# Patient Record
Sex: Male | Born: 1955 | Race: White | Hispanic: No | Marital: Married | Smoking: Former smoker
Health system: Southern US, Community
[De-identification: ages and names within clinical notes are randomized; demographics above are authoritative.]

## PROBLEM LIST (undated history)

## (undated) DIAGNOSIS — K219 Gastro-esophageal reflux disease without esophagitis: Secondary | ICD-10-CM

## (undated) DIAGNOSIS — J8489 Other specified interstitial pulmonary diseases: Secondary | ICD-10-CM

## (undated) DIAGNOSIS — J984 Other disorders of lung: Secondary | ICD-10-CM

## (undated) DIAGNOSIS — R413 Other amnesia: Secondary | ICD-10-CM

## (undated) DIAGNOSIS — E119 Type 2 diabetes mellitus without complications: Secondary | ICD-10-CM

## (undated) DIAGNOSIS — R0602 Shortness of breath: Secondary | ICD-10-CM

## (undated) DIAGNOSIS — M810 Age-related osteoporosis without current pathological fracture: Secondary | ICD-10-CM

## (undated) DIAGNOSIS — J189 Pneumonia, unspecified organism: Secondary | ICD-10-CM

## (undated) DIAGNOSIS — M069 Rheumatoid arthritis, unspecified: Secondary | ICD-10-CM

## (undated) DIAGNOSIS — J309 Allergic rhinitis, unspecified: Secondary | ICD-10-CM

## (undated) DIAGNOSIS — Z860100 Personal history of colon polyps, unspecified: Secondary | ICD-10-CM

## (undated) DIAGNOSIS — R319 Hematuria, unspecified: Secondary | ICD-10-CM

## (undated) DIAGNOSIS — J449 Chronic obstructive pulmonary disease, unspecified: Secondary | ICD-10-CM

## (undated) DIAGNOSIS — R4182 Altered mental status, unspecified: Secondary | ICD-10-CM

## (undated) DIAGNOSIS — E018 Other iodine-deficiency related thyroid disorders and allied conditions: Secondary | ICD-10-CM

## (undated) DIAGNOSIS — M542 Cervicalgia: Secondary | ICD-10-CM

## (undated) DIAGNOSIS — F411 Generalized anxiety disorder: Secondary | ICD-10-CM

## (undated) DIAGNOSIS — F17200 Nicotine dependence, unspecified, uncomplicated: Secondary | ICD-10-CM

## (undated) DIAGNOSIS — I6990 Unspecified sequelae of unspecified cerebrovascular disease: Secondary | ICD-10-CM

## (undated) DIAGNOSIS — E7800 Pure hypercholesterolemia, unspecified: Secondary | ICD-10-CM

## (undated) DIAGNOSIS — K2970 Gastritis, unspecified, without bleeding: Secondary | ICD-10-CM

## (undated) DIAGNOSIS — Z7289 Other problems related to lifestyle: Secondary | ICD-10-CM

## (undated) DIAGNOSIS — H532 Diplopia: Secondary | ICD-10-CM

## (undated) DIAGNOSIS — E0590 Thyrotoxicosis, unspecified without thyrotoxic crisis or storm: Secondary | ICD-10-CM

## (undated) DIAGNOSIS — I739 Peripheral vascular disease, unspecified: Secondary | ICD-10-CM

## (undated) DIAGNOSIS — R7309 Other abnormal glucose: Secondary | ICD-10-CM

## (undated) DIAGNOSIS — I639 Cerebral infarction, unspecified: Secondary | ICD-10-CM

## (undated) DIAGNOSIS — E039 Hypothyroidism, unspecified: Secondary | ICD-10-CM

## (undated) HISTORY — DX: Allergic rhinitis, unspecified: J30.9

## (undated) HISTORY — DX: Age-related osteoporosis without current pathological fracture: M81.0

## (undated) HISTORY — DX: Peripheral vascular disease, unspecified: I73.9

## (undated) HISTORY — DX: Diplopia: H53.2

## (undated) HISTORY — DX: Hypothyroidism, unspecified: E03.9

## (undated) HISTORY — DX: Pneumonia, unspecified organism: J18.9

## (undated) HISTORY — DX: Generalized anxiety disorder: F41.1

## (undated) HISTORY — DX: Gastritis, unspecified, without bleeding: K29.70

## (undated) HISTORY — PX: OTHER SURGICAL HISTORY: SHX169

## (undated) HISTORY — DX: Other iodine-deficiency related thyroid disorders and allied conditions: E01.8

## (undated) HISTORY — DX: Personal history of colonic polyps: Z86.010

## (undated) HISTORY — DX: Nicotine dependence, unspecified, uncomplicated: F17.200

## (undated) HISTORY — DX: Hematuria, unspecified: R31.9

## (undated) HISTORY — DX: Type 2 diabetes mellitus without complications: E11.9

## (undated) HISTORY — DX: Other disorders of lung: J98.4

## (undated) HISTORY — DX: Other problems related to lifestyle: Z72.89

## (undated) HISTORY — DX: Gastro-esophageal reflux disease without esophagitis: K21.9

## (undated) HISTORY — DX: Personal history of colon polyps, unspecified: Z86.0100

## (undated) HISTORY — DX: Pure hypercholesterolemia, unspecified: E78.00

## (undated) HISTORY — DX: Cervicalgia: M54.2

## (undated) HISTORY — DX: Other amnesia: R41.3

## (undated) HISTORY — DX: Altered mental status, unspecified: R41.82

## (undated) HISTORY — DX: Other abnormal glucose: R73.09

## (undated) HISTORY — DX: Unspecified sequelae of unspecified cerebrovascular disease: I69.90

---

## 2003-08-23 DIAGNOSIS — I639 Cerebral infarction, unspecified: Secondary | ICD-10-CM

## 2003-08-23 HISTORY — DX: Cerebral infarction, unspecified: I63.9

## 2003-08-23 HISTORY — PX: BRAIN SURGERY: SHX531

## 2004-03-29 ENCOUNTER — Inpatient Hospital Stay (HOSPITAL_COMMUNITY): Admission: EM | Admit: 2004-03-29 | Discharge: 2004-04-21 | Payer: Self-pay | Admitting: Emergency Medicine

## 2004-09-27 ENCOUNTER — Ambulatory Visit (HOSPITAL_BASED_OUTPATIENT_CLINIC_OR_DEPARTMENT_OTHER): Admission: RE | Admit: 2004-09-27 | Discharge: 2004-09-27 | Payer: Self-pay | Admitting: Ophthalmology

## 2005-02-10 ENCOUNTER — Encounter: Admission: RE | Admit: 2005-02-10 | Discharge: 2005-04-27 | Payer: Self-pay | Admitting: Neurosurgery

## 2005-04-27 ENCOUNTER — Ambulatory Visit (HOSPITAL_COMMUNITY): Admission: RE | Admit: 2005-04-27 | Discharge: 2005-04-27 | Payer: Self-pay | Admitting: Neurosurgery

## 2006-03-17 ENCOUNTER — Ambulatory Visit (HOSPITAL_COMMUNITY): Admission: RE | Admit: 2006-03-17 | Discharge: 2006-03-17 | Payer: Self-pay | Admitting: Neurosurgery

## 2007-03-16 DIAGNOSIS — R319 Hematuria, unspecified: Secondary | ICD-10-CM

## 2007-03-16 DIAGNOSIS — F17200 Nicotine dependence, unspecified, uncomplicated: Secondary | ICD-10-CM

## 2007-03-16 DIAGNOSIS — Z7289 Other problems related to lifestyle: Secondary | ICD-10-CM

## 2007-03-16 DIAGNOSIS — F1090 Alcohol use, unspecified, uncomplicated: Secondary | ICD-10-CM

## 2007-03-16 HISTORY — DX: Other problems related to lifestyle: Z72.89

## 2007-03-16 HISTORY — DX: Alcohol use, unspecified, uncomplicated: F10.90

## 2007-03-16 HISTORY — DX: Nicotine dependence, unspecified, uncomplicated: F17.200

## 2007-03-16 HISTORY — DX: Hematuria, unspecified: R31.9

## 2007-06-08 DIAGNOSIS — I6990 Unspecified sequelae of unspecified cerebrovascular disease: Secondary | ICD-10-CM

## 2007-06-08 HISTORY — DX: Unspecified sequelae of unspecified cerebrovascular disease: I69.90

## 2007-09-17 DIAGNOSIS — M542 Cervicalgia: Secondary | ICD-10-CM

## 2007-09-17 HISTORY — DX: Cervicalgia: M54.2

## 2007-10-05 ENCOUNTER — Encounter: Admission: RE | Admit: 2007-10-05 | Discharge: 2007-10-05 | Payer: Self-pay | Admitting: Specialist

## 2007-10-12 ENCOUNTER — Encounter: Admission: RE | Admit: 2007-10-12 | Discharge: 2007-10-12 | Payer: Self-pay | Admitting: Specialist

## 2007-10-25 ENCOUNTER — Ambulatory Visit: Payer: Self-pay | Admitting: Family Medicine

## 2007-12-21 DIAGNOSIS — H532 Diplopia: Secondary | ICD-10-CM

## 2007-12-21 HISTORY — DX: Diplopia: H53.2

## 2008-04-08 DIAGNOSIS — R7309 Other abnormal glucose: Secondary | ICD-10-CM

## 2008-04-08 HISTORY — DX: Other abnormal glucose: R73.09

## 2008-06-13 ENCOUNTER — Encounter: Admission: RE | Admit: 2008-06-13 | Discharge: 2008-06-13 | Payer: Self-pay | Admitting: Specialist

## 2008-08-05 ENCOUNTER — Emergency Department (HOSPITAL_COMMUNITY): Admission: EM | Admit: 2008-08-05 | Discharge: 2008-08-06 | Payer: Self-pay | Admitting: Emergency Medicine

## 2008-08-22 HISTORY — PX: EYE SURGERY: SHX253

## 2008-09-16 ENCOUNTER — Ambulatory Visit: Payer: Self-pay | Admitting: Gastroenterology

## 2008-09-30 DIAGNOSIS — Z860100 Personal history of colon polyps, unspecified: Secondary | ICD-10-CM | POA: Insufficient documentation

## 2009-01-23 DIAGNOSIS — J984 Other disorders of lung: Secondary | ICD-10-CM

## 2009-01-23 HISTORY — DX: Other disorders of lung: J98.4

## 2009-02-13 ENCOUNTER — Ambulatory Visit: Payer: Self-pay | Admitting: Family Medicine

## 2009-04-23 ENCOUNTER — Ambulatory Visit: Payer: Self-pay | Admitting: Gastroenterology

## 2009-05-13 DIAGNOSIS — K2970 Gastritis, unspecified, without bleeding: Secondary | ICD-10-CM

## 2009-05-13 HISTORY — DX: Gastritis, unspecified, without bleeding: K29.70

## 2009-06-12 ENCOUNTER — Ambulatory Visit: Payer: Self-pay | Admitting: Family Medicine

## 2009-09-14 ENCOUNTER — Ambulatory Visit: Payer: Self-pay | Admitting: Pain Medicine

## 2009-09-21 ENCOUNTER — Ambulatory Visit: Payer: Self-pay | Admitting: Pain Medicine

## 2009-10-08 ENCOUNTER — Ambulatory Visit: Payer: Self-pay | Admitting: Pain Medicine

## 2009-12-24 DIAGNOSIS — I739 Peripheral vascular disease, unspecified: Secondary | ICD-10-CM

## 2009-12-24 HISTORY — DX: Peripheral vascular disease, unspecified: I73.9

## 2010-01-07 DIAGNOSIS — E039 Hypothyroidism, unspecified: Secondary | ICD-10-CM

## 2010-01-07 HISTORY — DX: Hypothyroidism, unspecified: E03.9

## 2010-06-14 ENCOUNTER — Ambulatory Visit: Payer: Self-pay

## 2010-06-22 ENCOUNTER — Inpatient Hospital Stay: Payer: Self-pay | Admitting: Internal Medicine

## 2010-07-19 ENCOUNTER — Inpatient Hospital Stay: Payer: Self-pay | Admitting: Internal Medicine

## 2011-01-07 NOTE — Op Note (Signed)
Kirk Robinson, Kirk Robinson                         ACCOUNT NO.:  [id number]   MEDICAL RECORD NO.:  [id number]                   PATIENT TYPE:  INP   LOCATION:  3021                                 FACILITY:  [facility]   PHYSICIAN:  Coletta Memos, M.D.                  DATE OF BIRTH:  [birth year]   DATE OF PROCEDURE:  04/22/2004  DATE OF DISCHARGE:  04/21/2004                                 OPERATIVE REPORT   PREOPERATIVE DIAGNOSES:  1.  Obstructive hydrocephalus.  2.  Left thalamic intracerebral hemorrhage.   POSTOPERATIVE DIAGNOSES:  1.  Obstructive hydrocephalus.  2.  Left thalamic intracerebral hemorrhage.   OPERATION PERFORMED:  Creation of left frontal ventriculoperitoneal shunt  using Codman programmable valve, antibiotic impregnated peritoneal catheter.   SURGEON:  Coletta Memos, M.D.   ASSISTANT:  Hilda Lias, M.D.   ANESTHESIA:  General.   INDICATIONS FOR PROCEDURE:  Kirk Robinson is a gentleman who presented in  early August with a left thalamic intracerebral hemorrhage.  As a result, he  had obstruction of the third ventricle necessitating ventricular catheter  placement and spinal fluid drainage.  He has not been able to do without the  ventricular catheter, so I therefore decided to place a left frontal  ventriculoperitoneal shunt.  Secondary to the low pressure, he needed to be  asymptomatic which was 0 cm H2O.  I decided to use a programmable shunt and  I set it at 90 initially.   DESCRIPTION OF PROCEDURE:  The patient was brought to the operating room  intubated and placed under general anesthetic.  He was positioned with his  hear turned towards the right and a shoulder roll under the left shoulder  and his chest, abdomen, neck and head were prepped with DuraPrep.  He was  draped sterilely.  He was infiltrated with 0.5% lidocaine 1:200,000 strength  epinephrine in the scalp postauricular region and just off the midline in  the left upper quadrant.  Incision was  made in the abdomen and I went  through the various layers of the superficial skin.  Then went through  Camper's fascia.  I identified the rectus abdominis.  I was able to retract  the side of that laterally.  I then identified the posterior rectus sheath,  opened that.  Peritoneum was identified and I opened that.  Then I saw  intraperitoneal contents.  I placed straight snaps on the end of the  peritoneum to identify it.  Then I turned my attention to the head.  Dr.  Jeral Fruit was assisting during this time.  I opened a semicircular flap on the  scalp anterior to the coronal suture in the midpupillary line.  I created a  bur hole and passed the catheter.  I had brisk return of spinal fluid.  We  then tunneled a catheter from the scalp incision down through the neck where  I had to make another  incision in the neck to fully tunnel it subcutaneously  to the abdomen.  In the process of trying to connect the catheter to the  valve which had been programmed to a pressure of 90 cmH2O, I was unable to  achieve spontaneous flow.  I checked the catheter again and there was  spontaneous flow again.  Once more attempted to connect it to the valve and  was unsuccessful.  I then used another catheter and placed that and was able  to achieve flow but again had trouble connecting the valve.  So at that time  I elected to use a different design valve and that was set to 90 also.  This  was fashioned so that the Rickham reservoir was directly over the catheter.  I was able to place that and was able to aspirate spinal fluid from the  Rickham reservoir but did not achieve spontaneous flow from the abdominal  wound although I was able to aspirate spinal fluid from the abdominal end  via the intraventricular catheter.  I decided at that time that I should  simply go ahead and place the catheter intraperitoneally and close the  incision.  A CT was begun after the operation to ensure that the catheter  was in the  correct location and that the ventricles were still decompressed.  I then closed the wounds with Dr. Cassandria Santee assistance, on the scalp, two on  the neck and then the abdomen.  They were all closed with Vicryl sutures  then in layers.  Subcutaneous closure of the abdominal incision.  Staples  were used on the scalp, on the neck, and sterile dressings were applied.  The patient tolerated the procedure well, was awakened, moving all  extremities and had a good neurologic examination which was unchanged from  his preop state.                                               Coletta Memos, M.D.    KC/MEDQ  D:  04/22/2004  T:  04/23/2004  Job:  [id number]

## 2011-01-07 NOTE — Discharge Summary (Signed)
[name], [name]             ACCOUNT NO.:  [id number]   MEDICAL RECORD NO.:  [id number]          PATIENT TYPE:  INP   LOCATION:  3021                         FACILITY:  [facility]   PHYSICIAN:  Coletta Memos, M.D.     DATE OF BIRTH:  12/05/1955   DATE OF ADMISSION:  03/29/2004  DATE OF DISCHARGE:  04/21/2004                                 DISCHARGE SUMMARY   ADMITTING DIAGNOSES:  1.  Left thalamic intracerebral hematoma.  2.  Obstructive hydrocephalus.   DISCHARGE DIAGNOSES:  1.  Left thalamic intracerebral hematoma.  2.  Obstructive hydrocephalus.   PROCEDURE:  1.  Placement of right frontal ventricular catheter.  2.  Placement of right frontal ventriculoperitoneal shunt.   COMPLICATIONS:  None.   DISCHARGE STATUS:  Alive and well.   DESTINATION:  Home.   INDICATIONS:  Kirk Robinson is a 55 year old who was admitted after  suffering a headache while at work.  CT and MRI showed a thalamic hemorrhage  on the left side with obstructive hydrocephalus.  He underwent an angiogram  which was negative for an AVM or any other vascular lesion.  He had two MRIs  while in the hospital both done with contrast, neither one showing any  evidence of a lesion in the left thalamus.  He had no obvious etiology for  the hemorrhage.   Initially, Kirk Robinson had quite poor coordination with his right upper  extremity, and he was confused at times and had headaches.  He was very  dependent on the ventricular catheter and frankly when his catheter was a  pressure of 10, he had problems and it needed to be lowered to 0 essentially  for him to perk up.  He did perk up and at that time I made a decision to  place a ventriculoperitoneal shunt which was __________  programmable valve.  He had the shunt placed and then did well afterwards.   He is due to see me for a follow up in approximately one month.  Overall  doing well.  At discharge his wounds were clean, dry, and without signs of  infection.     KC/MEDQ  D:  05/21/2004  T:  05/22/2004  Job:  [id number]

## 2011-01-07 NOTE — Op Note (Signed)
NAMEDECKLAN, MAU                         ACCOUNT NO.:  [id number]   MEDICAL RECORD NO.:  [id number]                   PATIENT TYPE:  INP   LOCATION:  3113                                 FACILITY:  [facility]   PHYSICIAN:  Coletta Memos, M.D.                  DATE OF BIRTH:  08-Jul-1956   DATE OF PROCEDURE:  03/29/2004  DATE OF DISCHARGE:                                 OPERATIVE REPORT   PREOPERATIVE DIAGNOSIS:  Ventriculomegaly, obstructive.   POSTOPERATIVE DIAGNOSIS:  Obstructive hydrocephalus.   OPERATION PERFORMED:  Right frontal ventricular catheter placement.   SURGEON:  Coletta Memos, M.D.   ANESTHESIA:  Local.   INDICATIONS FOR PROCEDURE:  Kirk Robinson isa 55 year old admitted with a  third ventricular lesion which is high density on CT.  Secondary to an  episode of emesis while obtaining an angiogram, I have decided to go ahead  and place a ventricular catheter.  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   DESCRIPTION OF PROCEDURE:  Mr. Bosket was sedated with Versed and morphine  was given for pain control.  I prepped his head in a sterile fashion.  I  infiltrated lidocaine through the scalp in the midpupillary line at  approximately 4 cm posterior to that.  I then opened the scalp with a #11  blade.  I then drilled a bur hole using a hand twist drill.  I then made  sure that I had access through the dura.  I then placed a ventricular  catheter approximately 7 cm into the ventricle.  Fluid was obtained and it  was draining under its own power.  The fluid column was pulsatile.  I then  tunneled the catheter underneath the skin.  I then closed the frontal scalp  incision. I  then placed a suture to secure the catheter the tunnel site.  I  then connected the catheter to a drainage system.  The patient tolerated the  procedure well. Fluid was draining without difficulty.                                               Coletta Memos, M.D.    KC/MEDQ   D:  03/29/2004  T:  03/30/2004  Job:  [id number]

## 2011-01-07 NOTE — Discharge Summary (Signed)
[name], [name]                         ACCOUNT NO.:  [id number]   MEDICAL RECORD NO.:  [id number]                   PATIENT TYPE:  INP   LOCATION:  3021                                 FACILITY:  [facility]   PHYSICIAN:  Coletta Memos, M.D.                  DATE OF BIRTH:  [birth year]   DATE OF ADMISSION:  03/29/2004  DATE OF DISCHARGE:  04/21/2004                                 DISCHARGE SUMMARY   ADMISSION DIAGNOSIS:  Left thalamic intracerebral hemorrhage with extension  into the mid brain.   DISCHARGE DIAGNOSIS:  1.  Left thalamic intracerebral hemorrhage with extension into the mid      brain.  2.  Third nerve palsy.  3.  Obstructive hydrocephalus.   PROCEDURE:  1.  Placement of right frontal intraventricular catheter on March 29, 2004.  2.  April 16, 2004, placement of left frontal ventriculoperitoneal shunt.   HOSPITAL COURSE:  Kirk Robinson presented to the hospital on March 29, 2004, with a history of headache, dizziness, and almost losing consciousness  while he was at work.  He had some problems with his left eye and the vision  in that eye.  There was an acute event.  There was no build up to this  whatsoever.  He had no history of headaches and he had never had symptoms  like this in the past.  Upon admission, he had pupillary inequality, left  pupil dilated to approximately 6 mm.  He had extraocular movements not full  and a mildly responsive right pupil which was about 2 mm.  Head CT showed a  large amount of blood which was located in the left thalamus.  It was not  clear initially whether or not this was intraventricular or only in the  thalamus.  I had him undergo an angiogram on the day of admission and that  showed no vascular lesion.  He then had an MRI done on March 30, 2004, which  showed no underlying tumor mass, AVM, or occult vascular malformation.  I  placed a ventricular catheter secondary to increasing ventriculomegaly on  his initial CT.  He  did well with drainage and subsequently had improvement  in his neurologic exam.  He was, at times, initially confused and that  improved steadily throughout his hospitalization.  He always had good  strength on both sides, but coordination was somewhat poor initially and  certainly improved at discharge.  He was cleared by physical therapy at  discharge.  They did recommend he undergo some possible visual rehab for the  third nerve palsy that he has in his left eye.  He had placement of a  ventriculoperitoneal shunt secondary to persistent need for ventricular  drainage.  There were two occasions where he had to have his ventricular  drainage from first 15 cm to 10 cm, then from 10 cm to 0 cm, in order to  effect decompression of the ventricles and improvement in his neurologic  exam.  His blood pressure remained normal throughout his entire  hospitalization.  I will see him back in the office in approximately one  week for staple removal.  He had some mild erythema around the abdominal  incision for his shunt.  I will send him home with some Bactrim to take over  the next ten days.                                                Coletta Memos, M.D.    KC/MEDQ  D:  04/21/2004  T:  04/22/2004  Job:  [id number]
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
## 2011-01-07 NOTE — Op Note (Signed)
NAMEEDWARDO, WOJNAROWSKI             ACCOUNT NO.:  [id number]   MEDICAL RECORD NO.:  [id number]          PATIENT TYPE:  AMB   LOCATION:  DSC                          FACILITY:  [facility]   PHYSICIAN:  Casimiro Needle A. Karleen Hampshire, M.D.DATE OF BIRTH:  [birth year]   DATE OF PROCEDURE:  09/27/2004  DATE OF DISCHARGE:                                 OPERATIVE REPORT   PREOPERATIVE DIAGNOSES:  Left third nerve paresis with diplopia.   POSTOPERATIVE DIAGNOSES:  Status post extraocular muscle surgery, OU.   PROCEDURE:  Left inferior rectus resection of 6 mm on adjustable suture,  left superior rectus resection of 5 mm.  A right inferior oblique tuck of 5  mm, a right superior oblique resection of 5 mm. A right lateral rectus  resection on adjustable suture of 1 mm.   SURGEON:  Tyrone Apple. Karleen Hampshire, M.D.   ANESTHESIA:  General with laryngeal mask airway.   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 The patient was  examined on serial examination and the deviation remained fixed and did not  evolve any after the eight month period. This procedure is indicated to  restore single binocular vision and resolve diplopia. The risks and benefits  of the procedure are explained to the patient and the patient's family.  Informed consent was obtained prior to the procedure.   DESCRIPTION OF PROCEDURE:  The patient was taken to the operating room,  placed in the supine position, the entire face was prepped and draped in the  usual sterile manner.  Our attention was first turned to the right eye,  forced duction test was performed and found to be negative. The globe was  then held in the superior temporal quadrant and the eye was then depressed  and adducted, an incision was  made through the superior temporal fornix,  taken down through the posterior subtenon space and the right superior  rectus muscle was then isolated on a Stevens hook and subsequently on a  Green hook.  The second Green hook was passed beneath the tendon of the  muscle, This was then used to hold the globe in an elevated and abducted  position.  Dissection was then carried posteriorly over the tendon of the  superior rectus muscle to expose the superior oblique fibers coursing  inferior to the superior rectus tendon. The lid speculum was then removed  and the Demarres hook was then placed in the incision site and the superior  oblique fibers were then carefully isolated on a Stevens hook and brought  into the dissection field. The superior fiber was then placed on a green  tendon hook and a small to moderate retractor was then replaced with a  larger retractor to get exposure to the operating field. A mark was then  placed on the superior oblique tendon at 5 mm from its insertion and the  tendon was then  imbricated on 6-0 Vicryl sutures taking two locking bites at  the ends as near to its insertion as possible.  It was then detached in the  globe and recessed at a point 5 mm from its insertion, reattached the globe  using the preplaced sutures.  The conjunctiva was repositioned and attention  was then turned to the right inferior oblique. The globe was then held in  the superior temporal quadrant.  The eye was then elevated and adducted,  incisions made through the inferior temporal, fornix taken down to the  posterior subtenon space and the left lateral rectus was then isolated on a  Stevens hook subsequently. This Green hook was then used to hold the eye in  an elevated and adducted position. The inferior oblique was then isolated  coursing from its origin in the anterior floor of the orbits at its  insertion at the posterior inferior temporal quadrant of the globe.  It was  isolated  carefully on two Steven's hooks and carefully dissected free from  its overlying muscle fascia and muscle septum to its insertion at the  posterior inferior temporal quadrant of the globe.  It was then exposed and  held on a Stevens hook. A Demarres retractor was placed in the incision site  and the tendon was then imbricated at its insertion on 6-0 Vicryl suture  taking locking bites at the end.  The suture was then passed through the  belly of the tendon at a distance of 5 mm from this initial imbrication.  The inferior oblique tendon was then tucked to itself and the sutures tied .  The instruments were removed and the conjunctiva was repositioned. Via the  same incision, the right lateral rectus muscle was isolated on a Stevens  hook and subsequently a Green hook.  It was then carefully dissected free  from its overlying muscle fascia and intramuscular septum.  It was then  imbricated on 6-0 Vicryl suture taking two locking bites at the ends and  detaching the insertion. We reattached the globe using the preplaced sutures  and reattached in adjustable suture fashion at approximately 1 mm from its  need of insertion. The sutures were then repositioned on the conjunctiva and  our attention was then turned to the fellow left eye. Four suction test were  performed and found to be negative. The globe was then held in the superior  temporal corner, the eye was depressed and adducted. An incision was made to  the superior temporal quadrant, taken down to the posterior subtenon space  the superior rectus muscle was then isolated on a Stevens hook, subsequently  a Green hook. A second Green hook was then passed in the tendon of the  muscle and the muscle was carefully dissected free from its overlying muscle  fascia and from the muscular septum.  It was then carefully imbricated on 6- 0 Vicryl suture taking two locking bites of the ends. It was then detached  from the globe and resected exactly 5  mm from its insertion. It was  reattached to the globe using the preplaced sutures.  The conjunctiva was  repositioned, our attention was then turned to the inferior rectus muscle.  The globe was held in the inferior temporal quadrant, the eye was elevated  and adducted, incisions made through the inferior temporal fornix, taken  down to the posterior subtenon space and the left inferior rectus muscle was  then carefully isolated on a Stevens hook and subsequently on a [employer]  hook.  It was then carefully dissected free from its overlying muscle fascia and  anterior muscular septum for a distance of approximately 13 mm posteriorly  dissecting free the capsular palpebral fascia. A second Green hook was then  passed in the tendon of the muscle and a mark was placed on the tendon at 6  mm from its insertion.  The tendon was then carefully imbricated at this  point taking two locking bites off the ends. It was then transected proximal  to the preplaced sutures and advanced to the native insertion site,  reattached to the globe at the preplaced sutures and tied in adjustable  suture fashion.  At the conclusion of the procedure, a double pressure patch  was applied to the right eye and the sutures from the left eye were  deposited underneath the palpebral fornix. There were no apparent  complications.  The patient was subsequently transferred from the operating  room to the recovery room awake and in stable condition. The patient was  subsequently adjusted to orthophoria in the minor surgery room one hour  after complete recovery.      MAS/MEDQ  D:  09/27/2004  T:  09/27/2004  Job:  [id number]

## 2011-05-27 LAB — POCT CARDIAC MARKERS
CKMB, poc: <1 ng/mL — ABNORMAL LOW (ref 1.0–8.0)
CKMB, poc: <1 ng/mL — ABNORMAL LOW (ref 1.0–8.0)
Myoglobin, poc: 43.7 ng/mL (ref 12–200)
Myoglobin, poc: 57.7 ng/mL (ref 12–200)
Troponin i, poc: <0.05 ng/mL (ref 0.00–0.09)
Troponin i, poc: <0.05 ng/mL (ref 0.00–0.09)

## 2011-05-27 LAB — POCT I-STAT, CHEM 8
BUN: 10 mg/dL (ref 6–23)
Calcium, Ion: 1.13 mmol/L (ref 1.12–1.32)
Chloride: 103 mEq/L (ref 96–112)
Creatinine, Ser: 1.1 mg/dL (ref 0.4–1.5)
Glucose, Bld: 108 mg/dL — ABNORMAL HIGH (ref 70–99)
HCT: 41 % (ref 39.0–52.0)
Hemoglobin: 13.9 g/dL (ref 13.0–17.0)
Potassium: 3.8 mEq/L (ref 3.5–5.1)
Sodium: 141 mEq/L (ref 135–145)
TCO2: 29 mmol/L (ref 0–100)

## 2011-06-06 ENCOUNTER — Emergency Department: Payer: Self-pay | Admitting: Emergency Medicine

## 2011-07-21 ENCOUNTER — Inpatient Hospital Stay: Payer: Self-pay | Admitting: *Deleted

## 2011-11-20 ENCOUNTER — Emergency Department: Payer: Self-pay | Admitting: Emergency Medicine

## 2011-11-20 LAB — CBC WITH DIFFERENTIAL/PLATELET
Basophil #: 0.1 10*3/uL (ref 0.0–0.1)
Basophil %: 0.6 %
Eosinophil #: 0.1 10*3/uL (ref 0.0–0.7)
Eosinophil %: 1.2 %
HCT: 46.5 % (ref 40.0–52.0)
HGB: 16 g/dL (ref 13.0–18.0)
Lymphocyte #: 4.2 10*3/uL — ABNORMAL HIGH (ref 1.0–3.6)
Lymphocyte %: 38.3 %
MCH: 31 pg (ref 26.0–34.0)
MCHC: 34.4 g/dL (ref 32.0–36.0)
MCV: 90 fL (ref 80–100)
Monocyte #: 0.6 10*3/uL (ref 0.0–0.7)
Monocyte %: 5.9 %
Neutrophil #: 5.9 10*3/uL (ref 1.4–6.5)
Neutrophil %: 54 %
Platelet: 521 10*3/uL — ABNORMAL HIGH (ref 150–440)
RBC: 5.15 10*6/uL (ref 4.40–5.90)
RDW: 13.6 % (ref 11.5–14.5)
WBC: 10.9 10*3/uL — ABNORMAL HIGH (ref 3.8–10.6)

## 2011-11-20 LAB — DRUG SCREEN, URINE
Amphetamines, Ur Screen: NEGATIVE (ref ?–1000)
Barbiturates, Ur Screen: NEGATIVE (ref ?–200)
Benzodiazepine, Ur Scrn: NEGATIVE (ref ?–200)
Cannabinoid 50 Ng, Ur ~~LOC~~: NEGATIVE (ref ?–50)
Cocaine Metabolite,Ur ~~LOC~~: NEGATIVE (ref ?–300)
MDMA (Ecstasy)Ur Screen: NEGATIVE (ref ?–500)
Methadone, Ur Screen: NEGATIVE (ref ?–300)
Opiate, Ur Screen: POSITIVE (ref ?–300)
Phencyclidine (PCP) Ur S: NEGATIVE (ref ?–25)
Tricyclic, Ur Screen: NEGATIVE (ref ?–1000)

## 2011-11-20 LAB — COMPREHENSIVE METABOLIC PANEL
Albumin: 3.7 g/dL (ref 3.4–5.0)
Alkaline Phosphatase: 76 U/L (ref 50–136)
Anion Gap: 14 (ref 7–16)
BUN: 9 mg/dL (ref 7–18)
Bilirubin,Total: 0.6 mg/dL (ref 0.2–1.0)
Calcium, Total: 9.5 mg/dL (ref 8.5–10.1)
Chloride: 103 mmol/L (ref 98–107)
Co2: 24 mmol/L (ref 21–32)
Creatinine: 0.92 mg/dL (ref 0.60–1.30)
EGFR (African American): >60
EGFR (Non-African Amer.): >60
Glucose: 112 mg/dL — ABNORMAL HIGH (ref 65–99)
Osmolality: 281 (ref 275–301)
Potassium: 3.3 mmol/L — ABNORMAL LOW (ref 3.5–5.1)
SGOT(AST): 23 U/L (ref 15–37)
SGPT (ALT): 16 U/L
Sodium: 141 mmol/L (ref 136–145)
Total Protein: 8 g/dL (ref 6.4–8.2)

## 2011-11-20 LAB — URINALYSIS, COMPLETE
Bacteria: NONE SEEN
Bilirubin,UR: NEGATIVE
Glucose,UR: NEGATIVE mg/dL (ref 0–75)
Ketone: NEGATIVE
Leukocyte Esterase: NEGATIVE
Nitrite: NEGATIVE
Ph: 8 (ref 4.5–8.0)
Protein: NEGATIVE
RBC,UR: 2 /HPF (ref 0–5)
Specific Gravity: 1.006 (ref 1.003–1.030)
Squamous Epithelial: NONE SEEN
WBC UR: 1 /HPF (ref 0–5)

## 2011-11-20 LAB — TSH: Thyroid Stimulating Horm: 2.36 u[IU]/mL

## 2011-11-20 LAB — SEDIMENTATION RATE: Erythrocyte Sed Rate: 16 mm/hr (ref 0–20)

## 2011-11-20 LAB — CK TOTAL AND CKMB (NOT AT ARMC)
CK, Total: 28 U/L — ABNORMAL LOW (ref 35–232)
CK-MB: <0.5 ng/mL — ABNORMAL LOW (ref 0.5–3.6)

## 2011-11-20 LAB — TROPONIN I: Troponin-I: <0.02 ng/mL

## 2012-02-27 LAB — LIPID PANEL
Cholesterol: 175 mg/dL (ref 0–200)
HDL: 34 mg/dL — AB (ref 35–70)
LDL Cholesterol: 88 mg/dL
LDl/HDL Ratio: 2.6
Triglycerides: 263 mg/dL — AB (ref 40–160)

## 2012-02-27 LAB — HEMOGLOBIN A1C: Hgb A1c MFr Bld: 6.2 % — AB (ref 4.0–6.0)

## 2012-02-27 LAB — BASIC METABOLIC PANEL: Glucose: 102 mg/dL

## 2012-04-01 ENCOUNTER — Encounter: Payer: Self-pay | Admitting: Family Medicine

## 2012-04-04 ENCOUNTER — Encounter: Payer: Self-pay | Admitting: *Deleted

## 2012-04-09 ENCOUNTER — Ambulatory Visit: Payer: Self-pay | Admitting: Family Medicine

## 2012-04-16 ENCOUNTER — Ambulatory Visit (INDEPENDENT_AMBULATORY_CARE_PROVIDER_SITE_OTHER): Payer: 59 | Admitting: Family Medicine

## 2012-04-16 ENCOUNTER — Encounter: Payer: Self-pay | Admitting: Family Medicine

## 2012-04-16 VITALS — BP 115/72 | HR 64 | Temp 97.8°F | Resp 16 | Ht 70.0 in | Wt 157.0 lb

## 2012-04-16 DIAGNOSIS — E7800 Pure hypercholesterolemia, unspecified: Secondary | ICD-10-CM

## 2012-04-16 DIAGNOSIS — E785 Hyperlipidemia, unspecified: Secondary | ICD-10-CM

## 2012-04-16 DIAGNOSIS — R4182 Altered mental status, unspecified: Secondary | ICD-10-CM

## 2012-04-16 DIAGNOSIS — F329 Major depressive disorder, single episode, unspecified: Secondary | ICD-10-CM

## 2012-04-16 DIAGNOSIS — M6281 Muscle weakness (generalized): Secondary | ICD-10-CM

## 2012-04-16 DIAGNOSIS — F32A Depression, unspecified: Secondary | ICD-10-CM

## 2012-04-16 NOTE — Progress Notes (Signed)
Subjective:    Patient ID: Kirk Robinson, male    DOB: 1956/04/07, 56 y.o.   MRN: [id number]  HPIThis 56 y.o. male presents to establish care and for six week follow-up:  1.  Altered Mental Status:  S/p neuropsychiatric evaluation Tom Bundick in [place] last week; one week ago.  Results will be back in 7-10 days.  Did not have sleep study; recommended stopping Zoloft and felt terrible.  Unable to stop Zoloft and started feeling much better.  Felt really sick.  Was miserable.  Neuropsychiatrist recommended sleep study.  No follow-up appointment with Bundick; will have conference call for phone consultation.  9:00-2:30; half hour break.  Really thinks he did poorly on memory.  Depth perception part was really tough. Has not started disability process but plans to start.  Currently on short-term disability through work; six month duration of short-term disability.  Work is being cooperative.  Has not worked since April 2013 since hospitalization; is approved since 04/22/12.  Job physically demanding; must be on feet most of the day.  Operates laser at work; has learned to operate laser by feel since eye issues with diplopia.  Continues to suffer with poor recall of words; +continued delayed reaction time; no improvement since last visit.  Continues to sleep walk.    2.  Weakness: held Lipitor for past one month.  Was taking 1/2 tablet daily prior to holding medication.  Weakness in legs did not improve.  3.  Hyperlipidemia:  Last visit six weeks ago; management changes at last visit included holding medication due to leg weakness.  Last labs six weeks ago and LDL controlled.     Review of Systems  Constitutional: Positive for fatigue. Negative for fever, chills, diaphoresis and unexpected weight change.  Eyes: Positive for visual disturbance. Negative for photophobia.  Respiratory: Negative for shortness of breath.   Cardiovascular: Negative for chest pain, palpitations and leg swelling.    Musculoskeletal: Positive for myalgias, back pain and arthralgias.  Neurological: Positive for weakness. Negative for dizziness, tremors, seizures, syncope, facial asymmetry, speech difficulty, light-headedness, numbness and headaches.  Psychiatric/Behavioral: Negative for suicidal ideas, behavioral problems, confusion, disturbed wake/sleep cycle, self-injury, dysphoric mood, decreased concentration and agitation. The patient is not nervous/anxious and is not hyperactive.         Past Medical History  Diagnosis Date  . Altered mental status   . Pneumonia, organism unspecified   . Memory loss   . Osteoporosis, unspecified     bone density per Morivati in 2012  . Iodine hypothyroidism   . Anxiety state, unspecified   . Pure hypercholesterolemia   . Hematuria 03/16/2007  . Allergic rhinitis, cause unspecified   . Alcohol use 03/16/2007    patient risk factors  . Esophageal reflux   . Tobacco use disorder 03/16/2007    patient risk factors  . 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 Cervicalgia 09/17/2007  . Diplopia 12/21/2007  . Other abnormal glucose 04/08/2008  . Personal history of colonic polyps   . Other diseases of lung, not elsewhere classified 01/23/2009    s/p Fleming/pulmonology consult; intolerant  to Advair  . Gastritis 05/13/2009  . Peripheral vascular disease, unspecified 12/24/2009  . Unspecified hypothyroidism 01/07/2010    Past Surgical History  Procedure Date  . Eye surgery 2010  . 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 Authorizing Provider  atorvastatin (LIPITOR)  20 MG tablet Take 20 mg by mouth daily.   Yes Historical Provider, MD  LORazepam (ATIVAN) 0.5 MG tablet Take 0.5 mg by mouth every 8 (eight) hours.   Yes Historical Provider, MD  Morphine Sulfate, Concentrate, 100 MG/5ML SOLN Take 100 mg by mouth 3 (three) times daily.   Yes  Historical Provider, MD  omeprazole (PRILOSEC) 20 MG capsule Take 20 mg by mouth daily.   Yes Historical Provider, MD  sertraline (ZOLOFT) 100 MG tablet Take 100 mg by mouth daily.   Yes Historical Provider, MD  tapentadol (NUCYNTA) 50 MG TABS Take 75 mg by mouth daily.   Yes Historical Provider, MD  thyroid (ARMOUR) 120 MG tablet Take 120 mg by mouth daily.   Yes Historical Provider, MD  tiotropium (SPIRIVA) 18 MCG inhalation capsule Place 18 mcg into inhaler and inhale daily.   Yes Historical Provider, MD  Vitamin D, Ergocalciferol, (DRISDOL) 50000 UNITS CAPS Take 50,000 Units by mouth daily.   Yes Historical Provider, MD  predniSONE (STERAPRED UNI-PAK) 5 MG TABS Take 5 mg by mouth every other day.    Historical Provider, MD    Allergies  Allergen Reactions  . Erythromycin   . Advair Diskus (Fluticasone-Salmeterol)     Short of breath  . Darvocet (Propoxyphene-Acetaminophen)   . Doxycycline Hyclate Swelling    Tongue swelling, upset stomach  . Septra (Sulfamethoxazole W-Trimethoprim)     History   Social History  . Marital Status: Married    Spouse Name: N/A    Number of Children: 2  . Years of Education: N/A   Occupational History  .      works full time 40 hrs   Social History Main Topics  . Smoking status: Current Everyday Smoker -- 1.0 packs/day for 30 years  . Smokeless tobacco: Not on file  . Alcohol Use: No  . Drug Use: No  . Sexually Active: Not on file   Other Topics Concern  . Not on file   Social History Narrative   Married x 46 years. Children ages 70,20. 1 grandchild and one on the way. Caffeine use: Carbonated beverages, Pepsi: Coffee, many servings a day Pepsi 3-4  Coffee 2 cups. Smoke detectors in home. Always wears seatbelts. Exercise: Inactive. No guns in the home.    Family History  Problem Relation Age of Onset  . Heart disease Father     cad, chf  . Cancer Father   . Heart disease Brother     open heart surgery  . Diabetes Brother   .  Cancer Brother   . Heart disease Mother     cad  . Diabetes Mother   . Stroke Mother     Objective:   Physical Exam  Nursing note and vitals reviewed. Constitutional: He is oriented to person, place, and time. He appears well-developed and well-nourished. No distress.  HENT:  Head: Normocephalic and atraumatic.  Mouth/Throat: Oropharynx is clear and moist.  Eyes: Conjunctivae are normal. No scleral icterus.       PUPILS UNEQUAL.  Neck: Normal range of motion. Neck supple. No thyromegaly present.  Cardiovascular: Normal rate, regular rhythm, normal heart sounds and intact distal pulses.   Pulmonary/Chest: Effort normal and breath sounds normal.  Musculoskeletal: Normal range of motion.  Lymphadenopathy:    He has no cervical adenopathy.  Neurological: He is alert and oriented to person, place, and time. He has normal reflexes. He exhibits normal muscle tone. Coordination normal.  Skin: Skin is warm and dry.  He is not diaphoretic.  Psychiatric: He has a normal mood and affect. His behavior is normal. Judgment and thought content normal.       Assessment & Plan:   1. Altered mental status   2. Hyperlipidemia   3. Depression   4. Muscle weakness     1.  Altered Mental Status:  Persistent with delayed reaction times and poor recall of words since event 12/2011.  S/p Neuropsychiatric evaluation last week and awaiting results in upcoming week.  Sleep study recommended by neuropsychiatric physician; will await his recommendation regarding sleep study facility location for patient's current symptoms.  Pt unable to work at this time due to delayed reaction time; pt operates laser at work.  No improvement in symptoms in past 3-4 months.  Pt currently on short-term disability at work.   2.  Hyperlipidemia: controlled on medication; has been holding medication for past six weeks without improvement in leg weakness; to restart medication. 3.  Depression: stable despite inability to work.   4.   Muscle weakness:  Persistent B legs.  Onset after event with hospitalization in 12/2011.  S/p PT without improvement.  No improvement with holding Lipitor.  May warrant evaluation by ortho; may warrant NCS.  Encourage increased activity with regular exercise. Clarify date of last ABIs.

## 2012-04-20 NOTE — Progress Notes (Signed)
Reviewed and agree.

## 2012-04-22 HISTORY — PX: OTHER SURGICAL HISTORY: SHX169

## 2012-05-09 ENCOUNTER — Telehealth: Payer: Self-pay

## 2012-05-09 NOTE — Telephone Encounter (Signed)
PATIENT'S WIFE CALLED WITH BRAIN TEST RESULTS FOR DR Katrinka Blazing.  THEY HAVE BEEN WAITING ON THESE.  PLEASE CALL ASAP [phone] WORK OR [phone] CELL

## 2012-05-11 NOTE — Telephone Encounter (Signed)
Pt's wife CB again and spoke w/Maudia. Asked for Dr Katrinka Blazing to please review test and call back ASAP.

## 2012-05-12 NOTE — Telephone Encounter (Signed)
LMOM returning call.  KMS

## 2012-05-14 NOTE — Telephone Encounter (Signed)
Spoke with wife---neuropsychiatrist contacted patient with results of neuropsychiatric evaluation.  Neuropsychologist recommends against working due to lack of mental capability, poor memory.  Recommending long term disability; neuropsychiatrist to initiate long term disability paperwork.  Has encouraged patient to initiate federal long term disability process.  Pt to fax over paperwork to be completed regarding physical handicaps.  No further action warranted at this time; will await fax.  KMS

## 2012-05-14 NOTE — Telephone Encounter (Signed)
pts wife CB to ask that Dr Katrinka Blazing try calling her back today on her cell # [phone] to discuss test results

## 2012-05-23 ENCOUNTER — Encounter: Payer: Self-pay | Admitting: Family Medicine

## 2012-05-28 ENCOUNTER — Ambulatory Visit (INDEPENDENT_AMBULATORY_CARE_PROVIDER_SITE_OTHER): Payer: 59 | Admitting: Family Medicine

## 2012-05-28 ENCOUNTER — Encounter: Payer: Self-pay | Admitting: Family Medicine

## 2012-05-28 VITALS — BP 108/72 | HR 73 | Temp 98.9°F | Resp 16 | Ht 70.0 in | Wt 157.0 lb

## 2012-05-28 DIAGNOSIS — F329 Major depressive disorder, single episode, unspecified: Secondary | ICD-10-CM

## 2012-05-28 DIAGNOSIS — E890 Postprocedural hypothyroidism: Secondary | ICD-10-CM

## 2012-05-28 DIAGNOSIS — Z23 Encounter for immunization: Secondary | ICD-10-CM

## 2012-05-28 DIAGNOSIS — J449 Chronic obstructive pulmonary disease, unspecified: Secondary | ICD-10-CM

## 2012-05-28 DIAGNOSIS — IMO0002 Reserved for concepts with insufficient information to code with codable children: Secondary | ICD-10-CM

## 2012-05-28 DIAGNOSIS — M810 Age-related osteoporosis without current pathological fracture: Secondary | ICD-10-CM

## 2012-05-28 DIAGNOSIS — Z720 Tobacco use: Secondary | ICD-10-CM

## 2012-05-28 DIAGNOSIS — E785 Hyperlipidemia, unspecified: Secondary | ICD-10-CM

## 2012-05-28 DIAGNOSIS — I739 Peripheral vascular disease, unspecified: Secondary | ICD-10-CM

## 2012-05-28 DIAGNOSIS — F32A Depression, unspecified: Secondary | ICD-10-CM

## 2012-05-28 DIAGNOSIS — R4182 Altered mental status, unspecified: Secondary | ICD-10-CM

## 2012-05-28 NOTE — Patient Instructions (Addendum)
1. Altered mental status   2. Hyperlipidemia   3. Need for prophylactic vaccination and inoculation against influenza   4. Depression

## 2012-05-28 NOTE — Progress Notes (Signed)
[address]   [place], [place]  [address]   [phone]  Subjective:    Patient ID: Kirk Robinson, male    DOB: 1956/06/10, 56 y.o.   MRN: [id number]  HPIThis 57 y.o. male presents for evaluation of the following:  1.  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  Short-term disability ended this month; continues to receive weekly disability check from work.  Worked with same company for past 35 years.  Staying busy; mowing grass some.  Doing a little bit at a time.  Has a big yard; previously could cut yard 4-5 hours; now taking 3 days.  Keeping granddaughter throughout the week; in preschool three days per week.   Unable to do some physical activities with granddaughter.  No follow-up with neuropsychiatrist; trying to find someone to complete sleep study.    2.  Depression/anxiety: slight worsening with recent neuropsychiatric conclusion.  Having bad days twice per week. Wife has not noticed anything concerning regarding mood.  3. Hyperlipidemia: tolerating Lipitor 1/2 tablet daily; no side effects to medication.  Doing well. Denies chest pain, palpitations, shortness of breath.  4. Hypothyroidism:  Recent follow-up with Moriyati; no changes to medication at that time.     Review of Systems  Constitutional: Negative for fever, chills, diaphoresis and fatigue.  Respiratory: Negative for cough, choking, shortness of breath and wheezing.   Cardiovascular: Negative for chest pain and leg swelling.  Neurological: Positive for weakness. Negative for dizziness, tremors, seizures, syncope, facial asymmetry, speech difficulty, light-headedness, numbness and headaches.  Psychiatric/Behavioral: Positive for dysphoric mood and decreased concentration. Negative for behavioral problems, confusion, disturbed wake/sleep cycle, self-injury and agitation. The patient is not nervous/anxious.         Past  Medical History  Diagnosis Date  . Altered mental status   . Pneumonia, organism unspecified   . Memory loss   . Osteoporosis, unspecified     bone density per Morivati in 2012  . Iodine hypothyroidism   . Anxiety state, unspecified   . Pure hypercholesterolemia   . Hematuria 03/16/2007  . Allergic rhinitis, cause unspecified   . Alcohol use 03/16/2007    patient risk factors  . Esophageal reflux   . Tobacco use disorder 03/16/2007    patient risk factors  . 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 Cervicalgia 09/17/2007  . Diplopia 12/21/2007  . Other abnormal glucose 04/08/2008  . Personal history of colonic polyps   . Other diseases of lung, not elsewhere classified 01/23/2009    s/p Fleming/pulmonology consult; intolerant  to Advair  . Gastritis 05/13/2009  . Peripheral vascular disease, unspecified 12/24/2009  . Unspecified hypothyroidism 01/07/2010    Past Surgical History  Procedure Date  . Eye surgery 2010  . 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 Authorizing Provider  atorvastatin (LIPITOR) 20 MG tablet Take 20 mg by mouth daily.   Yes Historical Provider, MD  LORazepam (ATIVAN) 0.5 MG tablet Take 0.5 mg by mouth every 8 (eight) hours.   Yes Historical Provider, MD  Morphine Sulfate, Concentrate, 100 MG/5ML SOLN Take 100 mg by mouth 3 (three) times daily.   Yes Historical Provider, MD  omeprazole (PRILOSEC) 20 MG capsule Take 20 mg by mouth daily.   Yes Historical Provider, MD  predniSONE (STERAPRED UNI-PAK) 5 MG TABS  Take 5 mg by mouth every other day.   Yes Historical Provider, MD  sertraline (ZOLOFT) 100 MG tablet Take 100 mg by mouth daily.   Yes Historical Provider, MD  tapentadol (NUCYNTA) 50 MG TABS Take 75 mg by mouth daily.   Yes Historical Provider, MD  thyroid (ARMOUR) 120 MG tablet Take 120 mg by mouth daily.   Yes Historical Provider, MD    tiotropium (SPIRIVA) 18 MCG inhalation capsule Place 18 mcg into inhaler and inhale daily.   Yes Historical Provider, MD  Vitamin D, Ergocalciferol, (DRISDOL) 50000 UNITS CAPS Take 50,000 Units by mouth daily.   Yes Historical Provider, MD    Allergies  Allergen Reactions  . Erythromycin   . Advair Diskus (Fluticasone-Salmeterol)     Short of breath  . Darvocet (Propoxyphene-Acetaminophen)   . Doxycycline Hyclate Swelling    Tongue swelling, upset stomach  . Septra (Sulfamethoxazole W-Trimethoprim)     History   Social History  . Marital Status: Married    Spouse Name: N/A    Number of Children: 2  . Years of Education: N/A   Occupational History  .      works full time 40 hrs   Social History Main Topics  . Smoking status: Current Every Day Smoker -- 1.0 packs/day for 30 years  . Smokeless tobacco: Not on file  . Alcohol Use: No  . Drug Use: No  . Sexually Active: Not on file   Other Topics Concern  . Not on file   Social History Narrative   Married x 46 years. Children ages 72,20. 1 grandchild and one on the way. Caffeine use: Carbonated beverages, Pepsi: Coffee, many servings a day Pepsi 3-4  Coffee 2 cups. Smoke detectors in home. Always wears seatbelts. Exercise: Inactive. No guns in the home.    Family History  Problem Relation Age of Onset  . Heart disease Father     cad, chf  . Cancer Father   . Heart disease Brother     open heart surgery  . Diabetes Brother   . Cancer Brother   . Heart disease Mother     cad  . Diabetes Mother   . Stroke Mother     Objective:   Physical Exam  Constitutional: He is oriented to person, place, and time. He appears well-developed and well-nourished. No distress.  HENT:  Head: Normocephalic and atraumatic.  Eyes: Conjunctivae normal are normal.  Neck: Normal range of motion. Neck supple. No thyromegaly present.  Cardiovascular: Normal rate, regular rhythm, normal heart sounds and intact distal pulses.   No murmur  heard. Pulmonary/Chest: Effort normal and breath sounds normal. No respiratory distress. He has no wheezes. He has no rales.  Abdominal: Soft. Bowel sounds are normal. He exhibits no distension and no mass. There is no tenderness. There is no rebound and no guarding.  Lymphadenopathy:    He has no cervical adenopathy.  Neurological: He is alert and oriented to person, place, and time. He exhibits normal muscle tone. Coordination normal.  Skin: Skin is warm and dry. He is not diaphoretic.  Psychiatric: He has a normal mood and affect. His behavior is normal. Judgment and thought content normal.      Assessment & Plan:   1. Altered mental status    2. Hyperlipidemia    3. Need for prophylactic vaccination and inoculation against influenza  Flu vaccine greater than or equal to 3yo preservative free IM  4. Depression  1.  Altered mental status:  Persistent.  S/p neuropsychiatric evaluation with conclusion that patient permanently disabled due to poor short-term memory, poor recall, poor reaction time.  Pt has filed for long-term disability through employment and has also initiated process for federal disability.  To schedule sleep study in upcoming 1-3 months per neuropsychiatry. 2.  Hyperlipidemia: stable; no change in medications at this time; obtain labs at next visit. 3.  Depression: stable despite recent determination that patient disabled; coping well.  Good family support. 4.  S/p flu vaccine in office.

## 2012-05-29 NOTE — Progress Notes (Signed)
Reviewed and agree.

## 2012-05-30 DIAGNOSIS — Z720 Tobacco use: Secondary | ICD-10-CM | POA: Insufficient documentation

## 2012-05-30 DIAGNOSIS — J449 Chronic obstructive pulmonary disease, unspecified: Secondary | ICD-10-CM | POA: Insufficient documentation

## 2012-05-30 DIAGNOSIS — M810 Age-related osteoporosis without current pathological fracture: Secondary | ICD-10-CM | POA: Insufficient documentation

## 2012-05-30 DIAGNOSIS — J441 Chronic obstructive pulmonary disease with (acute) exacerbation: Secondary | ICD-10-CM | POA: Insufficient documentation

## 2012-05-30 DIAGNOSIS — IMO0002 Reserved for concepts with insufficient information to code with codable children: Secondary | ICD-10-CM | POA: Insufficient documentation

## 2012-05-30 DIAGNOSIS — F329 Major depressive disorder, single episode, unspecified: Secondary | ICD-10-CM | POA: Insufficient documentation

## 2012-05-30 DIAGNOSIS — I739 Peripheral vascular disease, unspecified: Secondary | ICD-10-CM | POA: Insufficient documentation

## 2012-05-30 DIAGNOSIS — E890 Postprocedural hypothyroidism: Secondary | ICD-10-CM | POA: Insufficient documentation

## 2012-05-30 DIAGNOSIS — E785 Hyperlipidemia, unspecified: Secondary | ICD-10-CM | POA: Insufficient documentation

## 2012-05-30 DIAGNOSIS — F32A Depression, unspecified: Secondary | ICD-10-CM | POA: Insufficient documentation

## 2012-06-04 ENCOUNTER — Encounter: Payer: Self-pay | Admitting: Family Medicine

## 2012-06-21 ENCOUNTER — Telehealth: Payer: Self-pay

## 2012-06-21 NOTE — Telephone Encounter (Signed)
Patient saw Dr Katrinka Blazing 05/28/12 and thought she was going to send a [facility], but they don't have that rx. He is almost out so he would like [facility] [place] (tel # [phone]) as well as the normal [facility] (fax # [phone])

## 2012-06-22 MED ORDER — SERTRALINE HCL 100 MG PO TABS: 100.0000 mg | ORAL_TABLET | Freq: Every day | ORAL | Status: DC

## 2012-06-22 NOTE — Telephone Encounter (Signed)
[facility], prescription signed at desk to be faxed to [facility]

## 2012-06-22 NOTE — Telephone Encounter (Signed)
[facility]ed to [facility]

## 2012-07-02 ENCOUNTER — Ambulatory Visit: Payer: 59 | Admitting: Family Medicine

## 2012-07-25 ENCOUNTER — Inpatient Hospital Stay: Payer: Self-pay | Admitting: Internal Medicine

## 2012-07-25 LAB — COMPREHENSIVE METABOLIC PANEL
Albumin: 3 g/dL — ABNORMAL LOW (ref 3.4–5.0)
Alkaline Phosphatase: 120 U/L (ref 50–136)
Anion Gap: 8 (ref 7–16)
BUN: 10 mg/dL (ref 7–18)
Bilirubin,Total: 0.9 mg/dL (ref 0.2–1.0)
Calcium, Total: 8.7 mg/dL (ref 8.5–10.1)
Chloride: 103 mmol/L (ref 98–107)
Co2: 26 mmol/L (ref 21–32)
Creatinine: 0.81 mg/dL (ref 0.60–1.30)
EGFR (African American): >60
EGFR (Non-African Amer.): >60
Glucose: 111 mg/dL — ABNORMAL HIGH (ref 65–99)
Osmolality: 274 (ref 275–301)
Potassium: 3.4 mmol/L — ABNORMAL LOW (ref 3.5–5.1)
SGOT(AST): 36 U/L (ref 15–37)
SGPT (ALT): 13 U/L (ref 12–78)
Sodium: 137 mmol/L (ref 136–145)
Total Protein: 7.4 g/dL (ref 6.4–8.2)

## 2012-07-25 LAB — CBC WITH DIFFERENTIAL/PLATELET
Basophil #: 0.1 10*3/uL (ref 0.0–0.1)
Basophil %: 0.8 %
Eosinophil #: 0.1 10*3/uL (ref 0.0–0.7)
Eosinophil %: 0.9 %
HCT: 33.1 % — ABNORMAL LOW (ref 40.0–52.0)
HGB: 11.6 g/dL — ABNORMAL LOW (ref 13.0–18.0)
Lymphocyte #: 1.5 10*3/uL (ref 1.0–3.6)
Lymphocyte %: 12 %
MCH: 30.7 pg (ref 26.0–34.0)
MCHC: 34.9 g/dL (ref 32.0–36.0)
MCV: 88 fL (ref 80–100)
Monocyte #: 0.5 x10 3/mm (ref 0.2–1.0)
Monocyte %: 4.2 %
Neutrophil #: 10.2 10*3/uL — ABNORMAL HIGH (ref 1.4–6.5)
Neutrophil %: 82.1 %
Platelet: 261 10*3/uL (ref 150–440)
RBC: 3.76 10*6/uL — ABNORMAL LOW (ref 4.40–5.90)
RDW: 13.1 % (ref 11.5–14.5)
WBC: 12.4 10*3/uL — ABNORMAL HIGH (ref 3.8–10.6)

## 2012-07-25 LAB — TSH: Thyroid Stimulating Horm: 0.462 u[IU]/mL

## 2012-07-25 LAB — TROPONIN I: Troponin-I: <0.02 ng/mL

## 2012-07-26 LAB — CBC WITH DIFFERENTIAL/PLATELET
Basophil #: 0 10*3/uL (ref 0.0–0.1)
Basophil %: 0 %
Eosinophil #: 0 10*3/uL (ref 0.0–0.7)
Eosinophil %: 0 %
HCT: 35.5 % — ABNORMAL LOW (ref 40.0–52.0)
HGB: 12.5 g/dL — ABNORMAL LOW (ref 13.0–18.0)
Lymphocyte #: 0.3 10*3/uL — ABNORMAL LOW (ref 1.0–3.6)
Lymphocyte %: 3 %
MCH: 30.9 pg (ref 26.0–34.0)
MCHC: 35.2 g/dL (ref 32.0–36.0)
MCV: 88 fL (ref 80–100)
Monocyte #: 0.2 x10 3/mm (ref 0.2–1.0)
Monocyte %: 1.6 %
Neutrophil #: 11 10*3/uL — ABNORMAL HIGH (ref 1.4–6.5)
Neutrophil %: 95.4 %
Platelet: 275 10*3/uL (ref 150–440)
RBC: 4.05 10*6/uL — ABNORMAL LOW (ref 4.40–5.90)
RDW: 13.1 % (ref 11.5–14.5)
WBC: 11.6 10*3/uL — ABNORMAL HIGH (ref 3.8–10.6)

## 2012-07-26 LAB — LACTATE DEHYDROGENASE: LDH: 583 U/L — ABNORMAL HIGH (ref 85–241)

## 2012-07-27 LAB — CBC WITH DIFFERENTIAL/PLATELET
Basophil #: 0.1 10*3/uL (ref 0.0–0.1)
Basophil %: 0.4 %
Eosinophil #: 0.1 10*3/uL (ref 0.0–0.7)
Eosinophil %: 0.3 %
HCT: 36.8 % — ABNORMAL LOW (ref 40.0–52.0)
HGB: 12.7 g/dL — ABNORMAL LOW (ref 13.0–18.0)
Lymphocyte #: 0.7 10*3/uL — ABNORMAL LOW (ref 1.0–3.6)
Lymphocyte %: 3.1 %
MCH: 30.3 pg (ref 26.0–34.0)
MCHC: 34.6 g/dL (ref 32.0–36.0)
MCV: 88 fL (ref 80–100)
Monocyte #: 0 x10 3/mm — ABNORMAL LOW (ref 0.2–1.0)
Monocyte %: 0.2 %
Neutrophil #: 21.5 10*3/uL — ABNORMAL HIGH (ref 1.4–6.5)
Neutrophil %: 96 %
Platelet: 365 10*3/uL (ref 150–440)
RBC: 4.19 10*6/uL — ABNORMAL LOW (ref 4.40–5.90)
RDW: 13 % (ref 11.5–14.5)
WBC: 22.3 10*3/uL — ABNORMAL HIGH (ref 3.8–10.6)

## 2012-07-27 LAB — BASIC METABOLIC PANEL
Anion Gap: 6 — ABNORMAL LOW (ref 7–16)
BUN: 21 mg/dL — ABNORMAL HIGH (ref 7–18)
Calcium, Total: 9.2 mg/dL (ref 8.5–10.1)
Chloride: 106 mmol/L (ref 98–107)
Co2: 27 mmol/L (ref 21–32)
Creatinine: 0.87 mg/dL (ref 0.60–1.30)
EGFR (African American): >60
EGFR (Non-African Amer.): >60
Glucose: 167 mg/dL — ABNORMAL HIGH (ref 65–99)
Osmolality: 284 (ref 275–301)
Potassium: 4.2 mmol/L (ref 3.5–5.1)
Sodium: 139 mmol/L (ref 136–145)

## 2012-07-28 LAB — BASIC METABOLIC PANEL
Anion Gap: 6 — ABNORMAL LOW (ref 7–16)
BUN: 32 mg/dL — ABNORMAL HIGH (ref 7–18)
Calcium, Total: 9 mg/dL (ref 8.5–10.1)
Chloride: 107 mmol/L (ref 98–107)
Co2: 28 mmol/L (ref 21–32)
Creatinine: 0.94 mg/dL (ref 0.60–1.30)
EGFR (African American): >60
EGFR (Non-African Amer.): >60
Glucose: 179 mg/dL — ABNORMAL HIGH (ref 65–99)
Osmolality: 293 (ref 275–301)
Potassium: 4.1 mmol/L (ref 3.5–5.1)
Sodium: 141 mmol/L (ref 136–145)

## 2012-07-28 LAB — CBC WITH DIFFERENTIAL/PLATELET
Basophil #: 0 10*3/uL (ref 0.0–0.1)
Basophil %: 0.1 %
Eosinophil #: 0 10*3/uL (ref 0.0–0.7)
Eosinophil %: 0 %
HCT: 35.8 % — ABNORMAL LOW (ref 40.0–52.0)
HGB: 12.3 g/dL — ABNORMAL LOW (ref 13.0–18.0)
Lymphocyte #: 0.6 10*3/uL — ABNORMAL LOW (ref 1.0–3.6)
Lymphocyte %: 3.8 %
MCH: 30.1 pg (ref 26.0–34.0)
MCHC: 34.3 g/dL (ref 32.0–36.0)
MCV: 88 fL (ref 80–100)
Monocyte #: 0.6 x10 3/mm (ref 0.2–1.0)
Monocyte %: 3.6 %
Neutrophil #: 14.9 10*3/uL — ABNORMAL HIGH (ref 1.4–6.5)
Neutrophil %: 92.5 %
Platelet: 380 10*3/uL (ref 150–440)
RBC: 4.08 10*6/uL — ABNORMAL LOW (ref 4.40–5.90)
RDW: 13.1 % (ref 11.5–14.5)
WBC: 16.1 10*3/uL — ABNORMAL HIGH (ref 3.8–10.6)

## 2012-07-29 LAB — RAPID INFLUENZA A&B ANTIGENS

## 2012-07-30 LAB — CBC WITH DIFFERENTIAL/PLATELET
Basophil #: 0 10*3/uL (ref 0.0–0.1)
Basophil %: 0.2 %
Eosinophil #: 0 10*3/uL (ref 0.0–0.7)
Eosinophil %: 0 %
HCT: 38 % — ABNORMAL LOW (ref 40.0–52.0)
HGB: 13.1 g/dL (ref 13.0–18.0)
Lymphocyte #: 0.7 10*3/uL — ABNORMAL LOW (ref 1.0–3.6)
Lymphocyte %: 6.6 %
MCH: 30.1 pg (ref 26.0–34.0)
MCHC: 34.5 g/dL (ref 32.0–36.0)
MCV: 87 fL (ref 80–100)
Monocyte #: 0.5 x10 3/mm (ref 0.2–1.0)
Monocyte %: 4.7 %
Neutrophil #: 10 10*3/uL — ABNORMAL HIGH (ref 1.4–6.5)
Neutrophil %: 88.5 %
Platelet: 340 10*3/uL (ref 150–440)
RBC: 4.35 10*6/uL — ABNORMAL LOW (ref 4.40–5.90)
RDW: 13.1 % (ref 11.5–14.5)
WBC: 11.3 10*3/uL — ABNORMAL HIGH (ref 3.8–10.6)

## 2012-07-30 LAB — CULTURE, BLOOD (SINGLE)

## 2012-08-01 LAB — CREATININE, SERUM
Creatinine: 0.82 mg/dL (ref 0.60–1.30)
EGFR (African American): >60
EGFR (Non-African Amer.): >60

## 2012-08-01 LAB — HEMOGLOBIN A1C: Hemoglobin A1C: 6.2 % (ref 4.2–6.3)

## 2012-08-06 ENCOUNTER — Telehealth: Payer: Self-pay

## 2012-08-06 NOTE — Telephone Encounter (Signed)
Dr. Katrinka Blazing : patients wife called to advise you that he was admitted on the 4th of December and released on the 14th from [facility] with double pneumonia.  Please call her at [phone]. He is on oxygen at home and the home health nurses are coming three times a week.

## 2012-08-07 NOTE — Telephone Encounter (Signed)
After Thanksgiving, thought pt suffering with a virus.  On 07/24/12, called wife due to labored breathing at 4:30pm.  [facility] office but no one answered.  Wife called pt back, pt thought could be evaluated the following day.  Appointment scheduled at 2:30 12/4; pulse oximetry 76% at visit.  Admitted 12/4-12/13.  Slow recovery; discharged on home oxygen; pulse oximetry 90%.  Follow up this week for repeat CXR.  S/p CT during admission; s/p EKG.  No ICU admission.  Discharged on no antibiotics due to prolonged hospitalization; discharged on Prednisone taper.  Has not smoked since hospital discharge.   Has appointment in 08/2012; to keep appointment.  Call if needs sooner appointment.  KMS

## 2012-08-09 ENCOUNTER — Telehealth: Payer: Self-pay

## 2012-08-09 ENCOUNTER — Inpatient Hospital Stay: Payer: Self-pay | Admitting: Internal Medicine

## 2012-08-09 LAB — COMPREHENSIVE METABOLIC PANEL
Albumin: 2.3 g/dL — ABNORMAL LOW (ref 3.4–5.0)
Alkaline Phosphatase: 108 U/L (ref 50–136)
Anion Gap: 5 — ABNORMAL LOW (ref 7–16)
BUN: 7 mg/dL (ref 7–18)
Bilirubin,Total: 0.3 mg/dL (ref 0.2–1.0)
Calcium, Total: 8.1 mg/dL — ABNORMAL LOW (ref 8.5–10.1)
Chloride: 102 mmol/L (ref 98–107)
Co2: 30 mmol/L (ref 21–32)
Creatinine: 0.69 mg/dL (ref 0.60–1.30)
EGFR (African American): >60
EGFR (Non-African Amer.): >60
Glucose: 242 mg/dL — ABNORMAL HIGH (ref 65–99)
Osmolality: 280 (ref 275–301)
Potassium: 3.7 mmol/L (ref 3.5–5.1)
SGOT(AST): 17 U/L (ref 15–37)
SGPT (ALT): 20 U/L (ref 12–78)
Sodium: 137 mmol/L (ref 136–145)
Total Protein: 5.9 g/dL — ABNORMAL LOW (ref 6.4–8.2)

## 2012-08-09 LAB — CBC WITH DIFFERENTIAL/PLATELET
Basophil #: 0.1 10*3/uL (ref 0.0–0.1)
Basophil %: 1.1 %
Eosinophil #: 0 10*3/uL (ref 0.0–0.7)
Eosinophil %: 0.2 %
HCT: 34.6 % — ABNORMAL LOW (ref 40.0–52.0)
HGB: 11.9 g/dL — ABNORMAL LOW (ref 13.0–18.0)
Lymphocyte #: 0.5 10*3/uL — ABNORMAL LOW (ref 1.0–3.6)
Lymphocyte %: 4.9 %
MCH: 29.9 pg (ref 26.0–34.0)
MCHC: 34.3 g/dL (ref 32.0–36.0)
MCV: 87 fL (ref 80–100)
Monocyte #: 0.4 x10 3/mm (ref 0.2–1.0)
Monocyte %: 3.4 %
Neutrophil #: 10.1 10*3/uL — ABNORMAL HIGH (ref 1.4–6.5)
Neutrophil %: 90.4 %
Platelet: 269 10*3/uL (ref 150–440)
RBC: 3.97 10*6/uL — ABNORMAL LOW (ref 4.40–5.90)
RDW: 13.4 % (ref 11.5–14.5)
WBC: 11.2 10*3/uL — ABNORMAL HIGH (ref 3.8–10.6)

## 2012-08-09 LAB — PROTIME-INR
INR: 0.9
Prothrombin Time: 12.5 secs (ref 11.5–14.7)

## 2012-08-09 LAB — TROPONIN I: Troponin-I: <0.02 ng/mL

## 2012-08-09 LAB — APTT: Activated PTT: 24.1 secs (ref 23.6–35.9)

## 2012-08-09 NOTE — Telephone Encounter (Signed)
DR Philip Aspen LMOM AT 104 BILLING OFFICE TO INFORM YOU THAT PATIENT HAS BEEN RE ADMITTED TO [facility]   WIFE PH# [phone]

## 2012-08-10 LAB — BASIC METABOLIC PANEL
Anion Gap: 7 (ref 7–16)
BUN: 8 mg/dL (ref 7–18)
Calcium, Total: 8.6 mg/dL (ref 8.5–10.1)
Chloride: 98 mmol/L (ref 98–107)
Co2: 32 mmol/L (ref 21–32)
Creatinine: 0.77 mg/dL (ref 0.60–1.30)
EGFR (African American): >60
EGFR (Non-African Amer.): >60
Glucose: 215 mg/dL — ABNORMAL HIGH (ref 65–99)
Osmolality: 279 (ref 275–301)
Potassium: 3.9 mmol/L (ref 3.5–5.1)
Sodium: 137 mmol/L (ref 136–145)

## 2012-08-10 LAB — CBC WITH DIFFERENTIAL/PLATELET
Basophil #: 0 10*3/uL (ref 0.0–0.1)
Basophil %: 0.1 %
Eosinophil #: 0 10*3/uL (ref 0.0–0.7)
Eosinophil %: 0 %
HCT: 35.3 % — ABNORMAL LOW (ref 40.0–52.0)
HGB: 11.9 g/dL — ABNORMAL LOW (ref 13.0–18.0)
Lymphocyte #: 0.4 10*3/uL — ABNORMAL LOW (ref 1.0–3.6)
Lymphocyte %: 2.6 %
MCH: 29.6 pg (ref 26.0–34.0)
MCHC: 33.7 g/dL (ref 32.0–36.0)
MCV: 88 fL (ref 80–100)
Monocyte #: 0.2 x10 3/mm (ref 0.2–1.0)
Monocyte %: 1 %
Neutrophil #: 16.7 10*3/uL — ABNORMAL HIGH (ref 1.4–6.5)
Neutrophil %: 96.3 %
Platelet: 251 10*3/uL (ref 150–440)
RBC: 4.01 10*6/uL — ABNORMAL LOW (ref 4.40–5.90)
RDW: 13.5 % (ref 11.5–14.5)
WBC: 17.3 10*3/uL — ABNORMAL HIGH (ref 3.8–10.6)

## 2012-08-10 LAB — HEMOGLOBIN A1C: Hemoglobin A1C: 6.6 % — ABNORMAL HIGH (ref 4.2–6.3)

## 2012-08-10 LAB — LIPID PANEL
Cholesterol: 157 mg/dL (ref 0–200)
HDL Cholesterol: 61 mg/dL — ABNORMAL HIGH (ref 40–60)
Ldl Cholesterol, Calc: 80 mg/dL (ref 0–100)
Triglycerides: 78 mg/dL (ref 0–200)
VLDL Cholesterol, Calc: 16 mg/dL (ref 5–40)

## 2012-08-10 LAB — RAPID INFLUENZA A&B ANTIGENS

## 2012-08-11 LAB — COMPREHENSIVE METABOLIC PANEL
Albumin: 2.4 g/dL — ABNORMAL LOW (ref 3.4–5.0)
Alkaline Phosphatase: 100 U/L (ref 50–136)
Anion Gap: 7 (ref 7–16)
BUN: 16 mg/dL (ref 7–18)
Bilirubin,Total: 0.4 mg/dL (ref 0.2–1.0)
Calcium, Total: 7.9 mg/dL — ABNORMAL LOW (ref 8.5–10.1)
Chloride: 99 mmol/L (ref 98–107)
Co2: 32 mmol/L (ref 21–32)
Creatinine: 0.89 mg/dL (ref 0.60–1.30)
EGFR (African American): >60
EGFR (Non-African Amer.): >60
Glucose: 162 mg/dL — ABNORMAL HIGH (ref 65–99)
Osmolality: 280 (ref 275–301)
Potassium: 4.6 mmol/L (ref 3.5–5.1)
SGOT(AST): 8 U/L — ABNORMAL LOW (ref 15–37)
SGPT (ALT): 18 U/L (ref 12–78)
Sodium: 138 mmol/L (ref 136–145)
Total Protein: 6.3 g/dL — ABNORMAL LOW (ref 6.4–8.2)

## 2012-08-11 LAB — CBC WITH DIFFERENTIAL/PLATELET
Basophil #: 0.1 10*3/uL (ref 0.0–0.1)
Basophil %: 0.2 %
Eosinophil #: 0 10*3/uL (ref 0.0–0.7)
Eosinophil %: 0 %
HCT: 38.5 % — ABNORMAL LOW (ref 40.0–52.0)
HGB: 12.7 g/dL — ABNORMAL LOW (ref 13.0–18.0)
Lymphocyte #: 0.4 10*3/uL — ABNORMAL LOW (ref 1.0–3.6)
Lymphocyte %: 1.6 %
MCH: 29.1 pg (ref 26.0–34.0)
MCHC: 32.9 g/dL (ref 32.0–36.0)
MCV: 88 fL (ref 80–100)
Monocyte #: 0.3 x10 3/mm (ref 0.2–1.0)
Monocyte %: 1.2 %
Neutrophil #: 23.6 10*3/uL — ABNORMAL HIGH (ref 1.4–6.5)
Neutrophil %: 97 %
Platelet: 300 10*3/uL (ref 150–440)
RBC: 4.36 10*6/uL — ABNORMAL LOW (ref 4.40–5.90)
RDW: 14 % (ref 11.5–14.5)
WBC: 24.4 10*3/uL — ABNORMAL HIGH (ref 3.8–10.6)

## 2012-08-14 LAB — WOUND CULTURE

## 2012-08-20 ENCOUNTER — Other Ambulatory Visit: Payer: Self-pay | Admitting: Family Medicine

## 2012-08-22 ENCOUNTER — Other Ambulatory Visit: Payer: Self-pay | Admitting: Radiology

## 2012-08-27 LAB — PATHOLOGY REPORT

## 2012-08-27 NOTE — Telephone Encounter (Signed)
Left message on voicemail to check pt's status. KMS

## 2012-09-03 ENCOUNTER — Encounter: Payer: 59 | Admitting: Family Medicine

## 2012-10-20 DIAGNOSIS — Z0271 Encounter for disability determination: Secondary | ICD-10-CM

## 2012-11-17 ENCOUNTER — Other Ambulatory Visit: Payer: Self-pay | Admitting: Physician Assistant

## 2013-01-14 ENCOUNTER — Ambulatory Visit: Payer: 59 | Admitting: Family Medicine

## 2013-01-14 ENCOUNTER — Ambulatory Visit: Payer: 59

## 2013-01-14 VITALS — BP 110/68 | HR 77 | Temp 97.7°F | Resp 18 | Ht 69.0 in | Wt 170.0 lb

## 2013-01-14 DIAGNOSIS — K219 Gastro-esophageal reflux disease without esophagitis: Secondary | ICD-10-CM

## 2013-01-14 DIAGNOSIS — J441 Chronic obstructive pulmonary disease with (acute) exacerbation: Secondary | ICD-10-CM

## 2013-01-14 DIAGNOSIS — E7800 Pure hypercholesterolemia, unspecified: Secondary | ICD-10-CM

## 2013-01-14 DIAGNOSIS — R05 Cough: Secondary | ICD-10-CM

## 2013-01-14 DIAGNOSIS — R059 Cough, unspecified: Secondary | ICD-10-CM

## 2013-01-14 DIAGNOSIS — F329 Major depressive disorder, single episode, unspecified: Secondary | ICD-10-CM

## 2013-01-14 DIAGNOSIS — F32A Depression, unspecified: Secondary | ICD-10-CM

## 2013-01-14 DIAGNOSIS — J8410 Pulmonary fibrosis, unspecified: Secondary | ICD-10-CM | POA: Insufficient documentation

## 2013-01-14 DIAGNOSIS — R7309 Other abnormal glucose: Secondary | ICD-10-CM

## 2013-01-14 DIAGNOSIS — J209 Acute bronchitis, unspecified: Secondary | ICD-10-CM

## 2013-01-14 LAB — POCT CBC
Granulocyte percent: 80.5 %G — AB (ref 37–80)
HCT, POC: 43.7 % (ref 43.5–53.7)
Hemoglobin: 14.2 g/dL (ref 14.1–18.1)
Lymph, poc: 1.5 (ref 0.6–3.4)
MCH, POC: 30.9 pg (ref 27–31.2)
MCHC: 32.5 g/dL (ref 31.8–35.4)
MCV: 95.1 fL (ref 80–97)
MID (cbc): 0.6 (ref 0–0.9)
MPV: 8.2 fL (ref 0–99.8)
POC Granulocyte: 8.9 — AB (ref 2–6.9)
POC LYMPH PERCENT: 13.6 %L (ref 10–50)
POC MID %: 5.9 %M (ref 0–12)
Platelet Count, POC: 263 10*3/uL (ref 142–424)
RBC: 4.6 M/uL — AB (ref 4.69–6.13)
RDW, POC: 15.1 %
WBC: 11 10*3/uL — AB (ref 4.6–10.2)

## 2013-01-14 LAB — COMPREHENSIVE METABOLIC PANEL
ALT: 10 U/L (ref 0–53)
AST: 16 U/L (ref 0–37)
Albumin: 4.4 g/dL (ref 3.5–5.2)
Alkaline Phosphatase: 78 U/L (ref 39–117)
BUN: 8 mg/dL (ref 6–23)
CO2: 32 mEq/L (ref 19–32)
Calcium: 9.7 mg/dL (ref 8.4–10.5)
Chloride: 100 mEq/L (ref 96–112)
Creat: 0.82 mg/dL (ref 0.50–1.35)
Glucose, Bld: 96 mg/dL (ref 70–99)
Potassium: 4.5 mEq/L (ref 3.5–5.3)
Sodium: 141 mEq/L (ref 135–145)
Total Bilirubin: 0.6 mg/dL (ref 0.3–1.2)
Total Protein: 7.1 g/dL (ref 6.0–8.3)

## 2013-01-14 LAB — GLUCOSE, POCT (MANUAL RESULT ENTRY): POC Glucose: 81 mg/dl (ref 70–99)

## 2013-01-14 LAB — POCT GLYCOSYLATED HEMOGLOBIN (HGB A1C): Hemoglobin A1C: 6.3

## 2013-01-14 MED ORDER — OMEPRAZOLE 20 MG PO CPDR: 20.0000 mg | DELAYED_RELEASE_CAPSULE | Freq: Every day | ORAL | Status: DC

## 2013-01-14 MED ORDER — SERTRALINE HCL 100 MG PO TABS: 100.0000 mg | ORAL_TABLET | Freq: Every day | ORAL | Status: DC

## 2013-01-14 MED ORDER — LORAZEPAM 0.5 MG PO TABS: 0.5000 mg | ORAL_TABLET | Freq: Every evening | ORAL | Status: DC | PRN

## 2013-01-14 MED ORDER — ATORVASTATIN CALCIUM 20 MG PO TABS: 20.0000 mg | ORAL_TABLET | Freq: Every day | ORAL | Status: DC

## 2013-01-14 MED ORDER — LEVOFLOXACIN 750 MG PO TABS: 750.0000 mg | ORAL_TABLET | Freq: Every day | ORAL | Status: DC

## 2013-01-14 NOTE — Progress Notes (Signed)
[address]   [place], [place]  [address]   [phone]  Subjective:    Patient ID: Kirk Robinson, male    DOB: 1955/12/13, 57 y.o.   MRN: [id number]  HPI This 57 y.o. male presents for seven month follow-up:  1.  Mental Impairment:  Received long term disability in 07/2012.  Has not scheduled sleep study due to decline in health since last visit in 05/2012.   2.  Pulmonary Fibrosis:  Diagnosed in 08/2012; s/p lung biopsy by general surgeon in [place].  Oxygen PRN now.  Disability also for pulmonary fibrosis.  Baseline pulse oximetry of 92-95%.  4. COPD:  S/p follow-up with Meredeth Ide three days ago.  S/p CXR which looked much improved.  S/p admission 07/2012; thirteen day admission followed by ten day admission three days after discharge; did not warrant intubation; almost required ICU admission.  Taking Spiriva; has nebulizer at home; started wheezing yesterday; used Albuterol twice yesterday and none today.  Was maintained on Prednisone 30mg  daily for several weeks; has weaned down to 10mg  daily.    5.  Cold: onset yesterday.  No fever; no chills/sweats.  +malaise yesterday.  Took Tylenol yesterday.  +HA.  No ear pain; +burning in throat from coughing.  No pain with swallowing.  No rhinorrhea.  +coughing a lot; throat really irritated.  +sputum production severe yesterday yellow; moderate production today; really thick.  Mild SOB for past two days.  Baseline oxygen level 92% on room air; at rest, will increase to 95% highest.  Pulse oximetry decreased to 89-90% yesterday.  Decreased Prednisone to 10mg  last week from 15mg  per Dr. Meredeth Ide.  No v/d.   6.  Hyperlipidemia: stopped/ran out of Lipitor in 08/2012.  Needs refill.  7.  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  Needs refill of Lorazepam for insomnia; insomnia worsens with increased Prednisone dose.      Review of Systems  Constitutional: Positive for fever, chills, diaphoresis and fatigue.  HENT: Negative for ear pain, congestion, rhinorrhea, sneezing and postnasal drip.   Respiratory: Positive for cough, shortness of breath and wheezing. Negative for choking, chest tightness and stridor.   Cardiovascular: Negative for chest pain, palpitations and leg swelling.  Neurological: Negative for dizziness, tremors, seizures, syncope, facial asymmetry, speech difficulty, weakness, light-headedness, numbness and headaches.  Psychiatric/Behavioral: Positive for sleep disturbance. Negative for dysphoric mood. The patient is not nervous/anxious.     Past Medical History  Diagnosis Date  . Altered mental status   . Pneumonia, organism unspecified   . Memory loss   . Osteoporosis, unspecified     bone density per Morivati in 2012  . Iodine hypothyroidism   . Anxiety state, unspecified   . Pure hypercholesterolemia   . Hematuria 03/16/2007  . Allergic rhinitis, cause unspecified   . Alcohol use 03/16/2007    patient risk factors  . Esophageal reflux   . Tobacco use disorder 03/16/2007    patient risk factors  . 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 Cervicalgia 09/17/2007  . Diplopia 12/21/2007  . Other abnormal glucose 04/08/2008  . Personal history of colonic polyps   . Other diseases of lung, not elsewhere classified 01/23/2009    s/p Fleming/pulmonology consult; intolerant  to Advair  . Gastritis 05/13/2009  . Peripheral vascular disease, unspecified 12/24/2009  . Unspecified hypothyroidism 01/07/2010    Past Surgical History  Procedure Laterality Date  . Eye surgery  2010  . 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 Admission 11/2011      altered mental status/confusion with syncope.  CT head negative, MRI brain negative, EEG: diffuse slowing but no  seizure acitivity.  Labs negative.  [facility].  Marland Kitchen Neuropsychiatric evaluation  04/2012    poor  short-term memory, poor recall, delayed reaction time; permanently disabled.      Prior to Admission medications   Medication Sig Start Date End Date Taking? Authorizing Provider  budesonide (PULMICORT) 180 MCG/ACT inhaler Inhale 1 puff into the lungs 2 (two) times daily.   Yes Historical Provider, MD  Morphine Sulfate, Concentrate, 100 MG/5ML SOLN Take 100 mg by mouth 3 (three) times daily.   Yes Historical Provider, MD  omeprazole (PRILOSEC) 20 MG capsule Take 20 mg by mouth daily.   Yes Historical Provider, MD  predniSONE (STERAPRED UNI-PAK) 5 MG TABS Take 5 mg by mouth every other day.   Yes Historical Provider, MD  sertraline (ZOLOFT) 100 MG tablet Take 1 tablet (100 mg total) by mouth daily. 06/22/12  Yes Eleanore Delia Chimes, PA-C  tapentadol (NUCYNTA) 50 MG TABS Take 75 mg by mouth daily.   Yes Historical Provider, MD  thyroid (ARMOUR) 120 MG tablet Take 120 mg by mouth daily.   Yes Historical Provider, MD  tiotropium (SPIRIVA) 18 MCG inhalation capsule Place 18 mcg into inhaler and inhale daily.   Yes Historical Provider, MD  atorvastatin (LIPITOR) 20 MG tablet Take 20 mg by mouth daily.    Historical Provider, MD  LORazepam (ATIVAN) 0.5 MG tablet Take 0.5 mg by mouth every 8 (eight) hours.    Historical Provider, MD  LORazepam (ATIVAN) 1 MG tablet TAKE 1 TABLET BY MOUTH TWICE DAILY AS NEEDED 08/20/12   Ethelda Chick, MD  sertraline (ZOLOFT) 100 MG tablet TAKE 1 TABLET (100 MG TOTAL) BY MOUTH DAILY. 11/17/12   Ethelda Chick, MD  Vitamin D, Ergocalciferol, (DRISDOL) 50000 UNITS CAPS Take 50,000 Units by mouth daily.    Historical Provider, MD    Allergies  Allergen Reactions  . Erythromycin   . Advair Diskus (Fluticasone-Salmeterol)     Short of breath  . Darvocet (Propoxyphene-Acetaminophen)   . Doxycycline Hyclate Swelling    Tongue swelling, upset stomach  . Septra (Sulfamethoxazole W-Trimethoprim)     History   Social History  . Marital Status: Married    Spouse Name: N/A     Number of Children: 2  . Years of Education: N/A   Occupational History  .      works full time 40 hrs   Social History Main Topics  . Smoking status: Current Every Day Smoker -- 1.00 packs/day for 30 years  . Smokeless tobacco: Not on file  . Alcohol Use: No  . Drug Use: No  . Sexually Active: Not on file   Other Topics Concern  . Not on file   Social History Narrative   Married x 46 years. Children ages 77,20. 1 grandchild and one on the way. Caffeine use: Carbonated beverages, Pepsi: Coffee, many servings a day Pepsi 3-4  Coffee 2 cups. Smoke detectors in home. Always wears seatbelts. Exercise: Inactive. No guns in the home.    Family History  Problem Relation Age of Onset  . Heart disease Father     cad, chf  . Cancer Father   . Heart disease Brother     open heart surgery  .  Diabetes Brother   . Cancer Brother   . Heart disease Mother     cad  . Diabetes Mother   . Stroke Mother        Objective:   Physical Exam  Nursing note and vitals reviewed. Constitutional: He is oriented to person, place, and time. He appears well-developed and well-nourished. No distress.  HENT:  Head: Normocephalic and atraumatic.  Right Ear: External ear normal.  Left Ear: External ear normal.  Nose: Nose normal.  Mouth/Throat: No oropharyngeal exudate.  Soft palate erythema diffusely; no swelling/edema.  Neck: Normal range of motion. Neck supple. No JVD present. No thyromegaly present.  Cardiovascular: Normal rate, regular rhythm and normal heart sounds.  Exam reveals no gallop and no friction rub.   No murmur heard. No LE edema.  Pulmonary/Chest: No accessory muscle usage. Not tachypneic. No respiratory distress. He has no decreased breath sounds. He has wheezes in the right upper field, the right middle field, the right lower field, the left upper field, the left middle field and the left lower field. He has no rhonchi. He has no rales.  Lymphadenopathy:    He has no cervical  adenopathy.  Neurological: He is alert and oriented to person, place, and time.  Skin: No rash noted. He is not diaphoretic.  Psychiatric: He has a normal mood and affect. His behavior is normal.   Results for orders placed in visit on 01/14/13  POCT CBC      Result Value Range   WBC 11.0 (*) 4.6 - 10.2 K/uL   Lymph, poc 1.5  0.6 - 3.4   POC LYMPH PERCENT 13.6  10 - 50 %L   MID (cbc) 0.6  0 - 0.9   POC MID % 5.9  0 - 12 %M   POC Granulocyte 8.9 (*) 2 - 6.9   Granulocyte percent 80.5 (*) 37 - 80 %G   RBC 4.60 (*) 4.69 - 6.13 M/uL   Hemoglobin 14.2  14.1 - 18.1 g/dL   HCT, POC 13.0  86.5 - 53.7 %   MCV 95.1  80 - 97 fL   MCH, POC 30.9  27 - 31.2 pg   MCHC 32.5  31.8 - 35.4 g/dL   RDW, POC 78.4     Platelet Count, POC 263  142 - 424 K/uL   MPV 8.2  0 - 99.8 fL  GLUCOSE, POCT (MANUAL RESULT ENTRY)      Result Value Range   POC Glucose 81  70 - 99 mg/dl  POCT GLYCOSYLATED HEMOGLOBIN (HGB A1C)      Result Value Range   Hemoglobin A1C 6.3     [facility] reading (PRIMARY) by  Dr. Katrinka Blazing.  CXR:  ?small RUL infiltrate.     Assessment & Plan:  Cough - Plan: DG Chest 2 View  Pulmonary fibrosis  COPD exacerbation - Plan: POCT CBC  Other abnormal glucose - Plan: POCT glucose (manual entry), POCT glycosylated hemoglobin (Hb A1C)  Pure hypercholesterolemia - Plan: Comprehensive metabolic panel   1.  Acute Bronchitis:  New.  Due to history of recurrent pneumonia requiring prolonged hospitalization in addition to comorbidities, will treat aggressively with Levaquin 750mg  daily.   2.  COPD exacerbation: New.  Increase Prednisone to 40mg  daily; increase Albuterol nebulizer to qid for next week.  To contact Fleming/pulmonology tomorrow for further consultation; copy of xray provided. 3.  Pulmonary fibrosis:  New diagnosis; s/p lung biopsy.  Has received disability; requiring PRN oxygen.  Continue supplemental oxygen with  acute infection. 4.  Depression with anxiety: stable; refills provided. 5.   Hyperlipidemia: uncontrolled; rx for Lipitor provided.   6.  Glucose Intolerance: stable despite recent increase in Prednisone doses. 7. GERD: controlled; refill provided. 8. Altered Mental Status: stable; has received disability.  Meds ordered this encounter  Medications  . budesonide (PULMICORT) 180 MCG/ACT inhaler    Sig: Inhale 1 puff into the lungs 2 (two) times daily.  Marland Kitchen levofloxacin (LEVAQUIN) 750 MG tablet    Sig: Take 1 tablet (750 mg total) by mouth daily.    Dispense:  10 tablet    Refill:  0  . sertraline (ZOLOFT) 100 MG tablet    Sig: Take 1 tablet (100 mg total) by mouth daily.    Dispense:  30 tablet    Refill:  5  . atorvastatin (LIPITOR) 20 MG tablet    Sig: Take 1 tablet (20 mg total) by mouth daily.    Dispense:  30 tablet    Refill:  5  . omeprazole (PRILOSEC) 20 MG capsule    Sig: Take 1 capsule (20 mg total) by mouth daily.    Dispense:  30 capsule    Refill:  11  . LORazepam (ATIVAN) 0.5 MG tablet    Sig: Take 1 tablet (0.5 mg total) by mouth at bedtime as needed for anxiety.    Dispense:  30 tablet    Refill:  5

## 2013-01-14 NOTE — Patient Instructions (Addendum)
1.  INCREASE NEBULIZER TO FOUR TIMES DAILY FOR NEXT WEEK. 2.  INCREASE PREDNISONE TO 40MG  DAILY FOR NEXT FIVE DAYS; CONTACT DR. FLEMING TOMORROW REGARDING FURTHER MANAGEMENT.

## 2013-07-06 ENCOUNTER — Other Ambulatory Visit: Payer: Self-pay | Admitting: Family Medicine

## 2013-07-14 ENCOUNTER — Other Ambulatory Visit: Payer: Self-pay | Admitting: Family Medicine

## 2013-07-16 NOTE — Telephone Encounter (Signed)
Scheduling -- patient is due for six month follow-up on anxiety, hypercholesterolemia.  Please schedule patient for follow-up appointment in upcoming month and please schedule CPE in upcoming 3-6 months.

## 2013-07-16 NOTE — Telephone Encounter (Signed)
Please call in refill of Lorazepam as approved.   

## 2013-07-17 ENCOUNTER — Inpatient Hospital Stay: Payer: Self-pay | Admitting: Specialist

## 2013-07-17 LAB — COMPREHENSIVE METABOLIC PANEL
Albumin: 3.1 g/dL — ABNORMAL LOW (ref 3.4–5.0)
Alkaline Phosphatase: 106 U/L
Anion Gap: 7 (ref 7–16)
BUN: 11 mg/dL (ref 7–18)
Bilirubin,Total: 0.8 mg/dL (ref 0.2–1.0)
Calcium, Total: 9.1 mg/dL (ref 8.5–10.1)
Chloride: 99 mmol/L (ref 98–107)
Co2: 29 mmol/L (ref 21–32)
Creatinine: 0.92 mg/dL (ref 0.60–1.30)
EGFR (African American): >60
EGFR (Non-African Amer.): >60
Glucose: 157 mg/dL — ABNORMAL HIGH (ref 65–99)
Osmolality: 273 (ref 275–301)
Potassium: 3.4 mmol/L — ABNORMAL LOW (ref 3.5–5.1)
SGOT(AST): 33 U/L (ref 15–37)
SGPT (ALT): 14 U/L (ref 12–78)
Sodium: 135 mmol/L — ABNORMAL LOW (ref 136–145)
Total Protein: 7.4 g/dL (ref 6.4–8.2)

## 2013-07-17 LAB — CBC
HCT: 37 % — ABNORMAL LOW (ref 40.0–52.0)
HGB: 12.9 g/dL — ABNORMAL LOW (ref 13.0–18.0)
MCH: 32.5 pg (ref 26.0–34.0)
MCHC: 34.7 g/dL (ref 32.0–36.0)
MCV: 94 fL (ref 80–100)
Platelet: 256 10*3/uL (ref 150–440)
RBC: 3.96 10*6/uL — ABNORMAL LOW (ref 4.40–5.90)
RDW: 15.2 % — ABNORMAL HIGH (ref 11.5–14.5)
WBC: 12.4 10*3/uL — ABNORMAL HIGH (ref 3.8–10.6)

## 2013-07-17 LAB — TROPONIN I: Troponin-I: <0.02 ng/mL

## 2013-07-17 NOTE — Telephone Encounter (Signed)
Left message for him to call us back for appt scheduling.

## 2013-07-18 DIAGNOSIS — R0602 Shortness of breath: Secondary | ICD-10-CM

## 2013-07-18 LAB — BASIC METABOLIC PANEL
Anion Gap: 5 — ABNORMAL LOW (ref 7–16)
BUN: 12 mg/dL (ref 7–18)
Calcium, Total: 8.5 mg/dL (ref 8.5–10.1)
Chloride: 105 mmol/L (ref 98–107)
Co2: 27 mmol/L (ref 21–32)
Creatinine: 0.85 mg/dL (ref 0.60–1.30)
EGFR (African American): >60
EGFR (Non-African Amer.): >60
Glucose: 207 mg/dL — ABNORMAL HIGH (ref 65–99)
Osmolality: 280 (ref 275–301)
Potassium: 3.6 mmol/L (ref 3.5–5.1)
Sodium: 137 mmol/L (ref 136–145)

## 2013-07-18 LAB — CBC WITH DIFFERENTIAL/PLATELET
Basophil #: 0 10*3/uL (ref 0.0–0.1)
Basophil %: 0.2 %
Eosinophil #: 0 10*3/uL (ref 0.0–0.7)
Eosinophil %: 0 %
HCT: 35.7 % — ABNORMAL LOW (ref 40.0–52.0)
HGB: 12.7 g/dL — ABNORMAL LOW (ref 13.0–18.0)
Lymphocyte #: 0.3 10*3/uL — ABNORMAL LOW (ref 1.0–3.6)
Lymphocyte %: 2.1 %
MCH: 33.4 pg (ref 26.0–34.0)
MCHC: 35.6 g/dL (ref 32.0–36.0)
MCV: 94 fL (ref 80–100)
Monocyte #: 0.1 x10 3/mm — ABNORMAL LOW (ref 0.2–1.0)
Monocyte %: 0.7 %
Neutrophil #: 13.9 10*3/uL — ABNORMAL HIGH (ref 1.4–6.5)
Neutrophil %: 97 %
Platelet: 268 10*3/uL (ref 150–440)
RBC: 3.8 10*6/uL — ABNORMAL LOW (ref 4.40–5.90)
RDW: 15 % — ABNORMAL HIGH (ref 11.5–14.5)
WBC: 14.3 10*3/uL — ABNORMAL HIGH (ref 3.8–10.6)

## 2013-07-19 LAB — BASIC METABOLIC PANEL
Anion Gap: 8 (ref 7–16)
BUN: 17 mg/dL (ref 7–18)
Calcium, Total: 8 mg/dL — ABNORMAL LOW (ref 8.5–10.1)
Chloride: 109 mmol/L — ABNORMAL HIGH (ref 98–107)
Co2: 23 mmol/L (ref 21–32)
Creatinine: 0.92 mg/dL (ref 0.60–1.30)
EGFR (African American): >60
EGFR (Non-African Amer.): >60
Glucose: 155 mg/dL — ABNORMAL HIGH (ref 65–99)
Osmolality: 284 (ref 275–301)
Potassium: 3.5 mmol/L (ref 3.5–5.1)
Sodium: 140 mmol/L (ref 136–145)

## 2013-07-19 LAB — CBC WITH DIFFERENTIAL/PLATELET
Basophil #: 0 10*3/uL (ref 0.0–0.1)
Basophil %: 0.1 %
Eosinophil #: 0 10*3/uL (ref 0.0–0.7)
Eosinophil %: 0 %
HCT: 33.8 % — ABNORMAL LOW (ref 40.0–52.0)
HGB: 11.9 g/dL — ABNORMAL LOW (ref 13.0–18.0)
Lymphocyte #: 0.4 10*3/uL — ABNORMAL LOW (ref 1.0–3.6)
Lymphocyte %: 1.8 %
MCH: 32.7 pg (ref 26.0–34.0)
MCHC: 35.1 g/dL (ref 32.0–36.0)
MCV: 93 fL (ref 80–100)
Monocyte #: 0.5 x10 3/mm (ref 0.2–1.0)
Monocyte %: 2.3 %
Neutrophil #: 20.1 10*3/uL — ABNORMAL HIGH (ref 1.4–6.5)
Neutrophil %: 95.8 %
Platelet: 295 10*3/uL (ref 150–440)
RBC: 3.62 10*6/uL — ABNORMAL LOW (ref 4.40–5.90)
RDW: 14.8 % — ABNORMAL HIGH (ref 11.5–14.5)
WBC: 21 10*3/uL — ABNORMAL HIGH (ref 3.8–10.6)

## 2013-07-19 LAB — VANCOMYCIN, TROUGH: Vancomycin, Trough: 21 ug/mL (ref 10–20)

## 2013-07-19 LAB — URINE CULTURE

## 2013-07-20 LAB — CBC WITH DIFFERENTIAL/PLATELET
Basophil #: 0 10*3/uL (ref 0.0–0.1)
Basophil %: 0.2 %
Eosinophil #: 0 10*3/uL (ref 0.0–0.7)
Eosinophil %: 0 %
HCT: 35.6 % — ABNORMAL LOW (ref 40.0–52.0)
HGB: 12.4 g/dL — ABNORMAL LOW (ref 13.0–18.0)
Lymphocyte #: 0.4 10*3/uL — ABNORMAL LOW (ref 1.0–3.6)
Lymphocyte %: 3.6 %
MCH: 32.1 pg (ref 26.0–34.0)
MCHC: 34.9 g/dL (ref 32.0–36.0)
MCV: 92 fL (ref 80–100)
Monocyte #: 0.5 x10 3/mm (ref 0.2–1.0)
Monocyte %: 4.2 %
Neutrophil #: 11.4 10*3/uL — ABNORMAL HIGH (ref 1.4–6.5)
Neutrophil %: 92 %
Platelet: 288 10*3/uL (ref 150–440)
RBC: 3.87 10*6/uL — ABNORMAL LOW (ref 4.40–5.90)
RDW: 15 % — ABNORMAL HIGH (ref 11.5–14.5)
WBC: 12.4 10*3/uL — ABNORMAL HIGH (ref 3.8–10.6)

## 2013-07-22 ENCOUNTER — Ambulatory Visit: Payer: Self-pay | Admitting: Internal Medicine

## 2013-07-22 LAB — CBC WITH DIFFERENTIAL/PLATELET
Basophil #: 0 10*3/uL (ref 0.0–0.1)
Basophil %: 0.2 %
Eosinophil #: 0 10*3/uL (ref 0.0–0.7)
Eosinophil %: 0 %
HCT: 40.9 % (ref 40.0–52.0)
HGB: 14.4 g/dL (ref 13.0–18.0)
Lymphocyte #: 0.5 10*3/uL — ABNORMAL LOW (ref 1.0–3.6)
Lymphocyte %: 3.8 %
MCH: 32.6 pg (ref 26.0–34.0)
MCHC: 35.2 g/dL (ref 32.0–36.0)
MCV: 93 fL (ref 80–100)
Monocyte #: 0.4 x10 3/mm (ref 0.2–1.0)
Monocyte %: 3 %
Neutrophil #: 11.7 10*3/uL — ABNORMAL HIGH (ref 1.4–6.5)
Neutrophil %: 93 %
Platelet: 319 10*3/uL (ref 150–440)
RBC: 4.42 10*6/uL (ref 4.40–5.90)
RDW: 14.7 % — ABNORMAL HIGH (ref 11.5–14.5)
WBC: 12.6 10*3/uL — ABNORMAL HIGH (ref 3.8–10.6)

## 2013-07-22 LAB — BASIC METABOLIC PANEL
Anion Gap: 2 — ABNORMAL LOW (ref 7–16)
BUN: 23 mg/dL — ABNORMAL HIGH (ref 7–18)
Calcium, Total: 9.4 mg/dL (ref 8.5–10.1)
Chloride: 104 mmol/L (ref 98–107)
Co2: 28 mmol/L (ref 21–32)
Creatinine: 0.88 mg/dL (ref 0.60–1.30)
EGFR (African American): >60
EGFR (Non-African Amer.): >60
Glucose: 181 mg/dL — ABNORMAL HIGH (ref 65–99)
Osmolality: 277 (ref 275–301)
Potassium: 4.4 mmol/L (ref 3.5–5.1)
Sodium: 134 mmol/L — ABNORMAL LOW (ref 136–145)

## 2013-07-22 LAB — SEDIMENTATION RATE: Erythrocyte Sed Rate: 59 mm/hr — ABNORMAL HIGH (ref 0–20)

## 2013-07-22 LAB — CULTURE, BLOOD (SINGLE)

## 2013-07-23 LAB — CBC WITH DIFFERENTIAL/PLATELET
Basophil #: 0 10*3/uL (ref 0.0–0.1)
Basophil %: 0.3 %
Eosinophil #: 0 10*3/uL (ref 0.0–0.7)
Eosinophil %: 0 %
HCT: 39.5 % — ABNORMAL LOW (ref 40.0–52.0)
HGB: 13.7 g/dL (ref 13.0–18.0)
Lymphocyte #: 0.6 10*3/uL — ABNORMAL LOW (ref 1.0–3.6)
Lymphocyte %: 5.7 %
MCH: 32 pg (ref 26.0–34.0)
MCHC: 34.6 g/dL (ref 32.0–36.0)
MCV: 93 fL (ref 80–100)
Monocyte #: 0.4 x10 3/mm (ref 0.2–1.0)
Monocyte %: 3.3 %
Neutrophil #: 10.2 10*3/uL — ABNORMAL HIGH (ref 1.4–6.5)
Neutrophil %: 90.7 %
Platelet: 310 10*3/uL (ref 150–440)
RBC: 4.27 10*6/uL — ABNORMAL LOW (ref 4.40–5.90)
RDW: 14.6 % — ABNORMAL HIGH (ref 11.5–14.5)
WBC: 11.3 10*3/uL — ABNORMAL HIGH (ref 3.8–10.6)

## 2013-07-23 LAB — BASIC METABOLIC PANEL
Anion Gap: 9 (ref 7–16)
BUN: 30 mg/dL — ABNORMAL HIGH (ref 7–18)
Calcium, Total: 8.6 mg/dL (ref 8.5–10.1)
Chloride: 99 mmol/L (ref 98–107)
Co2: 24 mmol/L (ref 21–32)
Creatinine: 0.87 mg/dL (ref 0.60–1.30)
EGFR (African American): >60
EGFR (Non-African Amer.): >60
Glucose: 226 mg/dL — ABNORMAL HIGH (ref 65–99)
Osmolality: 278 (ref 275–301)
Potassium: 4.5 mmol/L (ref 3.5–5.1)
Sodium: 132 mmol/L — ABNORMAL LOW (ref 136–145)

## 2013-07-23 LAB — PRO B NATRIURETIC PEPTIDE: B-Type Natriuretic Peptide: 108 pg/mL (ref 0–125)

## 2013-07-24 LAB — CBC WITH DIFFERENTIAL/PLATELET
Basophil #: 0.1 10*3/uL (ref 0.0–0.1)
Basophil %: 0.7 %
Eosinophil #: 0 10*3/uL (ref 0.0–0.7)
Eosinophil %: 0 %
HCT: 38.8 % — ABNORMAL LOW (ref 40.0–52.0)
HGB: 13.9 g/dL (ref 13.0–18.0)
Lymphocyte #: 0.8 10*3/uL — ABNORMAL LOW (ref 1.0–3.6)
Lymphocyte %: 5.6 %
MCH: 32.5 pg (ref 26.0–34.0)
MCHC: 35.7 g/dL (ref 32.0–36.0)
MCV: 91 fL (ref 80–100)
Monocyte #: 0.6 x10 3/mm (ref 0.2–1.0)
Monocyte %: 4.3 %
Neutrophil #: 13.3 10*3/uL — ABNORMAL HIGH (ref 1.4–6.5)
Neutrophil %: 89.4 %
Platelet: 278 10*3/uL (ref 150–440)
RBC: 4.27 10*6/uL — ABNORMAL LOW (ref 4.40–5.90)
RDW: 14.7 % — ABNORMAL HIGH (ref 11.5–14.5)
WBC: 14.9 10*3/uL — ABNORMAL HIGH (ref 3.8–10.6)

## 2013-07-24 LAB — BASIC METABOLIC PANEL
Anion Gap: 7 (ref 7–16)
BUN: 31 mg/dL — ABNORMAL HIGH (ref 7–18)
Calcium, Total: 9 mg/dL (ref 8.5–10.1)
Chloride: 97 mmol/L — ABNORMAL LOW (ref 98–107)
Co2: 27 mmol/L (ref 21–32)
Creatinine: 0.75 mg/dL (ref 0.60–1.30)
EGFR (African American): >60
EGFR (Non-African Amer.): >60
Glucose: 232 mg/dL — ABNORMAL HIGH (ref 65–99)
Osmolality: 277 (ref 275–301)
Potassium: 4.5 mmol/L (ref 3.5–5.1)
Sodium: 131 mmol/L — ABNORMAL LOW (ref 136–145)

## 2013-07-25 LAB — BASIC METABOLIC PANEL
Anion Gap: 3 — ABNORMAL LOW (ref 7–16)
BUN: 34 mg/dL — ABNORMAL HIGH (ref 7–18)
Calcium, Total: 8.7 mg/dL (ref 8.5–10.1)
Chloride: 99 mmol/L (ref 98–107)
Co2: 31 mmol/L (ref 21–32)
Creatinine: 0.67 mg/dL (ref 0.60–1.30)
EGFR (African American): >60
EGFR (Non-African Amer.): >60
Glucose: 227 mg/dL — ABNORMAL HIGH (ref 65–99)
Osmolality: 281 (ref 275–301)
Potassium: 4.3 mmol/L (ref 3.5–5.1)
Sodium: 133 mmol/L — ABNORMAL LOW (ref 136–145)

## 2013-07-25 LAB — CBC WITH DIFFERENTIAL/PLATELET
HCT: 38.9 % — ABNORMAL LOW (ref 40.0–52.0)
HGB: 13.7 g/dL (ref 13.0–18.0)
Lymphocytes: 8 %
MCH: 32.2 pg (ref 26.0–34.0)
MCHC: 35.3 g/dL (ref 32.0–36.0)
MCV: 91 fL (ref 80–100)
Monocytes: 5 %
Platelet: 269 10*3/uL (ref 150–440)
RBC: 4.26 10*6/uL — ABNORMAL LOW (ref 4.40–5.90)
RDW: 14.9 % — ABNORMAL HIGH (ref 11.5–14.5)
Segmented Neutrophils: 87 %
WBC: 13.8 10*3/uL — ABNORMAL HIGH (ref 3.8–10.6)

## 2013-07-29 LAB — PLATELET COUNT: Platelet: 207 10*3/uL (ref 150–440)

## 2013-08-08 ENCOUNTER — Telehealth: Payer: Self-pay | Admitting: Radiology

## 2013-08-08 DIAGNOSIS — R29898 Other symptoms and signs involving the musculoskeletal system: Secondary | ICD-10-CM

## 2013-08-08 NOTE — Telephone Encounter (Signed)
Patient has lower extremity weakness [facility] is requesting eval and treat for PT please advise, I can call her,  [phone]

## 2013-08-08 NOTE — Telephone Encounter (Signed)
PT evaluation approved.

## 2013-08-09 NOTE — Telephone Encounter (Signed)
Called her to advise. Put order in epic.

## 2013-08-16 ENCOUNTER — Telehealth: Payer: Self-pay | Admitting: Radiology

## 2013-08-16 NOTE — Telephone Encounter (Signed)
Patient needs home health PT, [facility] already has order

## 2013-08-22 ENCOUNTER — Ambulatory Visit: Payer: Self-pay | Admitting: Internal Medicine

## 2013-08-28 ENCOUNTER — Telehealth: Payer: Self-pay | Admitting: Radiology

## 2013-08-28 NOTE — Telephone Encounter (Signed)
[facility] is calling wanting verbal order for PT 2x wk for 1 wk and 1 x wk for 3 wks to total 5 visits, call her to advise if you agree with plan , phone [phone]

## 2013-08-28 NOTE — Telephone Encounter (Signed)
Agree with plan.  Please give verbal to [facility] at [facility].

## 2013-08-28 NOTE — Telephone Encounter (Signed)
Called [facility] to give verbal order, left message.

## 2013-09-20 ENCOUNTER — Telehealth: Payer: Self-pay

## 2013-09-20 NOTE — Telephone Encounter (Signed)
Home health nurse from [facility] wants to let Dr. Tamala Julian know that patient has decided to cancel his last PT appointment and states that he wants to be discharged. Patient will F/U with his pulmonologist next week. Please call Jackey Loge at [phone]

## 2013-09-22 NOTE — Telephone Encounter (Signed)
Return call to Jackey Loge; OK to discharge patient from PT.  Please see if Angie Fava has any concerns or questions further for me.

## 2013-09-23 NOTE — Telephone Encounter (Signed)
Spoke to [facility] she is concerned that pt is not going to have pulmonary rehab. His follow up appt he will discuss this with the specialist.

## 2013-10-09 ENCOUNTER — Telehealth: Payer: Self-pay

## 2013-10-09 NOTE — Telephone Encounter (Signed)
pts home health certification and plan of care was faxed to [phone] (the fax number I was given from [facility]) today.

## 2014-01-10 ENCOUNTER — Other Ambulatory Visit: Payer: Self-pay | Admitting: Family Medicine

## 2014-02-23 ENCOUNTER — Other Ambulatory Visit: Payer: Self-pay | Admitting: Family Medicine

## 2014-02-26 ENCOUNTER — Encounter: Payer: Self-pay | Admitting: Family Medicine

## 2014-02-26 ENCOUNTER — Ambulatory Visit (INDEPENDENT_AMBULATORY_CARE_PROVIDER_SITE_OTHER): Payer: 59 | Admitting: Family Medicine

## 2014-02-26 VITALS — BP 130/76 | HR 77 | Temp 98.8°F | Resp 16 | Ht 68.5 in | Wt 186.4 lb

## 2014-02-26 DIAGNOSIS — J8410 Pulmonary fibrosis, unspecified: Secondary | ICD-10-CM

## 2014-02-26 DIAGNOSIS — F32A Depression, unspecified: Secondary | ICD-10-CM

## 2014-02-26 DIAGNOSIS — J4489 Other specified chronic obstructive pulmonary disease: Secondary | ICD-10-CM

## 2014-02-26 DIAGNOSIS — E785 Hyperlipidemia, unspecified: Secondary | ICD-10-CM

## 2014-02-26 DIAGNOSIS — F329 Major depressive disorder, single episode, unspecified: Secondary | ICD-10-CM

## 2014-02-26 DIAGNOSIS — Z720 Tobacco use: Secondary | ICD-10-CM

## 2014-02-26 DIAGNOSIS — F17200 Nicotine dependence, unspecified, uncomplicated: Secondary | ICD-10-CM

## 2014-02-26 DIAGNOSIS — E7800 Pure hypercholesterolemia, unspecified: Secondary | ICD-10-CM

## 2014-02-26 DIAGNOSIS — K219 Gastro-esophageal reflux disease without esophagitis: Secondary | ICD-10-CM

## 2014-02-26 DIAGNOSIS — J449 Chronic obstructive pulmonary disease, unspecified: Secondary | ICD-10-CM

## 2014-02-26 DIAGNOSIS — F3289 Other specified depressive episodes: Secondary | ICD-10-CM

## 2014-02-26 DIAGNOSIS — R7309 Other abnormal glucose: Secondary | ICD-10-CM

## 2014-02-26 LAB — CBC WITH DIFFERENTIAL/PLATELET
Basophils Absolute: 0 10*3/uL (ref 0.0–0.1)
Basophils Relative: 0 % (ref 0–1)
Eosinophils Absolute: 0.1 10*3/uL (ref 0.0–0.7)
Eosinophils Relative: 1 % (ref 0–5)
HCT: 38.3 % — ABNORMAL LOW (ref 39.0–52.0)
Hemoglobin: 13.5 g/dL (ref 13.0–17.0)
Lymphocytes Relative: 21 % (ref 12–46)
Lymphs Abs: 1.7 10*3/uL (ref 0.7–4.0)
MCH: 30.7 pg (ref 26.0–34.0)
MCHC: 35.2 g/dL (ref 30.0–36.0)
MCV: 87 fL (ref 78.0–100.0)
Monocytes Absolute: 0.6 10*3/uL (ref 0.1–1.0)
Monocytes Relative: 7 % (ref 3–12)
Neutro Abs: 5.7 10*3/uL (ref 1.7–7.7)
Neutrophils Relative %: 71 % (ref 43–77)
Platelets: 264 10*3/uL (ref 150–400)
RBC: 4.4 MIL/uL (ref 4.22–5.81)
RDW: 14 % (ref 11.5–15.5)
WBC: 8 10*3/uL (ref 4.0–10.5)

## 2014-02-26 MED ORDER — OMEPRAZOLE 20 MG PO CPDR: 20.0000 mg | DELAYED_RELEASE_CAPSULE | Freq: Every day | ORAL | Status: DC

## 2014-02-26 MED ORDER — ATORVASTATIN CALCIUM 20 MG PO TABS: 20.0000 mg | ORAL_TABLET | Freq: Every day | ORAL | Status: DC

## 2014-02-26 MED ORDER — SERTRALINE HCL 100 MG PO TABS: 150.0000 mg | ORAL_TABLET | Freq: Every day | ORAL | Status: DC

## 2014-02-26 NOTE — Progress Notes (Signed)
This chart was scribed for Kirk Honour, MD by Kirk Robinson, Scribe. This patient was seen in room 23 and the patient's care was started at 3:48 PM.  Subjective:    Patient ID: Kirk Robinson, male    DOB: 04-Sep-1955, 58 y.o.   MRN: [id number]  02/26/2014  Medication Refill   HPI  HPI Comments: Kirk Robinson is a 58 y.o. male with a history of COPD, pulmonary fibrosis, anxiety/depression, and hypercholesteremia who presents to the [facility] requesting a medication refill. He was last seen here on 01/12/14. He states that he was recently admitted to ICU in November 2014 for 2-3 weeks for breathing issues/COPD exacerbation. He is followed by his Pulmonologist, Dr. Raul Del. He states that he has also dealt with bilateral upper leg pain after prolonged hospitalization; he was discharged in a wheelchair with the fear that he would never walk again; he slowly gained his strength back but still is uncertain of etiology of severe leg pain. He states that he has mostly recovered from these symptoms and that he is now back to his baseline SOB. He states that he is on supplemental oxygen at night-time. He is currently on 30m of Prednisone. He is taking Spiriva currently but he is no longer on Pulmicort.   He states that he is taking Lipitor, Omeprazole, Zoloft and Diazepam as prescribed. Dr. FRaul Delis prescribing his Diazepam at this time.  He states that he has some sleep disturbance at baseline, and that the Diazepam seems to be helping with this. He denies any recent heartburn. He states that about once every 2 months, he has 1-2 day episodes of feeling helpless. He states that there are no known triggers for this, and that this just happens. He states that he feels like an invalid and he is limited in how much he can do. His wife states that she believes that increasing his Zoloft dosage might help. He is a current every day smoker of 0.5 to 0.75 packs/day. He denies any chest  pain. He sees his Pain Management doctor once every 2 months and states that his pain is well-controlled.  He continues to keep his granddaughter daily during the week; this is a great motivation and mood booster for patient.  Review of Systems  Constitutional: Negative for fever, chills, diaphoresis and fatigue.  Respiratory: Positive for shortness of breath. Negative for cough, wheezing and stridor.   Cardiovascular: Negative for chest pain, palpitations and leg swelling.  Gastrointestinal: Negative for nausea, vomiting, abdominal pain, diarrhea and constipation.  Endocrine: Negative for cold intolerance, heat intolerance, polydipsia, polyphagia and polyuria.  Musculoskeletal: Positive for arthralgias and myalgias (bilateral upper leg pain).  Neurological: Negative for dizziness, tremors, seizures, syncope, facial asymmetry, speech difficulty, weakness, light-headedness, numbness and headaches.  Psychiatric/Behavioral: Positive for sleep disturbance and dysphoric mood. Negative for suicidal ideas and self-injury. The patient is not nervous/anxious.      Past Medical History  Diagnosis Date  . Altered mental status   . Pneumonia, organism unspecified   . Memory loss   . Osteoporosis, unspecified     bone density per Morivati in 2012  . Iodine hypothyroidism   . Anxiety state, unspecified   . Pure hypercholesterolemia   . Hematuria 03/16/2007  . Allergic rhinitis, cause unspecified   . Alcohol use 03/16/2007    patient risk factors  . Esophageal reflux   . Tobacco use disorder 03/16/2007    patient risk factors  . Unspecified late effects of  cerebrovascular disease 06/08/2007  . Cervicalgia 09/17/2007  . Diplopia 12/21/2007  . Other abnormal glucose 04/08/2008  . Personal history of colonic polyps   . Other diseases of lung, not elsewhere classified 01/23/2009    s/p Fleming/pulmonology consult; intolerant  to Advair  . Gastritis 05/13/2009  . Peripheral vascular disease,  unspecified 12/24/2009  . Unspecified hypothyroidism 01/07/2010   Past Surgical History  Procedure Laterality Date  . Eye surgery  2010  . 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 Admission 11/2011      altered mental status/confusion with syncope.  CT head negative, MRI brain negative, EEG: diffuse slowing but no  seizure acitivity.  Labs negative.  [facility].  Marland Kitchen Neuropsychiatric evaluation  04/2012    poor short-term memory, poor recall, delayed reaction time; permanently disabled.      Allergies  Allergen Reactions  . Erythromycin   . Advair Diskus [Fluticasone-Salmeterol]     Short of breath  . Darvocet [Propoxyphene N-Acetaminophen]   . Doxycycline Hyclate Swelling    Tongue swelling, upset stomach  . Septra [Sulfamethoxazole-Trimethoprim]    Current Outpatient Prescriptions  Medication Sig Dispense Refill  . atorvastatin (LIPITOR) 20 MG tablet TAKE 1 TABLET (20 MG TOTAL) BY MOUTH DAILY.  30 tablet  5  . atorvastatin (LIPITOR) 20 MG tablet Take 1 tablet (20 mg total) by mouth daily. PATIENT NEEDS OFFICE VISIT FOR ADDITIONAL REFILLS  30 tablet  5  . Morphine Sulfate, Concentrate, 100 MG/5ML SOLN Take 100 mg by mouth 3 (three) times daily.      Marland Kitchen omeprazole (PRILOSEC) 20 MG capsule Take 1 capsule (20 mg total) by mouth daily.  30 capsule  11  . predniSONE (STERAPRED UNI-PAK) 5 MG TABS Take 5 mg by mouth every other day.      . sertraline (ZOLOFT) 100 MG tablet Take 1 tablet (100 mg total) by mouth daily. PATIENT NEEDS OFFICE VISIT FOR ADDITIONAL REFILLS  30 tablet  0  . sertraline (ZOLOFT) 100 MG tablet Take 1.5 tablets (150 mg total) by mouth daily.  45 tablet  5  . tapentadol (NUCYNTA) 50 MG TABS Take 75 mg by mouth daily.      Marland Kitchen thyroid (ARMOUR) 120 MG tablet Take 120 mg by mouth daily.      Marland Kitchen tiotropium (SPIRIVA) 18 MCG inhalation capsule Place 18 mcg into inhaler and inhale daily.      . Vitamin D, Ergocalciferol, (DRISDOL) 50000 UNITS CAPS Take 50,000 Units by mouth  daily.      Marland Kitchen LORazepam (ATIVAN) 0.5 MG tablet TAKE 1 TABLET BY MOUTH AT BEDTIME AS NEEDED FOR ANXIETY  30 tablet  0   No current facility-administered medications for this visit.   History   Social History  . Marital Status: Married    Spouse Name: N/A    Number of Children: 2  . Years of Education: N/A   Occupational History  .      works full time 40 hrs   Social History Main Topics  . Smoking status: Current Every Day Smoker -- 1.00 packs/day for 30 years  . Smokeless tobacco: Not on file  . Alcohol Use: No  . Drug Use: No  . Sexual Activity: Not on file   Other Topics Concern  . Not on file   Social History Narrative   Married x 46 years. Children ages 59,20. 1 grandchild and one on the way. Caffeine use: Carbonated beverages, Pepsi: Coffee, many servings a day Pepsi  3-4  Coffee 2 cups. Smoke detectors in home. Always wears seatbelts. Exercise: Inactive. No guns in the home.        Objective:    BP 130/76  Pulse 77  Temp(Src) 98.8 F (37.1 C) (Oral)  Resp 16  Ht 5' 8.5" (1.74 m)  Wt 186 lb 6.4 oz (84.55 kg)  BMI 27.93 kg/m2  SpO2 94%  Physical Exam  Nursing note and vitals reviewed. Constitutional: He is oriented to person, place, and time. He appears well-developed and well-nourished. No distress.  HENT:  Head: Normocephalic and atraumatic.  Right Ear: External ear normal.  Left Ear: External ear normal.  Nose: Nose normal.  Mouth/Throat: Oropharynx is clear and moist.  Eyes: Conjunctivae and EOM are normal. Pupils are equal, round, and reactive to light.  Neck: Normal range of motion. Neck supple. Carotid bruit is not present. No tracheal deviation present. No thyromegaly present.  Cardiovascular: Normal rate, regular rhythm, normal heart sounds and intact distal pulses.  Exam reveals no gallop and no friction rub.   No murmur heard. Pulmonary/Chest: Effort normal and breath sounds normal. No respiratory distress. He has no wheezes. He has no rales.    Distant breath sounds throughout.  Abdominal: Soft. Bowel sounds are normal. He exhibits no distension and no mass. There is no tenderness. There is no rebound and no guarding.  Musculoskeletal: Normal range of motion.  Lymphadenopathy:    He has no cervical adenopathy.  Neurological: He is alert and oriented to person, place, and time. No cranial nerve deficit.  Skin: Skin is warm and dry. No rash noted. He is not diaphoretic.  Psychiatric: He has a normal mood and affect. His behavior is normal.   Results for orders placed in visit on 02/26/14  CBC WITH DIFFERENTIAL      Result Value Ref Range   WBC 8.0  4.0 - 10.5 K/uL   RBC 4.40  4.22 - 5.81 MIL/uL   Hemoglobin 13.5  13.0 - 17.0 g/dL   HCT 38.3 (*) 39.0 - 52.0 %   MCV 87.0  78.0 - 100.0 fL   MCH 30.7  26.0 - 34.0 pg   MCHC 35.2  30.0 - 36.0 g/dL   RDW 14.0  11.5 - 15.5 %   Platelets 264  150 - 400 K/uL   Neutrophils Relative % 71  43 - 77 %   Neutro Abs 5.7  1.7 - 7.7 K/uL   Lymphocytes Relative 21  12 - 46 %   Lymphs Abs 1.7  0.7 - 4.0 K/uL   Monocytes Relative 7  3 - 12 %   Monocytes Absolute 0.6  0.1 - 1.0 K/uL   Eosinophils Relative 1  0 - 5 %   Eosinophils Absolute 0.1  0.0 - 0.7 K/uL   Basophils Relative 0  0 - 1 %   Basophils Absolute 0.0  0.0 - 0.1 K/uL   Smear Review Criteria for review not met    COMPREHENSIVE METABOLIC PANEL      Result Value Ref Range   Sodium 138  135 - 145 mEq/L   Potassium 4.9  3.5 - 5.3 mEq/L   Chloride 98  96 - 112 mEq/L   CO2 30  19 - 32 mEq/L   Glucose, Bld 103 (*) 70 - 99 mg/dL   BUN 7  6 - 23 mg/dL   Creat 0.77  0.50 - 1.35 mg/dL   Total Bilirubin 0.4  0.2 - 1.2 mg/dL   Alkaline Phosphatase 70  39 - 117 U/L   AST 15  0 - 37 U/L   ALT 13  0 - 53 U/L   Total Protein 7.0  6.0 - 8.3 g/dL   Albumin 4.5  3.5 - 5.2 g/dL   Calcium 9.7  8.4 - 10.5 mg/dL  HEMOGLOBIN A1C      Result Value Ref Range   Hemoglobin A1C 6.2 (*) <5.7 %   Mean Plasma Glucose 131 (*) <117 mg/dL  LIPID PANEL       Result Value Ref Range   Cholesterol 209 (*) 0 - 200 mg/dL   Triglycerides 245 (*) <150 mg/dL   HDL 48  >39 mg/dL   Total CHOL/HDL Ratio 4.4     VLDL 49 (*) 0 - 40 mg/dL   LDL Cholesterol 112 (*) 0 - 99 mg/dL       Assessment & Plan:  Pure hypercholesterolemia - Plan: CBC with Differential, Comprehensive metabolic panel, Lipid panel  Other abnormal glucose - Plan: Hemoglobin A1c  Tobacco abuse  Pulmonary fibrosis  Hyperlipidemia  Gastroesophageal reflux disease, esophagitis presence not specified  Depression  Chronic obstructive pulmonary disease, unspecified COPD, unspecified chronic bronchitis type  1. Hypercholesterolemia: moderately controlled; obtain labs; refills provided. 2.  Glucose intolerance: persistent; weight gain in past year; recommend dietary modification.   3.  Tobacco abuse: pre-contemplative at this time despite COPD and pulmonary fibrosis. 4.  Pulmonary fibrosis: persistent; managed by pulmonology. 5.  GERD: controlled; no change in therapy; refill provided. 6. Depression: worsening; increase Zoloft 132m to 1.5 tablets daily.   7. COPD: moderate to severe; continue nighttime oxygen and Spiriva; managed by pulmonology.  Meds ordered this encounter  Medications  . atorvastatin (LIPITOR) 20 MG tablet    Sig: Take 1 tablet (20 mg total) by mouth daily. PATIENT NEEDS OFFICE VISIT FOR ADDITIONAL REFILLS    Dispense:  30 tablet    Refill:  5  . omeprazole (PRILOSEC) 20 MG capsule    Sig: Take 1 capsule (20 mg total) by mouth daily.    Dispense:  30 capsule    Refill:  11  . sertraline (ZOLOFT) 100 MG tablet    Sig: Take 1.5 tablets (150 mg total) by mouth daily.    Dispense:  45 tablet    Refill:  5    Return in about 6 months (around 08/29/2014) for complete physical examiniation.   I personally performed the services described in this documentation, which was scribed in my presence.  The recorded information has been reviewed and is  accurate.  KReginia Forts M.D.  [facility] StreetGSlatedale [place]  [phone] ([phone]

## 2014-02-27 LAB — COMPREHENSIVE METABOLIC PANEL
ALT: 13 U/L (ref 0–53)
AST: 15 U/L (ref 0–37)
Albumin: 4.5 g/dL (ref 3.5–5.2)
Alkaline Phosphatase: 70 U/L (ref 39–117)
BUN: 7 mg/dL (ref 6–23)
CO2: 30 mEq/L (ref 19–32)
Calcium: 9.7 mg/dL (ref 8.4–10.5)
Chloride: 98 mEq/L (ref 96–112)
Creat: 0.77 mg/dL (ref 0.50–1.35)
Glucose, Bld: 103 mg/dL — ABNORMAL HIGH (ref 70–99)
Potassium: 4.9 mEq/L (ref 3.5–5.3)
Sodium: 138 mEq/L (ref 135–145)
Total Bilirubin: 0.4 mg/dL (ref 0.2–1.2)
Total Protein: 7 g/dL (ref 6.0–8.3)

## 2014-02-27 LAB — HEMOGLOBIN A1C
Hgb A1c MFr Bld: 6.2 % — ABNORMAL HIGH (ref <5.7)
Mean Plasma Glucose: 131 mg/dL — ABNORMAL HIGH (ref <117)

## 2014-02-27 LAB — LIPID PANEL
Cholesterol: 209 mg/dL — ABNORMAL HIGH (ref 0–200)
HDL: 48 mg/dL (ref >39)
LDL Cholesterol: 112 mg/dL — ABNORMAL HIGH (ref 0–99)
Total CHOL/HDL Ratio: 4.4 Ratio
Triglycerides: 245 mg/dL — ABNORMAL HIGH (ref <150)
VLDL: 49 mg/dL — ABNORMAL HIGH (ref 0–40)

## 2014-05-05 LAB — COMPREHENSIVE METABOLIC PANEL
Albumin: 2.7 g/dL — ABNORMAL LOW (ref 3.4–5.0)
Alkaline Phosphatase: 87 U/L
Anion Gap: 5 — ABNORMAL LOW (ref 7–16)
BUN: 10 mg/dL (ref 7–18)
Bilirubin,Total: 0.3 mg/dL (ref 0.2–1.0)
Calcium, Total: 8.6 mg/dL (ref 8.5–10.1)
Chloride: 99 mmol/L (ref 98–107)
Co2: 33 mmol/L — ABNORMAL HIGH (ref 21–32)
Creatinine: 0.91 mg/dL (ref 0.60–1.30)
EGFR (African American): >60
EGFR (Non-African Amer.): >60
Glucose: 127 mg/dL — ABNORMAL HIGH (ref 65–99)
Osmolality: 274 (ref 275–301)
Potassium: 3.4 mmol/L — ABNORMAL LOW (ref 3.5–5.1)
SGOT(AST): 24 U/L (ref 15–37)
SGPT (ALT): 14 U/L
Sodium: 137 mmol/L (ref 136–145)
Total Protein: 7.3 g/dL (ref 6.4–8.2)

## 2014-05-05 LAB — CBC
HCT: 37.5 % — ABNORMAL LOW (ref 40.0–52.0)
HGB: 12.4 g/dL — ABNORMAL LOW (ref 13.0–18.0)
MCH: 29.7 pg (ref 26.0–34.0)
MCHC: 33.2 g/dL (ref 32.0–36.0)
MCV: 89 fL (ref 80–100)
Platelet: 263 10*3/uL (ref 150–440)
RBC: 4.19 10*6/uL — ABNORMAL LOW (ref 4.40–5.90)
RDW: 12.8 % (ref 11.5–14.5)
WBC: 10.1 10*3/uL (ref 3.8–10.6)

## 2014-05-05 LAB — TROPONIN I: Troponin-I: <0.02 ng/mL

## 2014-05-05 LAB — CK TOTAL AND CKMB (NOT AT ARMC)
CK, Total: 24 U/L — ABNORMAL LOW
CK-MB: 0.6 ng/mL (ref 0.5–3.6)

## 2014-05-06 ENCOUNTER — Inpatient Hospital Stay: Payer: Self-pay | Admitting: Internal Medicine

## 2014-05-07 LAB — CBC WITH DIFFERENTIAL/PLATELET
Basophil #: 0 10*3/uL (ref 0.0–0.1)
Basophil %: 0.1 %
Eosinophil #: 0 10*3/uL (ref 0.0–0.7)
Eosinophil %: 0 %
HCT: 34.4 % — ABNORMAL LOW (ref 40.0–52.0)
HGB: 11.4 g/dL — ABNORMAL LOW (ref 13.0–18.0)
Lymphocyte #: 0.3 10*3/uL — ABNORMAL LOW (ref 1.0–3.6)
Lymphocyte %: 1.9 %
MCH: 29.2 pg (ref 26.0–34.0)
MCHC: 33.2 g/dL (ref 32.0–36.0)
MCV: 88 fL (ref 80–100)
Monocyte #: 0.4 x10 3/mm (ref 0.2–1.0)
Monocyte %: 3 %
Neutrophil #: 13 10*3/uL — ABNORMAL HIGH (ref 1.4–6.5)
Neutrophil %: 95 %
Platelet: 257 10*3/uL (ref 150–440)
RBC: 3.9 10*6/uL — ABNORMAL LOW (ref 4.40–5.90)
RDW: 13 % (ref 11.5–14.5)
WBC: 13.7 10*3/uL — ABNORMAL HIGH (ref 3.8–10.6)

## 2014-05-07 LAB — BASIC METABOLIC PANEL
Anion Gap: 4 — ABNORMAL LOW (ref 7–16)
BUN: 11 mg/dL (ref 7–18)
Calcium, Total: 8.7 mg/dL (ref 8.5–10.1)
Chloride: 103 mmol/L (ref 98–107)
Co2: 30 mmol/L (ref 21–32)
Creatinine: 0.87 mg/dL (ref 0.60–1.30)
EGFR (African American): >60
EGFR (Non-African Amer.): >60
Glucose: 244 mg/dL — ABNORMAL HIGH (ref 65–99)
Osmolality: 281 (ref 275–301)
Potassium: 3.8 mmol/L (ref 3.5–5.1)
Sodium: 137 mmol/L (ref 136–145)

## 2014-05-07 LAB — VANCOMYCIN, TROUGH: Vancomycin, Trough: 15 ug/mL (ref 10–20)

## 2014-05-09 LAB — CREATININE, SERUM
Creatinine: 0.87 mg/dL (ref 0.60–1.30)
EGFR (African American): >60
EGFR (Non-African Amer.): >60

## 2014-05-10 LAB — CBC WITH DIFFERENTIAL/PLATELET
Basophil #: 0 10*3/uL (ref 0.0–0.1)
Basophil %: 0.4 %
Eosinophil #: 0 10*3/uL (ref 0.0–0.7)
Eosinophil %: 0.1 %
HCT: 38.6 % — ABNORMAL LOW (ref 40.0–52.0)
HGB: 12.3 g/dL — ABNORMAL LOW (ref 13.0–18.0)
Lymphocyte #: 0.2 10*3/uL — ABNORMAL LOW (ref 1.0–3.6)
Lymphocyte %: 2.1 %
MCH: 28.9 pg (ref 26.0–34.0)
MCHC: 32 g/dL (ref 32.0–36.0)
MCV: 90 fL (ref 80–100)
Monocyte #: 0.3 x10 3/mm (ref 0.2–1.0)
Monocyte %: 3.8 %
Neutrophil #: 8.1 10*3/uL — ABNORMAL HIGH (ref 1.4–6.5)
Neutrophil %: 93.6 %
Platelet: 276 10*3/uL (ref 150–440)
RBC: 4.27 10*6/uL — ABNORMAL LOW (ref 4.40–5.90)
RDW: 13.3 % (ref 11.5–14.5)
WBC: 8.6 10*3/uL (ref 3.8–10.6)

## 2014-05-10 LAB — BASIC METABOLIC PANEL
Anion Gap: 3 — ABNORMAL LOW (ref 7–16)
BUN: 16 mg/dL (ref 7–18)
Calcium, Total: 8.3 mg/dL — ABNORMAL LOW (ref 8.5–10.1)
Chloride: 101 mmol/L (ref 98–107)
Co2: 33 mmol/L — ABNORMAL HIGH (ref 21–32)
Creatinine: 1.01 mg/dL (ref 0.60–1.30)
EGFR (African American): >60
EGFR (Non-African Amer.): >60
Glucose: 262 mg/dL — ABNORMAL HIGH (ref 65–99)
Osmolality: 284 (ref 275–301)
Potassium: 4.2 mmol/L (ref 3.5–5.1)
Sodium: 137 mmol/L (ref 136–145)

## 2014-05-10 LAB — CULTURE, BLOOD (SINGLE)

## 2014-05-11 ENCOUNTER — Encounter (HOSPITAL_COMMUNITY): Payer: Self-pay | Admitting: Pulmonary Disease

## 2014-05-11 ENCOUNTER — Inpatient Hospital Stay (HOSPITAL_COMMUNITY)
Admission: AD | Admit: 2014-05-11 | Discharge: 2014-05-15 | DRG: 091 | Disposition: A | Discharge status: Home Health | Payer: 59 | Source: Other Acute Inpatient Hospital | Attending: Internal Medicine | Admitting: Internal Medicine

## 2014-05-11 DIAGNOSIS — Z8673 Personal history of transient ischemic attack (TIA), and cerebral infarction without residual deficits: Secondary | ICD-10-CM | POA: Diagnosis not present

## 2014-05-11 DIAGNOSIS — I739 Peripheral vascular disease, unspecified: Secondary | ICD-10-CM | POA: Diagnosis present

## 2014-05-11 DIAGNOSIS — G894 Chronic pain syndrome: Secondary | ICD-10-CM | POA: Diagnosis present

## 2014-05-11 DIAGNOSIS — F3289 Other specified depressive episodes: Secondary | ICD-10-CM | POA: Diagnosis present

## 2014-05-11 DIAGNOSIS — E785 Hyperlipidemia, unspecified: Secondary | ICD-10-CM | POA: Diagnosis present

## 2014-05-11 DIAGNOSIS — J441 Chronic obstructive pulmonary disease with (acute) exacerbation: Secondary | ICD-10-CM | POA: Diagnosis present

## 2014-05-11 DIAGNOSIS — Y838 Other surgical procedures as the cause of abnormal reaction of the patient, or of later complication, without mention of misadventure at the time of the procedure: Secondary | ICD-10-CM | POA: Diagnosis present

## 2014-05-11 DIAGNOSIS — K219 Gastro-esophageal reflux disease without esophagitis: Secondary | ICD-10-CM | POA: Diagnosis present

## 2014-05-11 DIAGNOSIS — T8509XA Other mechanical complication of ventricular intracranial (communicating) shunt, initial encounter: Secondary | ICD-10-CM

## 2014-05-11 DIAGNOSIS — F411 Generalized anxiety disorder: Secondary | ICD-10-CM | POA: Diagnosis present

## 2014-05-11 DIAGNOSIS — M810 Age-related osteoporosis without current pathological fracture: Secondary | ICD-10-CM | POA: Diagnosis present

## 2014-05-11 DIAGNOSIS — Z79899 Other long term (current) drug therapy: Secondary | ICD-10-CM | POA: Diagnosis not present

## 2014-05-11 DIAGNOSIS — J189 Pneumonia, unspecified organism: Secondary | ICD-10-CM | POA: Diagnosis present

## 2014-05-11 DIAGNOSIS — J8410 Pulmonary fibrosis, unspecified: Secondary | ICD-10-CM | POA: Diagnosis present

## 2014-05-11 DIAGNOSIS — T85695A Other mechanical complication of other nervous system device, implant or graft, initial encounter: Principal | ICD-10-CM | POA: Diagnosis present

## 2014-05-11 DIAGNOSIS — G9389 Other specified disorders of brain: Secondary | ICD-10-CM | POA: Diagnosis present

## 2014-05-11 DIAGNOSIS — R4182 Altered mental status, unspecified: Secondary | ICD-10-CM | POA: Diagnosis present

## 2014-05-11 DIAGNOSIS — F32A Depression, unspecified: Secondary | ICD-10-CM | POA: Diagnosis present

## 2014-05-11 DIAGNOSIS — T8509XS Other mechanical complication of ventricular intracranial (communicating) shunt, sequela: Secondary | ICD-10-CM

## 2014-05-11 DIAGNOSIS — E890 Postprocedural hypothyroidism: Secondary | ICD-10-CM | POA: Diagnosis present

## 2014-05-11 DIAGNOSIS — Z23 Encounter for immunization: Secondary | ICD-10-CM

## 2014-05-11 DIAGNOSIS — J4489 Other specified chronic obstructive pulmonary disease: Secondary | ICD-10-CM | POA: Diagnosis present

## 2014-05-11 DIAGNOSIS — J449 Chronic obstructive pulmonary disease, unspecified: Secondary | ICD-10-CM | POA: Diagnosis present

## 2014-05-11 DIAGNOSIS — T889XXS Complication of surgical and medical care, unspecified, sequela: Secondary | ICD-10-CM

## 2014-05-11 DIAGNOSIS — G9340 Encephalopathy, unspecified: Secondary | ICD-10-CM

## 2014-05-11 DIAGNOSIS — Z9981 Dependence on supplemental oxygen: Secondary | ICD-10-CM | POA: Diagnosis not present

## 2014-05-11 DIAGNOSIS — F17200 Nicotine dependence, unspecified, uncomplicated: Secondary | ICD-10-CM | POA: Diagnosis present

## 2014-05-11 DIAGNOSIS — J438 Other emphysema: Secondary | ICD-10-CM

## 2014-05-11 DIAGNOSIS — F329 Major depressive disorder, single episode, unspecified: Secondary | ICD-10-CM | POA: Diagnosis present

## 2014-05-11 HISTORY — DX: Shortness of breath: R06.02

## 2014-05-11 HISTORY — DX: Thyrotoxicosis, unspecified without thyrotoxic crisis or storm: E05.90

## 2014-05-11 HISTORY — DX: Chronic obstructive pulmonary disease, unspecified: J44.9

## 2014-05-11 HISTORY — DX: Other specified interstitial pulmonary diseases: J84.89

## 2014-05-11 HISTORY — DX: Cerebral infarction, unspecified: I63.9

## 2014-05-11 LAB — CBC
HCT: 37.6 % — ABNORMAL LOW (ref 39.0–52.0)
Hemoglobin: 12.8 g/dL — ABNORMAL LOW (ref 13.0–17.0)
MCH: 29.4 pg (ref 26.0–34.0)
MCHC: 34 g/dL (ref 30.0–36.0)
MCV: 86.4 fL (ref 78.0–100.0)
Platelets: 241 10*3/uL (ref 150–400)
RBC: 4.35 MIL/uL (ref 4.22–5.81)
RDW: 12.8 % (ref 11.5–15.5)
WBC: 7.2 10*3/uL (ref 4.0–10.5)

## 2014-05-11 LAB — BASIC METABOLIC PANEL
Anion Gap: 7 (ref 7–16)
BUN: 21 mg/dL — ABNORMAL HIGH (ref 7–18)
Calcium, Total: 8.8 mg/dL (ref 8.5–10.1)
Chloride: 95 mmol/L — ABNORMAL LOW (ref 98–107)
Co2: 32 mmol/L (ref 21–32)
Creatinine: 1.13 mg/dL (ref 0.60–1.30)
EGFR (African American): >60
EGFR (Non-African Amer.): >60
Glucose: 295 mg/dL — ABNORMAL HIGH (ref 65–99)
Osmolality: 282 (ref 275–301)
Potassium: 4.5 mmol/L (ref 3.5–5.1)
Sodium: 134 mmol/L — ABNORMAL LOW (ref 136–145)

## 2014-05-11 LAB — CREATININE, SERUM
Creatinine, Ser: 0.86 mg/dL (ref 0.50–1.35)
GFR calc Af Amer: >90 mL/min (ref >90)
GFR calc non Af Amer: >90 mL/min (ref >90)

## 2014-05-11 LAB — TSH: Thyroid Stimulating Horm: 0.072 u[IU]/mL — ABNORMAL LOW

## 2014-05-11 LAB — GLUCOSE, CAPILLARY: Glucose-Capillary: 232 mg/dL — ABNORMAL HIGH (ref 70–99)

## 2014-05-11 LAB — MRSA PCR SCREENING: MRSA by PCR: NEGATIVE

## 2014-05-11 MED ORDER — OXYCODONE HCL 5 MG PO TABS
5.0000 mg | ORAL_TABLET | Freq: Four times a day (QID) | ORAL | Status: DC
Start: 2014-05-11 — End: 2014-05-15
  Administered 2014-05-11 – 2014-05-15 (×7): 5 mg via ORAL
  Filled 2014-05-11 (×8): qty 1

## 2014-05-11 MED ORDER — METHYLPREDNISOLONE SODIUM SUCC 40 MG IJ SOLR
40.0000 mg | Freq: Every day | INTRAMUSCULAR | Status: DC
  Administered 2014-05-11 – 2014-05-15 (×5): 40 mg via INTRAVENOUS
  Filled 2014-05-11 (×7): qty 1

## 2014-05-11 MED ORDER — ALBUTEROL SULFATE (2.5 MG/3ML) 0.083% IN NEBU: 2.5000 mg | INHALATION_SOLUTION | RESPIRATORY_TRACT | Status: DC | PRN

## 2014-05-11 MED ORDER — THYROID 30 MG PO TABS
90.0000 mg | ORAL_TABLET | Freq: Every day | ORAL | Status: DC
  Administered 2014-05-11 – 2014-05-15 (×4): 90 mg via ORAL
  Filled 2014-05-11 (×5): qty 1

## 2014-05-11 MED ORDER — FENTANYL CITRATE 0.05 MG/ML IJ SOLN
25.0000 ug | INTRAMUSCULAR | Status: DC | PRN
  Administered 2014-05-13 – 2014-05-15 (×9): 25 ug via INTRAVENOUS
  Filled 2014-05-11 (×10): qty 2

## 2014-05-11 MED ORDER — IPRATROPIUM-ALBUTEROL 0.5-2.5 (3) MG/3ML IN SOLN
3.0000 mL | Freq: Three times a day (TID) | RESPIRATORY_TRACT | Status: DC
  Administered 2014-05-12 – 2014-05-15 (×10): 3 mL via RESPIRATORY_TRACT
  Filled 2014-05-11 (×10): qty 3

## 2014-05-11 MED ORDER — INSULIN ASPART 100 UNIT/ML ~~LOC~~ SOLN
2.0000 [IU] | SUBCUTANEOUS | Status: DC
  Administered 2014-05-11 – 2014-05-12 (×2): 6 [IU] via SUBCUTANEOUS

## 2014-05-11 MED ORDER — IPRATROPIUM-ALBUTEROL 0.5-2.5 (3) MG/3ML IN SOLN
3.0000 mL | RESPIRATORY_TRACT | Status: DC
  Administered 2014-05-11: 3 mL via RESPIRATORY_TRACT
  Filled 2014-05-11: qty 3

## 2014-05-11 MED ORDER — HEPARIN SODIUM (PORCINE) 5000 UNIT/ML IJ SOLN
5000.0000 [IU] | Freq: Three times a day (TID) | INTRAMUSCULAR | Status: DC
  Administered 2014-05-11 – 2014-05-12 (×3): 5000 [IU] via SUBCUTANEOUS
  Filled 2014-05-11 (×8): qty 1

## 2014-05-11 MED ORDER — THYROID 120 MG PO TABS: 120.0000 mg | ORAL_TABLET | Freq: Every day | ORAL | Status: DC

## 2014-05-11 MED ORDER — DEXTROSE-NACL 5-0.45 % IV SOLN
INTRAVENOUS | Status: DC
  Administered 2014-05-11: 19:00:00 via INTRAVENOUS

## 2014-05-11 MED ORDER — CETYLPYRIDINIUM CHLORIDE 0.05 % MT LIQD
7.0000 mL | Freq: Two times a day (BID) | OROMUCOSAL | Status: DC
  Administered 2014-05-12 – 2014-05-14 (×6): 7 mL via OROMUCOSAL

## 2014-05-11 MED ORDER — SODIUM CHLORIDE 0.9 % IV SOLN: 250.0000 mL | INTRAVENOUS | Status: DC | PRN

## 2014-05-11 MED ORDER — DEXTROSE 5 % IV SOLN
1.0000 g | INTRAVENOUS | Status: AC
  Administered 2014-05-11 – 2014-05-13 (×3): 1 g via INTRAVENOUS
  Filled 2014-05-11 (×3): qty 10

## 2014-05-11 MED ORDER — LEVETIRACETAM 500 MG PO TABS
500.0000 mg | ORAL_TABLET | Freq: Two times a day (BID) | ORAL | Status: DC
  Administered 2014-05-11: 500 mg via ORAL
  Filled 2014-05-11 (×3): qty 1

## 2014-05-11 MED ORDER — AZATHIOPRINE 50 MG PO TABS
50.0000 mg | ORAL_TABLET | Freq: Every day | ORAL | Status: DC
  Filled 2014-05-11: qty 1

## 2014-05-11 NOTE — H&P (Signed)
Name: Kirk Robinson MRN: [id number] DOB: 10/16/1955    ADMISSION DATE:  05/11/2014  REFERRING MD :  [name]  PRIMARY SERVICE:  PCCM   CHIEF COMPLAINT:  AMS  BRIEF PATIENT DESCRIPTION: 58 y/o M with prior VP shunt placement initially admitted to [facility] on 9/15 for URI symptoms & hypoxia.  Rx'd as PNA with improvement but subsequently developed AMS and CT of head was obtained with concerns for malfunctioning VP shunt with ventriculomegaly.  Tx to [facility] for further evaluation.   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 Raul Del,   SIGNIFICANT EVENTS / STUDIES:  9/15  Admit to [facility] with URI, Rx'd as PNA.  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 Vella Kohler,  CVA (remote) & VP shunt placement initially admitted to [facility] on 9/15 for URI symptoms & hypoxia.  He reportedly had symptoms for one week prior to presentation - nasal congestion, sore throat and positive sick contacts.  He saw Dr. Vella Kohler and was treated with steroids and amoxicillin.  Despite outpatient treatment, he had worsening shortness of breath and confusion.  Initial work up noted fever of 101 and hypoxemia despite O2.  He was rx'd as PNA with vanco/zosyn and had improvement but subsequently developed AMS and CT of head was obtained which demonstrated concerns for malfunctioning VP shunt with ventriculomegaly.  Patient was transferred to [facility] ICU for further evaluation.    PAST MEDICAL HISTORY :  Past Medical History  Diagnosis Date  . Altered mental status   . Pneumonia, organism unspecified   . Memory loss   . Osteoporosis, unspecified     bone density per Morivati in 2012  . Iodine  hypothyroidism   . Anxiety state, unspecified   . Pure hypercholesterolemia   . Hematuria 03/16/2007  . Allergic rhinitis, cause unspecified   . Alcohol use 03/16/2007    patient risk factors  . Esophageal reflux   . Tobacco use disorder 03/16/2007    patient risk factors  . 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 Cervicalgia 09/17/2007  . Diplopia 12/21/2007  . Other abnormal glucose 04/08/2008  . Personal history of colonic polyps   . Other diseases of lung, not elsewhere classified 01/23/2009    s/p Fleming/pulmonology consult; intolerant  to Advair  . Gastritis 05/13/2009  . Peripheral vascular disease, unspecified 12/24/2009  . Unspecified hypothyroidism 01/07/2010  . NSIP (nonspecific interstitial pneumonia)     on azathioprine   Past Surgical History  Procedure Laterality Date  . Eye surgery  2010  . 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 Admission 11/2011      altered mental status/confusion with syncope.  CT head negative, MRI brain negative, EEG: diffuse slowing but no  seizure acitivity.  Labs negative.  [facility].  Marland Kitchen Neuropsychiatric evaluation  04/2012    poor short-term memory, poor recall, delayed reaction time; permanently disabled.     Prior to Admission medications   Medication Sig Start Date End Date Taking? Authorizing Provider  atorvastatin (LIPITOR) 20 MG tablet TAKE 1 TABLET (20 MG TOTAL) BY MOUTH DAILY. 07/14/13   Renette Butters  Tamala Julian, MD  atorvastatin (LIPITOR) 20 MG tablet Take 1 tablet (20 mg total) by mouth daily. PATIENT NEEDS OFFICE VISIT FOR ADDITIONAL REFILLS 02/26/14   [name], MD  LORazepam (ATIVAN) 0.5 MG tablet TAKE 1 TABLET BY MOUTH AT BEDTIME AS NEEDED FOR ANXIETY 07/14/13   [name], MD  Morphine Sulfate, Concentrate, 100 MG/5ML SOLN Take 100 mg by mouth 3 (three) times daily.    Historical Provider, MD  omeprazole (PRILOSEC) 20 MG capsule Take 1 capsule (20 mg total) by mouth daily. 02/26/14   [name], MD  predniSONE (STERAPRED UNI-PAK) 5 MG TABS Take 5 mg by mouth every other day.    Historical Provider, MD  sertraline (ZOLOFT) 100 MG tablet Take 1 tablet (100 mg total) by mouth daily. PATIENT NEEDS OFFICE VISIT FOR ADDITIONAL REFILLS 07/06/13   [name], MD  sertraline (ZOLOFT) 100 MG tablet Take 1.5 tablets (150 mg total) by mouth daily. 02/26/14   [name], MD  tapentadol (NUCYNTA) 50 MG TABS Take 75 mg by mouth daily.    Historical Provider, MD  thyroid (ARMOUR) 120 MG tablet Take 120 mg by mouth daily.    Historical Provider, MD  tiotropium (SPIRIVA) 18 MCG inhalation capsule Place 18 mcg into inhaler and inhale daily.    Historical Provider, MD  Vitamin D, Ergocalciferol, (DRISDOL) 50000 UNITS CAPS Take 50,000 Units by mouth daily.    Historical Provider, MD   Allergies  Allergen Reactions  . Erythromycin   . Advair Diskus [Fluticasone-Salmeterol]     Short of breath  . Darvocet [Propoxyphene N-Acetaminophen]   . Doxycycline Hyclate Swelling    Tongue swelling, upset stomach  . Septra [Sulfamethoxazole-Trimethoprim]     FAMILY HISTORY:  Family History  Problem Relation Age of Onset  . Heart disease Father     cad, chf  . Cancer Father   . Heart disease Brother     open heart surgery  . Diabetes Brother   . Cancer Brother   . Heart disease Mother     cad  . Diabetes Mother   . Stroke Mother    SOCIAL HISTORY:  reports that he has been smoking.  He does not have any smokeless tobacco history on file. He reports that he does not drink alcohol or use illicit drugs.  REVIEW OF SYSTEMS:   Constitutional: Negative for chills, weight loss, malaise/fatigue and diaphoresis. SEE HPI.  HENT: Negative for hearing loss, ear pain, nosebleeds, congestion, sore throat, neck pain, tinnitus and ear discharge.   Eyes: Negative for blurred vision, double vision, photophobia, pain, discharge and redness.  Respiratory: Negative for hemoptysis, sputum production, wheezing  and stridor.  SEE HPI.  Cardiovascular: Negative for chest pain, palpitations, orthopnea, claudication, leg swelling and PND.  Gastrointestinal: Negative for heartburn, nausea, vomiting, abdominal pain, diarrhea, constipation, blood in stool and melena.  Genitourinary: Negative for dysuria, urgency, frequency, hematuria and flank pain.  Musculoskeletal: Negative for myalgias, back pain, joint pain and falls.  Skin: Negative for itching and rash.  Neurological: Negative for dizziness, tingling, tremors, sensory change, speech change, focal weakness, seizures, loss of consciousness, weakness and headaches.  Endo/Heme/Allergies: Negative for environmental allergies and polydipsia. Does not bruise/bleed easily.  SUBJECTIVE:   VITAL SIGNS:    PHYSICAL EXAMINATION: General:  Chronically ill in NAD Neuro:  Awake, oriented to self, MAE, L pupil 20mm, R 66mm (apparently chronic), follows commands, non focal HEENT:  Mm pink/moist, no jvd Cardiovascular:  s1s2 rrr, no m/r/g  Lungs:  resp's even/non-labored, lungs bilaterally coarse Abdomen:  Round/soft, bsx4 active  Musculoskeletal:  No acute deformities  Skin:  Warm/dry, no edema   No results found for this basename: NA, K, CL, CO2, BUN, CREATININE, GLUCOSE,  in the last 168 hours No results found for this basename: HGB, HCT, WBC, PLT,  in the last 168 hours No results found.  ASSESSMENT / PLAN:  Malfunctioning VP Shunt - ventriculomegaly noted on CT Head at [facility] Hx CVA s/p Shunt (remote)  ? Seizures - noted twitching activity on admit  Plan: NSGY evaluation, appreciate input Monitor mental status, serial neuro exams  Will likely need new shunt placement d/w Dr Joya Salm  Keppra 500 mg BID initiated on admit  CAP - dx on admission at [facility]  O2 Dependent COPD - 3L baseline NSIP - on azathioprine, followed by Dr. Vella Kohler, reportedly biopsy confirmed  Plan: Scheduled duoneb PRN albuterol  Continue solumedrol with rapid taper to off Hold  home spiriva  Continue azathioprine  Narrow abx to rocephin only.  D5/x abx  D5 1/2 NS at 50 ml /hr   Chronic Pain  Anxiety  Depression   Plan: Hold home nucynta 75mg , sertraline, Morphine ER (100 mg Q8 hours), Diazepam (5 mg Q8 PRN)  Oxy IR 5mg  Q6  Fentanyl 25 mcg Q2 PRN  Monitor closely for withdrawal given high dose home regimen  GERD At Risk Aspiration - in setting of mental status change   Plan: Continue PPI Aspiration precautions Assess swallowing, if tolerates sips, ok to advance diet as tolerated.  If fails, NPO and SLP evaluation.   HLD  Plan: Hold home Lipitor  Hypothyroidism   Plan: Continue armour thyroid  Today's Summary:  58 y/o M admitted to [facility] with CAP, developed AMS and found to have a malfunctioning VP shunt.  Pt was transferred to [facility] for further evaluation of shunt.   Noe Gens, NP-C [facility] Pgr: [phone] or [phone]   Attending note - Independently examined pt, evaluated data & formulated above care plan with NP . D/w Dr Joya Salm - he will need revision of VP shunt, Keppra added for seizure prophylaxis. Unequal pupils are chronic per wife. Concern for opiate & benzo withdrawal - will use fentanyl IV until clears swallow, then can restart meds in smaller doses.  Care during the described time interval was provided by me and/or other providers on the critical care team.  I have reviewed this patient's available data, including medical history, events of note, physical examination and test results as part of my evaluation  CC time x 50 m  Rigoberto Noel. MD 05/11/2014, 5:48 PM

## 2014-05-12 ENCOUNTER — Inpatient Hospital Stay (HOSPITAL_COMMUNITY): Payer: 59

## 2014-05-12 DIAGNOSIS — J438 Other emphysema: Secondary | ICD-10-CM

## 2014-05-12 LAB — BASIC METABOLIC PANEL
Anion gap: 10 (ref 5–15)
BUN: 18 mg/dL (ref 6–23)
CO2: 33 mEq/L — ABNORMAL HIGH (ref 19–32)
Calcium: 8.9 mg/dL (ref 8.4–10.5)
Chloride: 97 mEq/L (ref 96–112)
Creatinine, Ser: 0.72 mg/dL (ref 0.50–1.35)
GFR calc Af Amer: >90 mL/min (ref >90)
GFR calc non Af Amer: >90 mL/min (ref >90)
Glucose, Bld: 140 mg/dL — ABNORMAL HIGH (ref 70–99)
Potassium: 4.2 mEq/L (ref 3.7–5.3)
Sodium: 140 mEq/L (ref 137–147)

## 2014-05-12 LAB — CSF CELL COUNT WITH DIFFERENTIAL
RBC Count, CSF: 0 /mm3
WBC, CSF: 0 /mm3 (ref 0–5)

## 2014-05-12 LAB — CBC
HCT: 38.5 % — ABNORMAL LOW (ref 39.0–52.0)
Hemoglobin: 13.1 g/dL (ref 13.0–17.0)
MCH: 29.2 pg (ref 26.0–34.0)
MCHC: 34 g/dL (ref 30.0–36.0)
MCV: 85.9 fL (ref 78.0–100.0)
Platelets: 261 10*3/uL (ref 150–400)
RBC: 4.48 MIL/uL (ref 4.22–5.81)
RDW: 12.7 % (ref 11.5–15.5)
WBC: 8.4 10*3/uL (ref 4.0–10.5)

## 2014-05-12 LAB — GLUCOSE, CAPILLARY
Glucose-Capillary: 105 mg/dL — ABNORMAL HIGH (ref 70–99)
Glucose-Capillary: 108 mg/dL — ABNORMAL HIGH (ref 70–99)
Glucose-Capillary: 111 mg/dL — ABNORMAL HIGH (ref 70–99)
Glucose-Capillary: 121 mg/dL — ABNORMAL HIGH (ref 70–99)
Glucose-Capillary: 186 mg/dL — ABNORMAL HIGH (ref 70–99)
Glucose-Capillary: 222 mg/dL — ABNORMAL HIGH (ref 70–99)
Glucose-Capillary: 84 mg/dL (ref 70–99)

## 2014-05-12 LAB — GRAM STAIN: Gram Stain: NONE SEEN

## 2014-05-12 LAB — PROTEIN, CSF: Total  Protein, CSF: 6 mg/dL — ABNORMAL LOW (ref 15–45)

## 2014-05-12 LAB — GLUCOSE, CSF: Glucose, CSF: 70 mg/dL (ref 43–76)

## 2014-05-12 MED ORDER — INSULIN ASPART 100 UNIT/ML ~~LOC~~ SOLN
0.0000 [IU] | SUBCUTANEOUS | Status: DC
  Administered 2014-05-12: 3 [IU] via SUBCUTANEOUS
  Administered 2014-05-12 – 2014-05-14 (×3): 2 [IU] via SUBCUTANEOUS
  Administered 2014-05-15: 5 [IU] via SUBCUTANEOUS
  Administered 2014-05-15: 2 [IU] via SUBCUTANEOUS

## 2014-05-12 NOTE — Progress Notes (Signed)
SLP Cancellation Note  Patient Details Name: Kirk Robinson MRN: [id number] DOB: September 29, 1955   Cancelled treatment:       Reason Eval/Treat Not Completed: Other (comment). Per MD, no swallow evaluation needed at this time. MD discontinuing orders.  [place], CCC-SLP [phone]    Margrett Kalb Meryl 05/12/2014, 9:07 AM

## 2014-05-12 NOTE — Progress Notes (Signed)
UR completed.  Othal Kubitz, RN BSN M[facility] CCM Trauma/Neuro ICU Case Manager [phone]  

## 2014-05-12 NOTE — Progress Notes (Signed)
Name: Kirk Robinson MRN: [id number] DOB: 05/08/1956    ADMISSION DATE:  05/11/2014  REFERRING MD :  [name]  PRIMARY SERVICE:  PCCM   CHIEF COMPLAINT:  AMS  BRIEF PATIENT DESCRIPTION: 58 y/o M with prior VP shunt placement initially admitted to [facility] on 9/15 for URI symptoms & hypoxia.  Rx'd as PNA with improvement but subsequently developed AMS and CT of head was obtained with concerns for malfunctioning VP shunt with ventriculomegaly.  Tx to [facility] for further evaluation.   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 Raul Del,   SIGNIFICANT EVENTS / STUDIES:  9/15  Admit to [facility] with URI, Rx'd as PNA.  Developed AMS, [place] with ventriculomegaly   LINES / TUBES: PIV  CULTURES: None  ANTIBIOTICS: Vanco 9/15 >> Zosyn 9/15 >>   SUBJECTIVE: No events overnight  VITAL SIGNS: Temp:  [98.2 F (36.8 C)-99.3 F (37.4 C)] 98.2 F (36.8 C) (09/21 0800) Pulse Rate:  [60-88] 73 (09/21 0800) Resp:  [10-25] 12 (09/21 0800) BP: (94-141)/(64-95) 123/84 mmHg (09/21 0800) SpO2:  [92 %-98 %] 93 % (09/21 0800) Weight:  [78.1 kg (172 lb 2.9 oz)-80.9 kg (178 lb 5.6 oz)] 78.1 kg (172 lb 2.9 oz) (09/21 0400)  PHYSICAL EXAMINATION: General:  Chronically ill in NAD Neuro:  Awake, oriented to self, MAE, L pupil 25mm, R 66mm (apparently chronic), follows some commands, non focal. HEENT:  Mm pink/moist, no jvd Cardiovascular:  s1s2 rrr, no m/r/g Lungs:  resp's even/non-labored, lungs bilaterally coarse Abdomen:  Round/soft, bsx4 active  Musculoskeletal:  No acute deformities  Skin:  Warm/dry, no edema    Recent Labs Lab 05/11/14 1908 05/12/14 0240  NA  --  140  K  --  4.2  CL  --  97  CO2  --  33*  BUN  --  18  CREATININE 0.86 0.72  GLUCOSE  --  140*    Recent Labs Lab 05/11/14 1908 05/12/14 0240  HGB 12.8* 13.1  HCT 37.6* 38.5*  WBC 7.2 8.4  PLT 241 261   Dg Chest Port 1 View  05/12/2014   CLINICAL  DATA:  Upper respiratory tract infection and hypoxia.  EXAM: PORTABLE CHEST - 1 VIEW  COMPARISON:  01/14/2013.  FINDINGS: Enlarged cardiac silhouette with an interval increase in size. Interval airspace opacity throughout both lungs. No pleural fluid seen. Mild scoliosis.  IMPRESSION: Interval cardiomegaly and diffuse bilateral alveolar edema, pneumonia or ARDS.   Electronically Signed   By: Enrique Sack M.D.   On: 05/12/2014 07:40    ASSESSMENT / PLAN:  Malfunctioning VP Shunt - ventriculomegaly noted on CT Head at [facility] Hx CVA s/p Shunt (remote)  ? Seizures - noted twitching activity on admit  Plan: NSGY evaluation, appreciate input, ?insertion of new shunt today Monitor mental status. Keppra 500 mg BID initiated on admit  CAP - dx on admission at [facility]  O2 Dependent COPD - 3L baseline NSIP - on azathioprine, followed by Dr. Vella Kohler, reportedly biopsy confirmed  Plan: Scheduled duoneb PRN albuterol  Continue solumedrol with rapid taper to off after neuro status improves post shunt Hold home spiriva  Continue azathioprine  Narrow abx to rocephin only after cultures result.  D6/x abx  D5 1/2 NS at 50 ml /hr until able to take PO.  Chronic Pain  Anxiety  Depression   Plan: Hold home nucynta 75mg , sertraline, Morphine ER (100 mg Q8 hours), Diazepam (5  mg Q8 PRN)  Oxy IR 5mg  Q6  Fentanyl 25 mcg Q2 PRN  Monitor closely for withdrawal given high dose home regimen  GERD At Risk Aspiration - in setting of mental status change   Plan: Continue PPI Aspiration precautions Assess swallowing, post VP shunt placement and after some improvement in mental status  HLD  Plan: Hold home Lipitor  Hypothyroidism   Plan: Continue armour thyroid  Today's Summary:  58 y/o M admitted to [facility] with CAP, developed AMS and found to have a malfunctioning VP shunt.  Pt was transferred to [facility] for further evaluation of shunt.  ?if will be getting another shunt today, will defer to neurosurg,  hold in ICU for now, continue broad spectrum abx and monitor for now.  Care during the described time interval was provided by me and/or other providers on the critical care team.  I have reviewed this patient's available data, including medical history, events of note, physical examination and test results as part of my evaluation  CC time x 35 m  Rush Farmer, M.D. [facility] Pulmonary/Critical Care Medicine. Pager: [phone]. After hours pager: [phone].  05/12/2014, 8:35 AM

## 2014-05-12 NOTE — Progress Notes (Signed)
Patient ID: Davius Goudeau, male   DOB: 01-14-1956, 58 y.o.   MRN: [id number] BP 123/84  Pulse 73  Temp(Src) 98.2 F (36.8 C) (Axillary)  Resp 12  Ht 5\' 9"  (1.753 m)  Wt 78.1 kg (172 lb 2.9 oz)  BMI 25.41 kg/m2  SpO2 93% Lethargic, arouses slightly to voice Moves all extremities Very well known to me. Has had anisocoria for quite some time, since original insult. Shunt placed 10 years ago. Very unusual to fail this far out from placement.  I tapped the shunt today, very easy flow through [place], I will send for abdominal ultrasound to assess for psuedocyst. May possibly have a distal obstruction, since obviously the proximal catheter is working well. Have sent csf for protein, glucose, cell count, differential, gram stain and culture.  Will either need externalization, or a new shunt. Keep npo please.

## 2014-05-12 NOTE — Progress Notes (Signed)
Patient ID: Kirk Robinson, male   DOB: Jun 21, 1956, 58 y.o.   MRN: [id number] BP 122/87  Pulse 73  Temp(Src) 99 F (37.2 C) (Axillary)  Resp 19  Ht 5\' 9"  (1.753 m)  Wt 78.1 kg (172 lb 2.9 oz)  BMI 25.41 kg/m2  SpO2 94% All tests today are normal. He is more alert this evening.  On the chance that somehow the valve was changed, I reprogrammed the valve to 31mmH2O. We know the proximal catheter is working, that the shunt is in continuity, that the shunt is in the abdomen, that he has no infection. Just very unlikely for the shunt to just stop working. If no improvement will plan on tomorrow or Wednesday to revise the distal portion of the shunt.

## 2014-05-12 NOTE — Consult Note (Signed)
NAMECASSELL, VOORHIES NO.:  [id number]  MEDICAL RECORD NO.:  [id number]  LOCATION:  3M03C                        FACILITY:  [facility]  PHYSICIAN:  Leeroy Cha, M.D.   DATE OF BIRTH:  01/11/1956  DATE OF CONSULTATION:  05/11/2014 DATE OF DISCHARGE:                                CONSULTATION   The patient was transferred from [facility] after they talked to me by phone.  Mr. Baylock was admitted to [facility] about 3 days ago with a chronic COPD.  The patient was on the floor, but it was found that he was lethargic.  This gentleman many years ago has had VP shunt.  The CT scan showed that he has a slight increase of the ventricle.  He was transferred to the [facility] neuro ICU.  I was called by Dr. Elsworth Soho after he evaluated the patient.  The patient is awake, is sleeping.  He is following commands, but he is unable to tell me the date of birth or where he is.  He is able to answer yes and no.  PHYSICAL EXAMINATION:  He has a shunt in the left side which is working. The tip of the sunt__________ is 4 to 5 and the right one is about 2.  According to the family, this is a chronic situation.  There is no other cranial nerve involved.  He is able to move all the 4 extremities and sensation is normal.  The CT scan showed that indeed, although he has a shunt in place, nevertheless there is a increase of the ventricles including the third and the temporal ventricles.  RECOMMENDATION:  I compared the CT scan from the previous 1 about 9 years ago and although the shunt do work nevertheless, I do not think this might be obstruction along the pathway.  I will be talking to the family tomorrow to see if they agree with insertion of a new shunt in the right side and let the old one alone.          ______________________________ Leeroy Cha, M.D.     EB/MEDQ  D:  05/11/2014  T:  05/11/2014  Job:  [id number]

## 2014-05-13 ENCOUNTER — Encounter (HOSPITAL_COMMUNITY): Payer: 59 | Admitting: Anesthesiology

## 2014-05-13 ENCOUNTER — Inpatient Hospital Stay (HOSPITAL_COMMUNITY): Payer: 59 | Admitting: Anesthesiology

## 2014-05-13 LAB — BASIC METABOLIC PANEL
Anion gap: 14 (ref 5–15)
BUN: 21 mg/dL (ref 6–23)
CO2: 29 mEq/L (ref 19–32)
Calcium: 9 mg/dL (ref 8.4–10.5)
Chloride: 94 mEq/L — ABNORMAL LOW (ref 96–112)
Creatinine, Ser: 0.72 mg/dL (ref 0.50–1.35)
GFR calc Af Amer: >90 mL/min (ref >90)
GFR calc non Af Amer: >90 mL/min (ref >90)
Glucose, Bld: 110 mg/dL — ABNORMAL HIGH (ref 70–99)
Potassium: 3.8 mEq/L (ref 3.7–5.3)
Sodium: 137 mEq/L (ref 137–147)

## 2014-05-13 LAB — PHOSPHORUS: Phosphorus: 3.7 mg/dL (ref 2.3–4.6)

## 2014-05-13 LAB — GLUCOSE, CAPILLARY
Glucose-Capillary: 122 mg/dL — ABNORMAL HIGH (ref 70–99)
Glucose-Capillary: 107 mg/dL — ABNORMAL HIGH (ref 70–99)
Glucose-Capillary: 126 mg/dL — ABNORMAL HIGH (ref 70–99)
Glucose-Capillary: 101 mg/dL — ABNORMAL HIGH (ref 70–99)

## 2014-05-13 LAB — CBC
HCT: 42.6 % (ref 39.0–52.0)
Hemoglobin: 14.7 g/dL (ref 13.0–17.0)
MCH: 29.3 pg (ref 26.0–34.0)
MCHC: 34.5 g/dL (ref 30.0–36.0)
MCV: 84.9 fL (ref 78.0–100.0)
Platelets: 285 10*3/uL (ref 150–400)
RBC: 5.02 MIL/uL (ref 4.22–5.81)
RDW: 12.9 % (ref 11.5–15.5)
WBC: 10 10*3/uL (ref 4.0–10.5)

## 2014-05-13 LAB — MAGNESIUM: Magnesium: 2.2 mg/dL (ref 1.5–2.5)

## 2014-05-13 MED ORDER — INFLUENZA VAC SPLIT QUAD 0.5 ML IM SUSY
0.5000 mL | PREFILLED_SYRINGE | INTRAMUSCULAR | Status: AC
  Administered 2014-05-14: 0.5 mL via INTRAMUSCULAR
  Filled 2014-05-13: qty 0.5

## 2014-05-13 MED ORDER — SODIUM CHLORIDE 0.9 % IV SOLN: 250.0000 mL | INTRAVENOUS | Status: DC | PRN

## 2014-05-13 NOTE — Progress Notes (Addendum)
Name: Kirk Robinson MRN: [id number] DOB: September 25, 1955    ADMISSION DATE:  05/11/2014  REF[facility]ING MD :  [name]  PRIMARY SERVICE:  PCCM   CHIEF COMPLAINT:  AMS  BRIEF PATIENT DESCRIPTION: 58 y/o M with prior VP shunt placement initially admitted to [facility] on 9/15 for URI symptoms & hypoxia.  Rx'd as PNA with improvement but subsequently developed AMS and CT of head was obtained with concerns for malfunctioning VP shunt with ventriculomegaly.  Tx to [facility] for further evaluation.   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 Raul Del,   SIGNIFICANT EVENTS / STUDIES:  9/15  Admit to [facility] with URI, Rx'd as PNA.  Developed AMS, CT Head with ventriculomegaly  9/21 proximal shunt functional, continuous and is in the abdomen.  LINES / TUBES: PIV  CULTURES: None  ANTIBIOTICS: Vanco 9/15 >> Zosyn 9/15 >>   SUBJECTIVE: No events overnight, more responsive this AM.  VITAL SIGNS: Temp:  [97.9 F (36.6 C)-99 F (37.2 C)] 98.2 F (36.8 C) (09/22 0351) Pulse Rate:  [65-114] 72 (09/22 0700) Resp:  [10-25] 18 (09/22 0700) BP: (112-128)/(71-99) 123/85 mmHg (09/22 0700) SpO2:  [91 %-96 %] 94 % (09/22 0700) Weight:  [76.5 kg (168 lb 10.4 oz)] 76.5 kg (168 lb 10.4 oz) (09/22 0351)  PHYSICAL EXAMINATION: General:  Chronically ill in NAD Neuro:  Awake, oriented to self, MAE, L pupil 2mm, R 74mm (apparently chronic), follows some commands, non focal. HEENT:  Mm pink/moist, no jvd Cardiovascular:  s1s2 rrr, no m/r/g Lungs:  resp's even/non-labored, lungs bilaterally coarse Abdomen:  Round/soft, bsx4 active  Musculoskeletal:  No acute deformities  Skin:  Warm/dry, no edema    Recent Labs Lab 05/11/14 1908 05/12/14 0240 05/13/14 0216  NA  --  140 137  K  --  4.2 3.8  CL  --  97 94*  CO2  --  33* 29  BUN  --  18 21  CREATININE 0.86 0.72 0.72  GLUCOSE  --  140* 110*    Recent Labs Lab 05/11/14 1908 05/12/14 0240  05/13/14 0216  HGB 12.8* 13.1 14.7  HCT 37.6* 38.5* 42.6  WBC 7.2 8.4 10.0  PLT 241 261 285   Ct Abdomen Pelvis Wo Contrast  05/12/2014   CLINICAL DATA:  Ventriculoperitoneal shunt.  EXAM: CT ABDOMEN AND PELVIS WITHOUT CONTRAST  TECHNIQUE: Multidetector CT imaging of the abdomen and pelvis was performed following the standard protocol without IV contrast.  COMPARISON:  None.  FINDINGS: Bilateral pars defects are seen at L5. Bilateral interstitial lung opacities are noted concerning for possible pulmonary edema or interstitial inflammation.  No gallstones are noted. No focal abnormality is noted in the liver or spleen. Partial fatty replacement of the pancreas is noted. Adrenal glands and kidneys appear normal. No hydronephrosis or renal obstruction is noted. Minimal atherosclerotic calcifications of abdominal aorta are noted without aneurysm formation. The appendix appears normal. There is no evidence of bowel obstruction. Ventriculoperitoneal shunt is seen entering anterior abdominal wall with tip in left pericolic region. No abnormal fluid collection is noted around the tip or anywhere else within the abdomen. Mild urinary bladder distention is noted. Minimally enlarged retroperitoneal lymph nodes are noted most likely inflammatory in origin. Inflammatory changes are noted in the mesenteric tissues which are nonspecific.  IMPRESSION: Bilateral interstitial lung opacities are noted in the visualized lung bases concerning for possible pulmonary edema or interstitial inflammation.  Ventriculoperitoneal shunt is  seen entering anterior abdominal wall with distal tip in left pericolic gutter ; no abnormal fluid collection is noted.  Mild inflammatory mesenteric stranding and mildly enlarged retroperitoneal lymph nodes are noted consistent with nonspecific inflammation. No definite abscess is noted.   Electronically Signed   By: Sabino Dick M.D.   On: 05/12/2014 13:03   Dg Skull 1-3 Views  05/12/2014    CLINICAL DATA:  Increase lethargy, VP shunt  EXAM: SKULL - 1-3 VIEW  COMPARISON:  01/13/2012 skull radiograph, MRI brain 01/12/2012  FINDINGS: Intraventricular limb of a LEFT side VP shunt extends from LEFT parietal region with tip across midline.  Radiolucent segment of shunt tubing is identified similar to previous exam.  No definite shunt discontinuity seen through the LEFT cervical region.  IMPRESSION: No definite focal shunt tubing abnormalities identified.   Electronically Signed   By: Lavonia Dana M.D.   On: 05/12/2014 10:20   Dg Chest 1 View  05/12/2014   CLINICAL DATA:  Shunt series  EXAM: CHEST - 1 VIEW  COMPARISON:  05/12/2014, 01/14/2013  FINDINGS: Ventriculoperitoneal shunt catheter with the visualized tubing intact.  Bilateral diffuse interstitial thickening. No pleural effusion or pneumothorax. Stable cardiomediastinal silhouette.  The osseous structures are unremarkable.  IMPRESSION: 1. Bilateral diffuse interstitial thickening concerning for multilobar pneumonitis versus pulmonary edema versus ARDS.   Electronically Signed   By: Kathreen Devoid   On: 05/12/2014 10:19   US Abdomen Limited  05/12/2014   CLINICAL DATA:  Possible pseudocyst around ventriculoperitoneal shunt tip.  EXAM: LIMITED ABDOMINAL ULTRASOUND  COMPARISON:  Radiograph of same day.  FINDINGS: The distal tip of the ventriculoperitoneal shunt is not well visualized on this study. No abnormal fluid collection is noted in either lower quadrant of the abdomen.  IMPRESSION: No abnormal fluid collection is noted in either lower quadrant of the abdomen.   Electronically Signed   By: Sabino Dick M.D.   On: 05/12/2014 12:28   Dg Chest Port 1 View  05/12/2014   CLINICAL DATA:  Upper respiratory tract infection and hypoxia.  EXAM: PORTABLE CHEST - 1 VIEW  COMPARISON:  01/14/2013.  FINDINGS: Enlarged cardiac silhouette with an interval increase in size. Interval airspace opacity throughout both lungs. No pleural fluid seen. Mild scoliosis.   IMPRESSION: Interval cardiomegaly and diffuse bilateral alveolar edema, pneumonia or ARDS.   Electronically Signed   By: Enrique Sack M.D.   On: 05/12/2014 07:40   Dg Abd Portable 1v  05/12/2014   CLINICAL DATA:  Shunt series  EXAM: PORTABLE ABDOMEN - 1 VIEW  COMPARISON:  None.  FINDINGS: Visualized shunt catheter tubing is intact. There is no bowel dilatation to suggest obstruction. There is no evidence of pneumoperitoneum, portal venous gas or pneumatosis. There are no pathologic calcifications along the expected course of the ureters.The osseous structures are unremarkable.  IMPRESSION: Visualize shunt catheter tubing is intact.   Electronically Signed   By: Kathreen Devoid   On: 05/12/2014 10:20    ASSESSMENT / PLAN:  Malfunctioning VP Shunt - ventriculomegaly noted on CT Head at [facility] Hx CVA s/p Shunt (remote)  ? Seizures - noted twitching activity on admit  Plan: NSGY evaluation, appreciate input, to decide on plan of care regarding the shunt today or tomorrow per notes. Monitor mental status. Keppra 500 mg BID initiated on admit  CAP - dx on admission at [facility]  O2 Dependent COPD - 3L baseline NSIP - on azathioprine, followed by Dr. Vella Kohler, reportedly biopsy confirmed  Plan: Scheduled duoneb  PRN albuterol  Continue solumedrol with rapid taper to off after neuro status improves post shunt Hold home spiriva  Continue azathioprine  Narrow abx to rocephin only after cultures result.  D7/x abx, likely d/c after 8 days pending shunt decision. D5 1/2 NS at 50 ml /hr until able to take PO.  Chronic Pain  Anxiety  Depression   Plan: Hold home nucynta 75mg , sertraline, Morphine ER (100 mg Q8 hours), Diazepam (5 mg Q8 PRN)  Oxy IR 5mg  Q6  Fentanyl 25 mcg Q2 PRN  Monitor closely for withdrawal given high dose home regimen  GERD At Risk Aspiration - in setting of mental status change   Plan: Continue PPI Aspiration precautions Assess swallowing, post VP shunt placement and after  some improvement in mental status  HLD  Plan: Hold home Lipitor  Hypothyroidism   Plan: Continue armour thyroid  Today's Summary:  58 y/o M admitted to [facility] with CAP, developed AMS and found to have a malfunctioning VP shunt.  Pt was transferred to [facility] for further evaluation of shunt.  NS to address shunt today or tomorrow, hold in ICU until decision on that is made.  Care during the described time interval was provided by me and/or other providers on the critical care team.  I have reviewed this patient's available data, including medical history, events of note, physical examination and test results as part of my evaluation  Rush Farmer, M.D. [facility] Pulmonary/Critical Care Medicine. Pager: [phone]. After hours pager: [phone].  05/13/2014, 7:11 AM

## 2014-05-14 ENCOUNTER — Encounter (HOSPITAL_COMMUNITY): Payer: Self-pay | Admitting: Anesthesiology

## 2014-05-14 ENCOUNTER — Encounter (HOSPITAL_COMMUNITY)
Admission: AD | Disposition: A | Discharge status: Home Health | Payer: Self-pay | Source: Other Acute Inpatient Hospital | Attending: Pulmonary Disease

## 2014-05-14 ENCOUNTER — Inpatient Hospital Stay (HOSPITAL_COMMUNITY): Payer: 59

## 2014-05-14 LAB — BASIC METABOLIC PANEL
Anion gap: 13 (ref 5–15)
BUN: 21 mg/dL (ref 6–23)
CO2: 26 mEq/L (ref 19–32)
Calcium: 8.4 mg/dL (ref 8.4–10.5)
Chloride: 97 mEq/L (ref 96–112)
Creatinine, Ser: 0.67 mg/dL (ref 0.50–1.35)
GFR calc Af Amer: >90 mL/min (ref >90)
GFR calc non Af Amer: >90 mL/min (ref >90)
Glucose, Bld: 88 mg/dL (ref 70–99)
Potassium: 3.7 mEq/L (ref 3.7–5.3)
Sodium: 136 mEq/L — ABNORMAL LOW (ref 137–147)

## 2014-05-14 LAB — GLUCOSE, CAPILLARY
Glucose-Capillary: 110 mg/dL — ABNORMAL HIGH (ref 70–99)
Glucose-Capillary: 89 mg/dL (ref 70–99)
Glucose-Capillary: 84 mg/dL (ref 70–99)
Glucose-Capillary: 109 mg/dL — ABNORMAL HIGH (ref 70–99)
Glucose-Capillary: 166 mg/dL — ABNORMAL HIGH (ref 70–99)
Glucose-Capillary: 127 mg/dL — ABNORMAL HIGH (ref 70–99)

## 2014-05-14 LAB — PHOSPHORUS: Phosphorus: 2.9 mg/dL (ref 2.3–4.6)

## 2014-05-14 LAB — CBC
HCT: 42.2 % (ref 39.0–52.0)
Hemoglobin: 15 g/dL (ref 13.0–17.0)
MCH: 30.2 pg (ref 26.0–34.0)
MCHC: 35.5 g/dL (ref 30.0–36.0)
MCV: 85.1 fL (ref 78.0–100.0)
Platelets: 263 10*3/uL (ref 150–400)
RBC: 4.96 MIL/uL (ref 4.22–5.81)
RDW: 12.9 % (ref 11.5–15.5)
WBC: 9.2 10*3/uL (ref 4.0–10.5)

## 2014-05-14 LAB — MAGNESIUM: Magnesium: 2.3 mg/dL (ref 1.5–2.5)

## 2014-05-14 SURGERY — CANCELLED PROCEDURE
Anesthesia: General

## 2014-05-14 MED ORDER — ONDANSETRON HCL 4 MG/2ML IJ SOLN: 4.0000 mg | Freq: Once | INTRAMUSCULAR | Status: AC | PRN

## 2014-05-14 MED ORDER — ONDANSETRON HCL 4 MG/2ML IJ SOLN
INTRAMUSCULAR | Status: AC
  Administered 2014-05-14: 4 mg via INTRAVENOUS
  Filled 2014-05-14: qty 2

## 2014-05-14 MED ORDER — TAPENTADOL HCL 50 MG PO TABS
75.0000 mg | ORAL_TABLET | ORAL | Status: DC | PRN
  Administered 2014-05-14 – 2014-05-15 (×3): 75 mg via ORAL
  Filled 2014-05-14 (×3): qty 2

## 2014-05-14 MED ORDER — CHLORHEXIDINE GLUCONATE 0.12 % MT SOLN: 15.0000 mL | Freq: Two times a day (BID) | OROMUCOSAL | Status: DC

## 2014-05-14 MED ORDER — PROPOFOL 10 MG/ML IV BOLUS
INTRAVENOUS | Status: AC
  Filled 2014-05-14: qty 20

## 2014-05-14 MED ORDER — CETYLPYRIDINIUM CHLORIDE 0.05 % MT LIQD
7.0000 mL | Freq: Two times a day (BID) | OROMUCOSAL | Status: DC
  Administered 2014-05-14: 7 mL via OROMUCOSAL

## 2014-05-14 MED ORDER — DIAZEPAM 5 MG PO TABS
5.0000 mg | ORAL_TABLET | Freq: Every day | ORAL | Status: DC | PRN
  Administered 2014-05-14: 5 mg via ORAL
  Filled 2014-05-14 (×3): qty 1

## 2014-05-14 MED ORDER — FENTANYL CITRATE 0.05 MG/ML IJ SOLN
INTRAMUSCULAR | Status: AC
  Filled 2014-05-14: qty 5

## 2014-05-14 MED ORDER — ONDANSETRON HCL 4 MG/2ML IJ SOLN
4.0000 mg | Freq: Four times a day (QID) | INTRAMUSCULAR | Status: DC | PRN
  Administered 2014-05-14: 4 mg via INTRAVENOUS

## 2014-05-14 MED ORDER — TAPENTADOL HCL 50 MG PO TABS: 75.0000 mg | ORAL_TABLET | ORAL | Status: DC | PRN

## 2014-05-14 MED ORDER — HYDROMORPHONE HCL 1 MG/ML IJ SOLN: 0.2500 mg | INTRAMUSCULAR | Status: DC | PRN

## 2014-05-14 MED ORDER — MIDAZOLAM HCL 2 MG/2ML IJ SOLN
INTRAMUSCULAR | Status: AC
  Filled 2014-05-14: qty 2

## 2014-05-14 SURGICAL SUPPLY — 87 items
BLADE SURG 10 STRL SS (BLADE) ×1 IMPLANT
BLADE SURG 11 STRL SS (BLADE) ×1 IMPLANT
BLADE SURG 15 STRL LF DISP TIS (BLADE) ×1 IMPLANT
BLADE SURG 15 STRL SS (BLADE)
BLADE SURG ROTATE 9660 (MISCELLANEOUS) ×2 IMPLANT
BOOT SUTURE AID YELLOW STND (SUTURE) IMPLANT
BRUSH SCRUB EZ 1% IODOPHOR (MISCELLANEOUS) ×1 IMPLANT
BUR ACORN 6.0 PRECISION (BURR) ×1 IMPLANT
CANISTER SUCT 3000ML (MISCELLANEOUS) IMPLANT
CLIP RANEY DISP (INSTRUMENTS) IMPLANT
CONT SPEC 4OZ CLIKSEAL STRL BL (MISCELLANEOUS) IMPLANT
CORDS BIPOLAR (ELECTRODE) ×1 IMPLANT
COVER MAYO STAND STRL (DRAPES) ×1 IMPLANT
COVER TABLE BACK 60X90 (DRAPES) ×2 IMPLANT
DECANTER SPIKE VIAL GLASS SM (MISCELLANEOUS) ×1 IMPLANT
DRAPE INCISE IOBAN 85X60 (DRAPES) IMPLANT
DRAPE ORTHO SPLIT 77X108 STRL (DRAPES)
DRAPE POUCH INSTRU U-SHP 10X18 (DRAPES) ×1 IMPLANT
DRAPE PROXIMA HALF (DRAPES) ×1 IMPLANT
DRAPE SURG ORHT 6 SPLT 77X108 (DRAPES) ×1 IMPLANT
DRSG OPSITE 4X5.5 SM (GAUZE/BANDAGES/DRESSINGS) ×2 IMPLANT
DRSG TELFA 3X8 NADH (GAUZE/BANDAGES/DRESSINGS) IMPLANT
DURAPREP 26ML APPLICATOR (WOUND CARE) ×2 IMPLANT
ELECT CAUTERY BLADE 6.4 (BLADE) ×1 IMPLANT
ELECT REM PT RETURN 9FT ADLT (ELECTROSURGICAL)
ELECTRODE REM PT RTRN 9FT ADLT (ELECTROSURGICAL) IMPLANT
GAUZE SPONGE 4X4 16PLY XRAY LF (GAUZE/BANDAGES/DRESSINGS) IMPLANT
GLOVE BIO SURGEON STRL SZ 6.5 (GLOVE) IMPLANT
GLOVE BIO SURGEON STRL SZ7 (GLOVE) IMPLANT
GLOVE BIO SURGEON STRL SZ7.5 (GLOVE) IMPLANT
GLOVE BIO SURGEON STRL SZ8 (GLOVE) IMPLANT
GLOVE BIO SURGEON STRL SZ8.5 (GLOVE) IMPLANT
GLOVE BIOGEL M 8.0 STRL (GLOVE) IMPLANT
GLOVE BIOGEL PI IND STRL 7.5 (GLOVE) IMPLANT
GLOVE BIOGEL PI INDICATOR 7.5 (GLOVE)
GLOVE ECLIPSE 6.5 STRL STRAW (GLOVE) ×1 IMPLANT
GLOVE ECLIPSE 7.0 STRL STRAW (GLOVE) IMPLANT
GLOVE ECLIPSE 7.5 STRL STRAW (GLOVE) IMPLANT
GLOVE ECLIPSE 8.0 STRL XLNG CF (GLOVE) IMPLANT
GLOVE ECLIPSE 8.5 STRL (GLOVE) IMPLANT
GLOVE EXAM NITRILE LRG STRL (GLOVE) IMPLANT
GLOVE EXAM NITRILE MD LF STRL (GLOVE) IMPLANT
GLOVE EXAM NITRILE XL STR (GLOVE) IMPLANT
GLOVE EXAM NITRILE XS STR PU (GLOVE) IMPLANT
GLOVE INDICATOR 6.5 STRL GRN (GLOVE) IMPLANT
GLOVE INDICATOR 7.0 STRL GRN (GLOVE) IMPLANT
GLOVE INDICATOR 7.5 STRL GRN (GLOVE) IMPLANT
GLOVE INDICATOR 8.0 STRL GRN (GLOVE) IMPLANT
GLOVE INDICATOR 8.5 STRL (GLOVE) IMPLANT
GLOVE OPTIFIT SS 8.0 STRL (GLOVE) IMPLANT
GLOVE SURG SS PI 6.5 STRL IVOR (GLOVE) IMPLANT
GLOVE SURG SS PI 7.0 STRL IVOR (GLOVE) IMPLANT
GOWN STRL REUS W/ TWL LRG LVL3 (GOWN DISPOSABLE) ×2 IMPLANT
GOWN STRL REUS W/ TWL XL LVL3 (GOWN DISPOSABLE) IMPLANT
GOWN STRL REUS W/TWL 2XL LVL3 (GOWN DISPOSABLE) IMPLANT
GOWN STRL REUS W/TWL LRG LVL3 (GOWN DISPOSABLE)
GOWN STRL REUS W/TWL XL LVL3 (GOWN DISPOSABLE)
HEMOSTAT SURGICEL 2X14 (HEMOSTASIS) IMPLANT
KIT BASIN OR (CUSTOM PROCEDURE TRAY) ×1 IMPLANT
KIT ROOM TURNOVER OR (KITS) ×1 IMPLANT
MARKER SKIN DUAL TIP RULER LAB (MISCELLANEOUS) IMPLANT
NDL HYPO 25X1 1.5 SAFETY (NEEDLE) ×1 IMPLANT
NEEDLE HYPO 25X1 1.5 SAFETY (NEEDLE) IMPLANT
NS IRRIG 1000ML POUR BTL (IV SOLUTION) ×1 IMPLANT
PACK EENT II TURBAN DRAPE (CUSTOM PROCEDURE TRAY) ×1 IMPLANT
PAD ARMBOARD 7.5X6 YLW CONV (MISCELLANEOUS) ×3 IMPLANT
PAD DRESSING TELFA 3X8 NADH (GAUZE/BANDAGES/DRESSINGS) ×1 IMPLANT
PATTIES SURGICAL .5 X.5 (GAUZE/BANDAGES/DRESSINGS) IMPLANT
PENCIL BUTTON HOLSTER BLD 10FT (ELECTRODE) ×1 IMPLANT
SPONGE LAP 4X18 X RAY DECT (DISPOSABLE) ×1 IMPLANT
SPONGE SURGIFOAM ABS GEL 12-7 (HEMOSTASIS) IMPLANT
STAPLER SKIN PROX WIDE 3.9 (STAPLE) ×1 IMPLANT
STRIP CLOSURE SKIN 1/4X4 (GAUZE/BANDAGES/DRESSINGS) IMPLANT
SUT BONE WAX W31G (SUTURE) ×1 IMPLANT
SUT ETHILON 3 0 PS 1 (SUTURE) ×1 IMPLANT
SUT NURALON 4 0 TR CR/8 (SUTURE) IMPLANT
SUT SILK 0 TIES 10X30 (SUTURE) ×1 IMPLANT
SUT SILK 3 0 SH 30 (SUTURE) IMPLANT
SUT VIC AB 2-0 CT2 18 VCP726D (SUTURE) ×1 IMPLANT
SUT VIC AB 3-0 SH 8-18 (SUTURE) ×1 IMPLANT
SYR BULB 3OZ (MISCELLANEOUS) ×1 IMPLANT
SYR CONTROL 10ML LL (SYRINGE) IMPLANT
TOWEL OR 17X24 6PK STRL BLUE (TOWEL DISPOSABLE) ×1 IMPLANT
TOWEL OR 17X26 10 PK STRL BLUE (TOWEL DISPOSABLE) ×1 IMPLANT
TUBE CONNECTING 12X1/4 (SUCTIONS) IMPLANT
UNDERPAD 30X30 INCONTINENT (UNDERPADS AND DIAPERS) ×1 IMPLANT
WATER STERILE IRR 1000ML POUR (IV SOLUTION) ×1 IMPLANT

## 2014-05-14 NOTE — Progress Notes (Signed)
Rehab Admissions Coordinator Note:  Patient was screened by Ailsa Mireles L for appropriateness for an Inpatient Acute Rehab Consult.  At this time, we are recommending Inpatient Rehab consult.  Vin Yonke L 05/14/2014, 4:06 PM  I can be reached at [phone].

## 2014-05-14 NOTE — Progress Notes (Signed)
Patient transferred from 3 Central to room. Family at bedside. Safety precautions and orders reviewed with patient/family. Call light within reach . Alarm activated. TELE applied per ordered , verified with [place] CCMD. Will continue to monitor.

## 2014-05-14 NOTE — Evaluation (Signed)
Physical Therapy Evaluation Patient Details Name: Kirk Robinson MRN: [id number] DOB: Sep 18, 1955 Today's Date: 05/14/2014   History of Present Illness  58 y/o M with prior VP shunt placement initially admitted to [facility] on 9/15 for URI symptoms & hypoxia.  Rx'd as PNA with improvement but subsequently developed AMS and CT of head was obtained with concerns for malfunctioning VP shunt with ventriculomegaly.  Clinical Impression  Patient demonstrates deficits in functional mobility as indicated below. Will need continued skilled Pt to address deficits and maximize function. Will see as indicated and progress as tolerated. At this time, patient requiring increased moderate assist with mobility, current recommendation for CIR pending progress.    Follow Up Recommendations CIR;Supervision/Assistance - 24 hour    Equipment Recommendations  None recommended by PT    Recommendations for Other Services Rehab consult     Precautions / Restrictions Precautions Precautions: Fall Restrictions Weight Bearing Restrictions: No      Mobility  Bed Mobility Overal bed mobility: Needs Assistance Bed Mobility: Supine to Sit     Supine to sit: Min guard     General bed mobility comments: patient rocking back and forther to power up to sitting position, min guard for safety, may benefit from cues for sidelying technique  Transfers Overall transfer level: Needs assistance Equipment used: 1 person hand held assist Transfers: Sit to/from [employer] Transfers Sit to Stand: Mod assist Stand pivot transfers: Mod assist       General transfer comment: Some instability noted with tendency to drift to the left when trying to maintain static balance. Moderate assist for stability and fall prevention.  Ambulation/Gait Ambulation/Gait assistance: Mod assist Ambulation Distance (Feet): 6 Feet         General Gait Details: pivotal steps to chair  Stairs            Wheelchair  Mobility    Modified Rankin (Stroke Patients Only)       Balance Overall balance assessment: Needs assistance Sitting-balance support: Feet supported Sitting balance-Leahy Scale: Fair (to good, able to perform EOB ther ex)     Standing balance support: No upper extremity supported;During functional activity Standing balance-Leahy Scale: Poor Standing balance comment: required moderate assist to maintain static balance                             Pertinent Vitals/Pain Pain Assessment: 0-10 Pain Score: 3  Pain Location: headache and back ache Pain Descriptors / Indicators: Aching Pain Intervention(s): Limited activity within patient's tolerance;Monitored during session;Repositioned    Home Living Family/patient expects to be discharged to:: Private residence Living Arrangements: Spouse/significant other Available Help at Discharge: Family Type of Home: House Home Access: Stairs to enter Entrance Stairs-Rails: Can reach both Entrance Stairs-Number of Steps: 4 Home Layout: One level Home Equipment: Cane - single point;Wheelchair - manual      Prior Function Level of Independence: Independent         Comments: difficult to obtain PLOF      Hand Dominance   Dominant Hand: Right    Extremity/Trunk Assessment   Upper Extremity Assessment: Generalized weakness           Lower Extremity Assessment: Generalized weakness         Communication   Communication: No difficulties  Cognition Arousal/Alertness: Awake/alert Behavior During Therapy: WFL for tasks assessed/performed Overall Cognitive Status: Impaired/Different from baseline Area of Impairment: Attention;Memory;Following commands;Safety/judgement;Awareness;Problem solving   Current Attention Level: Alternating;Divided Memory: Decreased  short-term memory Following Commands: Follows one step commands consistently Safety/Judgement: Decreased awareness of safety;Decreased awareness of  deficits Awareness: Anticipatory Problem Solving: Slow processing;Requires verbal cues;Requires tactile cues General Comments: Patient with some evident higher level cognitive deficits. Patient could not recall what AD device he used PTA. When asked with multiple choice patient said yes to every option provided without recognition of accuracy.    General Comments      Exercises        Assessment/Plan    PT Assessment Patient needs continued PT services  PT Diagnosis Difficulty walking;Abnormality of gait;Generalized weakness;Altered mental status   PT Problem List Decreased strength;Decreased range of motion;Decreased activity tolerance;Decreased balance;Decreased mobility;Decreased cognition;Pain  PT Treatment Interventions DME instruction;Gait training;Stair training;Functional mobility training;Therapeutic activities;Therapeutic exercise;Balance training;Patient/family education   PT Goals (Current goals can be found in the Care Plan section) Acute Rehab PT Goals Patient Stated Goal: to go home PT Goal Formulation: With patient Time For Goal Achievement: 05/28/14 Potential to Achieve Goals: Good    Frequency Min 3X/week   Barriers to discharge        Co-evaluation               End of Session Equipment Utilized During Treatment: Gait belt;Oxygen Activity Tolerance: Patient tolerated treatment well Patient left: in chair;with call bell/phone within reach Nurse Communication: Mobility status         Time: 4765-4650 PT Time Calculation (min): 18 min   Charges:   PT Evaluation $Initial PT Evaluation Tier I: 1 Procedure PT Treatments $Therapeutic Activity: 8-22 mins   PT G CodesDuncan Dull 05/14/2014, 2:59 PM Alben Deeds, [place] DPT  [phone]

## 2014-05-14 NOTE — Anesthesia Preprocedure Evaluation (Signed)
Anesthesia Evaluation  Patient identified by MRN, date of birth, ID band Patient awake    Reviewed: Allergy & Precautions, H&P , NPO status , Patient's Chart, lab work & pertinent test results  Airway       Dental   Pulmonary COPDCurrent Smoker,          Cardiovascular + Peripheral Vascular Disease     Neuro/Psych    GI/Hepatic GERD-  ,  Endo/Other  Hypothyroidism Hyperthyroidism   Renal/GU      Musculoskeletal  (+) Arthritis -,   Abdominal   Peds  Hematology   Anesthesia Other Findings Obstructed VP Shunt  Reproductive/Obstetrics                           Anesthesia Physical Anesthesia Plan  ASA: III  Anesthesia Plan: General   Post-op Pain Management:    Induction: Intravenous  Airway Management Planned: Oral ETT  Additional Equipment:   Intra-op Plan:   Post-operative Plan: Extubation in OR and Possible Post-op intubation/ventilation  Informed Consent: I have reviewed the patients History and Physical, chart, labs and discussed the procedure including the risks, benefits and alternatives for the proposed anesthesia with the patient or authorized representative who has indicated his/her understanding and acceptance.     Plan Discussed with: CRNA, Anesthesiologist and Surgeon  Anesthesia Plan Comments:         Anesthesia Quick Evaluation

## 2014-05-14 NOTE — Progress Notes (Signed)
Patient ID: Kirk Robinson, male   DOB: 08/28/1955, 58 y.o.   MRN: [id number] BP 103/56  Pulse 79  Temp(Src) 98.6 F (37 C) (Oral)  Resp 15  Ht 5\' 9"  (1.753 m)  Wt 76.5 kg (168 lb 10.4 oz)  BMI 24.89 kg/m2  SpO2 96% Alert and oriented to person No significant improvement with reprogramming so I will revise the shunt later today.

## 2014-05-14 NOTE — Progress Notes (Signed)
Patient ID: Kirk Robinson, male   DOB: Apr 12, 1956, 58 y.o.   MRN: [id number] BP 121/88  Pulse 74  Temp(Src) 97.6 F (36.4 C) (Oral)  Resp 17  Ht 5\' 9"  (1.753 m)  Wt 75.8 kg (167 lb 1.7 oz)  BMI 24.67 kg/m2  SpO2 97% Alert, oriented to person, place, time, situation Pupils unequal-at baseline Speech is clear, fluent Moving all extremities well Head CT showed ventricles had decreased in size since the shunt was reprogrammed. Thus there was no reason for shunt revision.

## 2014-05-14 NOTE — Evaluation (Addendum)
Clinical/Bedside Swallow Evaluation Patient Details  Name: Kirk Robinson MRN: [id number] Date of Birth: 1955/12/10  Today's Date: 05/14/2014 Time: 1205-1216 SLP Time Calculation (min): 11 min  Past Medical History:  Past Medical History  Diagnosis Date  . Altered mental status   . Pneumonia, organism unspecified   . Memory loss   . Osteoporosis, unspecified     bone density per [facility] in 2012  . Iodine hypothyroidism   . Anxiety state, unspecified   . Pure hypercholesterolemia   . Hematuria 03/16/2007  . Allergic rhinitis, cause unspecified   . Alcohol use 03/16/2007    patient risk factors  . Esophageal reflux   . Tobacco use disorder 03/16/2007    patient risk factors  . 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 Cervicalgia 09/17/2007  . Diplopia 12/21/2007  . Other abnormal glucose 04/08/2008  . Personal history of colonic polyps   . Other diseases of lung, not elsewhere classified 01/23/2009    s/p Fleming/pulmonology consult; intolerant  to Advair  . Gastritis 05/13/2009  . Peripheral vascular disease, unspecified 12/24/2009  . Unspecified hypothyroidism 01/07/2010  . NSIP (nonspecific interstitial pneumonia)     on azathioprine  . Hyperthyroidism     graves disease  . COPD (chronic obstructive pulmonary disease)   . Shortness of breath   . Stroke 2005   Past Surgical History:  Past Surgical History  Procedure Laterality Date  . Eye surgery  2010  . 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 Admission 11/2011      altered mental status/confusion with syncope.  CT head negative, MRI brain negative, EEG: diffuse slowing but no  seizure acitivity.  Labs negative.  [facility].  [facility]  04/2012    poor short-term memory, poor recall, delayed reaction time; permanently disabled.     HPI:  58 y/o M with prior VP shunt placement initially admitted to [facility] on 9/15 for URI symptoms & hypoxia. Rx'd as PNA with  improvement but subsequently developed AMS and CT of head was obtained with concerns for malfunctioning VP shunt with ventriculomegaly. Tx to [facility] for further evaluation.    Assessment / Plan / Recommendation Clinical Impression  Pt was seen wtih limited PO trials secondary to nausea at time of the assessment. Oropharyngeal swallow appeared functional with small amounts of thin liquids and regular textures. Pt would be appropriate for regular textures and thin liquids, however per discussion with RN may need to start with more restricted diet (clear liquids?) given his nausea. Would be able to advance as tolerated. Given concern for PNA upon admission and very limited observation today, will follow briefly for tolerance.  **Pt appeared to have slowed processing and decreased selective attention - will likely benefit from SLP cognitive-linguistic evaluation. MD, please order if agree.    Aspiration Risk  Mild    Diet Recommendation Regular;Thin liquid (may wish to start wtih clear liquids due to nausea)   Liquid Administration via: Cup;Straw Medication Administration: Whole meds with liquid Supervision: Patient able to self feed Compensations: Slow rate;Small sips/bites Postural Changes and/or Swallow Maneuvers: Seated upright 90 degrees;Upright 30-60 min after meal    Other  Recommendations Oral Care Recommendations: Oral care BID   Follow Up Recommendations  None;Other (comment) (none for swallow, ? need for cognition)    Frequency and Duration min 1 x/week  1 week   Pertinent Vitals/Pain n/a    SLP Swallow Goals     Swallow Study Prior  Functional Status       General Date of Onset: 05/06/14 HPI: 58 y/o M with prior VP shunt placement initially admitted to [facility] on 9/15 for URI symptoms & hypoxia. Rx'd as PNA with improvement but subsequently developed AMS and CT of head was obtained with concerns for malfunctioning VP shunt with ventriculomegaly. Tx to [facility] for further evaluation.   Type of Study: Bedside swallow evaluation Previous Swallow Assessment: none in chart Diet Prior to this Study: NPO Temperature Spikes Noted: No Respiratory Status: Nasal cannula History of Recent Intubation: No Behavior/Cognition: Alert;Cooperative;Pleasant mood;Other (comment) (delayed processing, decreased selective attention) Self-Feeding Abilities: Able to feed self Patient Positioning: Upright in chair Baseline Vocal Quality: Hoarse (mildly) Volitional Cough: Weak (reports that it is sore) Volitional Swallow: Able to elicit    Oral/Motor/Sensory Function Overall Oral Motor/Sensory Function: Appears within functional limits for tasks assessed   Ice Chips Ice chips: Not tested   Thin Liquid Thin Liquid: Within functional limits Presentation: Cup;Self Fed;Straw    Nectar Thick Nectar Thick Liquid: Not tested   Honey Thick Honey Thick Liquid: Not tested   Puree Puree: Not tested (pt orally expectorated, said he was nauseous)   Solid   GO    Solid: Within functional limits Presentation: Self Fed        Germain Osgood, M.A. CCC-SLP [phone]  Germain Osgood 05/14/2014,12:45 PM

## 2014-05-14 NOTE — Progress Notes (Signed)
Name: Kirk Robinson MRN: [id number] DOB: [birth year]    ADMISSION DATE:  05/11/2014  REFERRING MD :  [name]  PRIMARY SERVICE:  PCCM   CHIEF COMPLAINT:  AMS  BRIEF PATIENT DESCRIPTION: 58 y/o M with prior VP shunt placement initially admitted to [facility] on 9/15 for URI symptoms & hypoxia.  Rx'd as PNA with improvement but subsequently developed AMS and CT of head was obtained with concerns for malfunctioning VP shunt with ventriculomegaly.  Tx to [facility] for further evaluation.   PMH of chronic pain, CVA (remote), PVD, COPD on 3L [place] O2 at baseline, NSIP on azathioprine followed by Dr. Raul Del,   SIGNIFICANT EVENTS / STUDIES:  9/15  Admit to [facility] with URI, Rx'd as PNA.  Developed AMS, CT Head with ventriculomegaly  9/21 proximal shunt functional, continuous and is in the abdomen.  LINES / TUBES: PIV  CULTURES: None  ANTIBIOTICS: Vanco 9/15 >> Zosyn 9/15 >>   SUBJECTIVE: No events overnight, more responsive this AM.  VITAL SIGNS: Temp:  [97.6 F (36.4 C)-98.6 F (37 C)] 97.6 F (36.4 C) (09/23 0700) Pulse Rate:  [69-99] 74 (09/23 0800) Resp:  [12-21] 17 (09/23 0800) BP: (94-137)/(55-88) 121/88 mmHg (09/23 0800) SpO2:  [93 %-99 %] 97 % (09/23 0800) Weight:  [75.8 kg (167 lb 1.7 oz)] 75.8 kg (167 lb 1.7 oz) (09/23 0337)  PHYSICAL EXAMINATION: General:  Chronically ill in NAD Neuro:  Awake, oriented to self, MAE, L pupil 78mm, R 2mm (apparently chronic), follows commands, non focal. HEENT:  Mm pink/moist, no jvd Cardiovascular:  s1s2 rrr, no m/r/g Lungs:  resp's even/non-labored, lungs bilaterally coarse Abdomen:  Round/soft, bsx4 active  Musculoskeletal:  No acute deformities  Skin:  Warm/dry, no edema    Recent Labs Lab 05/12/14 0240 05/13/14 0216 05/14/14 0246  NA 140 137 136*  K 4.2 3.8 3.7  CL 97 94* 97  CO2 33* 29 26  BUN 18 21 21   CREATININE 0.72 0.72 0.67  GLUCOSE 140* 110* 88    Recent Labs Lab 05/12/14 0240 05/13/14 0216 05/14/14 0246    HGB 13.1 14.7 15.0  HCT 38.5* 42.6 42.2  WBC 8.4 10.0 9.2  PLT 261 285 263   Ct Abdomen Pelvis Wo Contrast  05/12/2014   CLINICAL DATA:  Ventriculoperitoneal shunt.  EXAM: CT ABDOMEN AND PELVIS WITHOUT CONTRAST  TECHNIQUE: Multidetector CT imaging of the abdomen and pelvis was performed following the standard protocol without IV contrast.  COMPARISON:  None.  FINDINGS: Bilateral pars defects are seen at L5. Bilateral interstitial lung opacities are noted concerning for possible pulmonary edema or interstitial inflammation.  No gallstones are noted. No focal abnormality is noted in the liver or spleen. Partial fatty replacement of the pancreas is noted. Adrenal glands and kidneys appear normal. No hydronephrosis or renal obstruction is noted. Minimal atherosclerotic calcifications of abdominal aorta are noted without aneurysm formation. The appendix appears normal. There is no evidence of bowel obstruction. Ventriculoperitoneal shunt is seen entering anterior abdominal wall with tip in left pericolic region. No abnormal fluid collection is noted around the tip or anywhere else within the abdomen. Mild urinary bladder distention is noted. Minimally enlarged retroperitoneal lymph nodes are noted most likely inflammatory in origin. Inflammatory changes are noted in the mesenteric tissues which are nonspecific.  IMPRESSION: Bilateral interstitial lung opacities are noted in the visualized lung bases concerning for possible pulmonary edema or interstitial inflammation.  Ventriculoperitoneal shunt is seen entering anterior abdominal wall with distal tip in left pericolic gutter ;  no abnormal fluid collection is noted.  Mild inflammatory mesenteric stranding and mildly enlarged retroperitoneal lymph nodes are noted consistent with nonspecific inflammation. No definite abscess is noted.   Electronically Signed   By: Sabino Dick M.D.   On: 05/12/2014 13:03   Ct Head Wo Contrast  05/14/2014   CLINICAL DATA:   Evaluate ventricles  EXAM: CT HEAD WITHOUT CONTRAST  TECHNIQUE: Contiguous axial images were obtained from the base of the skull through the vertex without intravenous contrast.  COMPARISON:  03/17/2006  FINDINGS: VP shunt via LEFT frontoparietal approach with tip at RIGHT lateral ventricle at foramen of [place]ro.  Mild atrophy.  Minimal asymmetry of the lateral ventricles LEFT larger than RIGHT, with RIGHT slightly larger than on the previous exam.  No midline shift or mass effect.  Minimal white matter hypoattenuation in frontal regions.  No intracranial hemorrhage, mass lesion or evidence acute infarction.  No extra-axial fluid collections.  Bones and sinuses unremarkable.  IMPRESSION: Minimal asymmetry of the lateral ventricles within acceptable limits.  No acute intracranial abnormalities.  When compared to the previous exam RIGHT lateral ventricle slightly larger and now closer in size to LEFT lateral ventricle.   Electronically Signed   By: Lavonia Dana M.D.   On: 05/14/2014 09:08   US Abdomen Limited  05/12/2014   CLINICAL DATA:  Possible pseudocyst around ventriculoperitoneal shunt tip.  EXAM: LIMITED ABDOMINAL ULTRASOUND  COMPARISON:  Radiograph of same day.  FINDINGS: The distal tip of the ventriculoperitoneal shunt is not well visualized on this study. No abnormal fluid collection is noted in either lower quadrant of the abdomen.  IMPRESSION: No abnormal fluid collection is noted in either lower quadrant of the abdomen.   Electronically Signed   By: Sabino Dick M.D.   On: 05/12/2014 12:28    ASSESSMENT / PLAN:  Malfunctioning VP Shunt - ventriculomegaly noted on CT Head at [facility] Hx CVA s/p Shunt (remote)  ? Seizures - noted twitching activity on admit  Plan: NSGY reports VP shunt is functional, no interventions needed. Monitor mental status. Keppra 500 mg BID initiated on admit  CAP - dx on admission at [facility]  O2 Dependent COPD - 3L baseline NSIP - on azathioprine, followed by Dr. Vella Kohler,  reportedly biopsy confirmed  Plan: Scheduled duoneb PRN albuterol  Taper solumedrol to off Hold home spiriva til discharge Continue azathioprine  Narrowd abx to rocephin only after cultures result.  D8/x abx, would d/c after today's dose. KVO IVF.  Chronic Pain  Anxiety  Depression   Plan: Hold home nucynta 75mg , sertraline, Morphine ER (100 mg Q8 hours), Diazepam (5 mg Q8 PRN)  Oxy IR 5mg  Q6  Fentanyl 25 mcg Q2 PRN   GERD At Risk Aspiration - in setting of mental status change   Plan: Continue PPI Aspiration precautions Swallow evaluation today then diet  HLD  Plan: Hold home Lipitor  Hypothyroidism   Plan: Continue armour thyroid  Today's Summary:  58 y/o M admitted to [facility] with CAP, developed AMS and found to have a malfunctioning VP shunt that neurosurgery assess as normal now, will transfer to tele today and to [facility], PCCM will sign off, please call back if needed.  Care during the described time interval was provided by me and/or other providers on the critical care team.  I have reviewed this patient's available data, including medical history, events of note, physical examination and test results as part of my evaluation  Rush Farmer, M.D. [facility] Pulmonary/Critical Care Medicine. Pager:  [phone]. After hours pager: [phone].  05/14/2014, 10:21 AM

## 2014-05-15 LAB — BASIC METABOLIC PANEL
Anion gap: 15 (ref 5–15)
BUN: 17 mg/dL (ref 6–23)
CO2: 25 mEq/L (ref 19–32)
Calcium: 8.6 mg/dL (ref 8.4–10.5)
Chloride: 97 mEq/L (ref 96–112)
Creatinine, Ser: 0.69 mg/dL (ref 0.50–1.35)
GFR calc Af Amer: >90 mL/min (ref >90)
GFR calc non Af Amer: >90 mL/min (ref >90)
Glucose, Bld: 88 mg/dL (ref 70–99)
Potassium: 3.6 mEq/L — ABNORMAL LOW (ref 3.7–5.3)
Sodium: 137 mEq/L (ref 137–147)

## 2014-05-15 LAB — CBC
HCT: 43.2 % (ref 39.0–52.0)
Hemoglobin: 15.2 g/dL (ref 13.0–17.0)
MCH: 29.6 pg (ref 26.0–34.0)
MCHC: 35.2 g/dL (ref 30.0–36.0)
MCV: 84 fL (ref 78.0–100.0)
Platelets: 261 10*3/uL (ref 150–400)
RBC: 5.14 MIL/uL (ref 4.22–5.81)
RDW: 12.8 % (ref 11.5–15.5)
WBC: 9.7 10*3/uL (ref 4.0–10.5)

## 2014-05-15 LAB — CSF CULTURE W GRAM STAIN: Culture: NO GROWTH

## 2014-05-15 LAB — GLUCOSE, CAPILLARY
Glucose-Capillary: 122 mg/dL — ABNORMAL HIGH (ref 70–99)
Glucose-Capillary: 245 mg/dL — ABNORMAL HIGH (ref 70–99)
Glucose-Capillary: 86 mg/dL (ref 70–99)
Glucose-Capillary: 87 mg/dL (ref 70–99)

## 2014-05-15 LAB — CSF CULTURE: Gram Stain: NONE SEEN

## 2014-05-15 LAB — PHOSPHORUS: Phosphorus: 3 mg/dL (ref 2.3–4.6)

## 2014-05-15 LAB — MAGNESIUM: Magnesium: 2.3 mg/dL (ref 1.5–2.5)

## 2014-05-15 MED ORDER — PREDNISONE 5 MG PO TABS: ORAL_TABLET | ORAL | Status: DC

## 2014-05-15 NOTE — Progress Notes (Signed)
Home medication Nucynta counted and returned with patient's wife , [name].   Ave Filter, RN

## 2014-05-15 NOTE — Progress Notes (Signed)
Discharge instructions reviewed with patient/family. All questions answered at this time. All belongs sent with patient.  Ave Filter, RN

## 2014-05-15 NOTE — Discharge Summary (Signed)
[facility]  Physician Discharge Summary   Patient ID: Kirk Robinson MRN: [id number] DOB/AGE: [birth year] 58 y.o.  Admit date: 05/11/2014 Discharge date: 05/15/2014  PCP: Reginia Forts, MD  DISCHARGE DIAGNOSES:  Active Problems:   Hyperlipidemia   Depression   COPD (chronic obstructive pulmonary disease)   Hypothyroidism following radioiodine therapy   Pulmonary fibrosis   GERD (gastroesophageal reflux disease)   Obstructed VP shunt   Chronic pain syndrome   RECOMMENDATIONS FOR OUTPATIENT FOLLOW UP: 1. 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 He was initially admitted to [facility] on 9/15 for URI symptoms & hypoxia. Treated as PNA with improvement but subsequently developed altered mental status and CT of head was obtained with concerns for malfunctioning VP shunt with ventriculomegaly. He was transferred to [facility] for further evaluation. He has PMH of chronic pain, CVA (remote), PVD, COPD on 3L [place] O2 at baseline, NSIP on azathioprine followed by Dr. Raul Del.  Consultations:  Neurosurgery: Dr. Christella Noa  Procedures:  Shunt valve was reprogrammed  HOSPITAL COURSE:   Malfunctioning VP Shunt - ventriculomegaly noted on CT Head at [facility]  Hx CVA s/p Shunt (remote)  ? Seizures - noted twitching activity on admit   Patient was transferred to [facility]. He was seen by Neurosurgery. They reprogrammed the shunt valve. Repeat scans were obtained and it was felt that the shunt was functioning well. He was initially started on Keppra due to the twitching movements. But this was subsequently stopped. Discussed with Dr. Christella Noa and he states patient can be discharged. Can follow up as needed.   CAP - dx  on admission at [facility]  O2 Dependent COPD - 3L baseline  NSIP - on azathioprine, followed by Dr. Vella Kohler, reportedly biopsy confirmed   He completed the course of antibiotics. He remains stable on his home oxygen dose. Will continue to taper steroids and will let his pulmonologist address as outpatient. He was asked to continue azathioprine.   Chronic Pain  Anxiety  Depression  May resume all of his home medications.   GERD  Stable.  HLD  Continue home medications.   Hypothyroidism  Continue home medications.   Patient is improved. He was seen by PT and OT. Home health will be arranged. He will follow up with his pulmonologist.    PERTINENT LABS:  The results of significant diagnostics from this hospitalization (including imaging, microbiology, ancillary and laboratory) are listed below for reference.    Microbiology: Recent Results (from the past 240 hour(s))  MRSA PCR SCREENING     Status: None   Collection Time    05/11/14  5:02 PM      Result Value Ref Range Status   MRSA by PCR NEGATIVE  NEGATIVE Final   Comment:            The GeneXpert MRSA Assay (FDA     approved for NASAL specimens     only), is one component of a     comprehensive MRSA colonization     surveillance program. It is not     intended to diagnose MRSA     infection nor to guide or     monitor treatment for  MRSA infections.  CSF CULTURE     Status: None   Collection Time    05/12/14  9:00 AM      Result Value Ref Range Status   Specimen Description CSF   Final   Special Requests NONE   Final   Gram Stain     Final   Value: NO WBC SEEN     NO ORGANISMS SEEN     Performed at [facility]     Performed at [facility]   Culture     Final   Value: NO GROWTH 3 DAYS     Performed at [facility]   Report Status 05/15/2014 FINAL   Final  GRAM STAIN     Status: None   Collection Time    05/12/14  9:00 AM      Result Value Ref Range Status   Specimen Description CSF    Final   Special Requests NONE   Final   Gram Stain     Final   Value: NO WBC SEEN     NO ORGANISMS SEEN     CYTOSPIN   Report Status 05/12/2014 FINAL   Final     Labs: Basic Metabolic Panel:  Recent Labs Lab 05/11/14 1908 05/12/14 0240 05/13/14 0216 05/14/14 0246 05/15/14 0730  NA  --  140 137 136* 137  K  --  4.2 3.8 3.7 3.6*  CL  --  97 94* 97 97  CO2  --  33* 29 26 25   GLUCOSE  --  140* 110* 88 88  BUN  --  18 21 21 17   CREATININE 0.86 0.72 0.72 0.67 0.69  CALCIUM  --  8.9 9.0 8.4 8.6  MG  --   --  2.2 2.3 2.3  PHOS  --   --  3.7 2.9 3.0   CBC:  Recent Labs Lab 05/11/14 1908 05/12/14 0240 05/13/14 0216 05/14/14 0246 05/15/14 0730  WBC 7.2 8.4 10.0 9.2 9.7  HGB 12.8* 13.1 14.7 15.0 15.2  HCT 37.6* 38.5* 42.6 42.2 43.2  MCV 86.4 85.9 84.9 85.1 84.0  PLT 241 261 285 263 261   CBG:  Recent Labs Lab 05/14/14 2026 05/15/14 0008 05/15/14 0402 05/15/14 0824 05/15/14 1139  GLUCAP 127* 122* 87 86 245*     IMAGING STUDIES Ct Abdomen Pelvis Wo Contrast  05/12/2014   CLINICAL DATA:  Ventriculoperitoneal shunt.  EXAM: CT ABDOMEN AND PELVIS WITHOUT CONTRAST  TECHNIQUE: Multidetector CT imaging of the abdomen and pelvis was performed following the standard protocol without IV contrast.  COMPARISON:  None.  FINDINGS: Bilateral pars defects are seen at L5. Bilateral interstitial lung opacities are noted concerning for possible pulmonary edema or interstitial inflammation.  No gallstones are noted. No focal abnormality is noted in the liver or spleen. Partial fatty replacement of the pancreas is noted. Adrenal glands and kidneys appear normal. No hydronephrosis or renal obstruction is noted. Minimal atherosclerotic calcifications of abdominal aorta are noted without aneurysm formation. The appendix appears normal. There is no evidence of bowel obstruction. Ventriculoperitoneal shunt is seen entering anterior abdominal wall with tip in left pericolic region. No abnormal  fluid collection is noted around the tip or anywhere else within the abdomen. Mild urinary bladder distention is noted. Minimally enlarged retroperitoneal lymph nodes are noted most likely inflammatory in origin. Inflammatory changes are noted in the mesenteric tissues which are nonspecific.  IMPRESSION: Bilateral interstitial lung opacities are noted in the visualized lung bases concerning for  possible pulmonary edema or interstitial inflammation.  Ventriculoperitoneal shunt is seen entering anterior abdominal wall with distal tip in left pericolic gutter ; no abnormal fluid collection is noted.  Mild inflammatory mesenteric stranding and mildly enlarged retroperitoneal lymph nodes are noted consistent with nonspecific inflammation. No definite abscess is noted.   Electronically Signed   By: Sabino Dick M.D.   On: 05/12/2014 13:03   Dg Skull 1-3 Views  05/12/2014   CLINICAL DATA:  Increase lethargy, VP shunt  EXAM: SKULL - 1-3 VIEW  COMPARISON:  01/13/2012 skull radiograph, MRI brain 01/12/2012  FINDINGS: Intraventricular limb of a LEFT side VP shunt extends from LEFT parietal region with tip across midline.  Radiolucent segment of shunt tubing is identified similar to previous exam.  No definite shunt discontinuity seen through the LEFT cervical region.  IMPRESSION: No definite focal shunt tubing abnormalities identified.   Electronically Signed   By: Lavonia Dana M.D.   On: 05/12/2014 10:20   Dg Chest 1 View  05/12/2014   CLINICAL DATA:  Shunt series  EXAM: CHEST - 1 VIEW  COMPARISON:  05/12/2014, 01/14/2013  FINDINGS: Ventriculoperitoneal shunt catheter with the visualized tubing intact.  Bilateral diffuse interstitial thickening. No pleural effusion or pneumothorax. Stable cardiomediastinal silhouette.  The osseous structures are unremarkable.  IMPRESSION: 1. Bilateral diffuse interstitial thickening concerning for multilobar pneumonitis versus pulmonary edema versus ARDS.   Electronically Signed   By:  Kathreen Devoid   On: 05/12/2014 10:19   Ct Head Wo Contrast  05/14/2014   CLINICAL DATA:  Evaluate ventricles  EXAM: CT HEAD WITHOUT CONTRAST  TECHNIQUE: Contiguous axial images were obtained from the base of the skull through the vertex without intravenous contrast.  COMPARISON:  03/17/2006  FINDINGS: VP shunt via LEFT frontoparietal approach with tip at RIGHT lateral ventricle at foramen of [place].  Mild atrophy.  Minimal asymmetry of the lateral ventricles LEFT larger than RIGHT, with RIGHT slightly larger than on the previous exam.  No midline shift or mass effect.  Minimal white matter hypoattenuation in frontal regions.  No intracranial hemorrhage, mass lesion or evidence acute infarction.  No extra-axial fluid collections.  Bones and sinuses unremarkable.  IMPRESSION: Minimal asymmetry of the lateral ventricles within acceptable limits.  No acute intracranial abnormalities.  When compared to the previous exam RIGHT lateral ventricle slightly larger and now closer in size to LEFT lateral ventricle.   Electronically Signed   By: Lavonia Dana M.D.   On: 05/14/2014 09:08   US Abdomen Limited  05/12/2014   CLINICAL DATA:  Possible pseudocyst around ventriculoperitoneal shunt tip.  EXAM: LIMITED ABDOMINAL ULTRASOUND  COMPARISON:  Radiograph of same day.  FINDINGS: The distal tip of the ventriculoperitoneal shunt is not well visualized on this study. No abnormal fluid collection is noted in either lower quadrant of the abdomen.  IMPRESSION: No abnormal fluid collection is noted in either lower quadrant of the abdomen.   Electronically Signed   By: Sabino Dick M.D.   On: 05/12/2014 12:28   Dg Chest Port 1 View  05/12/2014   CLINICAL DATA:  Upper respiratory tract infection and hypoxia.  EXAM: PORTABLE CHEST - 1 VIEW  COMPARISON:  01/14/2013.  FINDINGS: Enlarged cardiac silhouette with an interval increase in size. Interval airspace opacity throughout both lungs. No pleural fluid seen. Mild scoliosis.   IMPRESSION: Interval cardiomegaly and diffuse bilateral alveolar edema, pneumonia or ARDS.   Electronically Signed   By: Enrique Sack M.D.   On: 05/12/2014 07:40  Dg Abd Portable 1v  05/12/2014   CLINICAL DATA:  Shunt series  EXAM: PORTABLE ABDOMEN - 1 VIEW  COMPARISON:  None.  FINDINGS: Visualized shunt catheter tubing is intact. There is no bowel dilatation to suggest obstruction. There is no evidence of pneumoperitoneum, portal venous gas or pneumatosis. There are no pathologic calcifications along the expected course of the ureters.The osseous structures are unremarkable.  IMPRESSION: Visualize shunt catheter tubing is intact.   Electronically Signed   By: Kathreen Devoid   On: 05/12/2014 10:20    DISCHARGE EXAMINATION: Filed Vitals:   05/14/14 2055 05/15/14 0112 05/15/14 0806 05/15/14 1021  BP: 108/68 118/83 113/79 113/79  Pulse: 82 82 71 75  Temp: 98.4 F (36.9 C) 97.9 F (36.6 C) 97.3 F (36.3 C) 97.1 F (36.2 C)  TempSrc: Oral Oral Axillary Oral  Resp: 20 18  18   Height:      Weight:      SpO2: 95% 95% 94% 92%   General appearance: alert, cooperative, appears stated age and no distress Resp: clear to auscultation bilaterally Cardio: regular rate and rhythm, S1, S2 normal, no murmur, click, rub or gallop GI: soft, non-tender; bowel sounds normal; no masses,  no organomegaly  DISPOSITION: Home with wife  Discharge Instructions   Diet - low sodium heart healthy    Complete by:  As directed      Discharge instructions    Complete by:  As directed   Please follow up with Dr. Vella Kohler next week. Call Dr. Cyndy Freeze as needed.     Increase activity slowly    Complete by:  As directed            ALLERGIES:  Allergies  Allergen Reactions  . Erythromycin Nausea And Vomiting  . Iodinated Diagnostic Agents Shortness Of Breath  . Advair Diskus [Fluticasone-Salmeterol]     Short of breath  . Darvocet [Propoxyphene N-Acetaminophen]   . Doxycycline Hyclate Swelling    Tongue  swelling, upset stomach  . Septra [Sulfamethoxazole-Trimethoprim]     Discharge Medication List as of 05/15/2014 12:46 PM    START taking these medications   Details  predniSONE (DELTASONE) 5 MG tablet Take 4 tablets twice daily for 3 days, then take 4 tablets once daily for 3 days, then take 3 tablets once daily till seen by Dr. Vella Kohler., Print      CONTINUE these medications which have NOT CHANGED   Details  atorvastatin (LIPITOR) 20 MG tablet Take 20 mg by mouth daily., Until Discontinued, Historical Med    diazepam (VALIUM) 5 MG tablet Take 5 mg by mouth daily., Starting 04/25/2014, Until Discontinued, Historical Med    Morphine Sulfate, Concentrate, 100 MG/5ML SOLN Take 100 mg by mouth 3 (three) times daily., Until Discontinued, Historical Med    omeprazole (PRILOSEC) 20 MG capsule Take 1 capsule (20 mg total) by mouth daily., Starting 02/26/2014, Until Discontinued, Normal    sertraline (ZOLOFT) 100 MG tablet Take 1.5 tablets (150 mg total) by mouth daily., Starting 02/26/2014, Until Discontinued, Normal    tapentadol (NUCYNTA) 50 MG TABS Take 75 mg by mouth daily., Until Discontinued, Historical Med    thyroid (ARMOUR) 120 MG tablet Take 120 mg by mouth daily., Until Discontinued, Historical Med    tiotropium (SPIRIVA) 18 MCG inhalation capsule Place 18 mcg into inhaler and inhale daily., Until Discontinued, Historical Med    Vitamin D, Ergocalciferol, (DRISDOL) 50000 UNITS CAPS Take 50,000 Units by mouth daily., Until Discontinued, Historical Med  STOP taking these medications     amoxicillin-clavulanate (AUGMENTIN) 500-125 MG per tablet      predniSONE (STERAPRED UNI-PAK) 5 MG TABS          TOTAL DISCHARGE TIME: 35 mins  [facility] Pager [phone]  05/15/2014, 2:16 PM

## 2014-05-15 NOTE — Progress Notes (Signed)
Physical Therapy Treatment Patient Details Name: Rande Roylance MRN: [id number] DOB: 09-18-1955 Today's Date: 05/15/2014    History of Present Illness 58 y/o M with prior VP shunt placement initially admitted to [facility] on 9/15 for URI symptoms & hypoxia.  Rx'd as PNA with improvement but subsequently developed AMS and CT of head was obtained with concerns for malfunctioning VP shunt with ventriculomegaly.    PT Comments    Progressing well.  Pt and wife feel based on his present function they are good to go home.  Follow Up Recommendations  Home health PT;Supervision for mobility/OOB     Equipment Recommendations  None recommended by PT    Recommendations for Other Services       Precautions / Restrictions Precautions Precautions: Fall Restrictions Weight Bearing Restrictions: No    Mobility  Bed Mobility Overal bed mobility: Needs Assistance Bed Mobility: Supine to Sit;Sit to Supine     Supine to sit: Min guard Sit to supine: Min guard   General bed mobility comments: used rail and rocked to gain momentum  Transfers Overall transfer level: Needs assistance Equipment used: None Transfers: Sit to/from Stand Sit to Stand: Min guard         General transfer comment: mild instability managed by holding to oxyen caddy  Ambulation/Gait Ambulation/Gait assistance: Min guard Ambulation Distance (Feet): 300 Feet Assistive device:  (portable O2 caddy) Gait Pattern/deviations: Step-through pattern;Drifts right/left Gait velocity: slow to moderate. Gait velocity interpretation: Below normal speed for age/gender General Gait Details: should be steady with cane or furniture in a home-like setting   Stairs            Wheelchair Mobility    Modified Rankin (Stroke Patients Only)       Balance Overall balance assessment: Needs assistance Sitting-balance support: No upper extremity supported Sitting balance-Leahy Scale: Fair     Standing balance support:  Bilateral upper extremity supported Standing balance-Leahy Scale: Fair Standing balance comment: likely some visual disturbances and decr attention cause mild instability which should be able to be managed in a home-like environment                    Cognition Arousal/Alertness: Awake/alert Behavior During Therapy: WFL for tasks assessed/performed Overall Cognitive Status: Impaired/Different from baseline (improving per wife) Area of Impairment: Attention;Following commands   Current Attention Level: Alternating;Divided   Following Commands: Follows one step commands consistently     Problem Solving: Slow processing      Exercises      General Comments        Pertinent Vitals/Pain Pain Assessment: No/denies pain    Home Living                      Prior Function            PT Goals (current goals can now be found in the care plan section) Acute Rehab PT Goals Patient Stated Goal: to go home Time For Goal Achievement: 05/28/14 Potential to Achieve Goals: Good Progress towards PT goals: Progressing toward goals    Frequency  Min 3X/week    PT Plan Discharge plan needs to be updated    Co-evaluation             End of Session Equipment Utilized During Treatment: Gait belt;Oxygen Activity Tolerance: Patient tolerated treatment well Patient left: in bed;with call bell/phone within reach;with family/visitor present     Time: 9833-8250 PT Time Calculation (min): 20 min  Charges:  $  Gait Training: 8-22 mins                    G Codes:      Enez Monahan, Tessie Fass 05/15/2014, 11:54 AM 05/15/2014  Donnella Sham, PT [phone]  (pager)

## 2014-05-16 NOTE — Care Management Note (Addendum)
  Page 1 of 1   05/16/2014     11:46:44 AM CARE MANAGEMENT NOTE 05/16/2014  Patient:  Kirk Robinson, Kirk Robinson   Account Number:  [id number]  Date Initiated:  05/15/2014  Documentation initiated by:  Lorne Skeens  Subjective/Objective Assessment:   Patient was admittted with hypoxia, hydrocephalus. Lives at home with wife.     Action/Plan:   Will follow for discharge needs pending PT/OT evals and physician orders.   Anticipated DC Date:  05/15/2014   Anticipated DC Plan:  IP REHAB FACILITY      DC Planning Services  CM consult      Choice offered to / List presented to:             Status of service:  Completed, signed off Medicare Important Message given?   (If response is "NO", the following Medicare IM given date fields will be blank) Date Medicare IM given:   Medicare IM given by:   Date Additional Medicare IM given:   Additional Medicare IM given by:    Discharge Disposition:  [place]  Per UR Regulation:  Reviewed for med. necessity/level of care/duration of stay  If discussed at [place] of Stay Meetings, dates discussed:    Comments:   05/16/14 [employer], MSN, [place] with patient's wife regarding home health orders.  Per wife, patient is not interested in home health therapies at this time.  CM encouraged patient to contact his PCP should any needs arise in the future.   05/15/14 [place], MSN, CM- Patient was discharged prior to being seen by case management.  Home health orders were noted after discharge.  CM attempted to contact patient regarding home health orders. Voicemail was left. Awaiting return call.

## 2014-05-18 ENCOUNTER — Inpatient Hospital Stay (HOSPITAL_COMMUNITY)
Admission: EM | Admit: 2014-05-18 | Discharge: 2014-05-24 | DRG: 982 | Disposition: A | Discharge status: Home / Self Care | Payer: 59 | Attending: Neurological Surgery | Admitting: Neurological Surgery

## 2014-05-18 ENCOUNTER — Emergency Department (HOSPITAL_COMMUNITY): Payer: 59

## 2014-05-18 ENCOUNTER — Encounter (HOSPITAL_COMMUNITY): Payer: Self-pay | Admitting: Emergency Medicine

## 2014-05-18 DIAGNOSIS — J8410 Pulmonary fibrosis, unspecified: Secondary | ICD-10-CM | POA: Diagnosis present

## 2014-05-18 DIAGNOSIS — G9389 Other specified disorders of brain: Secondary | ICD-10-CM | POA: Diagnosis present

## 2014-05-18 DIAGNOSIS — Z8673 Personal history of transient ischemic attack (TIA), and cerebral infarction without residual deficits: Secondary | ICD-10-CM | POA: Diagnosis not present

## 2014-05-18 DIAGNOSIS — Z79899 Other long term (current) drug therapy: Secondary | ICD-10-CM | POA: Diagnosis not present

## 2014-05-18 DIAGNOSIS — Z9981 Dependence on supplemental oxygen: Secondary | ICD-10-CM | POA: Diagnosis not present

## 2014-05-18 DIAGNOSIS — Z982 Presence of cerebrospinal fluid drainage device: Secondary | ICD-10-CM | POA: Diagnosis not present

## 2014-05-18 DIAGNOSIS — R4182 Altered mental status, unspecified: Secondary | ICD-10-CM

## 2014-05-18 DIAGNOSIS — E039 Hypothyroidism, unspecified: Secondary | ICD-10-CM | POA: Diagnosis present

## 2014-05-18 DIAGNOSIS — T82599D Other mechanical complication of unspecified cardiac and vascular devices and implants, subsequent encounter: Secondary | ICD-10-CM | POA: Diagnosis present

## 2014-05-18 DIAGNOSIS — J449 Chronic obstructive pulmonary disease, unspecified: Secondary | ICD-10-CM | POA: Diagnosis present

## 2014-05-18 DIAGNOSIS — T8509XD Other mechanical complication of ventricular intracranial (communicating) shunt, subsequent encounter: Secondary | ICD-10-CM

## 2014-05-18 DIAGNOSIS — Z7952 Long term (current) use of systemic steroids: Secondary | ICD-10-CM

## 2014-05-18 DIAGNOSIS — G910 Communicating hydrocephalus: Secondary | ICD-10-CM | POA: Diagnosis present

## 2014-05-18 DIAGNOSIS — F411 Generalized anxiety disorder: Secondary | ICD-10-CM | POA: Diagnosis present

## 2014-05-18 DIAGNOSIS — T8509XA Other mechanical complication of ventricular intracranial (communicating) shunt, initial encounter: Secondary | ICD-10-CM

## 2014-05-18 DIAGNOSIS — I739 Peripheral vascular disease, unspecified: Secondary | ICD-10-CM | POA: Diagnosis present

## 2014-05-18 DIAGNOSIS — G8929 Other chronic pain: Secondary | ICD-10-CM | POA: Diagnosis present

## 2014-05-18 DIAGNOSIS — K219 Gastro-esophageal reflux disease without esophagitis: Secondary | ICD-10-CM | POA: Diagnosis present

## 2014-05-18 DIAGNOSIS — F17210 Nicotine dependence, cigarettes, uncomplicated: Secondary | ICD-10-CM | POA: Diagnosis present

## 2014-05-18 DIAGNOSIS — E780 Pure hypercholesterolemia: Secondary | ICD-10-CM | POA: Diagnosis present

## 2014-05-18 DIAGNOSIS — G919 Hydrocephalus, unspecified: Secondary | ICD-10-CM | POA: Diagnosis present

## 2014-05-18 DIAGNOSIS — T8502XA Displacement of ventricular intracranial (communicating) shunt, initial encounter: Principal | ICD-10-CM | POA: Diagnosis present

## 2014-05-18 DIAGNOSIS — T8501XA Breakdown (mechanical) of ventricular intracranial (communicating) shunt, initial encounter: Secondary | ICD-10-CM | POA: Diagnosis present

## 2014-05-18 DIAGNOSIS — G9340 Encephalopathy, unspecified: Secondary | ICD-10-CM | POA: Diagnosis present

## 2014-05-18 LAB — TROPONIN I: Troponin I: <0.3 ng/mL (ref <0.30)

## 2014-05-18 LAB — CBC
HCT: 39.9 % (ref 39.0–52.0)
HCT: 42.9 % (ref 39.0–52.0)
Hemoglobin: 13.9 g/dL (ref 13.0–17.0)
Hemoglobin: 15.3 g/dL (ref 13.0–17.0)
MCH: 29.8 pg (ref 26.0–34.0)
MCH: 29.9 pg (ref 26.0–34.0)
MCHC: 34.8 g/dL (ref 30.0–36.0)
MCHC: 35.7 g/dL (ref 30.0–36.0)
MCV: 83.5 fL (ref 78.0–100.0)
MCV: 85.8 fL (ref 78.0–100.0)
Platelets: 266 10*3/uL (ref 150–400)
Platelets: 338 10*3/uL (ref 150–400)
RBC: 4.65 MIL/uL (ref 4.22–5.81)
RBC: 5.14 MIL/uL (ref 4.22–5.81)
RDW: 12.9 % (ref 11.5–15.5)
RDW: 13 % (ref 11.5–15.5)
WBC: 8.3 10*3/uL (ref 4.0–10.5)
WBC: 9.8 10*3/uL (ref 4.0–10.5)

## 2014-05-18 LAB — CSF CELL COUNT WITH DIFFERENTIAL
RBC Count, CSF: 0 /mm3
Tube #: 4
WBC, CSF: 1 /mm3 (ref 0–5)

## 2014-05-18 LAB — I-STAT ARTERIAL BLOOD GAS, ED
Acid-Base Excess: 2 mmol/L (ref 0.0–2.0)
Bicarbonate: 28.6 mEq/L — ABNORMAL HIGH (ref 20.0–24.0)
O2 Saturation: 92 %
Patient temperature: 98.6
TCO2: 30 mmol/L (ref 0–100)
pCO2 arterial: 52.8 mmHg — ABNORMAL HIGH (ref 35.0–45.0)
pH, Arterial: 7.342 — ABNORMAL LOW (ref 7.350–7.450)
pO2, Arterial: 70 mmHg — ABNORMAL LOW (ref 80.0–100.0)

## 2014-05-18 LAB — COMPREHENSIVE METABOLIC PANEL
ALT: 24 U/L (ref 0–53)
ALT: 29 U/L (ref 0–53)
AST: 18 U/L (ref 0–37)
AST: 19 U/L (ref 0–37)
Albumin: 3.2 g/dL — ABNORMAL LOW (ref 3.5–5.2)
Albumin: 3.6 g/dL (ref 3.5–5.2)
Alkaline Phosphatase: 73 U/L (ref 39–117)
Alkaline Phosphatase: 89 U/L (ref 39–117)
Anion gap: 10 (ref 5–15)
Anion gap: 13 (ref 5–15)
BUN: 13 mg/dL (ref 6–23)
BUN: 14 mg/dL (ref 6–23)
CO2: 29 mEq/L (ref 19–32)
CO2: 32 mEq/L (ref 19–32)
Calcium: 8.7 mg/dL (ref 8.4–10.5)
Calcium: 9.3 mg/dL (ref 8.4–10.5)
Chloride: 94 mEq/L — ABNORMAL LOW (ref 96–112)
Chloride: 94 mEq/L — ABNORMAL LOW (ref 96–112)
Creatinine, Ser: 0.87 mg/dL (ref 0.50–1.35)
Creatinine, Ser: 0.97 mg/dL (ref 0.50–1.35)
GFR calc Af Amer: >90 mL/min (ref >90)
GFR calc Af Amer: >90 mL/min (ref >90)
GFR calc non Af Amer: >90 mL/min (ref >90)
GFR calc non Af Amer: >90 mL/min (ref >90)
Glucose, Bld: 112 mg/dL — ABNORMAL HIGH (ref 70–99)
Glucose, Bld: 147 mg/dL — ABNORMAL HIGH (ref 70–99)
Potassium: 3.4 mEq/L — ABNORMAL LOW (ref 3.7–5.3)
Potassium: 4.1 mEq/L (ref 3.7–5.3)
Sodium: 136 mEq/L — ABNORMAL LOW (ref 137–147)
Sodium: 136 mEq/L — ABNORMAL LOW (ref 137–147)
Total Bilirubin: 0.6 mg/dL (ref 0.3–1.2)
Total Bilirubin: 0.8 mg/dL (ref 0.3–1.2)
Total Protein: 6.6 g/dL (ref 6.0–8.3)
Total Protein: 7.5 g/dL (ref 6.0–8.3)

## 2014-05-18 LAB — GRAM STAIN: Gram Stain: NONE SEEN

## 2014-05-18 LAB — I-STAT CG4 LACTIC ACID, ED: Lactic Acid, Venous: 0.71 mmol/L (ref 0.5–2.2)

## 2014-05-18 LAB — T4: T4, Total: 4.6 ug/dL — ABNORMAL LOW (ref 4.5–12.0)

## 2014-05-18 LAB — PROTEIN AND GLUCOSE, CSF
Glucose, CSF: 87 mg/dL — ABNORMAL HIGH (ref 43–76)
Total  Protein, CSF: 5 mg/dL — ABNORMAL LOW (ref 15–45)

## 2014-05-18 LAB — TSH: TSH: 1.44 u[IU]/mL (ref 0.350–4.500)

## 2014-05-18 MED ORDER — SODIUM CHLORIDE 0.9 % IV SOLN
INTRAVENOUS | Status: AC
  Administered 2014-05-18: 09:00:00 via INTRAVENOUS

## 2014-05-18 MED ORDER — BISACODYL 10 MG RE SUPP: 10.0000 mg | Freq: Every day | RECTAL | Status: DC | PRN

## 2014-05-18 MED ORDER — PREDNISONE 5 MG PO TABS: 5.0000 mg | ORAL_TABLET | ORAL | Status: DC

## 2014-05-18 MED ORDER — PANTOPRAZOLE SODIUM 40 MG PO TBEC
40.0000 mg | DELAYED_RELEASE_TABLET | Freq: Every day | ORAL | Status: DC
  Administered 2014-05-18 – 2014-05-24 (×6): 40 mg via ORAL
  Filled 2014-05-18 (×6): qty 1

## 2014-05-18 MED ORDER — POLYETHYLENE GLYCOL 3350 17 G PO PACK
17.0000 g | PACK | Freq: Every day | ORAL | Status: DC | PRN
Start: 2014-05-18 — End: 2014-05-20

## 2014-05-18 MED ORDER — MORPHINE SULFATE ER 100 MG PO TBCR
100.0000 mg | EXTENDED_RELEASE_TABLET | Freq: Three times a day (TID) | ORAL | Status: DC
  Administered 2014-05-18 – 2014-05-24 (×17): 100 mg via ORAL
  Filled 2014-05-18 (×18): qty 1

## 2014-05-18 MED ORDER — PREDNISONE 5 MG PO TABS
15.0000 mg | ORAL_TABLET | Freq: Every day | ORAL | Status: DC
  Administered 2014-05-21 – 2014-05-24 (×4): 15 mg via ORAL
  Filled 2014-05-18 (×4): qty 3

## 2014-05-18 MED ORDER — DOCUSATE SODIUM 100 MG PO CAPS
100.0000 mg | ORAL_CAPSULE | Freq: Two times a day (BID) | ORAL | Status: DC
  Administered 2014-05-18 – 2014-05-19 (×3): 100 mg via ORAL
  Filled 2014-05-18 (×4): qty 1

## 2014-05-18 MED ORDER — ATORVASTATIN CALCIUM 10 MG PO TABS
20.0000 mg | ORAL_TABLET | Freq: Every day | ORAL | Status: DC
  Administered 2014-05-18 – 2014-05-24 (×6): 20 mg via ORAL
  Filled 2014-05-18 (×6): qty 2

## 2014-05-18 MED ORDER — THYROID 60 MG PO TABS
90.0000 mg | ORAL_TABLET | Freq: Every day | ORAL | Status: DC
  Administered 2014-05-18 – 2014-05-24 (×6): 90 mg via ORAL
  Filled 2014-05-18 (×7): qty 1

## 2014-05-18 MED ORDER — PREDNISONE 5 MG PO TABS
5.0000 mg | ORAL_TABLET | Freq: Every day | ORAL | Status: DC
Start: 2014-05-18 — End: 2014-05-18

## 2014-05-18 MED ORDER — ENOXAPARIN SODIUM 40 MG/0.4ML ~~LOC~~ SOLN
40.0000 mg | SUBCUTANEOUS | Status: DC
  Administered 2014-05-18 – 2014-05-19 (×2): 40 mg via SUBCUTANEOUS
  Filled 2014-05-18 (×2): qty 0.4

## 2014-05-18 MED ORDER — THIAMINE HCL 100 MG/ML IJ SOLN
Freq: Once | INTRAVENOUS | Status: AC
  Administered 2014-05-18: 09:00:00 via INTRAVENOUS
  Filled 2014-05-18: qty 1000

## 2014-05-18 MED ORDER — TIOTROPIUM BROMIDE MONOHYDRATE 18 MCG IN CAPS
18.0000 ug | ORAL_CAPSULE | Freq: Every day | RESPIRATORY_TRACT | Status: DC
  Administered 2014-05-19 – 2014-05-24 (×5): 18 ug via RESPIRATORY_TRACT
  Filled 2014-05-18 (×2): qty 5

## 2014-05-18 MED ORDER — SERTRALINE HCL 50 MG PO TABS
150.0000 mg | ORAL_TABLET | Freq: Every day | ORAL | Status: DC
  Administered 2014-05-18 – 2014-05-24 (×6): 150 mg via ORAL
  Filled 2014-05-18 (×12): qty 1

## 2014-05-18 MED ORDER — TAPENTADOL HCL 50 MG PO TABS: 75.0000 mg | ORAL_TABLET | ORAL | Status: DC | PRN

## 2014-05-18 MED ORDER — DIAZEPAM 5 MG PO TABS
5.0000 mg | ORAL_TABLET | Freq: Every day | ORAL | Status: DC
  Administered 2014-05-18 – 2014-05-24 (×6): 5 mg via ORAL
  Filled 2014-05-18 (×6): qty 1

## 2014-05-18 MED ORDER — PREDNISONE 20 MG PO TABS
20.0000 mg | ORAL_TABLET | Freq: Every day | ORAL | Status: AC
  Administered 2014-05-18 – 2014-05-19 (×2): 20 mg via ORAL
  Filled 2014-05-18 (×3): qty 1

## 2014-05-18 MED ORDER — SENNA 8.6 MG PO TABS
1.0000 | ORAL_TABLET | Freq: Two times a day (BID) | ORAL | Status: DC
  Administered 2014-05-18 – 2014-05-19 (×3): 8.6 mg via ORAL
  Filled 2014-05-18 (×4): qty 1

## 2014-05-18 NOTE — ED Notes (Signed)
Pt back from radiology 

## 2014-05-18 NOTE — ED Notes (Addendum)
Pt comes to ED. Pts wife states that pt was dc'd last Thursday. STates that he was here for AMS related to his ventricular shunt. Dr Garnette Gunner is pts neurologist adjusted pts shunt and pt was at his baseline which is AAOx4. Pt's wife states that at 11pm pt went down for a nap and it was difficult to arouse him. Pt's wife states that pt is not acting himself at this time.  Pts family states that pt normally wears 3.5 L O2. Also states that he has trush from antibiotic use.

## 2014-05-18 NOTE — ED Notes (Signed)
Pt taken to radiology

## 2014-05-18 NOTE — Progress Notes (Signed)
Patient arrived to 4N05 from the ED via stretcher. Patient able to follow commands but did not answer any questions that I asked him. Wife at bedside. VSS. Oxygen placed on patient. Patient in bed lying comfortably. Will continue to monitor patient closely. Ileene Rubens Harman [place],RN

## 2014-05-18 NOTE — H&P (Signed)
Kirk Robinson is an 58 y.o. male.   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 He's not had any difficulties until the past couple of weeks. He was in the hospital for presumed pneumonia. He then developed a change in his level of consciousness and was felt that this may be related to malfunction of the shunt. He was transferred to come in hospital and after reprogramming his ventriculoperitoneal shunt the patient's neurologic status spontaneously improved. Ventricular size on most recent CT scan appeared fairly normal. Dr. Cyndy Freeze at that time had tapped the shunt which yielded normal spinal fluid. Plans to repair the distal catheter of the shunt course graft as his neurologic status was normal. He was discharged on the 24th. The wife notes that about a day later his level of consciousness the deteriorated. A CT scan performed today in the morning demonstrates that the patient's ventricular size is markedly increased. It appears that he has shunt malfunction.  Past Medical History  Diagnosis Date  . Altered mental status   . Pneumonia, organism unspecified   . Memory loss   . Osteoporosis, unspecified     bone density per Morivati in 2012  . Iodine hypothyroidism   . Anxiety state, unspecified   . Pure hypercholesterolemia   . Hematuria 03/16/2007  . Allergic rhinitis, cause unspecified   . Alcohol use 03/16/2007    patient risk factors  . Esophageal reflux   . Tobacco use disorder 03/16/2007    patient risk factors  . 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 Cervicalgia 09/17/2007  . Diplopia 12/21/2007  . Other abnormal glucose 04/08/2008  . Personal history of colonic polyps   . Other diseases of lung, not elsewhere classified 01/23/2009    s/p Fleming/pulmonology  consult; intolerant  to Advair  . Gastritis 05/13/2009  . Peripheral vascular disease, unspecified 12/24/2009  . Unspecified hypothyroidism 01/07/2010  . NSIP (nonspecific interstitial pneumonia)     on azathioprine  . Hyperthyroidism     graves disease  . COPD (chronic obstructive pulmonary disease)   . Shortness of breath   . Stroke 2005    Past Surgical History  Procedure Laterality Date  . Eye surgery  2010  . 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 Admission 11/2011      altered mental status/confusion with syncope.  CT head negative, MRI brain negative, EEG: diffuse slowing but no  seizure acitivity.  Labs negative.  [facility].  Marland Kitchen Neuropsychiatric evaluation  04/2012    poor short-term memory, poor recall, delayed reaction time; permanently disabled.      Family History  Problem Relation Age of Onset  . Heart disease Father     cad, chf  . Cancer Father   . Heart disease Brother     open heart surgery  . Diabetes Brother   . Cancer Brother   . Heart disease Mother     cad  . Diabetes Mother   . Stroke Mother    Social History:  reports that he has been smoking.  He does not have any smokeless tobacco history on file. He reports that he does not drink alcohol or use illicit drugs.  Allergies:  Allergies  Allergen Reactions  . Erythromycin Nausea And Vomiting  . Iodinated Diagnostic Agents Shortness Of Breath  .  Advair Diskus [Fluticasone-Salmeterol]     Short of breath  . Darvocet [Propoxyphene N-Acetaminophen]   . Doxycycline Hyclate Swelling    Tongue swelling, upset stomach  . Septra [Sulfamethoxazole-Trimethoprim]     Medications Prior to Admission  Medication Sig Dispense Refill  . atorvastatin (LIPITOR) 20 MG tablet Take 20 mg by mouth daily.      . clotrimazole (LOTRIMIN) 1 % cream Apply 1 application topically 2 (two) times daily.      . diazepam (VALIUM) 5 MG tablet Take 5 mg by mouth daily.      Marland Kitchen morphine (MS CONTIN) 100 MG 12 hr tablet  Take 100 mg by mouth every 8 (eight) hours.      Marland Kitchen nystatin (MYCOSTATIN) 100000 UNIT/ML suspension Take 5 mLs by mouth 3 (three) times daily. For 10 days      . omeprazole (PRILOSEC) 20 MG capsule Take 1 capsule (20 mg total) by mouth daily.  30 capsule  11  . predniSONE (DELTASONE) 5 MG tablet Take 5 mg by mouth as directed. Take 4 tablets twice daily for 3 days, then take 4 tablets once daily for 3 days, then take 3 tablets once daily till seen by Dr. Vella Kohler.      . sertraline (ZOLOFT) 100 MG tablet Take 1.5 tablets (150 mg total) by mouth daily.  45 tablet  5  . Tapentadol HCl (NUCYNTA) 75 MG TABS Take 1 tablet by mouth every 4 (four) hours as needed (Take every 4-6 hours as needed for pain).      Marland Kitchen thyroid (ARMOUR) 90 MG tablet Take 90 mg by mouth daily.      Marland Kitchen tiotropium (SPIRIVA) 18 MCG inhalation capsule Place 18 mcg into inhaler and inhale daily.      . Vitamin D, Ergocalciferol, (DRISDOL) 50000 UNITS CAPS Take 50,000 Units by mouth daily.        Results for orders placed during the hospital encounter of 05/18/14 (from the past 48 hour(s))  CBC     Status: None   Collection Time    05/18/14 12:23 AM      Result Value Ref Range   WBC 9.8  4.0 - 10.5 K/uL   RBC 5.14  4.22 - 5.81 MIL/uL   Hemoglobin 15.3  13.0 - 17.0 g/dL   HCT 42.9  39.0 - 52.0 %   MCV 83.5  78.0 - 100.0 fL   MCH 29.8  26.0 - 34.0 pg   MCHC 35.7  30.0 - 36.0 g/dL   RDW 12.9  11.5 - 15.5 %   Platelets 338  150 - 400 K/uL  COMPREHENSIVE METABOLIC PANEL     Status: Abnormal   Collection Time    05/18/14 12:23 AM      Result Value Ref Range   Sodium 136 (*) 137 - 147 mEq/L   Potassium 4.1  3.7 - 5.3 mEq/L   Chloride 94 (*) 96 - 112 mEq/L   CO2 29  19 - 32 mEq/L   Glucose, Bld 147 (*) 70 - 99 mg/dL   BUN 14  6 - 23 mg/dL   Creatinine, Ser 0.97  0.50 - 1.35 mg/dL   Calcium 9.3  8.4 - 10.5 mg/dL   Total Protein 7.5  6.0 - 8.3 g/dL   Albumin 3.6  3.5 - 5.2 g/dL   AST 19  0 - 37 U/L   ALT 29  0 - 53 U/L    Alkaline Phosphatase 89  39 - 117 U/L   Total  Bilirubin 0.8  0.3 - 1.2 mg/dL   GFR calc non Af Amer >90  >90 mL/min   GFR calc Af Amer >90  >90 mL/min   Comment: (NOTE)     The eGFR has been calculated using the CKD EPI equation.     This calculation has not been validated in all clinical situations.     eGFR's persistently <90 mL/min signify possible Chronic Kidney     Disease.   Anion gap 13  5 - 15  TROPONIN I     Status: None   Collection Time    05/18/14  1:18 AM      Result Value Ref Range   Troponin I <0.30  <0.30 ng/mL   Comment:            Due to the release kinetics of cTnI,     a negative result within the first hours     of the onset of symptoms does not rule out     myocardial infarction with certainty.     If myocardial infarction is still suspected,     repeat the test at appropriate intervals.  TSH     Status: None   Collection Time    05/18/14  1:18 AM      Result Value Ref Range   TSH 1.440  0.350 - 4.500 uIU/mL  I-STAT CG4 LACTIC ACID, ED     Status: None   Collection Time    05/18/14  1:25 AM      Result Value Ref Range   Lactic Acid, Venous 0.71  0.5 - 2.2 mmol/L  I-STAT ARTERIAL BLOOD GAS, ED     Status: Abnormal   Collection Time    05/18/14  1:26 AM      Result Value Ref Range   pH, Arterial 7.342 (*) 7.350 - 7.450   pCO2 arterial 52.8 (*) 35.0 - 45.0 mmHg   pO2, Arterial 70.0 (*) 80.0 - 100.0 mmHg   Bicarbonate 28.6 (*) 20.0 - 24.0 mEq/L   TCO2 30  0 - 100 mmol/L   O2 Saturation 92.0     Acid-Base Excess 2.0  0.0 - 2.0 mmol/L   Patient temperature 98.6 F     Collection site RADIAL, ALLEN'S TEST ACCEPTABLE     Drawn by Operator     Sample type ARTERIAL     Dg Chest 1 View  05/18/2014   CLINICAL DATA:  Altered mental status.  EXAM: CHEST - 1 VIEW  COMPARISON:  05/12/2014  FINDINGS: Mild cardiac enlargement. Pulmonary vascularity is normal. Diffuse interstitial pattern may represent edema or interstitial pneumonitis. Improving since previous  study. Ventricular peritoneal shunt tubing noted along the left chest. No blunting of costophrenic angles. No pneumothorax.  IMPRESSION: Improving interstitial infiltration in the lungs.   Electronically Signed   By: Lucienne Capers M.D.   On: 05/18/2014 01:54   Dg Abd 1 View  05/18/2014   CLINICAL DATA:  Altered mental status.  EXAM: ABDOMEN - 1 VIEW  COMPARISON:  05/12/2014  FINDINGS: The bowel gas pattern is normal. No radio-opaque calculi or other significant radiographic abnormality are seen. Ventricular peritoneal shunt tubing demonstrated in the mid abdomen.  IMPRESSION: Nonobstructive bowel gas pattern.   Electronically Signed   By: Lucienne Capers M.D.   On: 05/18/2014 01:53   Ct Head Wo Contrast  05/18/2014   CLINICAL DATA:  New altered mental status since 11 a.m.  EXAM: CT HEAD WITHOUT CONTRAST  TECHNIQUE: Contiguous axial images were  obtained from the base of the skull through the vertex without intravenous contrast.  COMPARISON:  05/14/2014  FINDINGS: There is no evidence of mass effect, midline shift or extra-axial fluid collections. There is no evidence of a space-occupying lesion or intracranial hemorrhage. There is no evidence of a cortical-based area of acute infarction. There is mild generalized cerebral cortical atrophy. There is periventricular white matter low attenuation likely secondary to microangiopathy.  Left frontal VP shunt catheter with the tip terminating in the right frontal horn of the lateral ventricle. There is relatively increased periventricular white matter low attenuation compared with the prior exam. The ventricles are mildly dilated compared with 05/14/2014. The basal cisterns are patent.  Visualized portions of the orbits are unremarkable. The visualized portions of the paranasal sinuses and mastoid air cells are unremarkable.  The osseous structures are unremarkable.  IMPRESSION: 1. Left frontal VP shunt catheter with the tip terminating in the right frontal horn of  the lateral ventricle. There is developing hydrocephalus concerning for shunt dysfunction. There is relatively increased periventricular white matter low attenuation compared with the prior exam as can be seen with transependymal flow of CSF.   Electronically Signed   By: Kathreen Devoid   On: 05/18/2014 01:48    Review of Systems  Constitutional: Negative.   HENT: Negative.   Eyes: Negative.   Respiratory:       Chronic shortness of breath secondary to pulmonary fibrosis  Cardiovascular: Negative.   Gastrointestinal: Negative.   Genitourinary: Negative.   Musculoskeletal: Negative.   Neurological:       Decreased level of consciousness noted over the past 24 hours  Psychiatric/Behavioral: Negative.     Blood pressure 111/75, pulse 57, temperature 97.5 F (36.4 C), temperature source Oral, resp. rate 18, height _0  (1.753 m), weight 75.751 kg (167 lb), SpO2 95.00%. Physical Exam  Constitutional: He appears well-developed and well-nourished.  HENT:  Head: Normocephalic and atraumatic.  Eyes: Conjunctivae are normal. Pupils are equal, round, and reactive to light.  Neck: Normal range of motion. Neck supple.  Musculoskeletal: Normal range of motion.  Neurological:  Severely lethargic individual who arouses only to painful stimuli. Pupils are 3 mm equal and brisk. The extraocular movements are difficult to assess but he has roving eye movements. The neck is supple. His abdomen is soft. No masses can be felt along the path of the shunt. The shunt itself pumps and refills briskly.     Assessment/Plan Ventricular peritoneal shunt malfunction  Will tap the shunt at this time  CSF. Reprogramming also be performed. Depending on results of the fluid I discussed this surgeons with the wife that sometimes the best option is to replace the current shunt with a completely new system. We'll see her he does after the shunt tap.  Tyee Vandevoorde J 05/18/2014, 7:31 AM

## 2014-05-18 NOTE — ED Provider Notes (Signed)
CSN: [id number]     Arrival date & time 05/18/14  0011 History   First MD Initiated Contact with Patient 05/18/14 0048     Chief Complaint  Patient presents with  . Altered Mental Status     (Consider location/radiation/quality/duration/timing/severity/associated sxs/prior Treatment) HPI 58 year old male presents to the emergency department from home via EMS with complaint of altered mental status.  Patient unable to give any history due to his altered male status.  He is somnolent but will arouse with verbal stimuli and give short one-word answers.  He will follow commands.  Wife reports that patient recently discharged from the hospital 2 to altered mental status 22 VP shunt malfunction patient was discharged home on Thursday after Dr. Cyndy Freeze reprogrammed his shunt.  She reports that Dr. Cyndy Freeze had planned to revise his shunt surgically, but on the morning of the operation, patient was clinically much improved.  He went home on Thursday, patient was well Thursday Friday and this morning.  She reports he went down for a nap around 11 AM.  Patient slept most of day.  She was unable to wake him after that point and got concerned.  Patient also has history of COPD.  He was admitted to [facility] with double pneumonia and is on a prednisone taper.  He also has history of chronic pain, and hypothyroidism.  Wife denies any changes in his medication, no fever.  He has been on chronic oxygen no change.  He does not have access to pain medications, and she does not feel that he has taken more than he normally does.  She reports that the patient is acting as though his shunt is malfunctioning.   Past Medical History  Diagnosis Date  . Altered mental status   . Pneumonia, organism unspecified   . Memory loss   . Osteoporosis, unspecified     bone density per Morivati in 2012  . Iodine hypothyroidism   . Anxiety state, unspecified   . Pure hypercholesterolemia   . Hematuria 03/16/2007  . Allergic  rhinitis, cause unspecified   . Alcohol use 03/16/2007    patient risk factors  . Esophageal reflux   . Tobacco use disorder 03/16/2007    patient risk factors  . 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 Cervicalgia 09/17/2007  . Diplopia 12/21/2007  . Other abnormal glucose 04/08/2008  . Personal history of colonic polyps   . Other diseases of lung, not elsewhere classified 01/23/2009    s/p Fleming/pulmonology consult; intolerant  to Advair  . Gastritis 05/13/2009  . Peripheral vascular disease, unspecified 12/24/2009  . Unspecified hypothyroidism 01/07/2010  . NSIP (nonspecific interstitial pneumonia)     on azathioprine  . Hyperthyroidism     graves disease  . COPD (chronic obstructive pulmonary disease)   . Shortness of breath   . Stroke 2005   Past Surgical History  Procedure Laterality Date  . Eye surgery  2010  . 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 Admission 11/2011      altered mental status/confusion with syncope.  CT head negative, MRI brain negative, EEG: diffuse slowing but no  seizure acitivity.  Labs negative.  [facility].  Marland Kitchen Neuropsychiatric evaluation  04/2012    poor short-term memory, poor recall, delayed reaction time; permanently disabled.     Family History  Problem Relation Age of Onset  . Heart disease Father     cad, chf  . Cancer Father   .  Heart disease Brother     open heart surgery  . Diabetes Brother   . Cancer Brother   . Heart disease Mother     cad  . Diabetes Mother   . Stroke Mother    History  Substance Use Topics  . Smoking status: Current Every Day Smoker -- 1.00 packs/day for 30 years  . Smokeless tobacco: Not on file  . Alcohol Use: No    Review of Systems  Unable to perform ROS: Mental status change      Allergies  Erythromycin; Iodinated diagnostic agents; Advair diskus; Darvocet; Doxycycline hyclate; and Septra  Home Medications   Prior to Admission medications   Medication  Sig Start Date End Date Taking? Authorizing Provider  atorvastatin (LIPITOR) 20 MG tablet Take 20 mg by mouth daily.   Yes Historical Provider, MD  clotrimazole (LOTRIMIN) 1 % cream Apply 1 application topically 2 (two) times daily.   Yes Historical Provider, MD  diazepam (VALIUM) 5 MG tablet Take 5 mg by mouth daily. 04/25/14  Yes Historical Provider, MD  morphine (MS CONTIN) 100 MG 12 hr tablet Take 100 mg by mouth every 8 (eight) hours.   Yes Historical Provider, MD  nystatin (MYCOSTATIN) 100000 UNIT/ML suspension Take 5 mLs by mouth 3 (three) times daily. For 10 days 05/16/14 05/25/14 Yes Historical Provider, MD  omeprazole (PRILOSEC) 20 MG capsule Take 1 capsule (20 mg total) by mouth daily. 02/26/14  Yes [name], MD  predniSONE (DELTASONE) 5 MG tablet Take 5 mg by mouth as directed. Take 4 tablets twice daily for 3 days, then take 4 tablets once daily for 3 days, then take 3 tablets once daily till seen by Dr. Vella Kohler. 05/15/14  Yes Bonnielee Haff, MD  sertraline (ZOLOFT) 100 MG tablet Take 1.5 tablets (150 mg total) by mouth daily. 02/26/14  Yes [name], MD  Tapentadol HCl (NUCYNTA) 75 MG TABS Take 1 tablet by mouth every 4 (four) hours as needed (Take every 4-6 hours as needed for pain).   Yes Historical Provider, MD  thyroid (ARMOUR) 90 MG tablet Take 90 mg by mouth daily.   Yes Historical Provider, MD  tiotropium (SPIRIVA) 18 MCG inhalation capsule Place 18 mcg into inhaler and inhale daily.   Yes Historical Provider, MD  Vitamin D, Ergocalciferol, (DRISDOL) 50000 UNITS CAPS Take 50,000 Units by mouth daily.   Yes Historical Provider, MD   BP 121/85  Pulse 62  Temp(Src) 97.5 F (36.4 C) (Oral)  Resp 13  Ht 5\' 9"  (1.753 m)  Wt 167 lb (75.751 kg)  BMI 24.65 kg/m2  SpO2 99% Physical Exam  Nursing note and vitals reviewed. Constitutional: He appears well-developed and well-nourished. No distress.  HENT:  Nose: Nose normal.  Mouth/Throat: Oropharynx is clear and moist.  VP  shunt is noted to left scalp.  No fluid collection swelling redness or tenderness to the area  Eyes: Conjunctivae and EOM are normal.  Left pupil is dilated to 6 mm and is unreactive.  Wife reports this is his baseline.  Neck: Normal range of motion. Neck supple. No JVD present. No tracheal deviation present. No thyromegaly present.  Cardiovascular: Normal rate, regular rhythm, normal heart sounds and intact distal pulses.  Exam reveals no gallop and no friction rub.   No murmur heard. Pulmonary/Chest: Effort normal and breath sounds normal. No stridor. No respiratory distress. He has no wheezes. He has no rales. He exhibits no tenderness.  Abdominal: Soft. Bowel sounds are normal. He exhibits  no distension and no mass. There is no tenderness. There is no rebound and no guarding.  Musculoskeletal: Normal range of motion. He exhibits no edema and no tenderness.  Lymphadenopathy:    He has no cervical adenopathy.  Neurological: He displays normal reflexes. He exhibits normal muscle tone. Coordination normal.  Patient is somnolent.  Will rouse to voice.  He has delayed answering to questions and needs to be stimulated frequently to answer.  He has correct answers to questions.  Patient moves all extremities, but has slowed response to commands.  Skin: Skin is warm and dry. No rash noted. No erythema. No pallor.    ED Course  Procedures (including critical care time) Labs Review Labs Reviewed  COMPREHENSIVE METABOLIC PANEL - Abnormal; Notable for the following:    Sodium 136 (*)    Chloride 94 (*)    Glucose, Bld 147 (*)    All other components within normal limits  I-STAT ARTERIAL BLOOD GAS, ED - Abnormal; Notable for the following:    pH, Arterial 7.342 (*)    pCO2 arterial 52.8 (*)    pO2, Arterial 70.0 (*)    Bicarbonate 28.6 (*)    All other components within normal limits  CBC  TROPONIN I  TSH  URINALYSIS, ROUTINE W REFLEX MICROSCOPIC  T4  I-STAT CG4 LACTIC ACID, ED     Imaging Review Dg Chest 1 View  05/18/2014   CLINICAL DATA:  Altered mental status.  EXAM: CHEST - 1 VIEW  COMPARISON:  05/12/2014  FINDINGS: Mild cardiac enlargement. Pulmonary vascularity is normal. Diffuse interstitial pattern may represent edema or interstitial pneumonitis. Improving since previous study. Ventricular peritoneal shunt tubing noted along the left chest. No blunting of costophrenic angles. No pneumothorax.  IMPRESSION: Improving interstitial infiltration in the lungs.   Electronically Signed   By: Lucienne Capers M.D.   On: 05/18/2014 01:54   Dg Abd 1 View  05/18/2014   CLINICAL DATA:  Altered mental status.  EXAM: ABDOMEN - 1 VIEW  COMPARISON:  05/12/2014  FINDINGS: The bowel gas pattern is normal. No radio-opaque calculi or other significant radiographic abnormality are seen. Ventricular peritoneal shunt tubing demonstrated in the mid abdomen.  IMPRESSION: Nonobstructive bowel gas pattern.   Electronically Signed   By: Lucienne Capers M.D.   On: 05/18/2014 01:53   Ct Head Wo Contrast  05/18/2014   CLINICAL DATA:  New altered mental status since 11 a.m.  EXAM: CT HEAD WITHOUT CONTRAST  TECHNIQUE: Contiguous axial images were obtained from the base of the skull through the vertex without intravenous contrast.  COMPARISON:  05/14/2014  FINDINGS: There is no evidence of mass effect, midline shift or extra-axial fluid collections. There is no evidence of a space-occupying lesion or intracranial hemorrhage. There is no evidence of a cortical-based area of acute infarction. There is mild generalized cerebral cortical atrophy. There is periventricular white matter low attenuation likely secondary to microangiopathy.  Left frontal VP shunt catheter with the tip terminating in the right frontal horn of the lateral ventricle. There is relatively increased periventricular white matter low attenuation compared with the prior exam. The ventricles are mildly dilated compared with 05/14/2014. The  basal cisterns are patent.  Visualized portions of the orbits are unremarkable. The visualized portions of the paranasal sinuses and mastoid air cells are unremarkable.  The osseous structures are unremarkable.  IMPRESSION: 1. Left frontal VP shunt catheter with the tip terminating in the right frontal horn of the lateral ventricle. There is developing hydrocephalus  concerning for shunt dysfunction. There is relatively increased periventricular white matter low attenuation compared with the prior exam as can be seen with transependymal flow of CSF.   Electronically Signed   By: Kathreen Devoid   On: 05/18/2014 01:48     EKG Interpretation   Date/Time:  Sunday May 18 2014 00:22:14 EDT Ventricular Rate:  59 PR Interval:  140 QRS Duration: 81 QT Interval:  395 QTC Calculation: 391 R Axis:   -39 Text Interpretation:  Sinus rhythm Left axis deviation Consider anterior  infarct Borderline T abnormalities, inferior leads t wave changes new from  prior Confirmed by Ceciley Buist  MD, Loralyn Rachel ([id number]) on 05/18/2014 6:28:22 AM      MDM   Final diagnoses:  Obstructed VP shunt, subsequent encounter  Altered mental status, unspecified altered mental status type    58 year old male with altered mental status since 68 AM with history of VP shunt malfunction recently.  He also has history of COPD and is oxygen dependent.  We'll check labs, CT head and shunt series.  Case d/w Dr Ellene Route who will look at chart/images.  Dr Ellene Route to admit, wishes holding orders written.  Family updated on findings and plan.  Kalman Drape, MD 05/18/14 [phone]

## 2014-05-19 NOTE — Progress Notes (Signed)
UR complete.  Baker Moronta RN, MSN 

## 2014-05-19 NOTE — Progress Notes (Signed)
Patient ID: Kirk Robinson, male   DOB: 1955/11/12, 58 y.o.   MRN: [id number] Patient has been n.p.o. CSF appears to be clean with normal glucose and low protein and no white cells. Neurologic status has not improved and patient remained significantly lethargic and obtunded I discussed and reviewed Dr. Hewitt Shorts notes His plan was to do a distal exploration intra-abdominally with the general surgeons to see if the distal portion of the catheter was clogged and needed to be repositioned I believe that this is a reasonable approach. I discussed the situation with Mrs. Asche. Bowel schedule surgical exploration with general surgery to see if we can relocate the distal catheter. This is successful that is all that may be required. If not we'll plan a new shunt insertion on the right side into the ventriculoperitoneal cavity. I'll discuss the situation with general surgery.

## 2014-05-19 NOTE — Consult Note (Signed)
Reason for Consult:poorly functioning vp shunt Referring Physician: Dr. Amaryllis Dyke Robinson is an 58 y.o. male.  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 He was recently admitted to the hospital with altered level of consciousness. He has been found to have a poorly functioning VP shunt. Cultures have been negative.  Past Medical History  Diagnosis Date  . Altered mental status   . Pneumonia, organism unspecified   . Memory loss   . Osteoporosis, unspecified     bone density per [facility] in 2012  . Iodine hypothyroidism   . Anxiety state, unspecified   . Pure hypercholesterolemia   . Hematuria 03/16/2007  . Allergic rhinitis, cause unspecified   . Alcohol use 03/16/2007    patient risk factors  . Esophageal reflux   . Tobacco use disorder 03/16/2007    patient risk factors  . 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 Cervicalgia 09/17/2007  . Diplopia 12/21/2007  . Other abnormal glucose 04/08/2008  . Personal history of colonic polyps   . Other diseases of lung, not elsewhere classified 01/23/2009    s/p Fleming/pulmonology consult; intolerant  to Advair  . Gastritis 05/13/2009  . Peripheral vascular disease, unspecified 12/24/2009  . Unspecified hypothyroidism 01/07/2010  . NSIP (nonspecific interstitial pneumonia)     on azathioprine  . Hyperthyroidism     graves disease  . COPD (chronic obstructive pulmonary disease)   . Shortness of breath   . Stroke 2005    Past Surgical History  Procedure Laterality Date  . Eye surgery  2010  . 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 Admission 11/2011      altered mental status/confusion with syncope.  CT head negative, MRI brain negative, EEG: diffuse slowing but no  seizure acitivity.  Labs negative.  [facility]  [facility]  04/2012    poor short-term memory, poor recall, delayed reaction time; permanently disabled.       Family History  Problem Relation Age of Onset  . Heart disease Father     cad, chf  . Cancer Father   . Heart disease Brother     open heart surgery  . Diabetes Brother   . Cancer Brother   . Heart disease Mother     cad  . Diabetes Mother   . Stroke Mother     Social History:  reports that he has been smoking.  He does not have any smokeless tobacco history on file. He reports that he does not drink alcohol or use illicit drugs.  Allergies:  Allergies  Allergen Reactions  . Erythromycin Nausea And Vomiting  . Iodinated Diagnostic Agents Shortness Of Breath  . Advair Diskus [Fluticasone-Salmeterol]     Short of breath  . Darvocet [Propoxyphene N-Acetaminophen]   . Doxycycline Hyclate Swelling    Tongue swelling, upset stomach  . Septra [Sulfamethoxazole-Trimethoprim]     Medications: I have reviewed the patient's current medications.  Results for orders placed during the hospital encounter of 05/18/14 (from the past 48 hour(s))  CBC     Status: None   Collection Time    05/18/14 12:23 AM      Result Value Ref Range   WBC 9.8  4.0 - 10.5 K/uL   RBC 5.14  4.22 - 5.81 MIL/uL   Hemoglobin 15.3  13.0 - 17.0 g/dL   HCT 42.9  39.0 - 52.0 %  MCV 83.5  78.0 - 100.0 fL   MCH 29.8  26.0 - 34.0 pg   MCHC 35.7  30.0 - 36.0 g/dL   RDW 12.9  11.5 - 15.5 %   Platelets 338  150 - 400 K/uL  COMPREHENSIVE METABOLIC PANEL     Status: Abnormal   Collection Time    05/18/14 12:23 AM      Result Value Ref Range   Sodium 136 (*) 137 - 147 mEq/L   Potassium 4.1  3.7 - 5.3 mEq/L   Chloride 94 (*) 96 - 112 mEq/L   CO2 29  19 - 32 mEq/L   Glucose, Bld 147 (*) 70 - 99 mg/dL   BUN 14  6 - 23 mg/dL   Creatinine, Ser 0.97  0.50 - 1.35 mg/dL   Calcium 9.3  8.4 - 10.5 mg/dL   Total Protein 7.5  6.0 - 8.3 g/dL   Albumin 3.6  3.5 - 5.2 g/dL   AST 19  0 - 37 U/L   ALT 29  0 - 53 U/L   Alkaline Phosphatase 89  39 - 117 U/L   Total Bilirubin 0.8  0.3 - 1.2 mg/dL   GFR calc non Af Amer  >90  >90 mL/min   GFR calc Af Amer >90  >90 mL/min   Comment: (NOTE)     The eGFR has been calculated using the CKD EPI equation.     This calculation has not been validated in all clinical situations.     eGFR's persistently <90 mL/min signify possible Chronic Kidney     Disease.   Anion gap 13  5 - 15  TROPONIN I     Status: None   Collection Time    05/18/14  1:18 AM      Result Value Ref Range   Troponin I <0.30  <0.30 ng/mL   Comment:            Due to the release kinetics of cTnI,     a negative result within the first hours     of the onset of symptoms does not rule out     myocardial infarction with certainty.     If myocardial infarction is still suspected,     repeat the test at appropriate intervals.  TSH     Status: None   Collection Time    05/18/14  1:18 AM      Result Value Ref Range   TSH 1.440  0.350 - 4.500 uIU/mL  T4     Status: Abnormal   Collection Time    05/18/14  1:18 AM      Result Value Ref Range   T4, Total 4.6 (*) 4.5 - 12.0 ug/dL   Comment: ** Please note change in reference range(s). **     Performed at [facility], ED     Status: None   Collection Time    05/18/14  1:25 AM      Result Value Ref Range   Lactic Acid, Venous 0.71  0.5 - 2.2 mmol/L  I-STAT ARTERIAL BLOOD GAS, ED     Status: Abnormal   Collection Time    05/18/14  1:26 AM      Result Value Ref Range   pH, Arterial 7.342 (*) 7.350 - 7.450   pCO2 arterial 52.8 (*) 35.0 - 45.0 mmHg   pO2, Arterial 70.0 (*) 80.0 - 100.0 mmHg   Bicarbonate 28.6 (*) 20.0 - 24.0  mEq/L   TCO2 30  0 - 100 mmol/L   O2 Saturation 92.0     Acid-Base Excess 2.0  0.0 - 2.0 mmol/L   Patient temperature 98.6 F     Collection site RADIAL, ALLEN'S TEST ACCEPTABLE     Drawn by Operator     Sample type ARTERIAL    PROTEIN AND GLUCOSE, CSF     Status: Abnormal   Collection Time    05/18/14  8:09 AM      Result Value Ref Range   Glucose, CSF 87 (*) 43 - 76 mg/dL   Total   Protein, CSF 5 (*) 15 - 45 mg/dL  BODY FLUID CULTURE     Status: None   Collection Time    05/18/14  8:09 AM      Result Value Ref Range   Specimen Description CSF     Special Requests Normal     Gram Stain       Value: CYTOSPIN NO WBC SEEN     NO ORGANISMS SEEN     Performed at [facility]     Performed at [facility]   Culture       Value: NO GROWTH 1 DAY     Performed at [facility]   Report Status PENDING    CSF CELL COUNT WITH DIFFERENTIAL     Status: None   Collection Time    05/18/14  8:09 AM      Result Value Ref Range   Tube # 4     Color, CSF COLORLESS  COLORLESS   Appearance, CSF CLEAR  CLEAR   Supernatant NOT INDICATED     RBC Count, CSF 0  0 /cu mm   WBC, CSF 1  0 - 5 /cu mm   Lymphs, CSF RARE  40 - 80 %   Monocyte-Macrophage-Spinal Fluid RARE  15 - 45 %   Other Cells, CSF TOO FEW TO COUNT, SMEAR AVAILABLE FOR REVIEW    GRAM STAIN     Status: None   Collection Time    05/18/14  8:09 AM      Result Value Ref Range   Specimen Description CSF     Special Requests NONE     Gram Stain       Value: NO WBC SEEN     NO ORGANISMS SEEN     CYTOSPIN SLIDE   Report Status 05/18/2014 FINAL    CBC     Status: None   Collection Time    05/18/14  8:47 AM      Result Value Ref Range   WBC 8.3  4.0 - 10.5 K/uL   RBC 4.65  4.22 - 5.81 MIL/uL   Hemoglobin 13.9  13.0 - 17.0 g/dL   HCT 39.9  39.0 - 52.0 %   MCV 85.8  78.0 - 100.0 fL   MCH 29.9  26.0 - 34.0 pg   MCHC 34.8  30.0 - 36.0 g/dL   RDW 13.0  11.5 - 15.5 %   Platelets 266  150 - 400 K/uL  COMPREHENSIVE METABOLIC PANEL     Status: Abnormal   Collection Time    05/18/14  8:47 AM      Result Value Ref Range   Sodium 136 (*) 137 - 147 mEq/L   Potassium 3.4 (*) 3.7 - 5.3 mEq/L   Comment: DELTA CHECK NOTED   Chloride 94 (*) 96 - 112 mEq/L   CO2 32  19 -  32 mEq/L   Glucose, Bld 112 (*) 70 - 99 mg/dL   BUN 13  6 - 23 mg/dL   Creatinine, Ser 0.87  0.50 - 1.35 mg/dL   Calcium 8.7  8.4 -  10.5 mg/dL   Total Protein 6.6  6.0 - 8.3 g/dL   Albumin 3.2 (*) 3.5 - 5.2 g/dL   AST 18  0 - 37 U/L   ALT 24  0 - 53 U/L   Alkaline Phosphatase 73  39 - 117 U/L   Total Bilirubin 0.6  0.3 - 1.2 mg/dL   GFR calc non Af Amer >90  >90 mL/min   GFR calc Af Amer >90  >90 mL/min   Comment: (NOTE)     The eGFR has been calculated using the CKD EPI equation.     This calculation has not been validated in all clinical situations.     eGFR's persistently <90 mL/min signify possible Chronic Kidney     Disease.   Anion gap 10  5 - 15    Dg Chest 1 View  05/18/2014   CLINICAL DATA:  Altered mental status.  EXAM: CHEST - 1 VIEW  COMPARISON:  05/12/2014  FINDINGS: Mild cardiac enlargement. Pulmonary vascularity is normal. Diffuse interstitial pattern may represent edema or interstitial pneumonitis. Improving since previous study. Ventricular peritoneal shunt tubing noted along the left chest. No blunting of costophrenic angles. No pneumothorax.  IMPRESSION: Improving interstitial infiltration in the lungs.   Electronically Signed   By: Lucienne Capers M.D.   On: 05/18/2014 01:54   Dg Abd 1 View  05/18/2014   CLINICAL DATA:  Altered mental status.  EXAM: ABDOMEN - 1 VIEW  COMPARISON:  05/12/2014  FINDINGS: The bowel gas pattern is normal. No radio-opaque calculi or other significant radiographic abnormality are seen. Ventricular peritoneal shunt tubing demonstrated in the mid abdomen.  IMPRESSION: Nonobstructive bowel gas pattern.   Electronically Signed   By: Lucienne Capers M.D.   On: 05/18/2014 01:53   Ct Head Wo Contrast  05/18/2014   CLINICAL DATA:  New altered mental status since 11 a.m.  EXAM: CT HEAD WITHOUT CONTRAST  TECHNIQUE: Contiguous axial images were obtained from the base of the skull through the vertex without intravenous contrast.  COMPARISON:  05/14/2014  FINDINGS: There is no evidence of mass effect, midline shift or extra-axial fluid collections. There is no evidence of a  space-occupying lesion or intracranial hemorrhage. There is no evidence of a cortical-based area of acute infarction. There is mild generalized cerebral cortical atrophy. There is periventricular white matter low attenuation likely secondary to microangiopathy.  Left frontal VP shunt catheter with the tip terminating in the right frontal horn of the lateral ventricle. There is relatively increased periventricular white matter low attenuation compared with the prior exam. The ventricles are mildly dilated compared with 05/14/2014. The basal cisterns are patent.  Visualized portions of the orbits are unremarkable. The visualized portions of the paranasal sinuses and mastoid air cells are unremarkable.  The osseous structures are unremarkable.  IMPRESSION: 1. Left frontal VP shunt catheter with the tip terminating in the right frontal horn of the lateral ventricle. There is developing hydrocephalus concerning for shunt dysfunction. There is relatively increased periventricular white matter low attenuation compared with the prior exam as can be seen with transependymal flow of CSF.   Electronically Signed   By: Kathreen Devoid   On: 05/18/2014 01:48    Review of Systems  Constitutional: Negative.   HENT: Negative.  Eyes: Negative.   Respiratory: Negative.   Cardiovascular: Negative.   Gastrointestinal: Negative.   Genitourinary: Negative.   Musculoskeletal: Negative.   Skin: Negative.   Neurological: Negative for loss of consciousness.  Endo/Heme/Allergies: Negative.   Psychiatric/Behavioral: Negative.    Blood pressure 105/74, pulse 60, temperature 98.6 F (37 C), temperature source Axillary, resp. rate 18, height '5\' 9"'  (1.753 m), weight 167 lb (75.751 kg), SpO2 97.00%. Physical Exam  Constitutional: He appears well-developed and well-nourished.  HENT:  Head: Normocephalic and atraumatic.  Eyes: Conjunctivae and EOM are normal. Pupils are equal, round, and reactive to light.  Neck: Normal range of  motion. Neck supple.  Cardiovascular: Normal rate, regular rhythm and normal heart sounds.   Respiratory: Effort normal and breath sounds normal.  GI: Soft. Bowel sounds are normal.  Musculoskeletal: Normal range of motion.  Neurological:  Decreased level of consciousness. Not communicative. Family says he was previously alert and oriented  Skin: Skin is warm and dry.  Psychiatric:  Decreased loc    Assessment/Plan: The patient has a poorly functioning VP shunt. The neurosurgeons have asked that we consider trying to adjust the placement of the shunt laparoscopically to see if we can get it to work better. I will discuss this with the primary team in the morning and try to coordinate with the neurosurgeons.  TOTH III,PAUL S 05/19/2014, 9:12 PM

## 2014-05-20 ENCOUNTER — Encounter (HOSPITAL_COMMUNITY): Payer: Self-pay | Admitting: Certified Registered"

## 2014-05-20 ENCOUNTER — Encounter (HOSPITAL_COMMUNITY)
Admission: EM | Disposition: A | Discharge status: Home / Self Care | Payer: Self-pay | Source: Home / Self Care | Attending: Neurological Surgery

## 2014-05-20 ENCOUNTER — Encounter (HOSPITAL_COMMUNITY): Payer: 59 | Admitting: Anesthesiology

## 2014-05-20 ENCOUNTER — Inpatient Hospital Stay (HOSPITAL_COMMUNITY): Payer: 59 | Admitting: Anesthesiology

## 2014-05-20 HISTORY — PX: LAPAROSCOPIC REVISION VENTRICULAR-PERITONEAL (V-P) SHUNT: SHX5924

## 2014-05-20 LAB — CBC
HCT: 37.1 % — ABNORMAL LOW (ref 39.0–52.0)
Hemoglobin: 12.8 g/dL — ABNORMAL LOW (ref 13.0–17.0)
MCH: 29.3 pg (ref 26.0–34.0)
MCHC: 34.5 g/dL (ref 30.0–36.0)
MCV: 84.9 fL (ref 78.0–100.0)
Platelets: 239 10*3/uL (ref 150–400)
RBC: 4.37 MIL/uL (ref 4.22–5.81)
RDW: 13 % (ref 11.5–15.5)
WBC: 9.1 10*3/uL (ref 4.0–10.5)

## 2014-05-20 LAB — CREATININE, SERUM
Creatinine, Ser: 0.76 mg/dL (ref 0.50–1.35)
GFR calc Af Amer: >90 mL/min (ref >90)
GFR calc non Af Amer: >90 mL/min (ref >90)

## 2014-05-20 SURGERY — REVISION, SHUNT, VENTRICULOPERITONEAL
Anesthesia: General

## 2014-05-20 SURGERY — Surgical Case
Anesthesia: General | Laterality: Right

## 2014-05-20 MED ORDER — DOCUSATE SODIUM 100 MG PO CAPS
100.0000 mg | ORAL_CAPSULE | Freq: Two times a day (BID) | ORAL | Status: DC
  Administered 2014-05-21 – 2014-05-24 (×7): 100 mg via ORAL
  Filled 2014-05-20 (×7): qty 1

## 2014-05-20 MED ORDER — LACTATED RINGERS IV SOLN
INTRAVENOUS | Status: DC | PRN
Start: 2014-05-20 — End: 2014-05-20
  Administered 2014-05-20 (×2): via INTRAVENOUS

## 2014-05-20 MED ORDER — PROMETHAZINE HCL 25 MG/ML IJ SOLN: 6.2500 mg | INTRAMUSCULAR | Status: DC | PRN

## 2014-05-20 MED ORDER — ONDANSETRON HCL 4 MG PO TABS
4.0000 mg | ORAL_TABLET | ORAL | Status: DC | PRN
  Filled 2014-05-20: qty 1

## 2014-05-20 MED ORDER — FENTANYL CITRATE 0.05 MG/ML IJ SOLN
INTRAMUSCULAR | Status: DC | PRN
  Administered 2014-05-20 (×2): 50 ug via INTRAVENOUS

## 2014-05-20 MED ORDER — PANTOPRAZOLE SODIUM 40 MG IV SOLR: 40.0000 mg | Freq: Every day | INTRAVENOUS | Status: DC

## 2014-05-20 MED ORDER — CEFAZOLIN SODIUM-DEXTROSE 2-3 GM-% IV SOLR
INTRAVENOUS | Status: AC
  Filled 2014-05-20: qty 50

## 2014-05-20 MED ORDER — PROPOFOL 10 MG/ML IV BOLUS
INTRAVENOUS | Status: AC
Start: 2014-05-20 — End: 2014-05-20
  Filled 2014-05-20: qty 20

## 2014-05-20 MED ORDER — LIDOCAINE HCL (CARDIAC) 20 MG/ML IV SOLN
INTRAVENOUS | Status: AC
  Filled 2014-05-20: qty 5

## 2014-05-20 MED ORDER — ONDANSETRON HCL 4 MG/2ML IJ SOLN
INTRAMUSCULAR | Status: DC | PRN
  Administered 2014-05-20: 4 mg via INTRAVENOUS

## 2014-05-20 MED ORDER — CEFAZOLIN SODIUM-DEXTROSE 2-3 GM-% IV SOLR
2.0000 g | Freq: Once | INTRAVENOUS | Status: AC
  Administered 2014-05-20: 2 g via INTRAVENOUS
  Filled 2014-05-20: qty 50

## 2014-05-20 MED ORDER — FENTANYL CITRATE 0.05 MG/ML IJ SOLN
INTRAMUSCULAR | Status: AC
  Filled 2014-05-20: qty 5

## 2014-05-20 MED ORDER — HEMOSTATIC AGENTS (NO CHARGE) OPTIME: TOPICAL | Status: DC | PRN

## 2014-05-20 MED ORDER — ROCURONIUM BROMIDE 100 MG/10ML IV SOLN
INTRAVENOUS | Status: DC | PRN
  Administered 2014-05-20: 40 mg via INTRAVENOUS

## 2014-05-20 MED ORDER — FENTANYL CITRATE 0.05 MG/ML IJ SOLN
INTRAMUSCULAR | Status: AC
  Filled 2014-05-20: qty 2

## 2014-05-20 MED ORDER — SODIUM CHLORIDE 0.9 % IR SOLN
Status: DC | PRN
  Administered 2014-05-20: 1000 mL

## 2014-05-20 MED ORDER — NEOSTIGMINE METHYLSULFATE 10 MG/10ML IV SOLN
INTRAVENOUS | Status: DC | PRN
  Administered 2014-05-20: 5 mg via INTRAVENOUS

## 2014-05-20 MED ORDER — POLYETHYLENE GLYCOL 3350 17 G PO PACK
17.0000 g | PACK | Freq: Every day | ORAL | Status: DC | PRN
  Administered 2014-05-23: 17 g via ORAL
  Filled 2014-05-20: qty 1

## 2014-05-20 MED ORDER — OXYCODONE HCL 5 MG PO TABS: 5.0000 mg | ORAL_TABLET | Freq: Once | ORAL | Status: DC | PRN

## 2014-05-20 MED ORDER — MIDAZOLAM HCL 2 MG/2ML IJ SOLN: 0.5000 mg | Freq: Once | INTRAMUSCULAR | Status: DC | PRN

## 2014-05-20 MED ORDER — PHENYLEPHRINE HCL 10 MG/ML IJ SOLN
INTRAMUSCULAR | Status: DC | PRN
  Administered 2014-05-20: 120 ug via INTRAVENOUS

## 2014-05-20 MED ORDER — BISACODYL 10 MG RE SUPP: 10.0000 mg | Freq: Every day | RECTAL | Status: DC | PRN

## 2014-05-20 MED ORDER — PHENYLEPHRINE 40 MCG/ML (10ML) SYRINGE FOR IV PUSH (FOR BLOOD PRESSURE SUPPORT)
PREFILLED_SYRINGE | INTRAVENOUS | Status: AC
  Filled 2014-05-20: qty 10

## 2014-05-20 MED ORDER — ENOXAPARIN SODIUM 40 MG/0.4ML ~~LOC~~ SOLN
40.0000 mg | SUBCUTANEOUS | Status: DC
  Administered 2014-05-21 – 2014-05-24 (×4): 40 mg via SUBCUTANEOUS
  Filled 2014-05-20 (×4): qty 0.4

## 2014-05-20 MED ORDER — ONDANSETRON HCL 4 MG/2ML IJ SOLN
4.0000 mg | INTRAMUSCULAR | Status: DC | PRN
Start: 2014-05-20 — End: 2014-05-24

## 2014-05-20 MED ORDER — LIDOCAINE-EPINEPHRINE 1 %-1:100000 IJ SOLN
INTRAMUSCULAR | Status: DC | PRN
  Administered 2014-05-20: 13 mL

## 2014-05-20 MED ORDER — SENNA 8.6 MG PO TABS
1.0000 | ORAL_TABLET | Freq: Two times a day (BID) | ORAL | Status: DC
  Administered 2014-05-21 – 2014-05-24 (×7): 8.6 mg via ORAL
  Filled 2014-05-20 (×7): qty 1

## 2014-05-20 MED ORDER — GLYCOPYRROLATE 0.2 MG/ML IJ SOLN
INTRAMUSCULAR | Status: DC | PRN
  Administered 2014-05-20: .8 mg via INTRAVENOUS

## 2014-05-20 MED ORDER — SODIUM CHLORIDE 0.9 % IR SOLN
Status: DC | PRN
  Administered 2014-05-20: 14:00:00

## 2014-05-20 MED ORDER — BUPIVACAINE-EPINEPHRINE (PF) 0.25% -1:200000 IJ SOLN
INTRAMUSCULAR | Status: AC
  Filled 2014-05-20: qty 30

## 2014-05-20 MED ORDER — HYDROCORTISONE NA SUCCINATE PF 100 MG IJ SOLR
INTRAMUSCULAR | Status: AC
  Filled 2014-05-20: qty 2

## 2014-05-20 MED ORDER — PROMETHAZINE HCL 25 MG PO TABS: 12.5000 mg | ORAL_TABLET | ORAL | Status: DC | PRN

## 2014-05-20 MED ORDER — LIDOCAINE HCL 4 % MT SOLN
OROMUCOSAL | Status: DC | PRN
  Administered 2014-05-20: 4 mL via TOPICAL

## 2014-05-20 MED ORDER — HYDROCORTISONE NA SUCCINATE PF 100 MG IJ SOLR
INTRAMUSCULAR | Status: DC | PRN
  Administered 2014-05-20: 100 mg via INTRAVENOUS

## 2014-05-20 MED ORDER — ROCURONIUM BROMIDE 50 MG/5ML IV SOLN
INTRAVENOUS | Status: AC
  Filled 2014-05-20: qty 1

## 2014-05-20 MED ORDER — OXYCODONE HCL 5 MG/5ML PO SOLN: 5.0000 mg | Freq: Once | ORAL | Status: DC | PRN

## 2014-05-20 MED ORDER — PROPOFOL 10 MG/ML IV BOLUS
INTRAVENOUS | Status: DC | PRN
  Administered 2014-05-20: 100 mg via INTRAVENOUS

## 2014-05-20 MED ORDER — ALBUTEROL SULFATE HFA 108 (90 BASE) MCG/ACT IN AERS
INHALATION_SPRAY | RESPIRATORY_TRACT | Status: AC
  Filled 2014-05-20: qty 6.7

## 2014-05-20 MED ORDER — ARTIFICIAL TEARS OP OINT
TOPICAL_OINTMENT | OPHTHALMIC | Status: AC
  Filled 2014-05-20: qty 3.5

## 2014-05-20 MED ORDER — PHENYLEPHRINE HCL 10 MG/ML IJ SOLN
10.0000 mg | INTRAVENOUS | Status: DC | PRN
  Administered 2014-05-20: 20 ug/min via INTRAVENOUS

## 2014-05-20 MED ORDER — OXYCODONE-ACETAMINOPHEN 5-325 MG PO TABS
1.0000 | ORAL_TABLET | ORAL | Status: DC | PRN
  Administered 2014-05-21 – 2014-05-24 (×9): 1 via ORAL
  Filled 2014-05-20 (×9): qty 1

## 2014-05-20 MED ORDER — PHENYLEPHRINE HCL 10 MG/ML IJ SOLN: INTRAMUSCULAR | Status: DC | PRN

## 2014-05-20 MED ORDER — ARTIFICIAL TEARS OP OINT
TOPICAL_OINTMENT | OPHTHALMIC | Status: DC | PRN
  Administered 2014-05-20: 1 via OPHTHALMIC

## 2014-05-20 MED ORDER — FENTANYL CITRATE 0.05 MG/ML IJ SOLN
25.0000 ug | INTRAMUSCULAR | Status: DC | PRN
  Administered 2014-05-20: 25 ug via INTRAVENOUS

## 2014-05-20 MED ORDER — MEPERIDINE HCL 25 MG/ML IJ SOLN: 6.2500 mg | INTRAMUSCULAR | Status: DC | PRN

## 2014-05-20 MED ORDER — LIDOCAINE HCL (CARDIAC) 20 MG/ML IV SOLN
INTRAVENOUS | Status: DC | PRN
  Administered 2014-05-20: 30 mg via INTRAVENOUS

## 2014-05-20 SURGICAL SUPPLY — 86 items
ADH SKN CLS LQ APL DERMABOND (GAUZE/BANDAGES/DRESSINGS) ×2
APL SKNCLS STERI-STRIP NONHPOA (GAUZE/BANDAGES/DRESSINGS)
APPLICATOR CHLORAPREP 3ML ORNG (MISCELLANEOUS) ×2 IMPLANT
BAG DECANTER FOR FLEXI CONT (MISCELLANEOUS) ×3 IMPLANT
BANDAGE GAUZE 4  KLING STR (GAUZE/BANDAGES/DRESSINGS) IMPLANT
BENZOIN TINCTURE PRP APPL 2/3 (GAUZE/BANDAGES/DRESSINGS) IMPLANT
BLADE SURG ROTATE 9660 (MISCELLANEOUS) ×6 IMPLANT
BNDG GAUZE ELAST 4 BULKY (GAUZE/BANDAGES/DRESSINGS) IMPLANT
BOOT SUTURE AID YELLOW STND (SUTURE) IMPLANT
BRUSH SCRUB EZ 1% IODOPHOR (MISCELLANEOUS) ×3 IMPLANT
BUR MATCHSTICK NEURO 3.0 LAGG (BURR) IMPLANT
CANISTER SUCT 3000ML (MISCELLANEOUS) ×3 IMPLANT
CLIP RANEY DISP (INSTRUMENTS) IMPLANT
CONT SPEC 4OZ CLIKSEAL STRL BL (MISCELLANEOUS) IMPLANT
DECANTER SPIKE VIAL GLASS SM (MISCELLANEOUS) ×6 IMPLANT
DERMABOND ADHESIVE PROPEN (GAUZE/BANDAGES/DRESSINGS) ×1
DERMABOND ADVANCED .7 DNX6 (GAUZE/BANDAGES/DRESSINGS) ×1 IMPLANT
DRAPE INCISE IOBAN 85X60 (DRAPES) ×5 IMPLANT
DRAPE ORTHO SPLIT 77X108 STRL (DRAPES) ×9
DRAPE POUCH INSTRU U-SHP 10X18 (DRAPES) ×3 IMPLANT
DRAPE SURG ORHT 6 SPLT 77X108 (DRAPES) ×5 IMPLANT
DURAPREP 26ML APPLICATOR (WOUND CARE) ×6 IMPLANT
ELECT REM PT RETURN 9FT ADLT (ELECTROSURGICAL) ×3
ELECTRODE REM PT RTRN 9FT ADLT (ELECTROSURGICAL) ×2 IMPLANT
GAUZE SPONGE 2X2 8PLY STRL LF (GAUZE/BANDAGES/DRESSINGS) IMPLANT
GAUZE SPONGE 4X4 16PLY XRAY LF (GAUZE/BANDAGES/DRESSINGS) IMPLANT
GLOVE BIO SURGEON STRL SZ 6.5 (GLOVE) ×3 IMPLANT
GLOVE BIO SURGEON STRL SZ7.5 (GLOVE) IMPLANT
GLOVE BIO SURGEON STRL SZ8 (GLOVE) ×2 IMPLANT
GLOVE BIOGEL PI IND STRL 7.0 (GLOVE) ×1 IMPLANT
GLOVE BIOGEL PI IND STRL 7.5 (GLOVE) IMPLANT
GLOVE BIOGEL PI IND STRL 8 (GLOVE) ×4 IMPLANT
GLOVE BIOGEL PI IND STRL 8.5 (GLOVE) ×2 IMPLANT
GLOVE BIOGEL PI INDICATOR 7.0 (GLOVE) ×1
GLOVE BIOGEL PI INDICATOR 7.5 (GLOVE)
GLOVE BIOGEL PI INDICATOR 8 (GLOVE) ×2
GLOVE BIOGEL PI INDICATOR 8.5 (GLOVE) ×1
GLOVE ECLIPSE 8.0 STRL XLNG CF (GLOVE) ×3 IMPLANT
GLOVE ECLIPSE 8.5 STRL (GLOVE) ×3 IMPLANT
GLOVE EXAM NITRILE LRG STRL (GLOVE) IMPLANT
GLOVE EXAM NITRILE MD LF STRL (GLOVE) IMPLANT
GLOVE EXAM NITRILE XL STR (GLOVE) IMPLANT
GLOVE EXAM NITRILE XS STR PU (GLOVE) IMPLANT
GOWN STRL NON-REIN LRG LVL3 (GOWN DISPOSABLE) ×6 IMPLANT
GOWN STRL REUS W/ TWL LRG LVL3 (GOWN DISPOSABLE) IMPLANT
GOWN STRL REUS W/ TWL XL LVL3 (GOWN DISPOSABLE) ×2 IMPLANT
GOWN STRL REUS W/TWL 2XL LVL3 (GOWN DISPOSABLE) ×3 IMPLANT
GOWN STRL REUS W/TWL LRG LVL3 (GOWN DISPOSABLE)
GOWN STRL REUS W/TWL XL LVL3 (GOWN DISPOSABLE) ×3
HEMOSTAT SURGICEL 2X14 (HEMOSTASIS) ×3 IMPLANT
KIT BASIN OR (CUSTOM PROCEDURE TRAY) ×3 IMPLANT
KIT ROOM TURNOVER OR (KITS) ×3 IMPLANT
LAPROSCOPIC SCISSORS ×2 IMPLANT
NDL HYPO 25X1 1.5 SAFETY (NEEDLE) ×1 IMPLANT
NEEDLE HYPO 25X1 1.5 SAFETY (NEEDLE) ×3 IMPLANT
NS IRRIG 1000ML POUR BTL (IV SOLUTION) ×3 IMPLANT
PACK LAMINECTOMY NEURO (CUSTOM PROCEDURE TRAY) ×3 IMPLANT
PAD ARMBOARD 7.5X6 YLW CONV (MISCELLANEOUS) ×9 IMPLANT
PNEUMO SURE ×3 IMPLANT
SHEATH COOK PEEL AWAY SET 9F (SHEATH) ×5 IMPLANT
SHEATH PERITONEAL INTRO 46 (MISCELLANEOUS) IMPLANT
SHEATH PERITONEAL INTRO 61 (MISCELLANEOUS) IMPLANT
SLEEVE ENDOPATH XCEL 5M (ENDOMECHANICALS) ×5 IMPLANT
SPONGE GAUZE 2X2 STER 10/PKG (GAUZE/BANDAGES/DRESSINGS)
SPONGE LAP 4X18 X RAY DECT (DISPOSABLE) IMPLANT
SPONGE SURGIFOAM ABS GEL SZ50 (HEMOSTASIS) ×3 IMPLANT
STAPLER SKIN PROX WIDE 3.9 (STAPLE) ×5 IMPLANT
STRIP CLOSURE SKIN 1/2X4 (GAUZE/BANDAGES/DRESSINGS) ×3 IMPLANT
STRIP CLOSURE SKIN 1/4X4 (GAUZE/BANDAGES/DRESSINGS) IMPLANT
SUT ETHILON 3 0 PS 1 (SUTURE) IMPLANT
SUT MON AB 4-0 PC3 18 (SUTURE) ×3 IMPLANT
SUT NURALON 4 0 TR CR/8 (SUTURE) IMPLANT
SUT SILK 0 TIES 10X30 (SUTURE) IMPLANT
SUT SILK 1 TIES 10X30 (SUTURE) IMPLANT
SUT SILK 3 0 SH 30 (SUTURE) IMPLANT
SUT VIC AB 2-0 CP2 18 (SUTURE) ×3 IMPLANT
SUT VIC AB 3-0 SH 8-18 (SUTURE) ×3 IMPLANT
SUT VIC AB 4-0 PS2 27 (SUTURE) ×2 IMPLANT
TOWEL OR 17X24 6PK STRL BLUE (TOWEL DISPOSABLE) ×3 IMPLANT
TOWEL OR 17X26 10 PK STRL BLUE (TOWEL DISPOSABLE) ×3 IMPLANT
TRAY FOLEY CATH 14FRSI W/METER (CATHETERS) IMPLANT
TRAY LAPAROSCOPIC (CUSTOM PROCEDURE TRAY) ×3 IMPLANT
TROCAR XCEL BLUNT TIP 100MML (ENDOMECHANICALS) ×3 IMPLANT
TROCAR XCEL NON-BLD 5MMX100MML (ENDOMECHANICALS) ×5 IMPLANT
UNDERPAD 30X30 INCONTINENT (UNDERPADS AND DIAPERS) ×3 IMPLANT
WATER STERILE IRR 1000ML POUR (IV SOLUTION) ×3 IMPLANT

## 2014-05-20 NOTE — Progress Notes (Addendum)
  Subjective: Only nods to questions  Objective: Vital signs in last 24 hours: Temp:  [97.5 F (36.4 C)-98.6 F (37 C)] 98.1 F (36.7 C) (09/29 0456) Pulse Rate:  [55-65] 65 (09/29 0456) Resp:  [18-20] 20 (09/29 0456) BP: (99-131)/(60-96) 131/96 mmHg (09/29 0456) SpO2:  [94 %-100 %] 94 % (09/29 0456)    Intake/Output from previous day: 09/28 0701 - 09/29 0700 In: -  Out: 925 [Urine:925] Intake/Output this shift:    Head: VP shunt on L Resp: clear to auscultation bilaterally Cardio: regular rate and rhythm GI: soft, NT  Lab Results:   Recent Labs  05/18/14 0023 05/18/14 0847  WBC 9.8 8.3  HGB 15.3 13.9  HCT 42.9 39.9  PLT 338 266   BMET  Recent Labs  05/18/14 0023 05/18/14 0847  NA 136* 136*  K 4.1 3.4*  CL 94* 94*  CO2 29 32  GLUCOSE 147* 112*  BUN 14 13  CREATININE 0.97 0.87  CALCIUM 9.3 8.7   PT/INR No results found for this basename: LABPROT, INR,  in the last 72 hours ABG  Recent Labs  05/18/14 0126  PHART 7.342*  HCO3 28.6*    Studies/Results: No results found.  Anti-infectives: Anti-infectives   Start     Dose/Rate Route Frequency Ordered Stop   05/20/14 1300  ceFAZolin (ANCEF) IVPB 2 g/50 mL premix     2 g 100 mL/hr over 30 Minutes Intravenous  Once 05/20/14 0825        Assessment/Plan: Communicating hydrocephalus with malfunctioning VP shunt - for laparoscopic repositioning vs replacement today in OR together with Dr. Ellene Route. The procedure, risks, and benefits of my portion was D/W his wife and she agrees. Hold Lovenox.  LOS: 2 days    Randilyn Foisy E 05/20/2014

## 2014-05-20 NOTE — Transfer of Care (Signed)
Immediate Anesthesia Transfer of Care Note  Patient: Kirk Robinson  Procedure(s) Performed: Procedure(s): LAPAROSCOPIC REVISION VENTRICULAR-PERITONEAL (V-P) SHUNT (Right)  Patient Location: PACU  Anesthesia Type:General  Level of Consciousness: awake and alert   Airway & Oxygen Therapy: Patient Spontanous Breathing and Patient connected to nasal cannula oxygen  Post-op Assessment: Report given to PACU RN and Post -op Vital signs reviewed and stable  Post vital signs: Reviewed and stable  Complications: No apparent anesthesia complications

## 2014-05-20 NOTE — Anesthesia Procedure Notes (Signed)
Procedure Name: Intubation Date/Time: 05/20/2014 1:18 PM Performed by: Susa Loffler Pre-anesthesia Checklist: Patient identified, Timeout performed, Emergency Drugs available, Suction available and Patient being monitored Patient Re-evaluated:Patient Re-evaluated prior to inductionOxygen Delivery Method: Circle system utilized Preoxygenation: Pre-oxygenation with 100% oxygen Intubation Type: IV induction Ventilation: Mask ventilation without difficulty Laryngoscope Size: Mac and 4 Grade View: Grade I Tube type: Oral Tube size: 7.5 mm Number of attempts: 1 Airway Equipment and Method: Stylet Placement Confirmation: ETT inserted through vocal cords under direct vision,  positive ETCO2 and breath sounds checked- equal and bilateral Secured at: 23 cm Tube secured with: Tape Dental Injury: Teeth and Oropharynx as per pre-operative assessment

## 2014-05-20 NOTE — Op Note (Signed)
05/18/2014 - 05/20/2014  2:10 PM  PATIENT:  Kirk Robinson  58 y.o. male  PRE-OPERATIVE DIAGNOSIS:  Hydrocephalus  POST-OPERATIVE DIAGNOSIS:  hydrocephalus  PROCEDURE:  Procedure(s): LAPAROSCOPIC REVISION VENTRICULAR-PERITONEAL (V-P) SHUNT  SURGEON:  Surgeon(s): Kristeen Miss, MD Georganna Skeans, MD  ANESTHESIA:   local and general  EBL:  Total I/O In: -  Out: 650 [Urine:650]  BLOOD ADMINISTERED:none  DRAINS: none   SPECIMEN:  No Specimen  DISPOSITION OF SPECIMEN:  N/A  COUNTS:  YES  DICTATION: .Dragon Dictation Patient's indicating hydrocephalus status post CVA and has ventricular peritoneal shunt in place. This is malfunctioning. We'll proceed with laparoscopic revision and possible replacement. Informed consent was obtained from his wife. He received intravenous antibiotics. He was brought to the operating room and general endotracheal anesthesia was administered by the anesthesia staff. Abdomen chest neck and head were all prepped and draped in sterile fashion. Time out procedure was performed. Infraumbilical region was injected with local. The umbilical incision was made. Subcutaneous tissues were dissected down revealing the anterior fascia. This was divided sharply along the midline. Peritoneal cavity was entered under direct vision carefully. 0 Vicryl pursestring was placed on the fascial opening. Hassan trocar was inserted. Abdomen was insufflated with carbon dioxide in standard fashion. Laparoscopic exploration revealed the shunt entering the left upper quadrant. We traced the shunt down into the pelvis. Next, a 5 mm port was placed in the right lower quadrant. Local was also used at these port site and it was done under direct vision. The shunt seemed to enter down into the pelvis. There was a bit of redundancy. This was pulled up out of the pelvis and, once we did that, flow was noted from the tip of the catheter of CSF while pumping the shunt. Decision was made to trim back  the catheter as it was quite long and has likely become stuck down into the pelvis. Approximately 8 cm were trimmed back in this was removed from the abdominal cavity. We then rechecked the flow from the tip and, while pumping, and it flowed very easily. Catheter was directed in the lower peritoneal region but not down into the pelvis. Pneumoperitoneum was released. Ports were removed. The umbilical fascia was closed by tying the pursestring. Hemostasis was obtained and the skin of each wound was closed with running 4 Vicryl subcuticular followed by Dermabond. Dr. Ellene Route proceeded with reprogramming of the shunt. All counts were correct. There were no apparent complications. He was taken recovery in stable condition.  PATIENT DISPOSITION:  PACU - hemodynamically stable.   Delay start of Pharmacological VTE agent (>24hrs) due to surgical blood loss or risk of bleeding:  no  Georganna Skeans, MD, MPH, FACS Pager: [phone]  9/29/20152:10 PM

## 2014-05-20 NOTE — Op Note (Signed)
Date of surgery: 05/20/2014 Preoperative diagnosis: Ventriculoperitoneal shunt malfunction Postoperative diagnosis: Ventriculoperitoneal shunt malfunction Procedure: Laparoscopic exploration of peritoneal cavity, evaluation and repositioning of peritoneal catheter. Evaluation of shunt function. Co-surgeon's: Dr. Georganna Skeans, Dr. Kristeen Miss. Anesthesia: Gen. endotracheal Indications: Mr. Kirk Robinson is a 58 year old individual who had a stroke which resulted in hydrocephalus. He's had a ventriculoperitoneal shunt which has been quite reliable recently he was noted to have developed significant ventriculomegaly with neurologic dysfunction. Evaluation of the shunt noted that it seemed to function normal from the cranial portion but the distal end was felt to be obstructed. The peroneal cavity is now being explored to identify the distal end of the catheter and see if it is functioning.  Procedure: Dr. Georganna Skeans performed the procedure by entering the peritoneal cavity with the laparoscope. We are able to easily identify the peritoneal catheter and noted its path into the right lower quadrant and then into the pelvis. The catheter appeared to be lying free in the peritoneal meal cavity. The catheter was then pulled out he pelvic region. By pumping the shunt we're able to easily express cerebral spinal fluid. Because it appeared that the length of the catheter was such that it may have been coiled into the pelvis is decided at this point to trim a portion of the catheter measuring approximately 10 cm. The distal portion of the catheter was then removed. The portion that remained in the peritoneal cavity was noted to freely exit spinal fluid when the proximal portion was pumped. At this point it was felt that replacement of the ventriculoperitoneal shunt was not going to be necessary. Dr. Grandville Silos then removed the laparoscope and the entry cannulas and closed the wound. Once the closure was  performed I reprogrammed the ventriculoperitoneal shunt to drain at 70 mm of water pressure. The patient tolerated the procedure well.

## 2014-05-20 NOTE — Anesthesia Preprocedure Evaluation (Addendum)
Anesthesia Evaluation  Patient identified by MRN, date of birth, ID band Patient confused    Reviewed: Allergy & Precautions, H&P , NPO status , Patient's Chart, lab work & pertinent test results  History of Anesthesia Complications Negative for: history of anesthetic complications  Airway Mallampati: II TM Distance: >3 FB Neck ROM: Full    Dental  (+) Caps, Dental Advisory Given   Pulmonary shortness of breath, COPD COPD inhaler and oxygen dependent, Current Smoker,  Pulmonary fibrosis: O2 dependent, steroids breath sounds clear to auscultation        Cardiovascular - anginanegative cardio ROS  Rhythm:Regular Rate:Normal     Neuro/Psych Hydrocephalus s/p cerebral hemorrhage: VPS CVA    GI/Hepatic Neg liver ROS, GERD-  Medicated and Controlled,  Endo/Other  Hypothyroidism   Renal/GU negative Renal ROS     Musculoskeletal  (+) Arthritis -,   Abdominal   Peds  Hematology   Anesthesia Other Findings   Reproductive/Obstetrics                        Anesthesia Physical Anesthesia Plan  ASA: III  Anesthesia Plan: General   Post-op Pain Management:    Induction: Intravenous  Airway Management Planned: Oral ETT  Additional Equipment:   Intra-op Plan:   Post-operative Plan: Extubation in OR  Informed Consent: I have reviewed the patients History and Physical, chart, labs and discussed the procedure including the risks, benefits and alternatives for the proposed anesthesia with the patient or authorized representative who has indicated his/her understanding and acceptance.   Dental advisory given and Consent reviewed with POA  Plan Discussed with: CRNA and Surgeon  Anesthesia Plan Comments: (Plan routine monitors, GETA)        Anesthesia Quick Evaluation

## 2014-05-20 NOTE — Anesthesia Postprocedure Evaluation (Signed)
  Anesthesia Post-op Note  Patient: Kirk Robinson  Procedure(s) Performed: Procedure(s): LAPAROSCOPIC REVISION VENTRICULAR-PERITONEAL (V-P) SHUNT (Right)  Patient Location: PACU  Anesthesia Type:General  Level of Consciousness: awake, alert , patient cooperative and responds to stimulation  Airway and Oxygen Therapy: Patient Spontanous Breathing and Patient connected to nasal cannula oxygen  Post-op Pain: none  Post-op Assessment: Post-op Vital signs reviewed, Patient's Cardiovascular Status Stable, Respiratory Function Stable, Patent Airway, No signs of Nausea or vomiting and Pain level controlled  Post-op Vital Signs: Reviewed and stable  Last Vitals:  Filed Vitals:   05/20/14 1504  BP:   Pulse: 61  Temp: 36.3 C  Resp: 11    Complications: No apparent anesthesia complications

## 2014-05-20 NOTE — Progress Notes (Signed)
Patient ID: Kirk Robinson, male   DOB: Jul 20, 1956, 57 y.o.   MRN: [id number] I will signs are stable Motor function is intact Mobilizing well

## 2014-05-20 NOTE — Progress Notes (Signed)
Pt returned from surgical procedure asleep, stable, responsive to name and pain. Pt denied pain or discomfort PACU nurse stated that he received Fentanyl IV prior to returning to unit. Vital signs assessed within normal limits. He remains on O2 via nasal cannula O2 sat at this time 96%.   No noted distress.  During skin assessment noted x2 incisions on abdomen dermabond. Will continue to monitor.

## 2014-05-21 ENCOUNTER — Inpatient Hospital Stay (HOSPITAL_COMMUNITY): Payer: 59

## 2014-05-21 ENCOUNTER — Encounter (HOSPITAL_COMMUNITY): Payer: Self-pay

## 2014-05-21 LAB — BODY FLUID CULTURE
Culture: NO GROWTH
Gram Stain: NONE SEEN
Special Requests: NORMAL

## 2014-05-21 NOTE — Progress Notes (Signed)
Subjective: Patient reports Still lethargic and minimally responsive though it appears that his eyes are open more and he seems a bit more attentive to me. Wife notes that last night he attempted to get out of bed and was going to go p.m. the sink at which point he became lightheaded and was about to fall when she caught  Objective: Vital signs in last 24 hours: Temp:  [97.3 F (36.3 C)-98.4 F (36.9 C)] 98.4 F (36.9 C) (09/30 0957) Pulse Rate:  [61-83] 71 (09/30 0957) Resp:  [11-22] 20 (09/30 0957) BP: (95-114)/(57-68) 106/65 mmHg (09/30 0957) SpO2:  [96 %-100 %] 99 % (09/30 0957)  Intake/Output from previous day: 09/29 0701 - 09/30 0700 In: 1100 [I.V.:1100] Out: 650 [Urine:650] Intake/Output this shift: Total I/O In: 120 [P.O.:120] Out: -   His incisions are clean and dry. Repeat CT scan demonstrates that his ventricles are markedly decompressed  Lab Results:  Recent Labs  05/20/14 1840  WBC 9.1  HGB 12.8*  HCT 37.1*  PLT 239   BMET  Recent Labs  05/20/14 1840  CREATININE 0.76    Studies/Results: Ct Head Wo Contrast  05/21/2014   CLINICAL DATA:  Follow-up ventriculoperitoneal shunt revision.  EXAM: CT HEAD WITHOUT CONTRAST  TECHNIQUE: Contiguous axial images were obtained from the base of the skull through the vertex without intravenous contrast.  COMPARISON:  CT of the head May 18, 2014  FINDINGS: Ventriculoperitoneal shunt via high of LEFT frontal burr hole, distal tip crosses midline terminating the frontal horn of the RIGHT lateral ventricle. Resolution of hydrocephalus, ventricles are somewhat slit-like on today's examination. Periventricular white matter hypodensities are slightly decreased from prior examination. Low-density along bifrontal catheter tracts. No intraparenchymal hemorrhage, mass effect, midline shift or acute large vascular territory infarct. Basal cisterns are patent.  High RIGHT frontal burr hole. No skull fracture. Ocular globes and  orbital contents are unremarkable. Visualized paranasal sinuses and mastoid air cells appear well-aerated.  IMPRESSION: Ventriculoperitoneal catheter via LEFT frontal from burr hole, distal tip in the RIGHT frontal horn of lateral ventricle, with somewhat slit-like ventricles, this could reflect over shunting, recommend correlation with shunt pressures.  Residual periventricular white matter hypodensities suggest resolving transependymal flow of cerebral spinal fluid.  Similar low-density cytotoxic edema versus retrograde flow of CSF the along the bifrontal catheter tracts.   Electronically Signed   By: Elon Alas   On: 05/21/2014 05:26    Assessment/Plan: Stable with radiographic improvement in ventricular size. Hopefully clinical improvement soon to follow  LOS: 3 days  Continue supportive care. Patient is not yet ready to be discharged home   Ronetta Molla J 05/21/2014, 11:04 AM

## 2014-05-21 NOTE — Progress Notes (Signed)
Oriented and F/C for me. CT improved. I spoke with his wife. Patient examined and I agree with the assessment and plan  Georganna Skeans, MD, MPH, FACS Trauma: [phone] General Surgery: [phone]  05/21/2014 1:45 PM

## 2014-05-21 NOTE — Progress Notes (Signed)
Patient ID: Kirk Robinson, male   DOB: 06/09/1956, 58 y.o.   MRN: [id number] 1 Day Post-Op  Subjective: Pt awake and somewhat responsive.  Wife states he is definitely not back to baseline though.  Tolerating clear liquids  Objective: Vital signs in last 24 hours: Temp:  [97.3 F (36.3 C)-98.4 F (36.9 C)] 98.4 F (36.9 C) (09/30 0957) Pulse Rate:  [61-83] 71 (09/30 0957) Resp:  [11-22] 20 (09/30 0957) BP: (95-114)/(57-68) 106/65 mmHg (09/30 0957) SpO2:  [96 %-100 %] 99 % (09/30 0957) Last BM Date: 05/17/14  Intake/Output from previous day: 09/29 0701 - 09/30 0700 In: 1100 [I.V.:1100] Out: 650 [Urine:650] Intake/Output this shift: Total I/O In: 120 [P.O.:120] Out: -   PE: Abd: soft, minimally tender, incisions c/d/i with dermabond present  Lab Results:   Recent Labs  05/20/14 1840  WBC 9.1  HGB 12.8*  HCT 37.1*  PLT 239   BMET  Recent Labs  05/20/14 1840  CREATININE 0.76   PT/INR No results found for this basename: LABPROT, INR,  in the last 72 hours CMP     Component Value Date/Time   NA 136* 05/18/2014 0847   K 3.4* 05/18/2014 0847   CL 94* 05/18/2014 0847   CO2 32 05/18/2014 0847   GLUCOSE 112* 05/18/2014 0847   BUN 13 05/18/2014 0847   CREATININE 0.76 05/20/2014 1840   CREATININE 0.77 02/26/2014 1610   CALCIUM 8.7 05/18/2014 0847   PROT 6.6 05/18/2014 0847   ALBUMIN 3.2* 05/18/2014 0847   AST 18 05/18/2014 0847   ALT 24 05/18/2014 0847   ALKPHOS 73 05/18/2014 0847   BILITOT 0.6 05/18/2014 0847   GFRNONAA >90 05/20/2014 1840   GFRAA >90 05/20/2014 1840   Lipase  No results found for this basename: lipase       Studies/Results: Ct Head Wo Contrast  05/21/2014   CLINICAL DATA:  Follow-up ventriculoperitoneal shunt revision.  EXAM: CT HEAD WITHOUT CONTRAST  TECHNIQUE: Contiguous axial images were obtained from the base of the skull through the vertex without intravenous contrast.  COMPARISON:  CT of the head May 18, 2014  FINDINGS:  Ventriculoperitoneal shunt via high of LEFT frontal burr hole, distal tip crosses midline terminating the frontal horn of the RIGHT lateral ventricle. Resolution of hydrocephalus, ventricles are somewhat slit-like on today's examination. Periventricular white matter hypodensities are slightly decreased from prior examination. Low-density along bifrontal catheter tracts. No intraparenchymal hemorrhage, mass effect, midline shift or acute large vascular territory infarct. Basal cisterns are patent.  High RIGHT frontal burr hole. No skull fracture. Ocular globes and orbital contents are unremarkable. Visualized paranasal sinuses and mastoid air cells appear well-aerated.  IMPRESSION: Ventriculoperitoneal catheter via LEFT frontal from burr hole, distal tip in the RIGHT frontal horn of lateral ventricle, with somewhat slit-like ventricles, this could reflect over shunting, recommend correlation with shunt pressures.  Residual periventricular white matter hypodensities suggest resolving transependymal flow of cerebral spinal fluid.  Similar low-density cytotoxic edema versus retrograde flow of CSF the along the bifrontal catheter tracts.   Electronically Signed   By: Elon Alas   On: 05/21/2014 05:26    Anti-infectives: Anti-infectives   Start     Dose/Rate Route Frequency Ordered Stop   05/20/14 1334  bacitracin 50,000 Units in sodium chloride irrigation 0.9 % 500 mL irrigation  Status:  Discontinued       As needed 05/20/14 1335 05/20/14 1423   05/20/14 1300  ceFAZolin (ANCEF) IVPB 2 g/50 mL premix     2  g 100 mL/hr over 30 Minutes Intravenous  Once 05/20/14 0825 05/20/14 1320       Assessment/Plan  1. POD 1, s/p revision of VP shunt  Plan: 1. Patient's abdomen is benign s/p laparoscopic revision of VP shunt.  Will defer further care to Dr. Ellene Route.  Please call us prn.   LOS: 3 days    Abbrielle Batts E 05/21/2014, 10:03 AM Pager: [phone]

## 2014-05-21 NOTE — Clinical Documentation Improvement (Addendum)
  Patient admitted with malfunctioning VP Shunt.  CT head on admission showed that the patient's ventricular size was markedly increased.  Patient described as "altered mental status, decreased level of consciousness, severely lethargic that arouses to painful stimuli only, and obtunded.  Possible Clinical Conditions:   - Acute Encephalopathy, including type, 2/2 increased intracranial pressure   - Other condition   - Unable to clinically determine  Thank You, Erling Conte ,RN Clinical Documentation Specialist:  [phone] [place] Information Management

## 2014-05-22 MED ORDER — ALBUTEROL SULFATE (2.5 MG/3ML) 0.083% IN NEBU: 2.5000 mg | INHALATION_SOLUTION | Freq: Four times a day (QID) | RESPIRATORY_TRACT | Status: DC | PRN

## 2014-05-22 NOTE — Progress Notes (Signed)
UR complete.  Mekenna Finau RN, MSN 

## 2014-05-22 NOTE — Progress Notes (Signed)
[facility] Progress Note  2 Days Post-Op  Subjective: Pt doing well, very alert and answering questions appropriately except for his birth year which he says is 10 when its actually 11 ([birth year]).  Wife notes significant improvement.    Objective: Vital signs in last 24 hours: Temp:  [97.6 F (36.4 C)-98.4 F (36.9 C)] 97.9 F (36.6 C) (10/01 0540) Pulse Rate:  [63-72] 63 (10/01 0540) Resp:  [18-20] 18 (10/01 0540) BP: (96-118)/(62-73) 118/71 mmHg (10/01 0540) SpO2:  [98 %-100 %] 100 % (10/01 0540) Last BM Date: 05/17/14  Intake/Output from previous day: 09/30 0701 - 10/01 0700 In: 120 [P.O.:120] Out: 1725 [Urine:1725] Intake/Output this shift:    PE: Gen:  Alert, NAD, pleasant Card:  RRR, no M/G/R heard Pulm:  No wheezing or ralls, course breath sounds noted on expiration in the right lower lobe Abd: Soft, NT/ND, great BS, no HSM, incisions C/D/I, dermabond in place around umbilicus and RLQ incisions Neuro:  Alert and oriented to person, place and date as well as the president, birthday (except month)   Lab Results:   Recent Labs  05/20/14 1840  WBC 9.1  HGB 12.8*  HCT 37.1*  PLT 239   BMET  Recent Labs  05/20/14 1840  CREATININE 0.76   PT/INR No results found for this basename: LABPROT, INR,  in the last 72 hours CMP     Component Value Date/Time   NA 136* 05/18/2014 0847   K 3.4* 05/18/2014 0847   CL 94* 05/18/2014 0847   CO2 32 05/18/2014 0847   GLUCOSE 112* 05/18/2014 0847   BUN 13 05/18/2014 0847   CREATININE 0.76 05/20/2014 1840   CREATININE 0.77 02/26/2014 1610   CALCIUM 8.7 05/18/2014 0847   PROT 6.6 05/18/2014 0847   ALBUMIN 3.2* 05/18/2014 0847   AST 18 05/18/2014 0847   ALT 24 05/18/2014 0847   ALKPHOS 73 05/18/2014 0847   BILITOT 0.6 05/18/2014 0847   GFRNONAA >90 05/20/2014 1840   GFRAA >90 05/20/2014 1840   Lipase  No results found for this basename: lipase       Studies/Results: Ct Head Wo Contrast  05/21/2014   CLINICAL  DATA:  Follow-up ventriculoperitoneal shunt revision.  EXAM: CT HEAD WITHOUT CONTRAST  TECHNIQUE: Contiguous axial images were obtained from the base of the skull through the vertex without intravenous contrast.  COMPARISON:  CT of the head May 18, 2014  FINDINGS: Ventriculoperitoneal shunt via high of LEFT frontal burr hole, distal tip crosses midline terminating the frontal horn of the RIGHT lateral ventricle. Resolution of hydrocephalus, ventricles are somewhat slit-like on today's examination. Periventricular white matter hypodensities are slightly decreased from prior examination. Low-density along bifrontal catheter tracts. No intraparenchymal hemorrhage, mass effect, midline shift or acute large vascular territory infarct. Basal cisterns are patent.  High RIGHT frontal burr hole. No skull fracture. Ocular globes and orbital contents are unremarkable. Visualized paranasal sinuses and mastoid air cells appear well-aerated.  IMPRESSION: Ventriculoperitoneal catheter via LEFT frontal from burr hole, distal tip in the RIGHT frontal horn of lateral ventricle, with somewhat slit-like ventricles, this could reflect over shunting, recommend correlation with shunt pressures.  Residual periventricular white matter hypodensities suggest resolving transependymal flow of cerebral spinal fluid.  Similar low-density cytotoxic edema versus retrograde flow of CSF the along the bifrontal catheter tracts.   Electronically Signed   By: Elon Alas   On: 05/21/2014 05:26    Anti-infectives: Anti-infectives   Start     Dose/Rate Route Frequency Ordered  Stop   05/20/14 1334  bacitracin 50,000 Units in sodium chloride irrigation 0.9 % 500 mL irrigation  Status:  Discontinued       As needed 05/20/14 1335 05/20/14 1423   05/20/14 1300  ceFAZolin (ANCEF) IVPB 2 g/50 mL premix     2 g 100 mL/hr over 30 Minutes Intravenous  Once 05/20/14 0825 05/20/14 1320       Assessment/Plan 1. POD 2, s/p revision of VP  shunt  2. COPD/Fibrosis 3. Recent pneumonia  Plan:  1.  Patient's abdomen is benign s/p laparoscopic revision of VP shunt. Will defer further care to Dr. Ellene Route. Please call us prn.  2.  Resumed home albuterol neb and started IS, given his h/o of pulmonary problems he needs to be aggressive with pulm toilet. 3.  Tolerating regular diet, no evidence of ileus 4.  Mobilize as able to aid in recovery - per Dr. Ellene Route 5.  SCD's and lovenox    LOS: 4 days    Coralie Keens 05/22/2014, 7:31 AM Pager: [phone]

## 2014-05-22 NOTE — Progress Notes (Signed)
Patient ID: Kirk Robinson, male   DOB: 1956/08/12, 58 y.o.   MRN: [id number] Vital signs are stable Motor function is improving Patient appears more alert and interactive this morning Wife also corroborates that things are improving but still has significant slowness of mentation and slowness of activity continue supportive

## 2014-05-22 NOTE — Progress Notes (Signed)
Agree Carollyn Etcheverry, MD, MPH, FACS Trauma: [phone] General Surgery: [phone]  

## 2014-05-23 ENCOUNTER — Inpatient Hospital Stay (HOSPITAL_COMMUNITY): Payer: 59

## 2014-05-23 MED ORDER — POLYETHYLENE GLYCOL 3350 17 G PO PACK
17.0000 g | PACK | Freq: Every day | ORAL | Status: DC
  Administered 2014-05-24: 17 g via ORAL
  Filled 2014-05-23: qty 1

## 2014-05-23 NOTE — Progress Notes (Signed)
Patient ID: Kirk Robinson, male   DOB: 1956-07-21, 58 y.o.   MRN: [id number] 3 Days Post-Op  Subjective: Pt feels better.  Eating well.  Hasn't had a BM yet.  Wife still feels as if there are some cognitive delays.  Objective: Vital signs in last 24 hours: Temp:  [97.6 F (36.4 C)-98.4 F (36.9 C)] 98.4 F (36.9 C) (10/02 0627) Pulse Rate:  [55-69] 62 (10/02 0627) Resp:  [18-20] 18 (10/02 0627) BP: (100-128)/(69-78) 128/72 mmHg (10/02 0627) SpO2:  [92 %-100 %] 98 % (10/02 0627) Last BM Date: 05/17/14  Intake/Output from previous day: 10/01 0701 - 10/02 0700 In: 480 [P.O.:480] Out: 1400 [Urine:1400] Intake/Output this shift:    PE: Abd: soft, minimally tender, but appropriate around incisions, +BS, ND HEENT: disconjugate gaze with let pupil bigger than right  Lab Results:   Recent Labs  05/20/14 1840  WBC 9.1  HGB 12.8*  HCT 37.1*  PLT 239   BMET  Recent Labs  05/20/14 1840  CREATININE 0.76   PT/INR No results found for this basename: LABPROT, INR,  in the last 72 hours CMP     Component Value Date/Time   NA 136* 05/18/2014 0847   K 3.4* 05/18/2014 0847   CL 94* 05/18/2014 0847   CO2 32 05/18/2014 0847   GLUCOSE 112* 05/18/2014 0847   BUN 13 05/18/2014 0847   CREATININE 0.76 05/20/2014 1840   CREATININE 0.77 02/26/2014 1610   CALCIUM 8.7 05/18/2014 0847   PROT 6.6 05/18/2014 0847   ALBUMIN 3.2* 05/18/2014 0847   AST 18 05/18/2014 0847   ALT 24 05/18/2014 0847   ALKPHOS 73 05/18/2014 0847   BILITOT 0.6 05/18/2014 0847   GFRNONAA >90 05/20/2014 1840   GFRAA >90 05/20/2014 1840   Lipase  No results found for this basename: lipase       Studies/Results: No results found.  Anti-infectives: Anti-infectives   Start     Dose/Rate Route Frequency Ordered Stop   05/20/14 1334  bacitracin 50,000 Units in sodium chloride irrigation 0.9 % 500 mL irrigation  Status:  Discontinued       As needed 05/20/14 1335 05/20/14 1423   05/20/14 1300  ceFAZolin (ANCEF) IVPB 2  g/50 mL premix     2 g 100 mL/hr over 30 Minutes Intravenous  Once 05/20/14 0825 05/20/14 1320       Assessment/Plan  1. POD 3, s/p laparoscopic revision of VP shunt  Plan:  1. Will add daily miralax to bowel regimen to help him move his bowels.  Otherwise his abdomen is stable.   LOS: 5 days    Toleen Lachapelle E 05/23/2014, 10:04 AM Pager: [phone]

## 2014-05-23 NOTE — Evaluation (Signed)
Occupational Therapy Evaluation Patient Details Name: Kirk Robinson MRN: [id number] DOB: Aug 31, 1955 Today's Date: 05/23/2014    History of Present Illness pt presents post VP Shunt revision with recent admit for malfunctioning shunt and AMS.     Clinical Impression   This 58 yo male admitted with above presents to acute OT with decreased balance, decreased cognition (question "seeing things" that are not there (see cognitive section), decreased safety awareness, drop in O2 sats on RA (on RA at rest 99-100%; ambulated 20 feet down the hall and sats on RA 82%--no outward signs he was dropping this low)--all affecting pts ability to safely and independently care for himself at home as he was pta while his wife works (even 1/2 day). He will continue to benefit from acute OT with follow up OT on CIR.    Follow Up Recommendations  CIR    Equipment Recommendations  None recommended by OT       Precautions / Restrictions Precautions Precautions: Fall Precaution Comments: Wears home O2--currently 3 liters Restrictions Weight Bearing Restrictions: No      Mobility Bed Mobility Overal bed mobility: Needs Assistance Bed Mobility: Supine to Sit;Sit to Supine     Supine to sit: Supervision;HOB elevated Sit to supine: Supervision;HOB elevated      Transfers Overall transfer level: Needs assistance Equipment used: Rolling walker (2 wheeled) Transfers: Sit to/from Stand Sit to Stand: Min assist         General transfer comment: VCs for safe hand placement and tendency towards a posterior lean    Balance Overall balance assessment: Needs assistance Sitting-balance support: Feet supported;No upper extremity supported Sitting balance-Leahy Scale: Good Sitting balance - Comments: when doffing and donning his socks he was in a posterior lean/pelvic tilt (with abs working the whole time)--did not lose balance Postural control: Posterior lean Standing balance support: No upper  extremity supported Standing balance-Leahy Scale: Poor Standing balance comment: pt can stand statically without A; however even if you just ask him to turn his head he will start to sway                            ADL   Eating/Feeding: Supervision/ safety;Sitting   Grooming: Supervision/safety;Set up;Sitting   Upper Body Bathing: Supervision/ safety;Set up;Sitting   Lower Body Bathing: Minimal assistance;Sit to/from stand (due to decreased balance in standing)   Upper Body Dressing : Set up;Supervision/safety;Sitting   Lower Body Dressing: Minimal assistance;Sit to/from stand (due to decreased balance in standing)   Toilet Transfer: Minimal assistance;Ambulation;RW (bed>out door and down hall to sit in chair (about 20 feet))   Toileting- Clothing Manipulation and Hygiene: Minimal assistance;Sit to/from stand (due to decreased balance in standing)                         Pertinent Vitals/Pain Pain Assessment: No/denies pain Pain Score: 0-No pain     Hand Dominance Right   Extremity/Trunk Assessment Upper Extremity Assessment Upper Extremity Assessment: Overall WFL for tasks assessed (Did not see the intermittent jerking that his wife says has been occurring in the last 2-3 weeks)           Communication Communication Communication: No difficulties   Cognition Arousal/Alertness: Awake/alert Behavior During Therapy: Flat affect Overall Cognitive Status: Impaired/Different from baseline                 General Comments: He was able to tell me the date  and place with increased time. A couple of times during the session he was leaning over and looking around while seated. The first time I asked him what he was looking at, he said he was looking for the dogs, we should have dogs around here. When I said where are we, we just talked about where we were and you told me he said you really should have dogs around here--but did not answer my question.   The second time he was leaning over and looking "at something" I asked what he was looking at and he said, "just looking".               Home Living   Living Arrangements: Spouse/significant other Available Help at Discharge: Family;Available PRN/intermittently Type of Home: House Home Access: Stairs to enter [employer] of Steps: 6 Entrance Stairs-Rails: Right;Left;Can reach both Home Layout: One level     Bathroom Shower/Tub: Tub/shower unit;Curtain Shower/tub characteristics: Architectural technologist: Standard     Home Equipment: Environmental consultant - 2 wheels;Cane - single point;Wheelchair - manual;Shower seat;Hand held shower head;Bedside commode          Prior Functioning/Environment Level of Independence: Independent        Comments: Occasionally used cane.      OT Diagnosis: Generalized weakness;Cognitive deficits;Disturbance of vision   OT Problem List: Impaired balance (sitting and/or standing);Cardiopulmonary status limiting activity;Decreased cognition;Decreased safety awareness;Impaired vision/perception   OT Treatment/Interventions: Self-care/ADL training;Patient/family education;Balance training;Cognitive remediation/compensation;DME and/or AE instruction    OT Goals(Current goals can be found in the care plan section) Acute Rehab OT Goals Patient Stated Goal: did not ask OT Goal Formulation: With patient/family Time For Goal Achievement: 05/30/14 Potential to Achieve Goals: Good  OT Frequency: Min 2X/week   Barriers to D/C: Decreased caregiver support             End of Session Equipment Utilized During Treatment: Gait belt;Rolling walker;Oxygen  Activity Tolerance: Patient tolerated treatment well Patient left: in bed;with call bell/phone within reach;with family/visitor present   Time: 2426-8341 OT Time Calculation (min): 43 min Charges:  OT General Charges $OT Visit: 1 Procedure OT Evaluation $Initial OT Evaluation Tier I: 1  Procedure OT Treatments $Self Care/Home Management : 38-52 mins  Almon Register [phone] 05/23/2014, 2:57 PM

## 2014-05-23 NOTE — Evaluation (Signed)
Physical Therapy Evaluation Patient Details Name: Kirk Robinson MRN: [id number] DOB: Nov 20, 1955 Today's Date: 05/23/2014   History of Present Illness  pt presents post VP Shunt revision with recent admit for malfunctioning shunt and AMS.    Clinical Impression  Pt with decreased mobility and safety 2/2 cognitive deficits.  Pt with impaired balance when attempting to mobilize and engage in cognitive task.  Per wife pt is "sharp" at baseline and is concerned about current deficits.  Wife also indicates that current "tremors" in UEs are not baseline.  Feel pt would benefit from CIR at D/C to maximize independence and decrease burden of care prior to returning to home with wife.  Will continue to follow acutely.      Follow Up Recommendations CIR    Equipment Recommendations  None recommended by PT    Recommendations for Other Services Rehab consult     Precautions / Restrictions Precautions Precautions: Fall Precaution Comments: Wears home O2.   Restrictions Weight Bearing Restrictions: No      Mobility  Bed Mobility Overal bed mobility: Needs Assistance Bed Mobility: Supine to Sit     Supine to sit: Min assist;HOB elevated     General bed mobility comments: cues for sequencing and attempting log roll to minimize strain on abdominal incision.  pt needs increased time.    Transfers Overall transfer level: Needs assistance Equipment used: Rolling walker (2 wheeled) Transfers: Sit to/from Stand Sit to Stand: Min assist         General transfer comment: cues for UE use as pt tends to pul on RW.  cues to get closer to chair prior to sitting.    Ambulation/Gait Ambulation/Gait assistance: Min guard Ambulation Distance (Feet): 40 Feet Assistive device: Rolling walker (2 wheeled) Gait Pattern/deviations: Step-through pattern;Decreased stride length;Scissoring;Ataxic;Narrow base of support     General Gait Details: pt with mild scissoring and narrow BOS worse during  cognitive tasks while ambulating.  pt with decreased balance during mobility when engaging cognitively.    Stairs            Wheelchair Mobility    Modified Rankin (Stroke Patients Only)       Balance Overall balance assessment: Needs assistance Sitting-balance support: No upper extremity supported;Feet supported Sitting balance-Leahy Scale: Fair     Standing balance support: No upper extremity supported;During functional activity Standing balance-Leahy Scale: Fair Standing balance comment: pt able to stand statically without A, however cannot accept any balance challenges without UE support.                               Pertinent Vitals/Pain Pain Assessment: 0-10 Pain Score: 4  Pain Location: Abdomen Pain Descriptors / Indicators: Sore Pain Intervention(s): Repositioned;Premedicated before session    Home Living Family/patient expects to be discharged to:: Inpatient rehab Living Arrangements: Spouse/significant other Available Help at Discharge: Family;Available 24 hours/day Type of Home: House Home Access: Stairs to enter Entrance Stairs-Rails: Right;Left;Can reach both Entrance Stairs-Number of Steps: 6 Home Layout: One level Home Equipment: Walker - 2 wheels;Cane - single point;Wheelchair - manual      Prior Function Level of Independence: Independent with assistive device(s)         Comments: Occasionally used cane.       Hand Dominance   Dominant Hand: Right    Extremity/Trunk Assessment   Upper Extremity Assessment: Defer to OT evaluation           Lower Extremity  Assessment: RLE deficits/detail;LLE deficits/detail RLE Deficits / Details: Decreased coordination more noted during dual tasks of walking and answering questions.   LLE Deficits / Details: Decreased coordination more noted during dual tasks of walking and answering questions.    Cervical / Trunk Assessment: Normal  Communication   Communication: No difficulties   Cognition Arousal/Alertness: Awake/alert Behavior During Therapy: WFL for tasks assessed/performed Overall Cognitive Status: Impaired/Different from baseline Area of Impairment: Attention;Memory;Following commands;Safety/judgement;Awareness;Problem solving   Current Attention Level: Alternating Memory: Decreased short-term memory Following Commands: Follows multi-step commands with increased time Safety/Judgement: Decreased awareness of deficits;Decreased awareness of safety Awareness: Emergent Problem Solving: Slow processing;Decreased initiation;Difficulty sequencing;Requires verbal cues;Requires tactile cues General Comments: Per wife baseline cognition is "sharp".  Today pt unable to recall names of grandchildren in picture and needs cueing for locations in pictures.  pt has difficulty dividing attention especially during mobility task needs additional cueing for cognitive tasks while ambulating.      General Comments      Exercises        Assessment/Plan    PT Assessment Patient needs continued PT services  PT Diagnosis Difficulty walking   PT Problem List Decreased activity tolerance;Decreased balance;Decreased mobility;Decreased coordination;Decreased cognition;Decreased knowledge of use of DME;Decreased safety awareness;Pain  PT Treatment Interventions DME instruction;Gait training;Stair training;Functional mobility training;Therapeutic activities;Therapeutic exercise;Balance training;Neuromuscular re-education;Cognitive remediation;Patient/family education   PT Goals (Current goals can be found in the Care Plan section) Acute Rehab PT Goals Patient Stated Goal: to go home PT Goal Formulation: With patient/family Time For Goal Achievement: 06/06/14 Potential to Achieve Goals: Good    Frequency Min 3X/week   Barriers to discharge        Co-evaluation               End of Session Equipment Utilized During Treatment: Gait belt;Oxygen Activity Tolerance:  Patient tolerated treatment well Patient left: in chair;with call bell/phone within reach;with chair alarm set;with family/visitor present Nurse Communication: Mobility status         Time: 4540-9811 PT Time Calculation (min): 39 min   Charges:   PT Evaluation $Initial PT Evaluation Tier I: 1 Procedure PT Treatments $Gait Training: 8-22 mins $Therapeutic Activity: 8-22 mins   PT G CodesCatarina Hartshorn, [place] 05/23/2014, 9:27 AM

## 2014-05-23 NOTE — Discharge Instructions (Signed)
[facility] ______[facility] °LAPAROSCOPIC SURGERY: POST OP INSTRUCTIONS °Always review your discharge instruction sheet given to you by the facility where your surgery was performed. °IF YOU HAVE DISABILITY OR FAMILY LEAVE FORMS, YOU MUST BRING THEM TO THE OFFICE FOR PROCESSING.   °DO NOT GIVE THEM TO YOUR DOCTOR. ° °1. A prescription for pain medication may be given to you upon discharge.  Take your pain medication as prescribed, if needed.  If narcotic pain medicine is not needed, then you may take acetaminophen (Tylenol) or ibuprofen (Advil) as needed. °2. Take your usually prescribed medications unless otherwise directed. °3. If you need a refill on your pain medication, please contact your pharmacy.  They will contact our office to request authorization. Prescriptions will not be filled after 5pm or on week-ends. °4. You should follow a light diet the first few days after arrival home, such as soup and crackers, etc.  Be sure to include lots of fluids daily. °5. Most patients will experience some swelling and bruising in the area of the incisions.  Ice packs will help.  Swelling and bruising can take several days to resolve.  °6. It is common to experience some constipation if taking pain medication after surgery.  Increasing fluid intake and taking a stool softener (such as Colace) will usually help or prevent this problem from occurring.  A mild laxative (Milk of Magnesia or Miralax) should be taken according to package instructions if there are no bowel movements after 48 hours. °7. Unless discharge instructions indicate otherwise, you may remove your bandages 24-48 hours after surgery, and you may shower at that time.  You may have steri-strips (small skin tapes) in place directly over the incision.  These strips should be left on the skin for 7-10 days.  If your surgeon used skin glue on the incision, you may shower in 24 hours.  The glue will flake off over the next 2-3 weeks.  Any sutures or  staples will be removed at the office during your follow-up visit. °8. ACTIVITIES:  You may resume regular (light) daily activities beginning the next day--such as daily self-care, walking, climbing stairs--gradually increasing activities as tolerated.  You may have sexual intercourse when it is comfortable.  Refrain from any heavy lifting or straining until approved by your doctor. °a. You may drive when you are no longer taking prescription pain medication, you can comfortably wear a seatbelt, and you can safely maneuver your car and apply brakes. °b. RETURN TO WORK:  __________________________________________________________ °9. You should see your doctor in the office for a follow-up appointment approximately 2-3 weeks after your surgery.  Make sure that you call for this appointment within a day or two after you arrive home to insure a convenient appointment time. °10. OTHER INSTRUCTIONS: __________________________________________________________________________________________________________________________ __________________________________________________________________________________________________________________________ °WHEN TO CALL YOUR DOCTOR: °1. Fever over 101.0 °2. Inability to urinate °3. Continued bleeding from incision. °4. Increased pain, redness, or drainage from the incision. °5. Increasing abdominal pain ° °The clinic staff is available to answer your questions during regular business hours.  Please don’t hesitate to call and ask to speak to one of the nurses for clinical concerns.  If you have a medical emergency, go to the nearest emergency room or call 911.  A surgeon from [facility] is always on call at the hospital. °[address], [address], [place], [place]  [address] ? [address] [address], [place], [place]   [address] °[phone] ? [phone] ? FAX [phone] °Web site:   [email or web address] °

## 2014-05-23 NOTE — Progress Notes (Signed)
Rehab Admissions Coordinator Note:  Patient was screened by Cleatrice Burke for appropriateness for an Inpatient Acute Rehab Consult per PT eval.  At this time, we are recommending if pt not at his baseline, would recommend an inpt rehab consult. OT eval would be helpful.  Cleatrice Burke 05/23/2014, 1:04 PM  I can be reached at [phone].

## 2014-05-23 NOTE — Progress Notes (Signed)
Patient ID: Kirk Robinson, male   DOB: [birth year], 58 y.o.   MRN: [id number] Vital signs are stable Neuro status appears to be improving albeit slowly Patient's wife is concerned that his level of consciousness and interaction is far below where it normally should be Because of this new CT scan has been ordered CT results demonstrate that the ventricles remain well decompressed Discuss situation with wife She is not ready nor does she feel capable of taking care of him in its occurrence situation with his neuro status as it is We can continue to observe here for another day or 2 Am hopeful that his neuro status should improve If no improvement is noted ultimately may require an MRI of her further workup

## 2014-05-23 NOTE — Care Management Note (Addendum)
    Page 1 of 1   05/23/2014     11:29:06 AM CARE MANAGEMENT NOTE 05/23/2014  Patient:  Kirk Robinson, Kirk Robinson   Account Number:  [id number]  Date Initiated:  05/19/2014  Documentation initiated by:  Lorne Skeens  Subjective/Objective Assessment:   Patient was admitted with altered mental status. VP shunt malfunction. Lives at home with spouse.     Action/Plan:   Will follow for discharge needs pending physician orders.   Anticipated DC Date:     Anticipated DC Plan:  HOME/SELF CARE         Choice offered to / List presented to:             Status of service:  In process, will continue to follow Medicare Important Message given?   (If response is "NO", the following Medicare IM given date fields will be blank) Date Medicare IM given:   Medicare IM given by:   Date Additional Medicare IM given:   Additional Medicare IM given by:    Discharge Disposition:    Per UR Regulation:  Reviewed for med. necessity/level of care/duration of stay  If discussed at [place] of Stay Meetings, dates discussed:    Comments:  05/23/14 [place], MSN, [place] with patient's wife, who states that patient has home oxygen through [facility].  If patient is discharged today, he does not have a portable oxygen tank available, as patient's son took the tank home with him at admission and is not available today.  If patient is discharged today, CM will contact [facility] to supply a portable tank for discharge home.  RN is aware and will notify CM if patient is to be discharged later today.

## 2014-05-23 NOTE — Progress Notes (Signed)
Please call with further questions. Anticipate D/C soon. Patient examined and I agree with the assessment and plan  Georganna Skeans, MD, MPH, FACS Trauma: [phone] General Surgery: [phone]  05/23/2014 12:38 PM

## 2014-05-24 NOTE — Discharge Summary (Signed)
Physician Discharge Summary  Patient ID: Kirk Robinson MRN: [id number] DOB/AGE: [birth year] 58 y.o.  Admit date: 05/18/2014 Discharge date: 05/24/2014  Admission Diagnoses:Shunt failure  Discharge Diagnoses: Shunt failure Active Problems:   Obstructed VP shunt   Hydrocephalus in adult   Discharged Condition: good  Hospital Course: Kirk Robinson was recently an inpatient approximately 2 weeks ago with possible pneumonia and a malfunctioning VPshunt. The shunt had been placed 10 years ago, and he had had absolutely no problems with it. With the admission 2 weeks ago ventricles were clearly dilated, and he was barely responsive. I tapped the shunt, and found easy flow. There not having been any manipulation of the shunt both an abdominal CT and abdominal ultrasound were performed. Both tests were normal, without evidence of a distal obstruction. A shunt series also showed the shunt to be in continuity. I scheduled him for a shunt revision, but also reprogrammed his shunt to 46mmH2o. After ~48 hours he was markedly improved and back at baseline. I had another head CT performed and it showed smaller ventricle size compared to the admitting scan. I discharged him to home and he did well until 1day after discharge. He changed and was no longer at baseline. Readmitted to the hospital on the 27th, with a new head ct showing enlarged ventricles once more. Dr. Ellene Route tapped his shunt without difficulty. CSF was normal. Taken to the OR where the distal catheter was evaluated. Spontaneous csf flow was seen,thus the catheter was shortened. Post op he also had the shunt programmed to 75mm h2o. A head ct yesterday 10/2 showed very small ventricles again indicating that the shunt works. If he has another spontaneous failure I will plan on changing the valve, and leaving system on the left side, since ventricular catheter works well.  Treatments: surgery: as above.   Discharge Exam: Blood pressure 103/66, pulse  66, temperature 97.9 F (36.6 C), temperature source Oral, resp. rate 18, height 5\' 9"  (1.753 m), weight 75.751 kg (167 lb), SpO2 99.00%. General appearance: alert, cooperative and appears stated age Neurologic: Alert and oriented X 3, normal strength and tone. Normal symmetric reflexes. Normal coordination and gait  Disposition: 01-Home or Self Care Hydrocephalus    Medication List    ASK your doctor about these medications       atorvastatin 20 MG tablet  Commonly known as:  LIPITOR  Take 20 mg by mouth daily.     clotrimazole 1 % cream  Commonly known as:  LOTRIMIN  Apply 1 application topically 2 (two) times daily.     diazepam 5 MG tablet  Commonly known as:  VALIUM  Take 5 mg by mouth daily.     morphine 100 MG 12 hr tablet  Commonly known as:  MS CONTIN  Take 100 mg by mouth every 8 (eight) hours.     NUCYNTA 75 MG Tabs  Generic drug:  Tapentadol HCl  Take 1 tablet by mouth every 4 (four) hours as needed (Take every 4-6 hours as needed for pain).     nystatin 100000 UNIT/ML suspension  Commonly known as:  MYCOSTATIN  Take 5 mLs by mouth 3 (three) times daily. For 10 days     omeprazole 20 MG capsule  Commonly known as:  PRILOSEC  Take 1 capsule (20 mg total) by mouth daily.     predniSONE 5 MG tablet  Commonly known as:  DELTASONE  Take 5 mg by mouth as directed. Take 4 tablets twice daily for 3 days, then  take 4 tablets once daily for 3 days, then take 3 tablets once daily till seen by Dr. Vella Kohler.     sertraline 100 MG tablet  Commonly known as:  ZOLOFT  Take 1.5 tablets (150 mg total) by mouth daily.     thyroid 90 MG tablet  Commonly known as:  ARMOUR  Take 90 mg by mouth daily.     tiotropium 18 MCG inhalation capsule  Commonly known as:  SPIRIVA  Place 18 mcg into inhaler and inhale daily.     Vitamin D (Ergocalciferol) 50000 UNITS Caps capsule  Commonly known as:  DRISDOL  Take 50,000 Units by mouth daily.           Follow-up Information    Follow up with Zenovia Jarred, MD. (As needed)    Specialty:  General Surgery   Contact information:   [address] [place] [address] [phone]       Follow up with Winfield Cunas, MD. (As needed)    Specialty:  Neurosurgery   Contact information:   [address] [place] [address] [phone]       Signed: Omaria Plunk L 05/24/2014, 2:32 PM

## 2014-05-24 NOTE — Progress Notes (Signed)
Occupational Therapy Treatment Patient Details Name: Kirk Robinson MRN: [id number] DOB: Nov 07, 1955 Today's Date: 05/24/2014    History of present illness pt presents post VP Shunt revision with recent admit for malfunctioning shunt and AMS.     OT comments  Pt. Progressing well with skilled OT.  Still requiring increased time for task completion and safety with sequencing.  Will benefit from continued skilled therapies to increase safety and independence with ADLS.   Follow Up Recommendations  CIR    Equipment Recommendations  None recommended by OT          Precautions / Restrictions Precautions Precautions: Fall Precaution Comments: Wears home O2--currently 3 liters       Mobility Bed Mobility Overal bed mobility: Needs Assistance Bed Mobility: Rolling;Sidelying to Sit;Supine to Sit Rolling: Min guard Sidelying to sit: Min guard Supine to sit: Min guard     General bed mobility comments: HOB flat, no rails to simulate home environment, increased time secondary to abdominal incision pain  Transfers Overall transfer level: Needs assistance Equipment used: Rolling walker (2 wheeled) Transfers: Sit to/from [facility] Sit to Stand: Min guard;Min assist Stand pivot transfers: Min guard;Min assist       General transfer comment: VCs for safe hand placement and tendency towards a posterior lean    Balance     Sitting balance-Leahy Scale: Good       Standing balance-Leahy Scale: Poor                     ADL Overall ADL's : Needs assistance/impaired     Grooming: Oral care;Min guard;Cueing for sequencing;Sitting Grooming Details (indicate cue type and reason): pt. required increased time for task completion secondary to visible B hand tremors and difficulty sequencing screwing t.paste cap on/off.                   Toilet Transfer: Minimal assistance;Ambulation;RW;Cueing for safety Toilet Transfer Details (indicate cue type and  reason): required sequencing cues for safety with B legs touching surface and reaching for arm rests on 3-n-1 over commode prior to sitting down Toileting- Clothing Manipulation and Hygiene: Minimal assistance;Sit to/from stand       Functional mobility during ADLs: Minimal assistance General ADL Comments: motivated to complete adl tasks, wife present and does a great job with allowing pt. to attempt tasks without assistance until needed.                                        Cognition     Overall Cognitive Status: Impaired/Different from baseline Area of Impairment: Attention;Memory;Following commands;Safety/judgement;Awareness;Problem solving   Current Attention Level: Alternating Memory: Decreased short-term memory  Following Commands: Follows multi-step commands with increased time Safety/Judgement: Decreased awareness of deficits;Decreased awareness of safety Awareness: Emergent Problem Solving: Slow processing;Decreased initiation;Difficulty sequencing;Requires verbal cues;Requires tactile cues General Comments: pt.s wife present and demonstrated good restraint with allowing pt. to answer questions even with increased time vs. answering for him.  states "i want his brain working as much as possible"  Frequency Min 2X/week     Progress Toward Goals  OT Goals(current goals can now be found in the care plan section)  Progress towards OT goals: Progressing toward goals     Plan Discharge plan remains appropriate                     End of Session Equipment Utilized During Treatment: Gait belt;Rolling walker;Oxygen   Activity Tolerance Patient tolerated treatment well   Patient Left with call bell/phone within reach;with family/visitor present             Time: 1540-0867 OT Time Calculation (min): 28  min  Charges: OT General Charges $OT Visit: 1 Procedure OT Treatments $Self Care/Home Management : 23-37 mins  Janice Coffin, COTA/L 05/24/2014, 10:20 AM

## 2014-05-24 NOTE — Progress Notes (Signed)
Physical Therapy Treatment Patient Details Name: Kirk Robinson MRN: [id number] DOB: 03/09/1956 Today's Date: 05/24/2014    History of Present Illness pt presents post VP Shunt revision with recent admit for malfunctioning shunt and AMS.      PT Comments    Pt still demo's decr'd balance and safety awareness. Pt to be discharged with spouse for home today. No follow up was ordered by MD (PT/OT have recommended CIR). Spouse to be with pt 24 hours a day until Monday, if at that time pt has not cleared cognitively spouse plans to pursue home assistance with [facility] due to her having to work and not able to stay with him after that time.   Follow Up Recommendations  CIR     Equipment Recommendations  None recommended by PT    Recommendations for Other Services Rehab consult     Precautions / Restrictions Precautions Precautions: Fall Precaution Comments: Wears home O2--currently 3 liters Restrictions Weight Bearing Restrictions: No    Mobility  Bed Mobility Overal bed mobility: Needs Assistance       Supine to sit: Supervision     General bed mobility comments: HOB flat, no rails to simulate home environment, increased time secondary to abdominal incision pain. no physical assist or cues needed.  Transfers Overall transfer level: Needs assistance Equipment used: Rolling walker (2 wheeled) Transfers: Sit to/from Stand Sit to Stand: Min guard         General transfer comment: cues for hand placement and for ant weight shift to stand.  Ambulation/Gait Ambulation/Gait assistance: Min guard;Supervision Ambulation Distance (Feet): 300 Feet Assistive device: Rolling walker (2 wheeled) Gait Pattern/deviations: Step-through pattern;Decreased stride length;Narrow base of support;Trunk flexed Gait velocity: decreased Gait velocity interpretation: Below normal speed for age/gender General Gait Details: cues on posture and walker position with gait. pt with veering right/left  with head turns. demo's decr'd balance if engaged in conversation or cognitive task, otherwise needed supervison only.   Stairs Stairs: Yes Stairs assistance: Min guard Stair Management: Two rails;Step to pattern;Forwards Number of Stairs: 3 General stair comments: cues to use step-to pattern for energy conservation and safety. spouse educated on this as well.  Wheelchair Mobility    Modified Rankin (Stroke Patients Only)       Cognition Arousal/Alertness: Awake/alert Behavior During Therapy: Flat affect Overall Cognitive Status: Impaired/Different from baseline Area of Impairment: Attention;Memory;Following commands;Safety/judgement;Awareness;Problem solving   Current Attention Level: Alternating Memory: Decreased short-term memory Following Commands: Follows multi-step commands with increased time Safety/Judgement: Decreased awareness of deficits;Decreased awareness of safety Awareness: Emergent Problem Solving: Slow processing;Decreased initiation;Difficulty sequencing;Requires verbal cues;Requires tactile cues General Comments: wife supportive and encourageing, allows pt to answer for himself as much as pt can.     Pertinent Vitals/Pain Pain Assessment: 0-10 Pain Score: 7  Pain Location: abdomen Pain Descriptors / Indicators: Sore Pain Intervention(s): Monitored during session;Premedicated before session;Repositioned     PT Goals (current goals can now be found in the care plan section) Acute Rehab PT Goals Patient Stated Goal: did not ask PT Goal Formulation: With patient/family Time For Goal Achievement: 06/06/14 Potential to Achieve Goals: Good Progress towards PT goals: Progressing toward goals    Frequency  Min 3X/week    PT Plan Current plan remains appropriate    End of Session Equipment Utilized During Treatment: Gait belt;Oxygen Activity Tolerance: Patient tolerated treatment well Patient left: in chair;with call bell/phone within reach;with  family/visitor present;with nursing/sitter in room     Time: 7616-0737 PT Time Calculation (min): 23 min  Charges:  $Gait Training: 8-22 mins $Therapeutic Activity: 8-22 mins                    G Codes:      Willow Ora June 11, 2014, 3:34 PM  Willow Ora, PTA Office- [phone]

## 2014-07-11 ENCOUNTER — Ambulatory Visit: Payer: Self-pay | Admitting: Specialist

## 2014-08-12 ENCOUNTER — Emergency Department (HOSPITAL_COMMUNITY): Payer: 59

## 2014-08-12 ENCOUNTER — Inpatient Hospital Stay (HOSPITAL_COMMUNITY)
Admission: EM | Admit: 2014-08-12 | Discharge: 2014-08-14 | DRG: 193 | Disposition: A | Discharge status: Home / Self Care | Payer: 59 | Attending: Family Medicine | Admitting: Family Medicine

## 2014-08-12 ENCOUNTER — Encounter (HOSPITAL_COMMUNITY): Payer: Self-pay | Admitting: Cardiology

## 2014-08-12 DIAGNOSIS — G894 Chronic pain syndrome: Secondary | ICD-10-CM | POA: Diagnosis present

## 2014-08-12 DIAGNOSIS — Z888 Allergy status to other drugs, medicaments and biological substances status: Secondary | ICD-10-CM

## 2014-08-12 DIAGNOSIS — R7309 Other abnormal glucose: Secondary | ICD-10-CM | POA: Diagnosis present

## 2014-08-12 DIAGNOSIS — Z982 Presence of cerebrospinal fluid drainage device: Secondary | ICD-10-CM | POA: Diagnosis not present

## 2014-08-12 DIAGNOSIS — J449 Chronic obstructive pulmonary disease, unspecified: Secondary | ICD-10-CM | POA: Diagnosis present

## 2014-08-12 DIAGNOSIS — E890 Postprocedural hypothyroidism: Secondary | ICD-10-CM | POA: Diagnosis present

## 2014-08-12 DIAGNOSIS — F17210 Nicotine dependence, cigarettes, uncomplicated: Secondary | ICD-10-CM | POA: Diagnosis present

## 2014-08-12 DIAGNOSIS — R509 Fever, unspecified: Secondary | ICD-10-CM | POA: Diagnosis present

## 2014-08-12 DIAGNOSIS — M810 Age-related osteoporosis without current pathological fracture: Secondary | ICD-10-CM | POA: Diagnosis present

## 2014-08-12 DIAGNOSIS — J8410 Pulmonary fibrosis, unspecified: Secondary | ICD-10-CM | POA: Diagnosis present

## 2014-08-12 DIAGNOSIS — R4182 Altered mental status, unspecified: Secondary | ICD-10-CM | POA: Insufficient documentation

## 2014-08-12 DIAGNOSIS — Z8673 Personal history of transient ischemic attack (TIA), and cerebral infarction without residual deficits: Secondary | ICD-10-CM | POA: Diagnosis not present

## 2014-08-12 DIAGNOSIS — J441 Chronic obstructive pulmonary disease with (acute) exacerbation: Secondary | ICD-10-CM | POA: Diagnosis present

## 2014-08-12 DIAGNOSIS — N179 Acute kidney failure, unspecified: Secondary | ICD-10-CM | POA: Diagnosis present

## 2014-08-12 DIAGNOSIS — J189 Pneumonia, unspecified organism: Principal | ICD-10-CM | POA: Diagnosis present

## 2014-08-12 DIAGNOSIS — H532 Diplopia: Secondary | ICD-10-CM | POA: Diagnosis present

## 2014-08-12 DIAGNOSIS — I679 Cerebrovascular disease, unspecified: Secondary | ICD-10-CM | POA: Diagnosis present

## 2014-08-12 DIAGNOSIS — Y95 Nosocomial condition: Secondary | ICD-10-CM | POA: Diagnosis present

## 2014-08-12 DIAGNOSIS — K219 Gastro-esophageal reflux disease without esophagitis: Secondary | ICD-10-CM | POA: Diagnosis present

## 2014-08-12 DIAGNOSIS — E860 Dehydration: Secondary | ICD-10-CM | POA: Diagnosis present

## 2014-08-12 DIAGNOSIS — Z86010 Personal history of colonic polyps: Secondary | ICD-10-CM | POA: Diagnosis not present

## 2014-08-12 DIAGNOSIS — I739 Peripheral vascular disease, unspecified: Secondary | ICD-10-CM | POA: Diagnosis present

## 2014-08-12 DIAGNOSIS — E7800 Pure hypercholesterolemia, unspecified: Secondary | ICD-10-CM | POA: Diagnosis present

## 2014-08-12 DIAGNOSIS — J309 Allergic rhinitis, unspecified: Secondary | ICD-10-CM | POA: Diagnosis present

## 2014-08-12 DIAGNOSIS — G9340 Encephalopathy, unspecified: Secondary | ICD-10-CM | POA: Diagnosis present

## 2014-08-12 DIAGNOSIS — F329 Major depressive disorder, single episode, unspecified: Secondary | ICD-10-CM | POA: Diagnosis present

## 2014-08-12 DIAGNOSIS — R413 Other amnesia: Secondary | ICD-10-CM | POA: Diagnosis present

## 2014-08-12 DIAGNOSIS — Z881 Allergy status to other antibiotic agents status: Secondary | ICD-10-CM | POA: Diagnosis not present

## 2014-08-12 DIAGNOSIS — E785 Hyperlipidemia, unspecified: Secondary | ICD-10-CM | POA: Diagnosis present

## 2014-08-12 DIAGNOSIS — G92 Toxic encephalopathy: Secondary | ICD-10-CM | POA: Diagnosis present

## 2014-08-12 DIAGNOSIS — G919 Hydrocephalus, unspecified: Secondary | ICD-10-CM | POA: Diagnosis present

## 2014-08-12 DIAGNOSIS — F411 Generalized anxiety disorder: Secondary | ICD-10-CM | POA: Diagnosis present

## 2014-08-12 HISTORY — DX: Pneumonia, unspecified organism: J18.9

## 2014-08-12 LAB — CBC WITH DIFFERENTIAL/PLATELET
Basophils Absolute: 0 10*3/uL (ref 0.0–0.1)
Basophils Relative: 0 % (ref 0–1)
Eosinophils Absolute: 0 10*3/uL (ref 0.0–0.7)
Eosinophils Relative: 0 % (ref 0–5)
HCT: 36.9 % — ABNORMAL LOW (ref 39.0–52.0)
Hemoglobin: 12.6 g/dL — ABNORMAL LOW (ref 13.0–17.0)
Lymphocytes Relative: 3 % — ABNORMAL LOW (ref 12–46)
Lymphs Abs: 0.5 10*3/uL — ABNORMAL LOW (ref 0.7–4.0)
MCH: 29.5 pg (ref 26.0–34.0)
MCHC: 34.1 g/dL (ref 30.0–36.0)
MCV: 86.4 fL (ref 78.0–100.0)
Monocytes Absolute: 1 10*3/uL (ref 0.1–1.0)
Monocytes Relative: 6 % (ref 3–12)
Neutro Abs: 16.1 10*3/uL — ABNORMAL HIGH (ref 1.7–7.7)
Neutrophils Relative %: 91 % — ABNORMAL HIGH (ref 43–77)
Platelets: 199 10*3/uL (ref 150–400)
RBC: 4.27 MIL/uL (ref 4.22–5.81)
RDW: 14.1 % (ref 11.5–15.5)
WBC: 17.6 10*3/uL — ABNORMAL HIGH (ref 4.0–10.5)

## 2014-08-12 LAB — BASIC METABOLIC PANEL
Anion gap: 9 (ref 5–15)
BUN: 13 mg/dL (ref 6–23)
CO2: 27 mmol/L (ref 19–32)
Calcium: 8.8 mg/dL (ref 8.4–10.5)
Chloride: 99 mEq/L (ref 96–112)
Creatinine, Ser: 1.26 mg/dL (ref 0.50–1.35)
GFR calc Af Amer: 72 mL/min — ABNORMAL LOW (ref >90)
GFR calc non Af Amer: 62 mL/min — ABNORMAL LOW (ref >90)
Glucose, Bld: 238 mg/dL — ABNORMAL HIGH (ref 70–99)
Potassium: 3.6 mmol/L (ref 3.5–5.1)
Sodium: 135 mmol/L (ref 135–145)

## 2014-08-12 LAB — URINALYSIS, ROUTINE W REFLEX MICROSCOPIC
Bilirubin Urine: NEGATIVE
Glucose, UA: NEGATIVE mg/dL
Ketones, ur: NEGATIVE mg/dL
Leukocytes, UA: NEGATIVE
Nitrite: NEGATIVE
Protein, ur: NEGATIVE mg/dL
Specific Gravity, Urine: 1.009 (ref 1.005–1.030)
Urobilinogen, UA: 1 mg/dL (ref 0.0–1.0)
pH: 6.5 (ref 5.0–8.0)

## 2014-08-12 LAB — URINE MICROSCOPIC-ADD ON

## 2014-08-12 LAB — PROCALCITONIN: Procalcitonin: 0.14 ng/mL

## 2014-08-12 MED ORDER — SODIUM CHLORIDE 0.9 % IV SOLN
INTRAVENOUS | Status: DC
  Administered 2014-08-12 – 2014-08-13 (×2): via INTRAVENOUS

## 2014-08-12 MED ORDER — DIAZEPAM 5 MG PO TABS
5.0000 mg | ORAL_TABLET | Freq: Every day | ORAL | Status: DC
  Administered 2014-08-12 – 2014-08-13 (×2): 5 mg via ORAL
  Filled 2014-08-12 (×3): qty 1

## 2014-08-12 MED ORDER — VANCOMYCIN HCL IN DEXTROSE 1-5 GM/200ML-% IV SOLN
1000.0000 mg | Freq: Two times a day (BID) | INTRAVENOUS | Status: DC
  Administered 2014-08-13 – 2014-08-14 (×3): 1000 mg via INTRAVENOUS
  Filled 2014-08-12 (×4): qty 200

## 2014-08-12 MED ORDER — SERTRALINE HCL 100 MG PO TABS
150.0000 mg | ORAL_TABLET | Freq: Every day | ORAL | Status: DC
  Administered 2014-08-13 – 2014-08-14 (×2): 150 mg via ORAL
  Filled 2014-08-12 (×2): qty 1

## 2014-08-12 MED ORDER — TAPENTADOL HCL 75 MG PO TABS
1.0000 | ORAL_TABLET | ORAL | Status: DC | PRN
  Filled 2014-08-12 (×2): qty 1

## 2014-08-12 MED ORDER — TAPENTADOL HCL 75 MG PO TABS: 1.0000 | ORAL_TABLET | ORAL | Status: DC | PRN

## 2014-08-12 MED ORDER — SODIUM CHLORIDE 0.9 % IV BOLUS (SEPSIS)
1000.0000 mL | Freq: Once | INTRAVENOUS | Status: AC
  Administered 2014-08-12: 1000 mL via INTRAVENOUS

## 2014-08-12 MED ORDER — THYROID 60 MG PO TABS
90.0000 mg | ORAL_TABLET | Freq: Every day | ORAL | Status: DC
  Administered 2014-08-13 – 2014-08-14 (×2): 90 mg via ORAL
  Filled 2014-08-12 (×3): qty 1

## 2014-08-12 MED ORDER — CEFEPIME HCL 2 G IJ SOLR
2.0000 g | Freq: Once | INTRAMUSCULAR | Status: AC
  Administered 2014-08-12: 2 g via INTRAVENOUS
  Filled 2014-08-12: qty 2

## 2014-08-12 MED ORDER — CEFEPIME HCL 1 G IJ SOLR
1.0000 g | Freq: Three times a day (TID) | INTRAMUSCULAR | Status: DC
  Administered 2014-08-13 – 2014-08-14 (×5): 1 g via INTRAVENOUS
  Filled 2014-08-12 (×7): qty 1

## 2014-08-12 MED ORDER — ATORVASTATIN CALCIUM 20 MG PO TABS
20.0000 mg | ORAL_TABLET | Freq: Every day | ORAL | Status: DC
  Administered 2014-08-13 – 2014-08-14 (×2): 20 mg via ORAL
  Filled 2014-08-12 (×2): qty 1

## 2014-08-12 MED ORDER — PREDNISONE 5 MG PO TABS
25.0000 mg | ORAL_TABLET | Freq: Every day | ORAL | Status: DC
  Administered 2014-08-13 – 2014-08-14 (×2): 25 mg via ORAL
  Filled 2014-08-12 (×3): qty 1

## 2014-08-12 MED ORDER — THYROID 30 MG PO TABS: 90.0000 mg | ORAL_TABLET | Freq: Every day | ORAL | Status: DC

## 2014-08-12 MED ORDER — TIOTROPIUM BROMIDE MONOHYDRATE 18 MCG IN CAPS
18.0000 ug | ORAL_CAPSULE | Freq: Every day | RESPIRATORY_TRACT | Status: DC
  Administered 2014-08-13: 18 ug via RESPIRATORY_TRACT
  Filled 2014-08-12 (×2): qty 5

## 2014-08-12 MED ORDER — ENOXAPARIN SODIUM 40 MG/0.4ML ~~LOC~~ SOLN
40.0000 mg | SUBCUTANEOUS | Status: DC
  Administered 2014-08-12 – 2014-08-13 (×2): 40 mg via SUBCUTANEOUS
  Filled 2014-08-12 (×3): qty 0.4

## 2014-08-12 MED ORDER — VANCOMYCIN HCL 10 G IV SOLR
1500.0000 mg | Freq: Once | INTRAVENOUS | Status: AC
  Administered 2014-08-12: 1500 mg via INTRAVENOUS
  Filled 2014-08-12: qty 1500

## 2014-08-12 MED ORDER — VITAMIN D (ERGOCALCIFEROL) 1.25 MG (50000 UNIT) PO CAPS: 50000.0000 [IU] | ORAL_CAPSULE | ORAL | Status: DC

## 2014-08-12 NOTE — ED Notes (Signed)
Patient transported to X-ray 

## 2014-08-12 NOTE — Progress Notes (Signed)
ANTIBIOTIC CONSULT NOTE - INITIAL  Pharmacy Consult for vancomycin and cefepime Indication: pneumonia  Allergies  Allergen Reactions  . Erythromycin Nausea And Vomiting  . Iodinated Diagnostic Agents Shortness Of Breath  . Advair Diskus [Fluticasone-Salmeterol]     Short of breath  . Darvocet [Propoxyphene N-Acetaminophen]   . Doxycycline Hyclate Swelling    Tongue swelling, upset stomach  . Septra [Sulfamethoxazole-Trimethoprim]     Patient Measurements: Weight: 167 lb (75.751 kg)   Vital Signs: Temp: 99 F (37.2 C) (12/22 1250) Temp Source: Rectal (12/22 1250) BP: 107/60 mmHg (12/22 1434) Pulse Rate: 71 (12/22 1434) Intake/Output from previous day:   Intake/Output from this shift:    Labs:  Recent Labs  08/12/14 1159  WBC 17.6*  HGB 12.6*  PLT 199  CREATININE 1.26   Estimated Creatinine Clearance: 65.5 mL/min (by C-G formula based on Cr of 1.26). No results for input(s): VANCOTROUGH, VANCOPEAK, VANCORANDOM, GENTTROUGH, GENTPEAK, GENTRANDOM, TOBRATROUGH, TOBRAPEAK, TOBRARND, AMIKACINPEAK, AMIKACINTROU, AMIKACIN in the last 72 hours.   Microbiology: No results found for this or any previous visit (from the past 720 hour(s)).  Medical History: Past Medical History  Diagnosis Date  . Altered mental status   . Pneumonia, organism unspecified   . Memory loss   . Osteoporosis, unspecified     bone density per Mor[facility] in 2012  . Iodine hypothyroidism   . Anxiety state, unspecified   . Pure hypercholesterolemia   . Hematuria 03/16/2007  . Allergic rhinitis, cause unspecified   . Alcohol use 03/16/2007    patient risk factors  . Esophageal reflux   . Tobacco use disorder 03/16/2007    patient risk factors  . 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 Cervicalgia 09/17/2007  . Diplopia 12/21/2007  . Other abnormal glucose 04/08/2008  . Personal history of colonic polyps   . Other diseases of lung, not elsewhere classified  01/23/2009    s/p Fleming/pulmonology consult; intolerant  to Advair  . Gastritis 05/13/2009  . Peripheral vascular disease, unspecified 12/24/2009  . Unspecified hypothyroidism 01/07/2010  . NSIP (nonspecific interstitial pneumonia)     on azathioprine  . Hyperthyroidism     graves disease  . COPD (chronic obstructive pulmonary disease)   . Shortness of breath   . Stroke 2005    Medications:  See med history Assessment: 58 yo man to start broad spectrum antibiotics for PNA.  His WBC 17.6, T max 99, CrCl ~65 ml/min  Goal of Therapy:  Vancomycin trough level 15-20 mcg/ml  Plan:  Vancomycin 1500 mg IV X 1 then 1 gm IV q12 hours Cefepime 2 gm IV X 1 then 1gm IV q8 hours F/u renal function, cultures and clinical course Vanc level when appropriate  Thanks for allowing pharmacy to be a part of this patient's care.  Excell Seltzer, PharmD Clinical Pharmacist, [phone] 08/12/2014,2:39 PM

## 2014-08-12 NOTE — Discharge Summary (Signed)
[facility] Discharge Summary  Patient name: Kirk Robinson Medical record number: [id number] Date of birth: 10/29/1955 Age: 57 y.o. Gender: male Date of Admission: 08/12/2014  Date of Discharge: 08/15/14 Admitting Physician: Andrena Mews, MD  Primary Care Provider: Reginia Forts, MD Consultants: Neurosurgery  Indication for Hospitalization: altered mental status  Discharge Diagnoses/Problem List:  Altered mental status Hydrocephalus Pulmonary fibrosis COPD HCAP Hypercholesterolemia Hypothyroidism  Disposition: Discharge home  Discharge Condition: Stable  Discharge Exam:  BP 122/79 mmHg  Pulse 70  Temp(Src) 97.6 F (36.4 C) (Oral)  Resp 18  Ht 5\' 9"  (1.753 m)  Wt 167 lb 15.9 oz (76.2 kg)  BMI 24.80 kg/m2  SpO2 99%   General: awake, alert, well nourished male, more bright appearing male sitting up in bedside chair, NAD, wife lying at bedside Cardiovascular: RRR, no m/r/g, brisk cap refill Respiratory: CTAB, no wheeze appreciated, no increased WOB Abdomen: soft, NT/ND, +BS Extremities: WWP, no cyanosis, clubbing or edema MSK: ROM limited by pain (but greatly improved compared to yesterday) Neuro: AAOx3, no focal deficits, follows commands. 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  In the ED, CXR showed a new infiltrate in the Left middle lobe. WBC 17.6.  Blood cultures were obtained.  CT head revealed a stable shunt tip with concerns for possible overdrainage of CSF.    Patient was admitted to [facility] for [facility] management.  Patient was started on Vancomycin and cefopime and treated for 2 days before transitioning to PO Levaquin.  Neurosurgery was consulted  for further evaluation and recommended that patient follow up outpatient.  By HD#2 patient's AMS resolved and he was back at his baseline.  At this time, his home MS contin was restarted at a reduced frequency in light of his recent AMS.  Patient tolerated this well.  He was transitioned to PO Levaquin after 48 hours of IV abx treatment.  He tolerated this well and was discharged with 6 more days of Levaquin (ending 12/30).  He was instructed to follow up with neurosurgery and his PCP within the next week.  Patient also had an AKI on presentation, with a Cr to 1.26.  He was hydrated with IVFs and nephrotoxic medications were avoided.  By HD#2, the AKI resolved.  Issues for Follow Up:   Recommend outpatient follow up with neurosurgery for continued shunt management.  Recommend evaluation of patient's pain regimen.  Patient on MS Contin TID.  Typical dosing BID.  Significant Procedures: none  Significant Labs and Imaging:   Recent Labs Lab 08/12/14 1159 08/13/14 0450 08/14/14 0500  WBC 17.6* 11.2* 7.5  HGB 12.6* 11.7* 12.5*  HCT 36.9* 35.3* 37.1*  PLT 199 200 209    Recent Labs Lab 08/12/14 1159 08/13/14 0450 08/14/14 0500  NA 135 140 143  K 3.6 4.1 4.6  CL 99 106 107  CO2 27 30 30   GLUCOSE 238* 170* 158*  BUN 13 10 6   CREATININE 1.26 0.85 0.83  CALCIUM 8.8 7.8* 8.4  MG  --   --  2.2   Dg Skull 1-3 Views  08/12/2014   CLINICAL DATA:  58 year old male with fever and ventriculostomy shunt. Initial encounter.  EXAM: SKULL - 1-3 VIEW  COMPARISON:  Head CT without  contrast 05/23/2014 and earlier.  FINDINGS: Left frontal approach ventriculostomy catheter re- identified. Stable radiographic appearance of this and the partially radiolucent rest or located along the left convexity. Associated shunt tubing which tracks caudally in the left neck and into the chest appears stable and intact. No acute osseous abnormality identified.  IMPRESSION: Stable left frontal approach ventriculostomy  and visible shunt. No adverse features identified.   Electronically Signed   By: Lars Pinks M.D.   On: 08/12/2014 12:56   Dg Chest 2 View  08/12/2014   CLINICAL DATA:  Known ventriculoperitoneal shunt, shortness of breath and fever  EXAM: CHEST  2 VIEW  COMPARISON:  05/18/2014  FINDINGS: Cardiac shadow is stable. A left-sided ventriculoperitoneal shunt is again noted. Diffuse interstitial changes are seen. Some new patchy infiltrate is noted in the left mid lung laterally. No other focal infiltrate is seen.  IMPRESSION: New mild infiltrate in the left mid lung superimposed over more chronic interstitial changes.   Electronically Signed   By: Inez Catalina M.D.   On: 08/12/2014 12:53   Dg Cervical Spine 1 View  08/12/2014   CLINICAL DATA:  58 year old male with fever and ventriculostomy shunt. Initial encounter.  EXAM: DG CERVICAL SPINE - 1 VIEW  COMPARISON:  Neck radiographs 05/12/2014.  FINDINGS: Lateral view of the neck. Stable visualized shunt tubing coursing inferiorly obliquely over the cervical spine. No discontinuity identified. Tubing continues into the chest. Superimposed external oxygen tubing. Stable visualized osseous structures.  IMPRESSION: Stable shunt tubing in the neck.   Electronically Signed   By: Lars Pinks M.D.   On: 08/12/2014 12:54   Dg Abd 1 View  08/12/2014   CLINICAL DATA:  58 year old male with fever and ventriculostomy shunt. Initial encounter.  EXAM: ABDOMEN - 1 VIEW  COMPARISON:  05/18/2014.  FINDINGS: Supine view at 1231 hrs. Shunt tubing enters the left upper quadrant and tracks to the right lower quadrant where it is mildly looped. No kinking or discontinuity identified. Non obstructed bowel gas pattern. No acute osseous abnormality identified.  IMPRESSION: Shunt tubing terminating in the right lower quadrant with no adverse features identified.   Electronically Signed   By: Lars Pinks M.D.   On: 08/12/2014 12:55   Ct Head Wo Contrast  08/12/2014   CLINICAL DATA:  Altered  mental status/confusion  EXAM: CT HEAD WITHOUT CONTRAST  TECHNIQUE: Contiguous axial images were obtained from the base of the skull through the vertex without intravenous contrast.  COMPARISON:  May 23, 2014  FINDINGS: The patient has a ventricular shunt catheter. The tip of the catheter is in the medial aspect of the right lentiform nucleus, stable. Note that the ventricles are not dilated and indeed are rather small. The left lateral ventricle is minimally more prominent than the right lateral ventricle, stable. There is no change in ventricular size or configuration since prior study. There is no intracranial mass, hemorrhage, extra-axial fluid collection, or midline shift. Decreased attenuation in the anterior corona radiata extending to the gray-white compartments of each frontal lobe is a stable finding. Elsewhere there is slight periventricular small vessel disease in the centra semiovale bilaterally. No new gray-white compartment lesions are identified. The bony calvarium appears intact. Visualized mastoid air cells are clear.  IMPRESSION: No appreciable change from prior study. Ventricles are decompressed. Note that the shunt catheter tip is in the medial right lentiform nucleus, stable. The degree of decreased attenuation in both frontal white matter regions is stable as is mild patchy periventricular small vessel  disease elsewhere. No acute appearing infarct is seen. No hemorrhage or mass effect.  The possibility of CSF overdrainage must remain of concern given the overall appearance, in particular to the small size of the lateral ventricles.   Electronically Signed   By: Lowella Grip M.D.   On: 08/12/2014 14:22    Results/Tests Pending at Time of Discharge: FINAL blood culture results at 5 days.  Discharge Medications:    Medication List    TAKE these medications        atorvastatin 20 MG tablet  Commonly known as:  LIPITOR  Take 20 mg by mouth daily.     diazepam 5 MG tablet   Commonly known as:  VALIUM  Take 5 mg by mouth daily.     levofloxacin 750 MG tablet  Commonly known as:  LEVAQUIN  Take 1 tablet (750 mg total) by mouth daily.     morphine 100 MG 12 hr tablet  Commonly known as:  MS CONTIN  Take 100 mg by mouth every 8 (eight) hours.     NUCYNTA 75 MG Tabs  Generic drug:  Tapentadol HCl  Take 1 tablet by mouth every 4 (four) hours as needed (Take every 4-6 hours as needed for pain).     omeprazole 20 MG capsule  Commonly known as:  PRILOSEC  Take 1 capsule (20 mg total) by mouth daily.     predniSONE 10 MG tablet  Commonly known as:  DELTASONE  Take 25 mg by mouth daily with breakfast.     sertraline 100 MG tablet  Commonly known as:  ZOLOFT  Take 1.5 tablets (150 mg total) by mouth daily.     thyroid 90 MG tablet  Commonly known as:  ARMOUR  Take 90 mg by mouth daily.     tiotropium 18 MCG inhalation capsule  Commonly known as:  SPIRIVA  Place 18 mcg into inhaler and inhale daily.     Vitamin D (Ergocalciferol) 50000 UNITS Caps capsule  Commonly known as:  DRISDOL  Take 50,000 Units by mouth every Sunday.     zoledronic acid 5 MG/100ML Soln injection  Commonly known as:  RECLAST  Inject 5 mg into the vein See admin instructions. Given once per year        Discharge Instructions: Please refer to Patient Instructions section of EMR for full details.  Patient was counseled important signs and symptoms that should prompt return to medical care, changes in medications, dietary instructions, activity restrictions, and follow up appointments.   Follow-Up Appointments: Follow-up Information    Follow up with SMITH,KRISTI, MD. Schedule an appointment as soon as possible for a visit in 1 week.   Specialty:  Family Medicine   Why:  hospital follow up   Contact information:   [place] [address] [phone]       Ronnie Doss, DO 08/14/2014, 1:08 PM PGY-1, [facility]  [facility]  Addendum:  I have seen and evaluated this patient along with Dr. Lajuana Ripple and reviewed the above note.  Tommi Rumps, MD Family Medicine PGY-3

## 2014-08-12 NOTE — ED Notes (Signed)
Pt reports that he has a VP shunt that was placed after a stroke. Family reports that the  Patient has been running a fever and having episodes of confusion over the past couple of days. Reports that he is unable to hold a regular conversation. Dr. Cyndy Freeze is his Dr.

## 2014-08-12 NOTE — H&P (Signed)
[facility] Admission History and Physical Service Pager: [phone]  Patient name: Kirk Robinson Medical record number: [id number] Date of birth: 11-17-1955 Age: 58 y.o. Gender: male  Primary Care Provider: Reginia Forts, MD Consultants: Neurosurgery Code Status: Full  Chief Complaint: HCAP  Assessment and Plan: Kirk Robinson is a 58 y.o. male presenting with fevers and altered mental status. PMH is significant for hydrocephalus s/p VP shunt, Chronic Pain Syndrome, GERD, pulmonary fibrosis, hyperlipidemia, depression, COPD, hypothyroidism s/p radioiodine therapy, anxiety, and tobacco abuse.  # SIRS/HCAP- Presented with subjective fevers and AMS. CXR showed new infiltrate. Has history of recurrent pneumonia in past. - Admitted to [facility] Medicine Teaching Service. [place] attending. - WBC 17.6, afebrile since presentation (family reports history of fever up to 103), heart rate normal, respirations 24 - CXR- new mild infiltrate in the left mid-lung superimposed over more chronic interstitial changes - Blood Cultures obtained x2 - Follow up strep pneumo urinary antigen, legionella, influenza PCR - Follow up procalcitonin.  Trend for response/severity - Monitor pulse oximetry - Vancomycin 1500mg  and Cefepime 2g given in ED. Vanc/Cefipime per pharmacy  - AM CBC and BMET  # Toxic Metabolic Encephalopathy secondary to HCAP: Increased confusion since Saturday - CT-  No change from prior study. Shunt tip stable.  - History of VP shunt placement.  - Neurosurgery consulted. Appreciate recommendations.  # AKI: Baseline Creatinine 0.6-0.8. Most likely secondary to dehydration. - Creatinine 1.26 today. - Urinalysis showed hyaline casts - NS @125  cc/hr - Avoid Nephrotoxic medications   # Hyperlipidemia: Home medication Atorvastatin 20mg  - Continue home medication  # Depression/Anxiety: Home medications Valium 5mg , Sertraline 150mg  - Continue home  medications  # Chronic Pain: Home medication MS Contin 12hr 100mg  TID, Nucynta 75mg  q4-6hr PRN - Continue home medications upon return of mental status.  Hold for now  # GERD: Home medication Omeprazole 20mg   # Hypothyroidism s/p Radioiodine Therapy:  Home medication Armour 90mg  - Continue home medication  # COPD: Home medication Spiriva 34mcg - Continue home medication  # Osteoporosis- Home medications Reclast, Drisdol  FEN/GI: NS @ 155mL/hr. Prophylaxis: Lovenox  Disposition: Admitted by [facility] Medicine Teaching Service. [place] attending.  History of Present Illness: Kirk Robinson is a 58 y.o. male presenting with fever and episodes of confusion for the past few days. Wife, Brother, and Grand-daughter present at bedside and aided with history. Family reports he has been unable to carry on a regular conversation. Wife notes that on Saturday (12/19) he appeared confused several times throughout the day. States he was back to baseline on Sunday. Began to appear confused again yesterday. [place] off of couch this morning and was too fatigued to get up, so the family decided to bring him to the ED to be evaluated. Wife states that he has had multiple episodes of pneumonia in the past and she usually checks his oxygen saturation periodically; she has not noticed any desaturations in the past few days. No objective measurements of temperature was recorded, however wife states he felt hot and would estimate his temperature to be at least 101F if not higher.   Kirk Robinson has history of VP shunt placement due to hemorrhagic stroke approximately 10years ago. Wife notes that last time he acted this confused, it was due to a blockage in his shunt that required replacement. States that Dr. Cyndy Freeze is the neurosurgeon that they follow with for shunt management.  Review Of Systems: Per HPI   Patient Active Problem List   Diagnosis Date Noted  .  Hydrocephalus in adult 05/18/2014  . Obstructed VP shunt  05/11/2014  . Chronic pain syndrome 05/11/2014  . Pulmonary fibrosis 01/14/2013  . GERD (gastroesophageal reflux disease) 01/14/2013  . Other abnormal glucose 01/14/2013  . Pure hypercholesterolemia 01/14/2013  . Hyperlipidemia 05/30/2012  . Depression 05/30/2012  . COPD (chronic obstructive pulmonary disease) 05/30/2012  . Degenerative disc disease 05/30/2012  . Hypothyroidism following radioiodine therapy 05/30/2012  . Osteoporosis 05/30/2012  . Peripheral vascular disease 05/30/2012  . Tobacco abuse 05/30/2012   Past Medical History: Past Medical History  Diagnosis Date  . Altered mental status   . Pneumonia, organism unspecified   . Memory loss   . Osteoporosis, unspecified     bone density per Morivati in 2012  . Iodine hypothyroidism   . Anxiety state, unspecified   . Pure hypercholesterolemia   . Hematuria 03/16/2007  . Allergic rhinitis, cause unspecified   . Alcohol use 03/16/2007    patient risk factors  . Esophageal reflux   . Tobacco use disorder 03/16/2007    patient risk factors  . 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 Cervicalgia 09/17/2007  . Diplopia 12/21/2007  . Other abnormal glucose 04/08/2008  . Personal history of colonic polyps   . Other diseases of lung, not elsewhere classified 01/23/2009    s/p Fleming/pulmonology consult; intolerant  to Advair  . Gastritis 05/13/2009  . Peripheral vascular disease, unspecified 12/24/2009  . Unspecified hypothyroidism 01/07/2010  . NSIP (nonspecific interstitial pneumonia)     on azathioprine  . Hyperthyroidism     graves disease  . COPD (chronic obstructive pulmonary disease)   . Shortness of breath   . Stroke 2005   Past Surgical History: Past Surgical History  Procedure Laterality Date  . Eye surgery  2010  . 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 Admission 11/2011      altered mental status/confusion with syncope.  CT head negative, MRI brain negative,  EEG: diffuse slowing but no  seizure acitivity.  Labs negative.  [facility].  Marland Kitchen Neuropsychiatric evaluation  04/2012    poor short-term memory, poor recall, delayed reaction time; permanently disabled.    . Laparoscopic revision ventricular-peritoneal (v-p) shunt Right 05/20/2014    Procedure: LAPAROSCOPIC REVISION VENTRICULAR-PERITONEAL (V-P) SHUNT;  Surgeon: Georganna Skeans, MD;  Location: [place] [facility];  Service: General;  Laterality: Right;   Social History: History  Substance Use Topics  . Smoking status: Current Every Day Smoker -- 1.00 packs/day for 30 years  . Smokeless tobacco: Not on file  . Alcohol Use: No   Please also refer to relevant sections of EMR.  Family History: Family History  Problem Relation Age of Onset  . Heart disease Father     cad, chf  . Cancer Father   . Heart disease Brother     open heart surgery  . Diabetes Brother   . Cancer Brother   . Heart disease Mother     cad  . Diabetes Mother   . Stroke Mother    Allergies and Medications: Allergies  Allergen Reactions  . Erythromycin Nausea And Vomiting  . Iodinated Diagnostic Agents Shortness Of Breath  . Advair Diskus [Fluticasone-Salmeterol]     Short of breath  . Darvocet [Propoxyphene N-Acetaminophen]   . Doxycycline Hyclate Swelling    Tongue swelling, upset stomach  . Septra [Sulfamethoxazole-Trimethoprim]    No current facility-administered medications on file prior to encounter.   Current Outpatient Prescriptions on File Prior to  Encounter  Medication Sig Dispense Refill  . atorvastatin (LIPITOR) 20 MG tablet Take 20 mg by mouth daily.    . diazepam (VALIUM) 5 MG tablet Take 5 mg by mouth daily.    Marland Kitchen morphine (MS CONTIN) 100 MG 12 hr tablet Take 100 mg by mouth every 8 (eight) hours.    Marland Kitchen omeprazole (PRILOSEC) 20 MG capsule Take 1 capsule (20 mg total) by mouth daily. 30 capsule 11  . sertraline (ZOLOFT) 100 MG tablet Take 1.5 tablets (150 mg total) by mouth daily. 45 tablet 5  . Tapentadol  HCl (NUCYNTA) 75 MG TABS Take 1 tablet by mouth every 4 (four) hours as needed (Take every 4-6 hours as needed for pain).    Marland Kitchen thyroid (ARMOUR) 90 MG tablet Take 90 mg by mouth daily.    Marland Kitchen tiotropium (SPIRIVA) 18 MCG inhalation capsule Place 18 mcg into inhaler and inhale daily.    . Vitamin D, Ergocalciferol, (DRISDOL) 50000 UNITS CAPS Take 50,000 Units by mouth every Sunday.       Objective: BP 103/69 mmHg  Pulse 61  Temp(Src) 99 F (37.2 C) (Rectal)  Resp 16  Wt 167 lb (75.751 kg)  SpO2 98% Exam: General: NAD, sleeping in bed HEENT: [place]/AT, MMM, shunt palpable L parietal region  Cardiovascular: RRR, no murmurs appreciated  Respiratory: coarse breath sounds, decreased air movement L > R.  No crackles, rales, or wheezing appreciated  Abdomen: Soft/NT/ND, NABS Extremities: No edema B/L Skin: No rashes or appreciated ulcerations  Neuro: Easily arousable, no focal deficits.  Alert to person, place, but not time (Knows its December but does not know date or day of week).   Labs and Imaging: CBC BMET   Recent Labs Lab 08/12/14 1159  WBC 17.6*  HGB 12.6*  HCT 36.9*  PLT 199    Recent Labs Lab 08/12/14 1159  NA 135  K 3.6  CL 99  CO2 27  BUN 13  CREATININE 1.26  GLUCOSE 238*  CALCIUM 8.8     Dg Chest 2 View  08/12/2014    IMPRESSION: New mild infiltrate in the left mid lung superimposed over more chronic interstitial changes.   Electronically Signed   By: Inez Catalina M.D.   On: 08/12/2014 12:53   Dg Cervical Spine 1 View  08/12/2014    IMPRESSION: Stable shunt tubing in the neck.   Electronically Signed   By: Lars Pinks M.D.   On: 08/12/2014 12:54   Dg Abd 1 View  08/12/2014    IMPRESSION: Shunt tubing terminating in the right lower quadrant with no adverse features identified.   Electronically Signed   By: Lars Pinks M.D.   On: 08/12/2014 12:55   Ct Head Wo Contrast  08/12/2014    IMPRESSION: No appreciable change from prior study. Ventricles are decompressed.  Note that the shunt catheter tip is in the medial right lentiform nucleus, stable. The degree of decreased attenuation in both frontal white matter regions is stable as is mild patchy periventricular small vessel disease elsewhere. No acute appearing infarct is seen. No hemorrhage or mass effect.  The possibility of CSF overdrainage must remain of concern given the overall appearance, in particular to the small size of the lateral ventricles.   Electronically Signed   By: Lowella Grip M.D.   On: 08/12/2014 14:22   Lorna Few, DO 08/12/2014, 3:05 PM PGY-1, [place] Intern pager: [phone], text pages welcome   I have seen and evaluated  the pt with Dr. Gerlean Ren.  Please refer to her excellent H&P for more information.  My additions are in red.  Tamela Oddi Awanda Mink, DO of [facility] 08/12/2014, 4:33 PM

## 2014-08-12 NOTE — ED Provider Notes (Signed)
CSN: [id number]     Arrival date & time 08/12/14  1110 History   First MD Initiated Contact with Patient 08/12/14 1132     Chief Complaint  Patient presents with  . Headache     (Consider location/radiation/quality/duration/timing/severity/associated sxs/prior Treatment) Patient is a 58 y.o. male presenting with headaches. The history is provided by the patient.  Headache Associated symptoms: fever   Associated symptoms: no abdominal pain, no back pain, no cough, no diarrhea, no pain, no nausea, no neck stiffness, no numbness and no vomiting    patient presents with fever and some confusion. Reportedly has been doing worse at home. Has a history of VP shunt after previous stroke. Also has frequent pneumonias. Has had a little bit of a cough. Has had headache. Family states he became more confused. Has had a little bit of a cough. VP shunt has been malfunctioning and may need replacement. Sees Dr Christella Noa.  Past Medical History  Diagnosis Date  . Altered mental status   . Pneumonia, organism unspecified   . Memory loss   . Osteoporosis, unspecified     bone density per Morivati in 2012  . Iodine hypothyroidism   . Anxiety state, unspecified   . Pure hypercholesterolemia   . Hematuria 03/16/2007  . Allergic rhinitis, cause unspecified   . Alcohol use 03/16/2007    patient risk factors  . Esophageal reflux   . Tobacco use disorder 03/16/2007    patient risk factors  . 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 Cervicalgia 09/17/2007  . Diplopia 12/21/2007  . Other abnormal glucose 04/08/2008  . Personal history of colonic polyps   . Other diseases of lung, not elsewhere classified 01/23/2009    s/p Fleming/pulmonology consult; intolerant  to Advair  . Gastritis 05/13/2009  . Peripheral vascular disease, unspecified 12/24/2009  . Unspecified hypothyroidism 01/07/2010  . NSIP (nonspecific interstitial pneumonia)     on azathioprine  . Hyperthyroidism      graves disease  . COPD (chronic obstructive pulmonary disease)   . Shortness of breath   . Stroke 2005   Past Surgical History  Procedure Laterality Date  . Eye surgery  2010  . 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 Admission 11/2011      altered mental status/confusion with syncope.  CT head negative, MRI brain negative, EEG: diffuse slowing but no  seizure acitivity.  Labs negative.  [facility].  Marland Kitchen Neuropsychiatric evaluation  04/2012    poor short-term memory, poor recall, delayed reaction time; permanently disabled.    . Laparoscopic revision ventricular-peritoneal (v-p) shunt Right 05/20/2014    Procedure: LAPAROSCOPIC REVISION VENTRICULAR-PERITONEAL (V-P) SHUNT;  Surgeon: Georganna Skeans, MD;  Location: [facility];  Service: General;  Laterality: Right;   Family History  Problem Relation Age of Onset  . Heart disease Father     cad, chf  . Cancer Father   . Heart disease Brother     open heart surgery  . Diabetes Brother   . Cancer Brother   . Heart disease Mother     cad  . Diabetes Mother   . Stroke Mother    History  Substance Use Topics  . Smoking status: Current Every Day Smoker -- 1.00 packs/day for 30 years  . Smokeless tobacco: Not on file  . Alcohol Use: No    Review of Systems  Constitutional: Positive for fever. Negative for activity change and appetite change.  Eyes: Negative for pain.  Respiratory: Positive for shortness of breath. Negative for cough and chest tightness.   Cardiovascular: Negative for chest pain and leg swelling.  Gastrointestinal: Negative for nausea, vomiting, abdominal pain and diarrhea.  Genitourinary: Negative for flank pain.  Musculoskeletal: Negative for back pain and neck stiffness.  Skin: Negative for rash.  Neurological: Positive for headaches. Negative for weakness and numbness.  Psychiatric/Behavioral: Positive for confusion. Negative for behavioral problems.      Allergies  Erythromycin; Iodinated diagnostic  agents; Advair diskus; Darvocet; Doxycycline hyclate; and Septra  Home Medications   Prior to Admission medications   Medication Sig Start Date End Date Taking? Authorizing Provider  atorvastatin (LIPITOR) 20 MG tablet Take 20 mg by mouth daily.   Yes Historical Provider, MD  diazepam (VALIUM) 5 MG tablet Take 5 mg by mouth daily. 04/25/14  Yes Historical Provider, MD  morphine (MS CONTIN) 100 MG 12 hr tablet Take 100 mg by mouth every 8 (eight) hours.   Yes Historical Provider, MD  omeprazole (PRILOSEC) 20 MG capsule Take 1 capsule (20 mg total) by mouth daily. 02/26/14  Yes [name], MD  predniSONE (DELTASONE) 10 MG tablet Take 25 mg by mouth daily with breakfast.   Yes Historical Provider, MD  sertraline (ZOLOFT) 100 MG tablet Take 1.5 tablets (150 mg total) by mouth daily. 02/26/14  Yes [name], MD  Tapentadol HCl (NUCYNTA) 75 MG TABS Take 1 tablet by mouth every 4 (four) hours as needed (Take every 4-6 hours as needed for pain).   Yes Historical Provider, MD  thyroid (ARMOUR) 90 MG tablet Take 90 mg by mouth daily.   Yes Historical Provider, MD  tiotropium (SPIRIVA) 18 MCG inhalation capsule Place 18 mcg into inhaler and inhale daily.   Yes Historical Provider, MD  Vitamin D, Ergocalciferol, (DRISDOL) 50000 UNITS CAPS Take 50,000 Units by mouth every Sunday.    Yes Historical Provider, MD  zoledronic acid (RECLAST) 5 MG/100ML SOLN injection Inject 5 mg into the vein See admin instructions. Given once per year    Historical Provider, MD   BP 107/60 mmHg  Pulse 71  Temp(Src) 99 F (37.2 C) (Rectal)  Resp 20  Wt 167 lb (75.751 kg)  SpO2 95% Physical Exam  Constitutional: He is oriented to person, place, and time. He appears well-developed and well-nourished.  HENT:  Shunt on top of head and courses to left.   Eyes:  Pupils mildly dilated, but reactive  Neck: Neck supple.  Cardiovascular: Normal rate and regular rhythm.   Pulmonary/Chest: Effort normal.  Abdominal: Soft.   Musculoskeletal: Normal range of motion.  Neurological: He is alert and oriented to person, place, and time.  Skin: Skin is warm.    ED Course  Procedures (including critical care time) Labs Review Labs Reviewed  CBC WITH DIFFERENTIAL - Abnormal; Notable for the following:    WBC 17.6 (*)    Hemoglobin 12.6 (*)    HCT 36.9 (*)    Neutrophils Relative % 91 (*)    Neutro Abs 16.1 (*)    Lymphocytes Relative 3 (*)    Lymphs Abs 0.5 (*)    All other components within normal limits  BASIC METABOLIC PANEL - Abnormal; Notable for the following:    Glucose, Bld 238 (*)    GFR calc non Af Amer 62 (*)    GFR calc Af Amer 72 (*)    All other components within normal limits  URINALYSIS, ROUTINE W REFLEX MICROSCOPIC - Abnormal; Notable for the following:  Hgb urine dipstick TRACE (*)    All other components within normal limits  URINE MICROSCOPIC-ADD ON - Abnormal; Notable for the following:    Casts HYALINE CASTS (*)    All other components within normal limits  CULTURE, BLOOD (ROUTINE X 2)  CULTURE, BLOOD (ROUTINE X 2)  CBC WITH DIFFERENTIAL    Imaging Review Dg Skull 1-3 Views  08/12/2014   CLINICAL DATA:  58 year old male with fever and ventriculostomy shunt. Initial encounter.  EXAM: SKULL - 1-3 VIEW  COMPARISON:  Head CT without contrast 05/23/2014 and earlier.  FINDINGS: Left frontal approach ventriculostomy catheter re- identified. Stable radiographic appearance of this and the partially radiolucent rest or located along the left convexity. Associated shunt tubing which tracks caudally in the left neck and into the chest appears stable and intact. No acute osseous abnormality identified.  IMPRESSION: Stable left frontal approach ventriculostomy and visible shunt. No adverse features identified.   Electronically Signed   By: Lars Pinks M.D.   On: 08/12/2014 12:56   Dg Chest 2 View  08/12/2014   CLINICAL DATA:  Known ventriculoperitoneal shunt, shortness of breath and fever   EXAM: CHEST  2 VIEW  COMPARISON:  05/18/2014  FINDINGS: Cardiac shadow is stable. A left-sided ventriculoperitoneal shunt is again noted. Diffuse interstitial changes are seen. Some new patchy infiltrate is noted in the left mid lung laterally. No other focal infiltrate is seen.  IMPRESSION: New mild infiltrate in the left mid lung superimposed over more chronic interstitial changes.   Electronically Signed   By: Inez Catalina M.D.   On: 08/12/2014 12:53   Dg Cervical Spine 1 View  08/12/2014   CLINICAL DATA:  58 year old male with fever and ventriculostomy shunt. Initial encounter.  EXAM: DG CERVICAL SPINE - 1 VIEW  COMPARISON:  Neck radiographs 05/12/2014.  FINDINGS: Lateral view of the neck. Stable visualized shunt tubing coursing inferiorly obliquely over the cervical spine. No discontinuity identified. Tubing continues into the chest. Superimposed external oxygen tubing. Stable visualized osseous structures.  IMPRESSION: Stable shunt tubing in the neck.   Electronically Signed   By: Lars Pinks M.D.   On: 08/12/2014 12:54   Dg Abd 1 View  08/12/2014   CLINICAL DATA:  58 year old male with fever and ventriculostomy shunt. Initial encounter.  EXAM: ABDOMEN - 1 VIEW  COMPARISON:  05/18/2014.  FINDINGS: Supine view at 1231 hrs. Shunt tubing enters the left upper quadrant and tracks to the right lower quadrant where it is mildly looped. No kinking or discontinuity identified. Non obstructed bowel gas pattern. No acute osseous abnormality identified.  IMPRESSION: Shunt tubing terminating in the right lower quadrant with no adverse features identified.   Electronically Signed   By: Lars Pinks M.D.   On: 08/12/2014 12:55   Ct Head Wo Contrast  08/12/2014   CLINICAL DATA:  Altered mental status/confusion  EXAM: CT HEAD WITHOUT CONTRAST  TECHNIQUE: Contiguous axial images were obtained from the base of the skull through the vertex without intravenous contrast.  COMPARISON:  May 23, 2014  FINDINGS: The patient  has a ventricular shunt catheter. The tip of the catheter is in the medial aspect of the right lentiform nucleus, stable. Note that the ventricles are not dilated and indeed are rather small. The left lateral ventricle is minimally more prominent than the right lateral ventricle, stable. There is no change in ventricular size or configuration since prior study. There is no intracranial mass, hemorrhage, extra-axial fluid collection, or midline shift. Decreased attenuation in  the anterior corona radiata extending to the gray-white compartments of each frontal lobe is a stable finding. Elsewhere there is slight periventricular small vessel disease in the centra semiovale bilaterally. No new gray-white compartment lesions are identified. The bony calvarium appears intact. Visualized mastoid air cells are clear.  IMPRESSION: No appreciable change from prior study. Ventricles are decompressed. Note that the shunt catheter tip is in the medial right lentiform nucleus, stable. The degree of decreased attenuation in both frontal white matter regions is stable as is mild patchy periventricular small vessel disease elsewhere. No acute appearing infarct is seen. No hemorrhage or mass effect.  The possibility of CSF overdrainage must remain of concern given the overall appearance, in particular to the small size of the lateral ventricles.   Electronically Signed   By: Lowella Grip M.D.   On: 08/12/2014 14:22     EKG Interpretation None      MDM   Final diagnoses:  Fever  HCAP (healthcare-associated pneumonia)    Patient with fever. HCAP on xray. Doubt infected shunt. Will admit.    Jasper Riling. Alvino Chapel, MD 08/12/14 1510

## 2014-08-12 NOTE — ED Notes (Signed)
Gave pt urinal informed him we need urine

## 2014-08-13 DIAGNOSIS — G919 Hydrocephalus, unspecified: Secondary | ICD-10-CM

## 2014-08-13 DIAGNOSIS — R410 Disorientation, unspecified: Secondary | ICD-10-CM

## 2014-08-13 LAB — CBC WITH DIFFERENTIAL/PLATELET
Basophils Absolute: 0 10*3/uL (ref 0.0–0.1)
Basophils Relative: 0 % (ref 0–1)
Eosinophils Absolute: 0.1 10*3/uL (ref 0.0–0.7)
Eosinophils Relative: 1 % (ref 0–5)
HCT: 35.3 % — ABNORMAL LOW (ref 39.0–52.0)
Hemoglobin: 11.7 g/dL — ABNORMAL LOW (ref 13.0–17.0)
Lymphocytes Relative: 13 % (ref 12–46)
Lymphs Abs: 1.4 10*3/uL (ref 0.7–4.0)
MCH: 29.3 pg (ref 26.0–34.0)
MCHC: 33.1 g/dL (ref 30.0–36.0)
MCV: 88.5 fL (ref 78.0–100.0)
Monocytes Absolute: 1 10*3/uL (ref 0.1–1.0)
Monocytes Relative: 9 % (ref 3–12)
Neutro Abs: 8.7 10*3/uL — ABNORMAL HIGH (ref 1.7–7.7)
Neutrophils Relative %: 77 % (ref 43–77)
Platelets: 200 10*3/uL (ref 150–400)
RBC: 3.99 MIL/uL — ABNORMAL LOW (ref 4.22–5.81)
RDW: 14.2 % (ref 11.5–15.5)
WBC: 11.2 10*3/uL — ABNORMAL HIGH (ref 4.0–10.5)

## 2014-08-13 LAB — BASIC METABOLIC PANEL
Anion gap: 4 — ABNORMAL LOW (ref 5–15)
BUN: 10 mg/dL (ref 6–23)
CO2: 30 mmol/L (ref 19–32)
Calcium: 7.8 mg/dL — ABNORMAL LOW (ref 8.4–10.5)
Chloride: 106 mEq/L (ref 96–112)
Creatinine, Ser: 0.85 mg/dL (ref 0.50–1.35)
GFR calc Af Amer: >90 mL/min (ref >90)
GFR calc non Af Amer: >90 mL/min (ref >90)
Glucose, Bld: 170 mg/dL — ABNORMAL HIGH (ref 70–99)
Potassium: 4.1 mmol/L (ref 3.5–5.1)
Sodium: 140 mmol/L (ref 135–145)

## 2014-08-13 LAB — INFLUENZA PANEL BY PCR (TYPE A & B)
H1N1 flu by pcr: NOT DETECTED
Influenza A By PCR: NEGATIVE
Influenza B By PCR: NEGATIVE

## 2014-08-13 MED ORDER — MORPHINE SULFATE ER 100 MG PO TBCR: 100.0000 mg | EXTENDED_RELEASE_TABLET | Freq: Two times a day (BID) | ORAL | Status: DC

## 2014-08-13 MED ORDER — MORPHINE SULFATE ER 100 MG PO TBCR: 100.0000 mg | EXTENDED_RELEASE_TABLET | Freq: Three times a day (TID) | ORAL | Status: DC

## 2014-08-13 MED ORDER — MORPHINE SULFATE ER 100 MG PO TBCR
100.0000 mg | EXTENDED_RELEASE_TABLET | Freq: Two times a day (BID) | ORAL | Status: DC
Start: 2014-08-13 — End: 2014-08-14
  Administered 2014-08-13 – 2014-08-14 (×3): 100 mg via ORAL
  Filled 2014-08-13 (×3): qty 1

## 2014-08-13 MED ORDER — TAPENTADOL HCL 50 MG PO TABS
75.0000 mg | ORAL_TABLET | ORAL | Status: DC | PRN
  Administered 2014-08-13 – 2014-08-14 (×3): 75 mg via ORAL
  Filled 2014-08-13 (×3): qty 2

## 2014-08-13 NOTE — Progress Notes (Signed)
UR Completed.  [phone]  

## 2014-08-13 NOTE — Progress Notes (Signed)
Pt a/o, receiving scheduled pain meds and tolerating pain well, pt in recliner, vss, pt stable

## 2014-08-13 NOTE — Evaluation (Signed)
Physical Therapy Evaluation Patient Details Name: Lavalle Skoda MRN: [id number] DOB: 1955-09-29 Today's Date: 08/13/2014   History of Present Illness  58 Y/O M with PMX of COPD, Hypothyroidism, pulmonary fibrosis,recurrent PNA, HLD, Depression presented with hx of fever , headache and worsening confusion over the last 3 days. Per his wife, he had similar presentation few months back in Sept where he was admitted for PNA and Ventriculoperitoneal shunt malfunction. His wife was concern that he might be having issue with his shunt again or PNA hence brought him back to the hospital.  Clinical Impression  Pt admitted with above diagnosis. Pt currently with functional limitations due to the deficits listed below (see PT Problem List). Pt ambulated 40' with min A and RW, limited by back pain. Usually uses no AD or cane, RW used for safety and pain mgmt.  Pt will benefit from skilled PT to increase their independence and safety with mobility to allow discharge to the venue listed below.       Follow Up Recommendations No PT follow up    Equipment Recommendations  None recommended by PT    Recommendations for Other Services       Precautions / Restrictions Precautions Precautions: Fall Precaution Comments: pt reports that he falls often at home and uses cane to get back up Restrictions Weight Bearing Restrictions: No      Mobility  Bed Mobility Overal bed mobility: Needs Assistance Bed Mobility: Supine to Sit     Supine to sit: Min assist     General bed mobility comments: needed assistance to get covers off self but was then able to achieve sitting position without assist  Transfers Overall transfer level: Needs assistance Equipment used: Rolling walker (2 wheeled) Transfers: Sit to/from [facility] Sit to Stand: Min guard Stand pivot transfers: Min guard       General transfer comment: min-guard given for safety due to pt's pain and fall history, no LOB  with standing.   Ambulation/Gait Ambulation/Gait assistance: Min assist Ambulation Distance (Feet): 40 Feet Assistive device: Rolling walker (2 wheeled) Gait Pattern/deviations: Step-through pattern;Staggering left;Staggering right Gait velocity: decreased   General Gait Details: pt with stiff gait, slight unsteadiness so used RW for safety inctead of cane, encouraged pt in mobility for mgmt of back pain  Stairs            Wheelchair Mobility    Modified Rankin (Stroke Patients Only)       Balance Overall balance assessment: Needs assistance Sitting-balance support: No upper extremity supported Sitting balance-Leahy Scale: Good     Standing balance support: No upper extremity supported;During functional activity Standing balance-Leahy Scale: Fair                               Pertinent Vitals/Pain Pain Assessment: Faces Faces Pain Scale: Hurts whole lot Pain Location: back and all over Pain Descriptors / Indicators: Aching Pain Intervention(s): Monitored during session;Patient requesting pain meds-RN notified    Home Living Family/patient expects to be discharged to:: Private residence Living Arrangements: Spouse/significant other Available Help at Discharge: Family;Available PRN/intermittently Type of Home: House Home Access: Stairs to enter Entrance Stairs-Rails: Right;Left;Can reach both Entrance Stairs-Number of Steps: 6 Home Layout: One level Home Equipment: Walker - 2 wheels;Cane - single point;Wheelchair - manual;Shower seat;Hand held shower head;Bedside commode Additional Comments: pt's wife reports that pt has chronic pain which is currently higher than normal as morphine has been held for  pulmonary concerns    Prior Function Level of Independence: Needs assistance   Gait / Transfers Assistance Needed: cane intermittently, falls  ADL's / Homemaking Assistance Needed: reports that wife helps as needed, wife performs executive functions in  home as pt with cognitive issues at baseline  Comments: VP shunt since CVA in 2005     Hand Dominance   Dominant Hand: Right    Extremity/Trunk Assessment   Upper Extremity Assessment: Overall WFL for tasks assessed           Lower Extremity Assessment: Generalized weakness      Cervical / Trunk Assessment: Kyphotic  Communication   Communication: No difficulties  Cognition Arousal/Alertness: Awake/alert Behavior During Therapy: Restless;Impulsive Overall Cognitive Status: Impaired/Different from baseline Area of Impairment: Memory               General Comments: pt confused today. At first reported that he does not normally have back pain and attributes it to hospital bed, pt's wife came in later and discussed chronic pain issues and pt's current confusion    General Comments      Exercises        Assessment/Plan    PT Assessment Patient needs continued PT services  PT Diagnosis Difficulty walking;Abnormality of gait;Acute pain;Generalized weakness   PT Problem List Decreased strength;Decreased activity tolerance;Decreased balance;Decreased mobility;Decreased coordination;Decreased safety awareness;Pain  PT Treatment Interventions DME instruction;Gait training;Stair training;Functional mobility training;Therapeutic activities;Therapeutic exercise;Balance training;Patient/family education   PT Goals (Current goals can be found in the Care Plan section) Acute Rehab PT Goals Patient Stated Goal: return home PT Goal Formulation: With patient/family Time For Goal Achievement: 08/27/14 Potential to Achieve Goals: Good    Frequency Min 3X/week   Barriers to discharge        Co-evaluation               End of Session Equipment Utilized During Treatment: Gait belt;Oxygen Activity Tolerance: Patient limited by pain Patient left: in chair;with chair alarm set;with call bell/phone within reach;with family/visitor present Nurse Communication: Mobility  status;Patient requests pain meds         Time: 2409-7353 PT Time Calculation (min) (ACUTE ONLY): 23 min   Charges:   PT Evaluation $Initial PT Evaluation Tier I: 1 Procedure PT Treatments $Gait Training: 8-22 mins   PT G Codes:        Leighton Roach, PT  Acute Rehab Services  [place], [place] 08/13/2014, 11:00 AM

## 2014-08-13 NOTE — Progress Notes (Signed)
Inpatient Diabetes Program Recommendations  AACE/ADA: New Consensus Statement on Inpatient Glycemic Control (2013)  Target Ranges:  Prepandial:   less than 140 mg/dL      Peak postprandial:   less than 180 mg/dL (1-2 hours)      Critically ill patients:  140 - 180 mg/dL   Reason for Assessment:  Results for [name], [name] (MRN [id number]) as of 08/13/2014 09:59  Ref. Range 08/12/2014 11:59 08/13/2014 04:50  Glucose No range found 238 (H) 170 (H)   Diabetes history: No history   Note elevated glucose with no history of diabetes.  Consider checking CBG's tid with meals and HS while patient is in the hospital.  If CBG's greater than 140 mg/dL, consider adding Novolog correction moderate tid with meals and HS scale.    Thanks, Adah Perl, RN, BC-ADM Inpatient Diabetes Coordinator Pager [phone]

## 2014-08-13 NOTE — Evaluation (Signed)
Occupational Therapy Evaluation Patient Details Name: Kirk Robinson MRN: [id number] DOB: 1955-10-15 Today's Date: 08/13/2014    History of Present Illness 58 Y/O M with PMX of COPD, Hypothyroidism, pulmonary fibrosis,recurrent PNA, HLD, Depression presented with hx of fever , headache and worsening confusion over the last 3 days. Per his wife, he had similar presentation few months back in Sept where he was admitted for PNA and Ventriculoperitoneal shunt malfunction. His wife was concern that he might be having issue with his shunt again or PNA hence brought him back to the hospital.   Clinical Impression   Pt currently presents at a modified independent level for simulated selfcare tasks as well as toilet transfers.  Pt with no difficulty during OT session with sit to stand or mobility to the bathroom.  He had been pre-medicated prior to session and was not noted to have the restlessness or severe pain reported during PT session.  Pt with no OT needs at this time.  He does demonstrate visual occulomotor deficits which were present from previous CVA as well as baseline cognitive deficits.      Follow Up Recommendations  No OT follow up    Equipment Recommendations  None recommended by OT    Recommendations for Other Services       Precautions / Restrictions Precautions Precautions: Fall Precaution Comments: pt reports that he falls often at home and uses cane to get back up Restrictions Weight Bearing Restrictions: No      Mobility Bed Mobility Overal bed mobility: Needs Assistance Bed Mobility: Supine to Sit     Supine to sit: Min assist     General bed mobility comments: needed assistance to get covers off self but was then able to achieve sitting position without assist  Transfers Overall transfer level: Modified independent Equipment used:  (Pt pushed the IV pole to the bathroom) Transfers: Sit to/from Stand Sit to Stand: Modified independent (Device/Increase  time);From elevated surface (from toilet with grab bar) Stand pivot transfers: Min guard       General transfer comment: min-guard given for safety due to pt's pain and fall history, no LOB with standing.     Balance Overall balance assessment: Needs assistance Sitting-balance support: No upper extremity supported Sitting balance-Leahy Scale: Good     Standing balance support: No upper extremity supported;During functional activity Standing balance-Leahy Scale: Good                              ADL Overall ADL's : At baseline                                       General ADL Comments: Pt currently modified indepedent with basic selfcare tasks, toileting, and transfers during OT session.  Pt had received his pain medication prior to session so did not exhibit anxiousness or any LOB during toileting task.      Vision Eye Alignment: Impaired (comment)   Ocular Range of Motion: Restricted looking up Tracking/Visual Pursuits: Decreased smoothness of horizontal tracking;Decreased smoothness of eye movement to LEFT superior field;Decreased smoothness of eye movement to RIGHT superior field;Unable to hold eye position out of midline (Pt with decreased ability to hold or maintain superior visual scanning. )     Diplopia Assessment: Other (comment) (present with superior gaze)       Perception Perception Perception Tested?: No  Praxis Praxis Praxis tested?: Not tested    Pertinent Vitals/Pain Pain Assessment: Faces Faces Pain Scale: Hurts a little bit Pain Location: back and all over Pain Descriptors / Indicators: Aching;Shooting Pain Intervention(s): Limited activity within patient's tolerance     Hand Dominance Right   Extremity/Trunk Assessment Upper Extremity Assessment Upper Extremity Assessment: Overall WFL for tasks assessed   Lower Extremity Assessment Lower Extremity Assessment: Defer to PT evaluation   Cervical / Trunk  Assessment Cervical / Trunk Assessment: Kyphotic   Communication Communication Communication: No difficulties   Cognition Arousal/Alertness: Awake/alert Behavior During Therapy: Flat affect Overall Cognitive Status:  (Pt with history of cognitive impairment from previous CVA.) Area of Impairment: Memory               General Comments: pt confused today. At first reported that he does not normally have back pain and attributes it to hospital bed, pt's wife came in later and discussed chronic pain issues and pt's current confusion              Home Living Family/patient expects to be discharged to:: Private residence Living Arrangements: Spouse/significant other Available Help at Discharge: Family;Available PRN/intermittently Type of Home: House Home Access: Stairs to enter [employer] of Steps: 6 Entrance Stairs-Rails: Right;Left;Can reach both Home Layout: One level     Bathroom Shower/Tub: Tub/shower unit Shower/tub characteristics: Curtain Biochemist, clinical: Standard Bathroom Accessibility: Yes   Home Equipment: Environmental consultant - 2 wheels;Cane - single point;Wheelchair - manual;Shower seat;Hand held shower head;Bedside commode   Additional Comments: pt's wife reports that pt has chronic pain which is currently higher than normal as morphine has been held for pulmonary concerns      Prior Functioning/Environment Level of Independence: Needs assistance  Gait / Transfers Assistance Needed: cane intermittently, falls ADL's / Homemaking Assistance Needed: reports that wife helps as needed, wife performs executive functions in home as pt with cognitive issues at baseline   Comments: VP shunt since CVA in 2005             OT Goals(Current goals can be found in the care plan section) Acute Rehab OT Goals Patient Stated Goal: return home                End of Session Nurse Communication: Mobility status  Activity Tolerance: Patient tolerated treatment  well Patient left: in chair;with call bell/phone within reach;with family/visitor present   Time: 1134-1155 OT Time Calculation (min): 21 min Charges:  OT General Charges $OT Visit: 1 Procedure OT Evaluation $Initial OT Evaluation Tier I: 1 Procedure Kirk Robinson OTR/L 08/13/2014, 12:53 PM

## 2014-08-13 NOTE — Progress Notes (Signed)
Family Medicine Teaching Service Daily Progress Note Intern Pager: [phone]  Patient name: Kirk Robinson Medical record number: [id number] Date of birth: [birth year] Age: 58 y.o. Gender: male  Primary Care Provider: Reginia Forts, MD Consultants: Neurosurgery Code Status: FULL  Pt Overview and Major Events to Date:  12/23: Mental status back to baseline  Assessment and Plan: Kirk Robinson is a 58 y.o. male presenting with fevers and altered mental status. PMH is significant for hydrocephalus s/p VP shunt, Chronic Pain Syndrome, GERD, pulmonary fibrosis, hyperlipidemia, depression, COPD, hypothyroidism s/p radioiodine therapy, anxiety, and tobacco abuse.  # SIRS/HCAP- Presented with subjective fevers and AMS. CXR showed new infiltrate, left mid-lung superimposed over more chronic interstitial changes. Has history of recurrent pneumonia in past.  Procalcitonin 0.14. Leukocytosis improving 17.6>>11.2. Continues to be afebrile. Hemodynamically stable. Prelim blood cx NGTD - Blood Cultures obtained x2 - Strep pneumo urinary antigen, legionella needs to be collected - Influenza PCR pending - Monitor pulse oximetry - Vanc/Cefipime per pharmacy   # Toxic Metabolic Encephalopathy secondary to HCAP: resolved - CT- No change from prior study. Shunt tip stable. Concern for CSF overdrainage   - History of VP shunt placement.  - Neurosurgery consulted. Appreciate recommendations.  # AKI: Baseline Creatinine 0.6-0.8. Most likely secondary to dehydration. Improved: Creatinine 0.85 today. - Urinalysis showed hyaline casts - NS @125  cc/hr - Avoid Nephrotoxic medications   # Hypocalcemia: Ca 7.8 this morning. - replete with Calcium carbonate  - Repeat CMET in am, Mg level ordered  # Hyperlipidemia: Home medication Atorvastatin 20mg  - Continue home medication  # Depression/Anxiety: Home medications Valium 5mg , Sertraline 150mg  - Continue home medications  # Chronic Pain: Home medication MS  Contin 12hr 100mg  TID, Nucynta 75mg  q4-6hr PRN. Mental status improved - Continue home medications (MS Contin restarted today) - Monitor    # GERD: Home medication Omeprazole 20mg   # Hypothyroidism s/p Radioiodine Therapy: Home medication Armour 90mg  - Continue home medication  # COPD: Home medication Spiriva 49mcg - Continue home medication  # Osteoporosis- Home medications Reclast, Drisdol  FEN/GI: NS @ 128mL/hr, Heart healthy diet. Prophylaxis: Lovenox  Disposition: Discharge pending continued improvement in symptoms and results of cultures for narrowing of abx.  Subjective:  Patient reports that he is breathing fine today.  He is AAOx3.  Per wife mental status is baseline.  However, patient is complaining of back pain.  Wife is asking that we restart his MS Contin.  Patient denies SOB, CP, diarrhea, constipation, n/v.    Objective: Temp:  [97.4 F (36.3 C)-99 F (37.2 C)] 97.9 F (36.6 C) (12/23 0525) Pulse Rate:  [56-77] 63 (12/23 0525) Resp:  [11-24] 16 (12/23 0525) BP: (97-118)/(60-97) 106/75 mmHg (12/23 0525) SpO2:  [95 %-99 %] 98 % (12/23 0525) Weight:  [167 lb (75.751 kg)-167 lb 15.9 oz (76.2 kg)] 167 lb 15.9 oz (76.2 kg) (12/22 2131) Physical Exam: General: awake, alert, well nourished male, NAD, wife at bedside Cardiovascular: RRR, no m/r/g, brisk cap refill Respiratory: CTAB anteriorly (patient unable to sit up d/t back pain), no wheeze appreciated Abdomen: soft, NT/ND, +BS Extremities: WWP, no cyanosis, clubbing or edema MSK: ROM limited by pain Neuro: AAOx3, no focal deficits, follows commands. Psych: flat affect, stable mood, poor eye contact  Laboratory:  Recent Labs Lab 08/12/14 1159 08/13/14 0450  WBC 17.6* 11.2*  HGB 12.6* 11.7*  HCT 36.9* 35.3*  PLT 199 200    Recent Labs Lab 08/12/14 1159 08/13/14 0450  NA 135 140  K 3.6 4.1  CL  99 106  CO2 27 30  BUN 13 10  CREATININE 1.26 0.85  CALCIUM 8.8 7.8*  GLUCOSE 238* 170*   C diff  pending Flu pending Procalcitonin 0.14  Imaging/Diagnostic Tests: Dg Skull 1-3 Views  08/12/2014   CLINICAL DATA:  58 year old male with fever and ventriculostomy shunt. Initial encounter.  EXAM: SKULL - 1-3 VIEW  COMPARISON:  Head CT without contrast 05/23/2014 and earlier.  FINDINGS: Left frontal approach ventriculostomy catheter re- identified. Stable radiographic appearance of this and the partially radiolucent rest or located along the left convexity. Associated shunt tubing which tracks caudally in the left neck and into the chest appears stable and intact. No acute osseous abnormality identified.  IMPRESSION: Stable left frontal approach ventriculostomy and visible shunt. No adverse features identified.   Electronically Signed   By: Lars Pinks M.D.   On: 08/12/2014 12:56   Dg Chest 2 View  08/12/2014   CLINICAL DATA:  Known ventriculoperitoneal shunt, shortness of breath and fever  EXAM: CHEST  2 VIEW  COMPARISON:  05/18/2014  FINDINGS: Cardiac shadow is stable. A left-sided ventriculoperitoneal shunt is again noted. Diffuse interstitial changes are seen. Some new patchy infiltrate is noted in the left mid lung laterally. No other focal infiltrate is seen.  IMPRESSION: New mild infiltrate in the left mid lung superimposed over more chronic interstitial changes.   Electronically Signed   By: Inez Catalina M.D.   On: 08/12/2014 12:53   Dg Cervical Spine 1 View  08/12/2014   CLINICAL DATA:  58 year old male with fever and ventriculostomy shunt. Initial encounter.  EXAM: DG CERVICAL SPINE - 1 VIEW  COMPARISON:  Neck radiographs 05/12/2014.  FINDINGS: Lateral view of the neck. Stable visualized shunt tubing coursing inferiorly obliquely over the cervical spine. No discontinuity identified. Tubing continues into the chest. Superimposed external oxygen tubing. Stable visualized osseous structures.  IMPRESSION: Stable shunt tubing in the neck.   Electronically Signed   By: Lars Pinks M.D.   On: 08/12/2014  12:54   Dg Abd 1 View  08/12/2014   CLINICAL DATA:  58 year old male with fever and ventriculostomy shunt. Initial encounter.  EXAM: ABDOMEN - 1 VIEW  COMPARISON:  05/18/2014.  FINDINGS: Supine view at 1231 hrs. Shunt tubing enters the left upper quadrant and tracks to the right lower quadrant where it is mildly looped. No kinking or discontinuity identified. Non obstructed bowel gas pattern. No acute osseous abnormality identified.  IMPRESSION: Shunt tubing terminating in the right lower quadrant with no adverse features identified.   Electronically Signed   By: Lars Pinks M.D.   On: 08/12/2014 12:55   Ct Head Wo Contrast  08/12/2014   CLINICAL DATA:  Altered mental status/confusion  EXAM: CT HEAD WITHOUT CONTRAST  TECHNIQUE: Contiguous axial images were obtained from the base of the skull through the vertex without intravenous contrast.  COMPARISON:  May 23, 2014  FINDINGS: The patient has a ventricular shunt catheter. The tip of the catheter is in the medial aspect of the right lentiform nucleus, stable. Note that the ventricles are not dilated and indeed are rather small. The left lateral ventricle is minimally more prominent than the right lateral ventricle, stable. There is no change in ventricular size or configuration since prior study. There is no intracranial mass, hemorrhage, extra-axial fluid collection, or midline shift. Decreased attenuation in the anterior corona radiata extending to the gray-white compartments of each frontal lobe is a stable finding. Elsewhere there is slight periventricular small vessel disease in the  centra semiovale bilaterally. No new gray-white compartment lesions are identified. The bony calvarium appears intact. Visualized mastoid air cells are clear.  IMPRESSION: No appreciable change from prior study. Ventricles are decompressed. Note that the shunt catheter tip is in the medial right lentiform nucleus, stable. The degree of decreased attenuation in both frontal  white matter regions is stable as is mild patchy periventricular small vessel disease elsewhere. No acute appearing infarct is seen. No hemorrhage or mass effect.  The possibility of CSF overdrainage must remain of concern given the overall appearance, in particular to the small size of the lateral ventricles.   Electronically Signed   By: Lowella Grip M.D.   On: 08/12/2014 14:22    Janora Norlander, DO 08/13/2014, 6:48 AM PGY-1, [place] Intern pager: [phone], text pages welcome

## 2014-08-14 DIAGNOSIS — J8410 Pulmonary fibrosis, unspecified: Secondary | ICD-10-CM

## 2014-08-14 DIAGNOSIS — E780 Pure hypercholesterolemia: Secondary | ICD-10-CM

## 2014-08-14 LAB — CBC WITH DIFFERENTIAL/PLATELET
Basophils Absolute: 0 10*3/uL (ref 0.0–0.1)
Basophils Relative: 0 % (ref 0–1)
Eosinophils Absolute: 0.1 10*3/uL (ref 0.0–0.7)
Eosinophils Relative: 1 % (ref 0–5)
HCT: 37.1 % — ABNORMAL LOW (ref 39.0–52.0)
Hemoglobin: 12.5 g/dL — ABNORMAL LOW (ref 13.0–17.0)
Lymphocytes Relative: 23 % (ref 12–46)
Lymphs Abs: 1.7 10*3/uL (ref 0.7–4.0)
MCH: 29.7 pg (ref 26.0–34.0)
MCHC: 33.7 g/dL (ref 30.0–36.0)
MCV: 88.1 fL (ref 78.0–100.0)
Monocytes Absolute: 0.6 10*3/uL (ref 0.1–1.0)
Monocytes Relative: 8 % (ref 3–12)
Neutro Abs: 5.1 10*3/uL (ref 1.7–7.7)
Neutrophils Relative %: 68 % (ref 43–77)
Platelets: 209 10*3/uL (ref 150–400)
RBC: 4.21 MIL/uL — ABNORMAL LOW (ref 4.22–5.81)
RDW: 14.1 % (ref 11.5–15.5)
WBC: 7.5 10*3/uL (ref 4.0–10.5)

## 2014-08-14 LAB — BASIC METABOLIC PANEL
Anion gap: 6 (ref 5–15)
BUN: 6 mg/dL (ref 6–23)
CO2: 30 mmol/L (ref 19–32)
Calcium: 8.4 mg/dL (ref 8.4–10.5)
Chloride: 107 mEq/L (ref 96–112)
Creatinine, Ser: 0.83 mg/dL (ref 0.50–1.35)
GFR calc Af Amer: >90 mL/min (ref >90)
GFR calc non Af Amer: >90 mL/min (ref >90)
Glucose, Bld: 158 mg/dL — ABNORMAL HIGH (ref 70–99)
Potassium: 4.6 mmol/L (ref 3.5–5.1)
Sodium: 143 mmol/L (ref 135–145)

## 2014-08-14 LAB — MAGNESIUM: Magnesium: 2.2 mg/dL (ref 1.5–2.5)

## 2014-08-14 LAB — PROCALCITONIN: Procalcitonin: <0.1 ng/mL

## 2014-08-14 MED ORDER — LEVOFLOXACIN 750 MG PO TABS
750.0000 mg | ORAL_TABLET | Freq: Every day | ORAL | Status: DC
  Administered 2014-08-14: 750 mg via ORAL
  Filled 2014-08-14: qty 1

## 2014-08-14 MED ORDER — LEVOFLOXACIN IN D5W 750 MG/150ML IV SOLN
750.0000 mg | INTRAVENOUS | Status: DC
  Filled 2014-08-14: qty 150

## 2014-08-14 MED ORDER — LEVOFLOXACIN 750 MG PO TABS: 750.0000 mg | ORAL_TABLET | Freq: Every day | ORAL | Status: DC

## 2014-08-14 NOTE — Progress Notes (Signed)
Family Medicine Teaching Service Daily Progress Note Intern Pager: [phone]  Patient name: Kirk Robinson Medical record number: [id number] Date of birth: February 20, 1956 Age: 58 y.o. Gender: male  Primary Care Provider: Reginia Forts, MD Consultants: Neurosurgery Code Status: FULL  Pt Overview and Major Events to Date:  12/23: Mental status back to baseline  Assessment and Plan: Kirk Robinson is a 58 y.o. male presenting with fevers and altered mental status. PMH is significant for hydrocephalus s/p VP shunt, Chronic Pain Syndrome, GERD, pulmonary fibrosis, hyperlipidemia, depression, COPD, hypothyroidism s/p radioiodine therapy, anxiety, and tobacco abuse.  # SIRS/HCAP- Presented with subjective fevers and AMS. CXR showed new infiltrate, left mid-lung superimposed over more chronic interstitial changes. Has history of recurrent pneumonia in past.  Procalcitonin 0.14. Leukocytosis resolved 17.6>>11.2>7.5. Continues to be afebrile. Hemodynamically stable. Prelim blood cx NGTD (48 hour read at 12pm today). Flu negative - Strep pneumo urinary antigen, legionella needs to be collected - Monitor pulse oximetry - Vanc/Cefipime per pharmacy (12/22-).  Will transition to PO Levaquin 750mg  today   # Toxic Metabolic Encephalopathy secondary to HCAP: resolved - CT- No change from prior study. Shunt tip stable. Concern for CSF overdrainage   - History of VP shunt placement.  - Neurosurgery consulted. Appreciate recommendations. Has not seen patient yet.  Awaiting recs.  Per phone conversation on 12/23, provider did not feel that patient's AMS was from shunt as imaging showed stability.  Resolution of AMS with abx also reassuring.  Recommended f/u outpt if family agreeable.  Family is agreeable.  # AKI: Baseline Creatinine 0.6-0.8. Most likely secondary to dehydration. Improved: Creatinine 0.83 today. - Urinalysis showed hyaline casts - NS @125  cc/hr - Avoid Nephrotoxic medications   #  Hypocalcemia: Ca 8.4 this morning. Mg 2.2 - monitor and replete PRN   # Hyperlipidemia: Home medication Atorvastatin 20mg  - Continue home medication  # Depression/Anxiety: Home medications Valium 5mg , Sertraline 150mg  - Continue home medications  # Chronic Pain: Home medication MS Contin 12hr 100mg  TID, Nucynta 75mg  q4-6hr PRN. Mental status improved. Patient with controlled pain today. - Continue home medications (MS Contin restarted 12/23 @ BID dosing) - Monitor    # GERD: Home medication Omeprazole 20mg   # Hypothyroidism s/p Radioiodine Therapy: Home medication Armour 90mg  - Continue home medication  # COPD: Home medication Spiriva 51mcg - Continue home medication  # Osteoporosis- Home medications Reclast, Drisdol  FEN/GI: NS @ 116mL/hr, Heart healthy diet. Prophylaxis: Lovenox  Disposition: Discharge pending tolerance of PO abx.  Likely discharge home this evening or tomorrow am.  Subjective:  Patient reports that he is doing much better today.  His pain is better controlled.  He reports good movement and ambulation; his wife endorses this.  He denies any CP, SOB, N/V, Abdominal pain, headache.  His wife also states patient is at baseline.  Discussed possibility of seeing neuro outpatient.  Family wants to do this as AMS has resolved with abx and they are eager to be discharged from hospital.  Objective: Temp:  [97.9 F (36.6 C)-98.4 F (36.9 C)] 98.2 F (36.8 C) (12/23 2140) Pulse Rate:  [63-71] 71 (12/23 2140) Resp:  [16] 16 (12/23 2140) BP: (106-131)/(71-75) 131/75 mmHg (12/23 2140) SpO2:  [98 %-100 %] 100 % (12/23 2140) Weight:  [167 lb 15.9 oz (76.2 kg)] 167 lb 15.9 oz (76.2 kg) (12/23 2140) Physical Exam: General: awake, alert, well nourished male, more bright appearing male sitting up in bedside chair, NAD, wife lying at bedside Cardiovascular: RRR, no m/r/g, brisk cap refill  Respiratory: CTAB, no wheeze appreciated, no increased WOB Abdomen: soft, NT/ND,  +BS Extremities: WWP, no cyanosis, clubbing or edema MSK: ROM limited by pain (but greatly improved compared to yesterday) Neuro: AAOx3, no focal deficits, follows commands. Psych: flat affect, stable mood, good eye contact  Laboratory:  Recent Labs Lab 08/12/14 1159 08/13/14 0450  WBC 17.6* 11.2*  HGB 12.6* 11.7*  HCT 36.9* 35.3*  PLT 199 200    Recent Labs Lab 08/12/14 1159 08/13/14 0450  NA 135 140  K 3.6 4.1  CL 99 106  CO2 27 30  BUN 13 10  CREATININE 1.26 0.85  CALCIUM 8.8 7.8*  GLUCOSE 238* 170*   C diff pending Flu pending Procalcitonin 0.14  Imaging/Diagnostic Tests: Dg Skull 1-3 Views  08/12/2014   CLINICAL DATA:  59 year old male with fever and ventriculostomy shunt. Initial encounter.  EXAM: SKULL - 1-3 VIEW  COMPARISON:  Head CT without contrast 05/23/2014 and earlier.  FINDINGS: Left frontal approach ventriculostomy catheter re- identified. Stable radiographic appearance of this and the partially radiolucent rest or located along the left convexity. Associated shunt tubing which tracks caudally in the left neck and into the chest appears stable and intact. No acute osseous abnormality identified.  IMPRESSION: Stable left frontal approach ventriculostomy and visible shunt. No adverse features identified.   Electronically Signed   By: Lars Pinks M.D.   On: 08/12/2014 12:56   Dg Chest 2 View  08/12/2014   CLINICAL DATA:  Known ventriculoperitoneal shunt, shortness of breath and fever  EXAM: CHEST  2 VIEW  COMPARISON:  05/18/2014  FINDINGS: Cardiac shadow is stable. A left-sided ventriculoperitoneal shunt is again noted. Diffuse interstitial changes are seen. Some new patchy infiltrate is noted in the left mid lung laterally. No other focal infiltrate is seen.  IMPRESSION: New mild infiltrate in the left mid lung superimposed over more chronic interstitial changes.   Electronically Signed   By: Inez Catalina M.D.   On: 08/12/2014 12:53   Dg Cervical Spine 1  View  08/12/2014   CLINICAL DATA:  58 year old male with fever and ventriculostomy shunt. Initial encounter.  EXAM: DG CERVICAL SPINE - 1 VIEW  COMPARISON:  Neck radiographs 05/12/2014.  FINDINGS: Lateral view of the neck. Stable visualized shunt tubing coursing inferiorly obliquely over the cervical spine. No discontinuity identified. Tubing continues into the chest. Superimposed external oxygen tubing. Stable visualized osseous structures.  IMPRESSION: Stable shunt tubing in the neck.   Electronically Signed   By: Lars Pinks M.D.   On: 08/12/2014 12:54   Dg Abd 1 View  08/12/2014   CLINICAL DATA:  58 year old male with fever and ventriculostomy shunt. Initial encounter.  EXAM: ABDOMEN - 1 VIEW  COMPARISON:  05/18/2014.  FINDINGS: Supine view at 1231 hrs. Shunt tubing enters the left upper quadrant and tracks to the right lower quadrant where it is mildly looped. No kinking or discontinuity identified. Non obstructed bowel gas pattern. No acute osseous abnormality identified.  IMPRESSION: Shunt tubing terminating in the right lower quadrant with no adverse features identified.   Electronically Signed   By: Lars Pinks M.D.   On: 08/12/2014 12:55   Ct Head Wo Contrast  08/12/2014   CLINICAL DATA:  Altered mental status/confusion  EXAM: CT HEAD WITHOUT CONTRAST  TECHNIQUE: Contiguous axial images were obtained from the base of the skull through the vertex without intravenous contrast.  COMPARISON:  May 23, 2014  FINDINGS: The patient has a ventricular shunt catheter. The tip of the catheter  is in the medial aspect of the right lentiform nucleus, stable. Note that the ventricles are not dilated and indeed are rather small. The left lateral ventricle is minimally more prominent than the right lateral ventricle, stable. There is no change in ventricular size or configuration since prior study. There is no intracranial mass, hemorrhage, extra-axial fluid collection, or midline shift. Decreased attenuation in  the anterior corona radiata extending to the gray-white compartments of each frontal lobe is a stable finding. Elsewhere there is slight periventricular small vessel disease in the centra semiovale bilaterally. No new gray-white compartment lesions are identified. The bony calvarium appears intact. Visualized mastoid air cells are clear.  IMPRESSION: No appreciable change from prior study. Ventricles are decompressed. Note that the shunt catheter tip is in the medial right lentiform nucleus, stable. The degree of decreased attenuation in both frontal white matter regions is stable as is mild patchy periventricular small vessel disease elsewhere. No acute appearing infarct is seen. No hemorrhage or mass effect.  The possibility of CSF overdrainage must remain of concern given the overall appearance, in particular to the small size of the lateral ventricles.   Electronically Signed   By: Lowella Grip M.D.   On: 08/12/2014 14:22    Janora Norlander, DO 08/14/2014, 4:59 AM PGY-1, [facility] Intern pager: [phone], text pages welcome

## 2014-08-14 NOTE — Progress Notes (Signed)
Patient Discharge: Disposition: Patient discharged home with wife. Education: Patient and wife educated about follow-up appointment, medication, prescription, and discharge instructions and also information on Pneumonia. IV: Discontinued IV before discharge. Telemetry: Discontinued Tele before discharge and CCMD notified. Transportation: Patient transported in w/c accompanied by the staff and wife  Belongings: Patient took all his belongings with her.

## 2014-08-14 NOTE — Discharge Instructions (Signed)
We are so glad to see that you are doing better.  You were admitted for altered mental status due to pneumonia.  You were treated with IV antibiotics and transitioned to oral Levaquin.  You will continue to take this for 6 more days.  You were continued on your home pain medications.  We HIGHLY recommend that you do not take MS Contin more than 2 times daily, as this may increase your risk of confusion.  In addition, please make sure that you follow up with Neurosurgery and your PCP within the next week.   Pneumonia, Adult Pneumonia is an infection of the lungs. It may be caused by a germ (virus or bacteria). Some types of pneumonia can spread easily from person to person. This can happen when you cough or sneeze. HOME CARE  Only take medicine as told by your doctor.  Take your medicine (antibiotics) as told. Finish it even if you start to feel better.  Do not smoke.  You may use a vaporizer or humidifier in your room. This can help loosen thick spit (mucus).  Sleep so you are almost sitting up (semi-upright). This helps reduce coughing.  Rest. A shot (vaccine) can help prevent pneumonia. Shots are often advised for:  People over 95 years old.  Patients on chemotherapy.  People with long-term (chronic) lung problems.  People with immune system problems. GET HELP RIGHT AWAY IF:   You are getting worse.  You cannot control your cough, and you are losing sleep.  You cough up blood.  Your pain gets worse, even with medicine.  You have a fever.  Any of your problems are getting worse, not better.  You have shortness of breath or chest pain. MAKE SURE YOU:   Understand these instructions.  Will watch your condition.  Will get help right away if you are not doing well or get worse. Document Released: 01/25/2008 Document Revised: 10/31/2011 Document Reviewed: 10/29/2010 [facility] Patient Information 2015 [place], [place]. This information is not intended to replace advice  given to you by your health care provider. Make sure you discuss any questions you have with your health care provider.

## 2014-08-18 LAB — CULTURE, BLOOD (ROUTINE X 2)
Culture: NO GROWTH
Culture: NO GROWTH

## 2014-08-27 ENCOUNTER — Encounter: Payer: 59 | Admitting: Family Medicine

## 2014-09-04 ENCOUNTER — Encounter (HOSPITAL_COMMUNITY): Payer: Self-pay | Admitting: Neurosurgery

## 2014-09-07 ENCOUNTER — Other Ambulatory Visit: Payer: Self-pay | Admitting: Family Medicine

## 2014-09-15 ENCOUNTER — Telehealth: Payer: Self-pay | Admitting: Physician Assistant

## 2014-09-15 NOTE — Telephone Encounter (Signed)
I currently have two available appointments on 2/17 (3:00pm and 4:15pm); can the patient be seen on 10/08/14?

## 2014-09-15 NOTE — Telephone Encounter (Signed)
Pt agreed to appt time on 2/17 3pm Pt requested letter with dates and time of appts to be mailed. This has been completed and appointment has been scheduled.

## 2014-09-15 NOTE — Telephone Encounter (Signed)
Spoke to pt- this is the company he is interested in getting his supplies from. He has plenty of O2 for now. He states he knows he missed his appt and wants to come in as soon as possible. Transferred to billing to schedule the appt. Forms in Dr. Tamala Julian box to be completed when pt comes in.   Robert from [employer] called and states he talked to Dr. Tamala Julian on Friday. Robert states Dr. Tamala Julian promised this fax was going to be signed today and faxed back. Per the rep- the flow is 3, nasal cannula.   I have the fax- please advise if this is correct Dr. Tamala Julian and I will have Chelle sign off on the script.

## 2014-09-15 NOTE — Telephone Encounter (Signed)
Aleen Sells received rx for [employer], states that it is fine for a PA to sign, he just needs the PA's name on the rx instead of Dr. Thompson Caul.  I had Herbie Baltimore refax me this request to [phone], and once I receive it I will take it down to Nurse station as it is an emergent request.   Robert's contact:[phone]

## 2014-09-15 NOTE — Telephone Encounter (Signed)
Form from [employer] needs completion and signature. I'm happy to sign it, but need the information to complete it.

## 2014-09-15 NOTE — Telephone Encounter (Signed)
Lm for pt to rtn call to see if he can reschedule the appointment.  This has been signed and faxed back to [place].

## 2014-09-15 NOTE — Telephone Encounter (Signed)
Appointment was made for March 2- Please advise if this needs to be sooner.

## 2014-09-15 NOTE — Telephone Encounter (Signed)
Dr. Tamala Julian,  Please advise if this patient needs to see you sooner than the 3/3 appointment. He was advised to follow-up with you within 1 week after his hospital discharge in late December.  I signed the Rx for the portable O2 concentrator.

## 2014-09-18 NOTE — Telephone Encounter (Signed)
Received another order form. Checked w/Sara as to whether this form has already been completed. She advised order was completed, signed by Dr Tamala Julian and faxed.

## 2014-10-04 ENCOUNTER — Other Ambulatory Visit: Payer: Self-pay | Admitting: Family Medicine

## 2014-10-06 ENCOUNTER — Other Ambulatory Visit: Payer: Self-pay | Admitting: Family Medicine

## 2014-10-08 ENCOUNTER — Ambulatory Visit: Payer: Self-pay | Admitting: Family Medicine

## 2014-10-09 ENCOUNTER — Other Ambulatory Visit: Payer: Self-pay | Admitting: Family Medicine

## 2014-10-20 ENCOUNTER — Other Ambulatory Visit: Payer: 59 | Admitting: Family Medicine

## 2014-10-20 ENCOUNTER — Other Ambulatory Visit: Payer: Self-pay | Admitting: Family Medicine

## 2014-10-20 DIAGNOSIS — Z125 Encounter for screening for malignant neoplasm of prostate: Secondary | ICD-10-CM

## 2014-10-20 DIAGNOSIS — E7800 Pure hypercholesterolemia, unspecified: Secondary | ICD-10-CM

## 2014-10-20 DIAGNOSIS — Z1329 Encounter for screening for other suspected endocrine disorder: Secondary | ICD-10-CM

## 2014-10-20 DIAGNOSIS — R7302 Impaired glucose tolerance (oral): Secondary | ICD-10-CM

## 2014-10-20 DIAGNOSIS — R780 Finding of alcohol in blood: Secondary | ICD-10-CM | POA: Diagnosis not present

## 2014-10-20 LAB — CBC WITH DIFFERENTIAL/PLATELET
Basophils Absolute: 0 10*3/uL (ref 0.0–0.1)
Basophils Relative: 0 % (ref 0–1)
Eosinophils Absolute: 0 10*3/uL (ref 0.0–0.7)
Eosinophils Relative: 0 % (ref 0–5)
HCT: 39.5 % (ref 39.0–52.0)
Hemoglobin: 14.1 g/dL (ref 13.0–17.0)
Lymphocytes Relative: 8 % — ABNORMAL LOW (ref 12–46)
Lymphs Abs: 1.1 10*3/uL (ref 0.7–4.0)
MCH: 30.8 pg (ref 26.0–34.0)
MCHC: 35.7 g/dL (ref 30.0–36.0)
MCV: 86.2 fL (ref 78.0–100.0)
MPV: 9.7 fL (ref 8.6–12.4)
Monocytes Absolute: 0.4 10*3/uL (ref 0.1–1.0)
Monocytes Relative: 3 % (ref 3–12)
Neutro Abs: 12.2 10*3/uL — ABNORMAL HIGH (ref 1.7–7.7)
Neutrophils Relative %: 89 % — ABNORMAL HIGH (ref 43–77)
Platelets: 255 10*3/uL (ref 150–400)
RBC: 4.58 MIL/uL (ref 4.22–5.81)
RDW: 14 % (ref 11.5–15.5)
WBC: 13.7 10*3/uL — ABNORMAL HIGH (ref 4.0–10.5)

## 2014-10-20 LAB — COMPREHENSIVE METABOLIC PANEL
ALT: 18 U/L (ref 0–53)
AST: 15 U/L (ref 0–37)
Albumin: 3.9 g/dL (ref 3.5–5.2)
Alkaline Phosphatase: 102 U/L (ref 39–117)
BUN: 11 mg/dL (ref 6–23)
CO2: 33 mEq/L — ABNORMAL HIGH (ref 19–32)
Calcium: 9.5 mg/dL (ref 8.4–10.5)
Chloride: 96 mEq/L (ref 96–112)
Creat: 0.86 mg/dL (ref 0.50–1.35)
Glucose, Bld: 224 mg/dL — ABNORMAL HIGH (ref 70–99)
Potassium: 5.2 mEq/L (ref 3.5–5.3)
Sodium: 136 mEq/L (ref 135–145)
Total Bilirubin: 0.6 mg/dL (ref 0.2–1.2)
Total Protein: 6.8 g/dL (ref 6.0–8.3)

## 2014-10-20 LAB — LIPID PANEL
Cholesterol: 198 mg/dL (ref 0–200)
HDL: 56 mg/dL (ref >=40)
LDL Cholesterol: 106 mg/dL — ABNORMAL HIGH (ref 0–99)
Total CHOL/HDL Ratio: 3.5 Ratio
Triglycerides: 180 mg/dL — ABNORMAL HIGH (ref <150)
VLDL: 36 mg/dL (ref 0–40)

## 2014-10-20 LAB — TSH: TSH: 1.87 u[IU]/mL (ref 0.350–4.500)

## 2014-10-20 LAB — HEMOGLOBIN A1C
Hgb A1c MFr Bld: 8.5 % — ABNORMAL HIGH (ref <5.7)
Mean Plasma Glucose: 197 mg/dL — ABNORMAL HIGH (ref <117)

## 2014-10-22 ENCOUNTER — Encounter: Payer: Self-pay | Admitting: Family Medicine

## 2014-10-22 ENCOUNTER — Ambulatory Visit (INDEPENDENT_AMBULATORY_CARE_PROVIDER_SITE_OTHER): Payer: 59 | Admitting: Family Medicine

## 2014-10-22 VITALS — BP 118/85 | HR 87 | Temp 97.1°F | Resp 16 | Ht 70.0 in | Wt 182.0 lb

## 2014-10-22 DIAGNOSIS — E890 Postprocedural hypothyroidism: Secondary | ICD-10-CM | POA: Diagnosis not present

## 2014-10-22 DIAGNOSIS — G894 Chronic pain syndrome: Secondary | ICD-10-CM

## 2014-10-22 DIAGNOSIS — Z720 Tobacco use: Secondary | ICD-10-CM

## 2014-10-22 DIAGNOSIS — G919 Hydrocephalus, unspecified: Secondary | ICD-10-CM

## 2014-10-22 DIAGNOSIS — K219 Gastro-esophageal reflux disease without esophagitis: Secondary | ICD-10-CM | POA: Diagnosis not present

## 2014-10-22 DIAGNOSIS — Z1211 Encounter for screening for malignant neoplasm of colon: Secondary | ICD-10-CM | POA: Diagnosis not present

## 2014-10-22 DIAGNOSIS — J8410 Pulmonary fibrosis, unspecified: Secondary | ICD-10-CM | POA: Diagnosis not present

## 2014-10-22 DIAGNOSIS — M810 Age-related osteoporosis without current pathological fracture: Secondary | ICD-10-CM

## 2014-10-22 DIAGNOSIS — E785 Hyperlipidemia, unspecified: Secondary | ICD-10-CM | POA: Diagnosis not present

## 2014-10-22 DIAGNOSIS — R110 Nausea: Secondary | ICD-10-CM

## 2014-10-22 DIAGNOSIS — Z0000 Encounter for general adult medical examination without abnormal findings: Secondary | ICD-10-CM | POA: Diagnosis not present

## 2014-10-22 DIAGNOSIS — J449 Chronic obstructive pulmonary disease, unspecified: Secondary | ICD-10-CM | POA: Diagnosis not present

## 2014-10-22 DIAGNOSIS — E119 Type 2 diabetes mellitus without complications: Secondary | ICD-10-CM

## 2014-10-22 LAB — POCT URINALYSIS DIPSTICK
Bilirubin, UA: NEGATIVE
Glucose, UA: >=1000
Ketones, UA: NEGATIVE
Leukocytes, UA: NEGATIVE
Nitrite, UA: NEGATIVE
Protein, UA: NEGATIVE
Spec Grav, UA: 1.01
Urobilinogen, UA: 0.2
pH, UA: 6

## 2014-10-22 MED ORDER — METFORMIN HCL 500 MG PO TABS: 500.0000 mg | ORAL_TABLET | Freq: Two times a day (BID) | ORAL | Status: DC

## 2014-10-22 MED ORDER — SERTRALINE HCL 100 MG PO TABS: 200.0000 mg | ORAL_TABLET | Freq: Every day | ORAL | Status: DC

## 2014-10-22 MED ORDER — SERTRALINE HCL 100 MG PO TABS: 150.0000 mg | ORAL_TABLET | Freq: Every day | ORAL | Status: DC

## 2014-10-22 NOTE — Progress Notes (Signed)
Subjective:    Patient ID: Kirk Robinson, male    DOB: 10-Sep-1955, 59 y.o.   MRN: [id number]  10/22/2014  Annual Exam   HPI This 59 y.o. male presents for Complete Physical Examination.  Last physical:  2013 Colonoscopy:  09/16/2008;  Iftikhar.  Repeat in 3 years.  Overdue for repeat colonoscopy.  Had polyps.   TDAP:  07/28/2011 Pneumovax:  2015 Influenza:  05/14/2014 Eye exam:  Overdue; +glasses. Dental exam:  Overdue.    Insomnia: valium started by Raul Del for insomnia; now not working well.    Anxiety: much more restless and anxious since Christmas.  Not sleeping well.  Not sure if due to winter months.  Not comfortable driving now; only drives when has to drive.   Excessive worry regarding family.  Nausea is once per week on average.  Requesting medication for nausea.  No vomiting; no abdominal pain; no unintentional weight loss.  Brother died of pancreatic cancer.  Pt takes scheduled narcotics daily for chronic DDD cervical spine.   Review of Systems  Constitutional: Positive for fatigue. Negative for fever, chills, diaphoresis, activity change, appetite change and unexpected weight change.  HENT: Positive for dental problem, hearing loss, mouth sores and tinnitus. Negative for congestion, drooling, ear discharge, ear pain, facial swelling, nosebleeds, postnasal drip, rhinorrhea, sinus pressure, sneezing, sore throat, trouble swallowing and voice change.   Eyes: Positive for photophobia, pain, redness, itching and visual disturbance. Negative for discharge.  Respiratory: Positive for cough, shortness of breath and wheezing. Negative for apnea, choking, chest tightness and stridor.   Cardiovascular: Negative for chest pain, palpitations and leg swelling.  Gastrointestinal: Positive for nausea and constipation. Negative for vomiting, abdominal pain, diarrhea and blood in stool.  Endocrine: Positive for heat intolerance. Negative for cold intolerance, polydipsia, polyphagia and  polyuria.  Genitourinary: Negative for dysuria, urgency, frequency, hematuria, flank pain, decreased urine volume, discharge, penile swelling, scrotal swelling, enuresis, difficulty urinating, genital sores, penile pain and testicular pain.  Musculoskeletal: Positive for back pain, arthralgias and neck pain. Negative for myalgias, joint swelling, gait problem and neck stiffness.  Skin: Negative for color change, pallor, rash and wound.  Allergic/Immunologic: Positive for environmental allergies. Negative for food allergies and immunocompromised state.  Neurological: Positive for weakness. Negative for dizziness, tremors, seizures, syncope, facial asymmetry, speech difficulty, light-headedness, numbness and headaches.  Hematological: Negative for adenopathy. Bruises/bleeds easily.  Psychiatric/Behavioral: Positive for sleep disturbance. Negative for suicidal ideas, hallucinations, behavioral problems, confusion, self-injury, dysphoric mood, decreased concentration and agitation. The patient is not nervous/anxious and is not hyperactive.     Past Medical History  Diagnosis Date  . Altered mental status   . Memory loss   . Osteoporosis, unspecified     bone density per Morivati in 2012  . Iodine hypothyroidism   . Anxiety state, unspecified   . Pure hypercholesterolemia   . Hematuria 03/16/2007  . Allergic rhinitis, cause unspecified   . Alcohol use 03/16/2007    patient risk factors  . Esophageal reflux   . Tobacco use disorder 03/16/2007    patient risk factors  . 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 Cervicalgia 09/17/2007  . Diplopia 12/21/2007  . Other abnormal glucose 04/08/2008  . Personal history of colonic polyps   . Other diseases of lung, not elsewhere classified 01/23/2009    s/p Fleming/pulmonology consult; intolerant  to Advair  . Gastritis 05/13/2009  . Peripheral vascular disease, unspecified 12/24/2009  . Unspecified hypothyroidism  01/07/2010  . Hyperthyroidism  graves disease  . COPD (chronic obstructive pulmonary disease)   . Shortness of breath   . Stroke 2005  . Pneumonia, organism unspecified   . NSIP (nonspecific interstitial pneumonia)     on azathioprine  . HCAP (healthcare-associated pneumonia) 08/12/2014   Past Surgical History  Procedure Laterality Date  . Eye surgery  2010  . 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 Admission 11/2011      altered mental status/confusion with syncope.  CT head negative, MRI brain negative, EEG: diffuse slowing but no  seizure acitivity.  Labs negative.  [facility].  [facility] evaluation  04/2012    poor short-term memory, poor recall, delayed reaction time; permanently disabled.    . Laparoscopic revision ventricular-peritoneal (v-p) shunt Right 05/20/2014    Procedure: LAPAROSCOPIC REVISION VENTRICULAR-PERITONEAL (V-P) SHUNT;  Surgeon: Georganna Skeans, MD;  Location: [facility];  Service: General;  Laterality: Right;   Allergies  Allergen Reactions  . Erythromycin Nausea And Vomiting  . Iodinated Diagnostic Agents Shortness Of Breath  . Advair Diskus [Fluticasone-Salmeterol]     Short of breath  . Darvocet [Propoxyphene N-Acetaminophen]   . Doxycycline Hyclate Swelling    Tongue swelling, upset stomach  . Septra [Sulfamethoxazole-Trimethoprim]    Current Outpatient Prescriptions  Medication Sig Dispense Refill  . atorvastatin (LIPITOR) 20 MG tablet TAKE 1 TABLET (20 MG TOTAL) BY MOUTH DAILY 30 tablet 0  . diazepam (VALIUM) 5 MG tablet Take 5 mg by mouth daily.    Marland Kitchen morphine (MS CONTIN) 100 MG 12 hr tablet Take 100 mg by mouth every 8 (eight) hours.    Marland Kitchen omeprazole (PRILOSEC) 20 MG capsule Take 1 capsule (20 mg total) by mouth daily. 30 capsule 11  . predniSONE (DELTASONE) 10 MG tablet Take 20 mg by mouth daily with breakfast.     . sertraline (ZOLOFT) 100 MG tablet Take 2 tablets (200 mg total) by mouth daily. DISCUSS MED W/ MD AT NEXT VISIT  FOR MORE REFILLS 60 tablet 5  . Tapentadol HCl (NUCYNTA) 75 MG TABS Take 1 tablet by mouth every 4 (four) hours as needed (Take every 4-6 hours as needed for pain).    Marland Kitchen thyroid (ARMOUR) 90 MG tablet Take 90 mg by mouth daily.    Marland Kitchen tiotropium (SPIRIVA) 18 MCG inhalation capsule Place 18 mcg into inhaler and inhale daily.    . Vitamin D, Ergocalciferol, (DRISDOL) 50000 UNITS CAPS Take 50,000 Units by mouth every Sunday.     . zoledronic acid (RECLAST) 5 MG/100ML SOLN injection Inject 5 mg into the vein See admin instructions. Given once per year    . metFORMIN (GLUCOPHAGE) 500 MG tablet Take 1 tablet (500 mg total) by mouth 2 (two) times daily with a meal. 60 tablet 5   No current facility-administered medications for this visit.   History   Social History  . Marital Status: Married    Spouse Name: N/A  . Number of Children: 2  . Years of Education: N/A   Occupational History  .      works full time 40 hrs   Social History Main Topics  . Smoking status: Current Every Day Smoker -- 1.00 packs/day for 30 years  . Smokeless tobacco: Never Used  . Alcohol Use: No  . Drug Use: No  . Sexual Activity: No   Other Topics Concern  . Not on file   Social History Narrative   Marital status:  Married x 35 years.  Children:  Children ages 52, 5.   2 grandchildren (28, 3).      Lives: with wife.     Employment: disability since 2013.       Tobacco:  1/2 ppd x 40 years.      Alcohol: rarely.      Exercise: walking the dogs daily.   Caffeine use: Carbonated beverages, Pepsi: Coffee, many servings a day Pepsi 3-4  Coffee 2 cups. Smoke detectors in home. Always wears seatbelts. Exercise: Inactive. No guns in the home.   Family History  Problem Relation Age of Onset  . Heart disease Father     cad, chf  . Cancer Father   . Heart disease Brother 78    CABG  . Cancer Brother 60    pancreatic cancer  . Diabetes Brother   . Diabetes Brother   . Heart disease Brother 98    CABG  .  Diabetes Brother   . Heart disease Brother 1    CABG  . Cancer Brother 23    prostate cancer  . Heart disease Mother     cad  . Diabetes Mother   . Stroke Mother          Objective:    BP 118/85 mmHg  Pulse 87  Temp(Src) 97.1 F (36.2 C)  Resp 16  Ht 5\' 10"  (1.778 m)  Wt 182 lb (82.555 kg)  BMI 26.11 kg/m2  SpO2 96% Physical Exam  Constitutional: He is oriented to person, place, and time. He appears well-developed and well-nourished. No distress.  HENT:  Head: Normocephalic and atraumatic.  Right Ear: External ear normal.  Left Ear: External ear normal.  Nose: Nose normal.  Mouth/Throat: Oropharynx is clear and moist.  Moon facies.  Eyes: Conjunctivae and EOM are normal.  L pupil non-reactive.  Neck: Normal range of motion. Neck supple. Carotid bruit is not present. No thyromegaly present.  Cardiovascular: Normal rate, regular rhythm, normal heart sounds and intact distal pulses.  Exam reveals no gallop and no friction rub.   No murmur heard. Pulmonary/Chest: Effort normal. He has wheezes. He has no rales.  Abdominal: Soft. Bowel sounds are normal. He exhibits no distension and no mass. There is no tenderness. There is no rebound and no guarding. Hernia confirmed negative in the right inguinal area and confirmed negative in the left inguinal area.  Genitourinary: Rectum normal, prostate normal, testes normal and penis normal. Prostate is not enlarged and not tender. Right testis shows no mass, no swelling and no tenderness. Left testis shows no mass, no swelling and no tenderness. Circumcised.  Musculoskeletal:       Right shoulder: Normal.       Left shoulder: Normal.       Cervical back: Normal.  Lymphadenopathy:    He has no cervical adenopathy.       Right: No inguinal adenopathy present.       Left: No inguinal adenopathy present.  Neurological: He is alert and oriented to person, place, and time. He has normal reflexes. No cranial nerve deficit. He exhibits  normal muscle tone. Coordination normal.  Skin: Skin is warm and dry. No rash noted. He is not diaphoretic.  Psychiatric: He has a normal mood and affect. His behavior is normal. Judgment and thought content normal.   Results for orders placed or performed in visit on 10/22/14  Microalbumin, urine  Result Value Ref Range   Microalb, Ur 0.3 <2.0 mg/dL  POCT urinalysis dipstick  Result Value Ref  Range   Color, UA yellow    Clarity, UA clear    Glucose, UA >=1000    Bilirubin, UA neg    Ketones, UA neg    Spec Grav, UA 1.010    Blood, UA small    pH, UA 6.0    Protein, UA neg    Urobilinogen, UA 0.2    Nitrite, UA neg    Leukocytes, UA Negative        Assessment & Plan:   1. Routine physical examination   2. Type 2 diabetes mellitus without complication   3. Colon cancer screening   4. Nausea without vomiting     1. Complete Physical Examination: anticipatory guidance --- smoking cessation, weight loss, exercise. Immunizations UTD.  Refer for colonoscopy.   2.  DMII: new.  Obtain urine microalbumin. Refer for diabetic education.  Rx for Metformin 500mg  bid.  RTC three months.  Pneumovax and influenza vaccines UTD. 3.  Colon cancer screening: refer to GI for colonoscopy. 4.  Nause: New. Obtain labs; refer for CT abd/pelvis due to family history of pancreatic cancer.  Also refer to GI regarding nausea. 5. Anxiety and depression: worsening anxiety in past three months; increase Zoloft to 200mg  daily; if no improvement, will switch to SNRI or other SSRI.  6. Hyperlipidemia: controlled; continue current medications. 7. COPD and pulmonary fibrosis: worsening; managed by Dr. Raul Del of pulmonology.  Now maintained on continuous oxygen supplementation and 20mg  of Prednisone daily. 8.  Hypothyroidism s/p ablation for Graves disease: stable; managed by [facility] in [place]. 9.  CVA/SAH s/p shunt placement: stable; managed by [facility].  With profound memory deficits and cognitive  limitations. 10. Chronic pain syndrome: stable; managed by pain management.    Meds ordered this encounter  Medications  . metFORMIN (GLUCOPHAGE) 500 MG tablet    Sig: Take 1 tablet (500 mg total) by mouth 2 (two) times daily with a meal.    Dispense:  60 tablet    Refill:  5  . DISCONTD: sertraline (ZOLOFT) 100 MG tablet    Sig: Take 1.5 tablets (150 mg total) by mouth daily. DISCUSS MED W/ MD AT NEXT VISIT FOR MORE REFILLS    Dispense:  60 tablet    Refill:  5  . sertraline (ZOLOFT) 100 MG tablet    Sig: Take 2 tablets (200 mg total) by mouth daily. DISCUSS MED W/ MD AT NEXT VISIT FOR MORE REFILLS    Dispense:  60 tablet    Refill:  5    Return in about 3 months (around 01/22/2015) for recheck diabetes, anxiety.     Braileigh Landenberger Elayne Guerin, M.D. [facility] [address] [place], [place]  [address] [phone] phone [phone] fax

## 2014-10-22 NOTE — Patient Instructions (Signed)

## 2014-10-23 LAB — MICROALBUMIN, URINE: Microalb, Ur: 0.3 mg/dL (ref <2.0)

## 2014-10-24 ENCOUNTER — Telehealth: Payer: Self-pay

## 2014-10-24 DIAGNOSIS — R110 Nausea: Secondary | ICD-10-CM

## 2014-10-24 NOTE — Telephone Encounter (Signed)
 Imaging called stating the pt has to have his scan at the hospital because he told them he was allergic to contrast. It causes him to have shortness of breath and they would feel more comfortable if he was in the hospital to do his scan. I reordered test to be done at [facility].

## 2014-10-27 LAB — PSA: PSA: 0.83 ng/mL (ref <=4.00)

## 2014-10-28 ENCOUNTER — Other Ambulatory Visit: Payer: Self-pay

## 2014-10-28 DIAGNOSIS — R110 Nausea: Secondary | ICD-10-CM

## 2014-10-31 ENCOUNTER — Ambulatory Visit (HOSPITAL_COMMUNITY)
Admission: RE | Admit: 2014-10-31 | Discharge: 2014-10-31 | Disposition: A | Discharge status: Home / Self Care | Payer: 59 | Source: Ambulatory Visit | Attending: Family Medicine | Admitting: Family Medicine

## 2014-10-31 ENCOUNTER — Encounter (HOSPITAL_COMMUNITY): Payer: Self-pay

## 2014-10-31 DIAGNOSIS — R112 Nausea with vomiting, unspecified: Secondary | ICD-10-CM | POA: Diagnosis not present

## 2014-10-31 DIAGNOSIS — R110 Nausea: Secondary | ICD-10-CM

## 2014-11-05 ENCOUNTER — Other Ambulatory Visit: Payer: Self-pay | Admitting: Physician Assistant

## 2014-12-09 NOTE — H&P (Signed)
PATIENT NAME:  Kirk Robinson, CARDIFF MR#:  [id number] DATE OF BIRTH:  21-Nov-1955  DATE OF ADMISSION:  07/25/2012  PRIMARY CARE PHYSICIAN: Dr. Reginia Forts in [place]  PULMONOLOGIST: Dr. Raul Del  CHIEF COMPLAINT: Shortness of breath.   HISTORY OF PRESENT ILLNESS: This is a 59 year old male with past medical history of chronic smoking, Graves' disease and arthritis. He has complaint of shortness of breath and cough for last one week but for last 2 to 3 days he felt worse to the level that he is able to walk only five to ten steps and then he feels that he is out of air so he called Dr. Gust Brooms office and visited him today. Dr. Raul Del checked his oxygen saturation on room air which was 75 and on x-ray he has bilateral extensive pneumonia and so he called in to the hospital and sent him directly for the admission. On further questioning patient says that he had fever, he did not measure how much, but denies any sputum production. He is smoking chronically for more than 40 years but denies any use of inhalers of oxygen at home. He had bad bilateral pneumonia two years ago and he was admitted and Dr. Raul Del took care of him at that time.   REVIEW OF SYSTEMS: CONSTITUTIONAL: Positive for fever. No weight loss or pain or weakness. HEAD/NECK: Denies any trauma, injuries. EYES: Denies any redness, irritation or discharge. EARS: Denies any discharge or hearing problem. RESPIRATORY: No cough, shortness of breath. Denies any sputum. CARDIOVASCULAR: Denies any chest pain, orthopnea, palpitation, or edema on the legs. GASTROINTESTINAL: Denies any nausea, vomiting, diarrhea, or constipation. GENITOURINARY: Denies any increased frequency or burning while urination. ENDOCRINE: He has problem with his thyroid and has his eyes becoming weak because of that problem as per him he says that is Graves' disease. NEUROLOGICAL: Denies any numbness, weakness, tingling, headache, tremors. PSYCHIATRIC: Denies any anxiety, depression,  insomnia.   PAST MEDICAL HISTORY:  1. Graves' disease. 2. Arthritis. 3. Chronic pain syndrome.   PAST SURGICAL HISTORY: Stent in the brain. He says that seven years ago he had a stroke while his eyes lost alignment and doctors put some stent in his brain. He denies it having bleeding stroke. He is not following with any doctor for this and denies any leftover weakness after the stroke.   HOME MEDICATIONS: 1. Ventolin 2 puffs every four hours as needed.  2. Sertraline 100 mg oral once a day. 3. Prilosec 40 mg once a day. 4. Nucynta 75 mg oral tablet every four hours. 5. Morphine 100 mg 12 hour extended release tablet every eight hours.  6. MiraLax one capsule every other day at bedtime. 7. Lorazepam 1 mg oral tablet 2 times a day as needed.  8. Atorvastatin 20 mg oral tablets 0.5 tablet orally once a day. 9. Armour Thyroid 90 mg oral tablet once a day.   ALLERGIES: Dye, doxycycline, erythromycin.   SOCIAL HISTORY: He smokes 1 pack per day almost 40 years. Denies drinking alcohol or doing any drugs. Was working in cigarette factory for [employer] but denies any exposure to fumes, had exposure to some dust.    FAMILY HISTORY: Positive for lung cancer in family and diabetes in multiple family members.   PHYSICAL EXAMINATION:  VITAL SIGNS: Temperature 97.9, pulse rate 84, respirations 17, blood pressure 100/65, pulse oximetry 92% at 3 liters nasal cannula.   GENERAL APPEARANCE: Alert, oriented to time, place, and person, fully cooperative with history taking and physical examination, does appear  in mild respiratory distress due to shortness of breath, has to use oxygen nasal cannula supplementation.   HEENT: Head and neck atraumatic. Conjunctivae pink. Oral mucosa moist.   NECK: Supple. No JVD. No cervical lymphadenopathy.    RESPIRATORY: Bilateral extensive inspiratory and expiratory rhonchi present. Equal air entry bilaterally.   CARDIOVASCULAR: S1, S2 present, tachycardia. No  murmur appreciated.   ABDOMEN: Soft, nontender. Bowel sounds present. No organomegaly appreciated.   SKIN: No rash.   LYMPH: No edema.   NEUROLOGICAL: Follows commands. No tremor. No rigidity. Grossly motor and sensory intact. Extraocular muscles intact. Bilateral exophthalmos present.   PSYCHIATRIC: Does not appear in any acute psychiatric disturbance or having suicidal tendency.   LABORATORY, DIAGNOSTIC AND RADIOLOGICAL DATA: Glucose 111, BUN 10, creatinine 0.81, sodium 137, potassium 3.4, chloride 103, CO2 26, calcium 8.7, total protein 7.4, albumin 3.0, bilirubin 0.9, alkaline phosphate 120, SGOT 36, SGPT 13. Troponin less than 0.02. Thyroid stimulating hormone 0.462. WBC 12.4, hemoglobin 11.6, platelets 261. ABG on room air: pH 7.41, pCO2 41, pO2 45. Chest x-ray is not done yet.   ASSESSMENT AND PLAN:   1. Chronic obstructive pulmonary disease exacerbation secondary to pneumonia. Sent from Dr. Gust Brooms office for admission. Sputum culture. Legionella antigen. Levaquin IV, Tylenol as needed.  2. Chronic obstructive pulmonary disease exacerbation. IV Solu-Medrol, DuoNeb, Spiriva, oxygen via nasal cannula as needed. As he has bilateral extensive pneumonia as per Dr. Gust Brooms description in his note and severe hypoxia we would like to rule out HIV.  3. Current smoker. Smoking cessation counseling done for five minutes. He does not need nicotine patch while in the hospital.  4. Graves' disease. We do not know name of the medication what he is taking at this point of time or dose. Will check his TSH level.  5. Arthritis. He claims that he is taking morphine for his arthritis. Will give him IV morphine as needed.  6. GI and deep vein thrombosis prophylaxis. CODE STATUS: FULL CODE.      TOTAL TIME SPENT: 55 minutes.   ____________________________ Ceasar Lund Anselm Jungling, MD vgv:cms D: 07/25/2012 20:53:25 ET T: 07/26/2012 05:58:41 ET JOB#: [id number]  cc: Ceasar Lund. Anselm Jungling, MD,  <Dictator> Renette Butters. Tamala Julian, MD [name] MD ELECTRONICALLY SIGNED 08/06/2012 22:59

## 2014-12-09 NOTE — Op Note (Signed)
PATIENT NAME:  Kirk Robinson, Kirk Robinson MR#:  [id number] DATE OF BIRTH:  1955-12-03  DATE OF PROCEDURE:  08/10/2012  SURGEON:  Christia Reading E. Genevive Bi, MD.  ASSISTANT:  None.  PREOPERATIVE DIAGNOSIS: Interstitial lung disease.   POSTOPERATIVE DIAGNOSIS: Interstitial lung disease.   PROCEDURE PERFORMED:  1.  Preoperative bronchoscopy to assess endobronchial anatomy.  2.  Right thoracoscopy with biopsy of right middle and right lower lobes.   INDICATIONS FOR PROCEDURE: Mr. Dimaano is a 59 year old white male with a several year history of progressive shortness of breath. He has had two CT scans within the last several weeks showing progressive bilateral interstitial lung disease. Because no specific diagnosis has been made and he has failed to respond to conservative medical management, he was offered a lung biopsy for definitive diagnosis and to determine if treatment were available. Indications and risks of the procedure were explained to the patient who gave his informed consent.   DESCRIPTION OF PROCEDURE: The patient was brought to the operating suite and placed in the supine position. General endotracheal anesthesia was given. A double-lumen tube was inserted and bronchoscopy was carried out. There was no evidence of endobronchial tumor. There were no purulent secretions. The patient was then positioned for right thoracoscopy. All pressure points were carefully padded. The patient was prepped and draped in the usual sterile fashion. Three ports were created. One inferiorly, one anteriorly and a small stab wound just behind the tip of the scapula for a grasping instrument. Through the most inferior port we placed our camera and through the most anterior port was our working port. We could see, upon entering the chest, there was extensive, what appeared to be, subpleural honeycombing throughout. All lobes of the lung appeared equally affected. Appropriate material was sampled from the right lower lobe and the  right middle lobe using multiple firings of an endoscopic stapler. Portions were sent for pathology and frozen section returned abnormal lung tissue that would require further histologic examination. Appropriate cultures were also sent. A single chest tube was inserted through the most inferior port and positioned to the apex of the chest. The wounds were then closed in multiple layers of running absorbable sutures. The chest tube was secured with silk. Sterile dressings were applied. The patient was rolled in the supine position where he was extubated and taken to the recovery room in stable condition.    ____________________________ Lew Dawes Genevive Bi, MD teo:eg D: 08/10/2012 16:32:35 ET T: 08/12/2012 15:01:02 ET JOB#: [id number]  cc:  1.  Ritvik Mczeal E. Genevive Bi, MD, <Dictator> 2.  Herbon E. Raul Del, MD 3.  Shanon Brow __________, MD  Louis Matte MD ELECTRONICALLY SIGNED 08/15/2012 15:36

## 2014-12-09 NOTE — Discharge Summary (Signed)
PATIENT NAME:  Kirk Robinson, Kirk Robinson MR#:  [id number] DATE OF BIRTH:  [birth year]  DATE OF ADMISSION:  07/25/2012 DATE OF DISCHARGE:  08/03/2012  PRIMARY CARE PHYSICIAN: Dr Reginia Forts  CONSULTATIONS: Pulmonary, Dr. Raul Del.   DISCHARGE DIAGNOSES:  1. 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 2. Chronic pain syndrome.  3. Graves' disease.  4. Drug abuse.  CODE STATUS: FULL CODE.   CONDITION: Stable.   HOME MEDICATIONS:  1. Atorvastatin 20 mg p.o. 0.5 tablets at bedtime.  2. Armour Thyroid 90 mg p.o. tablets 1 tablet p.o. daily.  3. Lorazepam 1 mg p.o. 1 tablet b.i.d. p.r.n. for anxiety.  4. Sertraline 100 mg p.o. at bedtime.  5. Prilosec 40 mg p.o. once a day.  6. MiraLAX 1 cap every other day at bedtime.  7. Ventolin 2 puffs every 4 hours p.r.n.  8. Prednisone 40 mg p.o. daily for 2 days, 20 mg p.o. daily for 2 days, 10 mg p.o. daily for 2 days.  9. Stop medication morphine and Nucynta.    Patient needs home health with the nurse, also needs home oxygen 3 liters by nasal cannula.   DIET: Regular.   ACTIVITY: As tolerated.   FOLLOWUP CARE: Follow up with PCP within 1 week and follow up with Dr. Raul Del within 1 to 2 weeks. Patient needs smoking cessation.   REASON FOR ADMISSION: Shortness of breath.   HOSPITAL COURSE: The patient is a 59 year old Caucasian male with a history of chronic smoking, Graves' disease, arthritis who presented to the ED with shortness of breath for 2 to 3 days which is worsening so that he could not walk a few steps.  He visited Dr. Raul Del and found desats at70s, and chest x-ray showed bilateral extensive pneumonia so patient was sent to the to the hospital for direct admission. For detailed history and physical examination, please refer to the admission note dictated by Dr. Anselm Jungling. On admission date, the patient's ABG showed a pH  of 7.41, pCO2 of 41, pO2 of45. WBC 12.4, hemoglobin 11.6.   The patient was admitted for chronic obstructive pulmonary disease exacerbation secondary to pneumonia. The patient was placed on Levaquin, Solu-Medrol, and nebulizer treatment. After admission, the patient's white count increased to 22.3 then down to 16.1, and the last WBC was 11.3. Influenza test is negative. Repeated chest x-ray showed acute hypersensitive pneumonitis. Initially patient was treated with high-flow oxygen then changed to O2 by nasal cannula 2 liters.  Dr. Raul Del suggested continue Levaquin and change Solu-Medrol to prednisone daily and then taper. He suggested to get echocardiogram which showed normal ejection fraction of 65%. Yesterday the patient's O2 saturation decreased to 80s after ambulation with shortness of breath.  Patient's symptoms have much improved today. The patient's O2 saturation is about 94% to 95% on 3 liters oxygen without desats while walking. Patient is clinically stable, will be discharged to home today with home oxygen and home health. Discussed the patient's discharge plan with the patient, patient's wife and case Freight forwarder.   TIME SPENT: About 38 minutes.      ____________________________ Demetrios Loll, MD qc:vtd D: 08/03/2012 15:22:00 ET T: 08/04/2012 09:33:28 ET JOB#: [id number]  cc: Demetrios Loll, MD, <Dictator> Renette Butters. Tamala Julian, MD Demetrios Loll MD ELECTRONICALLY SIGNED 08/04/2012 13:07

## 2014-12-09 NOTE — Consult Note (Signed)
PATIENT NAME:  Kirk Robinson, Kirk Robinson MR#:  [id number] DATE OF BIRTH:  10-31-1955  DATE OF CONSULTATION:  08/09/2012  REFERRING PHYSICIAN:  Dr. Val Eagle.  CONSULTING PHYSICIAN:  Chancie Lampert E. Genevive Bi, MD  REASON FOR CONSULTATION: Open lung biopsy.   I have personally seen and examined the patient. I have independently reviewed his films, including multiple CT scans obtained over the years. I have discussed his care with Dr. Val Eagle.   HISTORY OF PRESENT ILLNESS: The patient is a 59 year old white male with a past medical history significant for smoking. He states that he quit a couple of weeks ago. He has had a progressive history of recurrent pulmonary complaints, including shortness of breath and pneumonia. In the past, he has been treated with antibiotics and steroids and has improved. He has also undergone bronchoscopy several years ago by Dr. Raul Del. He states that after his bronchoscopy he did feel significantly better for many months. His most recent episode started several weeks ago when he began experiencing increasing shortness of breath and was actually admitted to the hospital two weeks ago where he was treated for progressive respiratory failure. He was discharged to home on oxygen therapy but represented to Dr. Gust Brooms office earlier today with again complaints of increasing shortness of breath and inability to do any work without becoming quite dyspneic. The patient was admitted at this time for presumed pneumonia and treatment with antibiotics and steroids. I was asked to evaluate the patient for an open lung biopsy.   The patient states that he has actually had only a rare cough. He has had no fevers. His weight has been up and down, and fluctuated over the last several months. His appetite has been good. He has had no fevers at home.   PAST MEDICAL HISTORY: Is significant for a stroke suffered approximately 10 years ago. That left him with some hydrocephalus and he had a left  ventriculoperitoneal shunt placed. He has also had several orthopedic procedures, including knee arthroscopy and ganglion cyst removal. He has had strabismus surgery on his left eye several times.   SOCIAL HISTORY: He is married. He is currently unemployed. He worked in the Engineer, maintenance (IT). He smoked 1 to 2 packs of cigarettes a day for all of his life and quit just recently. He does not use alcohol.   FAMILY HISTORY: Positive for a family history of lung cancer in his father. He was a heavy smoker as well. There is no family history of other interstitial lung diseases.   REVIEW OF SYSTEMS: Are as per history of present illness and all other review of systems were negative.   PHYSICAL EXAMINATION:  GENERAL: Revealed a pleasant, well-developed, well-nourished gentleman in no distress. He was wearing nasal cannula oxygen. He was able to speak in complete sentences. He was awake, alert, and appropriate.  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 HEENT exam also revealed a palpable shunt on the left side of his neck crossing the anterior chest wall down to the abdomen. There were no carotid bruits. There were no palpable masses in his neck or supraclavicular space.  LUNGS: Showed end-expiratory wheezes with dry crackles throughout.  HEART: Regular. There were no murmurs.  ABDOMEN: Soft, nontender, nondistended. There were no palpable masses. There was no hepatosplenomegaly. He did have a shunt incision in the left upper quadrant.  EXTREMITIES: Were without clubbing, cyanosis, or edema. He had good pulses.  neurologic:  Exam was essentially unremarkable.   ASSESSMENT AND PLAN: I have independently reviewed the patient's CT scan. Over the last several years, he has had a few CT scans _____ which show progressive pulmonary infiltrate. This is most consistent with an interstitial lung disease. I reviewed with the patient in  detail the indications and risks of thoracoscopic lung biopsy. He understands that we may need to perform a thoracotomy for definitive biopsy. Risks of bleeding, infection, air leak, mechanical ventilation and death were all reviewed. I believe that he has had a sufficient course of medical therapy to warrant this and he understands and would like [place] to proceed. We will go ahead and schedule this for tomorrow.   Thank you very much for allowing me to participate in his care.   ____________________________ Lew Dawes. Genevive Bi, MD teo:th D: 08/09/2012 17:24:56 ET T: 08/09/2012 21:27:41 ET JOB#: [id number]  cc: Lew Dawes. Genevive Bi, MD, <Dictator> Herbon E. Raul Del, MD Dr. Val Eagle Louis Matte MD ELECTRONICALLY SIGNED 08/12/2012 5:03

## 2014-12-12 NOTE — Discharge Summary (Signed)
PATIENT NAME:  Kirk Robinson, Kirk Robinson MR#:  [id number] DATE OF BIRTH:  [birth year]  DATE OF ADMISSION:  07/17/2013 DATE OF DISCHARGE:  07/30/2013  For a detailed note, please take a look at the history and physical done on admission by Dr. Fritzi Mandes. Please also look at the interim discharge summary which covers an extensive part of the hospital course from admission, 11/26, until 07/27/2013. This is the interim course from 12/07 until 12/09.  DISCHARGE DIAGNOSES:  1.  Acute on chronic respiratory failure secondary to pulmonary fibrosis. 2.  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  3.  Depression. 4.  Chronic back pain. 5.  Hyperlipidemia. 6.  Oral thrush. 7.  Bilateral inguinal fungal skin infections.   DISPOSITION: The patient is being discharged to home with home health physical therapy services.   DISCHARGE ACTIVITY: As tolerated.   DISCHARGE FOLLOWUP: Follow up with Dr. Reginia Forts in the next 1 to 2 weeks and also Dr. Raul Del in the next 1 to 2 weeks.  DISCHARGE MEDICATIONS: 1.  Armour Thyroid 90 mg daily. 2.  MS Contin 100 mg q. 8 hours. 3.  Zoloft 100 mg daily. 4.  Atorvastatin 20 mg daily/ 5.  Spiriva 1 puff daily. 6.  Nucynta 75 mg q. 4 to 6 hours as needed. 7.  Prilosec 20 mg daily. 8.  Prednisone 10 mg daily to be started after he finishes the taper, from 35 mg down to 10 mg over the next 6 days. 8.  Azathioprine 50 mg daily. 9.  Valium 5 mg q. 8 hours as needed.   [facility] COURSE: Dr. Izora Gala Phifer from palliative care and Dr. Raul Del from pulmonary critical care.   BRIEF INTERIM HOSPITAL COURSE: This is a 59 year old male with medical problems as mentioned above who presented to the hospital with shortness of breath likely secondary to his COPD with underlying pulmonary fibrosis.  1.  Acute on chronic respiratory failure. This was likely secondary to the patient's underlying COPD with underlying pulmonary fibrosis and  also multifocal pneumonia which was seen on his chest x-ray on admission. The patient had been adequately treated with broad-spectrum antibiotics, with cefepime and Levaquin for 10 days. His cultures have remained negative. The patient presently is on 3 to 4 liters nasal cannula, which is what he is currently being discharged on. He will finish his oral prednisone taper for his COPD as stated. 2.  Chronic severe pulmonary fibrosis. The patient follows with Dr. Raul Del as an outpatient. He is already on azathioprine and nebs, which he will continue. He was on IV steroids, is currently being tapered over to oral prednisone. He is finishing up a prednisone taper. He has 6 more days of taper left starting at 35 mg down to 10 mg over the next 6 to 7 days.  3.  Chronic back pain. The patient was initially in the hospital on a PCA pump, managed by palliative care. He has now been weaned off the morphine PCA pump, currently being maintained on just oral MS Contin.  4.  Hypothyroidism. The patient was maintained on his Armour Thyroid. He will continue that.   5.  Depression. The patient was maintained on Zoloft. He will discontinue that.  6.  Oral thrush. This has significantly improved with oral nystatin. He still has a mild fungal skin infection, which he will continue to apply nystatin cream to.   The patient is being discharged home with home health services. There was at some point  thought that he would go to a long-term acute care facility, although his insurance would not approve that and he preferred not to go to a skilled nursing facility but rather go home with home health services, which is currently being arranged for him. The patient is a DNI/DNR. He is being discharged home. The patient also has been given a prescription for Valium for his anxiety and air hunger, which seems to help him with his end-stage COPD.  TIME SPENT ON DISCHARGE: 40 minutes. ____________________________ Belia Heman. Verdell Carmine,  MD vjs:sb D: 07/30/2013 15:22:14 ET T: 07/30/2013 16:55:46 ET JOB#: [id number]  cc: Belia Heman. Verdell Carmine, MD, <Dictator> Renette Butters. Tamala Julian, MD Herbon E. Raul Del, MD Henreitta Leber MD ELECTRONICALLY SIGNED 08/01/2013 14:33

## 2014-12-12 NOTE — Op Note (Signed)
PATIENT NAME:  Kirk Robinson, Kirk Robinson MR#:  [id number] DATE OF BIRTH:  Jan 16, 1956  DATE OF PROCEDURE:  07/23/2013  PREOPERATIVE DIAGNOSES: Pneumonia.   POSTOPERATIVE DIAGNOSIS: Pneumonia.   PROCEDURES:  1. Ultrasound guidance for vascular access to the right brachial vein.  2. Fluoroscopic guidance for placement of catheter.  3. Insertion of peripherally inserted central venous catheter, double-lumen PICC line, right arm.   SURGEON: Katha Cabal M.D.   ANESTHESIA: Local.   ESTIMATED BLOOD LOSS: Minimal.   INDICATION FOR PROCEDURE: Requiring IV antibiotics greater than five days.   DESCRIPTION OF PROCEDURE: The patient's right arm was sterilely prepped and draped, and a sterile surgical field was created. The brachial vein was accessed under direct ultrasound guidance without difficulty with a micropuncture needle and permanent image was recorded. 0.018 wire was then placed into the superior vena cava. Peel-away sheath was placed over the wire. A single lumen peripherally inserted central venous catheter was then placed over the wire and the wire and peel-away sheath were removed. The catheter tip was placed into the superior vena cava and was secured at the skin at 37 cm with a sterile dressing. The catheter withdrew blood well and flushed easily with heparinized saline. The patient tolerated procedure well.  Review of the chest x-ray demonstrates the catheter tip is retrograde in the jugular. Ultimately, after several different maneuvers, obtaining an antegrade tip placement within the superior vena cava was not feasible, and the catheter was repositioned. It is transected to 5 cm and secured with a sterile dressing. The catheter withdrew blood well and flushed easily and it was packed with normal saline. The patient tolerated the procedure well.   ADDENDUM: Given the repositioning, this PICC line is 25 cm in length.    ____________________________ Katha Cabal,  MD ggs:np D: 07/23/2013 19:29:10 ET T: 07/23/2013 21:27:26 ET JOB#: [id number]  cc: Katha Cabal, MD, <Dictator> Katha Cabal MD ELECTRONICALLY SIGNED 07/29/2013 17:25

## 2014-12-12 NOTE — Consult Note (Signed)
   Comments   Follow up visit made. Pain not well controlled. Meds reviewed. Pt's MS Contin ordered incorrectly and he has not been getting it. Has been getting only PCA. He is chronicallly on MS Contin 100mg  q 8 hrs. Will restart. Will d/c continuous morphine drip but continue PCA morphine for breakthrough. Discussed with wife who is in agreement.  has gotten relief with dizepam. Will add prn order.  given with good results. Pt is on bowel regimen.   Electronic Signatures: Jaleigh Mccroskey, Izora Gala (MD)  (Signed 05-Dec-14 16:21)  Authored: Palliative Care   Last Updated: 05-Dec-14 16:21 by Lenvil Swaim, Izora Gala (MD)

## 2014-12-12 NOTE — H&P (Signed)
PATIENT NAME:  Kirk Robinson, Kirk Robinson MR#:  [id number] DATE OF BIRTH:  [birth year]  DATE OF ADMISSION:  07/17/2013  PRIMARY CARE PHYSICIAN:  Dr. Reginia Forts  PRIMARY PULMONOLOGIST:  Dr. Raul Del   CHIEF COMPLAINT: Shortness of breath and fever.    HISTORY OF PRESENT ILLNESS: Kirk Robinson is a pleasant 59 year old Caucasian gentleman with history of pulmonary fibrosis, history of Graves' disease, who is on chronic home oxygen, comes to the Emergency Room after he started having increasing shortness of breath with cough, which was dry, and fever of 101 at home. The patient normally uses 2 liters of oxygen. He was found to be hypoxic with sats in the 80s on 2 liters. Chest x-ray in the Emergency Room shows multilobar pneumonia. The patient is being admitted for sepsis secondary to multilobar pneumonia. He received IV Zosyn, Levaquin and vancomycin in the Emergency Room. Blood cultures were drawn in the Emergency Room. The patient was recently seen by Dr. Raul Del the past Monday, was started on Levaquin as outpatient. He is on chronic prednisone for his pulmonary fibrosis, along with azathioprine.   ALLERGIES: DYE, DOXYCYCLINE AND ERYTHROMYCIN.   PAST MEDICAL HISTORY: 1.  Pulmonary fibrosis.  2.  COPD, on home oxygen with chronic respiratory failure.  3.  Chronic pain syndrome.  4.  Hyperlipidemia.  5.  Hypothyroidism.  6.  History of Graves' disease   HOME MEDICATIONS:  1.  Armour Thyroid 90 mg p.o. daily.   2.  Atorvastatin 20 mg daily.  3.  Azathioprine 50 mg daily.  4.  Morphine 100 mg orally extended tablet 1 p.o. q.8 hourly as needed.  5.  Nucynta 75 mg every 4 to 6 hours as needed.  6.  Prednisone 10 mg daily.  7.  Prilosec 20 mg delayed release p.o. daily.  8.  ProAir 90 mcg/inhalation 2 puffs 4 times a day as needed.  9.  Zoloft 100 mg daily.  10.  Spiriva 18 mcg inhalation daily.   SOCIAL HISTORY: The patient continues to smoke every now and then at home. His wife reports he quit about  a week ago. He has a long-standing history of smoking in the past. Denies any alcohol use. 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    REVIEW OF SYSTEMS:   CONSTITUTIONAL: Positive for fever, fatigue and weakness.  EYES: No blurred or double vision, glaucoma or cataracts.  EARS, NOSE, THROAT: No tinnitus, ear pain, hearing loss or postnasal drip.  RESPIRATORY: Positive for cough, shortness of breath, COPD.  No hemoptysis.  CARDIOVASCULAR: No chest pain, orthopnea, edema. Positive for dyspnea on exertion.  GASTROINTESTINAL: No nausea, vomiting, diarrhea, abdominal pain or hematemesis.  GENITOURINARY: No dysuria, hematuria or frequency.  ENDOCRINE: No polyuria, nocturia or thyroid problems.  HEMATOLOGY: No anemia or easy bruising or bleeding.  SKIN: No acne, rash or lesions.  MUSCULOSKELETAL: Positive for arthritis, swelling.  No gout.  NEUROLOGIC: No CVA, TIA, vertigo or dementia.  PSYCHIATRIC: No anxiety, depression. No bipolar disorder. All other systems reviewed and negative.   PHYSICAL EXAMINATION: GENERAL: The patient is awake, although he appears very fatigued and sleepy.  VITAL SIGNS:  Afebrile. Pulse is 100. Blood pressure is 120/83. Pulse ox is 88% on 3 liters.  HEENT: Atraumatic, normocephalic. Pupils: PERRLA. EOM intact. Oral mucosa is dry.  NECK: Supple. No JVD. No carotid bruit.  RESPIRATORY: The patient has bilateral decreased breath sounds, occasional scattered wheezing present. No use of accessory muscles. Mild to moderate respiratory distress.  CARDIOVASCULAR: Tachycardia present. No murmur heard. PMI not lateralized. Chest nontender.  EXTREMITIES: Good pedal pulses, good femoral pulses. No lower extremity edema.  ABDOMEN: Soft, benign, nontender. No organomegaly. Positive bowel sounds.  NEUROLOGIC: Grossly intact cranial nerves II through XII. No motor or sensory deficit.  SKIN: Warm and dry.   EKG showed sinus tach with LAD.  Q waves in anterior  and inferior leads appears older.   Chest x-ray consistent with multilobar pneumonia, bilateral.   White count is 12.4. H and H is 12.5 and 37.0. Platelet count is 256. Glucose is 157. BUN 11, creatinine 0.9. Sodium is 135. Potassium is 3.4. LFTs within normal limits. Albumin is 3.1 troponin is 0.02.   ASSESSMENT AND PLAN: A 59 year old Kirk Robinson with history of pulmonary fibrosis, chronic respiratory failure on home oxygen, along with history of hyperlipidemia, comes into the Emergency Room with:  1.  Sepsis, suspected due to multilobar pneumonia. We will admit the patient in intensive care unit.   Continue oxygen 4 liters per minute if needed. Use either high flow on nonrebreather. We will continue broad-spectrum antibiotics with vancomycin, cefepime and Levaquin to cover pseudomonas and MRSA risk, since the patient has COPD and chronic lung disease with pulmonary fibrosis.  Pulmonary consultation with the patient's pulmonologist, which is Dr. Raul Del. Follow up blood cultures and sputum cultures, if the patient is able to give a sputum sample.   2.  Acute on chronic hypoxic respiratory failure due to chronic obstructive pulmonary disease exacerbation in the setting of multilobar pneumonia. We will give steroids. The patient is on chronic p.o. steroids at home. We will change it to IV Solu-Medrol 40 q.8 hourly, continue nebulizer treatments and oral inhalers. Dr. Raul Del to see the patient.  3.  Leukocytosis due to multilobar pneumonia.  4.  Hyperlipidemia.  Continue atorvastatin.  5.  Deep vein thrombosis prophylaxis with subQ heparin t.i.d.  6.  Chronic pain syndrome. We will continue the patient's morphine and Nucynta as before.  7.  Gastroesophageal reflux disease thrombosis prophylaxis with Prilosec.   Further workup according to the patient's clinical course. The patient is a FULL CODE. Code status was addressed with the patient's wife, who was present in the Emergency Room.   CRITICAL  TIME SPENT: 60 minutes.    ____________________________ Hart Rochester Posey Pronto, MD sap:dmm D: 07/17/2013 16:31:22 ET T: 07/17/2013 20:11:19 ET JOB#: [id number]  cc: Reinhardt Licausi A. Posey Pronto, MD, <Dictator> Renette Butters. Tamala Julian, MD Herbon E. Raul Del, MD Ilda Basset MD ELECTRONICALLY SIGNED 07/19/2013 10:45

## 2014-12-12 NOTE — Discharge Summary (Signed)
DATE OF BIRTH:  1956-06-04  PRIMARY CARE PHYSICIAN:  Dr. Reginia Forts  PRIMARY PULMONOLOGIST:  Dr. Wallene Huh    DISCHARGE DIAGNOSES:   1.  Chronic obstructive pulmonary disease exacerbation.  2.  Pulmonary fibrosis.  3.  Chronic pain syndrome.   HISTORY OF PRESENTING ILLNESS:  A 59 year old male with past medical history of chronic smoking, Graves' disease and arthritis. Complaint of shortness of breath and cough for the last 2 to 3 days. He was admitted to the hospital 2 weeks ago and was discharged 1 week ago of presentation in ER again. He was prescribed a tapering dose of oral steroids and antibiotic to finish the course at home. He just finished the oral steroid the previous day before coming to the Emergency Room. He had a visit with Dr. Raul Del in the office as a followup after the admission, but he started feeling short of breath for the last 2 to 3 days, more and more worsening, and when he went to Dr. Gust Brooms office, his oxygen saturation with 3 L nasal cannula substitution was still like around 89 to 2, and he was having severe wheezing, which did not get relieved even after nebulized with Duoneb, and so Dr. Raul Del sent him for admission to the [facility]:  As he was admitted as advised by Dr. Raul Del, Dr. Mortimer Fries saw him on the first day of admission, and he called the thoracic surgeon because the plan was to get the lung biopsy for him as he had findings of severe pulmonary fibrosis. So the next day, his lung biopsy was done, and he was admitted in ICU after that. He was managed for his COPD exacerbation with IV steroids and antibiotics, and after the biopsy, he had the chest tube placed for drainage of secretions. He remained quite comfortable on nasal cannula oxygen and slowly improved after 3 to 4 days from biopsy. His chest tube came out, and his oxygen requirement went down to the level that he was able to tolerate room air, and he was able to walk  around without any exertion. So finally, he was discharged with continued oral tapering steroids and medications as advised by pulmonologist with a followup appointment with Dr. Raul Del for the reports of his lung biopsy.   OTHER MEDICAL ISSUES ADDRESSED DURING THE HOSPITAL STAY:  Hypothyroidism, continue Armour Thyroid. Chronic pain, Percocet, IV morphine, and pain has remained under control. Hyperlipidemia, continue statin. Anxiety, continue Lorazepam and sertraline. Hyperglycemia due to high steroid, but on the discharge, the sugar level was coming down with tapering an oral dose of steroids, so advised nothing to take at home for his hyperglycemia.   IMPORTANT LABORATORY RESULTS DURING THE HOSPITAL STAY:  WBC count went up to 24.4, hemoglobin 12.7 and platelet count 300, MCV 88. CT chest on admission reported increased conspicuity distribution of the patient's interstitial pneumonitis, component of interstitial finding represents underlying interstitial pulmonary fibrosis. Evaluation is recommended status post appropriate therapeutic regimen. ABG on admission pH 7.44, PCO2 of 44 and PO2 of 102 on 32% FiO2 supplementation. Troponin was less than 0.02. Influenza A and B antigens were negative. Legionella antigen in the urine was negative. AFB culture for smear was negative. Viral culture general results were pending, need to be followed by Dr. Raul Del in the office.   CONSULTS DURING THE HOSPITALIZATION:  Pulmonary consult Dr. Mortimer Fries and Dr. Raul Del. Thoracic surgery consult with Dr. Genevive Bi.   CONDITION ON DISCHARGE:  Stable.   CODE STATUS  ON DISCHARGE:  FULL CODE.   MEDICATIONS ON DISCHARGE:  Armour Thyroid 90 mg oral once a day, Lorazepam 1 mg oral 2 times a day as needed for anxiety, Prilosec 40 mg oral delayed-release capsule once a day, MiraLax 1 capsule every other day at bedtime, morphine 100 mg 12-hour oral capsule extended release every 8 hours, sertraline 100 mg oral tablet once a day,  atorvastatin 20 mg oral tablet once a day, prednisone 10 mg oral tablet start at 60 mg and taper by 10 mg daily until complete, tiotropium 18 mcg inhalation capsule 1 capsule inhaled once a day for 30 days, budesonide 180 mcg inhalation powder 1 puff 2 times a day.   HOME HEALTH:  No.  HOME OXYGEN ON DISCHARGE:  No.   DIET ON DISCHARGE:  Carbohydrate controlled ADA diet, regular consistency.   ACTIVITY LIMITATION:  As tolerated.    PLAN:  For him to follow up within 1 to 2 weeks with Dr. Wallene Huh, [facility], department of pulmonology and with primary care physician, Dr. Reginia Forts, [facility], [address] [place], [place].   TOTAL TIME SPENT IN DISCHARGE:  45 minutes.   ____________________________ Kirk Lund Anselm Jungling, MD vgv:ms D: 08/18/2012 14:49:38 ET T: 08/19/2012 18:42:42 ET JOB#: [id number]  cc: Kirk Lund. Anselm Jungling, MD, <Dictator> Herbon E. Raul Del, MD Renette Butters. Tamala Julian, MD [name] MD ELECTRONICALLY SIGNED 09/17/2012 8:18

## 2014-12-12 NOTE — H&P (Signed)
PATIENT NAME:  Kirk Robinson, BUDAI MR#:  [id number] DATE OF BIRTH:  [birth year]  DATE OF ADMISSION:  08/09/2012  PRIMARY PULMONOLOGIST: Dr. Raul Del  PRIMARY CARE PHYSICIAN:  Dr. Reginia Forts   CHIEF COMPLAINT: Shortness of breath.   HISTORY OF PRESENT ILLNESS: The patient is a 59 year old male with past medical history of chronic smoking, Graves disease, arthritis, and complaint of shortness of breath and cough for 2 to 3 days. He was admitted to the hospital for the last 2 weeks and just discharged 1 week ago. He said that he was feeling better for initially 2 to 3 days after the discharge but then started deteriorating again for the last 2 to 3 days; and yesterday he was very short of breath, and even sitting with nasal cannula oxygen supplementation he was getting more and more short of breath. He had an appointment today with Dr. Raul Del as a followup from hospital discharge, so he went over there. He received 3 nebulized treatments of Duo-Neb, and he was on 3 liters nasal cannula oxygen and still was saturating in 89 to 90, and so Dr. Raul Del decided to send him back for admission.   REVIEW OF SYSTEMS:  CONSTITUTIONAL: Negative for fever, weight loss, pain, or weakness.  HEAD/NECK: Denies any trauma or injury.   EYES: Denies any redness, irritation or discharge.  EARS: Denies any discharge, hearing problem.  RESPIRATORY: No cough. Has shortness of breath. Denies any sputum production.  CARDIOVASCULAR: Denies any chest pain, orthopnea, palpitations, or edema on the legs.  GASTROINTESTINAL: Denies any nausea, vomiting, diarrhea, or constipation.  GENITOURINARY: Denies any increased frequency, burning while urinating.  ENDOCRINOLOGAL: Has problem with his thyroid. His eyes are becoming weak because of problem. Has Graves disease.  NEUROLOGICAL: Denies any numbness, weakness, tingling or  headache.  PSYCHIATRIC: Denies any anxiety, depression, or insomnia.   PAST MEDICAL HISTORY: Graves disease,  arthritis, chronic pain syndrome.   PAST SURGICAL HISTORY: Stent in the brain. 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 He is not following with any doctor for this and denies any leftover weakness after the stroke.   HOME MEDICATIONS: He was advised on discharge 5 days ago: Atorvastatin 20 mg tablet once a day, Armour thyroid 90 mg oral tablet once a day, lorazepam 1 mg oral tablet 2 times a day as needed for anxiety, Sertraline 100 mg oral tablet once a day, Prilosec 40 mg oral delayed-release once a day, MiraLax 1 capsule orally every other day at bedtime, Ventolin 2 puffs every 4 hours as needed for shortness of breath, prednisone tapering, 10 mg tapering every day. He just finished his last dose yesterday of prednisone.   HOME OXYGEN: 2 to 3 liters nasal cannula supplementation.   ALLERGIES:  DYE, DOXYCYCLINE, AND ERYTHROMYCIN.   SOCIAL HISTORY:  He smokes 1 pack per day for almost 40 years. He denies drinking alcohol or doing any drugs. He was working in a cigarette factory for [employer] but denies any exposure to fumes. He had exposure to some dust.   FAMILY HISTORY: Positive for lung cancer in family and diabetes in multiple family members.   PHYSICAL EXAMINATION: VITAL SIGNS: Temperature 98.2, pulse rate 68, respirations 22, blood pressure 117/71, and oxygen saturation 92% at rest with 3 liters nasal cannula oxygen supplementation.  GENERAL APPEARANCE: He is fully alert, oriented to time, place, and person and cooperative with history and physical examination.  He is in mild distress due to respiratory problem.  HEENT: Head and neck atraumatic. Conjunctivae pink. Oral mucosa moist.  NECK: Supple. No JVD.  RESPIRATORY: Bilateral wheezing present. Equal air entry. Use of accessory muscles of respiration.  CARDIOVASCULAR: S1, S2 present, regular. No murmur.  ABDOMEN: Soft, nontender, bowel sounds present, no  organomegaly appreciated.  NEUROLOGICAL: Grossly intact. No tremor. Power 5 out of 5 in all 4 limbs.  PSYCHIATRIC: There does not appear any acute psychiatric illness.     LABORATORY, DIAGNOSTIC AND RADIOLOGICAL DATA:  Results were not present at the time of admission but ordered and checked later on as follows: Glucose 242, BUN 7 creatinine 0.69, sodium 137, potassium 3.7, chloride 102, CO2 of 30, calcium 8.1, total protein 5.9, albumin 2.3, bilirubin 0.3, alkaline phosphate 108, SGOT 17, SGPT 20.0. WBC 11.2, hemoglobin 11.9, platelet count 269.  Arterial blood gas:  pH 7.44, pCO2 44, and pO2 of 102 on 32% FiO2 nasal cannula oxygen supplementation.  CT of the chest is done: Interstitial pneumonitis increased conspicuity distribution, a component of interstitial finding like represents underlying interstitial pulmonary fibrosis.  Surveillance evaluation is recommended status post appropriate therapeutic regimen.   ASSESSMENT AND PLAN:  1. Chronic obstructive pulmonary disease exacerbation, recent discharge from hospital: He finished oral steroid yesterday. In the past he was not using any oxygen, but after discharge last week he was advised to use home oxygen and started feeling extremely short of breath for the last two days. No fever or cough. We will give IV steroid, DuoNebs nebulizer, Spiriva. He was on Levaquin last week, so we will start on Rocephin this time. Sputum culture, urine antigen for Legionella, and streptococci, influenza A and B.  He already got CT scan of the chest. Pulmonary consult with Dr. Mortimer Fries.  2. Hypothyroidism: Armour thyroid continued.  3. Chronic pain: Percocet. He claims that he was taking morphine 100 mg extended release tablet every 8 hours. This is too high dose, and I asked the wife to bring the prescription bottle. Once she brings it, we will  prescribe that in the hospital.  4. Hyperlipidemia: Statin.  5. Anxiety: Lorazepam and sertraline.   6. Gastrointestinal and  deep vein thrombosis prophylaxis.   CODE STATUS:  FULL CODE.    TOTAL TIME SPENT: 55 minutes.   ____________________________ Ceasar Lund Anselm Jungling, MD vgv:cb D: 08/09/2012 17:50:00 ET T: 08/09/2012 18:20:59 ET JOB#: [id number]  cc: Renette Butters. Tamala Julian, MD [name] MD ELECTRONICALLY SIGNED 09/17/2012 8:15

## 2014-12-13 NOTE — Op Note (Signed)
PATIENT NAME:  Kirk Robinson, Kirk Robinson MR#:  [id number] DATE OF BIRTH:  [birth year]  DATE OF PROCEDURE:   07/23/2013  PREOPERATIVE DIAGNOSES: 1.  Chronic obstructive pulmonary disease exacerbation.  2.  Pulmonary fibrosis.  3.  Pneumonia.   POSTOPERATIVE DIAGNOSES:   1.  Chronic obstructive pulmonary disease exacerbation.  2.  Pulmonary fibrosis.  3.  Pneumonia.   PROCEDURES:  1. Ultrasound guidance for vascular access to the right brachial vein.  2. Fluoroscopic guidance for placement of catheter.  3. Insertion of double lumen PICC line, right arm.  SURGEON: Hortencia Pilar, M.D.  ANESTHESIA: Local.   ESTIMATED BLOOD LOSS: Minimal.   INDICATION FOR PROCEDURE:  1.  Requiring multiple parenteral medications, greater than 5 days. 2.  Lack of appropriate IV access.  DESCRIPTION OF PROCEDURE: The patient's right arm was sterilely prepped and draped, and a sterile surgical field was created. The brachial vein was accessed under direct ultrasound guidance without difficulty with a micropuncture needle and permanent image was recorded. 0.018 wire was then placed into the superior vena cava. Peel-away sheath was placed over the wire. A single lumen peripherally inserted central venous catheter was then placed over the wire and the wire and peel-away sheath were removed. The catheter tip was placed into the superior vena cava and was secured at the skin at 37 cm with a sterile dressing. The catheter withdrew blood well and flushed easily with heparinized saline. The patient tolerated procedure well.   ____________________________ Katha Cabal, MD ggs:dmm D: 08/02/2013 18:04:00 ET T: 08/02/2013 19:11:14 ET JOB#: [id number]  cc: Katha Cabal, MD, <Dictator> Katha Cabal MD ELECTRONICALLY SIGNED 08/27/2013 19:27

## 2014-12-13 NOTE — Discharge Summary (Signed)
Dates of Admission and Diagnosis:  Date of Admission 06-May-2014   Date of Discharge 11-May-2014   Admitting Diagnosis Ac on ch respi failure- COPD exacerbation   Final Diagnosis Altered mental status- metabolic encephalopathy Ac on ch respi failure- COPD exacerbation Hx of CVA- and ventricular shunt placement- with some dilatation noted on CT head 05/11/14 Hyperlipidemia Hypothyroidism    Chief Complaint/History of Present Illness A 59 year old Caucasian gentleman with a history of COPD, nonspecific interstitial pneumonia, and chronic respiratory failure on 3 L nasal cannula at baseline presenting with shortness of breath. Describes a 1-week duration of symptoms, essentially URI-like symptoms with nasal congestion, sore throat, as well as positive sick contacts in multiple family members. Saw pulmonary for the above symptoms, with concern for the possibility of worsening pulmonary issues. They increased his steroids, started him on amoxicillin. Despite those measures, 2 days ago he had worsening shortness of breath with associated confusion. Confirmed by family at bedside. Cough nonproductive, as well as fever measured at 101 degrees Fahrenheit at home. He was also noted to be hypoxemic despite his supplemental O2 at 3 L nasal cannula and thus brought to the hospital for further workup and evaluation. The patient himself is unable to provide meaningful information, given the confusion at this time. Most of the history obtained by wife at bedside.   Allergies:  Doxycycline: Unknown  Erythromycin: Unknown  Contrast dye: Unknown    Hepatic:  14-Sep-15 22:57   Bilirubin, Total 0.3  Alkaline Phosphatase 87 (46-116 NOTE: New Reference Range 03/11/14)  SGPT (ALT) 14 (14-63 NOTE: New Reference Range 03/11/14)  SGOT (AST) 24  Total Protein, Serum 7.3  Albumin, Serum  2.7  TDMs:  16-Sep-15 07:17   Vancomycin, Trough LAB 15 (Result(s) reported on 07 May 2014 at 07:59AM.)  Routine Micro:   14-Sep-15 22:57   Micro Text Report BLOOD CULTURE   COMMENT                   NO GROWTH AEROBICALLY/ANAEROBICALLY IN 5 DAYS   ANTIBIOTIC                       Culture Comment NO GROWTH AEROBICALLY/ANAEROBICALLY IN 5 DAYS  Result(s) reported on 10 May 2014 at 11:00PM.  Lab:  18-Sep-15 16:20   pH (ABG) 7.41 (7.350-7.450 NOTE: New Reference Range 03/15/14)  PCO2 46 (32-48 NOTE: New Reference Range 04/01/14)  PO2  60 (83-108 NOTE: New Reference Range 03/15/14)  FiO2 44  Base Excess  3.8 (-3-3 NOTE: New Reference Range 04/01/14)  HCO3  29.2 (21.0-28.0 NOTE: New Reference Range 03/15/14)  O2 Saturation 92.2  O2 Device CANNULA  Specimen Site (ABG) RT BRACHIAL  Specimen Type (ABG) ARTERIAL  Patient Temp (ABG) 37.0 (Result(s) reported on 09 May 2014 at 04:25PM.)  20-Sep-15 14:50   pH (ABG) 7.43 (7.350-7.450 NOTE: New Reference Range 03/15/14)  PCO2  52 (32-48 NOTE: New Reference Range 04/01/14)  PO2  69 (83-108 NOTE: New Reference Range 03/15/14)  FiO2 44  Base Excess  8.5 (-3-3 NOTE: New Reference Range 04/01/14)  HCO3  34.5 (21.0-28.0 NOTE: New Reference Range 03/15/14)  O2 Saturation 94.0  O2 Device [place]  Specimen Site (ABG) RT RADIAL  Specimen Type (ABG) ARTERIAL  Patient Temp (ABG) 37.0 (Result(s) reported on 11 May 2014 at 02:57PM.)  Routine Chem:  14-Sep-15 22:57   Glucose, Serum  127  BUN 10  Creatinine (comp) 0.91  Sodium, Serum 137  Potassium, Serum  3.4  Chloride, Serum  99  CO2, Serum  33  Calcium (Total), Serum 8.6  Anion Gap  5  Osmolality (calc) 274  eGFR (African American) >60  eGFR (Non-African American) >60 (eGFR values <61m/min/1.73 m2 may be an indication of chronic kidney disease (CKD). Calculated eGFR is useful in patients with stable renal function. The eGFR calculation will not be reliable in acutely ill patients when serum creatinine is changing rapidly. It is not useful in  patients on dialysis. The eGFR calculation may not be  applicable to patients at the low and high extremes of body sizes, pregnant women, and vegetarians.)  16-Sep-15 07:17   Glucose, Serum  244  BUN 11  Creatinine (comp) 0.87  Sodium, Serum 137  Potassium, Serum 3.8  Chloride, Serum 103  CO2, Serum 30  Calcium (Total), Serum 8.7  Anion Gap  4  Osmolality (calc) 281  eGFR (African American) >60  eGFR (Non-African American) >60 (eGFR values <653mmin/1.73 m2 may be an indication of chronic kidney disease (CKD). Calculated eGFR is useful in patients with stable renal function. The eGFR calculation will not be reliable in acutely ill patients when serum creatinine is changing rapidly. It is not useful in  patients on dialysis. The eGFR calculation may not be applicable to patients at the low and high extremes of body sizes, pregnant women, and vegetarians.)  18-Sep-15 04:00   Creatinine (comp) 0.87  eGFR (African American) >60  eGFR (Non-African American) >60 (eGFR values <6079min/1.73 m2 may be an indication of chronic kidney disease (CKD). Calculated eGFR is useful in patients with stable renal function. The eGFR calculation will not be reliable in acutely ill patients when serum creatinine is changing rapidly. It is not useful in  patients on dialysis. The eGFR calculation may not be applicable to patients at the low and high extremes of body sizes, pregnant women, and vegetarians.)  19-Sep-15 05:44   Glucose, Serum  262  BUN 16  Creatinine (comp) 1.01  Sodium, Serum 137  Potassium, Serum 4.2  Chloride, Serum 101  CO2, Serum  33  Calcium (Total), Serum  8.3  Anion Gap  3  Osmolality (calc) 284  eGFR (African American) >60  eGFR (Non-African American) >60 (eGFR values <31m8mn/1.73 m2 may be an indication of chronic kidney disease (CKD). Calculated eGFR is useful in patients with stable renal function. The eGFR calculation will not be reliable in acutely ill patients when serum creatinine is changing rapidly. It is not  useful in  patients on dialysis. The eGFR calculation may not be applicable to patients at the low and high extremes of body sizes, pregnant women, and vegetarians.)  Cardiac:  14-Sep-15 22:57   CK, Total  24 (39-308 NOTE: NEW REFERENCE RANGE  09/23/2013)  CPK-MB, Serum 0.6 (Result(s) reported on 05 May 2014 at 11:37PM.)  Troponin I < 0.02 (0.00-0.05 0.05 ng/mL or less: NEGATIVE  Repeat testing in 3-6 hrs  if clinically indicated. >0.05 ng/mL: POTENTIAL  MYOCARDIAL INJURY. Repeat  testing in 3-6 hrs if  clinically indicated. NOTE: An increase or decrease  of 30% or more on serial  testing suggests a  clinically important change)  Routine Hem:  14-Sep-15 22:57   WBC (CBC) 10.1  RBC (CBC)  4.19  Hemoglobin (CBC)  12.4  Hematocrit (CBC)  37.5  Platelet Count (CBC) 263 (Result(s) reported on 05 May 2014 at 11:15PM.)  MCV 89  MCH 29.7  MCHC 33.2  RDW 12.8  16-Sep-15 07:17   WBC (CBC)  13.7  RBC (CBC)  3.90  Hemoglobin (CBC)  11.4  Hematocrit (CBC)  34.4  Platelet Count (CBC) 257  MCV 88  MCH 29.2  MCHC 33.2  RDW 13.0  Neutrophil % 95.0  Lymphocyte % 1.9  Monocyte % 3.0  Eosinophil % 0.0  Basophil % 0.1  Neutrophil #  13.0  Lymphocyte #  0.3  Monocyte # 0.4  Eosinophil # 0.0  Basophil # 0.0 (Result(s) reported on 07 May 2014 at 07:36AM.)  19-Sep-15 05:44   WBC (CBC) 8.6  RBC (CBC)  4.27  Hemoglobin (CBC)  12.3  Hematocrit (CBC)  38.6  Platelet Count (CBC) 276  MCV 90  MCH 28.9  MCHC 32.0  RDW 13.3  Neutrophil % 93.6  Lymphocyte % 2.1  Monocyte % 3.8  Eosinophil % 0.1  Basophil % 0.4  Neutrophil #  8.1  Lymphocyte #  0.2  Monocyte # 0.3  Eosinophil # 0.0  Basophil # 0.0 (Result(s) reported on 10 May 2014 at 01:12PM.)   PERTINENT RADIOLOGY STUDIES: XRay:    14-Sep-15 22:40, Chest PA and Lateral  Chest PA and Lateral   REASON FOR EXAM:    Shortness of Breath  COMMENTS:   May transport without cardiac monitor    PROCEDURE: DXR - DXR CHEST PA (OR  AP) AND LATERAL  - May 05 2014 10:40PM     CLINICAL DATA:  Difficulty breathing.    EXAM:  CHEST  2 VIEW    COMPARISON:  Chest radiograph performed 07/25/2013    FINDINGS:  The lungs are well-aerated. Recurrent or persistentairspace  opacities are noted at the lower lung zones bilaterally. This may  reflect chronic interstitial lung disease, perhaps slightly worsened  from the prior study, or possibly acute infection. There is no  evidence of pleural effusion or pneumothorax.    The heart is mildly enlarged. No acute osseous abnormalities are  seen. A ventriculoperitoneal shunt is noted overlying the left  hemithorax.     IMPRESSION:  1. Recurrent or persistent airspace opacities at the lower lung  zones bilaterally. This may reflect mildly worsened chronic  interstitial lung disease, or possibly acute infection.  2. Mild cardiomegaly.    Electronically Signed   By: Garald Balding M.D.    On: 05/05/2014 22:50         Verified By: JEFFREY . CHANG, M.D.,    16-Sep-15 15:40, Chest Portable Single View  Chest Portable Single View   REASON FOR EXAM:    hypoxia  COMMENTS:       PROCEDURE: DXR - DXR PORTABLE CHEST SINGLE VIEW  - May 07 2014  3:40PM     CLINICAL DATA:  Hypoxia.    EXAM:  PORTABLE CHEST - 1 VIEW    COMPARISON:  05/05/2014 hand 07/25/2013    FINDINGS:  Left-sided VP shunt catheter unchanged. Lungs are adequately  inflated with continued diffuse bilateral interstitial airspace  process unchanged from the recent prior study but slightly worse  compared to December 2014 with more mixed interstitial airspace  densityover the right lung. No definite effusion. Cardiomediastinal  silhouette and remainder of the exam is unchanged.     IMPRESSION:  Chronic hazy interstitial process bilaterally with mild mixed  interstitial airspace density over the right midlung without  significant change from the recent prior exam. Findings may be due  to acute infection  superimposed on chronic interstitial disease  versus interval progression of patient's interstitial lung disease.      Electronically Signed    By: Marin Olp M.D.  On: 05/07/2014 16:39         Verified By: Pearletha Alfred, M.D.,    19-Sep-15 13:30, Chest PA and Lateral  Chest PA and Lateral   REASON FOR EXAM:    pulmonary edema.  COMMENTS:       PROCEDURE: DXR - DXR CHEST PA (OR AP) AND LATERAL  - May 10 2014  1:30PM     CLINICAL DATA:  Severe abdominal pain, nausea, and vomiting.  Pulmonary edema.    EXAM:  CHEST  2 VIEW    COMPARISON:  On 05/07/2014    FINDINGS:  VP shunt catheter is partially visualized traversing the left lower  neck, left chest, and coursing into the left abdomen.  Cardiomediastinal silhouette is unchanged. Diffuse bilateral  interstitial opacities do not appear significantly changed, with  more confluent, airspace type opacities projecting in the mid and  lower lungs bilaterally. No definite new airspace consolidation is  seen. No pleural effusion or pneumothorax is identified. No acute  osseous abnormality is seen.     IMPRESSION:  Unchanged appearance of the chest with bilateral interstitial and  airspace opacities. Considerations include recent worsening of  underlying chronic interstitial lung disease as well as acute  infection or edema superimposedon underlying chronic lung disease.  No pleural effusion.  Electronically Signed    By: Logan Bores    On: 05/10/2014 13:39         Verified By: Ferol Luz, M.D.,  [place]:    14-Sep-15 22:40, Chest PA and Lateral  PACS Image     16-Sep-15 15:40, Chest Portable Single View  PACS Image     19-Sep-15 13:30, Chest PA and Lateral  PACS Image     20-Sep-15 11:07, CT Head Without Contrast  PACS Image   CT:  CT Head Without Contrast   REASON FOR EXAM:    Neurological Changes  COMMENTS:       PROCEDURE: CT  - CT HEAD WITHOUT CONTRAST  - May 11 2014 11:07AM     CLINICAL DATA:   Mental status change; history of previous  subarachnoid hemorrhage and CVA with subsequent shunt placement    EXAM:  CT HEAD WITHOUT CONTRAST    TECHNIQUE:  Contiguous axial images were obtained from the base of the skull  through the vertex without intravenous contrast.  COMPARISON:  Noncontrast CT scan of brain of November 20, 2011    FINDINGS:  There is a ventricular shunt in place with the tip of the tube in  the posterior aspect of the right frontal horn. The shunt tube  traverses the left frontal lobe and exits the calvarium through a  left frontal burr hole. There is mild ventriculomegaly of the  lateral and third ventricles today as compared to the previous  study. There is no shift of the midline. There is no acute  intracranial hemorrhage nor evidence of acute ischemic change. There  are stable deep white matter hypodensities consistent withchronic  small vessel ischemia. The cerebellum and brainstem are  unremarkable.    The observed paranasal sinuses exhibit mild mucoperiosteal  thickening in the ethmoid and sphenoid regions. The mastoid air  cells are well pneumatized. There is no acute skull fracture.     IMPRESSION:  1. Since the previous study mild ventriculomegaly involving the  lateral and third ventricles has developed. The fourth ventricle is  stable.  2. There is no acute intracranial hemorrhage nor evidence of acute  ischemic change. There are stable changes of  chronic small vessel  ischemia.  3. There are mild inflammatory changes of the ethmoid and sphenoid  sinuses.      Electronically Signed    By: David  Martinique    On: 05/11/2014 11:16         Verified By: DAVID A. [place]ique, M.D., MD   Pertinent Past History:  Pertinent Past History Nonspecific interstitial pneumonia, chronic respiratory failure on 3 L nasal cannula at baseline, history of CVA without residual neurologic deficit, hypothyroidism, hyperlipidemia, history of GERD.   Hospital  Course:  Hospital Course 57y/oM with chronic COPD on 3L home o2, chronic pain syndrome admitted for COPD exacerbation and pneumonia  * altered mental status- metabolic encephalopathy-     may be due to combination - hypoxia and his CNS issue.   was on valium at home- held it for last 2-3 days, on xanax PRN- received only 0.25 mg last night.   On morphine sulfate contin - hold due to mental status.   repeated ABG- did not seem to be the sourse of this.   Ordered CT and reviewed- Some dilated Ventricles- have shunt in brain.   I spoke to neutologist Dr. Velia Meyer- suggest to speak to neuro surgery- as may need intervention.   [facility]- spoke to neuro surgery- Dr. Joya Salm- suggested to admit to medical and they will do consult.   Spoke to hispitalist Dr. Dennison Nancy- as per her - due to lethargy- pt is not for regular floor- and may need step down or CCU.   Spoke to [employer] - he accepted the pt- awaited transfer.  * ACute hypoxic resp fialure- secondary to COPD and pneumonia underlying idiopathic pulm fibrosis- NSIP- biopsy proven blood cultures negative, on vanc and zosyn IV solumedrol- pulm consult appreciated. continue spiriva and nebs azathiprine for his NSIP- continue  Initially required NRBM, back on 6l now, at home 3l o2   have some crepitatins- held IV fluids, repeat Xray chest- shows bronchitis Vs edema.  * Chronic pain syndrome- continue home pain meds- held MS contin, nucynta  * Hx of CVA and some ventricular flow obstruction with shunt placement by neuro sx in past.   Some dilation of lateral and third ventrical.   As above - will be seen by neurosurgery at [place].  * Hyperlipidemia- on statin  * Hypothyroidism- armour thryoid Awaited ransfer at [place]- meanwhile cont treatment here. explained his wife in room, about this plan.   Condition on Discharge Guarded   Code Status:  Code Status Full Code   DISCHARGE INSTRUCTIONS HOME MEDS:   Medication Reconciliation: Patient's Home Medications at Discharge:     Medication Instructions  armour thyroid 90 mg oral tablet  1 tab(s) orally once a day   atorvastatin 20 mg oral tablet  1 tab(s) orally once a day   nucynta 75 mg oral tablet  1 tab(s) orally every 4 to 6 hours, As Needed   sertraline 100 mg oral tablet  1.5 tab(s) orally once a day   omeprazole 20 mg oral delayed release capsule  1 cap(s) orally once a day   vitamin d2 50,000 intl units oral capsule  1 cap(s) orally 1 time a week   tiotropium 18 mcg inhalation capsule  1 cap(s) inhaled once a day   methylprednisolone  60 milligram(s) injectable every 8 hours   acetaminophen 325 mg oral tablet  2 tab(s) orally every 4 hours, As needed, mild pain (1-3/10) or temp. greater than 100.4   albuterol-ipratropium  2.5 mg-0.5 mg/3 ml inhalation solution  3 milliliter(s) inhaled 3 to 4 times a day   vancomycin  1000 milligram(s) intravenous once a day (in the evening)   azathioprine 50 mg oral tablet  1 tab(s) orally once a day   piperacillin-tazobactam  4.5 gram(s) intravenous every 8 hours    PRESCRIPTIONS: ELECTRONICALLY SUBMITTED  STOP TAKING THE FOLLOWING MEDICATION(S):    morphine 100 mg/12 hours oral tablet, extended release: 1 tab(s) orally every 8 hours diazepam 5 mg oral tablet: 1 tab(s) orally every 8 hours, As needed, anxiety. Comments:  prednisone 10 mg oral tablet: 1.5 tab(s) orally once a day albuterol:  inhaled   Physician's Instructions:  Diet Low Sodium   Activity Limitations As tolerated   Return to Work Not Applicable   Time frame for Follow Up Appointment 1-2 days  Nuerosurgery   Other Comments Being transferred to [facility]- need neuro surgery evaluation there.     Reginia Forts M(Family Physician): [facility] [place], [address], [place], [place] [address], [phone]  Electronic Signatures: [name] (MD)  (Signed 20-Sep-15 15:17)  Authored:  ADMISSION DATE AND DIAGNOSIS, CHIEF COMPLAINT/HPI, Allergies, PERTINENT LABS, PERTINENT RADIOLOGY STUDIES, PERTINENT PAST HISTORY, HOSPITAL COURSE, DISCHARGE INSTRUCTIONS HOME MEDS, PATIENT INSTRUCTIONS, Follow Up Physician   Last Updated: 20-Sep-15 15:17 by [name] (MD)

## 2014-12-13 NOTE — H&P (Signed)
PATIENT NAME:  Kirk Robinson, Kirk Robinson MR#:  [id number] DATE OF BIRTH:  [birth year]  DATE OF ADMISSION:  05/06/2014  REFERRING PHYSICIAN: Briant Sites. Joni Fears, MD   PRIMARY CARE PHYSICIAN: Renette Butters. Tamala Julian, MD  PULMONOLOGY: [place] Raul Del, MD   CHIEF COMPLAINT: Short of breath.   HISTORY OF PRESENT ILLNESS: A 59 year old Caucasian gentleman with a history of COPD, nonspecific interstitial pneumonia, and chronic respiratory failure on 3 L nasal cannula at baseline presenting with shortness of breath. Describes a 1-week duration of symptoms, essentially URI-like symptoms with nasal congestion, sore throat, as well as positive sick contacts in multiple family members. Saw pulmonary for the above symptoms, with concern for the possibility of worsening pulmonary issues. They increased his steroids, started him on amoxicillin. Despite those measures, 2 days ago he had worsening shortness of breath with associated confusion. Confirmed by family at bedside. Cough nonproductive, as well as fever measured at 101 degrees Fahrenheit at home. He was also noted to be hypoxemic despite his supplemental O2 at 3 L nasal cannula and thus brought to the hospital for further workup and evaluation. The patient himself is unable to provide meaningful information, given the confusion at this time. Most of the history obtained by wife at bedside.  REVIEW OF SYSTEMS: Unable to obtain given the patient's confusion.  PAST MEDICAL HISTORY: Nonspecific interstitial pneumonia, chronic respiratory failure on 3 L nasal cannula at baseline, history of CVA without residual neurologic deficit, hypothyroidism, hyperlipidemia, history of GERD.   SOCIAL HISTORY: Occasional tobacco use. Denies any alcohol or drug use.   FAMILY HISTORY: Denies any known cardiovascular or pulmonary disorders.   ALLERGIES: CONTRAST DYE, DOXYCYCLINE, ERYTHROMYCIN.   HOME MEDICATIONS: Include prednisone 10 mg p.o. q. daily, morphine 100 mg extended release p.o.  q. 8 hours, Nucynta 75 mg p.o. 4-6 hours as needed for pain, diazepam 5 mg p.o. q. 8 hours as needed for anxiety, sertraline 100 mg 1/2 tablet p.o. q. daily, atorvastatin 20 mg p.o. q. daily, ProAir 90 mcg inhalation 2 puffs 4 times daily as needed for shortness of breath, Spiriva 18 mcg inhalation once daily, azathioprine 50 mg p.o. q. daily, Prilosec 20 mg p.o. q. daily, Armour Thyroid 90 mg p.o. q. daily.   PHYSICAL EXAMINATION:  VITAL SIGNS: Temperature 99, heart rate 83, respirations 24, blood pressure 141/76, saturating 83% on supplemental O2, currently saturating 94% on supplemental O2. Weight 83.9 kg, BMI 27.3.  GENERAL: Well-nourished, well-developed, Caucasian male currently in minimal to moderate distress given respiratory status.  HEAD: Normocephalic, atraumatic.  EYES: Pupils equal, round, reactive to light. Extraocular muscles intact. No scleral icterus.  MOUTH: Dry mucous membranes. Dentition intact. No abnormalities.  EARS, NOSE, THROAT: Clear without exudates. No external lesions.  NECK: Supple. No thyromegaly. No nodules. No JVD.  PULMONARY: Diminished breath sounds throughout all lung fields. Scattered expiratory wheeze, most prominent noted in right lower lobe. There are fine crackles throughout lung fields, but once again, most prominent right lower, right middle lobe. No use of accessory muscles; however, tachypneic.  CHEST: Nontender to palpation.  CARDIOVASCULAR: S1, S2, regular rate and rhythm. No murmurs, rubs, or gallops. No edema. Pedal pulses 2+ bilaterally. GASTROINTESTINAL: Soft, nontender, nondistended. No masses. Positive bowel sounds. No hepatosplenomegaly.  MUSCULOSKELETAL: No swelling, clubbing, or edema. Range of motion full in all extremities.  NEUROLOGIC: Cranial nerves II through XII intact. No gross focal neurological deficits. Sensation intact. Reflexes intact.  SKIN: No ulceration, lesions, rash, cyanosis. Skin warm, dry. Turgor intact.  PSYCHIATRIC: Mood  and  affect blunted. He is awake, alert, oriented to person and place. He does have some difficulty concerning questions, mainly secondary to respiratory status. Insight and judgment appear to be intact, although this was difficult, given his difficulty answering questions.   LABORATORY DATA: Chest x-ray performed revealing bilateral interstitial infiltrates, increased from prior films in the right upper and right middle lobes. Remainder of laboratory data: Sodium 137, potassium 3.4, , bicarbonate 33, BUN 10, creatinine 0.91, glucose 127, albumin 2.7. WBC 10.1, hemoglobin 12.4, platelets of 263,000.   ASSESSMENT AND PLAN: A 59 year old Caucasian gentleman with a history of COPD and nonspecific interstitial pneumonia and chronic respiratory failure on 3 L nasal cannula at baseline presenting with shortness of breath.  1.  Acute on chronic respiratory failure with hypoxemia. Difficult to exclude pneumonia based on his symptoms. It is difficult to use his chest x-ray to rule in or out pneumonia, given his chronic fibrosis pattern. However, with the symptoms and his fever, I feel it is most appropriate to add antibiotic coverage. Continue with vancomycin and Zosyn as started in the Emergency Department. DuoNeb treatments q. 4 hours, Solu-Medrol 60 mg IV q. daily, supplemental O2 to keep SaO2 greater than 90%. Add incentive spirometry as he is able to tolerate.  2.  Hyperlipidemia. Continue statin therapy.  3.  Hypokalemia. Replace with goal of 4-5.  4.  Chronic pain. Continue with home dose of extended morphine.  5.  Hypothyroidism. Continue with Armour Thyroid.  6.  Venous thromboembolism prophylaxis. Heparin subcutaneous.  CODE STATUS: Patient is full code. I have discussed with the family at bedside.   TIME SPENT: 45 minutes.    ____________________________ Kirk Robinson. Kirk Dunsworth, MD dkh:ST D: 05/06/2014 00:53:48 ET T: 05/06/2014 01:44:07 ET JOB#: [id number]  cc: Kirk Robinson. Kirk Rieger, MD, <Dictator> Kirk Jutte Woodfin Ganja MD ELECTRONICALLY SIGNED 05/06/2014 20:28

## 2015-01-21 ENCOUNTER — Ambulatory Visit: Payer: 59 | Admitting: Family Medicine

## 2015-03-05 ENCOUNTER — Other Ambulatory Visit: Payer: Self-pay | Admitting: Family Medicine

## 2015-03-08 ENCOUNTER — Other Ambulatory Visit: Payer: Self-pay | Admitting: Family Medicine

## 2015-03-13 ENCOUNTER — Emergency Department (HOSPITAL_COMMUNITY): Payer: 59

## 2015-03-13 ENCOUNTER — Inpatient Hospital Stay (HOSPITAL_COMMUNITY)
Admission: EM | Admit: 2015-03-13 | Discharge: 2015-03-20 | DRG: 208 | Disposition: A | Discharge status: Home / Self Care | Payer: 59 | Attending: Internal Medicine | Admitting: Internal Medicine

## 2015-03-13 ENCOUNTER — Encounter (HOSPITAL_COMMUNITY): Payer: Self-pay | Admitting: Emergency Medicine

## 2015-03-13 DIAGNOSIS — Z6821 Body mass index (BMI) 21.0-21.9, adult: Secondary | ICD-10-CM

## 2015-03-13 DIAGNOSIS — M810 Age-related osteoporosis without current pathological fracture: Secondary | ICD-10-CM | POA: Diagnosis present

## 2015-03-13 DIAGNOSIS — I739 Peripheral vascular disease, unspecified: Secondary | ICD-10-CM | POA: Diagnosis present

## 2015-03-13 DIAGNOSIS — Z8673 Personal history of transient ischemic attack (TIA), and cerebral infarction without residual deficits: Secondary | ICD-10-CM

## 2015-03-13 DIAGNOSIS — Z79891 Long term (current) use of opiate analgesic: Secondary | ICD-10-CM

## 2015-03-13 DIAGNOSIS — J449 Chronic obstructive pulmonary disease, unspecified: Secondary | ICD-10-CM | POA: Diagnosis present

## 2015-03-13 DIAGNOSIS — Z7901 Long term (current) use of anticoagulants: Secondary | ICD-10-CM

## 2015-03-13 DIAGNOSIS — I4891 Unspecified atrial fibrillation: Secondary | ICD-10-CM | POA: Insufficient documentation

## 2015-03-13 DIAGNOSIS — Z9889 Other specified postprocedural states: Secondary | ICD-10-CM

## 2015-03-13 DIAGNOSIS — R4182 Altered mental status, unspecified: Secondary | ICD-10-CM | POA: Diagnosis not present

## 2015-03-13 DIAGNOSIS — T6591XA Toxic effect of unspecified substance, accidental (unintentional), initial encounter: Secondary | ICD-10-CM | POA: Diagnosis present

## 2015-03-13 DIAGNOSIS — E032 Hypothyroidism due to medicaments and other exogenous substances: Secondary | ICD-10-CM | POA: Diagnosis present

## 2015-03-13 DIAGNOSIS — E876 Hypokalemia: Secondary | ICD-10-CM | POA: Diagnosis present

## 2015-03-13 DIAGNOSIS — J9600 Acute respiratory failure, unspecified whether with hypoxia or hypercapnia: Secondary | ICD-10-CM | POA: Insufficient documentation

## 2015-03-13 DIAGNOSIS — J988 Other specified respiratory disorders: Secondary | ICD-10-CM

## 2015-03-13 DIAGNOSIS — E1165 Type 2 diabetes mellitus with hyperglycemia: Secondary | ICD-10-CM | POA: Diagnosis present

## 2015-03-13 DIAGNOSIS — Z888 Allergy status to other drugs, medicaments and biological substances status: Secondary | ICD-10-CM

## 2015-03-13 DIAGNOSIS — Z9911 Dependence on respirator [ventilator] status: Secondary | ICD-10-CM

## 2015-03-13 DIAGNOSIS — G9340 Encephalopathy, unspecified: Secondary | ICD-10-CM | POA: Diagnosis present

## 2015-03-13 DIAGNOSIS — Z86010 Personal history of colonic polyps: Secondary | ICD-10-CM

## 2015-03-13 DIAGNOSIS — E780 Pure hypercholesterolemia: Secondary | ICD-10-CM | POA: Diagnosis present

## 2015-03-13 DIAGNOSIS — Z7952 Long term (current) use of systemic steroids: Secondary | ICD-10-CM

## 2015-03-13 DIAGNOSIS — T507X1A Poisoning by analeptics and opioid receptor antagonists, accidental (unintentional), initial encounter: Secondary | ICD-10-CM | POA: Diagnosis present

## 2015-03-13 DIAGNOSIS — J13 Pneumonia due to Streptococcus pneumoniae: Secondary | ICD-10-CM | POA: Diagnosis not present

## 2015-03-13 DIAGNOSIS — E039 Hypothyroidism, unspecified: Secondary | ICD-10-CM | POA: Diagnosis present

## 2015-03-13 DIAGNOSIS — N179 Acute kidney failure, unspecified: Secondary | ICD-10-CM | POA: Diagnosis present

## 2015-03-13 DIAGNOSIS — J9602 Acute respiratory failure with hypercapnia: Secondary | ICD-10-CM | POA: Diagnosis present

## 2015-03-13 DIAGNOSIS — Z982 Presence of cerebrospinal fluid drainage device: Secondary | ICD-10-CM

## 2015-03-13 DIAGNOSIS — Z881 Allergy status to other antibiotic agents status: Secondary | ICD-10-CM

## 2015-03-13 DIAGNOSIS — J9601 Acute respiratory failure with hypoxia: Secondary | ICD-10-CM | POA: Insufficient documentation

## 2015-03-13 DIAGNOSIS — E440 Moderate protein-calorie malnutrition: Secondary | ICD-10-CM | POA: Insufficient documentation

## 2015-03-13 DIAGNOSIS — I459 Conduction disorder, unspecified: Secondary | ICD-10-CM | POA: Diagnosis present

## 2015-03-13 DIAGNOSIS — F411 Generalized anxiety disorder: Secondary | ICD-10-CM | POA: Diagnosis present

## 2015-03-13 DIAGNOSIS — F17210 Nicotine dependence, cigarettes, uncomplicated: Secondary | ICD-10-CM | POA: Diagnosis present

## 2015-03-13 DIAGNOSIS — K219 Gastro-esophageal reflux disease without esophagitis: Secondary | ICD-10-CM | POA: Diagnosis present

## 2015-03-13 DIAGNOSIS — I482 Chronic atrial fibrillation, unspecified: Secondary | ICD-10-CM | POA: Insufficient documentation

## 2015-03-13 DIAGNOSIS — G8929 Other chronic pain: Secondary | ICD-10-CM | POA: Diagnosis present

## 2015-03-13 DIAGNOSIS — E0590 Thyrotoxicosis, unspecified without thyrotoxic crisis or storm: Secondary | ICD-10-CM | POA: Diagnosis present

## 2015-03-13 LAB — URINALYSIS, ROUTINE W REFLEX MICROSCOPIC
Bilirubin Urine: NEGATIVE
Glucose, UA: NEGATIVE mg/dL
Hgb urine dipstick: NEGATIVE
Ketones, ur: 15 mg/dL — AB
Leukocytes, UA: NEGATIVE
Nitrite: NEGATIVE
Protein, ur: NEGATIVE mg/dL
Specific Gravity, Urine: 1.016 (ref 1.005–1.030)
Urobilinogen, UA: 1 mg/dL (ref 0.0–1.0)
pH: 8.5 — ABNORMAL HIGH (ref 5.0–8.0)

## 2015-03-13 LAB — AMMONIA: Ammonia: 22 umol/L (ref 9–35)

## 2015-03-13 LAB — COMPREHENSIVE METABOLIC PANEL
ALT: 20 U/L (ref 17–63)
AST: 22 U/L (ref 15–41)
Albumin: 3.8 g/dL (ref 3.5–5.0)
Alkaline Phosphatase: 75 U/L (ref 38–126)
Anion gap: 10 (ref 5–15)
BUN: 9 mg/dL (ref 6–20)
CO2: 28 mmol/L (ref 22–32)
Calcium: 9.4 mg/dL (ref 8.9–10.3)
Chloride: 97 mmol/L — ABNORMAL LOW (ref 101–111)
Creatinine, Ser: 0.78 mg/dL (ref 0.61–1.24)
GFR calc Af Amer: >60 mL/min (ref >60)
GFR calc non Af Amer: >60 mL/min (ref >60)
Glucose, Bld: 145 mg/dL — ABNORMAL HIGH (ref 65–99)
Potassium: 3.8 mmol/L (ref 3.5–5.1)
Sodium: 135 mmol/L (ref 135–145)
Total Bilirubin: 0.9 mg/dL (ref 0.3–1.2)
Total Protein: 7.2 g/dL (ref 6.5–8.1)

## 2015-03-13 LAB — CBG MONITORING, ED
Glucose-Capillary: 143 mg/dL — ABNORMAL HIGH (ref 65–99)
Glucose-Capillary: 149 mg/dL — ABNORMAL HIGH (ref 65–99)

## 2015-03-13 LAB — APTT: aPTT: 32 seconds (ref 24–37)

## 2015-03-13 LAB — CBC
HCT: 47.7 % (ref 39.0–52.0)
Hemoglobin: 16.6 g/dL (ref 13.0–17.0)
MCH: 29.5 pg (ref 26.0–34.0)
MCHC: 34.8 g/dL (ref 30.0–36.0)
MCV: 84.9 fL (ref 78.0–100.0)
Platelets: 213 10*3/uL (ref 150–400)
RBC: 5.62 MIL/uL (ref 4.22–5.81)
RDW: 14.1 % (ref 11.5–15.5)
WBC: 11.9 10*3/uL — ABNORMAL HIGH (ref 4.0–10.5)

## 2015-03-13 LAB — PROTIME-INR
INR: 1.09 (ref 0.00–1.49)
Prothrombin Time: 14.3 seconds (ref 11.6–15.2)

## 2015-03-13 MED ORDER — LIDOCAINE HCL (CARDIAC) 20 MG/ML IV SOLN
INTRAVENOUS | Status: AC
  Filled 2015-03-13: qty 5

## 2015-03-13 MED ORDER — LORAZEPAM 2 MG/ML IJ SOLN
INTRAMUSCULAR | Status: AC
  Administered 2015-03-13 (×2): 1 mg
  Filled 2015-03-13: qty 1

## 2015-03-13 MED ORDER — NALOXONE HCL 1 MG/ML IJ SOLN
INTRAMUSCULAR | Status: AC
  Administered 2015-03-13: 2 mg via INTRAVENOUS
  Filled 2015-03-13: qty 2

## 2015-03-13 MED ORDER — SUCCINYLCHOLINE CHLORIDE 20 MG/ML IJ SOLN
INTRAMUSCULAR | Status: AC
  Filled 2015-03-13: qty 1

## 2015-03-13 MED ORDER — ROCURONIUM BROMIDE 50 MG/5ML IV SOLN
INTRAVENOUS | Status: AC
  Filled 2015-03-13: qty 2

## 2015-03-13 MED ORDER — NALOXONE HCL 1 MG/ML IJ SOLN
1.0000 mg | Freq: Once | INTRAMUSCULAR | Status: AC
  Administered 2015-03-13: 2 mg via INTRAVENOUS

## 2015-03-13 MED ORDER — ETOMIDATE 2 MG/ML IV SOLN
INTRAVENOUS | Status: AC
  Filled 2015-03-13: qty 20

## 2015-03-13 MED ORDER — ETOMIDATE 2 MG/ML IV SOLN
INTRAVENOUS | Status: AC
  Administered 2015-03-13: 10 mg
  Filled 2015-03-13: qty 20

## 2015-03-13 MED ORDER — SUCCINYLCHOLINE CHLORIDE 20 MG/ML IJ SOLN
INTRAMUSCULAR | Status: AC
  Administered 2015-03-13: 100 mg
  Filled 2015-03-13: qty 1

## 2015-03-13 MED ORDER — PROPOFOL 10 MG/ML IV BOLUS
INTRAVENOUS | Status: AC
  Filled 2015-03-13: qty 20

## 2015-03-13 MED ORDER — ROCURONIUM BROMIDE 50 MG/5ML IV SOLN
INTRAVENOUS | Status: AC
  Administered 2015-03-13: 100 mg
  Filled 2015-03-13: qty 2
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
## 2015-03-13 NOTE — ED Notes (Addendum)
CBG 149. 

## 2015-03-13 NOTE — ED Notes (Signed)
Gave 2mg  narcan per MD verbal order. Pt became agitated. MD gave order for 1mg  ativan. Pt lost gag relfex. Notified respiratory for intubation. Respiratory at bedside.

## 2015-03-13 NOTE — ED Notes (Signed)
Pt arrives via EMS from home with R sided headache. Family called out d/t not answering the phone all day. Shunt in place - when shunt requires maintenance, pt becomes lethargic. Pt mildly confused. Always on 3L O2.Kirk Robinson

## 2015-03-13 NOTE — ED Notes (Signed)
Miller, MD at bedside.  

## 2015-03-14 ENCOUNTER — Inpatient Hospital Stay (HOSPITAL_COMMUNITY): Payer: 59

## 2015-03-14 ENCOUNTER — Emergency Department (HOSPITAL_COMMUNITY): Payer: 59

## 2015-03-14 DIAGNOSIS — Z888 Allergy status to other drugs, medicaments and biological substances status: Secondary | ICD-10-CM | POA: Diagnosis not present

## 2015-03-14 DIAGNOSIS — E876 Hypokalemia: Secondary | ICD-10-CM | POA: Diagnosis present

## 2015-03-14 DIAGNOSIS — E039 Hypothyroidism, unspecified: Secondary | ICD-10-CM | POA: Diagnosis present

## 2015-03-14 DIAGNOSIS — I480 Paroxysmal atrial fibrillation: Secondary | ICD-10-CM | POA: Diagnosis not present

## 2015-03-14 DIAGNOSIS — I4891 Unspecified atrial fibrillation: Secondary | ICD-10-CM | POA: Diagnosis present

## 2015-03-14 DIAGNOSIS — K219 Gastro-esophageal reflux disease without esophagitis: Secondary | ICD-10-CM | POA: Diagnosis present

## 2015-03-14 DIAGNOSIS — R410 Disorientation, unspecified: Secondary | ICD-10-CM | POA: Diagnosis not present

## 2015-03-14 DIAGNOSIS — G8929 Other chronic pain: Secondary | ICD-10-CM | POA: Diagnosis present

## 2015-03-14 DIAGNOSIS — I459 Conduction disorder, unspecified: Secondary | ICD-10-CM | POA: Diagnosis present

## 2015-03-14 DIAGNOSIS — E440 Moderate protein-calorie malnutrition: Secondary | ICD-10-CM | POA: Diagnosis present

## 2015-03-14 DIAGNOSIS — Z9911 Dependence on respirator [ventilator] status: Secondary | ICD-10-CM | POA: Diagnosis not present

## 2015-03-14 DIAGNOSIS — T5791XD Toxic effect of unspecified inorganic substance, accidental (unintentional), subsequent encounter: Secondary | ICD-10-CM | POA: Diagnosis not present

## 2015-03-14 DIAGNOSIS — N179 Acute kidney failure, unspecified: Secondary | ICD-10-CM | POA: Diagnosis present

## 2015-03-14 DIAGNOSIS — Z6821 Body mass index (BMI) 21.0-21.9, adult: Secondary | ICD-10-CM | POA: Diagnosis not present

## 2015-03-14 DIAGNOSIS — Z8673 Personal history of transient ischemic attack (TIA), and cerebral infarction without residual deficits: Secondary | ICD-10-CM | POA: Diagnosis not present

## 2015-03-14 DIAGNOSIS — T5791XA Toxic effect of unspecified inorganic substance, accidental (unintentional), initial encounter: Secondary | ICD-10-CM | POA: Diagnosis not present

## 2015-03-14 DIAGNOSIS — T6591XA Toxic effect of unspecified substance, accidental (unintentional), initial encounter: Secondary | ICD-10-CM | POA: Diagnosis present

## 2015-03-14 DIAGNOSIS — Z982 Presence of cerebrospinal fluid drainage device: Secondary | ICD-10-CM | POA: Diagnosis not present

## 2015-03-14 DIAGNOSIS — T5791XS Toxic effect of unspecified inorganic substance, accidental (unintentional), sequela: Secondary | ICD-10-CM | POA: Diagnosis not present

## 2015-03-14 DIAGNOSIS — Z86010 Personal history of colonic polyps: Secondary | ICD-10-CM | POA: Diagnosis not present

## 2015-03-14 DIAGNOSIS — G9340 Encephalopathy, unspecified: Secondary | ICD-10-CM | POA: Diagnosis present

## 2015-03-14 DIAGNOSIS — J13 Pneumonia due to Streptococcus pneumoniae: Secondary | ICD-10-CM | POA: Diagnosis present

## 2015-03-14 DIAGNOSIS — J988 Other specified respiratory disorders: Secondary | ICD-10-CM | POA: Diagnosis not present

## 2015-03-14 DIAGNOSIS — Z7952 Long term (current) use of systemic steroids: Secondary | ICD-10-CM | POA: Diagnosis not present

## 2015-03-14 DIAGNOSIS — T507X1A Poisoning by analeptics and opioid receptor antagonists, accidental (unintentional), initial encounter: Secondary | ICD-10-CM | POA: Diagnosis present

## 2015-03-14 DIAGNOSIS — Z881 Allergy status to other antibiotic agents status: Secondary | ICD-10-CM | POA: Diagnosis not present

## 2015-03-14 DIAGNOSIS — E032 Hypothyroidism due to medicaments and other exogenous substances: Secondary | ICD-10-CM | POA: Diagnosis present

## 2015-03-14 DIAGNOSIS — J449 Chronic obstructive pulmonary disease, unspecified: Secondary | ICD-10-CM | POA: Diagnosis present

## 2015-03-14 DIAGNOSIS — F17210 Nicotine dependence, cigarettes, uncomplicated: Secondary | ICD-10-CM | POA: Diagnosis present

## 2015-03-14 DIAGNOSIS — R401 Stupor: Secondary | ICD-10-CM | POA: Diagnosis not present

## 2015-03-14 DIAGNOSIS — E0590 Thyrotoxicosis, unspecified without thyrotoxic crisis or storm: Secondary | ICD-10-CM | POA: Diagnosis present

## 2015-03-14 DIAGNOSIS — M810 Age-related osteoporosis without current pathological fracture: Secondary | ICD-10-CM | POA: Diagnosis present

## 2015-03-14 DIAGNOSIS — E1165 Type 2 diabetes mellitus with hyperglycemia: Secondary | ICD-10-CM | POA: Diagnosis present

## 2015-03-14 DIAGNOSIS — R4182 Altered mental status, unspecified: Secondary | ICD-10-CM | POA: Diagnosis present

## 2015-03-14 DIAGNOSIS — Z7901 Long term (current) use of anticoagulants: Secondary | ICD-10-CM | POA: Diagnosis not present

## 2015-03-14 DIAGNOSIS — J9601 Acute respiratory failure with hypoxia: Secondary | ICD-10-CM | POA: Diagnosis present

## 2015-03-14 DIAGNOSIS — E780 Pure hypercholesterolemia: Secondary | ICD-10-CM | POA: Diagnosis present

## 2015-03-14 DIAGNOSIS — F411 Generalized anxiety disorder: Secondary | ICD-10-CM | POA: Diagnosis present

## 2015-03-14 DIAGNOSIS — J9602 Acute respiratory failure with hypercapnia: Secondary | ICD-10-CM | POA: Diagnosis present

## 2015-03-14 DIAGNOSIS — Z79891 Long term (current) use of opiate analgesic: Secondary | ICD-10-CM | POA: Diagnosis not present

## 2015-03-14 DIAGNOSIS — J9600 Acute respiratory failure, unspecified whether with hypoxia or hypercapnia: Secondary | ICD-10-CM | POA: Diagnosis not present

## 2015-03-14 DIAGNOSIS — I739 Peripheral vascular disease, unspecified: Secondary | ICD-10-CM | POA: Diagnosis present

## 2015-03-14 LAB — COMPREHENSIVE METABOLIC PANEL
ALT: 19 U/L (ref 17–63)
ALT: 19 U/L (ref 17–63)
AST: 24 U/L (ref 15–41)
AST: 27 U/L (ref 15–41)
Albumin: 3.8 g/dL (ref 3.5–5.0)
Albumin: 3.5 g/dL (ref 3.5–5.0)
Alkaline Phosphatase: 78 U/L (ref 38–126)
Alkaline Phosphatase: 74 U/L (ref 38–126)
Anion gap: 15 (ref 5–15)
Anion gap: 13 (ref 5–15)
BUN: 12 mg/dL (ref 6–20)
BUN: 14 mg/dL (ref 6–20)
CO2: 24 mmol/L (ref 22–32)
CO2: 27 mmol/L (ref 22–32)
Calcium: 9.3 mg/dL (ref 8.9–10.3)
Calcium: 8.6 mg/dL — ABNORMAL LOW (ref 8.9–10.3)
Chloride: 97 mmol/L — ABNORMAL LOW (ref 101–111)
Chloride: 97 mmol/L — ABNORMAL LOW (ref 101–111)
Creatinine, Ser: 0.75 mg/dL (ref 0.61–1.24)
Creatinine, Ser: 0.91 mg/dL (ref 0.61–1.24)
GFR calc Af Amer: >60 mL/min (ref >60)
GFR calc Af Amer: >60 mL/min (ref >60)
GFR calc non Af Amer: >60 mL/min (ref >60)
GFR calc non Af Amer: >60 mL/min (ref >60)
Glucose, Bld: 160 mg/dL — ABNORMAL HIGH (ref 65–99)
Glucose, Bld: 171 mg/dL — ABNORMAL HIGH (ref 65–99)
Potassium: 3.7 mmol/L (ref 3.5–5.1)
Potassium: 3.6 mmol/L (ref 3.5–5.1)
Sodium: 136 mmol/L (ref 135–145)
Sodium: 137 mmol/L (ref 135–145)
Total Bilirubin: 1.2 mg/dL (ref 0.3–1.2)
Total Bilirubin: 1 mg/dL (ref 0.3–1.2)
Total Protein: 7 g/dL (ref 6.5–8.1)
Total Protein: 6.8 g/dL (ref 6.5–8.1)

## 2015-03-14 LAB — BLOOD GAS, ARTERIAL
Acid-Base Excess: 5.3 mmol/L — ABNORMAL HIGH (ref 0.0–2.0)
Bicarbonate: 28.2 mEq/L — ABNORMAL HIGH (ref 20.0–24.0)
Drawn by: 283401
FIO2: 0.5 %
MECHVT: 580 mL
O2 Saturation: 98.5 %
PEEP: 5 cmH2O
Patient temperature: 98.6
RATE: 20 resp/min
TCO2: 29.3 mmol/L (ref 0–100)
pCO2 arterial: 33.9 mmHg — ABNORMAL LOW (ref 35.0–45.0)
pH, Arterial: 7.531 — ABNORMAL HIGH (ref 7.350–7.450)
pO2, Arterial: 115 mmHg — ABNORMAL HIGH (ref 80.0–100.0)

## 2015-03-14 LAB — CBC
HCT: 44.8 % (ref 39.0–52.0)
Hemoglobin: 15.7 g/dL (ref 13.0–17.0)
MCH: 29.7 pg (ref 26.0–34.0)
MCHC: 35 g/dL (ref 30.0–36.0)
MCV: 84.7 fL (ref 78.0–100.0)
Platelets: 254 10*3/uL (ref 150–400)
RBC: 5.29 MIL/uL (ref 4.22–5.81)
RDW: 14.1 % (ref 11.5–15.5)
WBC: 14.1 10*3/uL — ABNORMAL HIGH (ref 4.0–10.5)

## 2015-03-14 LAB — POCT I-STAT 3, ART BLOOD GAS (G3+)
Acid-Base Excess: 2 mmol/L (ref 0.0–2.0)
Bicarbonate: 25.7 mEq/L — ABNORMAL HIGH (ref 20.0–24.0)
O2 Saturation: 100 %
Patient temperature: 99
TCO2: 27 mmol/L (ref 0–100)
pCO2 arterial: 37.2 mmHg (ref 35.0–45.0)
pH, Arterial: 7.448 (ref 7.350–7.450)
pO2, Arterial: 205 mmHg — ABNORMAL HIGH (ref 80.0–100.0)

## 2015-03-14 LAB — GLUCOSE, CAPILLARY
Glucose-Capillary: 145 mg/dL — ABNORMAL HIGH (ref 65–99)
Glucose-Capillary: 170 mg/dL — ABNORMAL HIGH (ref 65–99)

## 2015-03-14 LAB — I-STAT ARTERIAL BLOOD GAS, ED
Acid-Base Excess: 4 mmol/L — ABNORMAL HIGH (ref 0.0–2.0)
Bicarbonate: 32.1 mEq/L — ABNORMAL HIGH (ref 20.0–24.0)
O2 Saturation: 100 %
Patient temperature: 98.1
TCO2: 34 mmol/L (ref 0–100)
pCO2 arterial: 59.4 mmHg (ref 35.0–45.0)
pH, Arterial: 7.339 — ABNORMAL LOW (ref 7.350–7.450)
pO2, Arterial: 442 mmHg — ABNORMAL HIGH (ref 80.0–100.0)

## 2015-03-14 LAB — RAPID URINE DRUG SCREEN, HOSP PERFORMED
Amphetamines: NOT DETECTED
Barbiturates: NOT DETECTED
Benzodiazepines: POSITIVE — AB
Cocaine: NOT DETECTED
Opiates: POSITIVE — AB
Tetrahydrocannabinol: NOT DETECTED

## 2015-03-14 LAB — TRIGLYCERIDES: Triglycerides: 582 mg/dL — ABNORMAL HIGH (ref <150)

## 2015-03-14 LAB — ACETAMINOPHEN LEVEL: Acetaminophen (Tylenol), Serum: <10 ug/mL — ABNORMAL LOW (ref 10–30)

## 2015-03-14 LAB — ETHANOL: Alcohol, Ethyl (B): <5 mg/dL (ref <5)

## 2015-03-14 LAB — TSH: TSH: 1.754 u[IU]/mL (ref 0.350–4.500)

## 2015-03-14 LAB — MAGNESIUM: Magnesium: 1.9 mg/dL (ref 1.7–2.4)

## 2015-03-14 LAB — SALICYLATE LEVEL: Salicylate Lvl: <4 mg/dL (ref 2.8–30.0)

## 2015-03-14 LAB — CK: Total CK: 16 U/L — ABNORMAL LOW (ref 49–397)

## 2015-03-14 LAB — LACTIC ACID, PLASMA: Lactic Acid, Venous: 1.6 mmol/L (ref 0.5–2.0)

## 2015-03-14 LAB — MRSA PCR SCREENING: MRSA by PCR: NEGATIVE

## 2015-03-14 MED ORDER — FENTANYL CITRATE (PF) 2500 MCG/50ML IJ SOLN
25.0000 ug/h | INTRAMUSCULAR | Status: DC
  Administered 2015-03-14: 25 ug/h via INTRAVENOUS
  Filled 2015-03-14: qty 50

## 2015-03-14 MED ORDER — FENTANYL CITRATE (PF) 100 MCG/2ML IJ SOLN
INTRAMUSCULAR | Status: AC
  Filled 2015-03-14: qty 2

## 2015-03-14 MED ORDER — METHYLPREDNISOLONE SODIUM SUCC 125 MG IJ SOLR
60.0000 mg | Freq: Four times a day (QID) | INTRAMUSCULAR | Status: DC
  Administered 2015-03-14 – 2015-03-15 (×2): 60 mg via INTRAVENOUS
  Filled 2015-03-14 (×4): qty 0.96
  Filled 2015-03-14 (×2): qty 2

## 2015-03-14 MED ORDER — ALBUTEROL SULFATE (2.5 MG/3ML) 0.083% IN NEBU
2.5000 mg | INHALATION_SOLUTION | Freq: Four times a day (QID) | RESPIRATORY_TRACT | Status: DC
  Administered 2015-03-14 (×4): 2.5 mg via RESPIRATORY_TRACT
  Filled 2015-03-14 (×4): qty 3

## 2015-03-14 MED ORDER — SODIUM CHLORIDE 0.9 % IV SOLN: 250.0000 mL | INTRAVENOUS | Status: DC | PRN

## 2015-03-14 MED ORDER — PROPOFOL 1000 MG/100ML IV EMUL
5.0000 ug/kg/min | INTRAVENOUS | Status: DC
  Administered 2015-03-14: 30 ug/kg/min via INTRAVENOUS
  Administered 2015-03-14 (×2): 50 ug/kg/min via INTRAVENOUS
  Administered 2015-03-14: 25 ug/kg/min via INTRAVENOUS
  Filled 2015-03-14 (×4): qty 100

## 2015-03-14 MED ORDER — SODIUM CHLORIDE 0.9 % IV SOLN
INTRAVENOUS | Status: AC
  Administered 2015-03-14: 23:00:00 via INTRAVENOUS

## 2015-03-14 MED ORDER — PROPOFOL 1000 MG/100ML IV EMUL
5.0000 ug/kg/min | Freq: Once | INTRAVENOUS | Status: DC
  Administered 2015-03-13: via INTRAVENOUS
  Administered 2015-03-14: 65 ug/kg/min via INTRAVENOUS

## 2015-03-14 MED ORDER — CETYLPYRIDINIUM CHLORIDE 0.05 % MT LIQD
7.0000 mL | Freq: Four times a day (QID) | OROMUCOSAL | Status: DC
  Administered 2015-03-14 – 2015-03-20 (×21): 7 mL via OROMUCOSAL

## 2015-03-14 MED ORDER — ALBUTEROL SULFATE (2.5 MG/3ML) 0.083% IN NEBU
INHALATION_SOLUTION | RESPIRATORY_TRACT | Status: AC
  Administered 2015-03-14: 2.5 mg via RESPIRATORY_TRACT
  Filled 2015-03-14: qty 3

## 2015-03-14 MED ORDER — DEXTROSE 5 % IV SOLN
30.0000 ug/min | INTRAVENOUS | Status: DC
  Administered 2015-03-14: 30 ug/min via INTRAVENOUS
  Filled 2015-03-14 (×2): qty 1

## 2015-03-14 MED ORDER — SODIUM CHLORIDE 0.9 % IV SOLN
INTRAVENOUS | Status: AC
  Administered 2015-03-14: 04:00:00 via INTRAVENOUS

## 2015-03-14 MED ORDER — TIOTROPIUM BROMIDE MONOHYDRATE 18 MCG IN CAPS
18.0000 ug | ORAL_CAPSULE | Freq: Every day | RESPIRATORY_TRACT | Status: DC
  Filled 2015-03-14: qty 5

## 2015-03-14 MED ORDER — ALBUTEROL SULFATE (2.5 MG/3ML) 0.083% IN NEBU
2.5000 mg | INHALATION_SOLUTION | RESPIRATORY_TRACT | Status: DC
  Administered 2015-03-14 – 2015-03-17 (×14): 2.5 mg via RESPIRATORY_TRACT
  Filled 2015-03-14 (×13): qty 3

## 2015-03-14 MED ORDER — HEPARIN SODIUM (PORCINE) 5000 UNIT/ML IJ SOLN
5000.0000 [IU] | Freq: Three times a day (TID) | INTRAMUSCULAR | Status: DC
  Administered 2015-03-14 – 2015-03-15 (×6): 5000 [IU] via SUBCUTANEOUS
  Filled 2015-03-14 (×8): qty 1

## 2015-03-14 MED ORDER — PANTOPRAZOLE SODIUM 40 MG PO PACK
40.0000 mg | PACK | Freq: Every day | ORAL | Status: DC
  Administered 2015-03-14 – 2015-03-17 (×4): 40 mg
  Filled 2015-03-14 (×5): qty 20

## 2015-03-14 MED ORDER — FENTANYL CITRATE (PF) 100 MCG/2ML IJ SOLN
25.0000 ug | INTRAMUSCULAR | Status: DC | PRN
  Administered 2015-03-14: 75 ug via INTRAVENOUS

## 2015-03-14 MED ORDER — CHLORHEXIDINE GLUCONATE 0.12 % MT SOLN
15.0000 mL | Freq: Two times a day (BID) | OROMUCOSAL | Status: DC
  Administered 2015-03-14 – 2015-03-20 (×11): 15 mL via OROMUCOSAL
  Filled 2015-03-14 (×11): qty 15

## 2015-03-14 MED ORDER — SODIUM CHLORIDE 0.9 % IV SOLN
INTRAVENOUS | Status: DC
  Filled 2015-03-14: qty 1000

## 2015-03-14 MED ORDER — VITAMIN D (ERGOCALCIFEROL) 1.25 MG (50000 UNIT) PO CAPS: 50000.0000 [IU] | ORAL_CAPSULE | ORAL | Status: DC
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
## 2015-03-14 NOTE — ED Notes (Signed)
Pt belongings including clothing and silver FOSSIL watch to be escorted up to room with patient.

## 2015-03-14 NOTE — Progress Notes (Signed)
eLink Physician-Brief Progress Note Patient Name: Kirk Robinson DOB: Jan 06, 1956 MRN: [id number]   Date of Service  03/14/2015  HPI/Events of Note  Patient admitted with MS change from OD.  On Vent.  Nurse called for tachycardia and sat of 92% on 40% FIO2 as decreasing propofol.  Patient on high dose morphine and benzo as outpatient.  eICU Interventions  EKG Continue to decrease propofol slowly Fentanyl as needed     Intervention Category Minor Interventions: Agitation / anxiety - evaluation and management  Mauri Brooklyn, P 03/14/2015, 5:24 PM

## 2015-03-14 NOTE — Progress Notes (Signed)
DR. Tamala Julian notified of changes noted in patient monitoring. Propofol drip decreased slowly throughout the day from 50 mcg to 84mcg IV, HR 150, 12 Lead EKG done. RR 22, sats 92, RT in to assess, sats remain 92% FIO2 increased to 50% per Respiratory therapy.  B/P 91/75. Patient's only response is yawning. Not following commands at this time. Fent 75 mcg IV push administered HR remains 145, sats 92% on 50% FIO2. RR 23, B/P 90/62 map 67.

## 2015-03-14 NOTE — Progress Notes (Signed)
[facility] called for update on pt since admitted. Update provided to representative.  States that he will call back later during the morning to check in again.  Will continue to monitor this patient closely.

## 2015-03-14 NOTE — ED Notes (Signed)
Admitting resident at bedside.

## 2015-03-14 NOTE — Progress Notes (Signed)
Initial Nutrition Assessment  INTERVENTION:    If unable to extubate patient within the next 24 hours, recommend initiate TF via OGT with Vital High Protein at 25 ml/h and Prostat 30 ml BID on day 1; on day 2, d/c Prostat and increase to goal rate of 55 ml/h (1320 ml per day) to provide 1320 kcals, 116 gm protein, 1104 ml free water daily.  Above TF regimen plus current kcals from Propofol would provide a total of 1808 kcals per day.  NUTRITION DIAGNOSIS:   Inadequate oral intake related to inability to eat as evidenced by NPO status.  GOAL:   Patient will meet greater than or equal to 90% of their needs  MONITOR:   Vent status, Labs, Weight trends  REASON FOR ASSESSMENT:   Ventilator    ASSESSMENT:   Patient admitted on 7/22 with AMS related to ingestion of opiods, ? overdose of thyroid medicine.  Labs reviewed.  Unable to complete Nutrition-Focused physical exam at this time.   Patient is currently intubated on ventilator support MV: 11.6 L/min Temp (24hrs), Avg:98.8 F (37.1 C), Min:98.1 F (36.7 C), Max:99.6 F (37.6 C)  Propofol: 18.5 ml/hr providing 488 kcals per day  Diet Order:  Diet NPO time specified  Skin:  Reviewed, no issues  Last BM:  unknown  Height:   Ht Readings from Last 1 Encounters:  03/14/15 5\' 10"  (1.778 m)    Weight:   Wt Readings from Last 1 Encounters:  03/14/15 154 lb 15.7 oz (70.3 kg)    Ideal Body Weight:  75.5 kg  Wt Readings from Last 10 Encounters:  03/14/15 154 lb 15.7 oz (70.3 kg)  10/22/14 182 lb (82.555 kg)  08/13/14 167 lb 15.9 oz (76.2 kg)  05/18/14 167 lb (75.751 kg)  05/14/14 167 lb 1.7 oz (75.8 kg)  02/26/14 186 lb 6.4 oz (84.55 kg)  01/14/13 170 lb (77.111 kg)  05/28/12 157 lb (71.215 kg)  04/16/12 157 lb (71.215 kg)  02/27/12 163 lb (73.936 kg)    BMI:  Body mass index is 22.24 kg/(m^2).  Estimated Nutritional Needs:   Kcal:  1908  Protein:  100-115 gm  Fluid:  2 L  EDUCATION NEEDS:   No  education needs identified at this time   Molli Barrows, [place], [place], [place] Pager [phone] After Hours Pager [phone]

## 2015-03-14 NOTE — ED Notes (Signed)
Spoke with Dr. Gerlean Ren about placing bed request. States she's working on it.

## 2015-03-14 NOTE — Progress Notes (Signed)
Chaplain was paged to support family as Pt was have a  Health change. When Chaplain arrived Pt family was being advised of Pt's condition. Chaplain waited while Pt was being prepared to be seen by family. Chaplain asked if family needed support and they said no. Chaplain  Escorted family into Pt's room. Chaplain again asked if they need any support, again they said no. Chaplain advised them to have nurse  call  if they needed any support.    03/14/15 0600  Clinical Encounter Type  Visited With Family  Visit Type Spiritual support  Referral From Care management  Spiritual Encounters  Spiritual Needs Emotional  Stress Factors  Patient Stress Factors Health changes  Family Stress Factors Health changes

## 2015-03-14 NOTE — ED Notes (Signed)
Dr. Gerlean Ren paged to [phone] @[phone] .

## 2015-03-14 NOTE — ED Notes (Signed)
Pt extremely combative after narcan administration, then becomes unconscious with HR in the 40s.

## 2015-03-15 DIAGNOSIS — T5791XS Toxic effect of unspecified inorganic substance, accidental (unintentional), sequela: Secondary | ICD-10-CM

## 2015-03-15 LAB — COMPREHENSIVE METABOLIC PANEL
ALT: 18 U/L (ref 17–63)
AST: 33 U/L (ref 15–41)
Albumin: 3 g/dL — ABNORMAL LOW (ref 3.5–5.0)
Alkaline Phosphatase: 75 U/L (ref 38–126)
Anion gap: 14 (ref 5–15)
BUN: 20 mg/dL (ref 6–20)
CO2: 24 mmol/L (ref 22–32)
Calcium: 8.4 mg/dL — ABNORMAL LOW (ref 8.9–10.3)
Chloride: 100 mmol/L — ABNORMAL LOW (ref 101–111)
Creatinine, Ser: 1.65 mg/dL — ABNORMAL HIGH (ref 0.61–1.24)
GFR calc Af Amer: 52 mL/min — ABNORMAL LOW (ref >60)
GFR calc non Af Amer: 45 mL/min — ABNORMAL LOW (ref >60)
Glucose, Bld: 238 mg/dL — ABNORMAL HIGH (ref 65–99)
Potassium: 2.8 mmol/L — ABNORMAL LOW (ref 3.5–5.1)
Sodium: 138 mmol/L (ref 135–145)
Total Bilirubin: 1.2 mg/dL (ref 0.3–1.2)
Total Protein: 6.8 g/dL (ref 6.5–8.1)

## 2015-03-15 LAB — CBC
HCT: 46.9 % (ref 39.0–52.0)
Hemoglobin: 16.3 g/dL (ref 13.0–17.0)
MCH: 30 pg (ref 26.0–34.0)
MCHC: 34.8 g/dL (ref 30.0–36.0)
MCV: 86.2 fL (ref 78.0–100.0)
Platelets: 196 10*3/uL (ref 150–400)
RBC: 5.44 MIL/uL (ref 4.22–5.81)
RDW: 14.8 % (ref 11.5–15.5)
WBC: 23.1 10*3/uL — ABNORMAL HIGH (ref 4.0–10.5)

## 2015-03-15 LAB — BASIC METABOLIC PANEL
Anion gap: 10 (ref 5–15)
BUN: 24 mg/dL — ABNORMAL HIGH (ref 6–20)
CO2: 26 mmol/L (ref 22–32)
Calcium: 8.5 mg/dL — ABNORMAL LOW (ref 8.9–10.3)
Chloride: 106 mmol/L (ref 101–111)
Creatinine, Ser: 1.26 mg/dL — ABNORMAL HIGH (ref 0.61–1.24)
GFR calc Af Amer: >60 mL/min (ref >60)
GFR calc non Af Amer: >60 mL/min (ref >60)
Glucose, Bld: 374 mg/dL — ABNORMAL HIGH (ref 65–99)
Potassium: 3.4 mmol/L — ABNORMAL LOW (ref 3.5–5.1)
Sodium: 142 mmol/L (ref 135–145)

## 2015-03-15 LAB — GLUCOSE, CAPILLARY
Glucose-Capillary: 371 mg/dL — ABNORMAL HIGH (ref 65–99)
Glucose-Capillary: 276 mg/dL — ABNORMAL HIGH (ref 65–99)

## 2015-03-15 LAB — OSMOLALITY: Osmolality: 287 mOsm/kg (ref 275–300)

## 2015-03-15 MED ORDER — MIDAZOLAM HCL 2 MG/2ML IJ SOLN: 2.0000 mg | INTRAMUSCULAR | Status: DC | PRN

## 2015-03-15 MED ORDER — SODIUM CHLORIDE 0.9 % IV SOLN
INTRAVENOUS | Status: DC
  Administered 2015-03-15: 1.7 [IU]/h via INTRAVENOUS
  Filled 2015-03-15: qty 2.5

## 2015-03-15 MED ORDER — INSULIN ASPART 100 UNIT/ML ~~LOC~~ SOLN
1.0000 [IU] | SUBCUTANEOUS | Status: DC
Start: 2015-03-15 — End: 2015-03-15

## 2015-03-15 MED ORDER — FENTANYL CITRATE (PF) 100 MCG/2ML IJ SOLN
100.0000 ug | INTRAMUSCULAR | Status: DC | PRN
  Administered 2015-03-17: 100 ug via INTRAVENOUS
  Administered 2015-03-17: 50 ug via INTRAVENOUS
  Filled 2015-03-15 (×2): qty 2

## 2015-03-15 MED ORDER — FENTANYL CITRATE (PF) 100 MCG/2ML IJ SOLN
100.0000 ug | INTRAMUSCULAR | Status: DC | PRN
  Administered 2015-03-16: 100 ug via INTRAVENOUS
  Filled 2015-03-15: qty 2

## 2015-03-15 MED ORDER — POTASSIUM CHLORIDE 10 MEQ/100ML IV SOLN
10.0000 meq | INTRAVENOUS | Status: AC
  Administered 2015-03-15 (×4): 10 meq via INTRAVENOUS
  Filled 2015-03-15 (×4): qty 100

## 2015-03-15 MED ORDER — INSULIN ASPART 100 UNIT/ML ~~LOC~~ SOLN
2.0000 [IU] | SUBCUTANEOUS | Status: DC
  Administered 2015-03-15: 6 [IU] via SUBCUTANEOUS
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
## 2015-03-15 NOTE — Progress Notes (Signed)
[facility] Progress Note Patient Name: Kirk Robinson DOB: 07/02/1956 MRN: [id number]   Date of Service  03/15/2015  HPI/Events of Note  K low   eICU Interventions  K IV supp     Intervention Category Major Interventions: Electrolyte abnormality - evaluation and management  Asencion Noble 03/15/2015, 4:47 AM

## 2015-03-15 NOTE — Progress Notes (Signed)
CBG obtained 371. Dr. Tamala Julian notified. New orders obtained. Instructed to administer 6 units insulin sq at this time. Notified of patients history of Diabetes (since March per wife). Takes Metformin 100 mg at home twice daily.
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
## 2015-03-15 NOTE — Progress Notes (Signed)
eLink Physician-Brief Progress Note Patient Name: Kirk Robinson DOB: 1956-05-18 MRN: [id number]   Date of Service  03/15/2015  HPI/Events of Note  Sudden onset hypotension and hypoxemia Pt responded to pressors, volume, peep, neb meds  eICU Interventions  Neb med, peep, volume, pressors.      Intervention Category Major Interventions: Hypotension - evaluation and management;Hypoxemia - evaluation and management  Asencion Noble 03/15/2015, 12:57 AM

## 2015-03-16 ENCOUNTER — Inpatient Hospital Stay (HOSPITAL_COMMUNITY): Payer: 59

## 2015-03-16 DIAGNOSIS — R401 Stupor: Secondary | ICD-10-CM

## 2015-03-16 DIAGNOSIS — J988 Other specified respiratory disorders: Secondary | ICD-10-CM | POA: Insufficient documentation

## 2015-03-16 DIAGNOSIS — J9600 Acute respiratory failure, unspecified whether with hypoxia or hypercapnia: Secondary | ICD-10-CM

## 2015-03-16 LAB — BASIC METABOLIC PANEL
Anion gap: 13 (ref 5–15)
Anion gap: 10 (ref 5–15)
BUN: 27 mg/dL — ABNORMAL HIGH (ref 6–20)
BUN: 26 mg/dL — ABNORMAL HIGH (ref 6–20)
CO2: 27 mmol/L (ref 22–32)
CO2: 25 mmol/L (ref 22–32)
Calcium: 8.8 mg/dL — ABNORMAL LOW (ref 8.9–10.3)
Calcium: 8.1 mg/dL — ABNORMAL LOW (ref 8.9–10.3)
Chloride: 102 mmol/L (ref 101–111)
Chloride: 106 mmol/L (ref 101–111)
Creatinine, Ser: 1.23 mg/dL (ref 0.61–1.24)
Creatinine, Ser: 1 mg/dL (ref 0.61–1.24)
GFR calc Af Amer: >60 mL/min (ref >60)
GFR calc Af Amer: >60 mL/min (ref >60)
GFR calc non Af Amer: >60 mL/min (ref >60)
GFR calc non Af Amer: >60 mL/min (ref >60)
Glucose, Bld: 203 mg/dL — ABNORMAL HIGH (ref 65–99)
Glucose, Bld: 206 mg/dL — ABNORMAL HIGH (ref 65–99)
Potassium: 3.1 mmol/L — ABNORMAL LOW (ref 3.5–5.1)
Potassium: 3.7 mmol/L (ref 3.5–5.1)
Sodium: 142 mmol/L (ref 135–145)
Sodium: 141 mmol/L (ref 135–145)

## 2015-03-16 LAB — GLUCOSE, CAPILLARY
Glucose-Capillary: 138 mg/dL — ABNORMAL HIGH (ref 65–99)
Glucose-Capillary: 150 mg/dL — ABNORMAL HIGH (ref 65–99)
Glucose-Capillary: 151 mg/dL — ABNORMAL HIGH (ref 65–99)
Glucose-Capillary: 156 mg/dL — ABNORMAL HIGH (ref 65–99)
Glucose-Capillary: 157 mg/dL — ABNORMAL HIGH (ref 65–99)
Glucose-Capillary: 168 mg/dL — ABNORMAL HIGH (ref 65–99)
Glucose-Capillary: 168 mg/dL — ABNORMAL HIGH (ref 65–99)
Glucose-Capillary: 193 mg/dL — ABNORMAL HIGH (ref 65–99)
Glucose-Capillary: 216 mg/dL — ABNORMAL HIGH (ref 65–99)
Glucose-Capillary: 229 mg/dL — ABNORMAL HIGH (ref 65–99)
Glucose-Capillary: 241 mg/dL — ABNORMAL HIGH (ref 65–99)

## 2015-03-16 LAB — TROPONIN I
Troponin I: 0.33 ng/mL — ABNORMAL HIGH (ref <0.031)
Troponin I: 0.25 ng/mL — ABNORMAL HIGH (ref <0.031)
Troponin I: 0.19 ng/mL — ABNORMAL HIGH (ref <0.031)

## 2015-03-16 LAB — HEPATIC FUNCTION PANEL
ALT: 19 U/L (ref 17–63)
AST: 34 U/L (ref 15–41)
Albumin: 3.3 g/dL — ABNORMAL LOW (ref 3.5–5.0)
Alkaline Phosphatase: 81 U/L (ref 38–126)
Bilirubin, Direct: 0.4 mg/dL (ref 0.1–0.5)
Indirect Bilirubin: 0.9 mg/dL (ref 0.3–0.9)
Total Bilirubin: 1.3 mg/dL — ABNORMAL HIGH (ref 0.3–1.2)
Total Protein: 7.5 g/dL (ref 6.5–8.1)

## 2015-03-16 LAB — CBC
HCT: 47 % (ref 39.0–52.0)
Hemoglobin: 16.7 g/dL (ref 13.0–17.0)
MCH: 30.6 pg (ref 26.0–34.0)
MCHC: 35.5 g/dL (ref 30.0–36.0)
MCV: 86.1 fL (ref 78.0–100.0)
Platelets: 214 10*3/uL (ref 150–400)
RBC: 5.46 MIL/uL (ref 4.22–5.81)
RDW: 14.9 % (ref 11.5–15.5)
WBC: 25.8 10*3/uL — ABNORMAL HIGH (ref 4.0–10.5)

## 2015-03-16 LAB — URINE CULTURE: Culture: 1000

## 2015-03-16 LAB — HEPARIN LEVEL (UNFRACTIONATED)
Heparin Unfractionated: 0.36 IU/mL (ref 0.30–0.70)
Heparin Unfractionated: 0.33 IU/mL (ref 0.30–0.70)

## 2015-03-16 MED ORDER — HEPARIN (PORCINE) IN NACL 100-0.45 UNIT/ML-% IJ SOLN
1500.0000 [IU]/h | INTRAMUSCULAR | Status: DC
  Administered 2015-03-16 – 2015-03-17 (×2): 1200 [IU]/h via INTRAVENOUS
  Administered 2015-03-17: 1350 [IU]/h via INTRAVENOUS
  Filled 2015-03-16 (×5): qty 250

## 2015-03-16 MED ORDER — INSULIN GLARGINE 100 UNIT/ML ~~LOC~~ SOLN: 5.0000 [IU] | Freq: Every day | SUBCUTANEOUS | Status: DC

## 2015-03-16 MED ORDER — DEXTROSE-NACL 5-0.9 % IV SOLN
INTRAVENOUS | Status: DC
  Administered 2015-03-16 – 2015-03-17 (×2): via INTRAVENOUS

## 2015-03-16 MED ORDER — DILTIAZEM HCL 60 MG PO TABS
60.0000 mg | ORAL_TABLET | Freq: Four times a day (QID) | ORAL | Status: DC
  Administered 2015-03-16 – 2015-03-20 (×17): 60 mg via ORAL
  Filled 2015-03-16 (×17): qty 1

## 2015-03-16 MED ORDER — DILTIAZEM HCL 100 MG IV SOLR
5.0000 mg/h | INTRAVENOUS | Status: AC
  Administered 2015-03-16: 5 mg/h via INTRAVENOUS
  Administered 2015-03-16: 15 mg/h via INTRAVENOUS
  Filled 2015-03-16 (×3): qty 100

## 2015-03-16 MED ORDER — POTASSIUM CHLORIDE 10 MEQ/100ML IV SOLN
10.0000 meq | INTRAVENOUS | Status: AC
  Administered 2015-03-16 (×6): 10 meq via INTRAVENOUS
  Filled 2015-03-16 (×6): qty 100

## 2015-03-16 MED ORDER — INSULIN ASPART 100 UNIT/ML ~~LOC~~ SOLN
0.0000 [IU] | SUBCUTANEOUS | Status: DC
  Administered 2015-03-16: 4 [IU] via SUBCUTANEOUS
  Administered 2015-03-16: 3 [IU] via SUBCUTANEOUS
  Administered 2015-03-16: 7 [IU] via SUBCUTANEOUS
  Administered 2015-03-17 (×2): 3 [IU] via SUBCUTANEOUS
  Administered 2015-03-17: 4 [IU] via SUBCUTANEOUS

## 2015-03-16 MED ORDER — INSULIN GLARGINE 100 UNIT/ML ~~LOC~~ SOLN
10.0000 [IU] | Freq: Every day | SUBCUTANEOUS | Status: DC
  Administered 2015-03-16 – 2015-03-20 (×5): 10 [IU] via SUBCUTANEOUS
  Filled 2015-03-16 (×6): qty 0.1

## 2015-03-16 MED ORDER — METOPROLOL TARTRATE 1 MG/ML IV SOLN
2.5000 mg | INTRAVENOUS | Status: DC | PRN
  Administered 2015-03-16: 5 mg via INTRAVENOUS
  Filled 2015-03-16: qty 5

## 2015-03-16 MED FILL — Medication: Qty: 1 | Status: AC

## 2015-03-16 NOTE — Progress Notes (Signed)
ANTICOAGULATION CONSULT NOTE - Follow-up  Pharmacy Consult for Heparin  Indication: atrial fibrillation  Allergies  Allergen Reactions  . Advair Diskus [Fluticasone-Salmeterol] Shortness Of Breath  . Erythromycin Nausea And Vomiting  . Iodinated Diagnostic Agents Shortness Of Breath  . Darvocet [Propoxyphene N-Acetaminophen] Nausea And Vomiting  . Doxycycline Hyclate Nausea And Vomiting, Swelling and Other (See Comments)    Tongue swelling, severe depression  . Septra [Sulfamethoxazole-Trimethoprim] Rash    Patient Measurements: Height: 5\' 10"  (177.8 cm) Weight: 152 lb 5.4 oz (69.1 kg) IBW/kg (Calculated) : 73 Vital Signs: Temp: 99.9 F (37.7 C) (07/25 0737) Temp Source: Oral (07/25 0737) BP: 122/80 mmHg (07/25 1000) Pulse Rate: 106 (07/25 1000)  Labs:  Recent Labs  03/13/15 2030  03/14/15 0442  03/15/15 0226 03/15/15 1640 03/16/15 0131 03/16/15 0631 03/16/15 0939  HGB 16.6  --  15.7  --  16.3  --  16.7  --   --   HCT 47.7  --  44.8  --  46.9  --  47.0  --   --   PLT 213  --  254  --  196  --  214  --   --   APTT 32  --   --   --   --   --   --   --   --   LABPROT 14.3  --   --   --   --   --   --   --   --   INR 1.09  --   --   --   --   --   --   --   --   HEPARINUNFRC  --   --   --   --   --   --   --   --  0.36  CREATININE 0.78  < >  --   < > 1.65* 1.26* 1.23  --   --   CKTOTAL  --   --  16*  --   --   --   --   --   --   TROPONINI  --   --   --   --   --   --  0.33* 0.25*  --   < > = values in this interval not displayed.  Estimated Creatinine Clearance: 65.5 mL/min (by C-G formula based on Cr of 1.23).  Assessment: 59 y/o M with sustained Afib/RVR started on heparin for afib. First heparin level is therapeutic. No bleeding noted.    Goal of Therapy:  Heparin level 0.3-0.7 units/ml Monitor platelets by anticoagulation protocol: Yes   Plan:  - Continue heparin gtt at 1200 units/hr - Check a 6 hour heparin level to confirm - Daily heparin level and  CBC  Salome Arnt, PharmD, BCPS Pager # [phone] 03/16/2015 10:49 AM

## 2015-03-16 NOTE — Progress Notes (Signed)
eLink Physician-Brief Progress Note Patient Name: Kirk Robinson DOB: 02/27/1956 MRN: [id number]   Date of Service  03/16/2015  HPI/Events of Note  Hypokalemia  eICU Interventions  Potassium replaced     Intervention Category Intermediate Interventions: Electrolyte abnormality - evaluation and management  DETERDING,ELIZABETH 03/16/2015, 2:35 AM

## 2015-03-16 NOTE — Progress Notes (Signed)
ANTICOAGULATION CONSULT NOTE - Follow-up  Pharmacy Consult for Heparin  Indication: atrial fibrillation  Allergies  Allergen Reactions  . Advair Diskus [Fluticasone-Salmeterol] Shortness Of Breath  . Erythromycin Nausea And Vomiting  . Iodinated Diagnostic Agents Shortness Of Breath  . Darvocet [Propoxyphene N-Acetaminophen] Nausea And Vomiting  . Doxycycline Hyclate Nausea And Vomiting, Swelling and Other (See Comments)    Tongue swelling, severe depression  . Septra [Sulfamethoxazole-Trimethoprim] Rash    Patient Measurements: Height: 5\' 10"  (177.8 cm) Weight: 152 lb 5.4 oz (69.1 kg) IBW/kg (Calculated) : 73 Vital Signs: Temp: 98.4 F (36.9 C) (07/25 1642) Temp Source: Oral (07/25 1642) BP: 93/51 mmHg (07/25 1630) Pulse Rate: 93 (07/25 1630)  Labs:  Recent Labs  03/13/15 2030  03/14/15 0442  03/15/15 0226 03/15/15 1640 03/16/15 0131 03/16/15 0631 03/16/15 0939 03/16/15 1338 03/16/15 1535  HGB 16.6  --  15.7  --  16.3  --  16.7  --   --   --   --   HCT 47.7  --  44.8  --  46.9  --  47.0  --   --   --   --   PLT 213  --  254  --  196  --  214  --   --   --   --   APTT 32  --   --   --   --   --   --   --   --   --   --   LABPROT 14.3  --   --   --   --   --   --   --   --   --   --   INR 1.09  --   --   --   --   --   --   --   --   --   --   HEPARINUNFRC  --   --   --   --   --   --   --   --  0.36  --  0.33  CREATININE 0.78  < >  --   < > 1.65* 1.26* 1.23  --   --  1.00  --   CKTOTAL  --   --  16*  --   --   --   --   --   --   --   --   TROPONINI  --   --   --   --   --   --  0.33* 0.25*  --  0.19*  --   < > = values in this interval not displayed.  Estimated Creatinine Clearance: 80.6 mL/min (by C-G formula based on Cr of 1).  Assessment: 59 y/o M with sustained Afib/RVR currently on heparin for afib. Heparin level is therapeutic at 0.33. No bleeding noted.    Goal of Therapy:  Heparin level 0.3-0.7 units/ml Monitor platelets by anticoagulation protocol:  Yes   Plan:  - Continue heparin gtt at 1200 units/hr - Daily heparin level and CBC - Monitor for s/sx of bleeding  Nicoletta Ba, PharmD, BCPS Clinical Pharmacist [phone]

## 2015-03-16 NOTE — Progress Notes (Signed)
Fentanyl 200 cc wasted witnessed by Marlow Baars, RN

## 2015-03-16 NOTE — Progress Notes (Signed)
Attempted to call wife, [name], and inform her of pending patient extubation but no answer.

## 2015-03-16 NOTE — Progress Notes (Signed)
eLink Physician-Brief Progress Note Patient Name: Kirk Robinson DOB: [birth year] MRN: [id number]   Date of Service  03/16/2015  HPI/Events of Note  Call from nurse reporting AF new onset tonight - rate currently in the 120s.  HD stable on no pressors.  No know PMH of AF.  eICU Interventions  Plan: PRN lopressor for HR control as needed. Cycle trop Hold on anticoagulation for now     Intervention Category Intermediate Interventions: Arrhythmia - evaluation and management  DETERDING,ELIZABETH 03/16/2015, 1:09 AM

## 2015-03-16 NOTE — Progress Notes (Signed)
Insulin gtt stopped per MD

## 2015-03-16 NOTE — Progress Notes (Addendum)
Pt's HR increased to 160's. EKG showed A.Fib wit RVR. Informed Dr. Jimmy Footman new orders given and carried out. MD also aware of urine output.

## 2015-03-16 NOTE — Progress Notes (Signed)
eLink Physician-Brief Progress Note Patient Name: Kirk Robinson DOB: 1955-08-26 MRN: [id number]   Date of Service  03/16/2015  HPI/Events of Note  Now with sustained AF/RVR  eICU Interventions  Dilt infusion initiated Heparin gtt     Intervention Category Intermediate Interventions: Arrhythmia - evaluation and management  [name] 03/16/2015, 1:14 AM

## 2015-03-16 NOTE — Procedures (Signed)
Extubation Procedure Note  Patient Details:   Name: Kirk Robinson DOB: [birth year] MRN: [id number]   Airway Documentation:     Evaluation  O2 sats: stable throughout Complications: No apparent complications Patient did tolerate procedure well. Bilateral Breath Sounds: Diminished, Rhonchi Suctioning: Airway, Oral No   Vitals are WNL.  Beatris Si D 03/16/2015, 3:31 PM

## 2015-03-16 NOTE — Progress Notes (Signed)
ANTICOAGULATION CONSULT NOTE - Initial Consult  Pharmacy Consult for Heparin  Indication: atrial fibrillation  Allergies  Allergen Reactions  . Advair Diskus [Fluticasone-Salmeterol] Shortness Of Breath  . Erythromycin Nausea And Vomiting  . Iodinated Diagnostic Agents Shortness Of Breath  . Darvocet [Propoxyphene N-Acetaminophen] Nausea And Vomiting  . Doxycycline Hyclate Nausea And Vomiting, Swelling and Other (See Comments)    Tongue swelling, severe depression  . Septra [Sulfamethoxazole-Trimethoprim] Rash    Patient Measurements: Height: 5\' 10"  (177.8 cm) Weight: 158 lb 15.2 oz (72.1 kg) IBW/kg (Calculated) : 73 Vital Signs: Temp: 99 F (37.2 C) (07/25 0021) Temp Source: Oral (07/25 0021) BP: 117/84 mmHg (07/25 0100) Pulse Rate: 128 (07/25 0100)  Labs:  Recent Labs  03/13/15 2030  03/14/15 0442 03/14/15 1545 03/15/15 0226 03/15/15 1640  HGB 16.6  --  15.7  --  16.3  --   HCT 47.7  --  44.8  --  46.9  --   PLT 213  --  254  --  196  --   APTT 32  --   --   --   --   --   LABPROT 14.3  --   --   --   --   --   INR 1.09  --   --   --   --   --   CREATININE 0.78  < >  --  0.91 1.65* 1.26*  CKTOTAL  --   --  16*  --   --   --   < > = values in this interval not displayed.  Estimated Creatinine Clearance: 66.8 mL/min (by C-G formula based on Cr of 1.26).   Medical History: Past Medical History  Diagnosis Date  . Altered mental status   . Memory loss   . Osteoporosis, unspecified     bone density per [facility] in 2012  . Iodine hypothyroidism   . Anxiety state, unspecified   . Pure hypercholesterolemia   . Hematuria 03/16/2007  . Allergic rhinitis, cause unspecified   . Alcohol use 03/16/2007    patient risk factors  . Esophageal reflux   . Tobacco use disorder 03/16/2007    patient risk factors  . 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 Cervicalgia 09/17/2007  . Diplopia 12/21/2007  . Other abnormal glucose 04/08/2008  .  Personal history of colonic polyps   . Other diseases of lung, not elsewhere classified 01/23/2009    s/p Fleming/pulmonology consult; intolerant  to Advair  . Gastritis 05/13/2009  . Peripheral vascular disease, unspecified 12/24/2009  . Unspecified hypothyroidism 01/07/2010  . Hyperthyroidism     graves disease  . COPD (chronic obstructive pulmonary disease)   . Shortness of breath   . Stroke 2005  . Pneumonia, organism unspecified   . NSIP (nonspecific interstitial pneumonia)     on azathioprine  . HCAP (healthcare-associated pneumonia) 08/12/2014     Assessment: 59 y/o M with sustained Afib/RVR to start heparin per pharmacy. CBC good. Mild bump in Scr. Will avoid bolus with recent subcutaneous heparin administration.   Goal of Therapy:  Heparin level 0.3-0.7 units/ml Monitor platelets by anticoagulation protocol: Yes   Plan:  -DC subcutaneous heparin  -Start heparin drip at 1200 units/hr -1000 HL -Daily CBC/HL -Monitor for bleeding  Narda Bonds 03/16/2015,1:27 AM

## 2015-03-17 ENCOUNTER — Inpatient Hospital Stay (HOSPITAL_COMMUNITY): Payer: 59

## 2015-03-17 DIAGNOSIS — I480 Paroxysmal atrial fibrillation: Secondary | ICD-10-CM

## 2015-03-17 DIAGNOSIS — E876 Hypokalemia: Secondary | ICD-10-CM

## 2015-03-17 LAB — GLUCOSE, CAPILLARY
Glucose-Capillary: 165 mg/dL — ABNORMAL HIGH (ref 65–99)
Glucose-Capillary: 199 mg/dL — ABNORMAL HIGH (ref 65–99)
Glucose-Capillary: 128 mg/dL — ABNORMAL HIGH (ref 65–99)
Glucose-Capillary: 143 mg/dL — ABNORMAL HIGH (ref 65–99)
Glucose-Capillary: 116 mg/dL — ABNORMAL HIGH (ref 65–99)
Glucose-Capillary: 120 mg/dL — ABNORMAL HIGH (ref 65–99)
Glucose-Capillary: 111 mg/dL — ABNORMAL HIGH (ref 65–99)

## 2015-03-17 LAB — CBC
HCT: 40.2 % (ref 39.0–52.0)
Hemoglobin: 13.7 g/dL (ref 13.0–17.0)
MCH: 29.8 pg (ref 26.0–34.0)
MCHC: 34.1 g/dL (ref 30.0–36.0)
MCV: 87.4 fL (ref 78.0–100.0)
Platelets: 170 10*3/uL (ref 150–400)
RBC: 4.6 MIL/uL (ref 4.22–5.81)
RDW: 15 % (ref 11.5–15.5)
WBC: 18.1 10*3/uL — ABNORMAL HIGH (ref 4.0–10.5)

## 2015-03-17 LAB — BASIC METABOLIC PANEL
Anion gap: 11 (ref 5–15)
BUN: 18 mg/dL (ref 6–20)
CO2: 26 mmol/L (ref 22–32)
Calcium: 8.1 mg/dL — ABNORMAL LOW (ref 8.9–10.3)
Chloride: 108 mmol/L (ref 101–111)
Creatinine, Ser: 0.8 mg/dL (ref 0.61–1.24)
GFR calc Af Amer: >60 mL/min (ref >60)
GFR calc non Af Amer: >60 mL/min (ref >60)
Glucose, Bld: 144 mg/dL — ABNORMAL HIGH (ref 65–99)
Potassium: 3.1 mmol/L — ABNORMAL LOW (ref 3.5–5.1)
Sodium: 145 mmol/L (ref 135–145)

## 2015-03-17 LAB — HEPARIN LEVEL (UNFRACTIONATED)
Heparin Unfractionated: 0.26 IU/mL — ABNORMAL LOW (ref 0.30–0.70)
Heparin Unfractionated: 0.39 IU/mL (ref 0.30–0.70)
Heparin Unfractionated: 0.32 IU/mL (ref 0.30–0.70)

## 2015-03-17 MED ORDER — INSULIN ASPART 100 UNIT/ML ~~LOC~~ SOLN: 0.0000 [IU] | Freq: Every day | SUBCUTANEOUS | Status: DC

## 2015-03-17 MED ORDER — POTASSIUM CHLORIDE 20 MEQ/15ML (10%) PO SOLN
30.0000 meq | ORAL | Status: AC
  Administered 2015-03-17 (×2): 30 meq
  Filled 2015-03-17 (×2): qty 30

## 2015-03-17 MED ORDER — ALBUTEROL SULFATE (2.5 MG/3ML) 0.083% IN NEBU: 2.5000 mg | INHALATION_SOLUTION | RESPIRATORY_TRACT | Status: DC | PRN

## 2015-03-17 MED ORDER — TIOTROPIUM BROMIDE MONOHYDRATE 18 MCG IN CAPS
18.0000 ug | ORAL_CAPSULE | Freq: Every day | RESPIRATORY_TRACT | Status: DC
  Administered 2015-03-18 – 2015-03-20 (×3): 18 ug via RESPIRATORY_TRACT
  Filled 2015-03-17: qty 5

## 2015-03-17 MED ORDER — CEFTRIAXONE SODIUM IN DEXTROSE 20 MG/ML IV SOLN
1.0000 g | INTRAVENOUS | Status: DC
  Administered 2015-03-17: 1 g via INTRAVENOUS
  Filled 2015-03-17 (×2): qty 50

## 2015-03-17 MED ORDER — SERTRALINE HCL 100 MG PO TABS
200.0000 mg | ORAL_TABLET | Freq: Every day | ORAL | Status: DC
  Administered 2015-03-17 – 2015-03-19 (×3): 200 mg via ORAL
  Filled 2015-03-17 (×3): qty 2

## 2015-03-17 MED ORDER — DEXTROSE-NACL 5-0.9 % IV SOLN
INTRAVENOUS | Status: DC
  Administered 2015-03-18: 15:00:00 via INTRAVENOUS

## 2015-03-17 MED ORDER — INSULIN ASPART 100 UNIT/ML ~~LOC~~ SOLN
0.0000 [IU] | Freq: Three times a day (TID) | SUBCUTANEOUS | Status: DC
Start: 2015-03-17 — End: 2015-03-20
  Administered 2015-03-18 – 2015-03-20 (×2): 3 [IU] via SUBCUTANEOUS
  Administered 2015-03-20: 7 [IU] via SUBCUTANEOUS

## 2015-03-17 MED ORDER — THYROID 60 MG PO TABS
90.0000 mg | ORAL_TABLET | Freq: Every day | ORAL | Status: DC
Start: 2015-03-17 — End: 2015-03-20
  Administered 2015-03-17 – 2015-03-20 (×4): 90 mg via ORAL
  Filled 2015-03-17 (×4): qty 1

## 2015-03-17 MED ORDER — MORPHINE SULFATE 15 MG PO TABS
15.0000 mg | ORAL_TABLET | ORAL | Status: DC | PRN
  Administered 2015-03-17 – 2015-03-18 (×2): 7.5 mg via ORAL
  Administered 2015-03-18 – 2015-03-20 (×13): 15 mg via ORAL
  Filled 2015-03-17 (×16): qty 1

## 2015-03-17 NOTE — Progress Notes (Signed)
[facility] ADULT ICU REPLACEMENT PROTOCOL FOR AM LAB REPLACEMENT ONLY  The patient does apply for the [facility] Adult ICU Electrolyte Replacment Protocol based on the criteria listed below:   1. Is GFR >/= 40 ml/min? Yes.    Patient's GFR today is >60 2. Is urine output >/= 0.5 ml/kg/hr for the last 6 hours? Yes.   Patient's UOP is 1 ml/kg/hr 3. Is BUN < 60 mg/dL? Yes.    Patient's BUN today is 18 4. Abnormal electrolyte(s):K 3.1 5. Ordered repletion with: per protocol 6. If a panic level lab has been reported, has the CCM MD in charge been notified? No..   Physician:    Ronda Fairly A 03/17/2015 3:58 AM

## 2015-03-17 NOTE — Evaluation (Signed)
Clinical/Bedside Swallow Evaluation Patient Details  Name: Kirk Robinson MRN: [id number] Date of Birth: September 18, 1955  Today's Date: 03/17/2015 Time: SLP Start Time (ACUTE ONLY): 1335 SLP Stop Time (ACUTE ONLY): 1348 SLP Time Calculation (min) (ACUTE ONLY): 13 min  Past Medical History:  Past Medical History  Diagnosis Date  . Altered mental status   . Memory loss   . Osteoporosis, unspecified     bone density per [facility] in 2012  . Iodine hypothyroidism   . Anxiety state, unspecified   . Pure hypercholesterolemia   . Hematuria 03/16/2007  . Allergic rhinitis, cause unspecified   . Alcohol use 03/16/2007    patient risk factors  . Esophageal reflux   . Tobacco use disorder 03/16/2007    patient risk factors  . 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 Cervicalgia 09/17/2007  . Diplopia 12/21/2007  . Other abnormal glucose 04/08/2008  . Personal history of colonic polyps   . Other diseases of lung, not elsewhere classified 01/23/2009    s/p Fleming/pulmonology consult; intolerant  to Advair  . Gastritis 05/13/2009  . Peripheral vascular disease, unspecified 12/24/2009  . Unspecified hypothyroidism 01/07/2010  . Hyperthyroidism     graves disease  . COPD (chronic obstructive pulmonary disease)   . Shortness of breath   . Stroke 2005  . Pneumonia, organism unspecified   . NSIP (nonspecific interstitial pneumonia)     on azathioprine  . HCAP (healthcare-associated pneumonia) 08/12/2014   Past Surgical History:  Past Surgical History  Procedure Laterality Date  . Eye surgery  2010  . 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 Admission 11/2011      altered mental status/confusion with syncope.  CT head negative, MRI brain negative, EEG: diffuse slowing but no  seizure acitivity.  Labs negative.  [facility].  [facility]  04/2012    poor short-term memory, poor recall, delayed reaction time; permanently disabled.    .  Laparoscopic revision ventricular-peritoneal (v-p) shunt Right 05/20/2014    Procedure: LAPAROSCOPIC REVISION VENTRICULAR-PERITONEAL (V-P) SHUNT;  Surgeon: Georganna Skeans, MD;  Location: [facility];  Service: General;  Laterality: Right;   HPI:  [name] is a 59 yo male presenting today with AMS after being found by family lethargic and confused. Found to have taken multiple medications. Intubated from 7/22 to 7/25. 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 Has been seen by SLP in the past, recommended to consume regular diet and thin liquids.    Assessment / Plan / Recommendation Clinical Impression  Pt demonstrates mild oral dysphagia with anterior spillage, though suspect this is due to interference of NG tube near the lips and generalized weakness from the pt. No signs of aspiration observed and no sign of residuals. Given no history of dysphagia and clear vocal quality, recommend pt advance to regular diet and thin liquids when NG tube is removed. SLP will f/u x1 to check for tolerance given mild oral deficits today.     Aspiration Risk  Mild    Diet Recommendation Age appropriate regular solids;Thin   Medication Administration: Whole meds with liquid Compensations: Slow rate;Small sips/bites    Other  Recommendations Oral Care Recommendations: Oral care BID   Follow Up Recommendations       Frequency and Duration min 1 x/week  1 week   Pertinent Vitals/Pain NA    SLP Swallow Goals     Swallow Study Prior Functional Status  General Other Pertinent Information: [name] is a 59 yo male presenting today with AMS after being found by family lethargic and confused. Found to have taken multiple medications. Intubated from 7/22 to 7/25. 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 Has been seen by SLP in the past, recommended to consume regular diet and  thin liquids.  Type of Study: Bedside swallow evaluation Diet Prior to this Study: NPO Respiratory Status: Supplemental O2 delivered via (comment) History of Recent Intubation: Yes Length of Intubations (days): 3 days Date extubated: 03/16/15 Behavior/Cognition: Alert;Cooperative;Requires cueing Oral Cavity - Dentition: Adequate natural dentition/normal for age Self-Feeding Abilities: Able to feed self Patient Positioning: Upright in bed Baseline Vocal Quality: Normal Volitional Cough: Strong Volitional Swallow: Able to elicit    Oral/Motor/Sensory Function Overall Oral Motor/Sensory Function: Appears within functional limits for tasks assessed   Ice Chips     Thin Liquid Thin Liquid: Impaired Presentation: Cup Oral Phase Impairments: Reduced labial seal Oral Phase Functional Implications: Right anterior spillage;Left anterior spillage    Nectar Thick Nectar Thick Liquid: Not tested   Honey Thick Honey Thick Liquid: Not tested   Puree Puree: Within functional limits   Solid   GO    Solid: Within functional limits      [facility], [place] CCC-SLP [phone]  Lynann Beaver 03/17/2015,2:08 PM
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
## 2015-03-17 NOTE — Progress Notes (Signed)
ANTICOAGULATION CONSULT NOTE - Follow-up  Pharmacy Consult for Heparin  Indication: atrial fibrillation  Allergies  Allergen Reactions  . Advair Diskus [Fluticasone-Salmeterol] Shortness Of Breath  . Erythromycin Nausea And Vomiting  . Iodinated Diagnostic Agents Shortness Of Breath  . Darvocet [Propoxyphene N-Acetaminophen] Nausea And Vomiting  . Doxycycline Hyclate Nausea And Vomiting, Swelling and Other (See Comments)    Tongue swelling, severe depression  . Septra [Sulfamethoxazole-Trimethoprim] Rash    Patient Measurements: Height: 5\' 10"  (177.8 cm) Weight: 151 lb 7.3 oz (68.7 kg) IBW/kg (Calculated) : 73 Vital Signs: Temp: 98.3 F (36.8 C) (07/26 1653) Temp Source: Axillary (07/26 1653) BP: 114/79 mmHg (07/26 1653) Pulse Rate: 76 (07/26 1653)  Labs:  Recent Labs  03/15/15 0226  03/16/15 0131 03/16/15 0631  03/16/15 1338  03/17/15 0232 03/17/15 1110 03/17/15 1620  HGB 16.3  --  16.7  --   --   --   --  13.7  --   --   HCT 46.9  --  47.0  --   --   --   --  40.2  --   --   PLT 196  --  214  --   --   --   --  170  --   --   HEPARINUNFRC  --   --   --   --   < >  --   < > 0.26* 0.39 0.32  CREATININE 1.65*  < > 1.23  --   --  1.00  --  0.80  --   --   TROPONINI  --   --  0.33* 0.25*  --  0.19*  --   --   --   --   < > = values in this interval not displayed.  Estimated Creatinine Clearance: 100.2 mL/min (by C-G formula based on Cr of 0.8).  Assessment: 76 yom continues on IV heparin for afib. Heparin level remains therapeutic but has trended down.  Will increase gtt slightly. No bleeding noted.   Noted Dr. Evette Georges assessment for no systemic anticoagulation.  Paged him to clarify, continue IV heparin.  No oral anticoagulation at this time.  Goal of Therapy:  Heparin level 0.3-0.7 units/ml Monitor platelets by anticoagulation protocol: Yes   Plan:  - Increase heparin gtt to 1400 units/hr - Daily CBC/HL - Monitor for bleeding  [employer], Pharm.D.,  BCPS Clinical Pharmacist Pager [phone] 03/17/2015 6:40 PM

## 2015-03-17 NOTE — Progress Notes (Signed)
eLink Physician-Brief Progress Note Patient Name: Kirk Robinson DOB: Jan 22, 1956 MRN: [id number]   Date of Service  03/17/2015  HPI/Events of Note  Speech recommends advancing diet.  eICU Interventions  Will order: 1. D/C NPO. 2. Carbohydrate modified diet with thin liquids. 3. Decrease D5W IV fluid to 25 mL/hour until he eats. 4. Change resistant Novolog SSI to [facility] and HS.     Intervention Category Major Interventions: Hyperglycemia - active titration of insulin therapy Minor Interventions: Routine modifications to care plan (e.g. PRN medications for pain, fever)  Shashana Fullington Eugene 03/17/2015, 3:19 PM

## 2015-03-17 NOTE — Progress Notes (Signed)
ANTICOAGULATION CONSULT NOTE - Initial Consult  Pharmacy Consult for Heparin  Indication: atrial fibrillation  Allergies  Allergen Reactions  . Advair Diskus [Fluticasone-Salmeterol] Shortness Of Breath  . Erythromycin Nausea And Vomiting  . Iodinated Diagnostic Agents Shortness Of Breath  . Darvocet [Propoxyphene N-Acetaminophen] Nausea And Vomiting  . Doxycycline Hyclate Nausea And Vomiting, Swelling and Other (See Comments)    Tongue swelling, severe depression  . Septra [Sulfamethoxazole-Trimethoprim] Rash    Patient Measurements: Height: 5\' 10"  (177.8 cm) Weight: 152 lb 5.4 oz (69.1 kg) IBW/kg (Calculated) : 73 Vital Signs: Temp: 97.5 F (36.4 C) (07/26 0117) Temp Source: Oral (07/26 0117) BP: 112/62 mmHg (07/26 0317) Pulse Rate: 87 (07/26 0317)  Labs:  Recent Labs  03/14/15 0442  03/15/15 0226  03/16/15 0131 03/16/15 0631 03/16/15 0939 03/16/15 1338 03/16/15 1535 03/17/15 0232  HGB 15.7  --  16.3  --  16.7  --   --   --   --  13.7  HCT 44.8  --  46.9  --  47.0  --   --   --   --  40.2  PLT 254  --  196  --  214  --   --   --   --  170  HEPARINUNFRC  --   --   --   --   --   --  0.36  --  0.33 0.26*  CREATININE  --   < > 1.65*  < > 1.23  --   --  1.00  --  0.80  CKTOTAL 16*  --   --   --   --   --   --   --   --   --   TROPONINI  --   --   --   --  0.33* 0.25*  --  0.19*  --   --   < > = values in this interval not displayed.  Estimated Creatinine Clearance: 100.8 mL/min (by C-G formula based on Cr of 0.8).   Medical History: Past Medical History  Diagnosis Date  . Altered mental status   . Memory loss   . Osteoporosis, unspecified     bone density per Morivati in 2012  . Iodine hypothyroidism   . Anxiety state, unspecified   . Pure hypercholesterolemia   . Hematuria 03/16/2007  . Allergic rhinitis, cause unspecified   . Alcohol use 03/16/2007    patient risk factors  . Esophageal reflux   . Tobacco use disorder 03/16/2007    patient risk  factors  . 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 Cervicalgia 09/17/2007  . Diplopia 12/21/2007  . Other abnormal glucose 04/08/2008  . Personal history of colonic polyps   . Other diseases of lung, not elsewhere classified 01/23/2009    s/p Fleming/pulmonology consult; intolerant  to Advair  . Gastritis 05/13/2009  . Peripheral vascular disease, unspecified 12/24/2009  . Unspecified hypothyroidism 01/07/2010  . Hyperthyroidism     graves disease  . COPD (chronic obstructive pulmonary disease)   . Shortness of breath   . Stroke 2005  . Pneumonia, organism unspecified   . NSIP (nonspecific interstitial pneumonia)     on azathioprine  . HCAP (healthcare-associated pneumonia) 08/12/2014     Assessment: Sub-therapeutic heparin level, no issues per RN.   Goal of Therapy:  Heparin level 0.3-0.7 units/ml Monitor platelets by anticoagulation protocol: Yes   Plan:  -Increase heparin drip to 1350 units/hr -1100 HL -Daily CBC/HL -Monitor for bleeding  Narda Bonds 03/17/2015,3:30 AM
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
## 2015-03-17 NOTE — Progress Notes (Signed)
ANTICOAGULATION CONSULT NOTE - Follow-up  Pharmacy Consult for Heparin  Indication: atrial fibrillation  Allergies  Allergen Reactions  . Advair Diskus [Fluticasone-Salmeterol] Shortness Of Breath  . Erythromycin Nausea And Vomiting  . Iodinated Diagnostic Agents Shortness Of Breath  . Darvocet [Propoxyphene N-Acetaminophen] Nausea And Vomiting  . Doxycycline Hyclate Nausea And Vomiting, Swelling and Other (See Comments)    Tongue swelling, severe depression  . Septra [Sulfamethoxazole-Trimethoprim] Rash    Patient Measurements: Height: 5\' 10"  (177.8 cm) Weight: 151 lb 7.3 oz (68.7 kg) IBW/kg (Calculated) : 73 Vital Signs: Temp: 99.4 F (37.4 C) (07/26 1203) Temp Source: Oral (07/26 1203) BP: 120/67 mmHg (07/26 1100) Pulse Rate: 92 (07/26 1100)  Labs:  Recent Labs  03/15/15 0226  03/16/15 0131 03/16/15 0631  03/16/15 1338 03/16/15 1535 03/17/15 0232 03/17/15 1110  HGB 16.3  --  16.7  --   --   --   --  13.7  --   HCT 46.9  --  47.0  --   --   --   --  40.2  --   PLT 196  --  214  --   --   --   --  170  --   HEPARINUNFRC  --   --   --   --   < >  --  0.33 0.26* 0.39  CREATININE 1.65*  < > 1.23  --   --  1.00  --  0.80  --   TROPONINI  --   --  0.33* 0.25*  --  0.19*  --   --   --   < > = values in this interval not displayed.  Estimated Creatinine Clearance: 100.2 mL/min (by C-G formula based on Cr of 0.8).  Assessment: 68 yom continues on IV heparin for afib. Heparin level is now therapeutic. No bleeding noted.   Goal of Therapy:  Heparin level 0.3-0.7 units/ml Monitor platelets by anticoagulation protocol: Yes   Plan:  - Continue heparin gtt at 1350 units/hr - Check a 6 hour heparin level to confirm dosing - Daily CBC/HL - Monitor for bleeding  Merrell Borsuk, Rande Lawman 03/17/2015,12:03 PM

## 2015-03-18 ENCOUNTER — Inpatient Hospital Stay (HOSPITAL_COMMUNITY): Payer: 59

## 2015-03-18 DIAGNOSIS — J988 Other specified respiratory disorders: Secondary | ICD-10-CM

## 2015-03-18 DIAGNOSIS — I4891 Unspecified atrial fibrillation: Secondary | ICD-10-CM

## 2015-03-18 DIAGNOSIS — I482 Chronic atrial fibrillation, unspecified: Secondary | ICD-10-CM | POA: Insufficient documentation

## 2015-03-18 DIAGNOSIS — G9340 Encephalopathy, unspecified: Secondary | ICD-10-CM

## 2015-03-18 LAB — BASIC METABOLIC PANEL
Anion gap: 7 (ref 5–15)
BUN: 15 mg/dL (ref 6–20)
CO2: 22 mmol/L (ref 22–32)
Calcium: 8 mg/dL — ABNORMAL LOW (ref 8.9–10.3)
Chloride: 111 mmol/L (ref 101–111)
Creatinine, Ser: 0.71 mg/dL (ref 0.61–1.24)
GFR calc Af Amer: >60 mL/min (ref >60)
GFR calc non Af Amer: >60 mL/min (ref >60)
Glucose, Bld: 92 mg/dL (ref 65–99)
Potassium: 4 mmol/L (ref 3.5–5.1)
Sodium: 140 mmol/L (ref 135–145)

## 2015-03-18 LAB — CBC
HCT: 39 % (ref 39.0–52.0)
Hemoglobin: 12.8 g/dL — ABNORMAL LOW (ref 13.0–17.0)
MCH: 29.2 pg (ref 26.0–34.0)
MCHC: 32.8 g/dL (ref 30.0–36.0)
MCV: 89 fL (ref 78.0–100.0)
Platelets: 174 10*3/uL (ref 150–400)
RBC: 4.38 MIL/uL (ref 4.22–5.81)
RDW: 14.8 % (ref 11.5–15.5)
WBC: 9.3 10*3/uL (ref 4.0–10.5)

## 2015-03-18 LAB — HEPARIN LEVEL (UNFRACTIONATED): Heparin Unfractionated: 0.2 IU/mL — ABNORMAL LOW (ref 0.30–0.70)

## 2015-03-18 LAB — CULTURE, RESPIRATORY W GRAM STAIN

## 2015-03-18 LAB — GLUCOSE, CAPILLARY
Glucose-Capillary: 82 mg/dL (ref 65–99)
Glucose-Capillary: 109 mg/dL — ABNORMAL HIGH (ref 65–99)
Glucose-Capillary: 121 mg/dL — ABNORMAL HIGH (ref 65–99)
Glucose-Capillary: 121 mg/dL — ABNORMAL HIGH (ref 65–99)

## 2015-03-18 LAB — CULTURE, RESPIRATORY

## 2015-03-18 MED ORDER — PANTOPRAZOLE SODIUM 40 MG PO TBEC
40.0000 mg | DELAYED_RELEASE_TABLET | Freq: Every day | ORAL | Status: DC
  Administered 2015-03-18 – 2015-03-20 (×3): 40 mg via ORAL
  Filled 2015-03-18 (×3): qty 1

## 2015-03-18 MED ORDER — CEFTRIAXONE SODIUM IN DEXTROSE 20 MG/ML IV SOLN
1.0000 g | INTRAVENOUS | Status: DC
  Administered 2015-03-18 – 2015-03-20 (×3): 1 g via INTRAVENOUS
  Filled 2015-03-18 (×3): qty 50

## 2015-03-18 MED ORDER — PREDNISONE 5 MG PO TABS
15.0000 mg | ORAL_TABLET | Freq: Every day | ORAL | Status: DC
  Administered 2015-03-18 – 2015-03-20 (×3): 15 mg via ORAL
  Filled 2015-03-18 (×5): qty 1

## 2015-03-18 NOTE — Progress Notes (Signed)
Spoke with Dr. Cruzita Lederer reference to patient medications.  Pt on predisone at home, predisone ordered.

## 2015-03-18 NOTE — Progress Notes (Signed)
Speech Language Pathology Treatment: Dysphagia  Patient Details Name: Kirk Robinson MRN: [id number] DOB: 1956/03/16 Today's Date: 03/18/2015 Time: 3614-4315 SLP Time Calculation (min) (ACUTE ONLY): 8 min  Assessment / Plan / Recommendation Clinical Impression  Goal of session was toleration and safety with recommended po's. Per sister (?) pt is less confused than yesterday, able to feed self. Consistent, mild throat clear following thin via straw with verbal clinical reasoning provided for smaller straw sips provided. Pt afebrile and lungs unchanged per RN documentation. Continue regular texture and thin liquids, goals met and discharged from [facility].   HPI Other Pertinent Information: Kirk Robinson is a 59 yo male presenting today with AMS after being found by family lethargic and confused. Found to have taken multiple medications. Intubated from 7/22 to 7/25. 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 Has been seen by SLP in the past, recommended to consume regular diet and thin liquids.    Pertinent Vitals Pain Assessment: No/denies pain  SLP Plan  All goals met;Discharge SLP treatment due to (comment)    Recommendations Diet recommendations: Regular;Thin liquid Liquids provided via: Cup;Straw Medication Administration: Whole meds with liquid Supervision: Patient able to self feed;Intermittent supervision to cue for compensatory strategies Compensations: Slow rate;Small sips/bites Postural Changes and/or Swallow Maneuvers: Seated upright 90 degrees              Oral Care Recommendations: Oral care BID Follow up Recommendations: None Plan: All goals met;Discharge SLP treatment due to (comment)    GO     Houston Siren 03/18/2015, 10:42 AM   Orbie Pyo Colvin Caroli.Ed [employer] [phone]

## 2015-03-18 NOTE — Progress Notes (Signed)
[facility] for Heparin  Indication: atrial fibrillation  Allergies  Allergen Reactions  . Advair Diskus [Fluticasone-Salmeterol] Shortness Of Breath  . Erythromycin Nausea And Vomiting  . Iodinated Diagnostic Agents Shortness Of Breath  . Darvocet [Propoxyphene N-Acetaminophen] Nausea And Vomiting  . Doxycycline Hyclate Nausea And Vomiting, Swelling and Other (See Comments)    Tongue swelling, severe depression  . Septra [Sulfamethoxazole-Trimethoprim] Rash    Patient Measurements: Height: 5\' 10"  (177.8 cm) Weight: 151 lb 10.8 oz (68.8 kg) IBW/kg (Calculated) : 73 Vital Signs: Temp: 98.2 F (36.8 C) (07/27 0515) Temp Source: Oral (07/27 0515) BP: 110/73 mmHg (07/27 0515) Pulse Rate: 80 (07/27 0515)  Labs:  Recent Labs  03/16/15 0131 03/16/15 0631  03/16/15 1338  03/17/15 0232 03/17/15 1110 03/17/15 1620 03/18/15 0520  HGB 16.7  --   --   --   --  13.7  --   --  12.8*  HCT 47.0  --   --   --   --  40.2  --   --  39.0  PLT 214  --   --   --   --  170  --   --  174  HEPARINUNFRC  --   --   < >  --   < > 0.26* 0.39 0.32 0.20*  CREATININE 1.23  --   --  1.00  --  0.80  --   --   --   TROPONINI 0.33* 0.25*  --  0.19*  --   --   --   --   --   < > = values in this interval not displayed.  Estimated Creatinine Clearance: 100.3 mL/min (by C-G formula based on Cr of 0.8).  Assessment: 59 y.o. male with Afib for heparin  Goal of Therapy:  Heparin level 0.3-0.7 units/ml Monitor platelets by anticoagulation protocol: Yes   Plan:  Increase Heparin 1500 units/hr Check heparin level in 6 hours.  Phillis Knack, PharmD, BCPS  03/18/2015 7:09 AM

## 2015-03-18 NOTE — Care Management Important Message (Signed)
Important Message  Patient Details  Name: Kirk Robinson MRN: [id number] Date of Birth: Oct 31, 1955   Medicare Important Message Given:  Yes-second notification given    Delorse Lek 03/18/2015, 12:46 PM

## 2015-03-18 NOTE — Progress Notes (Signed)
Pt bp 101/72 hr 80 NSR.  Paged Dr. Franki Monte bp running 90's and low 100's, pt to get 60 mg po cardizem, does he want to hold?
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
## 2015-03-19 DIAGNOSIS — E440 Moderate protein-calorie malnutrition: Secondary | ICD-10-CM | POA: Insufficient documentation

## 2015-03-19 DIAGNOSIS — T5791XD Toxic effect of unspecified inorganic substance, accidental (unintentional), subsequent encounter: Secondary | ICD-10-CM

## 2015-03-19 LAB — CBC
HCT: 36.4 % — ABNORMAL LOW (ref 39.0–52.0)
Hemoglobin: 12.3 g/dL — ABNORMAL LOW (ref 13.0–17.0)
MCH: 29.5 pg (ref 26.0–34.0)
MCHC: 33.8 g/dL (ref 30.0–36.0)
MCV: 87.3 fL (ref 78.0–100.0)
Platelets: 193 10*3/uL (ref 150–400)
RBC: 4.17 MIL/uL — ABNORMAL LOW (ref 4.22–5.81)
RDW: 14 % (ref 11.5–15.5)
WBC: 7.2 10*3/uL (ref 4.0–10.5)

## 2015-03-19 LAB — GLUCOSE, CAPILLARY
Glucose-Capillary: 91 mg/dL (ref 65–99)
Glucose-Capillary: 111 mg/dL — ABNORMAL HIGH (ref 65–99)
Glucose-Capillary: 109 mg/dL — ABNORMAL HIGH (ref 65–99)
Glucose-Capillary: 126 mg/dL — ABNORMAL HIGH (ref 65–99)
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
## 2015-03-19 NOTE — Progress Notes (Signed)
Nutrition Follow-up  DOCUMENTATION CODES:   Non-severe (moderate) malnutrition in context of chronic illness   Pt meets criteria for MODERATE MALNUTRITION in the context of chronic illness as evidenced by a 17% weight loss in 4 months, mild to moderate fat and muscle mass loss.  INTERVENTION:   Provide nourishment snacks. (Ordered).  NUTRITION DIAGNOSIS:   Increased nutrient needs related to chronic illness as evidenced by estimated needs  GOAL:   Patient will meet greater than or equal to 90% of their needs; progressing  MONITOR:   PO intake, Weight trends, Labs, I & O's  REASON FOR ASSESSMENT:   Ventilator    ASSESSMENT:   Patient admitted on 7/22 with AMS related to ingestion of opiods, ? overdose of thyroid medicine. Extubated 7/25.  Pt reports appetite is fine currently and PTA at home. Current meal completion is 100%. Pt does endorse weight loss. Per Epic weight records, pt with a 17% weight loss in 4 months. Pt reports weight loss started initially with his diagnosis of diabetes. Pt was offered nutritional supplements, however pt refused. Pt is agreeable to nourishment snacks. RD to order. Pt was encouraged to eat her food at meals.  Nutrition-Focused physical exam completed. Findings are mild to moderate fat depletion, mild to moderate muscle depletion, and mild edema.   Diet Order:  Diet Carb Modified Fluid consistency:: Thin; Room service appropriate?: Yes  Skin:   (+1 UE edema)  Last BM:  7/27  Height:   Ht Readings from Last 1 Encounters:  03/14/15 5\' 10"  (1.778 m)    Weight:   Wt Readings from Last 1 Encounters:  03/19/15 151 lb 7.3 oz (68.7 kg)   Wt Readings from Last 50 Encounters:  03/19/15 151 lb 7.3 oz (68.7 kg)  10/22/14 182 lb (82.555 kg)  08/13/14 167 lb 15.9 oz (76.2 kg)  05/18/14 167 lb (75.751 kg)  05/14/14 167 lb 1.7 oz (75.8 kg)  02/26/14 186 lb 6.4 oz (84.55 kg)  01/14/13 170 lb (77.111 kg)  05/28/12 157 lb (71.215 kg)   04/16/12 157 lb (71.215 kg)  02/27/12 163 lb (73.936 kg)  02/27/12 163 lb (73.936 kg)    Ideal Body Weight:  75.5 kg  BMI:  Body mass index is 21.73 kg/(m^2).  Estimated Nutritional Needs:   Kcal:  1900-2100  Protein:  90-105 grams  Fluid:  1.9 - 2.1 L/day  EDUCATION NEEDS:   No education needs identified at this time  Corrin Parker, MS, RD, LDN Pager # [phone] After hours/ weekend pager # [phone]

## 2015-03-20 DIAGNOSIS — J9601 Acute respiratory failure with hypoxia: Secondary | ICD-10-CM

## 2015-03-20 DIAGNOSIS — J13 Pneumonia due to Streptococcus pneumoniae: Secondary | ICD-10-CM | POA: Diagnosis present

## 2015-03-20 LAB — CBC
HCT: 36.9 % — ABNORMAL LOW (ref 39.0–52.0)
Hemoglobin: 13 g/dL (ref 13.0–17.0)
MCH: 30 pg (ref 26.0–34.0)
MCHC: 35.2 g/dL (ref 30.0–36.0)
MCV: 85 fL (ref 78.0–100.0)
Platelets: 227 10*3/uL (ref 150–400)
RBC: 4.34 MIL/uL (ref 4.22–5.81)
RDW: 13.7 % (ref 11.5–15.5)
WBC: 6.7 10*3/uL (ref 4.0–10.5)

## 2015-03-20 LAB — CULTURE, BLOOD (ROUTINE X 2)
Culture: NO GROWTH
Culture: NO GROWTH

## 2015-03-20 LAB — GLUCOSE, CAPILLARY
Glucose-Capillary: 95 mg/dL (ref 65–99)
Glucose-Capillary: 201 mg/dL — ABNORMAL HIGH (ref 65–99)
Glucose-Capillary: 146 mg/dL — ABNORMAL HIGH (ref 65–99)

## 2015-03-20 MED ORDER — MORPHINE SULFATE ER 15 MG PO TBCR: 15.0000 mg | EXTENDED_RELEASE_TABLET | Freq: Three times a day (TID) | ORAL | Status: DC | PRN

## 2015-03-20 MED ORDER — MORPHINE SULFATE ER 15 MG PO TBCR: 15.0000 mg | EXTENDED_RELEASE_TABLET | Freq: Two times a day (BID) | ORAL | Status: DC

## 2015-03-20 MED ORDER — DILTIAZEM HCL 60 MG PO TABS: 60.0000 mg | ORAL_TABLET | Freq: Four times a day (QID) | ORAL | Status: DC

## 2015-03-20 NOTE — Evaluation (Signed)
Physical Therapy Evaluation Patient Details Name: Kirk Robinson MRN: [id number] DOB: 06/23/1956 Today's Date: 03/20/2015   History of Present Illness  Patient is a 59 yo male admitted 03/13/15 with AMS, ?ingestion of substance, resp failure with hypoxia, Afib with RVR.  PMH:  Hemorrhagic CVA, hydrocephalus, VP shunt; COPD; home O2 at 3 l/min; Afib; DM; Anxiety  Clinical Impression  Patient presents with problems listed below.  Will benefit from acute PT to maximize functional mobility prior to discharge home with wife.  Patient with decreased safety awareness and decreased balance.  Instructed patient to use RW at all times with gait.  Recommended HHPT - patient and wife state that "he usually gets his strength back".  If not progressing at home, encouraged patient to contact MD for HHPT referral at that time.    Follow Up Recommendations Home health PT (Patient declined HHPT, stating he will get his strength back); Supervision for mobility/OOB    Equipment Recommendations  None recommended by PT    Recommendations for Other Services       Precautions / Restrictions Precautions Precautions: Fall Restrictions Weight Bearing Restrictions: No      Mobility  Bed Mobility Overal bed mobility: Independent                Transfers Overall transfer level: Needs assistance Equipment used: Rolling walker (2 wheeled) Transfers: Sit to/from Stand Sit to Stand: Min guard         General transfer comment: Assist for safety/balance.  Cues to move slowly and to stand several seconds prior to beginning ambulation  Ambulation/Gait Ambulation/Gait assistance: Min guard;Min assist Ambulation Distance (Feet): 48 Feet Assistive device: Rolling walker (2 wheeled) Gait Pattern/deviations: Step-through pattern;Decreased stride length;Ataxic;Staggering left;Staggering right     General Gait Details: Verbal cues for safe use of RW and to move more slowly.  Patient occasionally staggering  to both sides, especially during turns.  Cues to move slowly for safety.  Stairs            Wheelchair Mobility    Modified Rankin (Stroke Patients Only)       Balance Overall balance assessment: Needs assistance         Standing balance support: Bilateral upper extremity supported Standing balance-Leahy Scale: Poor                               Pertinent Vitals/Pain Pain Assessment: No/denies pain    Home Living Family/patient expects to be discharged to:: Private residence Living Arrangements: Spouse/significant other Available Help at Discharge: Family;Available PRN/intermittently (Wife works) Type of Home: House Home Access: Stairs to enter Entrance Stairs-Rails: Right;Left;Can reach both Technical brewer of Steps: [place]: One level Home Equipment: Environmental consultant - 2 wheels;Cane - single point;Wheelchair - manual;Shower seat;Hand held shower head;Bedside commode      Prior Function Level of Independence: Needs assistance   Gait / Transfers Assistance Needed: Uses cane or RW intermittently, based on how he feels during day  ADL's / Homemaking Assistance Needed: reports that wife helps as needed, wife performs executive functions in home as pt with cognitive issues at baseline        Hand Dominance        Extremity/Trunk Assessment   Upper Extremity Assessment: Overall WFL for tasks assessed           Lower Extremity Assessment: Generalized weakness         Communication   Communication: No difficulties  Cognition Arousal/Alertness: Awake/alert Behavior During Therapy: Impulsive;Restless Overall Cognitive Status: History of cognitive impairments - at baseline Area of Impairment: Attention;Safety/judgement;Awareness   Current Attention Level: Sustained Memory: Decreased short-term memory   Safety/Judgement: Decreased awareness of deficits;Decreased awareness of safety     General Comments: Very impulsive with  decreased safety awareness    General Comments      Exercises        Assessment/Plan    PT Assessment Patient needs continued PT services  PT Diagnosis Abnormality of gait;Difficulty walking;Generalized weakness;Altered mental status   PT Problem List Decreased strength;Decreased activity tolerance;Decreased balance;Decreased mobility;Decreased cognition;Decreased coordination;Decreased knowledge of use of DME;Decreased safety awareness;Cardiopulmonary status limiting activity  PT Treatment Interventions DME instruction;Gait training;Stair training;Functional mobility training;Therapeutic activities;Balance training;Cognitive remediation;Patient/family education   PT Goals (Current goals can be found in the Care Plan section) Acute Rehab PT Goals Patient Stated Goal: To return home PT Goal Formulation: With patient/family Time For Goal Achievement: 03/27/15 Potential to Achieve Goals: Good    Frequency Min 3X/week   Barriers to discharge Decreased caregiver support Wife works    Co-evaluation               End of Session Equipment Utilized During Treatment: Gait belt;Oxygen Activity Tolerance: Patient limited by fatigue Patient left: in bed;with call bell/phone within reach;with bed alarm set;with family/visitor present Nurse Communication: Mobility status         Time: 7121-9758 PT Time Calculation (min) (ACUTE ONLY): 24 min   Charges:   PT Evaluation $Initial PT Evaluation Tier I: 1 Procedure PT Treatments $Gait Training: 8-22 mins   PT G CodesDespina Robinson Mar 29, 2015, 3:36 PM Kirk Robinson. Kirk Robinson, [place] Pager [phone]
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
## 2015-03-20 NOTE — Discharge Instructions (Signed)
Follow with Kirk Forts, MD in 5-7 days  Please get a complete blood count and chemistry panel checked by your Primary MD at your next visit, and again as instructed by your Primary MD. Please get your medications reviewed and adjusted by your Primary MD.  Please request your Primary MD to go over all Hospital Tests and Procedure/Radiological results at the follow up, please get all Hospital records sent to your Prim MD by signing hospital release before you go home.  If you had Pneumonia of Lung problems at the Hospital: Please get a 2 view Chest X ray done in 6-8 weeks after hospital discharge or sooner if instructed by your Primary MD.  If you have Congestive Heart Failure: Please call your Cardiologist or Primary MD anytime you have any of the following symptoms:  1) 3 pound weight gain in 24 hours or 5 pounds in 1 week  2) shortness of breath, with or without a dry hacking cough  3) swelling in the hands, feet or stomach  4) if you have to sleep on extra pillows at night in order to breathe  Follow cardiac low salt diet and 1.5 lit/day fluid restriction.  If you have diabetes Accuchecks 4 times/day, Once in AM empty stomach and then before each meal. Log in all results and show them to your primary doctor at your next visit. If any glucose reading is under 80 or above 300 call your primary MD immediately.  If you have Seizure/Convulsions/Epilepsy: Please do not drive, operate heavy machinery, participate in activities at heights or participate in high speed sports until you have seen by Primary MD or a Neurologist and advised to do so again.  If you had Gastrointestinal Bleeding: Please ask your Primary MD to check a complete blood count within one week of discharge or at your next visit. Your endoscopic/colonoscopic biopsies that are pending at the time of discharge, will also need to followed by your Primary MD.  Get Medicines reviewed and adjusted. Please take all your  medications with you for your next visit with your Primary MD  Please request your Primary MD to go over all hospital tests and procedure/radiological results at the follow up, please ask your Primary MD to get all Hospital records sent to his/her office.  If you experience worsening of your admission symptoms, develop shortness of breath, life threatening emergency, suicidal or homicidal thoughts you must seek medical attention immediately by calling 911 or calling your MD immediately  if symptoms less severe.  You must read complete instructions/literature along with all the possible adverse reactions/side effects for all the Medicines you take and that have been prescribed to you. Take any new Medicines after you have completely understood and accpet all the possible adverse reactions/side effects.   Do not drive or operate heavy machinery when taking Pain medications.   Do not take more than prescribed Pain, Sleep and Anxiety Medications  Special Instructions: If you have smoked or chewed Tobacco  in the last 2 yrs please stop smoking, stop any regular Alcohol  and or any Recreational drug use.  Wear Seat belts while driving.  Please note You were cared for by a hospitalist during your hospital stay. If you have any questions about your discharge medications or the care you received while you were in the hospital after you are discharged, you can call the unit and asked to speak with the hospitalist on call if the hospitalist that took care of you is not available. Once you  are discharged, your primary care physician will handle any further medical issues. Please note that NO REFILLS for any discharge medications will be authorized once you are discharged, as it is imperative that you return to your primary care physician (or establish a relationship with a primary care physician if you do not have one) for your aftercare needs so that they can reassess your need for medications and monitor your  lab values.  You can reach the hospitalist office at phone [phone] or fax [phone]   If you do not have a primary care physician, you can call [phone] for a physician referral.  Activity: As tolerated with Full fall precautions use walker/cane & assistance as needed  Diet: regular  Disposition Home

## 2015-03-23 ENCOUNTER — Ambulatory Visit: Payer: 59 | Admitting: Family Medicine

## 2015-04-03 ENCOUNTER — Other Ambulatory Visit: Payer: Self-pay | Admitting: Physician Assistant

## 2015-04-17 ENCOUNTER — Encounter: Payer: Self-pay | Admitting: *Deleted

## 2015-04-17 ENCOUNTER — Encounter: Payer: 59 | Attending: Family Medicine | Admitting: *Deleted

## 2015-04-17 VITALS — Ht 69.0 in | Wt 162.5 lb

## 2015-04-17 DIAGNOSIS — Z713 Dietary counseling and surveillance: Secondary | ICD-10-CM | POA: Diagnosis not present

## 2015-04-17 DIAGNOSIS — E119 Type 2 diabetes mellitus without complications: Secondary | ICD-10-CM

## 2015-04-17 NOTE — Patient Instructions (Signed)
Plan:  Aim for 4 Carb Choices per meal (60 grams) +/- 1 either way  Aim for 0-2 Carbs per snack if hungry  Include protein in moderation with your meals and snacks Consider reading food labels for Total Carbohydrate of foods Continue with your activity level daily as tolerated Consider getting a meter and checking BG at alternate times per day   Continue taking Diabetes medication Metformin as directed by MD

## 2015-04-17 NOTE — Progress Notes (Signed)
Diabetes Self-Management Education  Visit Type: First/Initial  Appt. Start Time: 0800 Appt. End Time: 0930  04/17/2015  Mr. Kirk Robinson, identified by name and date of birth, is a 59 y.o. male with a diagnosis of Diabetes: Type 2.   ASSESSMENT  Height 5\' 9"  (1.753 m), weight 162 lb 8 oz (73.71 kg). Body mass index is 23.99 kg/(m^2).      Diabetes Self-Management Education - 04/17/15 0809    Visit Information   Visit Type First/Initial   Initial Visit   Diabetes Type Type 2   Are you currently following a meal plan? No   Are you taking your medications as prescribed? Yes  misses last heart medicine of the day on occasion   Date Diagnosed 2015   Health Coping   How would you rate your overall health? Fair   Psychosocial Assessment   Patient Belief/Attitude about Diabetes Other (comment)  hate that I have it, has several other medical conditions    Self-care barriers Impaired vision   Self-management support Family   Other persons present Patient;Spouse/SO   Patient Concerns Nutrition/Meal planning;Glycemic Control;Monitoring   Preferred Learning Style Visual   Learning Readiness Ready   Complications   Last HgB A1C per patient/outside source 8.5 %   How often do you check your blood sugar? 0 times/day (not testing)   Number of hypoglycemic episodes per month 0   Have you had a dilated eye exam in the past 12 months? No   Have you had a dental exam in the past 12 months? Yes   Are you checking your feet? No   Dietary Intake   Breakfast cereal with 2% milk, occasionally fresh fruit,   Snack (morning) cereal if anything OR sherbert OR popsicle   Lunch skips OR cereal OR sandwich   Snack (afternoon) same as AM   Dinner Flavored, low sugar oatmeal, 2 slices toast plain OR rarely meat, small potato or rice, vegetable and always bread   Snack (evening) same as AM   Beverage(s) diet soda   Exercise   Exercise Type Light (walking / raking leaves)  walks the dogs, works  outside mowing and cutting brush   How many days per week to you exercise? 5   How many minutes per day do you exercise? 60   Total minutes per week of exercise 300   Patient Education   Previous Diabetes Education No   Disease state  Definition of diabetes, type 1 and 2, and the diagnosis of diabetes;Factors that contribute to the development of diabetes   Nutrition management  Role of diet in the treatment of diabetes and the relationship between the three main macronutrients and blood glucose level;Food label reading, portion sizes and measuring food.;Carbohydrate counting;Reviewed blood glucose goals for pre and post meals and how to evaluate the patients' food intake on their blood glucose level.   Physical activity and exercise  Role of exercise on diabetes management, blood pressure control and cardiac health.   Medications Reviewed patients medication for diabetes, action, purpose, timing of dose and side effects.   Monitoring Purpose and frequency of SMBG.;Taught/discussed recording of test results and interpretation of SMBG.;Identified appropriate SMBG and/or A1C goals.   Chronic complications Relationship between chronic complications and blood glucose control;Assessed and discussed foot care and prevention of foot problems;Retinopathy and reason for yearly dilated eye exams   Psychosocial adjustment Role of stress on diabetes;Identified and addressed patients feelings and concerns about diabetes   Individualized Goals (developed by patient)  Nutrition Follow meal plan discussed   Physical Activity Exercise 5-7 days per week   Medications take my medication as prescribed   Monitoring  test blood glucose pre and post meals as discussed  Once he obtains Rx for meter and starts using it.   Reducing Risk examine blood glucose patterns   Outcomes   Expected Outcomes Demonstrated interest in learning. Expect positive outcomes   Future DMSE PRN  He and wife prefer to call as needed since  he has so many MD appointments already   Program Status Completed      Individualized Plan for Diabetes Self-Management Training:   Learning Objective:  Patient will have a greater understanding of diabetes self-management. Patient education plan is to attend individual and/or group sessions per assessed needs and concerns.   Plan:   Patient Instructions  Plan:  Aim for 4 Carb Choices per meal (60 grams) +/- 1 either way  Aim for 0-2 Carbs per snack if hungry  Include protein in moderation with your meals and snacks Consider reading food labels for Total Carbohydrate of foods Continue with your activity level daily as tolerated Consider getting a meter and checking BG at alternate times per day   Continue taking Diabetes medication Metformin as directed by MD      Expected Outcomes:  Demonstrated interest in learning. Expect positive outcomes  Education material provided: Living Well with Diabetes, A1C conversion sheet, Meal plan card and Carbohydrate counting sheet  If problems or questions, patient to contact team via:  Phone and Email  Future DSME appointment: PRN (He and wife prefer to call as needed since he has so many MD appointments already)

## 2015-04-24 ENCOUNTER — Encounter: Payer: Self-pay | Admitting: Family Medicine

## 2015-04-24 ENCOUNTER — Ambulatory Visit (INDEPENDENT_AMBULATORY_CARE_PROVIDER_SITE_OTHER): Payer: 59

## 2015-04-24 ENCOUNTER — Ambulatory Visit (INDEPENDENT_AMBULATORY_CARE_PROVIDER_SITE_OTHER): Payer: 59 | Admitting: Family Medicine

## 2015-04-24 VITALS — BP 110/71 | HR 73 | Temp 97.8°F | Resp 16 | Ht 69.0 in | Wt 163.4 lb

## 2015-04-24 DIAGNOSIS — J449 Chronic obstructive pulmonary disease, unspecified: Secondary | ICD-10-CM

## 2015-04-24 DIAGNOSIS — I4891 Unspecified atrial fibrillation: Secondary | ICD-10-CM | POA: Diagnosis not present

## 2015-04-24 DIAGNOSIS — R400 Somnolence: Secondary | ICD-10-CM

## 2015-04-24 DIAGNOSIS — J154 Pneumonia due to other streptococci: Secondary | ICD-10-CM

## 2015-04-24 DIAGNOSIS — F329 Major depressive disorder, single episode, unspecified: Secondary | ICD-10-CM

## 2015-04-24 DIAGNOSIS — M810 Age-related osteoporosis without current pathological fracture: Secondary | ICD-10-CM

## 2015-04-24 DIAGNOSIS — F418 Other specified anxiety disorders: Secondary | ICD-10-CM

## 2015-04-24 DIAGNOSIS — Z23 Encounter for immunization: Secondary | ICD-10-CM | POA: Diagnosis not present

## 2015-04-24 DIAGNOSIS — J8410 Pulmonary fibrosis, unspecified: Secondary | ICD-10-CM

## 2015-04-24 DIAGNOSIS — G894 Chronic pain syndrome: Secondary | ICD-10-CM | POA: Diagnosis not present

## 2015-04-24 DIAGNOSIS — K219 Gastro-esophageal reflux disease without esophagitis: Secondary | ICD-10-CM | POA: Diagnosis not present

## 2015-04-24 DIAGNOSIS — E7800 Pure hypercholesterolemia, unspecified: Secondary | ICD-10-CM

## 2015-04-24 DIAGNOSIS — F32A Depression, unspecified: Secondary | ICD-10-CM

## 2015-04-24 DIAGNOSIS — E119 Type 2 diabetes mellitus without complications: Secondary | ICD-10-CM | POA: Diagnosis not present

## 2015-04-24 DIAGNOSIS — F419 Anxiety disorder, unspecified: Secondary | ICD-10-CM

## 2015-04-24 LAB — COMPREHENSIVE METABOLIC PANEL
ALT: 14 U/L (ref 9–46)
AST: 14 U/L (ref 10–35)
Albumin: 4 g/dL (ref 3.6–5.1)
Alkaline Phosphatase: 60 U/L (ref 40–115)
BUN: 15 mg/dL (ref 7–25)
CO2: 32 mmol/L — ABNORMAL HIGH (ref 20–31)
Calcium: 9.8 mg/dL (ref 8.6–10.3)
Chloride: 100 mmol/L (ref 98–110)
Creat: 0.76 mg/dL (ref 0.70–1.33)
Glucose, Bld: 103 mg/dL — ABNORMAL HIGH (ref 65–99)
Potassium: 4 mmol/L (ref 3.5–5.3)
Sodium: 142 mmol/L (ref 135–146)
Total Bilirubin: 0.5 mg/dL (ref 0.2–1.2)
Total Protein: 6.6 g/dL (ref 6.1–8.1)

## 2015-04-24 LAB — CBC WITH DIFFERENTIAL/PLATELET
Basophils Absolute: 0 10*3/uL (ref 0.0–0.1)
Basophils Relative: 0 % (ref 0–1)
Eosinophils Absolute: 0.1 10*3/uL (ref 0.0–0.7)
Eosinophils Relative: 1 % (ref 0–5)
HCT: 41.3 % (ref 39.0–52.0)
Hemoglobin: 13.9 g/dL (ref 13.0–17.0)
Lymphocytes Relative: 23 % (ref 12–46)
Lymphs Abs: 2.2 10*3/uL (ref 0.7–4.0)
MCH: 29.6 pg (ref 26.0–34.0)
MCHC: 33.7 g/dL (ref 30.0–36.0)
MCV: 88.1 fL (ref 78.0–100.0)
MPV: 10.7 fL (ref 8.6–12.4)
Monocytes Absolute: 0.7 10*3/uL (ref 0.1–1.0)
Monocytes Relative: 7 % (ref 3–12)
Neutro Abs: 6.6 10*3/uL (ref 1.7–7.7)
Neutrophils Relative %: 69 % (ref 43–77)
Platelets: 240 10*3/uL (ref 150–400)
RBC: 4.69 MIL/uL (ref 4.22–5.81)
RDW: 15.4 % (ref 11.5–15.5)
WBC: 9.5 10*3/uL (ref 4.0–10.5)

## 2015-04-24 LAB — LIPID PANEL
Cholesterol: 151 mg/dL (ref 125–200)
HDL: 57 mg/dL (ref >=40)
LDL Cholesterol: 50 mg/dL (ref <130)
Total CHOL/HDL Ratio: 2.6 Ratio (ref <=5.0)
Triglycerides: 218 mg/dL — ABNORMAL HIGH (ref <150)
VLDL: 44 mg/dL — ABNORMAL HIGH (ref <30)

## 2015-04-24 LAB — HEMOGLOBIN A1C
Hgb A1c MFr Bld: 6.4 % — ABNORMAL HIGH (ref <5.7)
Mean Plasma Glucose: 137 mg/dL — ABNORMAL HIGH (ref <117)

## 2015-04-24 MED ORDER — DILTIAZEM HCL ER COATED BEADS 120 MG PO CP24: 120.0000 mg | ORAL_CAPSULE | Freq: Every day | ORAL | Status: DC

## 2015-04-24 NOTE — Progress Notes (Signed)
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 Chronic obstructive pulmonary disease, unspecified COPD, unspecified chronic bronchitis type   7. Chronic pain syndrome   8. Atrial fibrillation, unspecified   9. Type 2 diabetes mellitus without complication   10. Streptococcal pneumonia   11. Anxiety and depression   12. Need for prophylactic vaccination and inoculation against influenza   13. Need for prophylactic vaccination against Streptococcus pneumoniae (pneumococcus)     1. Altered mental status/somnolence: New. S/p admission; required intubation for airway protection.  Now completely resolved; wife reports that patient becomes somnolent when he misses narcotic doses.  No recurrent issues. 2.  Hypercholesterolemia: controlled;  obtain labs; continue medications. 3.  Pulmonary fibrosis: stable; followed by [facility]/Pulmonology.  Continue 3 liters n.c. Oxygen supplementation. 4.  Osteoporosis: stable; secondary to chronic steroid use. 5.  GERD: controlled. 6.  COPD: stable; wheezing on exam today; managed by pulmonology; no respiratory distress. 7.  Chronic pain syndrome: followed by Dr. Hardin Negus at pain management. MS contin dose recently decreased to 83m tid. 8.  Atrial fibrillation paroxysmal: New.  During admission; will transition Cardizem 649mqid to Cardizem CD 12014maily; may warrant increase at next visit. 9.  DMII: New; s/p diabetic education; obtain labs; provide rx for glucometer, test strips, lancets. 10.  Streptococcal pneumonia: New; during admission; s/p Rocephin during admission; repeat CXR stable/negative. 11. Depression with anxiety; stable at this time; no changes to management.   Orders Placed This Encounter  Procedures  . DG Chest 2 View    Standing Status: Future     Number of Occurrences: 1     Standing Expiration Date: 04/23/2016    Order Specific Question:  Reason for Exam (SYMPTOM  OR DIAGNOSIS REQUIRED)    Answer:  pneumonia during admission    Order Specific Question:  Preferred imaging location?    Answer:  External  . Flu Vaccine QUAD 36+ mos IM  . Pneumococcal polysaccharide vaccine 23-valent greater than or equal to 2yo subcutaneous/IM  . CBC with Differential/Platelet  . Comprehensive metabolic panel    Order Specific Question:  Has the patient fasted?    Answer:  Yes  . Hemoglobin A1c  . Lipid panel    Order Specific Question:  Has the patient fasted?    Answer:  Yes   Meds ordered this encounter  Medications  . morphine (MS CONTIN) 60 MG 12 hr tablet    Sig: Take 60 mg by mouth every 8 (eight) hours.    Refill:  0  . morphine (MS CONTIN) 100 MG 12 hr tablet    Sig: Take 100 mg by mouth every 8 (eight) hours.    Refill:  0  . diltiazem (CARDIZEM CD) 120 MG 24 hr  capsule    Sig: Take 1 capsule (120 mg total) by mouth daily.    Dispense:  30 capsule    Refill:  5  . glucose blood test strip    Sig: Check sugar once daily    Dispense:  100 each    Refill:  12  . Lancets (FREESTYLE) lancets    Sig: Use as instructed    Dispense:  100 each    Refill:  12    OK to dispense any lancets that insurance will cover; no specific brand name specified by  MD.  . blood glucose meter kit and supplies KIT    Sig: Dispense based on patient and insurance preference. Check sugar once daily for DMII. Ell.9    Dispense:  1 each    Refill:  0    Order Specific Question:  Number of strips    Answer:  100    Order Specific Question:  Number of lancets    Answer:  100    Return in about 3 months (around 07/24/2015) for recheck.    Kristi Elayne Guerin, M.D. [facility] [address] [place], [place]  [address] [phone] phone [phone] fax

## 2015-04-25 MED ORDER — FREESTYLE LANCETS MISC: Status: DC

## 2015-04-25 MED ORDER — BLOOD GLUCOSE MONITOR KIT: PACK | Status: DC

## 2015-04-25 MED ORDER — GLUCOSE BLOOD VI STRP: ORAL_STRIP | Status: DC

## 2015-04-28 ENCOUNTER — Other Ambulatory Visit: Payer: Self-pay | Admitting: Family Medicine

## 2015-05-16 ENCOUNTER — Other Ambulatory Visit: Payer: Self-pay | Admitting: Physician Assistant

## 2015-05-20 ENCOUNTER — Other Ambulatory Visit: Payer: Self-pay | Admitting: Family Medicine

## 2015-05-31 ENCOUNTER — Other Ambulatory Visit: Payer: Self-pay | Admitting: Physician Assistant

## 2015-07-02 ENCOUNTER — Other Ambulatory Visit: Payer: Self-pay | Admitting: Family Medicine

## 2015-07-14 ENCOUNTER — Other Ambulatory Visit: Payer: Self-pay | Admitting: Physician Assistant

## 2015-07-31 ENCOUNTER — Ambulatory Visit (INDEPENDENT_AMBULATORY_CARE_PROVIDER_SITE_OTHER): Payer: 59 | Admitting: Family Medicine

## 2015-07-31 ENCOUNTER — Encounter: Payer: Self-pay | Admitting: Family Medicine

## 2015-07-31 VITALS — BP 103/60 | HR 76 | Temp 98.5°F | Resp 16 | Ht 68.75 in | Wt 152.4 lb

## 2015-07-31 DIAGNOSIS — Z1159 Encounter for screening for other viral diseases: Secondary | ICD-10-CM

## 2015-07-31 DIAGNOSIS — J449 Chronic obstructive pulmonary disease, unspecified: Secondary | ICD-10-CM | POA: Diagnosis not present

## 2015-07-31 DIAGNOSIS — F329 Major depressive disorder, single episode, unspecified: Secondary | ICD-10-CM

## 2015-07-31 DIAGNOSIS — Z114 Encounter for screening for human immunodeficiency virus [HIV]: Secondary | ICD-10-CM

## 2015-07-31 DIAGNOSIS — E119 Type 2 diabetes mellitus without complications: Secondary | ICD-10-CM | POA: Diagnosis not present

## 2015-07-31 DIAGNOSIS — G894 Chronic pain syndrome: Secondary | ICD-10-CM

## 2015-07-31 DIAGNOSIS — E785 Hyperlipidemia, unspecified: Secondary | ICD-10-CM | POA: Diagnosis not present

## 2015-07-31 DIAGNOSIS — F32A Depression, unspecified: Secondary | ICD-10-CM

## 2015-07-31 DIAGNOSIS — E890 Postprocedural hypothyroidism: Secondary | ICD-10-CM

## 2015-07-31 DIAGNOSIS — I4891 Unspecified atrial fibrillation: Secondary | ICD-10-CM

## 2015-07-31 MED ORDER — BLOOD GLUCOSE MONITOR KIT: PACK | Status: DC

## 2015-07-31 MED ORDER — DULOXETINE HCL 30 MG PO CPEP: 30.0000 mg | ORAL_CAPSULE | Freq: Every day | ORAL | Status: DC

## 2015-07-31 MED ORDER — GLUCOSE BLOOD VI STRP: ORAL_STRIP | Status: DC

## 2015-07-31 MED ORDER — FREESTYLE LANCETS MISC: Status: DC

## 2015-07-31 MED ORDER — DILTIAZEM HCL ER COATED BEADS 120 MG PO CP24: 120.0000 mg | ORAL_CAPSULE | Freq: Every day | ORAL | Status: DC

## 2015-07-31 NOTE — Patient Instructions (Signed)
1.  In January, Decrease to 150mg  of Sertraline daily (1.5 tablets daily) for two weeks, THEN Decrease to 100mg  of Sertraline daily (1 tablet daily) for two weeks, THEN Decrease to 50mg  of Sertraline daily (1/2 tablet daily ) for two weeks, then Decrease to 50mg  of Sertraline every other day for two weeks, THEN STOP. 2.  Start Cymbalta 30mg  one tablet daily for one month THEN [facility] for prescription Cymbalta 60mg  one tablet daily.

## 2015-07-31 NOTE — Progress Notes (Signed)
Subjective:    Patient ID: Kirk Robinson, male    DOB: [birth year], 57 y.o.   MRN: [id number]  07/31/2015  Follow-up   HPI This 59 y.o. male presents for three month follow-up:  1.  DMII: weight down nine pounds since last visit.  Patient reports good compliance with medication, good tolerance to medication, and good symptom control.  Not checking sugars.  S/p diabetic education.  Insurance not paying for test strips.    2.  Atrial fibrillation:  Switched Cardizem to Cardizem CD 134m daily.  BP running a little low.    3.  Streptococcal pneumonia:  No recurrent infections since last visit; s/p CXR at last visit; no persistent infiltrate.  4.  RLE wound: doing work outside on steps; slipped and hit R medial leg on step; peeled skin back; occurred 3 weeks ago.    5.  Chronic pain syndrome:   Followed by Dr. PHardin Negus followed every two months.    6.  COPD:  No recent issues; forgot oxygen today.  Prednisone 13mdaily.  S/p FlRaul Delollow-up two months ago.  7.  Hypothyroidism: recent follow-up Moriyati.  8.  Osteoporosis: due for Reclast infusion.  9. Depression and anxiety:  Fluctuating.  Taking 20018mertraline daily.  Bad days involve hopeless, tired, no motivation.  Every other day.  Usually lasts all day.  Sometimes two days in row.  If does not take medication, declines quickly.  Gets RLS if late.   Previous trial of Prozac.   Review of Systems  Constitutional: Negative for fever, chills, diaphoresis, activity change, appetite change and fatigue.  Respiratory: Negative for cough and shortness of breath.   Cardiovascular: Negative for chest pain, palpitations and leg swelling.  Gastrointestinal: Negative for nausea, vomiting, abdominal pain and diarrhea.  Endocrine: Negative for cold intolerance, heat intolerance, polydipsia, polyphagia and polyuria.  Skin: Negative for color change, rash and wound.  Neurological: Negative for dizziness, tremors, seizures, syncope, facial  asymmetry, speech difficulty, weakness, light-headedness, numbness and headaches.  Psychiatric/Behavioral: Negative for sleep disturbance and dysphoric mood. The patient is not nervous/anxious.     Past Medical History  Diagnosis Date  . Altered mental status   . Memory loss   . Osteoporosis, unspecified     bone density per Morivati in 2012  . Iodine hypothyroidism   . Anxiety state, unspecified   . Pure hypercholesterolemia   . Hematuria 03/16/2007  . Allergic rhinitis, cause unspecified   . Alcohol use (HCC[place]own7/25/2008    patient risk factors  . Esophageal reflux   . Tobacco use disorder 03/16/2007    patient risk factors  . 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 Cervicalgia 09/17/2007  . Diplopia 12/21/2007  . Other abnormal glucose 04/08/2008  . Personal history of colonic polyps   . Other diseases of lung, not elsewhere classified 01/23/2009    s/p [facility]/pulmonology consult; intolerant  to Advair  . Gastritis 05/13/2009  . Peripheral vascular disease, unspecified (HCCSwannanoa5/12/2009  . Unspecified hypothyroidism 01/07/2010  . Hyperthyroidism     graves disease  . COPD (chronic obstructive pulmonary disease) ([facility]CC[place] . Shortness of breath   . Stroke (HCCParaje005  . Pneumonia, organism unspecified   . NSIP (nonspecific interstitial pneumonia) (HCC[place]   on azathioprine  . HCAP (healthcare-associated pneumonia) 08/12/2014  . [facility] complication [facility]  Past Surgical History  Procedure Laterality Date  . Eye surgery  2010  . 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 Admission 11/2011      altered mental status/confusion with syncope.  CT head negative, MRI brain negative, EEG: diffuse slowing but no  seizure acitivity.  Labs negative.  [facility].  [facility]  04/2012    poor short-term memory, poor recall, delayed reaction time; permanently disabled.    . Laparoscopic revision ventricular-peritoneal  (v-p) shunt Right 05/20/2014    Procedure: LAPAROSCOPIC REVISION VENTRICULAR-PERITONEAL (V-P) SHUNT;  Surgeon: Georganna Skeans, MD;  Location: [facility];  Service: General;  Laterality: Right;   Allergies  Allergen Reactions  . Advair Diskus [Fluticasone-Salmeterol] Shortness Of Breath  . Erythromycin Nausea And Vomiting  . Iodinated Diagnostic Agents Shortness Of Breath  . Darvocet [Propoxyphene N-Acetaminophen] Nausea And Vomiting  . Doxycycline Hyclate Nausea And Vomiting, Swelling and Other (See Comments)    Tongue swelling, severe depression  . Septra [Sulfamethoxazole-Trimethoprim] Rash    Social History   Social History  . Marital Status: Married    Spouse Name: N/A  . Number of Children: 2  . Years of Education: N/A   Occupational History  .      works full time 40 hrs   Social History Main Topics  . Smoking status: Current Every Day Smoker -- 1.00 packs/day for 30 years  . Smokeless tobacco: Never Used  . Alcohol Use: No  . Drug Use: No  . Sexual Activity: No   Other Topics Concern  . Not on file   Social History Narrative   Marital status:  Married x 35 years.       Children:  Children ages 48, 52.   2 grandchildren (49, 3).      Lives: with wife.     Employment: disability since 2013.       Tobacco:  1/2 ppd x 40 years.      Alcohol: rarely.      Exercise: walking the dogs daily.   Caffeine use: Carbonated beverages, Pepsi: Coffee, many servings a day Pepsi 3-4  Coffee 2 cups. Smoke detectors in home. Always wears seatbelts. Exercise: Inactive. No guns in the home.   Family History  Problem Relation Age of Onset  . Heart disease Father     cad, chf  . Cancer Father   . Heart disease Brother 49    CABG  . Cancer Brother 47    pancreatic cancer  . Diabetes Brother   . Diabetes Brother   . Heart disease Brother 31    CABG  . Diabetes Brother   . Heart disease Brother 12    CABG  . Cancer Brother 58    prostate cancer  . Heart disease Mother      cad  . Diabetes Mother   . Stroke Mother        Objective:    BP 103/60 mmHg  Pulse 76  Temp(Src) 98.5 F (36.9 C) (Oral)  Resp 16  Ht 5' 8.75" (1.746 m)  Wt 152 lb 6.4 oz (69.128 kg)  BMI 22.68 kg/m2  SpO2 94% Physical Exam  Constitutional: He is oriented to person, place, and time. He appears well-developed and well-nourished. No distress.  HENT:  Head: Normocephalic and atraumatic.  Right Ear: External ear normal.  Left Ear: External ear normal.  Nose: Nose normal.  Mouth/Throat: Oropharynx is clear and moist.  Eyes: Conjunctivae and EOM are normal. Pupils are equal, round, and reactive to light.  Neck: Normal range of motion. Neck supple. Carotid bruit is not present. No  thyromegaly present.  Cardiovascular: Normal rate, regular rhythm, normal heart sounds and intact distal pulses.  Exam reveals no gallop and no friction rub.   No murmur heard. Pulmonary/Chest: Effort normal and breath sounds normal. He has no wheezes. He has no rales.  Abdominal: Soft. Bowel sounds are normal. He exhibits no distension and no mass. There is no tenderness. There is no rebound and no guarding.  Lymphadenopathy:    He has no cervical adenopathy.  Neurological: He is alert and oriented to person, place, and time. No cranial nerve deficit.  Skin: Skin is warm and dry. No rash noted. He is not diaphoretic.  Psychiatric: He has a normal mood and affect. His behavior is normal.  Nursing note and vitals reviewed.       Assessment & Plan:   1. Type 2 diabetes mellitus without complication, without long-term current use of insulin ([place])   2. Screening for HIV (human immunodeficiency virus)   3. Need for hepatitis C screening test   4. Hyperlipidemia   5. Depression   6. Chronic pain syndrome   7. Hypothyroidism following radioiodine therapy   8. Chronic obstructive pulmonary disease, unspecified COPD type ([place])   9. Atrial fibrillation, unspecified     Orders Placed This Encounter    Procedures  . CBC with Differential/Platelet  . Comprehensive metabolic panel    Order Specific Question:  Has the patient fasted?    Answer:  Yes  . Hemoglobin A1c  . Lipid panel    Order Specific Question:  Has the patient fasted?    Answer:  Yes  . HIV antibody  . Hepatitis C antibody   Meds ordered this encounter  Medications  . DISCONTD: Lancets (FREESTYLE) lancets    Sig: Check sugars once daily.  Dx: DMII controlled without insulin    Dispense:  100 each    Refill:  12    OK to dispense any lancets that insurance will cover; no specific brand name specified by MD.  . DISCONTD: glucose blood test strip    Sig: Check sugar once daily    Dispense:  100 each    Refill:  12  . DISCONTD: blood glucose meter kit and supplies KIT    Sig: Dispense based on patient and insurance preference. Check sugar once daily for DMII. Ell.9    Dispense:  1 each    Refill:  0    Order Specific Question:  Number of strips    Answer:  100    Order Specific Question:  Number of lancets    Answer:  100  . DULoxetine (CYMBALTA) 30 MG capsule    Sig: Take 1 capsule (30 mg total) by mouth daily.    Dispense:  30 capsule    Refill:  0  . diltiazem (CARDIZEM CD) 120 MG 24 hr capsule    Sig: Take 1 capsule (120 mg total) by mouth daily.    Dispense:  30 capsule    Refill:  5    Return in about 3 months (around 10/29/2015) for recheck.    Kristi Elayne Guerin, M.D. [facility] [address] [place], [place]  [address] [phone] phone [phone] fax

## 2015-08-01 LAB — CBC WITH DIFFERENTIAL/PLATELET
Basophils Absolute: 0 10*3/uL (ref 0.0–0.1)
Basophils Relative: 0 % (ref 0–1)
Eosinophils Absolute: 0 10*3/uL (ref 0.0–0.7)
Eosinophils Relative: 0 % (ref 0–5)
HCT: 40.7 % (ref 39.0–52.0)
Hemoglobin: 14.5 g/dL (ref 13.0–17.0)
Lymphocytes Relative: 9 % — ABNORMAL LOW (ref 12–46)
Lymphs Abs: 1 10*3/uL (ref 0.7–4.0)
MCH: 30 pg (ref 26.0–34.0)
MCHC: 35.6 g/dL (ref 30.0–36.0)
MCV: 84.3 fL (ref 78.0–100.0)
MPV: 10.2 fL (ref 8.6–12.4)
Monocytes Absolute: 0.5 10*3/uL (ref 0.1–1.0)
Monocytes Relative: 4 % (ref 3–12)
Neutro Abs: 9.8 10*3/uL — ABNORMAL HIGH (ref 1.7–7.7)
Neutrophils Relative %: 87 % — ABNORMAL HIGH (ref 43–77)
Platelets: 253 10*3/uL (ref 150–400)
RBC: 4.83 MIL/uL (ref 4.22–5.81)
RDW: 13.9 % (ref 11.5–15.5)
WBC: 11.3 10*3/uL — ABNORMAL HIGH (ref 4.0–10.5)

## 2015-08-01 LAB — LIPID PANEL
Cholesterol: 169 mg/dL (ref 125–200)
HDL: 55 mg/dL (ref >=40)
LDL Cholesterol: 93 mg/dL (ref <130)
Total CHOL/HDL Ratio: 3.1 Ratio (ref <=5.0)
Triglycerides: 106 mg/dL (ref <150)
VLDL: 21 mg/dL (ref <30)

## 2015-08-01 LAB — COMPREHENSIVE METABOLIC PANEL
ALT: 12 U/L (ref 9–46)
AST: 14 U/L (ref 10–35)
Albumin: 4.1 g/dL (ref 3.6–5.1)
Alkaline Phosphatase: 72 U/L (ref 40–115)
BUN: 14 mg/dL (ref 7–25)
CO2: 26 mmol/L (ref 20–31)
Calcium: 9.5 mg/dL (ref 8.6–10.3)
Chloride: 99 mmol/L (ref 98–110)
Creat: 0.73 mg/dL (ref 0.70–1.33)
Glucose, Bld: 125 mg/dL — ABNORMAL HIGH (ref 65–99)
Potassium: 4 mmol/L (ref 3.5–5.3)
Sodium: 139 mmol/L (ref 135–146)
Total Bilirubin: 0.5 mg/dL (ref 0.2–1.2)
Total Protein: 7.1 g/dL (ref 6.1–8.1)

## 2015-08-01 LAB — HEPATITIS C ANTIBODY: HCV Ab: NEGATIVE

## 2015-08-01 LAB — HIV ANTIBODY (ROUTINE TESTING W REFLEX): HIV 1&2 Ab, 4th Generation: NONREACTIVE

## 2015-08-01 LAB — HEMOGLOBIN A1C
Hgb A1c MFr Bld: 6.8 % — ABNORMAL HIGH (ref <5.7)
Mean Plasma Glucose: 148 mg/dL — ABNORMAL HIGH (ref <117)

## 2015-08-02 ENCOUNTER — Other Ambulatory Visit: Payer: Self-pay | Admitting: Physician Assistant

## 2015-08-02 ENCOUNTER — Other Ambulatory Visit: Payer: Self-pay | Admitting: Family Medicine

## 2015-08-04 IMAGING — CR DG CHEST 1V
1 series · 1 of 1 positions shown · non-contrast
Comparison: 05/12/2014

CLINICAL DATA: Altered mental status.

EXAM:
CHEST - 1 VIEW

[view not recorded]
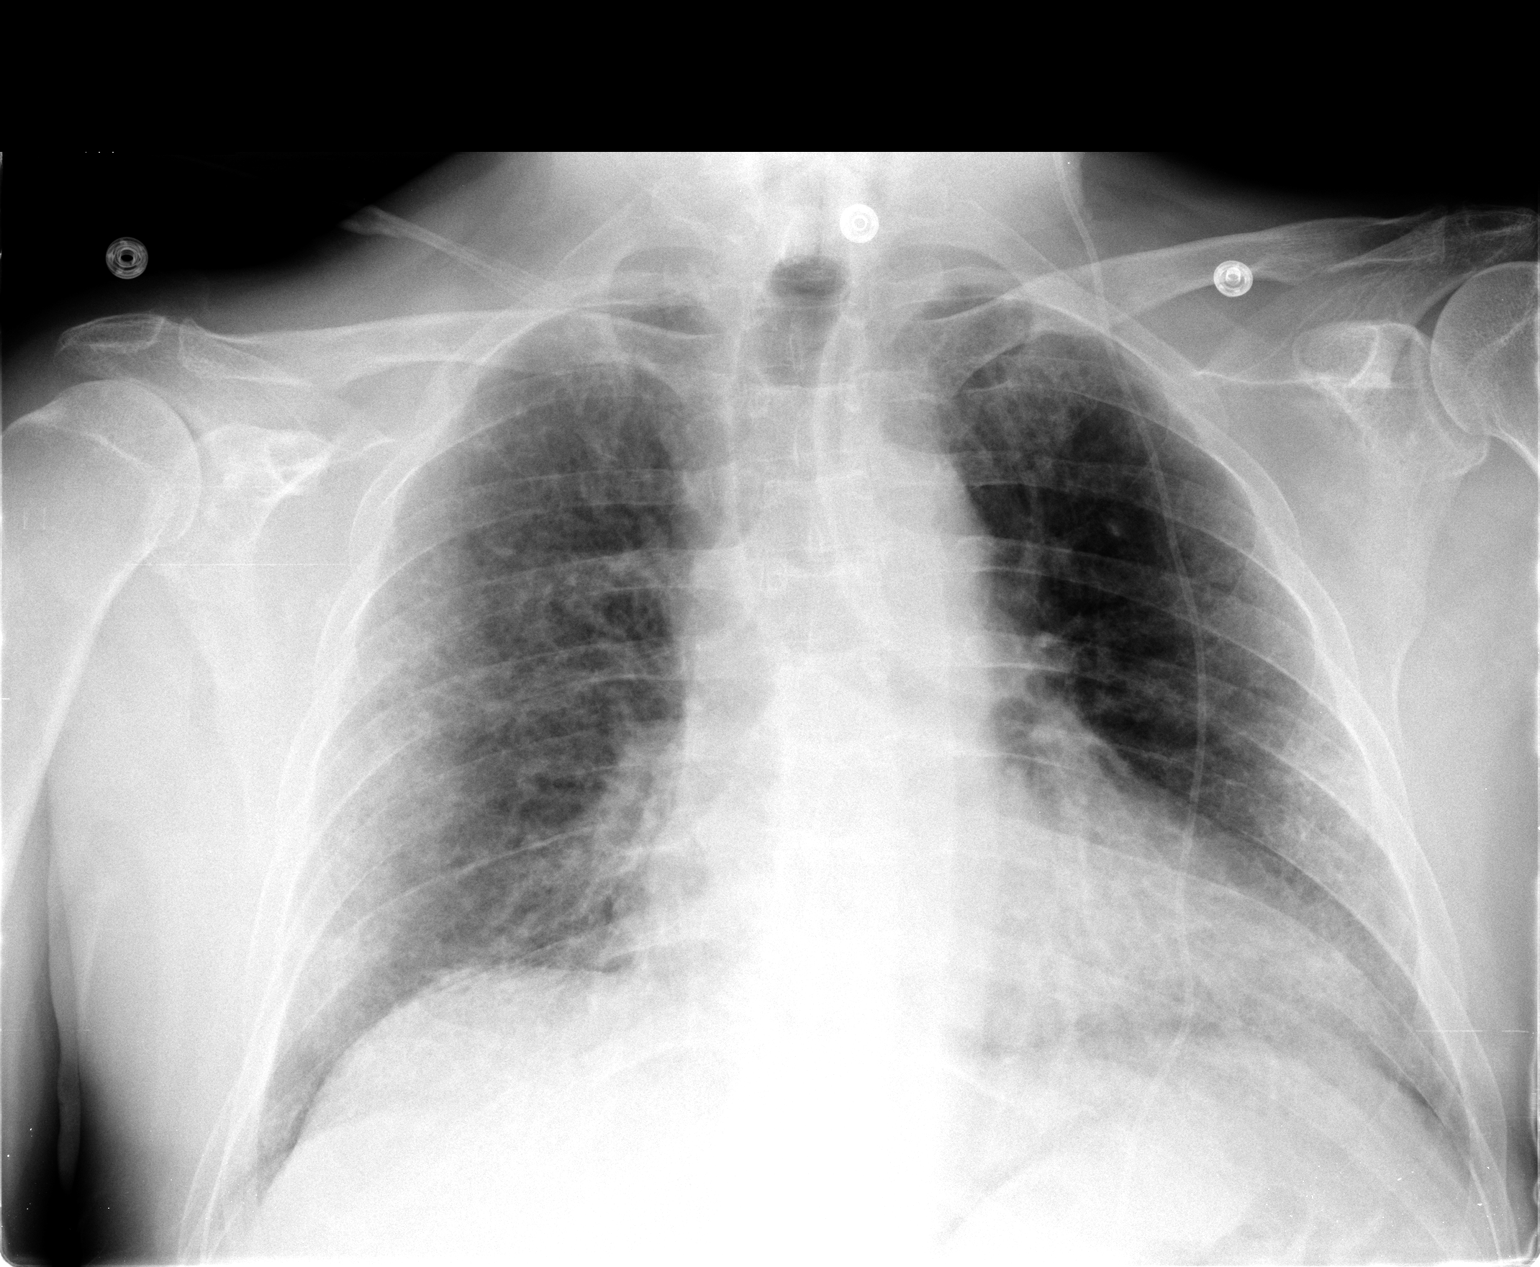

[1 of 1 positions shown; findings below may reference images not displayed]

FINDINGS: Mild cardiac enlargement. Pulmonary vascularity is normal. Diffuse
interstitial pattern may represent edema or interstitial
pneumonitis. Improving since previous study. Ventricular peritoneal
shunt tubing noted along the left chest. No blunting of costophrenic
angles. No pneumothorax.
IMPRESSION: Improving interstitial infiltration in the lungs.

## 2015-08-04 IMAGING — CR DG ABDOMEN 1V
1 series · 1 of 1 positions shown · non-contrast
Comparison: 05/12/2014

CLINICAL DATA: Altered mental status.

EXAM:
ABDOMEN - 1 VIEW

[t abdomen supine]
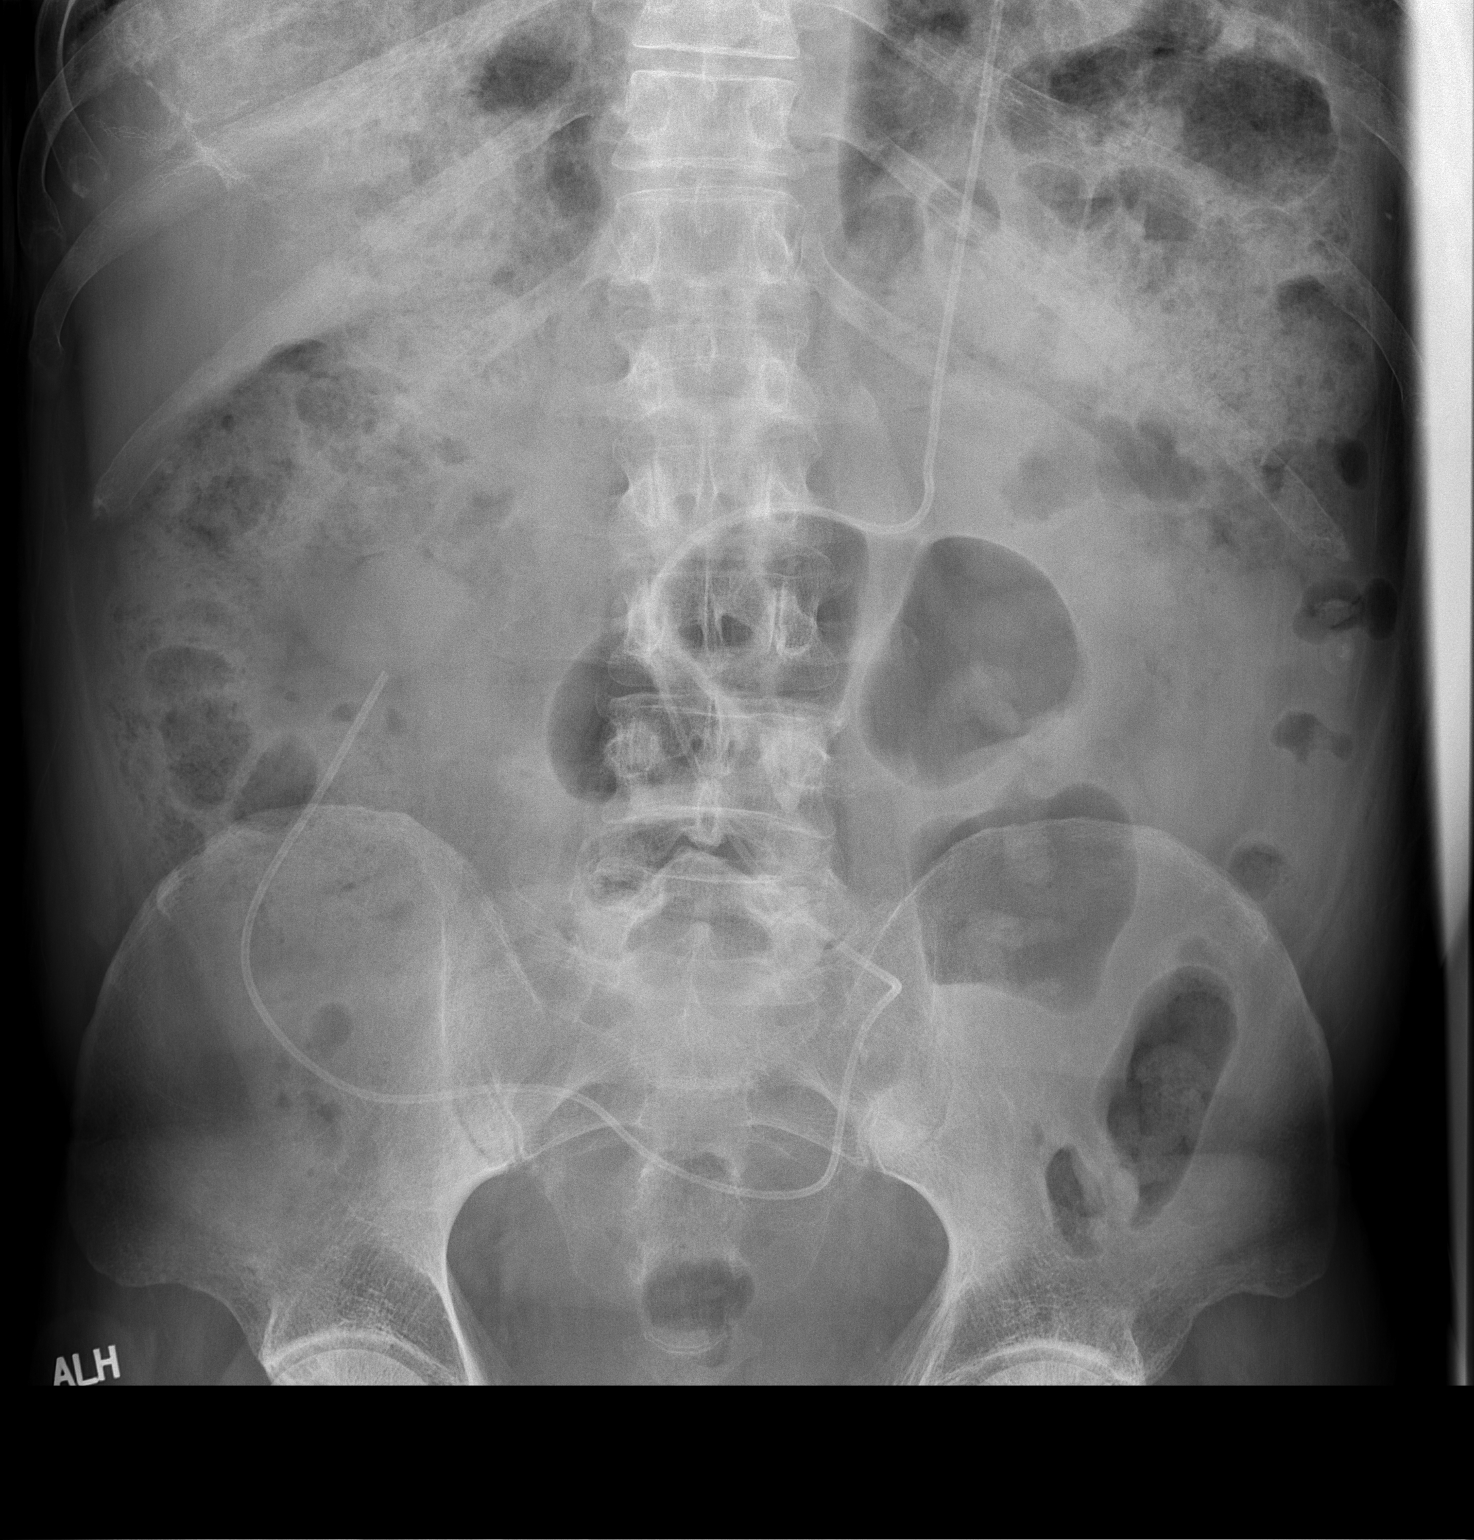

[1 of 1 positions shown; findings below may reference images not displayed]

FINDINGS: The bowel gas pattern is normal. No radio-opaque calculi or other
significant radiographic abnormality are seen. Ventricular
peritoneal shunt tubing demonstrated in the mid abdomen.
IMPRESSION: Nonobstructive bowel gas pattern.

## 2015-08-06 ENCOUNTER — Telehealth: Payer: Self-pay

## 2015-08-06 DIAGNOSIS — E119 Type 2 diabetes mellitus without complications: Secondary | ICD-10-CM

## 2015-08-06 MED ORDER — BLOOD GLUCOSE MONITOR KIT: PACK | Status: DC

## 2015-08-06 MED ORDER — GLUCOSE BLOOD VI STRP: ORAL_STRIP | Status: DC

## 2015-08-06 MED ORDER — FREESTYLE LANCETS MISC: Status: DC

## 2015-08-06 NOTE — Telephone Encounter (Signed)
[employer] called and asked to change glucose meter, strips and lancets to brand covered by ins, [employer]. I gave VO and changed in system for our records.

## 2015-08-07 IMAGING — CT CT HEAD W/O CM
1 series · 15 of 30 positions shown, 19 images · non-contrast
Comparison: CT of the head May 18, 2014

CLINICAL DATA: Follow-up ventriculoperitoneal shunt revision.

EXAM:
CT HEAD WITHOUT CONTRAST
TECHNIQUE: Contiguous axial images were obtained from the base of the skull
through the vertex without intravenous contrast.

[Series 2: head 5.0 h30s · axial · 0.49mm/px · z∈[-65,+70]mm · 15 of 31 slices shown, 19 images]
[im 2/31  brain]
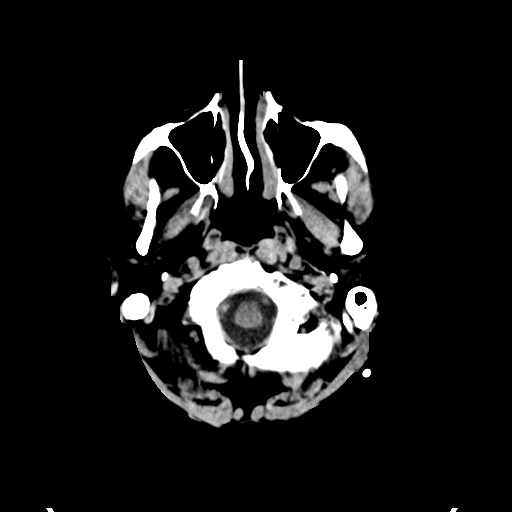
[im 2/31  bone]
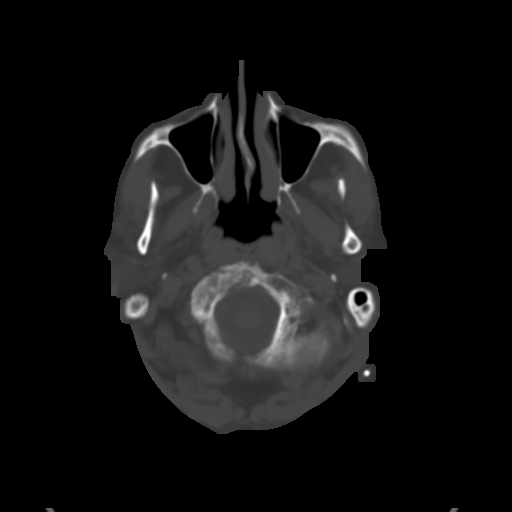
[im 4/31  brain]
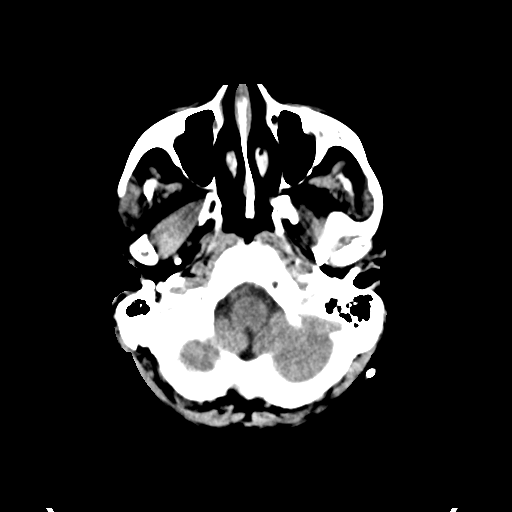
[im 6/31  brain]
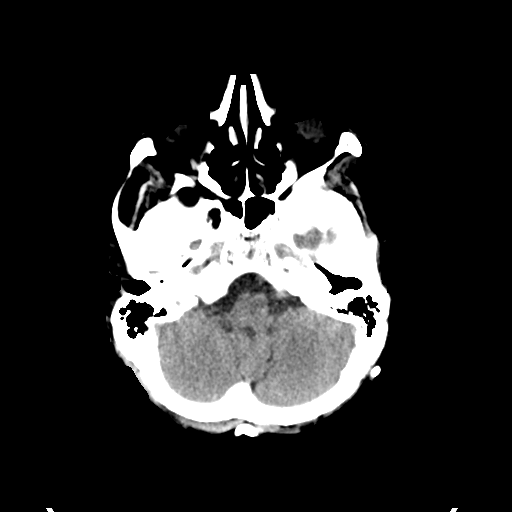
[im 8/31  brain]
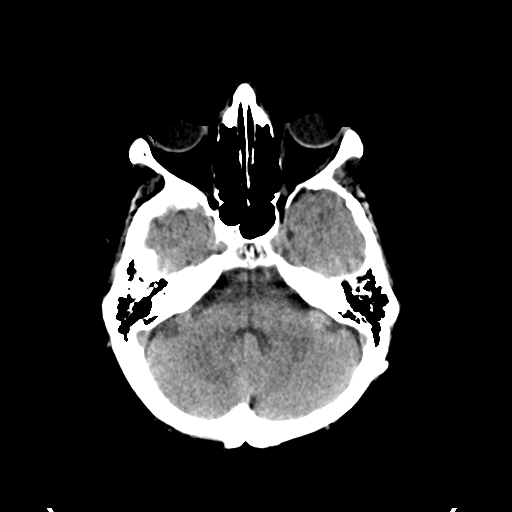
[im 10/31  brain]
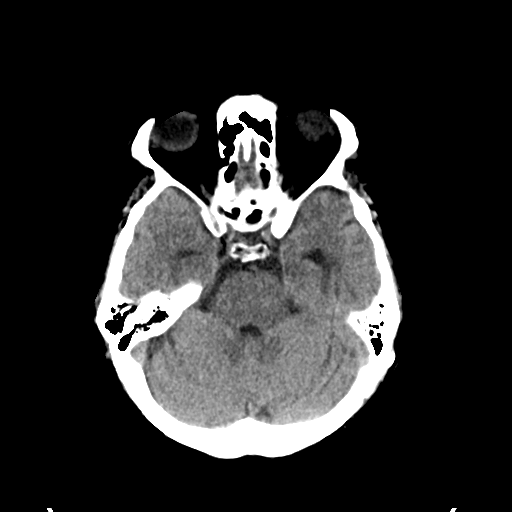
[im 10/31  bone]
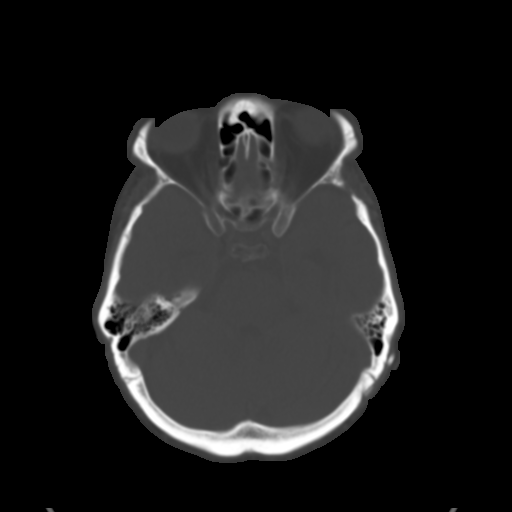
[im 12/31  brain]
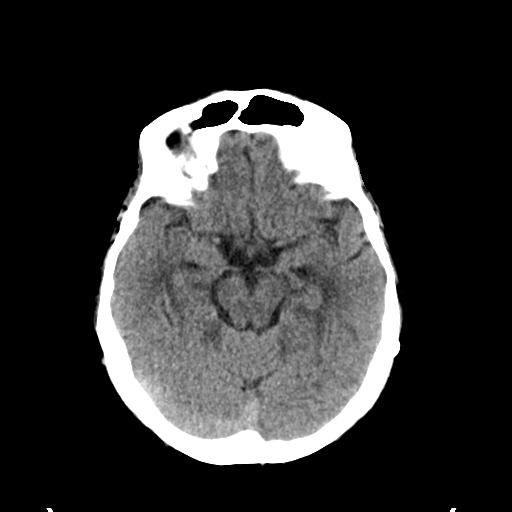
[im 14/31  brain]
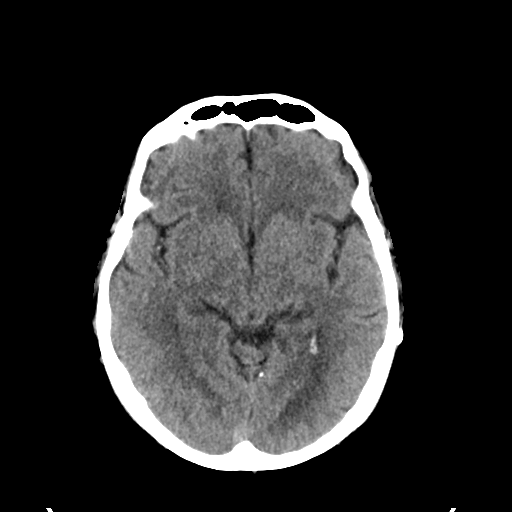
[im 16/31  brain]
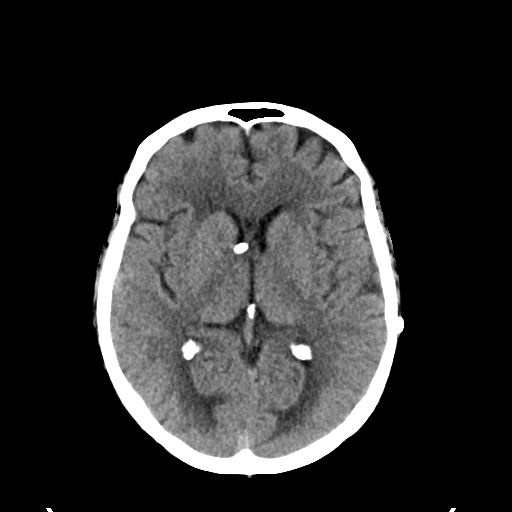
[im 17/31  brain]
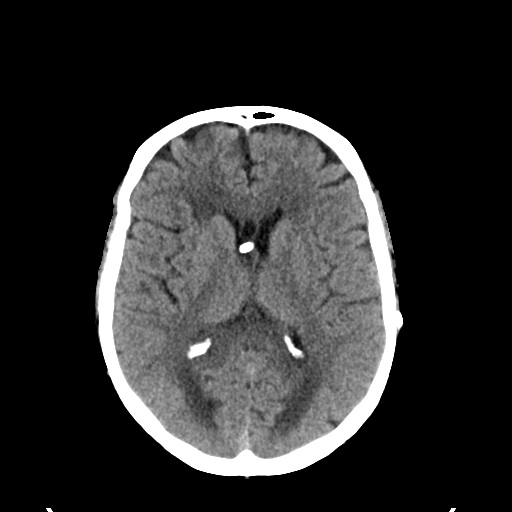
[im 17/31  bone]
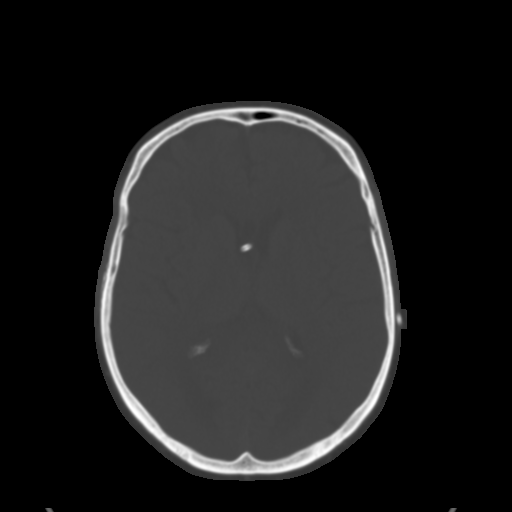
[im 19/31  brain]
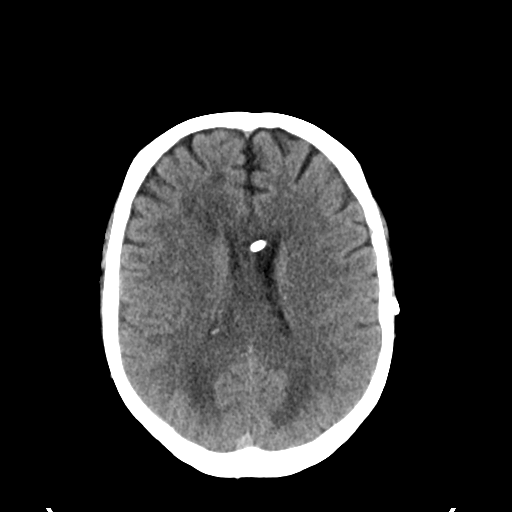
[im 21/31  brain]
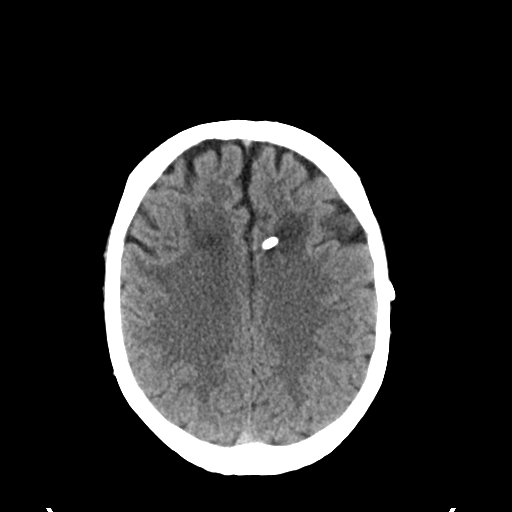
[im 23/31  brain]
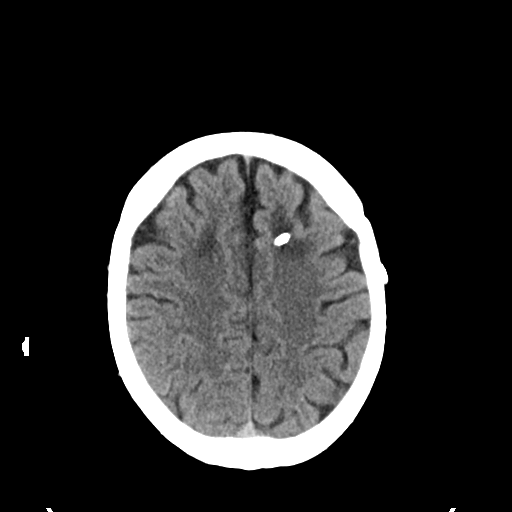
[im 25/31  brain]
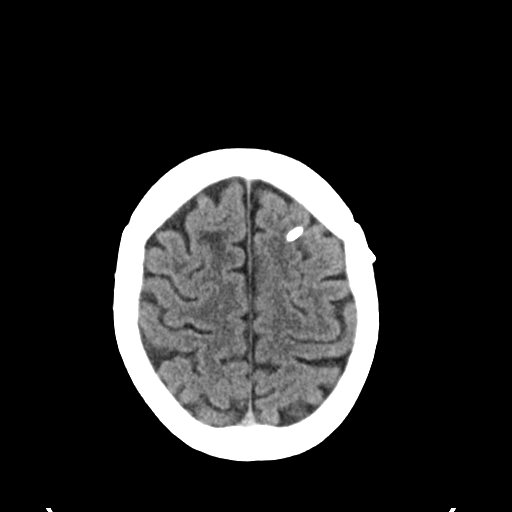
[im 25/31  bone]
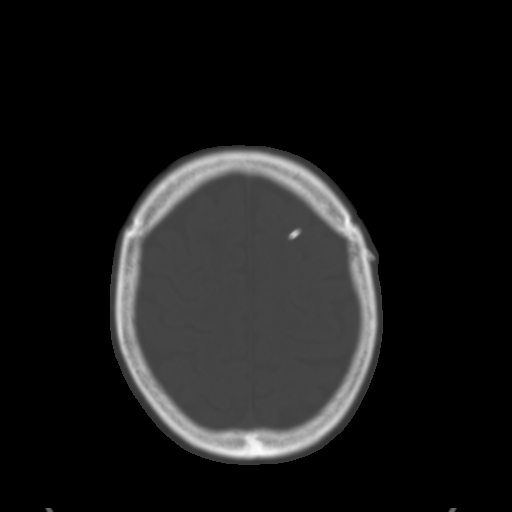
[im 27/31  brain]
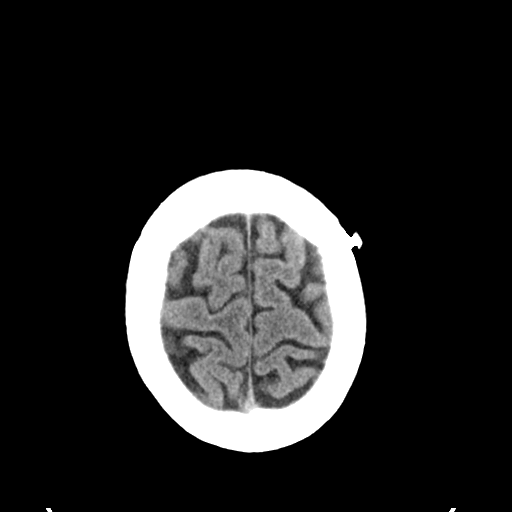
[im 29/31  brain]
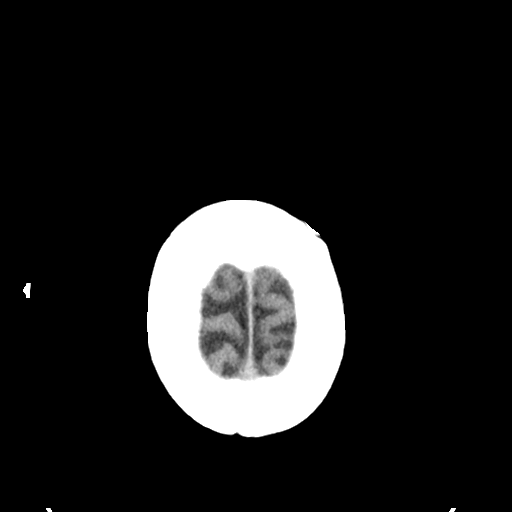

[15 of 30 positions shown; findings below may reference images not displayed]

FINDINGS: Ventriculoperitoneal shunt via high of LEFT frontal burr hole,
distal tip crosses midline terminating the frontal horn of the RIGHT
lateral ventricle. Resolution of hydrocephalus, ventricles are
somewhat slit-like on today's examination. Periventricular white
matter hypodensities are slightly decreased from prior examination.
Low-density along bifrontal catheter tracts. No intraparenchymal
hemorrhage, mass effect, midline shift or acute large vascular
territory infarct. Basal cisterns are patent.

High RIGHT frontal burr hole. No skull fracture. Ocular globes and
orbital contents are unremarkable. Visualized paranasal sinuses and
mastoid air cells appear well-aerated.
IMPRESSION: Ventriculoperitoneal catheter via LEFT frontal from burr hole,
distal tip in the RIGHT frontal horn of lateral ventricle, with
somewhat slit-like ventricles, this could reflect over shunting,
recommend correlation with shunt pressures.

Residual periventricular white matter hypodensities suggest
resolving transependymal flow of cerebral spinal fluid.

Similar low-density cytotoxic edema versus retrograde flow of CSF
the along the bifrontal catheter tracts.

  By: Lastenohjaaja Danielsson

## 2015-08-15 ENCOUNTER — Other Ambulatory Visit: Payer: Self-pay | Admitting: Family Medicine

## 2015-08-21 ENCOUNTER — Other Ambulatory Visit: Payer: Self-pay | Admitting: Physician Assistant

## 2015-08-22 ENCOUNTER — Other Ambulatory Visit: Payer: Self-pay | Admitting: Physician Assistant

## 2015-08-25 MED ORDER — METFORMIN HCL 1000 MG PO TABS: 1000.0000 mg | ORAL_TABLET | Freq: Two times a day (BID) | ORAL | Status: DC

## 2015-08-25 NOTE — Addendum Note (Signed)
Addended by: [name] on: 08/25/2015 11:02 PM   Modules accepted: Orders

## 2015-09-19 ENCOUNTER — Other Ambulatory Visit: Payer: Self-pay | Admitting: Family Medicine

## 2015-10-20 ENCOUNTER — Other Ambulatory Visit: Payer: Self-pay | Admitting: Family Medicine

## 2015-10-20 ENCOUNTER — Telehealth: Payer: Self-pay

## 2015-10-20 MED ORDER — SERTRALINE HCL 50 MG PO TABS: 50.0000 mg | ORAL_TABLET | Freq: Every day | ORAL | Status: DC

## 2015-10-20 NOTE — Telephone Encounter (Signed)
Ok to continue

## 2015-10-20 NOTE — Telephone Encounter (Signed)
I have sent in Zoloft 50mg  one tablet daily.  How long has patient NOT been taking Zoloft?  What happened when he took Cymbalta?

## 2015-10-20 NOTE — Telephone Encounter (Signed)
Pt is needing a refill on zoloft-he never was able to stop taking zoloft and take cymbalta   Best number [phone]

## 2015-10-21 NOTE — Telephone Encounter (Signed)
Dr. Tamala Julian  I spoke with his wife and she states that when they saw you she believes back in oct. And you discussed changing him from the zoloft to the cymbalta . She said that when he took the cymbalta it made him worst. I did tell her that you sent in a prescription but she said that is the wrong dose that it is suppose to be 200

## 2015-10-22 NOTE — Telephone Encounter (Signed)
Has he been taking 200mg  since his last visit?  If not, how much Zoloft is he taking now?

## 2015-10-23 NOTE — Telephone Encounter (Signed)
Spoke with pt, he states he cannot answer the questions at this time but will have his wife call back with the info.

## 2015-10-24 ENCOUNTER — Other Ambulatory Visit: Payer: Self-pay | Admitting: Family Medicine

## 2015-10-24 NOTE — Telephone Encounter (Signed)
Pt wife called stating that he has been taking 200mg  all along the cymbalta cause worse depression  Did get the 50mg  zoloft but will need more before visit with dr Lafe Garin number [phone]

## 2015-10-26 MED ORDER — SERTRALINE HCL 100 MG PO TABS: 200.0000 mg | ORAL_TABLET | Freq: Every day | ORAL | Status: DC

## 2015-10-26 NOTE — Telephone Encounter (Signed)
Advised pt Rx sent  To pharm. Pt understood.

## 2015-10-26 NOTE — Telephone Encounter (Signed)
Noted. My misunderstanding of previous messages.  I have sent in Sertraline 100mg  two daily for patient to fill next.

## 2015-10-30 ENCOUNTER — Ambulatory Visit: Payer: 59 | Admitting: Family Medicine

## 2015-11-10 ENCOUNTER — Encounter: Payer: Self-pay | Admitting: Family Medicine

## 2015-11-10 ENCOUNTER — Ambulatory Visit (INDEPENDENT_AMBULATORY_CARE_PROVIDER_SITE_OTHER): Payer: 59 | Admitting: Family Medicine

## 2015-11-10 VITALS — BP 112/69 | HR 86 | Temp 98.3°F | Resp 16 | Wt 149.0 lb

## 2015-11-10 DIAGNOSIS — J411 Mucopurulent chronic bronchitis: Secondary | ICD-10-CM

## 2015-11-10 DIAGNOSIS — J8410 Pulmonary fibrosis, unspecified: Secondary | ICD-10-CM | POA: Diagnosis not present

## 2015-11-10 DIAGNOSIS — I739 Peripheral vascular disease, unspecified: Secondary | ICD-10-CM

## 2015-11-10 DIAGNOSIS — E785 Hyperlipidemia, unspecified: Secondary | ICD-10-CM | POA: Diagnosis not present

## 2015-11-10 DIAGNOSIS — I4891 Unspecified atrial fibrillation: Secondary | ICD-10-CM | POA: Diagnosis not present

## 2015-11-10 DIAGNOSIS — K219 Gastro-esophageal reflux disease without esophagitis: Secondary | ICD-10-CM | POA: Diagnosis not present

## 2015-11-10 DIAGNOSIS — E119 Type 2 diabetes mellitus without complications: Secondary | ICD-10-CM | POA: Diagnosis not present

## 2015-11-10 DIAGNOSIS — M810 Age-related osteoporosis without current pathological fracture: Secondary | ICD-10-CM

## 2015-11-10 DIAGNOSIS — E890 Postprocedural hypothyroidism: Secondary | ICD-10-CM | POA: Diagnosis not present

## 2015-11-10 DIAGNOSIS — F32A Depression, unspecified: Secondary | ICD-10-CM

## 2015-11-10 DIAGNOSIS — G894 Chronic pain syndrome: Secondary | ICD-10-CM

## 2015-11-10 DIAGNOSIS — F329 Major depressive disorder, single episode, unspecified: Secondary | ICD-10-CM

## 2015-11-10 LAB — CBC WITH DIFFERENTIAL/PLATELET
Basophils Absolute: 0 10*3/uL (ref 0.0–0.1)
Basophils Relative: 0 % (ref 0–1)
Eosinophils Absolute: 0.1 10*3/uL (ref 0.0–0.7)
Eosinophils Relative: 1 % (ref 0–5)
HCT: 41.9 % (ref 39.0–52.0)
Hemoglobin: 14.2 g/dL (ref 13.0–17.0)
Lymphocytes Relative: 13 % (ref 12–46)
Lymphs Abs: 1.3 10*3/uL (ref 0.7–4.0)
MCH: 29.5 pg (ref 26.0–34.0)
MCHC: 33.9 g/dL (ref 30.0–36.0)
MCV: 87.1 fL (ref 78.0–100.0)
MPV: 10.7 fL (ref 8.6–12.4)
Monocytes Absolute: 0.5 10*3/uL (ref 0.1–1.0)
Monocytes Relative: 5 % (ref 3–12)
Neutro Abs: 8.3 10*3/uL — ABNORMAL HIGH (ref 1.7–7.7)
Neutrophils Relative %: 81 % — ABNORMAL HIGH (ref 43–77)
Platelets: 244 10*3/uL (ref 150–400)
RBC: 4.81 MIL/uL (ref 4.22–5.81)
RDW: 14.5 % (ref 11.5–15.5)
WBC: 10.2 10*3/uL (ref 4.0–10.5)

## 2015-11-10 MED ORDER — ATORVASTATIN CALCIUM 20 MG PO TABS: 20.0000 mg | ORAL_TABLET | Freq: Every day | ORAL | Status: DC

## 2015-11-10 MED ORDER — ESCITALOPRAM OXALATE 10 MG PO TABS: 10.0000 mg | ORAL_TABLET | Freq: Every day | ORAL | Status: DC

## 2015-11-10 NOTE — Progress Notes (Signed)
Subjective:    Patient ID: Kirk Robinson, male    DOB: [birth year], 58 y.o.   MRN: [id number]  11/10/2015  follow up antidepressant and depression screen 11   HPI This 60 y.o. male presents for evaluation of depression. Added 30mg  of Cymbalta to 150mg  of Zoloft.    Dr. Hardin Negus was worried about going up Cymbalta dose with Nucynta.  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    Chronic pain syndrome: DDD lumbar and cervical.    DMII:  Increased Metformin to 1000mg  bid.     Wheezing recently;   PAF: Diltiazem stable.   Review of Systems  Constitutional: Negative for fever, chills, diaphoresis, activity change, appetite change and fatigue.  Respiratory: Negative for cough and shortness of breath.   Cardiovascular: Negative for chest pain, palpitations and leg swelling.  Gastrointestinal: Negative for nausea, vomiting, abdominal pain and diarrhea.  Endocrine: Negative for cold intolerance, heat intolerance, polydipsia, polyphagia and polyuria.  Skin: Negative for color change, rash and wound.  Neurological: Negative for dizziness, tremors, seizures, syncope, facial asymmetry, speech difficulty, weakness, light-headedness, numbness and headaches.  Psychiatric/Behavioral: Negative for sleep disturbance and dysphoric mood. The patient is not nervous/anxious.     Past Medical History  Diagnosis Date  . Altered mental status   . Memory loss   . Osteoporosis, unspecified     bone density per [facility] in 2012  . Iodine hypothyroidism   . Anxiety state, unspecified   . Pure hypercholesterolemia   . Hematuria 03/16/2007  . Allergic rhinitis, cause unspecified   . Alcohol use ([place]) 03/16/2007    patient risk factors  . Esophageal reflux   . Tobacco use disorder 03/16/2007    patient risk factors  . 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 Cervicalgia 09/17/2007  . Diplopia 12/21/2007  . Other abnormal glucose  04/08/2008  . Personal history of colonic polyps   . Other diseases of lung, not elsewhere classified 01/23/2009    s/p [place]/pulmonology consult; intolerant  to Advair  . Gastritis 05/13/2009  . Peripheral vascular disease, unspecified ([place]) 12/24/2009  . Unspecified hypothyroidism 01/07/2010  . Hyperthyroidism     graves disease  . COPD (chronic obstructive pulmonary disease) ([place])   . Shortness of breath   . Stroke ([place]) 2005  . Pneumonia, organism unspecified   . NSIP (nonspecific interstitial pneumonia) ([place])     on azathioprine  . HCAP (healthcare-associated pneumonia) 08/12/2014  . Diabetes mellitus without complication [facility])    Past Surgical History  Procedure Laterality Date  . Eye surgery  2010  . 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 Admission 11/2011      altered mental status/confusion with syncope.  CT head negative, MRI brain negative, EEG: diffuse slowing but no  seizure acitivity.  Labs negative.  [facility].  [facility]  04/2012    poor short-term memory, poor recall, delayed reaction time; permanently disabled.    . Laparoscopic revision ventricular-peritoneal (v-p) shunt Right 05/20/2014    Procedure: LAPAROSCOPIC REVISION VENTRICULAR-PERITONEAL (V-P) SHUNT;  Surgeon: Georganna Skeans, MD;  Location: [facility];  Service: General;  Laterality: Right;   Allergies  Allergen Reactions  . Advair Diskus [Fluticasone-Salmeterol] Shortness Of Breath  . Erythromycin Nausea And Vomiting  . Iodinated Diagnostic Agents Shortness Of Breath  . Darvocet [Propoxyphene N-Acetaminophen] Nausea And Vomiting  . Doxycycline Hyclate Nausea And Vomiting, Swelling and Other (See Comments)  Tongue swelling, severe depression  . Septra [Sulfamethoxazole-Trimethoprim] Rash    Social History   Social History  . Marital Status: Married    Spouse Name: N/A  . Number of Children: 2  . Years of Education: N/A   Occupational History  .      works  full time 40 hrs   Social History Main Topics  . Smoking status: Current Every Day Smoker -- 1.00 packs/day for 30 years  . Smokeless tobacco: Never Used  . Alcohol Use: No  . Drug Use: No  . Sexual Activity: No   Other Topics Concern  . Not on file   Social History Narrative   Marital status:  Married x 35 years.       Children:  Children ages 61, 82.   2 grandchildren (1, 3).      Lives: with wife.     Employment: disability since 2013.       Tobacco:  1/2 ppd x 40 years.      Alcohol: rarely.      Exercise: walking the dogs daily.   Caffeine use: Carbonated beverages, Pepsi: Coffee, many servings a day Pepsi 3-4  Coffee 2 cups. Smoke detectors in home. Always wears seatbelts. Exercise: Inactive. No guns in the home.   Family History  Problem Relation Age of Onset  . Heart disease Father     cad, chf  . Cancer Father   . Heart disease Brother 15    CABG  . Cancer Brother 75    pancreatic cancer  . Diabetes Brother   . Diabetes Brother   . Heart disease Brother 59    CABG  . Diabetes Brother   . Heart disease Brother 67    CABG  . Cancer Brother 41    prostate cancer  . Heart disease Mother     cad  . Diabetes Mother   . Stroke Mother        Objective:    BP 112/69 mmHg  Pulse 86  Temp(Src) 98.3 F (36.8 C) (Oral)  Resp 16  Wt 149 lb (67.586 kg) Physical Exam  Constitutional: He is oriented to person, place, and time. He appears well-developed and well-nourished. No distress.  HENT:  Head: Normocephalic and atraumatic.  Right Ear: External ear normal.  Left Ear: External ear normal.  Nose: Nose normal.  Mouth/Throat: Oropharynx is clear and moist.  Eyes: Conjunctivae and EOM are normal. Pupils are equal, round, and reactive to light.  Neck: Normal range of motion. Neck supple. Carotid bruit is not present. No thyromegaly present.  Cardiovascular: Normal rate, regular rhythm, normal heart sounds and intact distal pulses.  Exam reveals no gallop and no  friction rub.   No murmur heard. Pulmonary/Chest: Effort normal and breath sounds normal. He has no wheezes. He has no rales.  Abdominal: Soft. Bowel sounds are normal. He exhibits no distension and no mass. There is no tenderness. There is no rebound and no guarding.  Lymphadenopathy:    He has no cervical adenopathy.  Neurological: He is alert and oriented to person, place, and time. No cranial nerve deficit.  Skin: Skin is warm and dry. No rash noted. He is not diaphoretic.  Psychiatric: He has a normal mood and affect. His behavior is normal.  Nursing note and vitals reviewed.       Assessment & Plan:   1. Type 2 diabetes mellitus without complication, without long-term current use of insulin ([place])   2. Peripheral vascular disease (  [place])   3. Osteoporosis   4. Hyperlipidemia   5. Gastroesophageal reflux disease without esophagitis   6. Depression   7. Chronic pain syndrome   8. Pulmonary fibrosis ([place])   9. Hypothyroidism following radioiodine therapy   10. Mucopurulent chronic bronchitis ([place])   11. Atrial fibrillation, unspecified     Orders Placed This Encounter  Procedures  . CBC with Differential/Platelet  . Comprehensive metabolic panel  . Hemoglobin A1c   Meds ordered this encounter  Medications  . escitalopram (LEXAPRO) 10 MG tablet    Sig: Take 1 tablet (10 mg total) by mouth at bedtime.    Dispense:  30 tablet    Refill:  5  . atorvastatin (LIPITOR) 20 MG tablet    Sig: Take 1 tablet (20 mg total) by mouth daily at 6 PM.    Dispense:  90 tablet    Refill:  3    Return in about 4 months (around 03/11/2016) for recheck.    Kristi Elayne Guerin, M.D. [facility] [address] [place], [place]  [address] [phone] phone [phone] fax

## 2015-11-10 NOTE — Patient Instructions (Addendum)
IF you received an x-ray today, you will receive an invoice from [facility]. Please contact [facility] at [phone] with questions or concerns regarding your invoice.   IF you received labwork today, you will receive an invoice from [employer]. Please contact [facility] at [phone] with questions or concerns regarding your invoice.   Our billing staff will not be able to assist you with questions regarding bills from these companies.  You will be contacted with the lab results as soon as they are available. The fastest way to get your results is to activate your My Chart account. Instructions are located on the last page of this paperwork. If you have not heard from [place] regarding the results in 2 weeks, please contact this office.    1.  Start Lexapro 10mg  1/2 tablet at bedtime.  Decrease Zoloft to 150mg  daily.  (for two weeks) 2. Then, increase Lexapro 10mg  one tablet daily.   Decrease Zoloft 100mg  daily. (for two weeks).   Impingement Syndrome, Rotator Cuff, Bursitis With Rehab Impingement syndrome is a condition that involves inflammation of the tendons of the rotator cuff and the subacromial bursa, that causes pain in the shoulder. The rotator cuff consists of four tendons and muscles that control much of the shoulder and upper arm function. The subacromial bursa is a fluid filled sac that helps reduce friction between the rotator cuff and one of the bones of the shoulder (acromion). Impingement syndrome is usually an overuse injury that causes swelling of the bursa (bursitis), swelling of the tendon (tendonitis), and/or a tear of the tendon (strain). Strains are classified into three categories. Grade 1 strains cause pain, but the tendon is not lengthened. Grade 2 strains include a lengthened ligament, due to the ligament being stretched or partially ruptured. With grade 2 strains there is still function, although the function may be  decreased. Grade 3 strains include a complete tear of the tendon or muscle, and function is usually impaired. SYMPTOMS   Pain around the shoulder, often at the outer portion of the upper arm.  Pain that gets worse with shoulder function, especially when reaching overhead or lifting.  Sometimes, aching when not using the arm.  Pain that wakes you up at night.  Sometimes, tenderness, swelling, warmth, or redness over the affected area.  Loss of strength.  Limited motion of the shoulder, especially reaching behind the back (to the back pocket or to unhook bra) or across your body.  Crackling sound (crepitation) when moving the arm.  Biceps tendon pain and inflammation (in the front of the shoulder). Worse when bending the elbow or lifting. CAUSES  Impingement syndrome is often an overuse injury, in which chronic (repetitive) motions cause the tendons or bursa to become inflamed. A strain occurs when a force is paced on the tendon or muscle that is greater than it can withstand. Common mechanisms of injury include: Stress from sudden increase in duration, frequency, or intensity of training.  Direct hit (trauma) to the shoulder.  Aging, erosion of the tendon with normal use.  Bony bump on shoulder (acromial spur). RISK INCREASES WITH:  Contact sports (football, wrestling, boxing).  Throwing sports (baseball, tennis, volleyball).  Weightlifting and bodybuilding.  Heavy labor.  Previous injury to the rotator cuff, including impingement.  Poor shoulder strength and flexibility.  Failure to warm up properly before activity.  Inadequate protective equipment.  Old age.  Bony bump on shoulder (acromial spur). PREVENTION   Warm up and stretch  properly before activity.  Allow for adequate recovery between workouts.  Maintain physical fitness:  Strength, flexibility, and endurance.  Cardiovascular fitness.  Learn and use proper exercise technique. PROGNOSIS  If  treated properly, impingement syndrome usually goes away within 6 weeks. Sometimes surgery is required.  RELATED COMPLICATIONS   Longer healing time if not properly treated, or if not given enough time to heal.  Recurring symptoms, that result in a chronic condition.  Shoulder stiffness, frozen shoulder, or loss of motion.  Rotator cuff tendon tear.  Recurring symptoms, especially if activity is resumed too soon, with overuse, with a direct blow, or when using poor technique. TREATMENT  Treatment first involves the use of ice and medicine, to reduce pain and inflammation. The use of strengthening and stretching exercises may help reduce pain with activity. These exercises may be performed at home or with a therapist. If non-surgical treatment is unsuccessful after more than 6 months, surgery may be advised. After surgery and rehabilitation, activity is usually possible in 3 months.  MEDICATION  If pain medicine is needed, nonsteroidal anti-inflammatory medicines (aspirin and ibuprofen), or other minor pain relievers (acetaminophen), are often advised.  Do not take pain medicine for 7 days before surgery.  Prescription pain relievers may be given, if your caregiver thinks they are needed. Use only as directed and only as much as you need.  Corticosteroid injections may be given by your caregiver. These injections should be reserved for the most serious cases, because they may only be given a certain number of times. HEAT AND COLD  Cold treatment (icing) should be applied for 10 to 15 minutes every 2 to 3 hours for inflammation and pain, and immediately after activity that aggravates your symptoms. Use ice packs or an ice massage.  Heat treatment may be used before performing stretching and strengthening activities prescribed by your caregiver, physical therapist, or athletic trainer. Use a heat pack or a warm water soak. SEEK MEDICAL CARE IF:   Symptoms get worse or do not improve in 4  to 6 weeks, despite treatment.  New, unexplained symptoms develop. (Drugs used in treatment may produce side effects.) EXERCISES  RANGE OF MOTION (ROM) AND STRETCHING EXERCISES - Impingement Syndrome (Rotator Cuff  Tendinitis, Bursitis) These exercises may help you when beginning to rehabilitate your injury. Your symptoms may go away with or without further involvement from your physician, physical therapist or athletic trainer. While completing these exercises, remember:   Restoring tissue flexibility helps normal motion to return to the joints. This allows healthier, less painful movement and activity.  An effective stretch should be held for at least 30 seconds.  A stretch should never be painful. You should only feel a gentle lengthening or release in the stretched tissue. STRETCH - Flexion, Standing  Stand with good posture. With an underhand grip on your right / left hand, and an overhand grip on the opposite hand, grasp a broomstick or cane so that your hands are a little more than shoulder width apart.  Keeping your right / left elbow straight and shoulder muscles relaxed, push the stick with your opposite hand, to raise your right / left arm in front of your body and then overhead. Raise your arm until you feel a stretch in your right / left shoulder, but before you have increased shoulder pain.  Try to avoid shrugging your right / left shoulder as your arm rises, by keeping your shoulder blade tucked down and toward your mid-back spine. Hold for __________  seconds.  Slowly return to the starting position. Repeat __________ times. Complete this exercise __________ times per day. STRETCH - Abduction, Supine  Lie on your back. With an underhand grip on your right / left hand and an overhand grip on the opposite hand, grasp a broomstick or cane so that your hands are a little more than shoulder width apart.  Keeping your right / left elbow straight and your shoulder muscles relaxed,  push the stick with your opposite hand, to raise your right / left arm out to the side of your body and then overhead. Raise your arm until you feel a stretch in your right / left shoulder, but before you have increased shoulder pain.  Try to avoid shrugging your right / left shoulder as your arm rises, by keeping your shoulder blade tucked down and toward your mid-back spine. Hold for __________ seconds.  Slowly return to the starting position. Repeat __________ times. Complete this exercise __________ times per day. ROM - Flexion, Active-Assisted  Lie on your back. You may bend your knees for comfort.  Grasp a broomstick or cane so your hands are about shoulder width apart. Your right / left hand should grip the end of the stick, so that your hand is positioned "thumbs-up," as if you were about to shake hands.  Using your healthy arm to lead, raise your right / left arm overhead, until you feel a gentle stretch in your shoulder. Hold for __________ seconds.  Use the stick to assist in returning your right / left arm to its starting position. Repeat __________ times. Complete this exercise __________ times per day.  ROM - Internal Rotation, Supine   Lie on your back on a firm surface. Place your right / left elbow about 60 degrees away from your side. Elevate your elbow with a folded towel, so that the elbow and shoulder are the same height.  Using a broomstick or cane and your strong arm, pull your right / left hand toward your body until you feel a gentle stretch, but no increase in your shoulder pain. Keep your shoulder and elbow in place throughout the exercise.  Hold for __________ seconds. Slowly return to the starting position. Repeat __________ times. Complete this exercise __________ times per day. STRETCH - Internal Rotation  Place your right / left hand behind your back, palm up.  Throw a towel or belt over your opposite shoulder. Grasp the towel with your right / left  hand.  While keeping an upright posture, gently pull up on the towel, until you feel a stretch in the front of your right / left shoulder.  Avoid shrugging your right / left shoulder as your arm rises, by keeping your shoulder blade tucked down and toward your mid-back spine.  Hold for __________ seconds. Release the stretch, by lowering your healthy hand. Repeat __________ times. Complete this exercise __________ times per day. ROM - Internal Rotation   Using an underhand grip, grasp a stick behind your back with both hands.  While standing upright with good posture, slide the stick up your back until you feel a mild stretch in the front of your shoulder.  Hold for __________ seconds. Slowly return to your starting position. Repeat __________ times. Complete this exercise __________ times per day.  STRETCH - Posterior Shoulder Capsule   Stand or sit with good posture. Grasp your right / left elbow and draw it across your chest, keeping it at the same height as your shoulder.  Pull your  elbow, so your upper arm comes in closer to your chest. Pull until you feel a gentle stretch in the back of your shoulder.  Hold for __________ seconds. Repeat __________ times. Complete this exercise __________ times per day. STRENGTHENING EXERCISES - Impingement Syndrome (Rotator Cuff Tendinitis, Bursitis) These exercises may help you when beginning to rehabilitate your injury. They may resolve your symptoms with or without further involvement from your physician, physical therapist or athletic trainer. While completing these exercises, remember:  Muscles can gain both the endurance and the strength needed for everyday activities through controlled exercises.  Complete these exercises as instructed by your physician, physical therapist or athletic trainer. Increase the resistance and repetitions only as guided.  You may experience muscle soreness or fatigue, but the pain or discomfort you are trying  to eliminate should never worsen during these exercises. If this pain does get worse, stop and make sure you are following the directions exactly. If the pain is still present after adjustments, discontinue the exercise until you can discuss the trouble with your clinician.  During your recovery, avoid activity or exercises which involve actions that place your injured hand or elbow above your head or behind your back or head. These positions stress the tissues which you are trying to heal. STRENGTH - Scapular Depression and Adduction   With good posture, sit on a firm chair. Support your arms in front of you, with pillows, arm rests, or on a table top. Have your elbows in line with the sides of your body.  Gently draw your shoulder blades down and toward your mid-back spine. Gradually increase the tension, without tensing the muscles along the top of your shoulders and the back of your neck.  Hold for __________ seconds. Slowly release the tension and relax your muscles completely before starting the next repetition.  After you have practiced this exercise, remove the arm support and complete the exercise in standing as well as sitting position. Repeat __________ times. Complete this exercise __________ times per day.  STRENGTH - Shoulder Abductors, Isometric  With good posture, stand or sit about 4-6 inches from a wall, with your right / left side facing the wall.  Bend your right / left elbow. Gently press your right / left elbow into the wall. Increase the pressure gradually, until you are pressing as hard as you can, without shrugging your shoulder or increasing any shoulder discomfort.  Hold for __________ seconds.  Release the tension slowly. Relax your shoulder muscles completely before you begin the next repetition. Repeat __________ times. Complete this exercise __________ times per day.  STRENGTH - External Rotators, Isometric  Keep your right / left elbow at your side and bend it  90 degrees.  Step into a door frame so that the outside of your right / left wrist can press against the door frame without your upper arm leaving your side.  Gently press your right / left wrist into the door frame, as if you were trying to swing the back of your hand away from your stomach. Gradually increase the tension, until you are pressing as hard as you can, without shrugging your shoulder or increasing any shoulder discomfort.  Hold for __________ seconds.  Release the tension slowly. Relax your shoulder muscles completely before you begin the next repetition. Repeat __________ times. Complete this exercise __________ times per day.  STRENGTH - Supraspinatus   Stand or sit with good posture. Grasp a __________ weight, or an exercise band or tubing, so that  your hand is "thumbs-up," like you are shaking hands.  Slowly lift your right / left arm in a "V" away from your thigh, diagonally into the space between your side and straight ahead. Lift your hand to shoulder height or as far as you can, without increasing any shoulder pain. At first, many people do not lift their hands above shoulder height.  Avoid shrugging your right / left shoulder as your arm rises, by keeping your shoulder blade tucked down and toward your mid-back spine.  Hold for __________ seconds. Control the descent of your hand, as you slowly return to your starting position. Repeat __________ times. Complete this exercise __________ times per day.  STRENGTH - External Rotators  Secure a rubber exercise band or tubing to a fixed object (table, pole) so that it is at the same height as your right / left elbow when you are standing or sitting on a firm surface.  Stand or sit so that the secured exercise band is at your uninjured side.  Bend your right / left elbow 90 degrees. Place a folded towel or small pillow under your right / left arm, so that your elbow is a few inches away from your side.  Keeping the tension  on the exercise band, pull it away from your body, as if pivoting on your elbow. Be sure to keep your body steady, so that the movement is coming only from your rotating shoulder.  Hold for __________ seconds. Release the tension in a controlled manner, as you return to the starting position. Repeat __________ times. Complete this exercise __________ times per day.  STRENGTH - Internal Rotators   Secure a rubber exercise band or tubing to a fixed object (table, pole) so that it is at the same height as your right / left elbow when you are standing or sitting on a firm surface.  Stand or sit so that the secured exercise band is at your right / left side.  Bend your elbow 90 degrees. Place a folded towel or small pillow under your right / left arm so that your elbow is a few inches away from your side.  Keeping the tension on the exercise band, pull it across your body, toward your stomach. Be sure to keep your body steady, so that the movement is coming only from your rotating shoulder.  Hold for __________ seconds. Release the tension in a controlled manner, as you return to the starting position. Repeat __________ times. Complete this exercise __________ times per day.  STRENGTH - Scapular Protractors, Standing   Stand arms length away from a wall. Place your hands on the wall, keeping your elbows straight.  Begin by dropping your shoulder blades down and toward your mid-back spine.  To strengthen your protractors, keep your shoulder blades down, but slide them forward on your rib cage. It will feel as if you are lifting the back of your rib cage away from the wall. This is a subtle motion and can be challenging to complete. Ask your caregiver for further instruction, if you are not sure you are doing the exercise correctly.  Hold for __________ seconds. Slowly return to the starting position, resting the muscles completely before starting the next repetition. Repeat __________ times.  Complete this exercise __________ times per day. STRENGTH - Scapular Protractors, Supine  Lie on your back on a firm surface. Extend your right / left arm straight into the air while holding a __________ weight in your hand.  Keeping your head  and back in place, lift your shoulder off the floor.  Hold for __________ seconds. Slowly return to the starting position, and allow your muscles to relax completely before starting the next repetition. Repeat __________ times. Complete this exercise __________ times per day. STRENGTH - Scapular Protractors, Quadruped  Get onto your hands and knees, with your shoulders directly over your hands (or as close as you can be, comfortably).  Keeping your elbows locked, lift the back of your rib cage up into your shoulder blades, so your mid-back rounds out. Keep your neck muscles relaxed.  Hold this position for __________ seconds. Slowly return to the starting position and allow your muscles to relax completely before starting the next repetition. Repeat __________ times. Complete this exercise __________ times per day.  STRENGTH - Scapular Retractors  Secure a rubber exercise band or tubing to a fixed object (table, pole), so that it is at the height of your shoulders when you are either standing, or sitting on a firm armless chair.  With a palm down grip, grasp an end of the band in each hand. Straighten your elbows and lift your hands straight in front of you, at shoulder height. Step back, away from the secured end of the band, until it becomes tense.  Squeezing your shoulder blades together, draw your elbows back toward your sides, as you bend them. Keep your upper arms lifted away from your body throughout the exercise.  Hold for __________ seconds. Slowly ease the tension on the band, as you reverse the directions and return to the starting position. Repeat __________ times. Complete this exercise __________ times per day. STRENGTH - Shoulder  Extensors   Secure a rubber exercise band or tubing to a fixed object (table, pole) so that it is at the height of your shoulders when you are either standing, or sitting on a firm armless chair.  With a thumbs-up grip, grasp an end of the band in each hand. Straighten your elbows and lift your hands straight in front of you, at shoulder height. Step back, away from the secured end of the band, until it becomes tense.  Squeezing your shoulder blades together, pull your hands down to the sides of your thighs. Do not allow your hands to go behind you.  Hold for __________ seconds. Slowly ease the tension on the band, as you reverse the directions and return to the starting position. Repeat __________ times. Complete this exercise __________ times per day.  STRENGTH - Scapular Retractors and External Rotators   Secure a rubber exercise band or tubing to a fixed object (table, pole) so that it is at the height as your shoulders, when you are either standing, or sitting on a firm armless chair.  With a palm down grip, grasp an end of the band in each hand. Bend your elbows 90 degrees and lift your elbows to shoulder height, at your sides. Step back, away from the secured end of the band, until it becomes tense.  Squeezing your shoulder blades together, rotate your shoulders so that your upper arms and elbows remain stationary, but your fists travel upward to head height.  Hold for __________ seconds. Slowly ease the tension on the band, as you reverse the directions and return to the starting position. Repeat __________ times. Complete this exercise __________ times per day.  STRENGTH - Scapular Retractors and External Rotators, Rowing   Secure a rubber exercise band or tubing to a fixed object (table, pole) so that it is at the  height of your shoulders, when you are either standing, or sitting on a firm armless chair.  With a palm down grip, grasp an end of the band in each hand. Straighten your  elbows and lift your hands straight in front of you, at shoulder height. Step back, away from the secured end of the band, until it becomes tense.  Step 1: Squeeze your shoulder blades together. Bending your elbows, draw your hands to your chest, as if you are rowing a boat. At the end of this motion, your hands and elbow should be at shoulder height and your elbows should be out to your sides.  Step 2: Rotate your shoulders, to raise your hands above your head. Your forearms should be vertical and your upper arms should be horizontal.  Hold for __________ seconds. Slowly ease the tension on the band, as you reverse the directions and return to the starting position. Repeat __________ times. Complete this exercise __________ times per day.  STRENGTH - Scapular Depressors  Find a sturdy chair without wheels, such as a dining room chair.  Keeping your feet on the floor, and your hands on the chair arms, lift your bottom up from the seat, and lock your elbows.  Keeping your elbows straight, allow gravity to pull your body weight down. Your shoulders will rise toward your ears.  Raise your body against gravity by drawing your shoulder blades down your back, shortening the distance between your shoulders and ears. Although your feet should always maintain contact with the floor, your feet should progressively support less body weight, as you get stronger.  Hold for __________ seconds. In a controlled and slow manner, lower your body weight to begin the next repetition. Repeat __________ times. Complete this exercise __________ times per day.    This information is not intended to replace advice given to you by your health care provider. Make sure you discuss any questions you have with your health care provider.   Document Released: 08/08/2005 Document Revised: 08/29/2014 Document Reviewed: 11/20/2008 [employer]vier Interactive Patient Education [employer].

## 2015-11-11 LAB — COMPREHENSIVE METABOLIC PANEL
ALT: 11 U/L (ref 9–46)
AST: 13 U/L (ref 10–35)
Albumin: 4.2 g/dL (ref 3.6–5.1)
Alkaline Phosphatase: 56 U/L (ref 40–115)
BUN: 12 mg/dL (ref 7–25)
CO2: 29 mmol/L (ref 20–31)
Calcium: 9.6 mg/dL (ref 8.6–10.3)
Chloride: 96 mmol/L — ABNORMAL LOW (ref 98–110)
Creat: 0.71 mg/dL (ref 0.70–1.33)
Glucose, Bld: 113 mg/dL — ABNORMAL HIGH (ref 65–99)
Potassium: 4.4 mmol/L (ref 3.5–5.3)
Sodium: 141 mmol/L (ref 135–146)
Total Bilirubin: 0.4 mg/dL (ref 0.2–1.2)
Total Protein: 6.8 g/dL (ref 6.1–8.1)

## 2015-11-11 LAB — HEMOGLOBIN A1C
Hgb A1c MFr Bld: 6.3 % — ABNORMAL HIGH (ref <5.7)
Mean Plasma Glucose: 134 mg/dL — ABNORMAL HIGH (ref <117)

## 2015-11-12 ENCOUNTER — Other Ambulatory Visit: Payer: Self-pay | Admitting: Family Medicine

## 2015-11-22 ENCOUNTER — Other Ambulatory Visit: Payer: Self-pay | Admitting: Family Medicine

## 2015-12-01 ENCOUNTER — Encounter: Payer: Self-pay | Admitting: Family Medicine

## 2015-12-24 ENCOUNTER — Other Ambulatory Visit: Payer: Self-pay | Admitting: Specialist

## 2015-12-24 DIAGNOSIS — J8489 Other specified interstitial pulmonary diseases: Secondary | ICD-10-CM

## 2015-12-24 DIAGNOSIS — R9389 Abnormal findings on diagnostic imaging of other specified body structures: Secondary | ICD-10-CM

## 2015-12-31 ENCOUNTER — Ambulatory Visit
Admission: RE | Admit: 2015-12-31 | Discharge: 2015-12-31 | Disposition: A | Discharge status: Home / Self Care | Payer: 59 | Source: Ambulatory Visit | Attending: Specialist | Admitting: Specialist

## 2015-12-31 DIAGNOSIS — J8489 Other specified interstitial pulmonary diseases: Secondary | ICD-10-CM | POA: Diagnosis not present

## 2015-12-31 DIAGNOSIS — R9389 Abnormal findings on diagnostic imaging of other specified body structures: Secondary | ICD-10-CM

## 2016-02-09 ENCOUNTER — Other Ambulatory Visit: Payer: Self-pay | Admitting: Family Medicine

## 2016-02-16 ENCOUNTER — Ambulatory Visit: Payer: 59 | Admitting: Family Medicine

## 2016-02-24 ENCOUNTER — Ambulatory Visit: Payer: 59 | Admitting: Family Medicine

## 2016-04-23 ENCOUNTER — Other Ambulatory Visit: Payer: Self-pay | Admitting: Family Medicine

## 2016-05-20 ENCOUNTER — Other Ambulatory Visit: Payer: Self-pay | Admitting: Family Medicine

## 2016-05-21 NOTE — Telephone Encounter (Signed)
30 day supply sent.  Pt needs OV with Dr. Tamala Julian within 30 days.

## 2016-05-21 NOTE — Telephone Encounter (Signed)
PT NEEDS OFFICE VISIT IN NEXT 30 DAYS BEFORE NEED FOR REFILL

## 2016-05-22 ENCOUNTER — Other Ambulatory Visit: Payer: Self-pay | Admitting: Family Medicine

## 2016-05-24 ENCOUNTER — Other Ambulatory Visit: Payer: Self-pay | Admitting: Family Medicine

## 2016-05-24 NOTE — Telephone Encounter (Signed)
Last exam and labs 10/2015

## 2016-06-24 ENCOUNTER — Other Ambulatory Visit: Payer: Self-pay | Admitting: Family Medicine

## 2016-06-27 ENCOUNTER — Telehealth: Payer: Self-pay

## 2016-06-27 NOTE — Telephone Encounter (Signed)
Patient's wife is requesting a medication refill for her husband.  He is running out of duloxetine and diltiazem.  Please advise with further instruction.  [phone]

## 2016-06-28 MED ORDER — SERTRALINE HCL 100 MG PO TABS: 200.0000 mg | ORAL_TABLET | Freq: Every day | ORAL | 0 refills | Status: DC

## 2016-06-28 NOTE — Telephone Encounter (Signed)
Kirk Robinson a Software engineer from [place] is calling for pt. Pt was denied a refill on his sertraline (ZOLOFT) 100 MG tablet [id number] because he needs an OV. Arvo has one scheduled for 07/12/16, so Nicki Reaper would like to know if patient can get a 14 day supply of his sertraline (ZOLOFT) 100 MG tablet [id number] [id number] [place], [place].

## 2016-06-28 NOTE — Telephone Encounter (Signed)
Pts wife called back. States that he is out of this medication now, he takes it to prevent seizures, needs a 2 week refill to cover him until his next apt with our provider. I stated that it takes 2-3 business days for refills. Contact number is listed below.

## 2016-06-28 NOTE — Telephone Encounter (Signed)
I approved refill of Sertraline until scheduled appointment on 11/21.

## 2016-06-28 NOTE — Telephone Encounter (Signed)
Dr. Tamala Julian,,  Are you okay with a 14 day refill until scheduled appointment 11/21? Please advise

## 2016-07-12 ENCOUNTER — Ambulatory Visit (INDEPENDENT_AMBULATORY_CARE_PROVIDER_SITE_OTHER): Payer: 59

## 2016-07-12 ENCOUNTER — Encounter: Payer: Self-pay | Admitting: Family Medicine

## 2016-07-12 ENCOUNTER — Ambulatory Visit (INDEPENDENT_AMBULATORY_CARE_PROVIDER_SITE_OTHER): Payer: 59 | Admitting: Family Medicine

## 2016-07-12 VITALS — BP 110/74 | HR 76 | Temp 97.7°F | Resp 18 | Ht 68.75 in | Wt 146.8 lb

## 2016-07-12 DIAGNOSIS — F324 Major depressive disorder, single episode, in partial remission: Secondary | ICD-10-CM

## 2016-07-12 DIAGNOSIS — M25441 Effusion, right hand: Secondary | ICD-10-CM | POA: Diagnosis not present

## 2016-07-12 DIAGNOSIS — J441 Chronic obstructive pulmonary disease with (acute) exacerbation: Secondary | ICD-10-CM

## 2016-07-12 DIAGNOSIS — J8410 Pulmonary fibrosis, unspecified: Secondary | ICD-10-CM

## 2016-07-12 DIAGNOSIS — I739 Peripheral vascular disease, unspecified: Secondary | ICD-10-CM

## 2016-07-12 DIAGNOSIS — T8509XD Other mechanical complication of ventricular intracranial (communicating) shunt, subsequent encounter: Secondary | ICD-10-CM

## 2016-07-12 DIAGNOSIS — E7800 Pure hypercholesterolemia, unspecified: Secondary | ICD-10-CM | POA: Diagnosis not present

## 2016-07-12 DIAGNOSIS — G894 Chronic pain syndrome: Secondary | ICD-10-CM

## 2016-07-12 DIAGNOSIS — E119 Type 2 diabetes mellitus without complications: Secondary | ICD-10-CM | POA: Diagnosis not present

## 2016-07-12 DIAGNOSIS — E890 Postprocedural hypothyroidism: Secondary | ICD-10-CM

## 2016-07-12 DIAGNOSIS — K219 Gastro-esophageal reflux disease without esophagitis: Secondary | ICD-10-CM

## 2016-07-12 DIAGNOSIS — J411 Mucopurulent chronic bronchitis: Secondary | ICD-10-CM | POA: Diagnosis not present

## 2016-07-12 LAB — COMPREHENSIVE METABOLIC PANEL
ALT: 11 U/L (ref 9–46)
AST: 14 U/L (ref 10–35)
Albumin: 4.1 g/dL (ref 3.6–5.1)
Alkaline Phosphatase: 61 U/L (ref 40–115)
BUN: 15 mg/dL (ref 7–25)
CO2: 27 mmol/L (ref 20–31)
Calcium: 9 mg/dL (ref 8.6–10.3)
Chloride: 100 mmol/L (ref 98–110)
Creat: 0.73 mg/dL (ref 0.70–1.33)
Glucose, Bld: 114 mg/dL — ABNORMAL HIGH (ref 65–99)
Potassium: 4 mmol/L (ref 3.5–5.3)
Sodium: 139 mmol/L (ref 135–146)
Total Bilirubin: 0.5 mg/dL (ref 0.2–1.2)
Total Protein: 6.8 g/dL (ref 6.1–8.1)

## 2016-07-12 LAB — POCT URINALYSIS DIP (MANUAL ENTRY)
Bilirubin, UA: NEGATIVE
Glucose, UA: NEGATIVE
Ketones, POC UA: NEGATIVE
Leukocytes, UA: NEGATIVE
Nitrite, UA: NEGATIVE
Protein Ur, POC: NEGATIVE
Spec Grav, UA: <=1.005
Urobilinogen, UA: 0.2
pH, UA: 6.5

## 2016-07-12 LAB — CBC WITH DIFFERENTIAL/PLATELET
Basophils Absolute: 0 cells/uL (ref 0–200)
Basophils Relative: 0 %
Eosinophils Absolute: 0 cells/uL — ABNORMAL LOW (ref 15–500)
Eosinophils Relative: 0 %
HCT: 39.1 % (ref 38.5–50.0)
Hemoglobin: 13.1 g/dL — ABNORMAL LOW (ref 13.2–17.1)
Lymphocytes Relative: 9 %
Lymphs Abs: 1035 cells/uL (ref 850–3900)
MCH: 29.5 pg (ref 27.0–33.0)
MCHC: 33.5 g/dL (ref 32.0–36.0)
MCV: 88.1 fL (ref 80.0–100.0)
MPV: 10.1 fL (ref 7.5–12.5)
Monocytes Absolute: 345 cells/uL (ref 200–950)
Monocytes Relative: 3 %
Neutro Abs: 10120 cells/uL — ABNORMAL HIGH (ref 1500–7800)
Neutrophils Relative %: 88 %
Platelets: 286 10*3/uL (ref 140–400)
RBC: 4.44 MIL/uL (ref 4.20–5.80)
RDW: 14.4 % (ref 11.0–15.0)
WBC: 11.5 10*3/uL — ABNORMAL HIGH (ref 3.8–10.8)

## 2016-07-12 LAB — LIPID PANEL
Cholesterol: 167 mg/dL (ref <200)
HDL: 53 mg/dL (ref >40)
LDL Cholesterol: 91 mg/dL (ref <100)
Total CHOL/HDL Ratio: 3.2 Ratio (ref <5.0)
Triglycerides: 117 mg/dL (ref <150)
VLDL: 23 mg/dL (ref <30)

## 2016-07-12 MED ORDER — SERTRALINE HCL 100 MG PO TABS: 200.0000 mg | ORAL_TABLET | Freq: Every day | ORAL | 1 refills | Status: DC

## 2016-07-12 MED ORDER — DILTIAZEM HCL ER COATED BEADS 120 MG PO CP24: ORAL_CAPSULE | ORAL | 3 refills | Status: DC

## 2016-07-12 MED ORDER — OMEPRAZOLE 20 MG PO CPDR: DELAYED_RELEASE_CAPSULE | ORAL | 3 refills | Status: DC

## 2016-07-12 MED ORDER — ATORVASTATIN CALCIUM 20 MG PO TABS: 20.0000 mg | ORAL_TABLET | Freq: Every day | ORAL | 3 refills | Status: DC

## 2016-07-12 MED ORDER — METFORMIN HCL 1000 MG PO TABS: 1000.0000 mg | ORAL_TABLET | Freq: Two times a day (BID) | ORAL | 3 refills | Status: DC

## 2016-07-12 MED ORDER — IPRATROPIUM BROMIDE 0.02 % IN SOLN
0.5000 mg | Freq: Once | RESPIRATORY_TRACT | Status: AC
  Administered 2016-07-12: 0.5 mg via RESPIRATORY_TRACT

## 2016-07-12 MED ORDER — ALBUTEROL SULFATE (2.5 MG/3ML) 0.083% IN NEBU
2.5000 mg | INHALATION_SOLUTION | Freq: Once | RESPIRATORY_TRACT | Status: AC
  Administered 2016-07-12: 2.5 mg via RESPIRATORY_TRACT

## 2016-07-12 NOTE — Patient Instructions (Signed)
     IF you received an x-ray today, you will receive an invoice from [facility]. Please contact [facility] at [phone] with questions or concerns regarding your invoice.   IF you received labwork today, you will receive an invoice from [facility]. Please contact [facility] at [phone] with questions or concerns regarding your invoice.   Our billing staff will not be able to assist you with questions regarding bills from these companies.  You will be contacted with the lab results as soon as they are available. The fastest way to get your results is to activate your My Chart account. Instructions are located on the last page of this paperwork. If you have not heard from us regarding the results in 2 weeks, please contact this office.      

## 2016-07-12 NOTE — Progress Notes (Signed)
Subjective:    Patient ID: Kirk Robinson, male    DOB: [birth year], 60 y.o.   MRN: [id number]  07/12/2016  Follow-up   HPI This 60 y.o. male presents for evaluation of DMII, hypercholesterolemia, depression, GERD.  At last visit in 10/2015 added Lexapro 10mg  daily; never started Lexapro at last visit; gets overwhelmed easily.  Not checking sugars; has supplies but not checking.  Wife also taking care of parents; wife not keeping up; pt not interested in checking sugar self.  Forgets second dose frequently. Has nausea with Metformin.  Suffers with a lot of nausea even when does not take Metformin.  Sertraline 200mg  daily.  Did not have a good experience with Cymbalta; afraid to add Lexapro.  Feels worse than at last visit.  Recent eye exam; glasses; no evidence of diabetic retinopathy.  Last visit two months ago.  Still trying to get lens correct. Left eye is deviating down.  Does not feel like doing anything most of the time.  Not sure why does not feel like doing anything; too tired to mess with stuff. Hurt hands a lot.  Bleeds a lot; takes forever to heal.    Just recovered from acute bronchitis.  Treated with bronchitis.  Feeling better. Still congested a bit.  Treated with Amoxicillin, Cepacol lozenges, flu vaccine on 06/27/16.  Added breo.  Taking Prednisone 15mg  daily. Spiriva daily.  Coughing sputum in morning small amount.  SOB with exertion/activity.  Only using nebulizer 0-1 times per day at baseline and with acute illness.  Dr. Hardin Negus concerned with B second and third MCP joints swollen and painful.  Arthritis runs in family.  Brother with OA with surgery on hands.  No rheumatoid arthritis.  Brother disabled from osteoarthritis hands.  No xrays performed by Dr. Hardin Negus.    BP Readings from Last 3 Encounters:  07/12/16 110/74  11/10/15 112/69  07/31/15 103/60   Wt Readings from Last 3 Encounters:  07/12/16 146 lb 12.8 oz (66.6 kg)  11/10/15 149 lb (67.6 kg)  07/31/15 152 lb 6.4  oz (69.1 kg)   Immunization History  Administered Date(s) Administered  . Influenza Split 06/20/2012  . Influenza,inj,Quad PF,36+ Mos 05/14/2014, 04/24/2015  . Influenza-Unspecified 06/28/2016  . Pneumococcal Polysaccharide-23 04/24/2015    Review of Systems  Constitutional: Negative for activity change, appetite change, chills, diaphoresis, fatigue and fever.  Eyes: Negative for visual disturbance.  Respiratory: Positive for cough, shortness of breath and wheezing.   Cardiovascular: Negative for chest pain, palpitations and leg swelling.  Endocrine: Negative for cold intolerance, heat intolerance, polydipsia, polyphagia and polyuria.  Musculoskeletal: Positive for arthralgias, back pain, joint swelling, neck pain and neck stiffness.  Neurological: Negative for dizziness, tremors, seizures, syncope, facial asymmetry, speech difficulty, weakness, light-headedness, numbness and headaches.  Psychiatric/Behavioral: Positive for decreased concentration and dysphoric mood. Negative for self-injury and sleep disturbance. The patient is nervous/anxious.     Past Medical History:  Diagnosis Date  . Alcohol use 03/16/2007   patient risk factors  . Allergic rhinitis, cause unspecified   . Altered mental status   . Anxiety state, unspecified   . Cervicalgia 09/17/2007  . COPD (chronic obstructive pulmonary disease) ([place])   . Diabetes mellitus without complication ([place])   . Diplopia 12/21/2007  . Esophageal reflux   . Gastritis 05/13/2009  . HCAP (healthcare-associated pneumonia) 08/12/2014  . Hematuria 03/16/2007  . Hyperthyroidism    graves disease  . Iodine hypothyroidism   . Memory loss   . NSIP (nonspecific interstitial pneumonia) (  [place])    on azathioprine  . Osteoporosis, unspecified    bone density per [facility] in 2012  . Other abnormal glucose 04/08/2008  . Other diseases of lung, not elsewhere classified 01/23/2009   s/p [place]/pulmonology consult; intolerant  to Advair  .  Peripheral vascular disease, unspecified 12/24/2009  . Personal history of colonic polyps   . Pneumonia, organism unspecified(486)   . Pure hypercholesterolemia   . Shortness of breath   . Stroke ([place]) 2005  . Tobacco use disorder 03/16/2007   patient risk factors  . Unspecified hypothyroidism 01/07/2010  . 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 Admission 11/2011     altered mental status/confusion with syncope.  CT head negative, MRI brain negative, EEG: diffuse slowing but no  seizure acitivity.  Labs negative.  [facility].  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 EYE SURGERY  2010  . LAPAROSCOPIC REVISION VENTRICULAR-PERITONEAL (V-P) SHUNT Right 05/20/2014   Procedure: LAPAROSCOPIC REVISION VENTRICULAR-PERITONEAL (V-P) SHUNT;  Surgeon: Georganna Skeans, MD;  Location: [facility];  Service: General;  Laterality: Right;  . Neuropsychiatric evaluation  04/2012   poor short-term memory, poor recall, delayed reaction time; permanently disabled.     Allergies  Allergen Reactions  . Advair Diskus [Fluticasone-Salmeterol] Shortness Of Breath  . Erythromycin Nausea And Vomiting  . Iodinated Diagnostic Agents Shortness Of Breath  . Ativan [Lorazepam] Other (See Comments)    Caused pt's heart to stop  . Darvocet [Propoxyphene N-Acetaminophen] Nausea And Vomiting  . Doxycycline Hyclate Nausea And Vomiting, Swelling and Other (See Comments)    Tongue swelling, severe depression  . Septra [Sulfamethoxazole-Trimethoprim] Rash    Social History   Social History  . Marital status: Married    Spouse name: N/A  . Number of children: 2  . Years of education: N/A   Occupational History  .      works full time 40 hrs   Social History Main Topics  . Smoking status: Current Every Day Smoker    Packs/day: 1.00    Years: 30.00  . Smokeless tobacco: Never Used  . Alcohol use No  . Drug use: No  . Sexual  activity: No   Other Topics Concern  . Not on file   Social History Narrative   Marital status:  Married x 35 years.       Children:  Children ages 71, 32.   2 grandchildren (89, 3).      Lives: with wife.     Employment: disability since 2013.       Tobacco:  1/2 ppd x 40 years.      Alcohol: rarely.      Exercise: walking the dogs daily.   Caffeine use: Carbonated beverages, Pepsi: Coffee, many servings a day Pepsi 3-4  Coffee 2 cups. Smoke detectors in home. Always wears seatbelts. Exercise: Inactive. No guns in the home.   Family History  Problem Relation Age of Onset  . Heart disease Father     cad, chf  . Cancer Father   . Heart disease Brother 64    CABG  . Cancer Brother 60    pancreatic cancer  . Diabetes Brother   . Diabetes Brother   . Heart disease Brother 84    CABG  . Diabetes Brother   . Heart disease Brother 11    CABG  . Cancer Brother 50    prostate cancer  .  Heart disease Mother     cad  . Diabetes Mother   . Stroke Mother        Objective:    BP 110/74   Pulse 76   Temp 97.7 F (36.5 C) (Oral)   Resp 18   Ht 5' 8.75" (1.746 m)   Wt 146 lb 12.8 oz (66.6 kg)   SpO2 92%   BMI 21.84 kg/m  Physical Exam  Constitutional: He is oriented to person, place, and time. He appears well-developed and well-nourished. No distress.  HENT:  Head: Normocephalic and atraumatic.  Right Ear: External ear normal.  Left Ear: External ear normal.  Nose: Nose normal.  Mouth/Throat: Oropharynx is clear and moist.  Eyes: Conjunctivae and EOM are normal.  Neck: Normal range of motion. Neck supple. Carotid bruit is not present. No thyromegaly present.  Cardiovascular: Normal rate, regular rhythm, normal heart sounds and intact distal pulses.  Exam reveals no gallop and no friction rub.   No murmur heard. Pulmonary/Chest: Effort normal. No respiratory distress. He has wheezes. He has no rales.  Abdominal: Soft. Bowel sounds are normal. He exhibits no distension  and no mass. There is no tenderness. There is no rebound and no guarding.  Musculoskeletal:  B hand 2nd and 3rd MCP joint swelling.  Lymphadenopathy:    He has no cervical adenopathy.  Neurological: He is alert and oriented to person, place, and time. No cranial nerve deficit.  Skin: Skin is warm and dry. No rash noted. He is not diaphoretic.  Psychiatric: He has a normal mood and affect. His behavior is normal.  Nursing note and vitals reviewed.  Results for orders placed or performed in visit on 07/12/16  POCT urinalysis dipstick  Result Value Ref Range   Color, UA yellow yellow   Clarity, UA clear clear   Glucose, UA negative negative   Bilirubin, UA negative negative   Ketones, POC UA negative negative   Spec Grav, UA <=1.005    Blood, UA trace-intact (A) negative   pH, UA 6.5    Protein Ur, POC negative negative   Urobilinogen, UA 0.2    Nitrite, UA Negative Negative   Leukocytes, UA Negative Negative   Fall Risk  07/12/2016 11/10/2015 04/17/2015 10/22/2014 02/26/2014  Falls in the past year? No Yes No Yes Yes  Number falls in past yr: - 2 or more - 2 or more -  Risk Factor Category  - - - High Fall Risk -   Depression screen [facility] 2/9 07/12/2016 11/10/2015 04/17/2015 10/22/2014 02/26/2014  Decreased Interest 0 3 1 1  0  Down, Depressed, Hopeless 0 3 0 1 (No Data)  PHQ - 2 Score 0 6 1 2  0  Altered sleeping - 1 - 1 -  Tired, decreased energy - 2 - 1 -  Change in appetite - 0 - 1 -  Feeling bad or failure about yourself  - 1 - 1 -  Trouble concentrating - 1 - 0 -  Moving slowly or fidgety/restless - 0 - 0 -  Suicidal thoughts - 0 - 0 -  PHQ-9 Score - 11 - 6 -       Assessment & Plan:   1. Type 2 diabetes mellitus without complication, without long-term current use of insulin ([place])   2. Peripheral vascular disease ([place])   3. Mucopurulent chronic bronchitis ([place])   4. Pulmonary fibrosis ([place])   5. Gastroesophageal reflux disease without esophagitis   6. Hypothyroidism following  radioiodine therapy   7.  Obstructed ventriculoperitoneal shunt, subsequent encounter   8. Pure hypercholesterolemia   9. Major depressive disorder with single episode, in partial remission ([place])   10. Chronic pain syndrome   11. Swelling of joint of right hand   12. COPD exacerbation ([place])    -acute COPD exacerbation; s/p nebulizer in office. -obtain hand xray L due to MCP swelling of second and third digits; obtain autoimmune labs.  -obtain labs for diabetes; continue current medications. -recommend trial of adding Lexapro and decreasing Zoloft to 150mg  daily.  Depression poorly controlled yet patient very reluctant to try different medication.   Orders Placed This Encounter  Procedures  . DG Hand Complete Left    Standing Status:   Future    Standing Expiration Date:   07/12/2017    Order Specific Question:   Reason for Exam (SYMPTOM  OR DIAGNOSIS REQUIRED)    Answer:   pain in L MCP second and third    Order Specific Question:   Preferred imaging location?    Answer:   External  . CBC with Differential/Platelet  . Comprehensive metabolic panel    Order Specific Question:   Has the patient fasted?    Answer:   Yes  . Lipid panel    Order Specific Question:   Has the patient fasted?    Answer:   Yes  . Hemoglobin A1c  . Microalbumin, urine  . ANA  . Rheumatoid factor  . POCT urinalysis dipstick   Meds ordered this encounter  Medications  . albuterol (PROVENTIL) (2.5 MG/3ML) 0.083% nebulizer solution 2.5 mg  . ipratropium (ATROVENT) nebulizer solution 0.5 mg  . atorvastatin (LIPITOR) 20 MG tablet    Sig: Take 1 tablet (20 mg total) by mouth daily at 6 PM.    Dispense:  90 tablet    Refill:  3  . diltiazem (CARDIZEM CD) 120 MG 24 hr capsule    Sig: TAKE 1 CAPSULE (120 MG TOTAL) BY MOUTH DAILY.    Dispense:  90 capsule    Refill:  3  . metFORMIN (GLUCOPHAGE) 1000 MG tablet    Sig: Take 1 tablet (1,000 mg total) by mouth 2 (two) times daily with a meal.    Dispense:   180 tablet    Refill:  3  . omeprazole (PRILOSEC) 20 MG capsule    Sig: TAKE 1 CAPSULE (20 MG TOTAL) BY MOUTH DAILY.    Dispense:  90 capsule    Refill:  3  . sertraline (ZOLOFT) 100 MG tablet    Sig: Take 2 tablets (200 mg total) by mouth daily.    Dispense:  180 tablet    Refill:  1    Return in about 3 months (around 10/12/2016) for recheck.   Kristi Elayne Guerin, M.D. [facility] [address] [place], [place]  [address] [phone] phone [phone] fax

## 2016-07-13 ENCOUNTER — Telehealth: Payer: Self-pay

## 2016-07-13 LAB — RHEUMATOID FACTOR: Rhuematoid fact SerPl-aCnc: <14 IU/mL (ref <14)

## 2016-07-13 LAB — HEMOGLOBIN A1C
Hgb A1c MFr Bld: 6.2 % — ABNORMAL HIGH (ref <5.7)
Mean Plasma Glucose: 131 mg/dL

## 2016-07-13 LAB — MICROALBUMIN, URINE: Microalb, Ur: 0.2 mg/dL

## 2016-07-13 LAB — ANA: Anti Nuclear Antibody(ANA): NEGATIVE

## 2016-07-13 NOTE — Telephone Encounter (Signed)
Patient was here yesterday 11/21 and has picked up a fever since he started the breathing treatment for his wheezing. Patient's spouse would like to know if we can send something in. Please advise! [phone] [facility] [place]

## 2016-07-19 NOTE — Telephone Encounter (Signed)
Pt states fever is resolved with slight wheezing Denies cough, sob or chest tightness Advised to please return to clinic

## 2016-09-07 ENCOUNTER — Other Ambulatory Visit: Payer: Self-pay | Admitting: Family Medicine

## 2016-10-07 ENCOUNTER — Other Ambulatory Visit: Payer: Self-pay | Admitting: Family Medicine

## 2016-10-18 ENCOUNTER — Ambulatory Visit: Payer: 59 | Admitting: Family Medicine

## 2016-11-11 ENCOUNTER — Encounter: Payer: Self-pay | Admitting: Family Medicine

## 2016-12-04 ENCOUNTER — Other Ambulatory Visit: Payer: Self-pay | Admitting: Family Medicine

## 2016-12-05 ENCOUNTER — Telehealth: Payer: Self-pay | Admitting: Family Medicine

## 2016-12-05 NOTE — Telephone Encounter (Signed)
LMOM TO SCHEDULE AN APPOINTMENT WITH DR Tamala Julian IN ABOUT A MONTH FOR A REFILL ON LEXAPRO HE CANCELLED HIS APPOINTMENT THAT HE HAD IN FEB AND NEED TO FOLLOW BACK UP ON MEDICATION

## 2016-12-05 NOTE — Telephone Encounter (Signed)
Call --- I received a refill request for Lexapro for patient.  He NO SHOWED for his February appointment; please reschedule an appointment with me in the upcoming month.

## 2017-01-07 ENCOUNTER — Other Ambulatory Visit: Payer: Self-pay | Admitting: Family Medicine

## 2017-01-07 NOTE — Telephone Encounter (Signed)
Call wife --- I approved a one month refill of Metformin as patient is due for six month follow-up of DMII and depression.  Please schedule OV with me this month.

## 2017-02-05 ENCOUNTER — Telehealth: Payer: Self-pay | Admitting: Family Medicine

## 2017-02-05 NOTE — Telephone Encounter (Signed)
Call wife (patient with memory loss) --- I denied refill of Metformin as patient is overdue for follow-up.  Please schedule OV with me in upcoming 2 weeks.

## 2017-02-07 NOTE — Telephone Encounter (Signed)
PATIENT'S WIFE (LINDA) WOULD LIKE DR. Tamala Julian TO KNOW THAT THERE IS A CONFLICT BETWEEN SCHEDULES. DR. Tamala Julian IS BOOKED UNTIL THE PATIENT GOES OUT OF TOWN ON 02/17/17. THEY WILL NOT BE BACK UNTIL 02/26/17. SHE WOULD LIKE TO GET A REFILL ON HIS METFORMIN AND LEXAPRO 10 MG. HE HAS SCHEDULED AN APPOINTMENT ON 03/01/17. BEST PHONE [phone] (WIFE IS LINDA) [facility] [place] [place]

## 2017-02-08 ENCOUNTER — Other Ambulatory Visit: Payer: Self-pay | Admitting: Emergency Medicine

## 2017-02-08 MED ORDER — METFORMIN HCL 1000 MG PO TABS: 1000.0000 mg | ORAL_TABLET | Freq: Two times a day (BID) | ORAL | 0 refills | Status: DC

## 2017-02-08 MED ORDER — ESCITALOPRAM OXALATE 10 MG PO TABS: 10.0000 mg | ORAL_TABLET | Freq: Every day | ORAL | 0 refills | Status: DC

## 2017-02-08 NOTE — Telephone Encounter (Signed)
30 day sullpy refill given on Metformin until seen in the office 03/01/17 Advised wife, we can not refill Lexapro until scheduled visit Verbalized understanding

## 2017-02-08 NOTE — Telephone Encounter (Signed)
Noted. Refill of Lexapro provided to make it until appointment on 03/01/17.

## 2017-03-01 ENCOUNTER — Encounter: Payer: Self-pay | Admitting: Family Medicine

## 2017-03-01 ENCOUNTER — Ambulatory Visit (INDEPENDENT_AMBULATORY_CARE_PROVIDER_SITE_OTHER): Payer: 59 | Admitting: Family Medicine

## 2017-03-01 VITALS — BP 117/68 | HR 78 | Temp 98.0°F | Resp 18 | Ht 69.29 in | Wt 147.0 lb

## 2017-03-01 DIAGNOSIS — I480 Paroxysmal atrial fibrillation: Secondary | ICD-10-CM

## 2017-03-01 DIAGNOSIS — J8410 Pulmonary fibrosis, unspecified: Secondary | ICD-10-CM

## 2017-03-01 DIAGNOSIS — M25541 Pain in joints of right hand: Secondary | ICD-10-CM | POA: Diagnosis not present

## 2017-03-01 DIAGNOSIS — G894 Chronic pain syndrome: Secondary | ICD-10-CM | POA: Diagnosis not present

## 2017-03-01 DIAGNOSIS — E119 Type 2 diabetes mellitus without complications: Secondary | ICD-10-CM

## 2017-03-01 DIAGNOSIS — E890 Postprocedural hypothyroidism: Secondary | ICD-10-CM

## 2017-03-01 DIAGNOSIS — F419 Anxiety disorder, unspecified: Secondary | ICD-10-CM

## 2017-03-01 DIAGNOSIS — Z125 Encounter for screening for malignant neoplasm of prostate: Secondary | ICD-10-CM

## 2017-03-01 DIAGNOSIS — E7800 Pure hypercholesterolemia, unspecified: Secondary | ICD-10-CM | POA: Diagnosis not present

## 2017-03-01 DIAGNOSIS — Z720 Tobacco use: Secondary | ICD-10-CM | POA: Diagnosis not present

## 2017-03-01 DIAGNOSIS — F32A Depression, unspecified: Secondary | ICD-10-CM

## 2017-03-01 DIAGNOSIS — M25542 Pain in joints of left hand: Secondary | ICD-10-CM

## 2017-03-01 DIAGNOSIS — J411 Mucopurulent chronic bronchitis: Secondary | ICD-10-CM

## 2017-03-01 DIAGNOSIS — J189 Pneumonia, unspecified organism: Secondary | ICD-10-CM

## 2017-03-01 DIAGNOSIS — F329 Major depressive disorder, single episode, unspecified: Secondary | ICD-10-CM

## 2017-03-01 LAB — POCT URINALYSIS DIP (MANUAL ENTRY)
Glucose, UA: NEGATIVE mg/dL
Leukocytes, UA: NEGATIVE
Nitrite, UA: NEGATIVE
Protein Ur, POC: 30 mg/dL — AB
Spec Grav, UA: 1.02 (ref 1.010–1.025)
Urobilinogen, UA: 1 E.U./dL
pH, UA: 7 (ref 5.0–8.0)

## 2017-03-01 LAB — POCT GLYCOSYLATED HEMOGLOBIN (HGB A1C): Hemoglobin A1C: 6

## 2017-03-01 LAB — GLUCOSE, POCT (MANUAL RESULT ENTRY): POC Glucose: 75 mg/dl (ref 70–99)

## 2017-03-01 MED ORDER — ATORVASTATIN CALCIUM 20 MG PO TABS: 20.0000 mg | ORAL_TABLET | Freq: Every day | ORAL | 1 refills | Status: DC

## 2017-03-01 MED ORDER — OMEPRAZOLE 20 MG PO CPDR: DELAYED_RELEASE_CAPSULE | ORAL | 3 refills | Status: DC

## 2017-03-01 MED ORDER — ESCITALOPRAM OXALATE 20 MG PO TABS: 20.0000 mg | ORAL_TABLET | Freq: Every day | ORAL | 1 refills | Status: DC

## 2017-03-01 MED ORDER — METFORMIN HCL 1000 MG PO TABS: 1000.0000 mg | ORAL_TABLET | Freq: Two times a day (BID) | ORAL | 1 refills | Status: DC

## 2017-03-01 MED ORDER — DILTIAZEM HCL ER COATED BEADS 120 MG PO CP24: ORAL_CAPSULE | ORAL | 1 refills | Status: DC

## 2017-03-01 NOTE — Patient Instructions (Signed)
     IF you received an x-ray today, you will receive an invoice from [facility]. Please contact [facility] at [phone] with questions or concerns regarding your invoice.   IF you received labwork today, you will receive an invoice from [facility]. Please contact [facility] at [phone] with questions or concerns regarding your invoice.   Our billing staff will not be able to assist you with questions regarding bills from these companies.  You will be contacted with the lab results as soon as they are available. The fastest way to get your results is to activate your My Chart account. Instructions are located on the last page of this paperwork. If you have not heard from us regarding the results in 2 weeks, please contact this office.     
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
## 2017-03-02 LAB — COMPREHENSIVE METABOLIC PANEL
ALT: 12 IU/L (ref 0–44)
AST: 14 IU/L (ref 0–40)
Albumin/Globulin Ratio: 1.5 (ref 1.2–2.2)
Albumin: 4.1 g/dL (ref 3.5–5.5)
Alkaline Phosphatase: 67 IU/L (ref 39–117)
BUN/Creatinine Ratio: 14 (ref 9–20)
BUN: 13 mg/dL (ref 6–24)
Bilirubin Total: 0.3 mg/dL (ref 0.0–1.2)
CO2: 27 mmol/L (ref 20–29)
Calcium: 10 mg/dL (ref 8.7–10.2)
Chloride: 97 mmol/L (ref 96–106)
Creatinine, Ser: 0.91 mg/dL (ref 0.76–1.27)
GFR calc Af Amer: 107 mL/min/{1.73_m2} (ref >59)
GFR calc non Af Amer: 93 mL/min/{1.73_m2} (ref >59)
Globulin, Total: 2.7 g/dL (ref 1.5–4.5)
Glucose: 86 mg/dL (ref 65–99)
Potassium: 4.2 mmol/L (ref 3.5–5.2)
Sodium: 141 mmol/L (ref 134–144)
Total Protein: 6.8 g/dL (ref 6.0–8.5)

## 2017-03-02 LAB — CBC WITH DIFFERENTIAL/PLATELET
Basophils Absolute: 0 10*3/uL (ref 0.0–0.2)
Basos: 0 %
EOS (ABSOLUTE): 0.4 10*3/uL (ref 0.0–0.4)
Eos: 4 %
Hematocrit: 41 % (ref 37.5–51.0)
Hemoglobin: 13.7 g/dL (ref 13.0–17.7)
Immature Grans (Abs): 0 10*3/uL (ref 0.0–0.1)
Immature Granulocytes: 0 %
Lymphocytes Absolute: 2.3 10*3/uL (ref 0.7–3.1)
Lymphs: 25 %
MCH: 29.7 pg (ref 26.6–33.0)
MCHC: 33.4 g/dL (ref 31.5–35.7)
MCV: 89 fL (ref 79–97)
Monocytes Absolute: 0.6 10*3/uL (ref 0.1–0.9)
Monocytes: 6 %
Neutrophils Absolute: 5.9 10*3/uL (ref 1.4–7.0)
Neutrophils: 65 %
Platelets: 324 10*3/uL (ref 150–379)
RBC: 4.62 x10E6/uL (ref 4.14–5.80)
RDW: 14.8 % (ref 12.3–15.4)
WBC: 9.2 10*3/uL (ref 3.4–10.8)

## 2017-03-02 LAB — TRANSFERRIN: Transferrin: 236 mg/dL (ref 200–370)

## 2017-03-02 LAB — LIPID PANEL
Chol/HDL Ratio: 3.2 ratio (ref 0.0–5.0)
Cholesterol, Total: 159 mg/dL (ref 100–199)
HDL: 49 mg/dL (ref >39)
LDL Calculated: 85 mg/dL (ref 0–99)
Triglycerides: 123 mg/dL (ref 0–149)
VLDL Cholesterol Cal: 25 mg/dL (ref 5–40)

## 2017-03-02 LAB — PSA: Prostate Specific Ag, Serum: 0.8 ng/mL (ref 0.0–4.0)

## 2017-03-02 LAB — VITAMIN B12: Vitamin B-12: 555 pg/mL (ref 232–1245)

## 2017-03-02 LAB — FERRITIN: Ferritin: 44 ng/mL (ref 30–400)

## 2017-03-02 LAB — VITAMIN D 25 HYDROXY (VIT D DEFICIENCY, FRACTURES): Vit D, 25-Hydroxy: 48.4 ng/mL (ref 30.0–100.0)

## 2017-03-03 ENCOUNTER — Other Ambulatory Visit: Payer: Self-pay | Admitting: Family Medicine

## 2017-03-05 ENCOUNTER — Other Ambulatory Visit: Payer: Self-pay | Admitting: Family Medicine

## 2017-03-19 DIAGNOSIS — F32A Depression, unspecified: Secondary | ICD-10-CM | POA: Insufficient documentation

## 2017-03-19 DIAGNOSIS — R413 Other amnesia: Secondary | ICD-10-CM | POA: Insufficient documentation

## 2017-03-19 DIAGNOSIS — F419 Anxiety disorder, unspecified: Secondary | ICD-10-CM | POA: Insufficient documentation

## 2017-03-19 DIAGNOSIS — E119 Type 2 diabetes mellitus without complications: Secondary | ICD-10-CM | POA: Insufficient documentation

## 2017-03-19 DIAGNOSIS — F329 Major depressive disorder, single episode, unspecified: Secondary | ICD-10-CM | POA: Insufficient documentation

## 2017-07-26 ENCOUNTER — Other Ambulatory Visit: Payer: Self-pay | Admitting: Family Medicine

## 2017-08-08 ENCOUNTER — Telehealth: Payer: Self-pay | Admitting: Family Medicine

## 2017-08-08 NOTE — Telephone Encounter (Signed)
Called pt to reschedule his appt with Dr. Tamala Julian on 08/13/18. Dr. Tamala Julian will not be in the office that day. When pt calls back, please reschedule him for any day other than 09/04/17 or 09/13/17.  Thanks!

## 2017-08-25 ENCOUNTER — Other Ambulatory Visit: Payer: Self-pay | Admitting: Family Medicine

## 2017-09-07 ENCOUNTER — Other Ambulatory Visit: Payer: Self-pay | Admitting: Family Medicine

## 2017-09-07 NOTE — Telephone Encounter (Signed)
Orders pended. Please advise.
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
## 2017-09-08 ENCOUNTER — Other Ambulatory Visit: Payer: Self-pay | Admitting: Family Medicine

## 2017-09-08 NOTE — Telephone Encounter (Signed)
Wife checking status, please advise [phone]
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
## 2017-09-12 MED ORDER — ATORVASTATIN CALCIUM 20 MG PO TABS: 20.0000 mg | ORAL_TABLET | Freq: Every day | ORAL | 0 refills | Status: DC

## 2017-09-12 MED ORDER — ESCITALOPRAM OXALATE 20 MG PO TABS: 20.0000 mg | ORAL_TABLET | Freq: Every day | ORAL | 0 refills | Status: DC

## 2017-09-12 MED ORDER — DILTIAZEM HCL ER COATED BEADS 120 MG PO CP24: ORAL_CAPSULE | ORAL | 0 refills | Status: DC

## 2017-09-12 MED ORDER — METFORMIN HCL 1000 MG PO TABS: 1000.0000 mg | ORAL_TABLET | Freq: Two times a day (BID) | ORAL | 0 refills | Status: DC

## 2017-09-12 MED ORDER — OMEPRAZOLE 20 MG PO CPDR: DELAYED_RELEASE_CAPSULE | ORAL | 0 refills | Status: DC

## 2017-09-12 NOTE — Telephone Encounter (Signed)
Refills approved.  Please advise wife/patient.

## 2017-09-13 ENCOUNTER — Encounter: Payer: 59 | Admitting: Family Medicine
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
## 2017-10-18 ENCOUNTER — Other Ambulatory Visit: Payer: Self-pay

## 2017-10-18 ENCOUNTER — Encounter: Payer: Self-pay | Admitting: Family Medicine

## 2017-10-18 ENCOUNTER — Ambulatory Visit (INDEPENDENT_AMBULATORY_CARE_PROVIDER_SITE_OTHER): Payer: 59 | Admitting: Family Medicine

## 2017-10-18 VITALS — BP 102/68 | HR 81 | Temp 98.0°F | Resp 16 | Ht 69.49 in | Wt 147.0 lb

## 2017-10-18 DIAGNOSIS — M818 Other osteoporosis without current pathological fracture: Secondary | ICD-10-CM

## 2017-10-18 DIAGNOSIS — E7800 Pure hypercholesterolemia, unspecified: Secondary | ICD-10-CM

## 2017-10-18 DIAGNOSIS — F324 Major depressive disorder, single episode, in partial remission: Secondary | ICD-10-CM | POA: Diagnosis not present

## 2017-10-18 DIAGNOSIS — R110 Nausea: Secondary | ICD-10-CM | POA: Diagnosis not present

## 2017-10-18 DIAGNOSIS — J411 Mucopurulent chronic bronchitis: Secondary | ICD-10-CM | POA: Diagnosis not present

## 2017-10-18 DIAGNOSIS — E890 Postprocedural hypothyroidism: Secondary | ICD-10-CM | POA: Diagnosis not present

## 2017-10-18 DIAGNOSIS — I480 Paroxysmal atrial fibrillation: Secondary | ICD-10-CM

## 2017-10-18 DIAGNOSIS — E119 Type 2 diabetes mellitus without complications: Secondary | ICD-10-CM | POA: Diagnosis not present

## 2017-10-18 DIAGNOSIS — G894 Chronic pain syndrome: Secondary | ICD-10-CM | POA: Diagnosis not present

## 2017-10-18 MED ORDER — OMEPRAZOLE 40 MG PO CPDR: DELAYED_RELEASE_CAPSULE | ORAL | 3 refills | Status: DC

## 2017-10-18 MED ORDER — METFORMIN HCL 1000 MG PO TABS: 1000.0000 mg | ORAL_TABLET | Freq: Every day | ORAL | 1 refills | Status: DC

## 2017-10-18 MED ORDER — VENLAFAXINE HCL ER 75 MG PO CP24: 75.0000 mg | ORAL_CAPSULE | Freq: Every day | ORAL | 1 refills | Status: DC

## 2017-10-18 MED ORDER — GLUCOSE BLOOD VI STRP: ORAL_STRIP | 3 refills | Status: DC

## 2017-10-18 MED ORDER — FREESTYLE LANCETS MISC: 3 refills | Status: DC

## 2017-10-18 MED ORDER — ESCITALOPRAM OXALATE 10 MG PO TABS: 10.0000 mg | ORAL_TABLET | Freq: Every day | ORAL | 0 refills | Status: DC

## 2017-10-18 NOTE — Patient Instructions (Addendum)
  INCREASE PRILOSEC TO 40MG  DAILY. START VENLAFAXINE ER 75MG  ONCE DAILY FOR ANXIETY/DEPRESSION. DECREASE LEXAPRO TO 10MG  DAILY.   IF you received an x-ray today, you will receive an invoice from [facility]. Please contact [facility] at [phone] with questions or concerns regarding your invoice.   IF you received labwork today, you will receive an invoice from [facility]. Please contact [facility] at [phone] with questions or concerns regarding your invoice.   Our billing staff will not be able to assist you with questions regarding bills from these companies.  You will be contacted with the lab results as soon as they are available. The fastest way to get your results is to activate your My Chart account. Instructions are located on the last page of this paperwork. If you have not heard from [place] regarding the results in 2 weeks, please contact this office.

## 2017-10-19 LAB — CBC WITH DIFFERENTIAL/PLATELET
Basophils Absolute: 0 10*3/uL (ref 0.0–0.2)
Basos: 0 %
EOS (ABSOLUTE): 0.5 10*3/uL — ABNORMAL HIGH (ref 0.0–0.4)
Eos: 5 %
Hematocrit: 38.5 % (ref 37.5–51.0)
Hemoglobin: 13 g/dL (ref 13.0–17.7)
Immature Grans (Abs): 0 10*3/uL (ref 0.0–0.1)
Immature Granulocytes: 0 %
Lymphocytes Absolute: 3.5 10*3/uL — ABNORMAL HIGH (ref 0.7–3.1)
Lymphs: 31 %
MCH: 30.4 pg (ref 26.6–33.0)
MCHC: 33.8 g/dL (ref 31.5–35.7)
MCV: 90 fL (ref 79–97)
Monocytes Absolute: 0.8 10*3/uL (ref 0.1–0.9)
Monocytes: 7 %
Neutrophils Absolute: 6.6 10*3/uL (ref 1.4–7.0)
Neutrophils: 57 %
Platelets: 293 10*3/uL (ref 150–379)
RBC: 4.28 x10E6/uL (ref 4.14–5.80)
RDW: 14.7 % (ref 12.3–15.4)
WBC: 11.6 10*3/uL — ABNORMAL HIGH (ref 3.4–10.8)

## 2017-10-19 LAB — HEMOGLOBIN A1C
Est. average glucose Bld gHb Est-mCnc: 143 mg/dL
Hgb A1c MFr Bld: 6.6 % — ABNORMAL HIGH (ref 4.8–5.6)

## 2017-10-19 LAB — COMPREHENSIVE METABOLIC PANEL
ALT: 11 IU/L (ref 0–44)
AST: 13 IU/L (ref 0–40)
Albumin/Globulin Ratio: 1.7 (ref 1.2–2.2)
Albumin: 4.5 g/dL (ref 3.5–5.5)
Alkaline Phosphatase: 67 IU/L (ref 39–117)
BUN/Creatinine Ratio: 18 (ref 9–20)
BUN: 17 mg/dL (ref 6–24)
Bilirubin Total: 0.2 mg/dL (ref 0.0–1.2)
CO2: 30 mmol/L — ABNORMAL HIGH (ref 20–29)
Calcium: 10.1 mg/dL (ref 8.7–10.2)
Chloride: 96 mmol/L (ref 96–106)
Creatinine, Ser: 0.93 mg/dL (ref 0.76–1.27)
GFR calc Af Amer: 104 mL/min/{1.73_m2} (ref >59)
GFR calc non Af Amer: 90 mL/min/{1.73_m2} (ref >59)
Globulin, Total: 2.6 g/dL (ref 1.5–4.5)
Glucose: 71 mg/dL (ref 65–99)
Potassium: 4.2 mmol/L (ref 3.5–5.2)
Sodium: 142 mmol/L (ref 134–144)
Total Protein: 7.1 g/dL (ref 6.0–8.5)

## 2017-10-19 LAB — LIPID PANEL
Chol/HDL Ratio: 3 ratio (ref 0.0–5.0)
Cholesterol, Total: 170 mg/dL (ref 100–199)
HDL: 56 mg/dL (ref >39)
LDL Calculated: 72 mg/dL (ref 0–99)
Triglycerides: 211 mg/dL — ABNORMAL HIGH (ref 0–149)
VLDL Cholesterol Cal: 42 mg/dL — ABNORMAL HIGH (ref 5–40)

## 2017-10-19 LAB — MICROALBUMIN, URINE: Microalbumin, Urine: 3.3 ug/mL

## 2017-10-19 LAB — TSH: TSH: 1.42 u[IU]/mL (ref 0.450–4.500)

## 2017-10-24 MED ORDER — DILTIAZEM HCL ER COATED BEADS 120 MG PO CP24: ORAL_CAPSULE | ORAL | 1 refills | Status: DC

## 2017-10-24 MED ORDER — ATORVASTATIN CALCIUM 20 MG PO TABS: 20.0000 mg | ORAL_TABLET | Freq: Every day | ORAL | 3 refills | Status: AC

## 2017-10-30 ENCOUNTER — Telehealth: Payer: Self-pay | Admitting: Family Medicine

## 2017-10-30 NOTE — Telephone Encounter (Signed)
Copied from [place] [id number]. Topic: Quick Communication - See Telephone Encounter >> Oct 30, 2017  2:08 PM Bea Graff, NT wrote: CRM for notification. See Telephone encounter for: Barn[facility] needs to verify which lancets pt needs fill. The rx says Freestyle but in the comments states One Touch Delica Lancets. CB#: [phone]. Ref#: [id number]  10/30/17.

## 2017-10-31 NOTE — Telephone Encounter (Signed)
[facility] call [facility]. Clarified with pharmacist lancets to be dispensed are One Touch Delica Lancets.

## 2018-01-15 ENCOUNTER — Encounter: Payer: Self-pay | Admitting: Family Medicine

## 2018-01-24 ENCOUNTER — Ambulatory Visit (INDEPENDENT_AMBULATORY_CARE_PROVIDER_SITE_OTHER): Payer: 59 | Admitting: Family Medicine

## 2018-01-24 ENCOUNTER — Encounter: Payer: Self-pay | Admitting: Family Medicine

## 2018-01-24 VITALS — BP 115/70 | HR 93 | Temp 98.0°F | Resp 16 | Ht 69.29 in | Wt 148.0 lb

## 2018-01-24 DIAGNOSIS — J8410 Pulmonary fibrosis, unspecified: Secondary | ICD-10-CM

## 2018-01-24 DIAGNOSIS — K219 Gastro-esophageal reflux disease without esophagitis: Secondary | ICD-10-CM

## 2018-01-24 DIAGNOSIS — E039 Hypothyroidism, unspecified: Secondary | ICD-10-CM

## 2018-01-24 DIAGNOSIS — I480 Paroxysmal atrial fibrillation: Secondary | ICD-10-CM

## 2018-01-24 DIAGNOSIS — E119 Type 2 diabetes mellitus without complications: Secondary | ICD-10-CM | POA: Diagnosis not present

## 2018-01-24 DIAGNOSIS — J411 Mucopurulent chronic bronchitis: Secondary | ICD-10-CM

## 2018-01-24 DIAGNOSIS — F32A Depression, unspecified: Secondary | ICD-10-CM

## 2018-01-24 DIAGNOSIS — F329 Major depressive disorder, single episode, unspecified: Secondary | ICD-10-CM

## 2018-01-24 DIAGNOSIS — L309 Dermatitis, unspecified: Secondary | ICD-10-CM

## 2018-01-24 DIAGNOSIS — F419 Anxiety disorder, unspecified: Secondary | ICD-10-CM

## 2018-01-24 DIAGNOSIS — E7800 Pure hypercholesterolemia, unspecified: Secondary | ICD-10-CM | POA: Diagnosis not present

## 2018-01-24 DIAGNOSIS — G43A0 Cyclical vomiting, not intractable: Secondary | ICD-10-CM

## 2018-01-24 DIAGNOSIS — R1115 Cyclical vomiting syndrome unrelated to migraine: Secondary | ICD-10-CM

## 2018-01-24 MED ORDER — VENLAFAXINE HCL ER 150 MG PO CP24: 150.0000 mg | ORAL_CAPSULE | Freq: Every day | ORAL | 1 refills | Status: AC

## 2018-01-24 NOTE — Patient Instructions (Addendum)
  Increase effexor/venlafaxine ER from 75mg  to 150mg  daily. Stop Lexapro.    Recommend using CREAM at bedtime; Eucerin or Cetaphil.    IF you received an x-ray today, you will receive an invoice from [facility]. Please contact [facility] at [phone] with questions or concerns regarding your invoice.   IF you received labwork today, you will receive an invoice from [facility]. Please contact [facility] at [phone] with questions or concerns regarding your invoice.   Our billing staff will not be able to assist you with questions regarding bills from these companies.  You will be contacted with the lab results as soon as they are available. The fastest way to get your results is to activate your My Chart account. Instructions are located on the last page of this paperwork. If you have not heard from [place] regarding the results in 2 weeks, please contact this office.

## 2018-01-24 NOTE — Progress Notes (Signed)
Subjective:    Patient ID: Kirk Robinson, male    DOB: 03-09-1956, 62 y.o.   MRN: [id number]  01/24/2018  Chronic Conditions (3 month follow-up )    HPI This 62 y.o. male presents with daughter for evaluation of DMII, hypercholesterolemia, anxiety and depression. Management changes made at Last visit include the following  Diabetes mellitus type 2: Controlled.  Obtain labs for chronic disease management.  Refill of metformin provided. Hypercholesterolemia: Controlled.  Obtain labs.  Continue statin therapy. Major depressive disorder: Uncontrolled on Lexapro 20 mg daily.  Add Effexor 75 mg 1 daily.  Decrease Lexapro to 10 mg daily. Nausea with vomiting: Chronic with recent worsening.  Increase Prilosec to 40 mg daily.  Obtain labs to rule out secondary causes.  Possibly medication side effect especially considering opiate use.  If no improvement with increase in Prilosec, refer to gastroenterology. Hypothyroidism secondary to radiation therapy for Graves' disease: Followed by Dr. Dareen Piano in [place]. COPD: Stable.  Followed by Dr. Raul Del pulmonology at [facility] in [place].  Maintained on oxygen therapy. Paroxysmal atrial fibrillation: Stable without recurrence.  Continue Cardizem therapy.  UPDATE: Doing well.   Lives in [place].  Not that far.   Right now, taking Venlafaxine 68m every morning; Lexapro 119mqhs.  No difference in depression/anxiety.  The same.  Gets depressed 3-5 times per week; 2 hours at a time.  Anxiety continues to be daily; a few hours. Found out today prolonged eating is not good.  Really hungry.    Prednisone is making skin thin and itchy.  Really dry; mosquitos are horrible.  Using gold bond lotion.  Has gold bond cream.  Bathes in cetaphil body wash.  Bathes rarely; couple times per month. Fallen in tub a couple of times.    Nausea and vomiting: increased Omeprazole to 4075maily; rarely occurring.  Improved per daughter; once per month. Possible  improvement.  COPD/pulmonary fibrosis:  FleRaul Delees quickly for acute illnesses.  No oxygen in office.  Has in car.  Chronic pain syndrome: neck pain DDD cervical spine.  Feet and hands are really bothersome.   Gave steroid injections on Friday and then had a CVA.  SAH.  Fourteen years ago.  Feet in the month at the hospital.  Then went back to work.  Baby glycerin suppository works great.   BP Readings from Last 3 Encounters:  01/24/18 115/70  10/18/17 102/68  03/01/17 117/68   Wt Readings from Last 3 Encounters:  01/24/18 148 lb (67.1 kg)  10/18/17 147 lb (66.7 kg)  03/01/17 147 lb (66.7 kg)   Immunization History  Administered Date(s) Administered  . Influenza Split 07/28/2011, 06/20/2012  . Influenza,inj,Quad PF,6+ Mos 05/14/2014, 04/24/2015  . Influenza-Unspecified 05/05/2016, 06/28/2016, 04/22/2017  . Pneumococcal Polysaccharide-23 04/24/2015  . Tdap 07/28/2011    Review of Systems  Constitutional: Negative for activity change, appetite change, chills, diaphoresis, fatigue, fever and unexpected weight change.  HENT: Negative for congestion, dental problem, drooling, ear discharge, ear pain, facial swelling, hearing loss, mouth sores, nosebleeds, postnasal drip, rhinorrhea, sinus pressure, sneezing, sore throat, tinnitus, trouble swallowing and voice change.   Eyes: Negative for photophobia, pain, discharge, redness, itching and visual disturbance.  Respiratory: Negative for apnea, cough, choking, chest tightness, shortness of breath, wheezing and stridor.   Cardiovascular: Negative for chest pain, palpitations and leg swelling.  Gastrointestinal: Positive for nausea. Negative for abdominal distention, abdominal pain, blood in stool, constipation, diarrhea, rectal pain and vomiting.  Endocrine: Negative for cold intolerance, heat intolerance,  polydipsia, polyphagia and polyuria.  Genitourinary: Negative for decreased urine volume, difficulty urinating, discharge, dysuria,  enuresis, flank pain, frequency, genital sores, hematuria, penile pain, penile swelling, scrotal swelling, testicular pain and urgency.  Musculoskeletal: Positive for arthralgias, back pain, neck pain and neck stiffness. Negative for gait problem, joint swelling and myalgias.  Skin: Positive for rash. Negative for color change, pallor and wound.  Allergic/Immunologic: Negative for environmental allergies, food allergies and immunocompromised state.  Neurological: Negative for dizziness, tremors, seizures, syncope, facial asymmetry, speech difficulty, weakness, light-headedness, numbness and headaches.  Hematological: Negative for adenopathy. Does not bruise/bleed easily.  Psychiatric/Behavioral: Positive for dysphoric mood. Negative for agitation, behavioral problems, confusion, decreased concentration, hallucinations, self-injury, sleep disturbance and suicidal ideas. The patient is nervous/anxious. The patient is not hyperactive.     Past Medical History:  Diagnosis Date  . Alcohol use 03/16/2007   patient risk factors  . Allergic rhinitis, cause unspecified   . Altered mental status   . Anxiety state, unspecified   . Cervicalgia 09/17/2007  . COPD (chronic obstructive pulmonary disease) ([place])   . Diabetes mellitus without complication ([place])   . Diplopia 12/21/2007  . Esophageal reflux   . Gastritis 05/13/2009  . HCAP (healthcare-associated pneumonia) 08/12/2014  . Hematuria 03/16/2007  . Hyperthyroidism    graves disease  . Iodine hypothyroidism   . Memory loss   . NSIP (nonspecific interstitial pneumonia) ([place])    on azathioprine  . Osteoporosis, unspecified    bone density per Morivati in 2012  . Other abnormal glucose 04/08/2008  . Other diseases of lung, not elsewhere classified 01/23/2009   s/p [place]/pulmonology consult; intolerant  to Advair  . Peripheral vascular disease, unspecified ([place]) 12/24/2009  . Personal history of colonic polyps   . Pneumonia, organism  unspecified(486)   . Pure hypercholesterolemia   . Shortness of breath   . Stroke ([place]) 2005  . Tobacco use disorder 03/16/2007   patient risk factors  . Unspecified hypothyroidism 01/07/2010  . 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 Admission 11/2011     altered mental status/confusion with syncope.  CT head negative, MRI brain negative, EEG: diffuse slowing but no  seizure acitivity.  Labs negative.  [facility].  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 EYE SURGERY  2010  . LAPAROSCOPIC REVISION VENTRICULAR-PERITONEAL (V-P) SHUNT Right 05/20/2014   Procedure: LAPAROSCOPIC REVISION VENTRICULAR-PERITONEAL (V-P) SHUNT;  Surgeon: Georganna Skeans, MD;  Location: [facility];  Service: General;  Laterality: Right;  . Neuropsychiatric evaluation  04/2012   poor short-term memory, poor recall, delayed reaction time; permanently disabled.     Allergies  Allergen Reactions  . Advair Diskus [Fluticasone-Salmeterol] Shortness Of Breath  . Erythromycin Nausea And Vomiting  . Iodinated Diagnostic Agents Shortness Of Breath  . Ativan [Lorazepam] Other (See Comments)    Caused pt's heart to stop  . Darvocet [Propoxyphene N-Acetaminophen] Nausea And Vomiting  . Doxycycline Hyclate Nausea And Vomiting, Swelling and Other (See Comments)    Tongue swelling, severe depression  . Propoxyphene Nausea And Vomiting  . Septra [Sulfamethoxazole-Trimethoprim] Rash   Current Outpatient Medications on File Prior to Visit  Medication Sig Dispense Refill  . albuterol (PROVENTIL HFA;VENTOLIN HFA) 108 (90 BASE) MCG/ACT inhaler Inhale 2 puffs into the lungs every 6 (six) hours as needed for wheezing or shortness of breath.    Marland Kitchen albuterol (PROVENTIL) (2.5 MG/3ML) 0.083% nebulizer solution Take 2.5 mg by  nebulization every 6 (six) hours as needed for wheezing or shortness of breath.    Marland Kitchen atorvastatin (LIPITOR) 20 MG tablet Take 1  tablet (20 mg total) by mouth daily at 6 PM. 90 tablet 3  . blood glucose meter kit and supplies KIT Check sugar once daily for Dx: E11.9 1 each 0  . diazepam (VALIUM) 5 MG tablet Take 5 mg by mouth 2 (two) times daily.     Marland Kitchen diltiazem (CARDIZEM CD) 120 MG 24 hr capsule TAKE 1 CAPSULE (120 MG TOTAL) BY MOUTH DAILY. 90 capsule 1  . escitalopram (LEXAPRO) 10 MG tablet Take 1 tablet (10 mg total) by mouth daily. Office visit needed for refills 90 tablet 0  . glucose blood test strip Check sugar once daily. Dx: E11.9 100 each 3  . Lancets (FREESTYLE) lancets Check sugars once daily.  Dx: E11.9 100 each 3  . metFORMIN (GLUCOPHAGE) 1000 MG tablet Take 1 tablet (1,000 mg total) by mouth daily with breakfast. Needs office visit for any additional refills 90 tablet 1  . morphine (MS CONTIN) 60 MG 12 hr tablet Take 60 mg by mouth every 8 (eight) hours.  0  . omeprazole (PRILOSEC) 40 MG capsule TAKE 1 CAPSULE (40 MG TOTAL) BY MOUTH DAILY. 90 capsule 3  . predniSONE (DELTASONE) 10 MG tablet Take 20 mg by mouth daily with breakfast.     . predniSONE (DELTASONE) 10 MG tablet TAKE 1 TABLET BY MOUTH EVERY DAY    . Tapentadol HCl (NUCYNTA) 75 MG TABS Take 1 tablet by mouth every 4 (four) hours as needed (pain).     Marland Kitchen thyroid (ARMOUR) 90 MG tablet Take 90 mg by mouth daily at 2 PM daily at 2 PM.     . tiotropium (SPIRIVA) 18 MCG inhalation capsule Place 18 mcg into inhaler and inhale daily.    . Vitamin D, Ergocalciferol, (DRISDOL) 50000 UNITS CAPS Take 50,000 Units by mouth every 7 (seven) days. On Wednesdays    . zoledronic acid (RECLAST) 5 MG/100ML SOLN injection Inject 5 mg into the vein See admin instructions. Given once per year     No current facility-administered medications on file prior to visit.    Social History   Socioeconomic History  . Marital status: Married    Spouse name: Not on file  . Number of children: 2  . Years of education: Not on file  . Highest education level: Not on file    Occupational History    Comment: works full time 40 hrs  Social Needs  . Financial resource strain: Not on file  . Food insecurity:    Worry: Not on file    Inability: Not on file  . Transportation needs:    Medical: Not on file    Non-medical: Not on file  Tobacco Use  . Smoking status: Current Every Day Smoker    Packs/day: 1.00    Years: 30.00    Pack years: 30.00  . Smokeless tobacco: Never Used  Substance and Sexual Activity  . Alcohol use: No  . Drug use: No  . Sexual activity: Never  Lifestyle  . Physical activity:    Days per week: Not on file    Minutes per session: Not on file  . Stress: Not on file  Relationships  . Social connections:    Talks on phone: Not on file    Gets together: Not on file    Attends religious service: Not on file    Active member  of club or organization: Not on file    Attends meetings of clubs or organizations: Not on file    Relationship status: Not on file  . Intimate partner violence:    Fear of current or ex partner: Not on file    Emotionally abused: Not on file    Physically abused: Not on file    Forced sexual activity: Not on file  Other Topics Concern  . Not on file  Social History Narrative   Marital status:  Married x 35 years.       Children:  Children ages 89, 52.   2 grandchildren (85, 3).      Lives: with wife.     Employment: disability since 2013.       Tobacco:  1/2 ppd x 40 years.      Alcohol: rarely.      Exercise: walking the dogs daily.   Caffeine use: Carbonated beverages, Pepsi: Coffee, many servings a day Pepsi 3-4  Coffee 2 cups. Smoke detectors in home. Always wears seatbelts. Exercise: Inactive. No guns in the home.   Family History  Problem Relation Age of Onset  . Heart disease Father        cad, chf  . Cancer Father   . Heart disease Brother 43       CABG  . Cancer Brother 70       pancreatic cancer  . Diabetes Brother   . Diabetes Brother   . Heart disease Brother 4       CABG  .  Diabetes Brother   . Heart disease Brother 9       CABG  . Cancer Brother 74       prostate cancer  . Heart disease Mother        cad  . Diabetes Mother   . Stroke Mother        Objective:    BP 115/70   Pulse 93   Temp 98 F (36.7 C) (Oral)   Resp 16   Ht 5' 9.29" (1.76 m)   Wt 148 lb (67.1 kg)   SpO2 95%   BMI 21.67 kg/m  Physical Exam  Constitutional: He is oriented to person, place, and time. He appears well-developed and well-nourished. No distress.  HENT:  Head: Normocephalic and atraumatic.  Right Ear: External ear normal.  Left Ear: External ear normal.  Nose: Nose normal.  Mouth/Throat: Oropharynx is clear and moist.  Eyes: Pupils are equal, round, and reactive to light. Conjunctivae and EOM are normal.  Neck: Normal range of motion. Neck supple. Carotid bruit is not present. No thyromegaly present.  Cardiovascular: Normal rate, regular rhythm, normal heart sounds and intact distal pulses. Exam reveals no gallop and no friction rub.  No murmur heard. Pulmonary/Chest: Effort normal and breath sounds normal. He has no wheezes. He has no rales.  Abdominal: Soft. Bowel sounds are normal. He exhibits no distension and no mass. There is no tenderness. There is no rebound and no guarding.  Musculoskeletal:       Right shoulder: Normal.       Left shoulder: Normal.       Cervical back: Normal.  Lymphadenopathy:    He has no cervical adenopathy.  Neurological: He is alert and oriented to person, place, and time. He has normal reflexes. No cranial nerve deficit. He exhibits normal muscle tone. Coordination normal.  Skin: Skin is warm and dry. No rash noted. He is not diaphoretic.  Psychiatric: He  has a normal mood and affect. His behavior is normal. Judgment and thought content normal.  Nursing note and vitals reviewed.  No results found. Depression screen [facility] 2/9 01/24/2018 10/18/2017 03/01/2017 07/12/2016 11/10/2015  Decreased Interest 0 1 1 0 3  Down, Depressed,  Hopeless 0 1 1 0 3  PHQ - 2 Score 0 2 2 0 6  Altered sleeping - 1 1 - 1  Tired, decreased energy - 1 2 - 2  Change in appetite - 1 0 - 0  Feeling bad or failure about yourself  - 1 1 - 1  Trouble concentrating - 0 0 - 1  Moving slowly or fidgety/restless - 0 - - 0  Suicidal thoughts - 0 0 - 0  PHQ-9 Score - 6 6 - 11   Fall Risk  01/24/2018 10/18/2017 03/01/2017 07/12/2016 11/10/2015  Falls in the past year? No Yes Yes No Yes  Number falls in past yr: - 1 1 - 2 or more  Injury with Fall? - No No - -  Risk Factor Category  - - - - -        Assessment & Plan:   1. Type 2 diabetes mellitus without complication, without long-term current use of insulin ([place])   2. Adult hypothyroidism   3. Pure hypercholesterolemia   4. Pulmonary fibrosis ([place])   5. Anxiety and depression   6. Paroxysmal atrial fibrillation ([facility])   7. Gastroesophageal reflux disease without esophagitis   8. Mucopurulent chronic bronchitis ([place])   9. Non-intractable cyclical vomiting with nausea   10. Dermatitis     Anxiety and depression:  Poorly controlled despite addition of Effexor.  Increase Effexor XR to 150 mg daily.  Wean Lexapro over the upcoming month.  Nausea with vomiting: improvement with increase of Omeprazole to 82m daily.  Continue to monitor.  DMII: controlled; obtain labs; continue current medications.  Hypercholesterolemia: controlled; obtain labs; continue meds.  Dermatitis: skin.  Recommend applying cream eucerin or cetaphil at bedtime.   Orders Placed This Encounter  Procedures  . CBC with Differential/Platelet  . Comprehensive metabolic panel    Order Specific Question:   Has the patient fasted?    Answer:   No  . Hemoglobin A1c  . Lipid panel    Order Specific Question:   Has the patient fasted?    Answer:   No  . Ferritin   Meds ordered this encounter  Medications  . venlafaxine XR (EFFEXOR-XR) 150 MG 24 hr capsule    Sig: Take 1 capsule (150 mg total) by mouth daily with  breakfast.    Dispense:  90 capsule    Refill:  1    Return in about 3 months (around 04/26/2018) for follow-up chronic medical conditions.   Frederica Chrestman MElayne Guerin M.D. [facility] [id number]  [phone]

## 2018-01-25 LAB — CBC WITH DIFFERENTIAL/PLATELET
Basophils Absolute: 0.1 10*3/uL (ref 0.0–0.2)
Basos: 0 %
EOS (ABSOLUTE): 0.4 10*3/uL (ref 0.0–0.4)
Eos: 3 %
Hematocrit: 37.3 % — ABNORMAL LOW (ref 37.5–51.0)
Hemoglobin: 12.7 g/dL — ABNORMAL LOW (ref 13.0–17.7)
Immature Grans (Abs): 0 10*3/uL (ref 0.0–0.1)
Immature Granulocytes: 0 %
Lymphocytes Absolute: 2.7 10*3/uL (ref 0.7–3.1)
Lymphs: 18 %
MCH: 30.5 pg (ref 26.6–33.0)
MCHC: 34 g/dL (ref 31.5–35.7)
MCV: 89 fL (ref 79–97)
Monocytes Absolute: 1.2 10*3/uL — ABNORMAL HIGH (ref 0.1–0.9)
Monocytes: 8 %
Neutrophils Absolute: 10.6 10*3/uL — ABNORMAL HIGH (ref 1.4–7.0)
Neutrophils: 71 %
Platelets: 270 10*3/uL (ref 150–450)
RBC: 4.17 x10E6/uL (ref 4.14–5.80)
RDW: 14.2 % (ref 12.3–15.4)
WBC: 15 10*3/uL — ABNORMAL HIGH (ref 3.4–10.8)

## 2018-01-25 LAB — LIPID PANEL
Chol/HDL Ratio: 4.1 ratio (ref 0.0–5.0)
Cholesterol, Total: 191 mg/dL (ref 100–199)
HDL: 47 mg/dL (ref >39)
LDL Calculated: 87 mg/dL (ref 0–99)
Triglycerides: 283 mg/dL — ABNORMAL HIGH (ref 0–149)
VLDL Cholesterol Cal: 57 mg/dL — ABNORMAL HIGH (ref 5–40)

## 2018-01-25 LAB — COMPREHENSIVE METABOLIC PANEL
ALT: 12 IU/L (ref 0–44)
AST: 17 IU/L (ref 0–40)
Albumin/Globulin Ratio: 1.5 (ref 1.2–2.2)
Albumin: 4.1 g/dL (ref 3.5–5.5)
Alkaline Phosphatase: 69 IU/L (ref 39–117)
BUN/Creatinine Ratio: 17 (ref 9–20)
BUN: 17 mg/dL (ref 6–24)
Bilirubin Total: 0.3 mg/dL (ref 0.0–1.2)
CO2: 30 mmol/L — ABNORMAL HIGH (ref 20–29)
Calcium: 9.8 mg/dL (ref 8.7–10.2)
Chloride: 97 mmol/L (ref 96–106)
Creatinine, Ser: 0.99 mg/dL (ref 0.76–1.27)
GFR calc Af Amer: 96 mL/min/{1.73_m2} (ref >59)
GFR calc non Af Amer: 83 mL/min/{1.73_m2} (ref >59)
Globulin, Total: 2.7 g/dL (ref 1.5–4.5)
Glucose: 82 mg/dL (ref 65–99)
Potassium: 4.9 mmol/L (ref 3.5–5.2)
Sodium: 142 mmol/L (ref 134–144)
Total Protein: 6.8 g/dL (ref 6.0–8.5)

## 2018-01-25 LAB — HEMOGLOBIN A1C
Est. average glucose Bld gHb Est-mCnc: 140 mg/dL
Hgb A1c MFr Bld: 6.5 % — ABNORMAL HIGH (ref 4.8–5.6)

## 2018-01-25 LAB — FERRITIN: Ferritin: 25 ng/mL — ABNORMAL LOW (ref 30–400)

## 2018-02-26 ENCOUNTER — Other Ambulatory Visit: Payer: Self-pay | Admitting: *Deleted

## 2018-02-26 DIAGNOSIS — J8410 Pulmonary fibrosis, unspecified: Secondary | ICD-10-CM

## 2018-03-03 ENCOUNTER — Other Ambulatory Visit: Payer: Self-pay | Admitting: Family Medicine

## 2018-03-06 NOTE — Telephone Encounter (Signed)
Lexapro denied; discontinued Lexapro at June visit.

## 2018-03-16 ENCOUNTER — Other Ambulatory Visit: Payer: Self-pay | Admitting: Family Medicine

## 2018-03-24 ENCOUNTER — Encounter: Payer: Self-pay | Admitting: Family Medicine

## 2019-02-25 ENCOUNTER — Other Ambulatory Visit: Payer: Self-pay | Admitting: Specialist

## 2019-02-25 DIAGNOSIS — J849 Interstitial pulmonary disease, unspecified: Secondary | ICD-10-CM

## 2019-02-28 ENCOUNTER — Other Ambulatory Visit: Payer: Self-pay

## 2019-02-28 ENCOUNTER — Ambulatory Visit (HOSPITAL_COMMUNITY)
Admission: RE | Admit: 2019-02-28 | Discharge: 2019-02-28 | Disposition: A | Discharge status: Home / Self Care | Payer: BC Managed Care – PPO | Source: Ambulatory Visit | Attending: Specialist | Admitting: Specialist

## 2019-02-28 DIAGNOSIS — J849 Interstitial pulmonary disease, unspecified: Secondary | ICD-10-CM | POA: Diagnosis present

## 2019-04-09 ENCOUNTER — Other Ambulatory Visit: Payer: Self-pay | Admitting: Specialist

## 2019-04-09 DIAGNOSIS — J849 Interstitial pulmonary disease, unspecified: Secondary | ICD-10-CM

## 2019-05-11 ENCOUNTER — Inpatient Hospital Stay
Admission: EM | Admit: 2019-05-11 | Discharge: 2019-05-21 | DRG: 637 | Disposition: A | Discharge status: Home Health | Payer: BC Managed Care – PPO | Attending: Internal Medicine | Admitting: Internal Medicine

## 2019-05-11 ENCOUNTER — Other Ambulatory Visit: Payer: Self-pay

## 2019-05-11 ENCOUNTER — Encounter: Payer: Self-pay | Admitting: Emergency Medicine

## 2019-05-11 ENCOUNTER — Emergency Department: Payer: BC Managed Care – PPO

## 2019-05-11 DIAGNOSIS — E039 Hypothyroidism, unspecified: Secondary | ICD-10-CM | POA: Diagnosis present

## 2019-05-11 DIAGNOSIS — Z7984 Long term (current) use of oral hypoglycemic drugs: Secondary | ICD-10-CM

## 2019-05-11 DIAGNOSIS — Z86010 Personal history of colonic polyps: Secondary | ICD-10-CM

## 2019-05-11 DIAGNOSIS — J9611 Chronic respiratory failure with hypoxia: Secondary | ICD-10-CM | POA: Diagnosis present

## 2019-05-11 DIAGNOSIS — Z8673 Personal history of transient ischemic attack (TIA), and cerebral infarction without residual deficits: Secondary | ICD-10-CM

## 2019-05-11 DIAGNOSIS — W19XXXA Unspecified fall, initial encounter: Secondary | ICD-10-CM | POA: Diagnosis present

## 2019-05-11 DIAGNOSIS — E1100 Type 2 diabetes mellitus with hyperosmolarity without nonketotic hyperglycemic-hyperosmolar coma (NKHHC): Principal | ICD-10-CM | POA: Diagnosis present

## 2019-05-11 DIAGNOSIS — R400 Somnolence: Secondary | ICD-10-CM

## 2019-05-11 DIAGNOSIS — J449 Chronic obstructive pulmonary disease, unspecified: Secondary | ICD-10-CM | POA: Diagnosis present

## 2019-05-11 DIAGNOSIS — Z79899 Other long term (current) drug therapy: Secondary | ICD-10-CM

## 2019-05-11 DIAGNOSIS — I471 Supraventricular tachycardia: Secondary | ICD-10-CM | POA: Diagnosis not present

## 2019-05-11 DIAGNOSIS — E860 Dehydration: Secondary | ICD-10-CM | POA: Diagnosis present

## 2019-05-11 DIAGNOSIS — I10 Essential (primary) hypertension: Secondary | ICD-10-CM | POA: Diagnosis present

## 2019-05-11 DIAGNOSIS — Z20828 Contact with and (suspected) exposure to other viral communicable diseases: Secondary | ICD-10-CM | POA: Diagnosis present

## 2019-05-11 DIAGNOSIS — Z823 Family history of stroke: Secondary | ICD-10-CM

## 2019-05-11 DIAGNOSIS — J8410 Pulmonary fibrosis, unspecified: Secondary | ICD-10-CM

## 2019-05-11 DIAGNOSIS — Z881 Allergy status to other antibiotic agents status: Secondary | ICD-10-CM | POA: Diagnosis not present

## 2019-05-11 DIAGNOSIS — Z8249 Family history of ischemic heart disease and other diseases of the circulatory system: Secondary | ICD-10-CM

## 2019-05-11 DIAGNOSIS — F17210 Nicotine dependence, cigarettes, uncomplicated: Secondary | ICD-10-CM | POA: Diagnosis present

## 2019-05-11 DIAGNOSIS — G9340 Encephalopathy, unspecified: Secondary | ICD-10-CM | POA: Diagnosis not present

## 2019-05-11 DIAGNOSIS — Z8042 Family history of malignant neoplasm of prostate: Secondary | ICD-10-CM

## 2019-05-11 DIAGNOSIS — Z882 Allergy status to sulfonamides status: Secondary | ICD-10-CM

## 2019-05-11 DIAGNOSIS — Z982 Presence of cerebrospinal fluid drainage device: Secondary | ICD-10-CM | POA: Diagnosis not present

## 2019-05-11 DIAGNOSIS — F411 Generalized anxiety disorder: Secondary | ICD-10-CM | POA: Diagnosis present

## 2019-05-11 DIAGNOSIS — I959 Hypotension, unspecified: Secondary | ICD-10-CM | POA: Diagnosis not present

## 2019-05-11 DIAGNOSIS — K5900 Constipation, unspecified: Secondary | ICD-10-CM | POA: Diagnosis not present

## 2019-05-11 DIAGNOSIS — G894 Chronic pain syndrome: Secondary | ICD-10-CM | POA: Diagnosis present

## 2019-05-11 DIAGNOSIS — J8489 Other specified interstitial pulmonary diseases: Secondary | ICD-10-CM | POA: Diagnosis not present

## 2019-05-11 DIAGNOSIS — E1151 Type 2 diabetes mellitus with diabetic peripheral angiopathy without gangrene: Secondary | ICD-10-CM | POA: Diagnosis present

## 2019-05-11 DIAGNOSIS — Z833 Family history of diabetes mellitus: Secondary | ICD-10-CM

## 2019-05-11 DIAGNOSIS — R410 Disorientation, unspecified: Secondary | ICD-10-CM | POA: Diagnosis not present

## 2019-05-11 DIAGNOSIS — T85618A Breakdown (mechanical) of other specified internal prosthetic devices, implants and grafts, initial encounter: Secondary | ICD-10-CM

## 2019-05-11 DIAGNOSIS — F329 Major depressive disorder, single episode, unspecified: Secondary | ICD-10-CM | POA: Diagnosis present

## 2019-05-11 DIAGNOSIS — K219 Gastro-esophageal reflux disease without esophagitis: Secondary | ICD-10-CM | POA: Diagnosis present

## 2019-05-11 DIAGNOSIS — E7800 Pure hypercholesterolemia, unspecified: Secondary | ICD-10-CM | POA: Diagnosis present

## 2019-05-11 DIAGNOSIS — Z79890 Hormone replacement therapy (postmenopausal): Secondary | ICD-10-CM

## 2019-05-11 DIAGNOSIS — Z66 Do not resuscitate: Secondary | ICD-10-CM | POA: Diagnosis present

## 2019-05-11 DIAGNOSIS — Z23 Encounter for immunization: Secondary | ICD-10-CM

## 2019-05-11 DIAGNOSIS — F0390 Unspecified dementia without behavioral disturbance: Secondary | ICD-10-CM | POA: Diagnosis present

## 2019-05-11 DIAGNOSIS — Z7952 Long term (current) use of systemic steroids: Secondary | ICD-10-CM

## 2019-05-11 DIAGNOSIS — Z9981 Dependence on supplemental oxygen: Secondary | ICD-10-CM

## 2019-05-11 DIAGNOSIS — M810 Age-related osteoporosis without current pathological fracture: Secondary | ICD-10-CM | POA: Diagnosis present

## 2019-05-11 DIAGNOSIS — G9341 Metabolic encephalopathy: Secondary | ICD-10-CM | POA: Diagnosis present

## 2019-05-11 DIAGNOSIS — E785 Hyperlipidemia, unspecified: Secondary | ICD-10-CM | POA: Diagnosis present

## 2019-05-11 DIAGNOSIS — Z888 Allergy status to other drugs, medicaments and biological substances status: Secondary | ICD-10-CM | POA: Diagnosis not present

## 2019-05-11 DIAGNOSIS — R634 Abnormal weight loss: Secondary | ICD-10-CM

## 2019-05-11 DIAGNOSIS — I480 Paroxysmal atrial fibrillation: Secondary | ICD-10-CM | POA: Diagnosis present

## 2019-05-11 DIAGNOSIS — Z800 Family history of malignant neoplasm of digestive organs: Secondary | ICD-10-CM

## 2019-05-11 LAB — COMPREHENSIVE METABOLIC PANEL
ALT: 30 U/L (ref 0–44)
AST: 21 U/L (ref 15–41)
Albumin: 3.7 g/dL (ref 3.5–5.0)
Alkaline Phosphatase: 152 U/L — ABNORMAL HIGH (ref 38–126)
Anion gap: 13 (ref 5–15)
BUN: 19 mg/dL (ref 6–20)
CO2: 30 mmol/L (ref 22–32)
Calcium: 10.3 mg/dL (ref 8.9–10.3)
Chloride: 92 mmol/L — ABNORMAL LOW (ref 98–111)
Creatinine, Ser: 1.14 mg/dL (ref 0.61–1.24)
GFR calc Af Amer: >60 mL/min (ref >60)
GFR calc non Af Amer: >60 mL/min (ref >60)
Glucose, Bld: 883 mg/dL (ref 70–99)
Potassium: 4 mmol/L (ref 3.5–5.1)
Sodium: 135 mmol/L (ref 135–145)
Total Bilirubin: 0.7 mg/dL (ref 0.3–1.2)
Total Protein: 6.8 g/dL (ref 6.5–8.1)

## 2019-05-11 LAB — GLUCOSE, CAPILLARY
Glucose-Capillary: 120 mg/dL — ABNORMAL HIGH (ref 70–99)
Glucose-Capillary: 124 mg/dL — ABNORMAL HIGH (ref 70–99)
Glucose-Capillary: 138 mg/dL — ABNORMAL HIGH (ref 70–99)
Glucose-Capillary: 170 mg/dL — ABNORMAL HIGH (ref 70–99)
Glucose-Capillary: 182 mg/dL — ABNORMAL HIGH (ref 70–99)
Glucose-Capillary: 202 mg/dL — ABNORMAL HIGH (ref 70–99)
Glucose-Capillary: 249 mg/dL — ABNORMAL HIGH (ref 70–99)
Glucose-Capillary: 278 mg/dL — ABNORMAL HIGH (ref 70–99)
Glucose-Capillary: 294 mg/dL — ABNORMAL HIGH (ref 70–99)
Glucose-Capillary: 458 mg/dL — ABNORMAL HIGH (ref 70–99)
Glucose-Capillary: >600 mg/dL (ref 70–99)

## 2019-05-11 LAB — CBC WITH DIFFERENTIAL/PLATELET
Abs Immature Granulocytes: 0.26 10*3/uL — ABNORMAL HIGH (ref 0.00–0.07)
Basophils Absolute: 0 10*3/uL (ref 0.0–0.1)
Basophils Relative: 0 %
Eosinophils Absolute: 0.1 10*3/uL (ref 0.0–0.5)
Eosinophils Relative: 1 %
HCT: 38.6 % — ABNORMAL LOW (ref 39.0–52.0)
Hemoglobin: 13.4 g/dL (ref 13.0–17.0)
Immature Granulocytes: 3 %
Lymphocytes Relative: 6 %
Lymphs Abs: 0.5 10*3/uL — ABNORMAL LOW (ref 0.7–4.0)
MCH: 30.6 pg (ref 26.0–34.0)
MCHC: 34.7 g/dL (ref 30.0–36.0)
MCV: 88.1 fL (ref 80.0–100.0)
Monocytes Absolute: 0.6 10*3/uL (ref 0.1–1.0)
Monocytes Relative: 7 %
Neutro Abs: 6.6 10*3/uL (ref 1.7–7.7)
Neutrophils Relative %: 83 %
Platelets: 234 10*3/uL (ref 150–400)
RBC: 4.38 MIL/uL (ref 4.22–5.81)
RDW: 15.6 % — ABNORMAL HIGH (ref 11.5–15.5)
WBC: 8.1 10*3/uL (ref 4.0–10.5)
nRBC: 0 % (ref 0.0–0.2)

## 2019-05-11 LAB — URINALYSIS, COMPLETE (UACMP) WITH MICROSCOPIC
Bacteria, UA: NONE SEEN
Bilirubin Urine: NEGATIVE
Glucose, UA: >=500 mg/dL — AB
Ketones, ur: NEGATIVE mg/dL
Leukocytes,Ua: NEGATIVE
Nitrite: NEGATIVE
Protein, ur: NEGATIVE mg/dL
Specific Gravity, Urine: 1.031 — ABNORMAL HIGH (ref 1.005–1.030)
Squamous Epithelial / HPF: NONE SEEN (ref 0–5)
pH: 5 (ref 5.0–8.0)

## 2019-05-11 LAB — BLOOD GAS, VENOUS
Acid-Base Excess: 12.7 mmol/L — ABNORMAL HIGH (ref 0.0–2.0)
Bicarbonate: 38.6 mmol/L — ABNORMAL HIGH (ref 20.0–28.0)
O2 Saturation: 96.8 %
Patient temperature: 37
pCO2, Ven: 53 mmHg (ref 44.0–60.0)
pH, Ven: 7.47 — ABNORMAL HIGH (ref 7.250–7.430)
pO2, Ven: 83 mmHg — ABNORMAL HIGH (ref 32.0–45.0)

## 2019-05-11 LAB — HEMOGLOBIN A1C
Hgb A1c MFr Bld: 13.2 % — ABNORMAL HIGH (ref 4.8–5.6)
Mean Plasma Glucose: 332.14 mg/dL

## 2019-05-11 LAB — MRSA PCR SCREENING: MRSA by PCR: NEGATIVE

## 2019-05-11 MED ORDER — SODIUM CHLORIDE 0.9 % IV SOLN
INTRAVENOUS | Status: DC
  Administered 2019-05-11: 16:00:00 via INTRAVENOUS

## 2019-05-11 MED ORDER — DILTIAZEM HCL ER COATED BEADS 120 MG PO CP24
120.0000 mg | ORAL_CAPSULE | Freq: Every day | ORAL | Status: DC
  Administered 2019-05-12 – 2019-05-18 (×5): 120 mg via ORAL
  Filled 2019-05-11 (×8): qty 1

## 2019-05-11 MED ORDER — DEXTROSE-NACL 5-0.45 % IV SOLN
INTRAVENOUS | Status: DC
  Administered 2019-05-11 – 2019-05-12 (×3): via INTRAVENOUS

## 2019-05-11 MED ORDER — TAPENTADOL HCL 75 MG PO TABS: 75.0000 mg | ORAL_TABLET | ORAL | Status: DC

## 2019-05-11 MED ORDER — SODIUM CHLORIDE 0.9 % IV BOLUS
1000.0000 mL | Freq: Once | INTRAVENOUS | Status: AC
  Administered 2019-05-11: 1000 mL via INTRAVENOUS

## 2019-05-11 MED ORDER — ALBUTEROL SULFATE (2.5 MG/3ML) 0.083% IN NEBU: 2.5000 mg | INHALATION_SOLUTION | RESPIRATORY_TRACT | Status: DC | PRN

## 2019-05-11 MED ORDER — SODIUM CHLORIDE 0.9% FLUSH: 3.0000 mL | Freq: Once | INTRAVENOUS | Status: DC

## 2019-05-11 MED ORDER — ATORVASTATIN CALCIUM 20 MG PO TABS
20.0000 mg | ORAL_TABLET | Freq: Every day | ORAL | Status: DC
  Administered 2019-05-13 – 2019-05-17 (×4): 20 mg via ORAL
  Filled 2019-05-11 (×5): qty 1

## 2019-05-11 MED ORDER — INSULIN REGULAR(HUMAN) IN NACL 100-0.9 UT/100ML-% IV SOLN
INTRAVENOUS | Status: DC
  Administered 2019-05-11: 5.4 [IU]/h via INTRAVENOUS
  Filled 2019-05-11 (×2): qty 100

## 2019-05-11 MED ORDER — ESCITALOPRAM OXALATE 10 MG PO TABS
10.0000 mg | ORAL_TABLET | Freq: Every day | ORAL | Status: DC
  Filled 2019-05-11: qty 1

## 2019-05-11 MED ORDER — DIAZEPAM 5 MG PO TABS
5.0000 mg | ORAL_TABLET | Freq: Two times a day (BID) | ORAL | Status: DC
  Administered 2019-05-13 – 2019-05-21 (×12): 5 mg via ORAL
  Filled 2019-05-11 (×15): qty 1

## 2019-05-11 MED ORDER — PREDNISONE 20 MG PO TABS
20.0000 mg | ORAL_TABLET | Freq: Every day | ORAL | Status: DC
  Administered 2019-05-12 – 2019-05-21 (×8): 20 mg via ORAL
  Filled 2019-05-11: qty 1
  Filled 2019-05-11 (×3): qty 2
  Filled 2019-05-11 (×4): qty 1
  Filled 2019-05-11 (×2): qty 2

## 2019-05-11 MED ORDER — POTASSIUM CHLORIDE 10 MEQ/100ML IV SOLN
10.0000 meq | INTRAVENOUS | Status: AC
  Administered 2019-05-11 (×2): 10 meq via INTRAVENOUS
  Filled 2019-05-11 (×2): qty 100

## 2019-05-11 MED ORDER — ONDANSETRON HCL 4 MG PO TABS: 4.0000 mg | ORAL_TABLET | Freq: Four times a day (QID) | ORAL | Status: DC | PRN

## 2019-05-11 MED ORDER — SODIUM CHLORIDE 0.9 % IV SOLN: INTRAVENOUS | Status: AC

## 2019-05-11 MED ORDER — PANTOPRAZOLE SODIUM 40 MG PO TBEC
40.0000 mg | DELAYED_RELEASE_TABLET | Freq: Every day | ORAL | Status: DC
  Administered 2019-05-12 – 2019-05-21 (×7): 40 mg via ORAL
  Filled 2019-05-11 (×10): qty 1

## 2019-05-11 MED ORDER — THYROID 60 MG PO TABS
90.0000 mg | ORAL_TABLET | Freq: Every day | ORAL | Status: DC
  Filled 2019-05-11 (×2): qty 1

## 2019-05-11 MED ORDER — ENOXAPARIN SODIUM 40 MG/0.4ML ~~LOC~~ SOLN
40.0000 mg | SUBCUTANEOUS | Status: DC
  Administered 2019-05-11 – 2019-05-13 (×3): 40 mg via SUBCUTANEOUS
  Filled 2019-05-11 (×3): qty 0.4

## 2019-05-11 MED ORDER — SENNOSIDES-DOCUSATE SODIUM 8.6-50 MG PO TABS: 1.0000 | ORAL_TABLET | Freq: Every evening | ORAL | Status: DC | PRN

## 2019-05-11 MED ORDER — MORPHINE SULFATE ER 15 MG PO TBCR
60.0000 mg | EXTENDED_RELEASE_TABLET | Freq: Three times a day (TID) | ORAL | Status: DC
  Administered 2019-05-12: 60 mg via ORAL
  Filled 2019-05-11: qty 4

## 2019-05-11 MED ORDER — ONDANSETRON HCL 4 MG/2ML IJ SOLN: 4.0000 mg | Freq: Four times a day (QID) | INTRAMUSCULAR | Status: DC | PRN

## 2019-05-11 MED ORDER — SODIUM CHLORIDE 0.9 % IV SOLN
INTRAVENOUS | Status: DC
  Administered 2019-05-11: 17:00:00 via INTRAVENOUS

## 2019-05-11 MED ORDER — CHLORHEXIDINE GLUCONATE CLOTH 2 % EX PADS
6.0000 | MEDICATED_PAD | Freq: Every day | CUTANEOUS | Status: DC
  Administered 2019-05-11 – 2019-05-14 (×4): 6 via TOPICAL

## 2019-05-11 MED ORDER — TIOTROPIUM BROMIDE MONOHYDRATE 18 MCG IN CAPS
18.0000 ug | ORAL_CAPSULE | Freq: Every day | RESPIRATORY_TRACT | Status: DC
  Filled 2019-05-11: qty 5

## 2019-05-11 MED ORDER — TAPENTADOL HCL 75 MG PO TABS
75.0000 mg | ORAL_TABLET | ORAL | Status: DC | PRN
  Filled 2019-05-11: qty 1

## 2019-05-11 MED ORDER — BISACODYL 5 MG PO TBEC
5.0000 mg | DELAYED_RELEASE_TABLET | Freq: Every day | ORAL | Status: DC | PRN
  Administered 2019-05-17: 22:00:00 5 mg via ORAL
  Filled 2019-05-11: qty 1

## 2019-05-11 MED ORDER — VENLAFAXINE HCL ER 75 MG PO CP24
150.0000 mg | ORAL_CAPSULE | Freq: Every day | ORAL | Status: DC
  Administered 2019-05-17 – 2019-05-21 (×5): 150 mg via ORAL
  Filled 2019-05-11 (×7): qty 2

## 2019-05-11 NOTE — Consult Note (Signed)
Reason for Consult: Assistance with management of hyperosmolar state Referring Physician: Estanislado Spire MD Primary pulmonologist: Wallene Huh, MD  Kirk Robinson is an 63 y.o. male.  HPI: 63 year old current smoker with multiple comorbidities as noted below resented to [facility] because of increasing confusion over the last week prior to admission.  Patient cannot provide adequate history due to lethargy.  Wife states that he has been declining in general for approximately 3 months.  Over the last 2 months he has lost significant amount of weight of anywhere between 10 to 15 pounds.  Recently he had an episode where he was believed to have been dehydrated and was treated with IV fluids at a physician's office.  He has a history of NSIP and takes steroids chronically. On 15 September he was evaluated by his pulmonologist and was noted to have elevations on his liver profile.  For this reason Imuran which was being used for NSIP was discontinued and a prescription was sent for Esbriet.  Patient has not started this medication yet.  Per the office visit it appears that he was somewhat confused at that time but still with no obvious signs to point that a specific etiology.  The patient was afebrile at that time.  No further history can be elicited.  If does not report any tremors, seizure-like activity or other neurological deficit over his baseline.  No further history can be gleaned.  Past Medical History:  Diagnosis Date  . Alcohol use 03/16/2007   patient risk factors  . Allergic rhinitis, cause unspecified   . Altered mental status   . Anxiety state, unspecified   . Cervicalgia 09/17/2007  . COPD (chronic obstructive pulmonary disease) ([place])   . Diabetes mellitus without complication ([place])   . Diplopia 12/21/2007  . Esophageal reflux   . Gastritis 05/13/2009  . HCAP (healthcare-associated pneumonia) 08/12/2014  . Hematuria 03/16/2007  . Hyperthyroidism    graves disease  . Iodine hypothyroidism    . Memory loss   . NSIP (nonspecific interstitial pneumonia) ([place])    on azathioprine  . Osteoporosis, unspecified    bone density per [facility] in 2012  . Other abnormal glucose 04/08/2008  . Other diseases of lung, not elsewhere classified 01/23/2009   s/p [place]/pulmonology consult; intolerant  to Advair  . Peripheral vascular disease, unspecified ([place]) 12/24/2009  . Personal history of colonic polyps   . Pneumonia, organism unspecified([place])   . Pure hypercholesterolemia   . Shortness of breath   . Stroke ([place]) 2005  . Tobacco use disorder 03/16/2007   patient risk factors  . Unspecified hypothyroidism 01/07/2010  . 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 Admission 11/2011     altered mental status/confusion with syncope.  CT head negative, MRI brain negative, EEG: diffuse slowing but no  seizure acitivity.  Labs negative.  [facility].  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 EYE SURGERY  2010  . LAPAROSCOPIC REVISION VENTRICULAR-PERITONEAL (V-P) SHUNT Right 05/20/2014   Procedure: LAPAROSCOPIC REVISION VENTRICULAR-PERITONEAL (V-P) SHUNT;  Surgeon: Georganna Skeans, MD;  Location: [facility];  Service: General;  Laterality: Right;  . Neuropsychiatric evaluation  04/2012   poor short-term memory, poor recall, delayed reaction time; permanently disabled.      Family History  Problem Relation Age of Onset  . Heart disease Father        cad, chf  . Cancer Father   .  Heart disease Brother 54       CABG  . Cancer Brother 38       pancreatic cancer  . Diabetes Brother   . Diabetes Brother   . Heart disease Brother 2       CABG  . Diabetes Brother   . Heart disease Brother 77       CABG  . Cancer Brother 15       prostate cancer  . Heart disease Mother        cad  . Diabetes Mother   . Stroke Mother     Social History:  reports that he has been smoking. He has a 30.00 pack-year  smoking history. He has never used smokeless tobacco. He reports that he does not drink alcohol or use drugs.  Allergies:  Allergies  Allergen Reactions  . Advair Diskus [Fluticasone-Salmeterol] Shortness Of Breath  . Erythromycin Nausea And Vomiting  . Iodinated Diagnostic Agents Shortness Of Breath  . Ativan [Lorazepam] Other (See Comments)    Caused pt's heart to stop  . Darvocet [Propoxyphene N-Acetaminophen] Nausea And Vomiting  . Doxycycline Hyclate Nausea And Vomiting, Swelling and Other (See Comments)    Tongue swelling, severe depression  . Propoxyphene Nausea And Vomiting  . Septra [Sulfamethoxazole-Trimethoprim] Rash    Medications: I have reviewed the patient's current medications.  I have reviewed his medications prior to admission.  Results for orders placed or performed during the hospital encounter of 05/11/19 (from the past 48 hour(s))  Urinalysis, Complete w Microscopic     Status: Abnormal   Collection Time: 05/11/19 11:59 AM  Result Value Ref Range   Color, Urine STRAW (A) YELLOW   APPearance CLEAR (A) CLEAR   Specific Gravity, Urine 1.031 (H) 1.005 - 1.030   pH 5.0 5.0 - 8.0   Glucose, UA >=500 (A) NEGATIVE mg/dL   Hgb urine dipstick SMALL (A) NEGATIVE   Bilirubin Urine NEGATIVE NEGATIVE   Ketones, ur NEGATIVE NEGATIVE mg/dL   Protein, ur NEGATIVE NEGATIVE mg/dL   Nitrite NEGATIVE NEGATIVE   Leukocytes,Ua NEGATIVE NEGATIVE   RBC / HPF 0-5 0 - 5 RBC/hpf   WBC, UA 0-5 0 - 5 WBC/hpf   Bacteria, UA NONE SEEN NONE SEEN   Squamous Epithelial / LPF NONE SEEN 0 - 5    Comment: Performed at [facility], [address] [place], [place] [address]  Comprehensive metabolic panel     Status: Abnormal   Collection Time: 05/11/19 11:59 AM  Result Value Ref Range   Sodium 135 135 - 145 mmol/L   Potassium 4.0 3.5 - 5.1 mmol/L   Chloride 92 (L) 98 - 111 mmol/L   CO2 30 22 - 32 mmol/L   Glucose, Bld 883 (HH) 70 - 99 mg/dL    Comment: CRITICAL RESULT CALLED  TO, READ BACK BY AND VERIFIED WITH DAJEA SCOTT 05/11/2019 1235 KBH    BUN 19 6 - 20 mg/dL   Creatinine, Ser 1.14 0.61 - 1.24 mg/dL   Calcium 10.3 8.9 - 10.3 mg/dL   Total Protein 6.8 6.5 - 8.1 g/dL   Albumin 3.7 3.5 - 5.0 g/dL   AST 21 15 - 41 U/L   ALT 30 0 - 44 U/L   Alkaline Phosphatase 152 (H) 38 - 126 U/L   Total Bilirubin 0.7 0.3 - 1.2 mg/dL   GFR calc non Af Amer >60 >60 mL/min   GFR calc Af Amer >60 >60 mL/min   Anion gap 13 5 -  15    Comment: Performed at [facility], [address] [place], [place] [address]  CBC with Differential     Status: Abnormal   Collection Time: 05/11/19 11:59 AM  Result Value Ref Range   WBC 8.1 4.0 - 10.5 K/uL   RBC 4.38 4.22 - 5.81 MIL/uL   Hemoglobin 13.4 13.0 - 17.0 g/dL   HCT 38.6 (L) 39.0 - 52.0 %   MCV 88.1 80.0 - 100.0 fL   MCH 30.6 26.0 - 34.0 pg   MCHC 34.7 30.0 - 36.0 g/dL   RDW 15.6 (H) 11.5 - 15.5 %   Platelets 234 150 - 400 K/uL   nRBC 0.0 0.0 - 0.2 %   Neutrophils Relative % 83 %   Neutro Abs 6.6 1.7 - 7.7 K/uL   Lymphocytes Relative 6 %   Lymphs Abs 0.5 (L) 0.7 - 4.0 K/uL   Monocytes Relative 7 %   Monocytes Absolute 0.6 0.1 - 1.0 K/uL   Eosinophils Relative 1 %   Eosinophils Absolute 0.1 0.0 - 0.5 K/uL   Basophils Relative 0 %   Basophils Absolute 0.0 0.0 - 0.1 K/uL   Immature Granulocytes 3 %   Abs Immature Granulocytes 0.26 (H) 0.00 - 0.07 K/uL    Comment: Performed at [facility], [address] [place], [place] [address]  Glucose, capillary     Status: Abnormal   Collection Time: 05/11/19 12:39 PM  Result Value Ref Range   Glucose-Capillary >600 (HH) 70 - 99 mg/dL  Blood gas, venous     Status: Abnormal   Collection Time: 05/11/19 12:57 PM  Result Value Ref Range   pH, Ven 7.47 (H) 7.250 - 7.430   pCO2, Ven 53 44.0 - 60.0 mmHg   pO2, Ven 83.0 (H) 32.0 - 45.0 mmHg   Bicarbonate 38.6 (H) 20.0 - 28.0 mmol/L   Acid-Base Excess 12.7 (H) 0.0 - 2.0 mmol/L   O2 Saturation 96.8 %   Patient  temperature 37.0    Collection site VENOUS    Sample type VENOUS     Comment: Performed at [facility], [address] [place], [place] [address]  Glucose, capillary     Status: Abnormal   Collection Time: 05/11/19  3:09 PM  Result Value Ref Range   Glucose-Capillary 458 (H) 70 - 99 mg/dL  Glucose, capillary     Status: Abnormal   Collection Time: 05/11/19  4:34 PM  Result Value Ref Range   Glucose-Capillary 294 (H) 70 - 99 mg/dL  Glucose, capillary     Status: Abnormal   Collection Time: 05/11/19  4:51 PM  Result Value Ref Range   Glucose-Capillary 278 (H) 70 - 99 mg/dL  MRSA PCR Screening     Status: None   Collection Time: 05/11/19  4:55 PM   Specimen: Nasopharyngeal  Result Value Ref Range   MRSA by PCR NEGATIVE NEGATIVE    Comment:        The GeneXpert MRSA Assay (FDA approved for NASAL specimens only), is one component of a comprehensive MRSA colonization surveillance program. It is not intended to diagnose MRSA infection nor to guide or monitor treatment for MRSA infections. Performed at [facility], [address] [place], [place] [address]   Glucose, capillary     Status: Abnormal   Collection Time: 05/11/19  5:39 PM  Result Value Ref Range   Glucose-Capillary 249 (H) 70 - 99 mg/dL  Glucose, capillary     Status: Abnormal  Collection Time: 05/11/19  6:35 PM  Result Value Ref Range   Glucose-Capillary 202 (H) 70 - 99 mg/dL    Dg Chest 2 View  Result Date: 05/11/2019 CLINICAL DATA:  Shortness of breath EXAM: CHEST - 2 VIEW COMPARISON:  02/28/2019 FINDINGS: The heart size and mediastinal contours are within normal limits. Chronic coarsened interstitial markings with basilar predominant reticular opacities, left greater than right. No pleural effusion. No pneumothorax. Partially visualized shunt catheter descends the anterior left chest wall. IMPRESSION: Chronic findings of interstitial lung disease, grossly stable compared to prior chest CT.  A superimposed infectious process would be difficult to exclude. Electronically Signed   By: Davina Poke M.D.   On: 05/11/2019 13:28   Ct Head Wo Contrast  Result Date: 05/11/2019 CLINICAL DATA:  Wife states has had some vagueness and confusion x 1 week. States is weaker than usual. Patient on 3l 02 due to history of pulmonary fibrosis. Patient can states first and last name however is quite lethargic sitting in wheelchair. TKV EXAM: CT HEAD WITHOUT CONTRAST CT CERVICAL SPINE WITHOUT CONTRAST TECHNIQUE: Multidetector CT imaging of the head and cervical spine was performed following the standard protocol without intravenous contrast. Multiplanar CT image reconstructions of the cervical spine were also generated. COMPARISON:  03/13/2015, 09/21/2009 FINDINGS: CT HEAD FINDINGS Brain: Left frontal ventriculostomy catheter extends with its tip just posterior to the right caudate nucleus as before. No hydrocephalus. Patchy areas of white matter attenuation most marked in the frontal lobes as before. Negative for acute intracranial hemorrhage, midline shift, focal parenchymal edema, mass, or mass effect. Acute infarct may be inapparent on noncontrast CT. Vascular: No hyperdense vessel or unexpected calcification. Skull: Left ventriculostomy defect as before. Negative for fracture or focal lesion. Sinuses/Orbits: No acute finding. Other: None CT CERVICAL SPINE FINDINGS Alignment: Several mm anterolisthesis C4-5 probably related to asymmetric facet DJD left worse than right. 1-2 mm anterolisthesis C5-6 with solid-appearing fusion across degenerated left facet. Skull base and vertebrae: Negative for fracture. No focal bone lesion. Asymmetric facet DJD left greater than right C2-C6. Soft tissues and spinal canal: No evident hematoma. No prevertebral soft tissue swelling. Bilateral carotid arterial calcifications. No acute findings. Disc levels: Mild narrowing of C5-6, moderate narrowing C6-7 and C7-T1 with anterior and  posterior endplate spurring. Upper chest: Subpleural blebs in both lung apices. No acute findings. Other: None IMPRESSION: 1. Negative for bleed or other acute intracranial process. 2. Negative for cervical fracture or other acute bone abnormality. 3. Stable ventriculostomy catheter without hydrocephalus. 4. Multilevel cervical spondylitic changes as above. Electronically Signed   By: Lucrezia Europe M.D.   On: 05/11/2019 13:45   Ct Cervical Spine Wo Contrast  Result Date: 05/11/2019 CLINICAL DATA:  Wife states has had some vagueness and confusion x 1 week. States is weaker than usual. Patient on 3l 02 due to history of pulmonary fibrosis. Patient can states first and last name however is quite lethargic sitting in wheelchair. TKV EXAM: CT HEAD WITHOUT CONTRAST CT CERVICAL SPINE WITHOUT CONTRAST TECHNIQUE: Multidetector CT imaging of the head and cervical spine was performed following the standard protocol without intravenous contrast. Multiplanar CT image reconstructions of the cervical spine were also generated. COMPARISON:  03/13/2015, 09/21/2009 FINDINGS: CT HEAD FINDINGS Brain: Left frontal ventriculostomy catheter extends with its tip just posterior to the right caudate nucleus as before. No hydrocephalus. Patchy areas of white matter attenuation most marked in the frontal lobes as before. Negative for acute intracranial hemorrhage, midline shift, focal parenchymal edema, mass,  or mass effect. Acute infarct may be inapparent on noncontrast CT. Vascular: No hyperdense vessel or unexpected calcification. Skull: Left ventriculostomy defect as before. Negative for fracture or focal lesion. Sinuses/Orbits: No acute finding. Other: None CT CERVICAL SPINE FINDINGS Alignment: Several mm anterolisthesis C4-5 probably related to asymmetric facet DJD left worse than right. 1-2 mm anterolisthesis C5-6 with solid-appearing fusion across degenerated left facet. Skull base and vertebrae: Negative for fracture. No focal bone  lesion. Asymmetric facet DJD left greater than right C2-C6. Soft tissues and spinal canal: No evident hematoma. No prevertebral soft tissue swelling. Bilateral carotid arterial calcifications. No acute findings. Disc levels: Mild narrowing of C5-6, moderate narrowing C6-7 and C7-T1 with anterior and posterior endplate spurring. Upper chest: Subpleural blebs in both lung apices. No acute findings. Other: None IMPRESSION: 1. Negative for bleed or other acute intracranial process. 2. Negative for cervical fracture or other acute bone abnormality. 3. Stable ventriculostomy catheter without hydrocephalus. 4. Multilevel cervical spondylitic changes as above. Electronically Signed   By: Lucrezia Europe M.D.   On: 05/11/2019 13:45    Review of Systems  Unable to perform ROS: Mental status change   Blood pressure 111/79, pulse 72, temperature 98.7 F (37.1 C), temperature source Oral, resp. rate 14, height 5\' 9"  (1.753 m), weight 65.6 kg, SpO2 97 %. Physical Exam  Constitutional: He appears well-developed and well-nourished. He appears lethargic.  HENT:  Head: Normocephalic and atraumatic.  Right Ear: External ear normal.  Left Ear: External ear normal.  Mouth/Throat: Oropharynx is clear and moist.  Eyes: Conjunctivae are normal. No scleral icterus. Pupils are unequal.  Strabismus on right, slight disconjugate gaze, chronic.  Neck: Neck supple. No JVD present. No tracheal deviation present. No thyromegaly present.  Cardiovascular: Normal rate, regular rhythm, normal heart sounds and intact distal pulses.  No murmur heard. Respiratory: Effort normal. No tachypnea. No respiratory distress. He has no wheezes. He has rhonchi.  Coarse breath sounds  GI: Soft. Bowel sounds are normal. He exhibits no distension.  Musculoskeletal: Normal range of motion.        General: No tenderness.  Lymphadenopathy:    He has no cervical adenopathy.  Neurological: He appears lethargic. He displays no tremor. He displays no  seizure activity.  Skin: Skin is warm, dry and intact.  Psychiatric:  Too lethargic to assess.    Assessment/Plan:  1.  Hyperosmolar non-ketotic state, initial glucose =883: Aggravated by chronic use of steroids in the setting of diabetes mellitus type 2.  Agree with IV fluid resuscitation, insulin infusion transition to subcu insulin as meets criteria.  Because of his issues with hyperglycemia, will decrease his current prednisone dose to 20 mg daily.  He has been on prednisone for a prolonged period of time and will need to be tapered off slowly.  2.  Acute metabolic encephalopathy likely secondary to the above: He does have a history of VP shunt, no evidence of hydrocephalus on CT scan of the head.  Continue to monitor, he is on chronic pain medications will hold if somnolent.  Needs aspiration and fall precautions.  3.  Biopsy-proven NSIP (2013), on chronic prednisone and Imuran: Most recent visit with Dr. Raul Del indicates that Imuran was to be switched for Esbriet due to hepatic dysfunction noted on Imuran.  Steroids were being decreased.  He is on chronic 3 L/min nasal cannula O2 mostly needed during exertion.  At rest he is well compensated.  4.  Chronic hypoxemic respiratory failure, no evidence of decompensation: Monitor, oxygen  as needed by nasal cannula to maintain sats of 90% or better.  5.  Chronic obstructive pulmonary disease complicating #3 above, no evidence of exacerbation: Bronchodilators as needed.  Pulmonary toilet.  6.  Hypothyroidism: Check TSH and free T4, he is on Armour Thyroid according to his wife due to difficulties obtaining control with Synthroid.  Continue same dose for now.  7.  Chronic pain syndrome on Nucynta and long-acting morphine: Hold medications if somnolent    C. Derrill Kay, MD [facility] 05/11/2019, 7:32 PM

## 2019-05-11 NOTE — ED Triage Notes (Signed)
Wife states has had some vagueness and confusion x 1 week. States is weaker than usual. Patient on 3l 02 due to history of pulmonary fibrosis. Patient can states first and last name however is quite lethargic sitting in wheelchair.

## 2019-05-11 NOTE — ED Notes (Signed)
Patient transported to CT 

## 2019-05-11 NOTE — ED Notes (Signed)
Attempted to call report

## 2019-05-11 NOTE — H&P (Addendum)
[facility] [place] NAME: Kirk Robinson    MR#:  [id number]  DATE OF BIRTH:  04-Jan-1956  DATE OF ADMISSION:  05/11/2019  PRIMARY CARE PHYSICIAN: [name], MD   REQUESTING/REFERRING PHYSICIAN: Guerry Minors Charline Bills, PA-C  CHIEF COMPLAINT:   Chief Complaint  Patient presents with   Altered Mental Status   Confusion for 1 week HISTORY OF PRESENT ILLNESS:  Kirk Robinson  is a 63 y.o. male with a known history as below.  The patient is confused, cannot provide any information.  According to his wife, the patient has a history of chronic respiratory failure on home oxygen 3 L, COPD, pulmonary fibrosis, hypertension, pneumonia, CVA, chronic pain syndrome and hydrocephalus etc.  The patient's condition has been declining for the past 3 months.  He lost weight for about 10 to 15 pounds for the past 2 months.  He was dehydrated and treated with IV fluid recently.  He has been confused for the past 1 week.  He is found high blood sugar 883 in ED.  AG is unremarkable.  Insulin is started.  ED PA request admission. PAST MEDICAL HISTORY:   Past Medical History:  Diagnosis Date   Alcohol use 03/16/2007   patient risk factors   Allergic rhinitis, cause unspecified    Altered mental status    Anxiety state, unspecified    Cervicalgia 09/17/2007   COPD (chronic obstructive pulmonary disease) (HCC)    Diabetes mellitus without complication ([place])    Diplopia 12/21/2007   Esophageal reflux    Gastritis 05/13/2009   HCAP (healthcare-associated pneumonia) 08/12/2014   Hematuria 03/16/2007   Hyperthyroidism    graves disease   Iodine hypothyroidism    Memory loss    NSIP (nonspecific interstitial pneumonia) ([place])    on azathioprine   Osteoporosis, unspecified    bone density per [facility] in 2012   Other abnormal glucose 04/08/2008   Other diseases of lung, not elsewhere classified 01/23/2009   s/p [place]/pulmonology  consult; intolerant  to Advair   Peripheral vascular disease, unspecified ([place]) 12/24/2009   Personal history of colonic polyps    Pneumonia, organism unspecified(486)    Pure hypercholesterolemia    Shortness of breath    Stroke ([place]) 2005   Tobacco use disorder 03/16/2007   patient risk factors   Unspecified hypothyroidism 01/07/2010   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  CT head negative, MRI brain negative, EEG: diffuse slowing but no  seizure acitivity.  Labs negative.  [facility].   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      SOCIAL HISTORY:   Social History   Tobacco Use   Smoking status: Current Every Day Smoker    Packs/day: 1.00    Years: 30.00    Pack years: 30.00   Smokeless tobacco: Never Used  Substance Use Topics   Alcohol use: No    FAMILY HISTORY:   Family History  Problem Relation Age  of Onset   Heart disease Father        cad, chf   Cancer Father    Heart disease Brother 70       CABG   Cancer Brother 44       pancreatic cancer   Diabetes Brother    Diabetes Brother    Heart disease Brother 5       CABG   Diabetes Brother    Heart disease Brother 38       CABG   Cancer Brother 57       prostate cancer   Heart disease Mother        cad   Diabetes Mother    Stroke Mother     DRUG ALLERGIES:   Allergies  Allergen Reactions   Advair Diskus  [Fluticasone-Salmeterol] Shortness Of Breath   Erythromycin Nausea And Vomiting   Iodinated Diagnostic Agents Shortness Of Breath   Ativan [Lorazepam] Other (See Comments)    Caused pt's heart to stop   Darvocet [Propoxyphene N-Acetaminophen] Nausea And Vomiting   Doxycycline Hyclate Nausea And Vomiting, Swelling and Other (See Comments)    Tongue swelling, severe depression   Propoxyphene Nausea And Vomiting   Septra [Sulfamethoxazole-Trimethoprim] Rash    REVIEW OF SYSTEMS:   Review of Systems  Unable to perform ROS: Mental status change    MEDICATIONS AT HOME:   Prior to Admission medications   Medication Sig Start Date End Date Taking? Authorizing Provider  albuterol (PROVENTIL HFA;VENTOLIN HFA) 108 (90 BASE) MCG/ACT inhaler Inhale 2 puffs into the lungs every 6 (six) hours as needed for wheezing or shortness of breath.    [provider]  albuterol (PROVENTIL) (2.5 MG/3ML) 0.083% nebulizer solution Take 2.5 mg by nebulization every 6 (six) hours as needed for wheezing or shortness of breath.    [provider]  atorvastatin (LIPITOR) 20 MG tablet Take 1 tablet (20 mg total) by mouth daily at 6 PM. 10/24/17   [name], MD  blood glucose meter kit and supplies KIT Check sugar once daily for Dx: E11.9 08/06/15   [name], MD  diazepam (VALIUM) 5 MG tablet Take 5 mg by mouth 2 (two) times daily.  04/25/14   [provider]  diltiazem (CARDIZEM CD) 120 MG 24 hr capsule TAKE 1 CAPSULE (120 MG TOTAL) BY MOUTH DAILY. 10/24/17   [name], MD  escitalopram (LEXAPRO) 10 MG tablet Take 1 tablet (10 mg total) by mouth daily. Office visit needed for refills 10/18/17   [name], MD  glucose blood test strip Check sugar once daily. Dx: E11.9 10/18/17   [name], MD  Lancets (FREESTYLE) lancets Check sugars once daily.  Dx: E11.9 10/18/17   [name], MD  metFORMIN (GLUCOPHAGE) 1000 MG tablet Take 1 tablet (1,000 mg total) by mouth  daily with breakfast. Needs office visit for any additional refills 10/18/17   [name], MD  metFORMIN (GLUCOPHAGE) 1000 MG tablet TAKE 1 TABLET BY MOUTH 2 TIMES DAILY WITH MEAL. 03/16/18   [name], MD  morphine (MS CONTIN) 60 MG 12 hr tablet Take 60 mg by mouth every 8 (eight) hours. 03/27/15   [provider]  omeprazole (PRILOSEC) 40 MG capsule TAKE 1 CAPSULE (40 MG TOTAL) BY MOUTH DAILY. 10/18/17   [name], MD  predniSONE (DELTASONE) 10 MG tablet Take 20 mg by mouth daily with breakfast.     [provider]  predniSONE (DELTASONE)  10 MG tablet TAKE 1 TABLET BY MOUTH EVERY DAY 11/13/17   [provider]  Tapentadol HCl (NUCYNTA) 75 MG TABS Take 1 tablet by mouth every 4 (four) hours as needed (pain).     [provider]  thyroid (ARMOUR) 90 MG tablet Take 90 mg by mouth daily at 2 PM daily at 2 PM.     [provider]  tiotropium (SPIRIVA) 18 MCG inhalation capsule Place 18 mcg into inhaler and inhale daily.    [provider]  venlafaxine XR (EFFEXOR-XR) 150 MG 24 hr capsule Take 1 capsule (150 mg total) by mouth daily with breakfast. 01/24/18   [name], MD  Vitamin D, Ergocalciferol, (DRISDOL) 50000 UNITS CAPS Take 50,000 Units by mouth every 7 (seven) days. On Wednesdays    [provider]  zoledronic acid (RECLAST) 5 MG/100ML SOLN injection Inject 5 mg into the vein See admin instructions. Given once per year    [provider]      VITAL SIGNS:  Blood pressure 125/76, pulse 75, temperature 98.1 F (36.7 C), temperature source Oral, resp. rate 15, height _0  (1.753 m), weight 68 kg, SpO2 96 %.  PHYSICAL EXAMINATION:  Physical Exam  GENERAL:  63 y.o.-year-old patient lying in the bed with no acute distress.  EYES: Pupils equal, round, reactive to light and accommodation. No scleral icterus. Extraocular muscles intact.  HEENT: Head atraumatic, normocephalic. NECK:  Supple, no jugular venous  distention. No thyroid enlargement, no tenderness.  LUNGS: Normal breath sounds bilaterally, no wheezing, rales,rhonchi or crepitation. No use of accessory muscles of respiration.  CARDIOVASCULAR: S1, S2 normal. No murmurs, rubs, or gallops.  ABDOMEN: Soft, nontender, nondistended. Bowel sounds present. No organomegaly or mass.  EXTREMITIES: No pedal edema, cyanosis, or clubbing.  NEUROLOGIC: The patient does not follow commands, unable to exam. PSYCHIATRIC: The patient is alert and oriented x 2.  SKIN: No obvious rash, lesion, or ulcer. Poor turgor.   LABORATORY PANEL:   CBC Recent Labs  Lab 05/11/19 1159  WBC 8.1  HGB 13.4  HCT 38.6*  PLT 234   ------------------------------------------------------------------------------------------------------------------  Chemistries  Recent Labs  Lab 05/11/19 1159  NA 135  K 4.0  CL 92*  CO2 30  GLUCOSE 883*  BUN 19  CREATININE 1.14  CALCIUM 10.3  AST 21  ALT 30  ALKPHOS 152*  BILITOT 0.7   ------------------------------------------------------------------------------------------------------------------  Cardiac Enzymes No results for input(s): TROPONINI in the last 168 hours. ------------------------------------------------------------------------------------------------------------------  RADIOLOGY:  Dg Chest 2 View  Result Date: 05/11/2019 CLINICAL DATA:  Shortness of breath EXAM: CHEST - 2 VIEW COMPARISON:  02/28/2019 FINDINGS: The heart size and mediastinal contours are within normal limits. Chronic coarsened interstitial markings with basilar predominant reticular opacities, left greater than right. No pleural effusion. No pneumothorax. Partially visualized shunt catheter descends the anterior left chest wall. IMPRESSION: Chronic findings of interstitial lung disease, grossly stable compared to prior chest CT. A superimposed infectious process would be difficult to exclude. Electronically Signed   By: Davina Poke M.D.    On: 05/11/2019 13:28   Ct Head Wo Contrast  Result Date: 05/11/2019 CLINICAL DATA:  Wife states has had some vagueness and confusion x 1 week. States is weaker than usual. Patient on 3l 02 due to history of pulmonary fibrosis. Patient can states first and last name however is quite lethargic sitting in wheelchair. TKV EXAM: CT HEAD WITHOUT CONTRAST CT CERVICAL SPINE WITHOUT CONTRAST TECHNIQUE: Multidetector CT imaging of the head and cervical  spine was performed following the standard protocol without intravenous contrast. Multiplanar CT image reconstructions of the cervical spine were also generated. COMPARISON:  03/13/2015, 09/21/2009 FINDINGS: CT HEAD FINDINGS Brain: Left frontal ventriculostomy catheter extends with its tip just posterior to the right caudate nucleus as before. No hydrocephalus. Patchy areas of white matter attenuation most marked in the frontal lobes as before. Negative for acute intracranial hemorrhage, midline shift, focal parenchymal edema, mass, or mass effect. Acute infarct may be inapparent on noncontrast CT. Vascular: No hyperdense vessel or unexpected calcification. Skull: Left ventriculostomy defect as before. Negative for fracture or focal lesion. Sinuses/Orbits: No acute finding. Other: None CT CERVICAL SPINE FINDINGS Alignment: Several mm anterolisthesis C4-5 probably related to asymmetric facet DJD left worse than right. 1-2 mm anterolisthesis C5-6 with solid-appearing fusion across degenerated left facet. Skull base and vertebrae: Negative for fracture. No focal bone lesion. Asymmetric facet DJD left greater than right C2-C6. Soft tissues and spinal canal: No evident hematoma. No prevertebral soft tissue swelling. Bilateral carotid arterial calcifications. No acute findings. Disc levels: Mild narrowing of C5-6, moderate narrowing C6-7 and C7-T1 with anterior and posterior endplate spurring. Upper chest: Subpleural blebs in both lung apices. No acute findings. Other: None  IMPRESSION: 1. Negative for bleed or other acute intracranial process. 2. Negative for cervical fracture or other acute bone abnormality. 3. Stable ventriculostomy catheter without hydrocephalus. 4. Multilevel cervical spondylitic changes as above. Electronically Signed   By: Lucrezia Europe M.D.   On: 05/11/2019 13:45   Ct Cervical Spine Wo Contrast  Result Date: 05/11/2019 CLINICAL DATA:  Wife states has had some vagueness and confusion x 1 week. States is weaker than usual. Patient on 3l 02 due to history of pulmonary fibrosis. Patient can states first and last name however is quite lethargic sitting in wheelchair. TKV EXAM: CT HEAD WITHOUT CONTRAST CT CERVICAL SPINE WITHOUT CONTRAST TECHNIQUE: Multidetector CT imaging of the head and cervical spine was performed following the standard protocol without intravenous contrast. Multiplanar CT image reconstructions of the cervical spine were also generated. COMPARISON:  03/13/2015, 09/21/2009 FINDINGS: CT HEAD FINDINGS Brain: Left frontal ventriculostomy catheter extends with its tip just posterior to the right caudate nucleus as before. No hydrocephalus. Patchy areas of white matter attenuation most marked in the frontal lobes as before. Negative for acute intracranial hemorrhage, midline shift, focal parenchymal edema, mass, or mass effect. Acute infarct may be inapparent on noncontrast CT. Vascular: No hyperdense vessel or unexpected calcification. Skull: Left ventriculostomy defect as before. Negative for fracture or focal lesion. Sinuses/Orbits: No acute finding. Other: None CT CERVICAL SPINE FINDINGS Alignment: Several mm anterolisthesis C4-5 probably related to asymmetric facet DJD left worse than right. 1-2 mm anterolisthesis C5-6 with solid-appearing fusion across degenerated left facet. Skull base and vertebrae: Negative for fracture. No focal bone lesion. Asymmetric facet DJD left greater than right C2-C6. Soft tissues and spinal canal: No evident hematoma.  No prevertebral soft tissue swelling. Bilateral carotid arterial calcifications. No acute findings. Disc levels: Mild narrowing of C5-6, moderate narrowing C6-7 and C7-T1 with anterior and posterior endplate spurring. Upper chest: Subpleural blebs in both lung apices. No acute findings. Other: None IMPRESSION: 1. Negative for bleed or other acute intracranial process. 2. Negative for cervical fracture or other acute bone abnormality. 3. Stable ventriculostomy catheter without hydrocephalus. 4. Multilevel cervical spondylitic changes as above. Electronically Signed   By: Lucrezia Europe M.D.   On: 05/11/2019 13:45      IMPRESSION AND PLAN:   Hyperosmolar non-ketotic  hyperglycemia due to diabetes 2. The patient will be admitted to stepdown unit. Continue insulin drip, hold metformin, IV fluid support.  Hold metformin.  Closely monitor blood glucose. Acute metabolic encephalopathy due to above. CT of head is unremarkable. Stable ventriculostomy catheter without hydrocephalus. Aspiration the fall precaution. Hypertension.  Continue hypertension medication. Chronic respiratory failure with COPD and pulmonary fibrosis. Continue oxygen by nasal cannula, NEB and prednisone. Chronic pain syndrome continue home medication.  All the records are reviewed and case discussed with ED provider. Management plans discussed with the patient's wife and they are in agreement.  CODE STATUS: DNR.  TOTAL CRITICAL TIME TAKING CARE OF THIS PATIENT: 55 minutes.    Demetrios Loll M.D on 05/11/2019 at 3:41 PM  Between 7am to 6pm - Pager - [phone]  After 6pm go to [email or web address] - Proofreader  [facility]  Office  [phone]  CC: Primary care physician; [name], MD   Note: This dictation was prepared with Dragon dictation along with smaller phrase technology. Any transcriptional errors that result from this process are unin

## 2019-05-11 NOTE — Progress Notes (Signed)
Pt arrived to unit Alert and oriented to place and self with no c/o of pain. Pt has remained in NSR on cardiac monitor, BP/HR  Remaining WNL. Pt has remained on baseline 3LNC, spO2 greater than 96 and above. Lung sounds w/ coarse crackles bilaterally to upper lobes. No distress noted. Pt remains on insulin drip he has transitioned to D5 1/2 NS. Wife visited at bedside and updated by Dr. Duwayne Heck.

## 2019-05-11 NOTE — ED Provider Notes (Signed)
[facility] Emergency Department Provider Note  ____________________________________________  Time seen: Approximately 12:33 PM  I have reviewed the triage vital signs and the nursing notes.   HISTORY  Chief Complaint Altered Mental Status  Level 5 caveat: Wife provides all of history   HPI Kirk Robinson is a 63 y.o. male who presents the emergency department with his wife for complaint of increasing altered mental status, weakness, increasing shortness of breath.  According to the wife, patient has a significant medical history.  The wife provides all the history at this time.  Patient has a history of COPD, diabetes, gastritis, pulmonary fibrosis with multiple episodes of community-acquired pneumonia, hypothyroidism, CVA, hydrocephalus.  Per the wife, the patient has had declining health over the past 3 months.  However the past 2 weeks have seen the most significant decline.  Patient does have mild hypoxia with ambulation.  Patient chronically wears 3 L of O2 for his pulmonary fibrosis.  Patient has been seeing his pulmonologist weekly over the past several weeks.  Patient also has intermittent periods where he will not eat or drink.  The patient's wife states that if "food and drink do not sound good, he just refuses."  According to the wife, the patient was clinically dehydrated earlier this week, received IV fluids at home.  Wife is concerned as patient continues to have a decline.  He is lost approximately 15 pounds over the past 2 months.  The wife reports that the patient will ambulate at home, did sustain a fall 2 days ago striking his head.  Patient did not lose consciousness during that fall.  He has not been complaining of headache or neck pain.  According to the wife, the patient has had no complaints but she is primarily concerned given his increasing altered mental status, as well as generalized failing health.  Patient wife reports that with his history of  pulmonary fibrosis he has had multiple rounds of pneumonia.  Patient has not been running a fever or showing signs of chills.  No significant coughing.  Patient has baseline shortness of breath and baseline hypoxia with ambulation.  This is not grossly changed from patient's baseline.  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  It has been clogged in the past, but symptoms are not consistent with today.         Past Medical History:  Diagnosis Date  . Alcohol use 03/16/2007   patient risk factors  . Allergic rhinitis, cause unspecified   . Altered mental status   . Anxiety state, unspecified   . Cervicalgia 09/17/2007  . COPD (chronic obstructive pulmonary disease) ([place])   . Diabetes mellitus without complication ([place])   . Diplopia 12/21/2007  . Esophageal reflux   . Gastritis 05/13/2009  . HCAP (healthcare-associated pneumonia) 08/12/2014  . Hematuria 03/16/2007  . Hyperthyroidism    graves disease  . Iodine hypothyroidism   . Memory loss   . NSIP (nonspecific interstitial pneumonia) ([employer])    on azathioprine  . Osteoporosis, unspecified    bone density per Morivati in 2012  . Other abnormal glucose 04/08/2008  . Other diseases of lung, not elsewhere classified 01/23/2009   s/p [place]/pulmonology consult; intolerant  to Advair  . Peripheral vascular disease, unspecified ([place]) 12/24/2009  . Personal history of colonic polyps   . Pneumonia, organism unspecified(486)   . Pure hypercholesterolemia   . Shortness of breath   . Stroke ([place]) 2005  . Tobacco use disorder 03/16/2007   patient  risk factors  . Unspecified hypothyroidism 01/07/2010  . 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 Type 2 diabetes mellitus without complication, without long-term current use of insulin ([place]) 03/19/2017  . Anxiety and depression 03/19/2017  . Amnesia 03/19/2017  . Malnutrition of moderate degree ([place]) 03/19/2015   . Atrial fibrillation ([place])   . Hydrocephalus in adult [facility]) 05/18/2014  . 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 Chronic pain syndrome 05/11/2014  . Pulmonary fibrosis ([place]) 01/14/2013  . GERD (gastroesophageal reflux disease) 01/14/2013  . Pure hypercholesterolemia 01/14/2013  . Hyperlipidemia 05/30/2012  . Depression 05/30/2012  . COPD (chronic obstructive pulmonary disease) ([place]) 05/30/2012  . Degenerative disc disease 05/30/2012  . Hypothyroidism following radioiodine therapy 05/30/2012  . Osteoporosis 05/30/2012  . Peripheral vascular disease ([place]) 05/30/2012  . Tobacco abuse 05/30/2012  . Adult hypothyroidism 01/07/2010  . History of colon polyps 09/30/2008  . 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 Admission 11/2011     altered mental status/confusion with syncope.  CT head negative, MRI brain negative, EEG: diffuse slowing but no  seizure acitivity.  Labs negative.  [facility].  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 EYE SURGERY  2010  . LAPAROSCOPIC REVISION VENTRICULAR-PERITONEAL (V-P) SHUNT Right 05/20/2014   Procedure: LAPAROSCOPIC REVISION VENTRICULAR-PERITONEAL (V-P) SHUNT;  Surgeon: Georganna Skeans, MD;  Location: [facility];  Service: General;  Laterality: Right;  . Neuropsychiatric evaluation  04/2012   poor short-term memory, poor recall, delayed reaction time; permanently disabled.      Prior to Admission medications   Medication Sig Start Date End Date Taking? Authorizing Provider  albuterol (PROVENTIL HFA;VENTOLIN HFA) 108 (90 BASE) MCG/ACT inhaler Inhale 2 puffs into the lungs every 6 (six) hours as needed for wheezing or shortness of breath.    [provider]  albuterol (PROVENTIL) (2.5 MG/3ML) 0.083% nebulizer solution Take 2.5 mg by nebulization every 6 (six) hours as needed for wheezing or shortness of breath.    [provider]  atorvastatin (LIPITOR) 20 MG  tablet Take 1 tablet (20 mg total) by mouth daily at 6 PM. 10/24/17   [name], MD  blood glucose meter kit and supplies KIT Check sugar once daily for Dx: E11.9 08/06/15   [name], MD  diazepam (VALIUM) 5 MG tablet Take 5 mg by mouth 2 (two) times daily.  04/25/14   [provider]  diltiazem (CARDIZEM CD) 120 MG 24 hr capsule TAKE 1 CAPSULE (120 MG TOTAL) BY MOUTH DAILY. 10/24/17   [name], MD  escitalopram (LEXAPRO) 10 MG tablet Take 1 tablet (10 mg total) by mouth daily. Office visit needed for refills 10/18/17   [name], MD  glucose blood test strip Check sugar once daily. Dx: E11.9 10/18/17   [name], MD  Lancets (FREESTYLE) lancets Check sugars once daily.  Dx: E11.9 10/18/17   [name], MD  metFORMIN (GLUCOPHAGE) 1000 MG tablet Take 1 tablet (1,000 mg total) by mouth daily with breakfast. Needs office visit for any additional refills 10/18/17   [name], MD  metFORMIN (GLUCOPHAGE) 1000 MG tablet TAKE 1 TABLET BY MOUTH 2 TIMES DAILY WITH MEAL. 03/16/18   [name], MD  morphine (MS CONTIN) 60 MG 12 hr tablet Take 60 mg by mouth every 8 (eight) hours.  03/27/15   [provider]  omeprazole (PRILOSEC) 40 MG capsule TAKE 1 CAPSULE (40 MG TOTAL) BY MOUTH DAILY. 10/18/17   [name], MD  predniSONE (DELTASONE) 10 MG tablet Take 20 mg by mouth daily with breakfast.     [provider]  predniSONE (DELTASONE) 10 MG tablet TAKE 1 TABLET BY MOUTH EVERY DAY 11/13/17   [provider]  Tapentadol HCl (NUCYNTA) 75 MG TABS Take 1 tablet by mouth every 4 (four) hours as needed (pain).     [provider]  thyroid (ARMOUR) 90 MG tablet Take 90 mg by mouth daily at 2 PM daily at 2 PM.     [provider]  tiotropium (SPIRIVA) 18 MCG inhalation capsule Place 18 mcg into inhaler and inhale daily.    [provider]  venlafaxine XR (EFFEXOR-XR) 150 MG 24 hr capsule Take 1 capsule (150 mg total) by  mouth daily with breakfast. 01/24/18   [name], MD  Vitamin D, Ergocalciferol, (DRISDOL) 50000 UNITS CAPS Take 50,000 Units by mouth every 7 (seven) days. On Wednesdays    [provider]  zoledronic acid (RECLAST) 5 MG/100ML SOLN injection Inject 5 mg into the vein See admin instructions. Given once per year    [provider]    Allergies Advair diskus [fluticasone-salmeterol], Erythromycin, Iodinated diagnostic agents, Ativan [lorazepam], Darvocet [propoxyphene n-acetaminophen], Doxycycline hyclate, Propoxyphene, and Septra [sulfamethoxazole-trimethoprim]  Family History  Problem Relation Age of Onset  . Heart disease Father        cad, chf  . Cancer Father   . Heart disease Brother 40       CABG  . Cancer Brother 67       pancreatic cancer  . Diabetes Brother   . Diabetes Brother   . Heart disease Brother 55       CABG  . Diabetes Brother   . Heart disease Brother 58       CABG  . Cancer Brother 78       prostate cancer  . Heart disease Mother        cad  . Diabetes Mother   . Stroke Mother     Social History Social History   Tobacco Use  . Smoking status: Current Every Day Smoker    Packs/day: 1.00    Years: 30.00    Pack years: 30.00  . Smokeless tobacco: Never Used  Substance Use Topics  . Alcohol use: No  . Drug use: No     Review of Systems provided by wife.  Patient noncontributory Constitutional: No fever/chills.  Increased confusion. Eyes: No visual changes. No discharge ENT: No upper respiratory complaints. Cardiovascular: no chest pain. Respiratory: no cough.  Chronic shortness of breath.  At baseline.  Chronic hypoxia with ambulation. Gastrointestinal: No abdominal pain.  No nausea, no vomiting.  No diarrhea.  No constipation. Genitourinary: Negative for dysuria. No hematuria Musculoskeletal: Negative for musculoskeletal pain. Skin: Negative for rash, abrasions, lacerations, ecchymosis. Neurological: Negative for  headaches, focal weakness or numbness. 10-point ROS otherwise negative.  ____________________________________________   PHYSICAL EXAM:  VITAL SIGNS: ED Triage Vitals  Enc Vitals Group     BP 05/11/19 1135 (!) 128/94     Pulse Rate 05/11/19 1135 (!) 114     Resp 05/11/19 1135 20     Temp 05/11/19 1135 98.1 F (36.7 C)     Temp Source 05/11/19 1135 Oral     SpO2 05/11/19 1135 95 %  Weight 05/11/19 1136 150 lb (68 kg)     Height 05/11/19 1136 5' 9" (1.753 m)     Head Circumference --      Peak Flow --      Pain Score 05/11/19 1135 0     Pain Loc --      Pain Edu? --      Excl. in [name]? --      Constitutional: Alert but not oriented.  Patient does know his name.  Patient does not respond to questioning.  Moderately ill appearing but in no acute distress. Eyes: Conjunctivae are normal.  Pupils are unequal.  According to wife his baseline.  Pupils are slow to react, according to wife this is baseline after patient's previous CVA.  EOMI. Head: Atraumatic. ENT:      Ears:       Nose: No congestion/rhinnorhea.      Mouth/Throat: Mucous membranes are moist.  Neck: No stridor.  No apparent tenderness to palpation along the cervical spine.  No palpable abnormality.  No visible signs of trauma. Hematological/Lymphatic/Immunilogical: No cervical lymphadenopathy. Cardiovascular: Normal rate, regular rhythm. Normal S1 and S2.  Good peripheral circulation. Respiratory: Normal respiratory effort without tachypnea or retractions. Lungs with scattered crackles, intermittent wheezes.Kermit Balo air entry to the bases with no decreased or absent breath sounds. Gastrointestinal: Bowel sounds 4 quadrants. Soft and nontender to palpation. No guarding or rigidity. No palpable masses. No distention. No CVA tenderness. Musculoskeletal: Full range of motion to all extremities. No gross deformities appreciated. Neurologic: Patient mostly nonverbal during exam.  No gross focal neurologic deficits are  appreciated.  Skin:  Skin is warm, dry and intact. No rash noted. Psychiatric: Mood and affect are normal.  Patient primarily nonverbal during exam.  Oriented to self only.  No insight or judgment at this time.   ____________________________________________   LABS (all labs ordered are listed, but only abnormal results are displayed)  Labs Reviewed  URINALYSIS, COMPLETE (UACMP) WITH MICROSCOPIC - Abnormal; Notable for the following components:      Result Value   Color, Urine STRAW (*)    APPearance CLEAR (*)    Specific Gravity, Urine 1.031 (*)    Glucose, UA >=500 (*)    Hgb urine dipstick SMALL (*)    All other components within normal limits  COMPREHENSIVE METABOLIC PANEL - Abnormal; Notable for the following components:   Chloride 92 (*)    Glucose, Bld 883 (*)    Alkaline Phosphatase 152 (*)    All other components within normal limits  CBC WITH DIFFERENTIAL/PLATELET - Abnormal; Notable for the following components:   HCT 38.6 (*)    RDW 15.6 (*)    Lymphs Abs 0.5 (*)    Abs Immature Granulocytes 0.26 (*)    All other components within normal limits  GLUCOSE, CAPILLARY - Abnormal; Notable for the following components:   Glucose-Capillary >600 (*)    All other components within normal limits  BLOOD GAS, VENOUS - Abnormal; Notable for the following components:   pH, Ven 7.47 (*)    pO2, Ven 83.0 (*)    Bicarbonate 38.6 (*)    Acid-Base Excess 12.7 (*)    All other components within normal limits  GLUCOSE, CAPILLARY - Abnormal; Notable for the following components:   Glucose-Capillary 458 (*)    All other components within normal limits  SARS CORONAVIRUS 2 (TAT 6-24 HRS)  CBG MONITORING, ED   ____________________________________________  EKG   ____________________________________________  RADIOLOGY I personally viewed  and evaluated these images as part of my medical decision making, as well as reviewing the written report by the radiologist.  Dg Chest 2  View  Result Date: 05/11/2019 CLINICAL DATA:  Shortness of breath EXAM: CHEST - 2 VIEW COMPARISON:  02/28/2019 FINDINGS: The heart size and mediastinal contours are within normal limits. Chronic coarsened interstitial markings with basilar predominant reticular opacities, left greater than right. No pleural effusion. No pneumothorax. Partially visualized shunt catheter descends the anterior left chest wall. IMPRESSION: Chronic findings of interstitial lung disease, grossly stable compared to prior chest CT. A superimposed infectious process would be difficult to exclude. Electronically Signed   By: Davina Poke M.D.   On: 05/11/2019 13:28   Ct Head Wo Contrast  Result Date: 05/11/2019 CLINICAL DATA:  Wife states has had some vagueness and confusion x 1 week. States is weaker than usual. Patient on 3l 02 due to history of pulmonary fibrosis. Patient can states first and last name however is quite lethargic sitting in wheelchair. TKV EXAM: CT HEAD WITHOUT CONTRAST CT CERVICAL SPINE WITHOUT CONTRAST TECHNIQUE: Multidetector CT imaging of the head and cervical spine was performed following the standard protocol without intravenous contrast. Multiplanar CT image reconstructions of the cervical spine were also generated. COMPARISON:  03/13/2015, 09/21/2009 FINDINGS: CT HEAD FINDINGS Brain: Left frontal ventriculostomy catheter extends with its tip just posterior to the right caudate nucleus as before. No hydrocephalus. Patchy areas of white matter attenuation most marked in the frontal lobes as before. Negative for acute intracranial hemorrhage, midline shift, focal parenchymal edema, mass, or mass effect. Acute infarct may be inapparent on noncontrast CT. Vascular: No hyperdense vessel or unexpected calcification. Skull: Left ventriculostomy defect as before. Negative for fracture or focal lesion. Sinuses/Orbits: No acute finding. Other: None CT CERVICAL SPINE FINDINGS Alignment: Several mm anterolisthesis C4-5  probably related to asymmetric facet DJD left worse than right. 1-2 mm anterolisthesis C5-6 with solid-appearing fusion across degenerated left facet. Skull base and vertebrae: Negative for fracture. No focal bone lesion. Asymmetric facet DJD left greater than right C2-C6. Soft tissues and spinal canal: No evident hematoma. No prevertebral soft tissue swelling. Bilateral carotid arterial calcifications. No acute findings. Disc levels: Mild narrowing of C5-6, moderate narrowing C6-7 and C7-T1 with anterior and posterior endplate spurring. Upper chest: Subpleural blebs in both lung apices. No acute findings. Other: None IMPRESSION: 1. Negative for bleed or other acute intracranial process. 2. Negative for cervical fracture or other acute bone abnormality. 3. Stable ventriculostomy catheter without hydrocephalus. 4. Multilevel cervical spondylitic changes as above. Electronically Signed   By: Lucrezia Europe M.D.   On: 05/11/2019 13:45   Ct Cervical Spine Wo Contrast  Result Date: 05/11/2019 CLINICAL DATA:  Wife states has had some vagueness and confusion x 1 week. States is weaker than usual. Patient on 3l 02 due to history of pulmonary fibrosis. Patient can states first and last name however is quite lethargic sitting in wheelchair. TKV EXAM: CT HEAD WITHOUT CONTRAST CT CERVICAL SPINE WITHOUT CONTRAST TECHNIQUE: Multidetector CT imaging of the head and cervical spine was performed following the standard protocol without intravenous contrast. Multiplanar CT image reconstructions of the cervical spine were also generated. COMPARISON:  03/13/2015, 09/21/2009 FINDINGS: CT HEAD FINDINGS Brain: Left frontal ventriculostomy catheter extends with its tip just posterior to the right caudate nucleus as before. No hydrocephalus. Patchy areas of white matter attenuation most marked in the frontal lobes as before. Negative for acute intracranial hemorrhage, midline shift, focal parenchymal edema, mass, or  mass effect. Acute  infarct may be inapparent on noncontrast CT. Vascular: No hyperdense vessel or unexpected calcification. Skull: Left ventriculostomy defect as before. Negative for fracture or focal lesion. Sinuses/Orbits: No acute finding. Other: None CT CERVICAL SPINE FINDINGS Alignment: Several mm anterolisthesis C4-5 probably related to asymmetric facet DJD left worse than right. 1-2 mm anterolisthesis C5-6 with solid-appearing fusion across degenerated left facet. Skull base and vertebrae: Negative for fracture. No focal bone lesion. Asymmetric facet DJD left greater than right C2-C6. Soft tissues and spinal canal: No evident hematoma. No prevertebral soft tissue swelling. Bilateral carotid arterial calcifications. No acute findings. Disc levels: Mild narrowing of C5-6, moderate narrowing C6-7 and C7-T1 with anterior and posterior endplate spurring. Upper chest: Subpleural blebs in both lung apices. No acute findings. Other: None IMPRESSION: 1. Negative for bleed or other acute intracranial process. 2. Negative for cervical fracture or other acute bone abnormality. 3. Stable ventriculostomy catheter without hydrocephalus. 4. Multilevel cervical spondylitic changes as above. Electronically Signed   By: Lucrezia Europe M.D.   On: 05/11/2019 13:45    ____________________________________________    PROCEDURES  Procedure(s) performed:    Procedures    Medications  sodium chloride flush (NS) 0.9 % injection 3 mL (3 mLs Intravenous Not Given 05/11/19 1209)  insulin regular, human (MYXREDLIN) 100 units/ 100 mL infusion (5.4 Units/hr Intravenous New Bag/Given 05/11/19 1403)  sodium chloride 0.9 % bolus 1,000 mL (0 mLs Intravenous Stopped 05/11/19 1502)    And  sodium chloride 0.9 % bolus 1,000 mL (1,000 mLs Intravenous New Bag/Given 05/11/19 1407)    And  0.9 %  sodium chloride infusion (has no administration in time range)  potassium chloride 10 mEq in 100 mL IVPB (0 mEq Intravenous Stopped 05/11/19 1511)      ____________________________________________   INITIAL IMPRESSION / ASSESSMENT AND PLAN / ED COURSE  Pertinent labs & imaging results that were available during my care of the patient were reviewed by me and considered in my medical decision making (see chart for details).  Review of the [place] CSRS was performed in accordance of the [place] prior to dispensing any controlled drugs.           Patient's diagnosis is consistent with hyperglycemia with hyperosmolarity from type 2 diabetes, confusion, unintentional weight loss, pulmonary fibrosis.  Patient presented to the emergency department with altered mental status, weakness.  Patient has a pretty significant medical history.  According to the patient's wife the patient had been declining over the past 3 months with significant change 2 weeks ago and even worsening change over the last 48 hours.  Patient was noncontributory to exam and was unable to provide information.  Patient will respond to his name but otherwise would not participate in conversation.  Patient was evaluated with CT head and neck after reported fall 2 days ago, patient had chest x-ray for reported shortness of breath and hypoxia which appears to be baseline.  Labs revealed that patient had a blood glucose of 883.  Patient was started on fluids, insulin and potassium.  While on this treatment, patient's glucose did improve to 458.  Patient is still very confused and lethargic but patient will engage in some conversation with reassessment.  Labs are otherwise reassuring.  I discussed with the hospitalist for admission and hospitalist service accepts.    ____________________________________________  FINAL CLINICAL IMPRESSION(S) / ED DIAGNOSES  Final diagnoses:  Type 2 diabetes mellitus with hyperosmolarity without nonketotic hyperglycemic-hyperosmolar coma [facility]) (HCC)  Confusion  Unintentional weight loss  Pulmonary fibrosis ([place])      NEW MEDICATIONS STARTED  DURING THIS VISIT:  ED Discharge Orders    None          This chart was dictated using voice recognition software/Dragon. Despite best efforts to proofread, errors can occur which can change the meaning. Any change was purely unintentional.    Darletta Moll, PA-C 05/11/19 1522    Carrie Mew, MD 05/12/19 [phone]

## 2019-05-12 DIAGNOSIS — G9340 Encephalopathy, unspecified: Secondary | ICD-10-CM

## 2019-05-12 LAB — BASIC METABOLIC PANEL
Anion gap: 7 (ref 5–15)
Anion gap: 8 (ref 5–15)
Anion gap: 10 (ref 5–15)
BUN: 16 mg/dL (ref 8–23)
BUN: 15 mg/dL (ref 8–23)
BUN: 8 mg/dL (ref 8–23)
CO2: 31 mmol/L (ref 22–32)
CO2: 31 mmol/L (ref 22–32)
CO2: 27 mmol/L (ref 22–32)
Calcium: 8.6 mg/dL — ABNORMAL LOW (ref 8.9–10.3)
Calcium: 8.6 mg/dL — ABNORMAL LOW (ref 8.9–10.3)
Calcium: 8.7 mg/dL — ABNORMAL LOW (ref 8.9–10.3)
Chloride: 104 mmol/L (ref 98–111)
Chloride: 103 mmol/L (ref 98–111)
Chloride: 101 mmol/L (ref 98–111)
Creatinine, Ser: 0.63 mg/dL (ref 0.61–1.24)
Creatinine, Ser: 0.58 mg/dL — ABNORMAL LOW (ref 0.61–1.24)
Creatinine, Ser: 0.85 mg/dL (ref 0.61–1.24)
GFR calc Af Amer: >60 mL/min (ref >60)
GFR calc Af Amer: >60 mL/min (ref >60)
GFR calc Af Amer: >60 mL/min (ref >60)
GFR calc non Af Amer: >60 mL/min (ref >60)
GFR calc non Af Amer: >60 mL/min (ref >60)
GFR calc non Af Amer: >60 mL/min (ref >60)
Glucose, Bld: 156 mg/dL — ABNORMAL HIGH (ref 70–99)
Glucose, Bld: 179 mg/dL — ABNORMAL HIGH (ref 70–99)
Glucose, Bld: 174 mg/dL — ABNORMAL HIGH (ref 70–99)
Potassium: 3.9 mmol/L (ref 3.5–5.1)
Potassium: 3.4 mmol/L — ABNORMAL LOW (ref 3.5–5.1)
Potassium: 3.6 mmol/L (ref 3.5–5.1)
Sodium: 142 mmol/L (ref 135–145)
Sodium: 142 mmol/L (ref 135–145)
Sodium: 138 mmol/L (ref 135–145)

## 2019-05-12 LAB — CBC
HCT: 35 % — ABNORMAL LOW (ref 39.0–52.0)
Hemoglobin: 12.2 g/dL — ABNORMAL LOW (ref 13.0–17.0)
MCH: 30.5 pg (ref 26.0–34.0)
MCHC: 34.9 g/dL (ref 30.0–36.0)
MCV: 87.5 fL (ref 80.0–100.0)
Platelets: 193 10*3/uL (ref 150–400)
RBC: 4 MIL/uL — ABNORMAL LOW (ref 4.22–5.81)
RDW: 15.9 % — ABNORMAL HIGH (ref 11.5–15.5)
WBC: 8.1 10*3/uL (ref 4.0–10.5)
nRBC: 0 % (ref 0.0–0.2)

## 2019-05-12 LAB — GLUCOSE, CAPILLARY
Glucose-Capillary: 113 mg/dL — ABNORMAL HIGH (ref 70–99)
Glucose-Capillary: 118 mg/dL — ABNORMAL HIGH (ref 70–99)
Glucose-Capillary: 123 mg/dL — ABNORMAL HIGH (ref 70–99)
Glucose-Capillary: 135 mg/dL — ABNORMAL HIGH (ref 70–99)
Glucose-Capillary: 145 mg/dL — ABNORMAL HIGH (ref 70–99)
Glucose-Capillary: 151 mg/dL — ABNORMAL HIGH (ref 70–99)
Glucose-Capillary: 152 mg/dL — ABNORMAL HIGH (ref 70–99)
Glucose-Capillary: 153 mg/dL — ABNORMAL HIGH (ref 70–99)
Glucose-Capillary: 154 mg/dL — ABNORMAL HIGH (ref 70–99)
Glucose-Capillary: 156 mg/dL — ABNORMAL HIGH (ref 70–99)
Glucose-Capillary: 165 mg/dL — ABNORMAL HIGH (ref 70–99)
Glucose-Capillary: 166 mg/dL — ABNORMAL HIGH (ref 70–99)
Glucose-Capillary: 168 mg/dL — ABNORMAL HIGH (ref 70–99)
Glucose-Capillary: 169 mg/dL — ABNORMAL HIGH (ref 70–99)
Glucose-Capillary: 170 mg/dL — ABNORMAL HIGH (ref 70–99)
Glucose-Capillary: 176 mg/dL — ABNORMAL HIGH (ref 70–99)
Glucose-Capillary: 177 mg/dL — ABNORMAL HIGH (ref 70–99)
Glucose-Capillary: 182 mg/dL — ABNORMAL HIGH (ref 70–99)
Glucose-Capillary: 187 mg/dL — ABNORMAL HIGH (ref 70–99)
Glucose-Capillary: 204 mg/dL — ABNORMAL HIGH (ref 70–99)
Glucose-Capillary: 231 mg/dL — ABNORMAL HIGH (ref 70–99)
Glucose-Capillary: 371 mg/dL — ABNORMAL HIGH (ref 70–99)

## 2019-05-12 LAB — MAGNESIUM
Magnesium: 1.9 mg/dL (ref 1.7–2.4)
Magnesium: 1.9 mg/dL (ref 1.7–2.4)
Magnesium: 1.8 mg/dL (ref 1.7–2.4)

## 2019-05-12 LAB — T4, FREE: Free T4: 0.62 ng/dL (ref 0.61–1.12)

## 2019-05-12 LAB — PHOSPHORUS
Phosphorus: 2.9 mg/dL (ref 2.5–4.6)
Phosphorus: 2.1 mg/dL — ABNORMAL LOW (ref 2.5–4.6)
Phosphorus: 2.1 mg/dL — ABNORMAL LOW (ref 2.5–4.6)

## 2019-05-12 LAB — SARS CORONAVIRUS 2 (TAT 6-24 HRS): SARS Coronavirus 2: NEGATIVE

## 2019-05-12 LAB — TSH: TSH: 0.509 u[IU]/mL (ref 0.350–4.500)

## 2019-05-12 MED ORDER — INSULIN GLARGINE 100 UNIT/ML ~~LOC~~ SOLN
10.0000 [IU] | Freq: Every day | SUBCUTANEOUS | Status: DC
  Filled 2019-05-12 (×2): qty 0.1

## 2019-05-12 MED ORDER — LEVOTHYROXINE SODIUM 100 MCG/5ML IV SOLN
75.0000 ug | Freq: Every day | INTRAVENOUS | Status: DC
  Administered 2019-05-12 – 2019-05-13 (×2): 75 ug via INTRAVENOUS
  Filled 2019-05-12 (×3): qty 5

## 2019-05-12 MED ORDER — POTASSIUM PHOSPHATES 15 MMOLE/5ML IV SOLN
30.0000 mmol | Freq: Once | INTRAVENOUS | Status: AC
  Administered 2019-05-13: 30 mmol via INTRAVENOUS
  Filled 2019-05-12: qty 10

## 2019-05-12 MED ORDER — THIAMINE HCL 100 MG/ML IJ SOLN
100.0000 mg | Freq: Every day | INTRAMUSCULAR | Status: DC
  Administered 2019-05-12 – 2019-05-13 (×2): 100 mg via INTRAVENOUS
  Filled 2019-05-12 (×2): qty 2

## 2019-05-12 MED ORDER — INSULIN ASPART 100 UNIT/ML ~~LOC~~ SOLN: 0.0000 [IU] | Freq: Three times a day (TID) | SUBCUTANEOUS | Status: DC

## 2019-05-12 MED ORDER — INSULIN ASPART 100 UNIT/ML ~~LOC~~ SOLN
0.0000 [IU] | SUBCUTANEOUS | Status: DC
  Administered 2019-05-12 – 2019-05-13 (×4): 3 [IU] via SUBCUTANEOUS
  Administered 2019-05-13: 2 [IU] via SUBCUTANEOUS
  Administered 2019-05-13 (×2): 5 [IU] via SUBCUTANEOUS
  Administered 2019-05-14: 3 [IU] via SUBCUTANEOUS
  Administered 2019-05-14: 2 [IU] via SUBCUTANEOUS
  Administered 2019-05-14 (×2): 3 [IU] via SUBCUTANEOUS
  Filled 2019-05-12 (×11): qty 1

## 2019-05-12 MED ORDER — MORPHINE SULFATE (PF) 2 MG/ML IV SOLN
2.0000 mg | INTRAVENOUS | Status: DC | PRN
  Administered 2019-05-12: 2 mg via INTRAVENOUS
  Administered 2019-05-12: 1 mg via INTRAVENOUS
  Filled 2019-05-12 (×2): qty 1

## 2019-05-12 MED ORDER — INFLUENZA VAC SPLIT QUAD 0.5 ML IM SUSY
0.5000 mL | PREFILLED_SYRINGE | INTRAMUSCULAR | Status: AC
  Administered 2019-05-13: 0.5 mL via INTRAMUSCULAR
  Filled 2019-05-12: qty 0.5

## 2019-05-12 MED ORDER — INSULIN GLARGINE 100 UNIT/ML ~~LOC~~ SOLN
10.0000 [IU] | Freq: Every day | SUBCUTANEOUS | Status: DC
  Administered 2019-05-12 – 2019-05-14 (×3): 10 [IU] via SUBCUTANEOUS
  Filled 2019-05-12 (×3): qty 0.1

## 2019-05-12 MED ORDER — POTASSIUM CHLORIDE CRYS ER 20 MEQ PO TBCR
40.0000 meq | EXTENDED_RELEASE_TABLET | Freq: Once | ORAL | Status: AC
  Administered 2019-05-12: 40 meq via ORAL
  Filled 2019-05-12: qty 2

## 2019-05-12 MED ORDER — ACETAMINOPHEN 325 MG PO TABS
650.0000 mg | ORAL_TABLET | Freq: Four times a day (QID) | ORAL | Status: DC | PRN
  Filled 2019-05-12 (×2): qty 2

## 2019-05-12 NOTE — Progress Notes (Signed)
[facility] NAME: Kirk Robinson    MR#:  [id number]  DATE OF BIRTH:  08/25/1955  SUBJECTIVE:   Patient presented to the hospital due to altered mental status and also noted to be severely hyperglycemic with nonketotic hyperosmolar state.  Still remains quite altered.  Blood sugars much improved and patient is presently still on a insulin drip.  REVIEW OF SYSTEMS:    Review of Systems  Unable to perform ROS: Mental acuity    Nutrition: NPO Tolerating Diet: No Tolerating PT: Await Eval.   DRUG ALLERGIES:   Allergies  Allergen Reactions  . Advair Diskus [Fluticasone-Salmeterol] Shortness Of Breath  . Erythromycin Nausea And Vomiting  . Iodinated Diagnostic Agents Shortness Of Breath  . Ativan [Lorazepam] Other (See Comments)    Caused pt's heart to stop  . Darvocet [Propoxyphene N-Acetaminophen] Nausea And Vomiting  . Doxycycline Hyclate Nausea And Vomiting, Swelling and Other (See Comments)    Tongue swelling, severe depression  . Propoxyphene Nausea And Vomiting  . Septra [Sulfamethoxazole-Trimethoprim] Rash    VITALS:  Blood pressure 111/78, pulse 92, temperature 98.2 F (36.8 C), temperature source Axillary, resp. rate (!) 21, height 5\' 9"  (1.753 m), weight 65.6 kg, SpO2 94 %.  PHYSICAL EXAMINATION:   Physical Exam  GENERAL:  63 y.o.-year-old patient lying in bed altered in NAD.  EYES: Pupils equal, round, reactive to light and accommodation. No scleral icterus. Extraocular muscles intact.  HEENT: Head atraumatic, normocephalic. Dry Oral Mucoas NECK:  Supple, no jugular venous distention. No thyroid enlargement, no tenderness.  LUNGS: Poor Resp. effort, no wheezing, rales, rhonchi. No use of accessory muscles of respiration.  CARDIOVASCULAR: S1, S2 normal. No murmurs, rubs, or gallops.  ABDOMEN: Soft, nontender, nondistended. Bowel sounds present. No organomegaly or mass.  EXTREMITIES: No cyanosis, clubbing  or edema b/l.    NEUROLOGIC: Cranial nerves II through XII are intact. No focal Motor or sensory deficits b/l.  Globally weak & Encephalopathic.  PSYCHIATRIC: The patient is alert and oriented x 1.  SKIN: No obvious rash, lesion, or ulcer.    LABORATORY PANEL:   CBC Recent Labs  Lab 05/12/19 0400  WBC 8.1  HGB 12.2*  HCT 35.0*  PLT 193   ------------------------------------------------------------------------------------------------------------------  Chemistries  Recent Labs  Lab 05/11/19 1159  05/12/19 0400  NA 135   < > 142  K 4.0   < > 3.4*  CL 92*   < > 103  CO2 30   < > 31  GLUCOSE 883*   < > 179*  BUN 19   < > 15  CREATININE 1.14   < > 0.58*  CALCIUM 10.3   < > 8.6*  MG  --    < > 1.9  AST 21  --   --   ALT 30  --   --   ALKPHOS 152*  --   --   BILITOT 0.7  --   --    < > = values in this interval not displayed.   ------------------------------------------------------------------------------------------------------------------  Cardiac Enzymes No results for input(s): TROPONINI in the last 168 hours. ------------------------------------------------------------------------------------------------------------------  RADIOLOGY:  Dg Chest 2 View  Result Date: 05/11/2019 CLINICAL DATA:  Shortness of breath EXAM: CHEST - 2 VIEW COMPARISON:  02/28/2019 FINDINGS: The heart size and mediastinal contours are within normal limits. Chronic coarsened interstitial markings with basilar predominant reticular opacities, left greater than right. No pleural effusion. No pneumothorax. Partially visualized shunt catheter descends  the anterior left chest wall. IMPRESSION: Chronic findings of interstitial lung disease, grossly stable compared to prior chest CT. A superimposed infectious process would be difficult to exclude. Electronically Signed   By: Davina Poke M.D.   On: 05/11/2019 13:28   Ct Head Wo Contrast  Result Date: 05/11/2019 CLINICAL DATA:  Wife states has had  some vagueness and confusion x 1 week. States is weaker than usual. Patient on 3l 02 due to history of pulmonary fibrosis. Patient can states first and last name however is quite lethargic sitting in wheelchair. TKV EXAM: CT HEAD WITHOUT CONTRAST CT CERVICAL SPINE WITHOUT CONTRAST TECHNIQUE: Multidetector CT imaging of the head and cervical spine was performed following the standard protocol without intravenous contrast. Multiplanar CT image reconstructions of the cervical spine were also generated. COMPARISON:  03/13/2015, 09/21/2009 FINDINGS: CT HEAD FINDINGS Brain: Left frontal ventriculostomy catheter extends with its tip just posterior to the right caudate nucleus as before. No hydrocephalus. Patchy areas of white matter attenuation most marked in the frontal lobes as before. Negative for acute intracranial hemorrhage, midline shift, focal parenchymal edema, mass, or mass effect. Acute infarct may be inapparent on noncontrast CT. Vascular: No hyperdense vessel or unexpected calcification. Skull: Left ventriculostomy defect as before. Negative for fracture or focal lesion. Sinuses/Orbits: No acute finding. Other: None CT CERVICAL SPINE FINDINGS Alignment: Several mm anterolisthesis C4-5 probably related to asymmetric facet DJD left worse than right. 1-2 mm anterolisthesis C5-6 with solid-appearing fusion across degenerated left facet. Skull base and vertebrae: Negative for fracture. No focal bone lesion. Asymmetric facet DJD left greater than right C2-C6. Soft tissues and spinal canal: No evident hematoma. No prevertebral soft tissue swelling. Bilateral carotid arterial calcifications. No acute findings. Disc levels: Mild narrowing of C5-6, moderate narrowing C6-7 and C7-T1 with anterior and posterior endplate spurring. Upper chest: Subpleural blebs in both lung apices. No acute findings. Other: None IMPRESSION: 1. Negative for bleed or other acute intracranial process. 2. Negative for cervical fracture or other  acute bone abnormality. 3. Stable ventriculostomy catheter without hydrocephalus. 4. Multilevel cervical spondylitic changes as above. Electronically Signed   By: Lucrezia Europe M.D.   On: 05/11/2019 13:45   Ct Cervical Spine Wo Contrast  Result Date: 05/11/2019 CLINICAL DATA:  Wife states has had some vagueness and confusion x 1 week. States is weaker than usual. Patient on 3l 02 due to history of pulmonary fibrosis. Patient can states first and last name however is quite lethargic sitting in wheelchair. TKV EXAM: CT HEAD WITHOUT CONTRAST CT CERVICAL SPINE WITHOUT CONTRAST TECHNIQUE: Multidetector CT imaging of the head and cervical spine was performed following the standard protocol without intravenous contrast. Multiplanar CT image reconstructions of the cervical spine were also generated. COMPARISON:  03/13/2015, 09/21/2009 FINDINGS: CT HEAD FINDINGS Brain: Left frontal ventriculostomy catheter extends with its tip just posterior to the right caudate nucleus as before. No hydrocephalus. Patchy areas of white matter attenuation most marked in the frontal lobes as before. Negative for acute intracranial hemorrhage, midline shift, focal parenchymal edema, mass, or mass effect. Acute infarct may be inapparent on noncontrast CT. Vascular: No hyperdense vessel or unexpected calcification. Skull: Left ventriculostomy defect as before. Negative for fracture or focal lesion. Sinuses/Orbits: No acute finding. Other: None CT CERVICAL SPINE FINDINGS Alignment: Several mm anterolisthesis C4-5 probably related to asymmetric facet DJD left worse than right. 1-2 mm anterolisthesis C5-6 with solid-appearing fusion across degenerated left facet. Skull base and vertebrae: Negative for fracture. No focal bone lesion. Asymmetric facet  DJD left greater than right C2-C6. Soft tissues and spinal canal: No evident hematoma. No prevertebral soft tissue swelling. Bilateral carotid arterial calcifications. No acute findings. Disc levels:  Mild narrowing of C5-6, moderate narrowing C6-7 and C7-T1 with anterior and posterior endplate spurring. Upper chest: Subpleural blebs in both lung apices. No acute findings. Other: None IMPRESSION: 1. Negative for bleed or other acute intracranial process. 2. Negative for cervical fracture or other acute bone abnormality. 3. Stable ventriculostomy catheter without hydrocephalus. 4. Multilevel cervical spondylitic changes as above. Electronically Signed   By: Lucrezia Europe M.D.   On: 05/11/2019 13:45     ASSESSMENT AND PLAN:   63 year old male with past medical history of previous CVA, hypothyroidism, GERD, diabetes, COPD, anxiety/depression, history of alcohol abuse who presented to the hospital due to altered mental status and noted to be in nonketotic hyperosmolar state.  1.  Altered mental status/encephalopathy- this is likely metabolic in nature related to underlying hyperglycemia and also possible underlying dementia. - Patient's remains quite altered.  No other acute infectious source noted.  CT head was negative admission. -Continue to follow mental status.  2.  Nonketotic hyper glycemia- patient presently remains on insulin drip. - Blood sugars are improving.  We will continue to monitor.  Continue IV fluids.  3.  Hyperlipidemia-continue atorvastatin.  4.  Essential hypertension-continue Cardizem  5.  Chronic pain-continue Nucynta, MS Contin.  6.  Hypothyroidism-continue Armour Thyroid.  7.  Depression-continue Effexor.    All the records are reviewed and case discussed with Care Management/Social Worker. Management plans discussed with the patient, family and they are in agreement.  CODE STATUS: DNR  DVT Prophylaxis: Lovenox  TOTAL TIME TAKING CARE OF THIS PATIENT: 30 minutes.   POSSIBLE D/C IN 3-4 DAYS, DEPENDING ON CLINICAL CONDITION.   Henreitta Leber M.D on 05/12/2019 at 1:19 PM  Between 7am to 6pm - Pager - [phone]  After 6pm go to [email or web address] - Microbiologist  [facility]  Office  [phone]  CC: Primary care physician; [name], MD

## 2019-05-12 NOTE — Progress Notes (Signed)
In beginning of shift pt AOx2, pt progressively has changed to disoriented x4. Pt has had episodes of twitching on either side of body along with episodes of staring. Dr. Patsey Berthold was updated and consulted with pt and his wife present. Pt has remained in NSR on cardiac monitor and HR WNL, BP has run slightly low a few times throughout shift, no intervention required.  D/C pts 3Lo2 and Pt has maintained a pox of 93% and above on RA. Lung sounds w/ coarse crackles bilaterally to lower lobes with periods of apneic breathing. No distress noted. Pt remains on insulin drip but is transitioning off to a sliding scale per order. Wife visited at bedside and updated by Dr. Duwayne Heck.

## 2019-05-12 NOTE — Progress Notes (Signed)
Follow up - Critical Care Medicine Note  Patient Details:    Kirk Robinson is an 63 y.o. 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  Admitted to stepdown for insulin infusion administration.  Lines, Airways, Drains: External Urinary Catheter (Active)  Collection Container Standard drainage bag 05/12/19 1400  Securement Method Securing device (Describe) 05/12/19 1400  Output (mL) 400 mL 05/12/19 1400    Anti-infectives:  Anti-infectives (From admission, onward)   None      Microbiology: Results for orders placed or performed during the hospital encounter of 05/11/19  SARS CORONAVIRUS 2 (TAT 6-24 HRS) Nasopharyngeal Nasopharyngeal Swab     Status: None   Collection Time: 05/11/19 12:56 PM   Specimen: Nasopharyngeal Swab  Result Value Ref Range Status   SARS Coronavirus 2 NEGATIVE NEGATIVE Final    Comment: (NOTE) SARS-CoV-2 target nucleic acids are NOT DETECTED. The SARS-CoV-2 RNA is generally detectable in upper and lower respiratory specimens during the acute phase of infection. Negative results do not preclude SARS-CoV-2 infection, do not rule out co-infections with other pathogens, and should not be used as the sole basis for treatment or other patient management decisions. Negative results must be combined with clinical observations, patient history, and epidemiological information. The expected result is Negative. Fact Sheet for Patients: SugarRoll.be Fact Sheet for Healthcare Providers: https://www.woods-mathews.com/ This test is not yet approved or cleared by the [place] FDA and  has been authorized for detection and/or diagnosis of SARS-CoV-2 by FDA under an Emergency Use Authorization (EUA). This EUA will remain  in effect (meaning this test can be used) for  the duration of the COVID-19 declaration under Section 56 4(b)(1) of the Act, 21 U.S.C. section 360bbb-3(b)(1), unless the authorization is terminated or revoked sooner. Performed at [facility], [place] [address] [place], [place] [address]   MRSA PCR Screening     Status: None   Collection Time: 05/11/19  4:55 PM   Specimen: Nasopharyngeal  Result Value Ref Range Status   MRSA by PCR NEGATIVE NEGATIVE Final    Comment:        The GeneXpert MRSA Assay (FDA approved for NASAL specimens only), is one component of a comprehensive MRSA colonization surveillance program. It is not intended to diagnose MRSA infection nor to guide or monitor treatment for MRSA infections. Performed at [facility], [address] [place], [place] [address]     Best Practice/Protocols:  VTE Prophylaxis: Lovenox (prophylaxtic dose) Hyperglycemia/ICU  Events: 09/19 admitted with altered mental status and hyperosmolar nonketotic state 09/19 CT scan of the head without evidence of hydrocephalus 09/20 glycemic control improving, still with altered mental status  Studies: Dg Chest 2 View  Result Date: 05/11/2019 CLINICAL DATA:  Shortness of breath EXAM: CHEST - 2 VIEW COMPARISON:  02/28/2019 FINDINGS: The heart size and mediastinal contours are within normal limits. Chronic coarsened interstitial markings with basilar predominant reticular opacities, left greater than right. No pleural effusion. No pneumothorax. Partially visualized shunt catheter descends the anterior left chest wall. IMPRESSION: Chronic findings of interstitial lung disease, grossly stable compared to prior chest CT. A superimposed infectious process would be difficult to exclude. Electronically Signed   By: Davina Poke M.D.   On: 05/11/2019 13:28   Ct Head Wo Contrast  Result Date: 05/11/2019 CLINICAL DATA:  Wife states has had some vagueness and confusion x 1 week.  States is weaker than usual. Patient on 3l 02  due to history of pulmonary fibrosis. Patient can states first and last name however is quite lethargic sitting in wheelchair. TKV EXAM: CT HEAD WITHOUT CONTRAST CT CERVICAL SPINE WITHOUT CONTRAST TECHNIQUE: Multidetector CT imaging of the head and cervical spine was performed following the standard protocol without intravenous contrast. Multiplanar CT image reconstructions of the cervical spine were also generated. COMPARISON:  03/13/2015, 09/21/2009 FINDINGS: CT HEAD FINDINGS Brain: Left frontal ventriculostomy catheter extends with its tip just posterior to the right caudate nucleus as before. No hydrocephalus. Patchy areas of white matter attenuation most marked in the frontal lobes as before. Negative for acute intracranial hemorrhage, midline shift, focal parenchymal edema, mass, or mass effect. Acute infarct may be inapparent on noncontrast CT. Vascular: No hyperdense vessel or unexpected calcification. Skull: Left ventriculostomy defect as before. Negative for fracture or focal lesion. Sinuses/Orbits: No acute finding. Other: None CT CERVICAL SPINE FINDINGS Alignment: Several mm anterolisthesis C4-5 probably related to asymmetric facet DJD left worse than right. 1-2 mm anterolisthesis C5-6 with solid-appearing fusion across degenerated left facet. Skull base and vertebrae: Negative for fracture. No focal bone lesion. Asymmetric facet DJD left greater than right C2-C6. Soft tissues and spinal canal: No evident hematoma. No prevertebral soft tissue swelling. Bilateral carotid arterial calcifications. No acute findings. Disc levels: Mild narrowing of C5-6, moderate narrowing C6-7 and C7-T1 with anterior and posterior endplate spurring. Upper chest: Subpleural blebs in both lung apices. No acute findings. Other: None IMPRESSION: 1. Negative for bleed or other acute intracranial process. 2. Negative for cervical fracture or other acute bone abnormality. 3. Stable ventriculostomy catheter without hydrocephalus. 4.  Multilevel cervical spondylitic changes as above. Electronically Signed   By: Lucrezia Europe M.D.   On: 05/11/2019 13:45   Ct Cervical Spine Wo Contrast  Result Date: 05/11/2019 CLINICAL DATA:  Wife states has had some vagueness and confusion x 1 week. States is weaker than usual. Patient on 3l 02 due to history of pulmonary fibrosis. Patient can states first and last name however is quite lethargic sitting in wheelchair. TKV EXAM: CT HEAD WITHOUT CONTRAST CT CERVICAL SPINE WITHOUT CONTRAST TECHNIQUE: Multidetector CT imaging of the head and cervical spine was performed following the standard protocol without intravenous contrast. Multiplanar CT image reconstructions of the cervical spine were also generated. COMPARISON:  03/13/2015, 09/21/2009 FINDINGS: CT HEAD FINDINGS Brain: Left frontal ventriculostomy catheter extends with its tip just posterior to the right caudate nucleus as before. No hydrocephalus. Patchy areas of white matter attenuation most marked in the frontal lobes as before. Negative for acute intracranial hemorrhage, midline shift, focal parenchymal edema, mass, or mass effect. Acute infarct may be inapparent on noncontrast CT. Vascular: No hyperdense vessel or unexpected calcification. Skull: Left ventriculostomy defect as before. Negative for fracture or focal lesion. Sinuses/Orbits: No acute finding. Other: None CT CERVICAL SPINE FINDINGS Alignment: Several mm anterolisthesis C4-5 probably related to asymmetric facet DJD left worse than right. 1-2 mm anterolisthesis C5-6 with solid-appearing fusion across degenerated left facet. Skull base and vertebrae: Negative for fracture. No focal bone lesion. Asymmetric facet DJD left greater than right C2-C6. Soft tissues and spinal canal: No evident hematoma. No prevertebral soft tissue swelling. Bilateral carotid arterial calcifications. No acute findings. Disc levels: Mild narrowing of C5-6, moderate narrowing C6-7 and C7-T1 with anterior and posterior  endplate spurring. Upper chest: Subpleural blebs in both lung apices. No acute findings. Other: None IMPRESSION: 1. Negative for bleed or other acute intracranial  process. 2. Negative for cervical fracture or other acute bone abnormality. 3. Stable ventriculostomy catheter without hydrocephalus. 4. Multilevel cervical spondylitic changes as above. Electronically Signed   By: Lucrezia Europe M.D.   On: 05/11/2019 13:45    Consults:    Subjective:    Overnight Issues: No issues overnight.  Occasionally more interactive but still very lethargic and sleeping for the most part.  Some episodes of upper arm twitching noted by nurse.  Wife states that she has noted some nystagmus on occasion prior to admission.  Objective:  Vital signs for last 24 hours: Temp:  [98.2 F (36.8 C)-98.9 F (37.2 C)] 98.2 F (36.8 C) (09/20 1400) Pulse Rate:  [57-92] 85 (09/20 1400) Resp:  [9-21] 11 (09/20 1400) BP: (92-141)/(64-81) 92/77 (09/20 1400) SpO2:  [94 %-100 %] 94 % (09/20 1400) Weight:  [65.6 kg] 65.6 kg (09/19 1650)  Hemodynamic parameters for last 24 hours:    Intake/Output from previous day: 09/19 0701 - 09/20 0700 In: 4391 [I.V.:1291; IV Piggyback:3100] Out: 1420 [Urine:1420]  Intake/Output this shift: Total I/O In: 8.3 [I.V.:8.3] Out: 400 [Urine:400]  Vent settings for last 24 hours:    Physical Exam:  Physical Exam  Constitutional: well-developed and well-nourished.Lethargic.  HENT: Normocephalic and atraumatic.  Mouth/Throat: Oropharynx is clear and moist.  Eyes: Conjunctivae are normal. No scleral icterus. Pupils are unequal.  Strabismus on right, slight disconjugate gaze, chronic.  Neck: Supple. No JVD. No tracheal deviation. No thyromegaly.  Cardiovascular: Normal rate, regular rhythm, normal heart sounds and intact distal pulses.  No murmur heard. Respiratory: Effort normal. No tachypnea. No respiratory distress. Few wheezes. He has rhonchi.  Coarse breath sounds  GI: Soft. Bowel  sounds are normal. No distension.  Musculoskeletal: Normal range of motion.     Neurological: He appears lethargic. No tremor.  No overt seizure activity.  Nursing does has noted occasional rhythmic tremor left upper extremity. Skin: Skin is warm, dry and intact.  Psychiatric: Too lethargic to assess.   Overall blunted affect  Assessment/Plan:   1.  Hyperosmolar non-ketotic state, initial glucose = 883: Aggravated by chronic use of steroids in the setting of diabetes mellitus type 2.  Transitioning to subcu Lantus and sliding scale.  Diabetes coordinator consult .  Because of his issues with hyperglycemia, will decrease his current prednisone dose to 20 mg daily.  He has been on prednisone for a prolonged period of time and will need to be tapered off slowly to avoid adrenal crisis.  2.  Acute metabolic encephalopathy likely secondary to the above: He does have a history of VP shunt, no evidence of hydrocephalus on CT scan of the head.  Continue to monitor, he is on chronic pain medications and anxiolytics will hold if somnolent.  Needs aspiration and fall precautions.  Persistent encephalopathy a concern for the patient's wife, he has been declining worse  a week prior to admission.  Will obtain EEG due to possible localized tremor (query subclinical seizures), obtain neurology consult for a.m.  3.  Biopsy-proven NSIP (2013), on chronic prednisone and Imuran: Most recent visit with Dr. Raul Del indicates that Imuran was to be switched for Esbriet due to hepatic dysfunction noted on Imuran.  He had not started Esbriet yet. Steroids were being decreased.  He is on chronic 3 L/min nasal cannula O2 mostly needed during exertion.  At rest he is well compensated.  Currently well compensated on room air.  4.  Chronic hypoxemic respiratory failure, no evidence of decompensation: Monitor, oxygen as  needed by nasal cannula to maintain sats of 90% or better.  Currently well compensated on room air.  5.   Chronic obstructive pulmonary disease complicating #3 above, no evidence of exacerbation: Bronchodilators as needed.  Pulmonary toilet.  6.  Hypothyroidism: Check TSH and free T4, he is on Armour Thyroid according to his wife due to difficulties obtaining control with Synthroid.  Free T4 was somewhat low.  He is unable to take p.o. reliably we will switch to Synthroid IV until able to take p.o. reliably.  Check also free T3.  7.  Chronic pain syndrome on Nucynta and long-acting morphine: Hold medications if somnolent    LOS: 1 day   Additional comments: Updated wife at bedside.  Patient is DNR, status confirmed.  Critical Care Total Time*:   C. Derrill Kay, MD [facility] 05/12/2019  *Care during the described time interval was provided by me and/or other providers on the critical care team.  I have reviewed this patient's available data, including medical history, events of note, physical examination and test results as part of my evaluation.

## 2019-05-13 ENCOUNTER — Other Ambulatory Visit: Payer: BC Managed Care – PPO

## 2019-05-13 ENCOUNTER — Inpatient Hospital Stay: Payer: BC Managed Care – PPO

## 2019-05-13 DIAGNOSIS — R410 Disorientation, unspecified: Secondary | ICD-10-CM

## 2019-05-13 LAB — CBC
HCT: 41 % (ref 39.0–52.0)
Hemoglobin: 14.6 g/dL (ref 13.0–17.0)
MCH: 30.7 pg (ref 26.0–34.0)
MCHC: 35.6 g/dL (ref 30.0–36.0)
MCV: 86.3 fL (ref 80.0–100.0)
Platelets: 267 10*3/uL (ref 150–400)
RBC: 4.75 MIL/uL (ref 4.22–5.81)
RDW: 15.7 % — ABNORMAL HIGH (ref 11.5–15.5)
WBC: 9.9 10*3/uL (ref 4.0–10.5)
nRBC: 0 % (ref 0.0–0.2)

## 2019-05-13 LAB — GLUCOSE, CAPILLARY
Glucose-Capillary: 194 mg/dL — ABNORMAL HIGH (ref 70–99)
Glucose-Capillary: 201 mg/dL — ABNORMAL HIGH (ref 70–99)
Glucose-Capillary: 190 mg/dL — ABNORMAL HIGH (ref 70–99)
Glucose-Capillary: 242 mg/dL — ABNORMAL HIGH (ref 70–99)
Glucose-Capillary: 141 mg/dL — ABNORMAL HIGH (ref 70–99)

## 2019-05-13 LAB — AMMONIA: Ammonia: 20 umol/L (ref 9–35)

## 2019-05-13 LAB — BASIC METABOLIC PANEL
Anion gap: 11 (ref 5–15)
BUN: 8 mg/dL (ref 8–23)
CO2: 24 mmol/L (ref 22–32)
Calcium: 8.9 mg/dL (ref 8.9–10.3)
Chloride: 98 mmol/L (ref 98–111)
Creatinine, Ser: 0.65 mg/dL (ref 0.61–1.24)
GFR calc Af Amer: >60 mL/min (ref >60)
GFR calc non Af Amer: >60 mL/min (ref >60)
Glucose, Bld: 148 mg/dL — ABNORMAL HIGH (ref 70–99)
Potassium: 3.8 mmol/L (ref 3.5–5.1)
Sodium: 133 mmol/L — ABNORMAL LOW (ref 135–145)

## 2019-05-13 LAB — PROCALCITONIN: Procalcitonin: <0.1 ng/mL

## 2019-05-13 LAB — PHOSPHORUS: Phosphorus: 4.1 mg/dL (ref 2.5–4.6)

## 2019-05-13 LAB — MAGNESIUM: Magnesium: 1.7 mg/dL (ref 1.7–2.4)

## 2019-05-13 MED ORDER — INSULIN STARTER KIT- PEN NEEDLES (ENGLISH)
1.0000 | Freq: Once | Status: AC
  Administered 2019-05-13: 1
  Filled 2019-05-13: qty 1

## 2019-05-13 MED ORDER — LIVING WELL WITH DIABETES BOOK
Freq: Once | Status: AC
  Administered 2019-05-13: 10:00:00
  Filled 2019-05-13: qty 1

## 2019-05-13 MED ORDER — MORPHINE SULFATE ER 15 MG PO TBCR
30.0000 mg | EXTENDED_RELEASE_TABLET | Freq: Three times a day (TID) | ORAL | Status: DC
  Administered 2019-05-13 – 2019-05-18 (×12): 30 mg via ORAL
  Filled 2019-05-13 (×12): qty 2

## 2019-05-13 NOTE — Progress Notes (Signed)
Inpatient Diabetes Program Recommendations  AACE/ADA: New Consensus Statement on Inpatient Glycemic Control (2015)  Target Ranges:  Prepandial:   less than 140 mg/dL      Peak postprandial:   less than 180 mg/dL (1-2 hours)      Critically ill patients:  140 - 180 mg/dL   Lab Results  Component Value Date   GLUCAP 201 (H) 05/13/2019   HGBA1C 13.2 (H) 05/11/2019    Review of Glycemic Control  Diabetes history: DM2 Outpatient Diabetes medications: Metformin 1 gm bid Current orders for Inpatient glycemic control: Lantus 10 units + Novolog moderate correction q 4 hrs.  Inpatient Diabetes Program Recommendations:   Noted A1c 13.2 and has been on steroids @ home. Ordered Living Well With Diabetes Book and insulin starter kit to prepare for going home on insulin if ordered. Will plan to speak with patient when appropriate. Nurses, please teach patient insulin administration and allow patient to start giving injections as appropriate.  Will follow during hospitalization.  Thank you, Nani Gasser. Cherylene Ferrufino, RN, MSN, CDE  Diabetes Coordinator Inpatient Glycemic Control Team Team Pager [phone] (8am-5pm) 05/13/2019 9:19 AM

## 2019-05-13 NOTE — Progress Notes (Signed)
eeg completed ° °

## 2019-05-13 NOTE — Consult Note (Addendum)
Reason for Consult:AMS Referring Physician: Patsey Berthold  CC: AMS   HPI: Kirk Robinson is an 63 y.o. male with multiple medical problems including hemorrhagic infarct s/p shunt placement admitted with hyperosmolar non-ketotic hyperglycemia on 9/19 and placed on an insulin infusion.  Patient unable to provide any history therefore all history obtained from the chart.  It appears that for the past 3 months the patient's condition has been declining.  He has lost 10-15 pounds over the past 2 months.  Has been confused for the past week.  Admitted with a blood glucose of 883.    Past Medical History:  Diagnosis Date  . Alcohol use 03/16/2007   patient risk factors  . Allergic rhinitis, cause unspecified   . Altered mental status   . Anxiety state, unspecified   . Cervicalgia 09/17/2007  . COPD (chronic obstructive pulmonary disease) ([place])   . Diabetes mellitus without complication ([place])   . Diplopia 12/21/2007  . Esophageal reflux   . Gastritis 05/13/2009  . HCAP (healthcare-associated pneumonia) 08/12/2014  . Hematuria 03/16/2007  . Hyperthyroidism    graves disease  . Iodine hypothyroidism   . Memory loss   . NSIP (nonspecific interstitial pneumonia) ([place])    on azathioprine  . Osteoporosis, unspecified    bone density per [facility] in 2012  . Other abnormal glucose 04/08/2008  . Other diseases of lung, not elsewhere classified 01/23/2009   s/p Fleming/pulmonology consult; intolerant  to Advair  . Peripheral vascular disease, unspecified ([place]) 12/24/2009  . Personal history of colonic polyps   . Pneumonia, organism unspecified(486)   . Pure hypercholesterolemia   . Shortness of breath   . Stroke ([place]) 2005  . Tobacco use disorder 03/16/2007   patient risk factors  . Unspecified hypothyroidism 01/07/2010  . 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 Admission 11/2011     altered mental  status/confusion with syncope.  CT head negative, MRI brain negative, EEG: diffuse slowing but no  seizure acitivity.  Labs negative.  [facility].  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 EYE SURGERY  2010  . LAPAROSCOPIC REVISION VENTRICULAR-PERITONEAL (V-P) SHUNT Right 05/20/2014   Procedure: LAPAROSCOPIC REVISION VENTRICULAR-PERITONEAL (V-P) SHUNT;  Surgeon: Georganna Skeans, MD;  Location: [facility];  Service: General;  Laterality: Right;  . Neuropsychiatric evaluation  04/2012   poor short-term memory, poor recall, delayed reaction time; permanently disabled.      Family History  Problem Relation Age of Onset  . Heart disease Father        cad, chf  . Cancer Father   . Heart disease Brother 29       CABG  . Cancer Brother 34       pancreatic cancer  . Diabetes Brother   . Diabetes Brother   . Heart disease Brother 60       CABG  . Diabetes Brother   . Heart disease Brother 52       CABG  . Cancer Brother 42       prostate cancer  . Heart disease Mother        cad  . Diabetes Mother   . Stroke Mother     Social History:  reports that he has been smoking. He has a 30.00 pack-year smoking history. He has never used smokeless tobacco. He reports that he does not drink alcohol or use drugs.  Allergies  Allergen Reactions  .  Advair Diskus [Fluticasone-Salmeterol] Shortness Of Breath  . Erythromycin Nausea And Vomiting  . Iodinated Diagnostic Agents Shortness Of Breath  . Ativan [Lorazepam] Other (See Comments)    Caused pt's heart to stop  . Darvocet [Propoxyphene N-Acetaminophen] Nausea And Vomiting  . Doxycycline Hyclate Nausea And Vomiting, Swelling and Other (See Comments)    Tongue swelling, severe depression  . Propoxyphene Nausea And Vomiting  . Septra [Sulfamethoxazole-Trimethoprim] Rash    Medications:  I have reviewed the patient's current medications. Prior to Admission:  Medications Prior to Admission  Medication Sig Dispense Refill Last Dose  .  albuterol (PROVENTIL HFA;VENTOLIN HFA) 108 (90 BASE) MCG/ACT inhaler Inhale 2 puffs into the lungs every 6 (six) hours as needed for wheezing or shortness of breath.   Unknown at PRN  . atorvastatin (LIPITOR) 20 MG tablet Take 1 tablet (20 mg total) by mouth daily at 6 PM. 90 tablet 3 05/10/2019 at 1900  . blood glucose meter kit and supplies KIT Check sugar once daily for Dx: E11.9 1 each 0   . diazepam (VALIUM) 5 MG tablet Take 5 mg by mouth 2 (two) times daily.    05/11/2019 at 1000  . diltiazem (CARDIZEM CD) 120 MG 24 hr capsule TAKE 1 CAPSULE (120 MG TOTAL) BY MOUTH DAILY. (Patient taking differently: Take 120 mg by mouth daily. ) 90 capsule 1 05/11/2019 at 1000  . glucose blood test strip Check sugar once daily. Dx: E11.9 100 each 3   . Lancets (FREESTYLE) lancets Check sugars once daily.  Dx: E11.9 100 each 3   . metFORMIN (GLUCOPHAGE) 1000 MG tablet TAKE 1 TABLET BY MOUTH 2 TIMES DAILY WITH MEAL. (Patient taking differently: Take 1,000 mg by mouth 2 (two) times daily with a meal. ) 180 tablet 0 05/11/2019 at 1000  . morphine (MS CONTIN) 60 MG 12 hr tablet Take 60 mg by mouth every 8 (eight) hours.  0 05/11/2019 at 1000  . predniSONE (DELTASONE) 10 MG tablet Take 20 mg by mouth daily with breakfast.    05/11/2019 at 1000  . predniSONE (DELTASONE) 10 MG tablet Take 10 mg by mouth every evening.    05/10/2019 at 1900  . Tapentadol HCl (NUCYNTA) 75 MG TABS Take 75 mg by mouth every 4 (four) hours as needed for moderate pain or severe pain.    Past Week at PRN  . thyroid (ARMOUR) 90 MG tablet Take 90 mg by mouth daily at 2 PM daily at 2 PM.    05/10/2019 at 1400  . venlafaxine XR (EFFEXOR-XR) 150 MG 24 hr capsule Take 1 capsule (150 mg total) by mouth daily with breakfast. 90 capsule 1 05/11/2019 at 1000  . escitalopram (LEXAPRO) 10 MG tablet Take 1 tablet (10 mg total) by mouth daily. Office visit needed for refills (Patient not taking: Reported on 05/11/2019) 90 tablet 0 Not Taking at Unknown time  .  omeprazole (PRILOSEC) 40 MG capsule TAKE 1 CAPSULE (40 MG TOTAL) BY MOUTH DAILY. (Patient not taking: Reported on 05/11/2019) 90 capsule 3 Not Taking at Unknown time   Scheduled: . atorvastatin  20 mg Oral q1800  . Chlorhexidine Gluconate Cloth  6 each Topical Daily  . diazepam  5 mg Oral BID  . diltiazem  120 mg Oral Daily  . enoxaparin (LOVENOX) injection  40 mg Subcutaneous Q24H  . insulin aspart  0-15 Units Subcutaneous Q4H  . insulin glargine  10 Units Subcutaneous QHS  . levothyroxine  75 mcg Intravenous Daily  . morphine  60 mg Oral Q8H  . pantoprazole  40 mg Oral Daily  . predniSONE  20 mg Oral Q breakfast  . sodium chloride flush  3 mL Intravenous Once  . tapentadol HCl  75 mg Oral Q4H  . thiamine injection  100 mg Intravenous Daily  . venlafaxine XR  150 mg Oral Q breakfast    ROS: Unable to provide due to mental status  Physical Examination: Blood pressure 103/77, pulse 79, temperature 98.4 F (36.9 C), temperature source Oral, resp. rate 13, height '5\' 9"'  (1.753 m), weight 65.6 kg, SpO2 97 %.  HEENT-  Normocephalic, no lesions, without obvious abnormality.  Normal external eye and conjunctiva.  Normal TM's bilaterally.  Normal auditory canals and external ears. Normal external nose, mucus membranes and septum.  Normal pharynx. Cardiovascular- S1, S2 normal, pulses palpable throughout   Lungs- chest clear, no wheezing, rales, normal symmetric air entry Abdomen- soft, non-tender; bowel sounds normal; no masses,  no organomegaly Extremities- no edema Lymph-no adenopathy palpable Musculoskeletal-no joint tenderness, deformity or swelling Skin-warm and dry, no hyperpigmentation, vitiligo, or suspicious lesions  Neurological Examination   Mental Status: Lethargic.  Responds when name called but does not open eyes.  Opens eyes with deep sternal rub and localizes to pain with both upper extremities. Does not follow commands.  Is able to say "yes" and "no" and tell me his name.    Cranial Nerves: II: Blinks to bilateral confrontation.  Pupils 47m and unreactive.   III,IV, VI: Intact oculocephalic maneuver responses V,VII: Intact corneal bilaterally VIII: hearing normal bilaterally IX,X: gag reflex unable to be tested XI: bilateral shoulder shrug unable to be tested XII: unable to be tested Motor: Moves extremities in response to sternal rub, upper extremities more than lower extremities Sensory: Does not respond to painful stimulation on the extremities Deep Tendon Reflexes: Symmetric throughout Plantars: Right: mute   Left: mute Cerebellar: Unable to perform due to mental status Gait: not tested due to safety concerns  Laboratory Studies:   Basic Metabolic Panel: Recent Labs  Lab 05/11/19 1159 05/12/19 0029 05/12/19 0400 05/12/19 2208 05/13/19 0622  NA 135 142 142 138 133*  K 4.0 3.9 3.4* 3.6 3.8  CL 92* 104 103 101 98  CO2 '30 31 31 27 24  ' GLUCOSE 883* 156* 179* 174* 148*  BUN '19 16 15 8 8  ' CREATININE 1.14 0.63 0.58* 0.85 0.65  CALCIUM 10.3 8.6* 8.6* 8.7* 8.9  MG  --  1.9 1.9 1.8 1.7  PHOS  --  2.9 2.1* 2.1* 4.1    Liver Function Tests: Recent Labs  Lab 05/11/19 1159  AST 21  ALT 30  ALKPHOS 152*  BILITOT 0.7  PROT 6.8  ALBUMIN 3.7   No results for input(s): LIPASE, AMYLASE in the last 168 hours. No results for input(s): AMMONIA in the last 168 hours.  CBC: Recent Labs  Lab 05/11/19 1159 05/12/19 0400 05/13/19 0622  WBC 8.1 8.1 9.9  NEUTROABS 6.6  --   --   HGB 13.4 12.2* 14.6  HCT 38.6* 35.0* 41.0  MCV 88.1 87.5 86.3  PLT 234 193 267    Cardiac Enzymes: No results for input(s): CKTOTAL, CKMB, CKMBINDEX, TROPONINI in the last 168 hours.  BNP: Invalid input(s): POCBNP  CBG: Recent Labs  Lab 05/12/19 1929 05/12/19 2122 05/12/19 2329 05/13/19 0334 05/13/19 0845  GLUCAP 113* 166* 154* 194* 260    Microbiology: Results for orders placed or performed during the hospital encounter of 05/11/19  SARS CORONAVIRUS  2 (TAT 6-24 HRS) Nasopharyngeal Nasopharyngeal Swab     Status: None   Collection Time: 05/11/19 12:56 PM   Specimen: Nasopharyngeal Swab  Result Value Ref Range Status   SARS Coronavirus 2 NEGATIVE NEGATIVE Final    Comment: (NOTE) SARS-CoV-2 target nucleic acids are NOT DETECTED. The SARS-CoV-2 RNA is generally detectable in upper and lower respiratory specimens during the acute phase of infection. Negative results do not preclude SARS-CoV-2 infection, do not rule out co-infections with other pathogens, and should not be used as the sole basis for treatment or other patient management decisions. Negative results must be combined with clinical observations, patient history, and epidemiological information. The expected result is Negative. Fact Sheet for Patients: SugarRoll.be Fact Sheet for Healthcare Providers: https://www.woods-mathews.com/ This test is not yet approved or cleared by the [place] FDA and  has been authorized for detection and/or diagnosis of SARS-CoV-2 by FDA under an Emergency Use Authorization (EUA). This EUA will remain  in effect (meaning this test can be used) for the duration of the COVID-19 declaration under Section 56 4(b)(1) of the Act, 21 U.S.C. section 360bbb-3(b)(1), unless the authorization is terminated or revoked sooner. Performed at [facility], [place] [address] [place], [place] [address]   MRSA PCR Screening     Status: None   Collection Time: 05/11/19  4:55 PM   Specimen: Nasopharyngeal  Result Value Ref Range Status   MRSA by PCR NEGATIVE NEGATIVE Final    Comment:        The GeneXpert MRSA Assay (FDA approved for NASAL specimens only), is one component of a comprehensive MRSA colonization surveillance program. It is not intended to diagnose MRSA infection nor to guide or monitor treatment for MRSA infections. Performed at [facility], [address] [place], [place]  [address]     Coagulation Studies: No results for input(s): LABPROT, INR in the last 72 hours.  Urinalysis:  Recent Labs  Lab 05/11/19 1159  COLORURINE STRAW*  LABSPEC 1.031*  PHURINE 5.0  GLUCOSEU >=500*  HGBUR SMALL*  BILIRUBINUR NEGATIVE  KETONESUR NEGATIVE  PROTEINUR NEGATIVE  NITRITE NEGATIVE  LEUKOCYTESUR NEGATIVE    Lipid Panel:     Component Value Date/Time   CHOL 191 01/24/2018 1100   CHOL 157 08/10/2012 0502   TRIG 283 (H) 01/24/2018 1100   TRIG 78 08/10/2012 0502   HDL 47 01/24/2018 1100   HDL 61 (H) 08/10/2012 0502   CHOLHDL 4.1 01/24/2018 1100   CHOLHDL 3.2 07/12/2016 1651   VLDL 23 07/12/2016 1651   VLDL 16 08/10/2012 0502   LDLCALC 87 01/24/2018 1100   LDLCALC 80 08/10/2012 0502    HgbA1C:  Lab Results  Component Value Date   HGBA1C 13.2 (H) 05/11/2019    Urine Drug Screen:      Component Value Date/Time   LABOPIA POSITIVE (A) 03/13/2015 2215   COCAINSCRNUR NONE DETECTED 03/13/2015 2215   COCAINSCRNUR NEGATIVE 11/20/2011 2054   LABBENZ POSITIVE (A) 03/13/2015 2215   AMPHETMU NONE DETECTED 03/13/2015 2215   THCU NONE DETECTED 03/13/2015 2215   LABBARB NONE DETECTED 03/13/2015 2215    Alcohol Level: No results for input(s): ETH in the last 168 hours.  Other results: EKG: sinus tachycardia at 108 bpm.  Imaging: Dg Chest 2 View  Result Date: 05/11/2019 CLINICAL DATA:  Shortness of breath EXAM: CHEST - 2 VIEW COMPARISON:  02/28/2019 FINDINGS: The heart size and mediastinal contours are within normal limits. Chronic coarsened interstitial markings with basilar predominant reticular opacities,  left greater than right. No pleural effusion. No pneumothorax. Partially visualized shunt catheter descends the anterior left chest wall. IMPRESSION: Chronic findings of interstitial lung disease, grossly stable compared to prior chest CT. A superimposed infectious process would be difficult to exclude. Electronically Signed   By: Davina Poke M.D.   On:  05/11/2019 13:28   Ct Head Wo Contrast  Result Date: 05/11/2019 CLINICAL DATA:  Wife states has had some vagueness and confusion x 1 week. States is weaker than usual. Patient on 3l 02 due to history of pulmonary fibrosis. Patient can states first and last name however is quite lethargic sitting in wheelchair. TKV EXAM: CT HEAD WITHOUT CONTRAST CT CERVICAL SPINE WITHOUT CONTRAST TECHNIQUE: Multidetector CT imaging of the head and cervical spine was performed following the standard protocol without intravenous contrast. Multiplanar CT image reconstructions of the cervical spine were also generated. COMPARISON:  03/13/2015, 09/21/2009 FINDINGS: CT HEAD FINDINGS Brain: Left frontal ventriculostomy catheter extends with its tip just posterior to the right caudate nucleus as before. No hydrocephalus. Patchy areas of white matter attenuation most marked in the frontal lobes as before. Negative for acute intracranial hemorrhage, midline shift, focal parenchymal edema, mass, or mass effect. Acute infarct may be inapparent on noncontrast CT. Vascular: No hyperdense vessel or unexpected calcification. Skull: Left ventriculostomy defect as before. Negative for fracture or focal lesion. Sinuses/Orbits: No acute finding. Other: None CT CERVICAL SPINE FINDINGS Alignment: Several mm anterolisthesis C4-5 probably related to asymmetric facet DJD left worse than right. 1-2 mm anterolisthesis C5-6 with solid-appearing fusion across degenerated left facet. Skull base and vertebrae: Negative for fracture. No focal bone lesion. Asymmetric facet DJD left greater than right C2-C6. Soft tissues and spinal canal: No evident hematoma. No prevertebral soft tissue swelling. Bilateral carotid arterial calcifications. No acute findings. Disc levels: Mild narrowing of C5-6, moderate narrowing C6-7 and C7-T1 with anterior and posterior endplate spurring. Upper chest: Subpleural blebs in both lung apices. No acute findings. Other: None  IMPRESSION: 1. Negative for bleed or other acute intracranial process. 2. Negative for cervical fracture or other acute bone abnormality. 3. Stable ventriculostomy catheter without hydrocephalus. 4. Multilevel cervical spondylitic changes as above. Electronically Signed   By: Lucrezia Europe M.D.   On: 05/11/2019 13:45   Ct Cervical Spine Wo Contrast  Result Date: 05/11/2019 CLINICAL DATA:  Wife states has had some vagueness and confusion x 1 week. States is weaker than usual. Patient on 3l 02 due to history of pulmonary fibrosis. Patient can states first and last name however is quite lethargic sitting in wheelchair. TKV EXAM: CT HEAD WITHOUT CONTRAST CT CERVICAL SPINE WITHOUT CONTRAST TECHNIQUE: Multidetector CT imaging of the head and cervical spine was performed following the standard protocol without intravenous contrast. Multiplanar CT image reconstructions of the cervical spine were also generated. COMPARISON:  03/13/2015, 09/21/2009 FINDINGS: CT HEAD FINDINGS Brain: Left frontal ventriculostomy catheter extends with its tip just posterior to the right caudate nucleus as before. No hydrocephalus. Patchy areas of white matter attenuation most marked in the frontal lobes as before. Negative for acute intracranial hemorrhage, midline shift, focal parenchymal edema, mass, or mass effect. Acute infarct may be inapparent on noncontrast CT. Vascular: No hyperdense vessel or unexpected calcification. Skull: Left ventriculostomy defect as before. Negative for fracture or focal lesion. Sinuses/Orbits: No acute finding. Other: None CT CERVICAL SPINE FINDINGS Alignment: Several mm anterolisthesis C4-5 probably related to asymmetric facet DJD left worse than right. 1-2 mm anterolisthesis C5-6 with solid-appearing fusion across degenerated left  facet. Skull base and vertebrae: Negative for fracture. No focal bone lesion. Asymmetric facet DJD left greater than right C2-C6. Soft tissues and spinal canal: No evident hematoma.  No prevertebral soft tissue swelling. Bilateral carotid arterial calcifications. No acute findings. Disc levels: Mild narrowing of C5-6, moderate narrowing C6-7 and C7-T1 with anterior and posterior endplate spurring. Upper chest: Subpleural blebs in both lung apices. No acute findings. Other: None IMPRESSION: 1. Negative for bleed or other acute intracranial process. 2. Negative for cervical fracture or other acute bone abnormality. 3. Stable ventriculostomy catheter without hydrocephalus. 4. Multilevel cervical spondylitic changes as above. Electronically Signed   By: Lucrezia Europe M.D.   On: 05/11/2019 13:45     Assessment/Plan: 63 year old male presenting with multiple medical problems including hemorrhagic infarct s/p shunt placement presenting with confusion for the past week and hyperosmolar non-ketotic hyperglycemia.  Patient has been declining for the past 3 months.  Etiology unclear.  Head CT reviewed and shows no evidence of shunt malfunction or acute changes.   Patient with a history of PAF on no anticoagulation.  Will rule out shower of emboli or further ischemic disease that may not have been appreciated on head CT.   Patient with tremor of left face and RUE on my entering the room but when patient alerted all tremor resolved.  Unlikely seizure but patient at increased risk with h/o hemorrhagic infarct.  Will do further testing. Also patient with history of ETOH abuse in chart.  Unclear if patient has continued to use alcohol therefore can not rule out withdrawal.   Metabolic encephalopathy remains on the differential.  With history of past cerebral insults patient's cognitive improvement will lag behind improvement in lab work.    Recommendations: 1. Agree with continuing to address medical issues.  2. EEG 3. MRI of the brain without contrast  Alexis Goodell, MD Neurology [phone] 05/13/2019, 10:02 AM

## 2019-05-13 NOTE — Progress Notes (Signed)
[facility] [place] NAME: Kirk Robinson    MR#:  [id number]  DATE OF BIRTH:  08-Mar-1956  SUBJECTIVE:   Patient's mental status still is quite poor and he is not at baseline.  Patient's wife is at bedside.  Blood sugar significantly improved since admission.  Seen by neurology and plan for possible EEG and MRI today.  REVIEW OF SYSTEMS:    Review of Systems  Unable to perform ROS: Mental acuity    Nutrition: NPO Tolerating Diet: No Tolerating PT: Await Eval.   DRUG ALLERGIES:   Allergies  Allergen Reactions  . Advair Diskus [Fluticasone-Salmeterol] Shortness Of Breath  . Erythromycin Nausea And Vomiting  . Iodinated Diagnostic Agents Shortness Of Breath  . Ativan [Lorazepam] Other (See Comments)    Caused pt's heart to stop  . Darvocet [Propoxyphene N-Acetaminophen] Nausea And Vomiting  . Doxycycline Hyclate Nausea And Vomiting, Swelling and Other (See Comments)    Tongue swelling, severe depression  . Propoxyphene Nausea And Vomiting  . Septra [Sulfamethoxazole-Trimethoprim] Rash    VITALS:  Blood pressure 107/82, pulse 99, temperature 100.1 F (37.8 C), temperature source Oral, resp. rate 13, height 5\' 9"  (1.753 m), weight 65.6 kg, SpO2 96 %.  PHYSICAL EXAMINATION:   Physical Exam  GENERAL:  63 y.o.-year-old patient lying in bed lethargic/encephalopathic but follows some commands.  EYES: Pupils equal, round, reactive to light and accommodation. No scleral icterus. Extraocular muscles intact.  HEENT: Head atraumatic, normocephalic. Dry Oral Mucosa NECK:  Supple, no jugular venous distention. No thyroid enlargement, no tenderness.  LUNGS: Poor Resp. effort, no wheezing, rales, rhonchi.  No use of accessory muscles of respiration.  CARDIOVASCULAR: S1, S2 normal. No murmurs, rubs, or gallops.  ABDOMEN: Soft, nontender, nondistended. Bowel sounds present. No organomegaly or mass.  EXTREMITIES: No cyanosis, clubbing or  edema b/l.    NEUROLOGIC: Cranial nerves II through XII are intact. No focal Motor or sensory deficits b/l.  Globally weak & Encephalopathic.  PSYCHIATRIC: The patient is alert and oriented x 1.  SKIN: No obvious rash, lesion, or ulcer.    LABORATORY PANEL:   CBC Recent Labs  Lab 05/13/19 0622  WBC 9.9  HGB 14.6  HCT 41.0  PLT 267   ------------------------------------------------------------------------------------------------------------------  Chemistries  Recent Labs  Lab 05/11/19 1159  05/13/19 0622  NA 135   < > 133*  K 4.0   < > 3.8  CL 92*   < > 98  CO2 30   < > 24  GLUCOSE 883*   < > 148*  BUN 19   < > 8  CREATININE 1.14   < > 0.65  CALCIUM 10.3   < > 8.9  MG  --    < > 1.7  AST 21  --   --   ALT 30  --   --   ALKPHOS 152*  --   --   BILITOT 0.7  --   --    < > = values in this interval not displayed.   ------------------------------------------------------------------------------------------------------------------  Cardiac Enzymes No results for input(s): TROPONINI in the last 168 hours. ------------------------------------------------------------------------------------------------------------------  RADIOLOGY:  No results found.   ASSESSMENT AND PLAN:   63 year old male with past medical history of previous CVA, hypothyroidism, GERD, diabetes, COPD, anxiety/depression, history of alcohol abuse who presented to the hospital due to altered mental status and noted to be in nonketotic hyperosmolar state.  1.  Altered mental status/encephalopathy- etiology unclear presently.  Patient does have a history of previous CVA but CT head admission was negative. - No evidence of acute infectious source. -Seen by neurology and they are concerned about possible CVA/seizures. - Await MRI of the brain today and also EEG. - Continue to follow mental status.  2.  Nonketotic hyperglycemia- much improved and off Insulin gtt.  -Continue Lantus, sliding scale insulin  for now.  Blood sugars much improved.  3.  Hyperlipidemia-continue atorvastatin.  4.  Essential hypertension-continue Cardizem  5.  Chronic pain-continue Nucynta, MS Contin.  6.  Hypothyroidism-continue Armour Thyroid.  7.  Depression-continue Effexor.  8.  COPD-no acute exacerbation.  Continue maintenance prednisone, albuterol nebulizers as needed.  All the records are reviewed and case discussed with Care Management/Social Worker. Management plans discussed with the patient, family and they are in agreement.  CODE STATUS: DNR  DVT Prophylaxis: Lovenox  TOTAL TIME TAKING CARE OF THIS PATIENT: 30 minutes.   POSSIBLE D/C IN 3-4 DAYS, DEPENDING ON CLINICAL CONDITION.   Henreitta Leber M.D on 05/13/2019 at 2:28 PM  Between 7am to 6pm - Pager - [phone]  After 6pm go to [email or web address] - Proofreader  [facility]  Office  [phone]  CC: Primary care physician; [name], MD

## 2019-05-13 NOTE — Procedures (Signed)
ELECTROENCEPHALOGRAM REPORT   Patient: Kirk Robinson       Room #: IC17A-AA EEG No. ID: [id number] Age: 63 y.o.        Sex: male Referring Physician: Verdell Carmine Report Date:  05/13/2019        Interpreting Physician: Alexis Goodell  History: Kirk Robinson is an 63 y.o. male with altered mental status  Medications:  Cardizem, Effexor, Insulin, MS Contin. Protonix, Deltasone, Synthroid, Lipitor  Conditions of Recording:  This is a 21 channel routine scalp EEG performed with bipolar and monopolar montages arranged in accordance to the international 10/20 system of electrode placement. One channel was dedicated to EKG recording.  The patient is in the altered state.  Description:  The waking background activity is slow and poorly organized.  It consists of a low voltaga mixture of frequencies with delta and theta rhythms predominating.  On occasion some faster rhythms are intermixed but poorly sustained. There are noted intermittent periodic discharges of triphasic morphology that are more frequent over the left hemisphere.  Hyperventilation and intermittent photic stimulation were not performed.   IMPRESSION: This is an abnormal electroencephalogram secondary to a slow and poorly organized background with intermixed triphasic discharges.  This finding is seen most commonly in encephalopathic states.  Although most often associated with hepatic encephalopathies, it is not isolated to this clinical scenario.   Alexis Goodell, MD Neurology [phone] 05/13/2019, 4:07 PM

## 2019-05-13 NOTE — Progress Notes (Signed)
Patient unable to have MRI because of shunt. Per MRI tech neurosurgery needs to be consulted so that shunt can be reprogrammed after MRI. Dr. Sanjuana Mae notified. Lupita Leash

## 2019-05-13 NOTE — Progress Notes (Signed)
Initial Nutrition Assessment  DOCUMENTATION CODES:   Not applicable  INTERVENTION:  Once diet advances provide Ensure Max Protein po BID, each supplement provides 150 kcal and 30 grams of protein daily.  Also provide MVI daily once diet advances.  NUTRITION DIAGNOSIS:   Inadequate oral intake related to lethargy/confusion, inability to eat as evidenced by NPO status.  GOAL:   Patient will meet greater than or equal to 90% of their needs  MONITOR:   Diet advancement, PO intake, Supplement acceptance, Labs, Weight trends, I & O's  REASON FOR ASSESSMENT:   Malnutrition Screening Tool    ASSESSMENT:   63 year old male with PMHx of memory loss, OP, hypothyroidism, anxiety, EtOH abuse, PVD, COPD, hx hemorrhagic CVA s/p VP shunt, DM admitted with AMS, hyperosmolar nonketotic state.   Patient unable to provide any history at this time due to AMS. He was getting an EEG at time of RD assessment so unable to complete NFPE. He is currently NPO. Per chart patient has been weight-stable for the past 2 years. Will monitor for diet advancement. Patient is at risk for malnutrition.  Medications reviewed and include: Novolog 0-15 units Q4hrs, Lantus 10 units daily, levothyroxine, pantoprazole, prednisone 20 mg daily, thiamine 100 mg daily IV.  Labs reviewed: CBG 190-242, Sodium 133.  NUTRITION - FOCUSED PHYSICAL EXAM:  Unable to complete at this time.  Diet Order:   Diet Order            Diet NPO time specified Except for: Sips with Meds  Diet effective now             EDUCATION NEEDS:   Not appropriate for education at this time  Skin:  Skin Assessment: Reviewed RN Assessment  Last BM:  05/13/2019 - small type 2  Height:   Ht Readings from Last 1 Encounters:  05/11/19 5\' 9"  (1.753 m)   Weight:   Wt Readings from Last 1 Encounters:  05/11/19 65.6 kg   Ideal Body Weight:  72.7 kg  BMI:  Body mass index is 21.36 kg/m.  Estimated Nutritional Needs:   Kcal:   1800-2000  Protein:  90-100 grams  Fluid:  1.8-2 L/day  Willey Blade, MS, RD, LDN Office: [phone] Pager: [phone] After Hours/Weekend Pager: [phone]

## 2019-05-13 NOTE — Progress Notes (Signed)
Follow up - Critical Care Medicine Note  Patient Details:    Kirk Robinson is an 63 y.o. 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  Admitted to stepdown for insulin infusion administration.  Lines, Airways, Drains: External Urinary Catheter (Active)  Collection Container Standard drainage bag 05/12/19 1400  Securement Method Securing device (Describe) 05/12/19 1400  Output (mL) 400 mL 05/12/19 1400    Anti-infectives:  Anti-infectives (From admission, onward)   None      Microbiology: Results for orders placed or performed during the hospital encounter of 05/11/19  SARS CORONAVIRUS 2 (TAT 6-24 HRS) Nasopharyngeal Nasopharyngeal Swab     Status: None   Collection Time: 05/11/19 12:56 PM   Specimen: Nasopharyngeal Swab  Result Value Ref Range Status   SARS Coronavirus 2 NEGATIVE NEGATIVE Final    Comment: (NOTE) SARS-CoV-2 target nucleic acids are NOT DETECTED. The SARS-CoV-2 RNA is generally detectable in upper and lower respiratory specimens during the acute phase of infection. Negative results do not preclude SARS-CoV-2 infection, do not rule out co-infections with other pathogens, and should not be used as the sole basis for treatment or other patient management decisions. Negative results must be combined with clinical observations, patient history, and epidemiological information. The expected result is Negative. Fact Sheet for Patients: SugarRoll.be Fact Sheet for Healthcare Providers: https://www.woods-mathews.com/ This test is not yet approved or cleared by the [place] FDA and  has been authorized for detection and/or diagnosis of SARS-CoV-2 by FDA under an Emergency Use Authorization (EUA). This EUA will remain  in effect (meaning this test can be used) for  the duration of the COVID-19 declaration under Section 56 4(b)(1) of the Act, 21 U.S.C. section 360bbb-3(b)(1), unless the authorization is terminated or revoked sooner. Performed at [facility], [place] [address], [place], [place] [address]   MRSA PCR Screening     Status: None   Collection Time: 05/11/19  4:55 PM   Specimen: Nasopharyngeal  Result Value Ref Range Status   MRSA by PCR NEGATIVE NEGATIVE Final    Comment:        The GeneXpert MRSA Assay (FDA approved for NASAL specimens only), is one component of a comprehensive MRSA colonization surveillance program. It is not intended to diagnose MRSA infection nor to guide or monitor treatment for MRSA infections. Performed at [facility], [address] [place], [place] [address]     Best Practice/Protocols:  VTE Prophylaxis: Lovenox (prophylaxtic dose) Hyperglycemia/ICU  Events: 09/19 admitted with altered mental status and hyperosmolar nonketotic state 09/19 CT scan of the head without evidence of hydrocephalus 09/20 glycemic control improving, still with altered mental status  Studies: Dg Chest 2 View  Result Date: 05/11/2019 CLINICAL DATA:  Shortness of breath EXAM: CHEST - 2 VIEW COMPARISON:  02/28/2019 FINDINGS: The heart size and mediastinal contours are within normal limits. Chronic coarsened interstitial markings with basilar predominant reticular opacities, left greater than right. No pleural effusion. No pneumothorax. Partially visualized shunt catheter descends the anterior left chest wall. IMPRESSION: Chronic findings of interstitial lung disease, grossly stable compared to prior chest CT. A superimposed infectious process would be difficult to exclude. Electronically Signed   By: Davina Poke M.D.   On: 05/11/2019 13:28   Ct Head Wo Contrast  Result Date: 05/11/2019 CLINICAL DATA:  Wife states has had some vagueness and confusion x 1 week.  States is weaker than usual. Patient on 3l 02  due to history of pulmonary fibrosis. Patient can states first and last name however is quite lethargic sitting in wheelchair. TKV EXAM: CT HEAD WITHOUT CONTRAST CT CERVICAL SPINE WITHOUT CONTRAST TECHNIQUE: Multidetector CT imaging of the head and cervical spine was performed following the standard protocol without intravenous contrast. Multiplanar CT image reconstructions of the cervical spine were also generated. COMPARISON:  03/13/2015, 09/21/2009 FINDINGS: CT HEAD FINDINGS Brain: Left frontal ventriculostomy catheter extends with its tip just posterior to the right caudate nucleus as before. No hydrocephalus. Patchy areas of white matter attenuation most marked in the frontal lobes as before. Negative for acute intracranial hemorrhage, midline shift, focal parenchymal edema, mass, or mass effect. Acute infarct may be inapparent on noncontrast CT. Vascular: No hyperdense vessel or unexpected calcification. Skull: Left ventriculostomy defect as before. Negative for fracture or focal lesion. Sinuses/Orbits: No acute finding. Other: None CT CERVICAL SPINE FINDINGS Alignment: Several mm anterolisthesis C4-5 probably related to asymmetric facet DJD left worse than right. 1-2 mm anterolisthesis C5-6 with solid-appearing fusion across degenerated left facet. Skull base and vertebrae: Negative for fracture. No focal bone lesion. Asymmetric facet DJD left greater than right C2-C6. Soft tissues and spinal canal: No evident hematoma. No prevertebral soft tissue swelling. Bilateral carotid arterial calcifications. No acute findings. Disc levels: Mild narrowing of C5-6, moderate narrowing C6-7 and C7-T1 with anterior and posterior endplate spurring. Upper chest: Subpleural blebs in both lung apices. No acute findings. Other: None IMPRESSION: 1. Negative for bleed or other acute intracranial process. 2. Negative for cervical fracture or other acute bone abnormality. 3. Stable ventriculostomy catheter without hydrocephalus. 4.  Multilevel cervical spondylitic changes as above. Electronically Signed   By: Lucrezia Europe M.D.   On: 05/11/2019 13:45   Ct Cervical Spine Wo Contrast  Result Date: 05/11/2019 CLINICAL DATA:  Wife states has had some vagueness and confusion x 1 week. States is weaker than usual. Patient on 3l 02 due to history of pulmonary fibrosis. Patient can states first and last name however is quite lethargic sitting in wheelchair. TKV EXAM: CT HEAD WITHOUT CONTRAST CT CERVICAL SPINE WITHOUT CONTRAST TECHNIQUE: Multidetector CT imaging of the head and cervical spine was performed following the standard protocol without intravenous contrast. Multiplanar CT image reconstructions of the cervical spine were also generated. COMPARISON:  03/13/2015, 09/21/2009 FINDINGS: CT HEAD FINDINGS Brain: Left frontal ventriculostomy catheter extends with its tip just posterior to the right caudate nucleus as before. No hydrocephalus. Patchy areas of white matter attenuation most marked in the frontal lobes as before. Negative for acute intracranial hemorrhage, midline shift, focal parenchymal edema, mass, or mass effect. Acute infarct may be inapparent on noncontrast CT. Vascular: No hyperdense vessel or unexpected calcification. Skull: Left ventriculostomy defect as before. Negative for fracture or focal lesion. Sinuses/Orbits: No acute finding. Other: None CT CERVICAL SPINE FINDINGS Alignment: Several mm anterolisthesis C4-5 probably related to asymmetric facet DJD left worse than right. 1-2 mm anterolisthesis C5-6 with solid-appearing fusion across degenerated left facet. Skull base and vertebrae: Negative for fracture. No focal bone lesion. Asymmetric facet DJD left greater than right C2-C6. Soft tissues and spinal canal: No evident hematoma. No prevertebral soft tissue swelling. Bilateral carotid arterial calcifications. No acute findings. Disc levels: Mild narrowing of C5-6, moderate narrowing C6-7 and C7-T1 with anterior and posterior  endplate spurring. Upper chest: Subpleural blebs in both lung apices. No acute findings. Other: None IMPRESSION: 1. Negative for bleed or other acute intracranial  process. 2. Negative for cervical fracture or other acute bone abnormality. 3. Stable ventriculostomy catheter without hydrocephalus. 4. Multilevel cervical spondylitic changes as above. Electronically Signed   By: Lucrezia Europe M.D.   On: 05/11/2019 13:45        Subjective:    Overnight Issues: No issues overnight. Patient is more interactive today.  He has some tremor and is mildy tachycardic but is responsive.    He was seen by neurologist today, plan is for poss MRI brain and EEG.   Objective:  Vital signs for last 24 hours: Temp:  [97.9 F (36.6 C)-98.6 F (37 C)] 97.9 F (36.6 C) (09/21 0200) Pulse Rate:  [75-106] 106 (09/21 0600) Resp:  [9-26] 13 (09/21 0600) BP: (92-149)/(67-88) 103/83 (09/21 0400) SpO2:  [91 %-99 %] 96 % (09/21 0600)  Hemodynamic parameters for last 24 hours:    Intake/Output from previous day: 09/20 0701 - 09/21 0700 In: 1924.6 [I.V.:1486.1; IV Piggyback:438.6] Out: 550 [Urine:550]  Intake/Output this shift: No intake/output data recorded.  Vent settings for last 24 hours:    Physical Exam:  Physical Exam  Constitutional: well-developed and well-nourished.Lethargic.  HENT: Normocephalic and atraumatic.  Mouth/Throat: Oropharynx is clear and moist.  Eyes: Conjunctivae are normal. No scleral icterus. Pupils are unequal.  Strabismus on right, slight disconjugate gaze, chronic.  Neck: Supple. No JVD. No tracheal deviation. No thyromegaly.  Cardiovascular: Normal rate, regular rhythm, normal heart sounds and intact distal pulses.  No murmur heard. Respiratory: Effort normal. No tachypnea. No respiratory distress. Few wheezes. He has mild rhonchi.  Coarse breath sounds  GI: Soft. Bowel sounds are normal. No distension.  Musculoskeletal: Normal range of motion.     Neurological: He appears  NAD No tremor.  No overt seizure activity.  Nursing does has noted occasional rhythmic tremor left upper extremity. Skin: Skin is warm, dry and intact.    Assessment/Plan:     1.  HHS S/p IVF now 4.4L + Diabetes coordinator - follow phase 1-3 HHS protocol - blood sugars have been 150-200    2. AMS with confusion  -s/p neuro eval - poss mRI/EEG -no seizures per neuro evaluation as patient is redirectable during episodes.    3. NSIP - plan for [place] with primary pulmonologist Dr Edwena Felty   4.  Hypothyroidism:  Continue with home regimen   5.  Chronic pain syndrome  -decreasing benzos and narcotics due to AMS    LOS: 2 days   Additional comments: Updated wife at bedside.  Patient is DNR, status confirmed.    Critical care provider statement:    Critical care time (minutes):  33   Critical care time was exclusive of:  Separately billable procedures and  treating other patients   Critical care was necessary to treat or prevent imminent or  life-threatening deterioration of the following conditions:  Altered mentals status, Interstitial lung disease, acute on chronic hypoxemia, multiple comorbid conditions.    Critical care was time spent personally by me on the following  activities:  Development of treatment plan with patient or surrogate,  discussions with consultants, evaluation of patient's response to  treatment, examination of patient, obtaining history from patient or  surrogate, ordering and performing treatments and interventions, ordering  and review of laboratory studies and re-evaluation of patient's condition   I assumed direction of critical care for this patient from another  provider in my specialty: no       Ottie Glazier, M.D.  Pulmonary & Critical Care Medicine  [facility]

## 2019-05-14 ENCOUNTER — Inpatient Hospital Stay: Payer: BC Managed Care – PPO

## 2019-05-14 LAB — BASIC METABOLIC PANEL
Anion gap: 11 (ref 5–15)
BUN: 14 mg/dL (ref 8–23)
CO2: 24 mmol/L (ref 22–32)
Calcium: 8.6 mg/dL — ABNORMAL LOW (ref 8.9–10.3)
Chloride: 102 mmol/L (ref 98–111)
Creatinine, Ser: 0.91 mg/dL (ref 0.61–1.24)
GFR calc Af Amer: >60 mL/min (ref >60)
GFR calc non Af Amer: >60 mL/min (ref >60)
Glucose, Bld: 120 mg/dL — ABNORMAL HIGH (ref 70–99)
Potassium: 3.5 mmol/L (ref 3.5–5.1)
Sodium: 137 mmol/L (ref 135–145)

## 2019-05-14 LAB — GLUCOSE, CAPILLARY
Glucose-Capillary: 108 mg/dL — ABNORMAL HIGH (ref 70–99)
Glucose-Capillary: 112 mg/dL — ABNORMAL HIGH (ref 70–99)
Glucose-Capillary: 121 mg/dL — ABNORMAL HIGH (ref 70–99)
Glucose-Capillary: 154 mg/dL — ABNORMAL HIGH (ref 70–99)
Glucose-Capillary: 183 mg/dL — ABNORMAL HIGH (ref 70–99)
Glucose-Capillary: 185 mg/dL — ABNORMAL HIGH (ref 70–99)

## 2019-05-14 LAB — MAGNESIUM: Magnesium: 1.9 mg/dL (ref 1.7–2.4)

## 2019-05-14 LAB — T3, FREE: T3, Free: 1.8 pg/mL — ABNORMAL LOW (ref 2.0–4.4)

## 2019-05-14 LAB — HIV ANTIBODY (ROUTINE TESTING W REFLEX): HIV Screen 4th Generation wRfx: NONREACTIVE

## 2019-05-14 MED ORDER — MAGNESIUM SULFATE IN D5W 1-5 GM/100ML-% IV SOLN
1.0000 g | Freq: Once | INTRAVENOUS | Status: AC
  Administered 2019-05-14: 1 g via INTRAVENOUS
  Filled 2019-05-14: qty 100

## 2019-05-14 MED ORDER — POTASSIUM CHLORIDE 10 MEQ/100ML IV SOLN
10.0000 meq | INTRAVENOUS | Status: AC
  Administered 2019-05-14 (×4): 10 meq via INTRAVENOUS
  Filled 2019-05-14 (×4): qty 100

## 2019-05-14 MED ORDER — DIGOXIN 0.25 MG/ML IJ SOLN
0.2500 mg | Freq: Two times a day (BID) | INTRAMUSCULAR | Status: DC
  Administered 2019-05-14 – 2019-05-15 (×2): 0.25 mg via INTRAVENOUS
  Filled 2019-05-14 (×2): qty 2

## 2019-05-14 MED ORDER — THYROID 60 MG PO TABS
90.0000 mg | ORAL_TABLET | Freq: Every day | ORAL | Status: DC
  Administered 2019-05-14 – 2019-05-21 (×6): 90 mg via ORAL
  Filled 2019-05-14 (×9): qty 1

## 2019-05-14 MED ORDER — DIAZEPAM 5 MG PO TABS
2.5000 mg | ORAL_TABLET | Freq: Once | ORAL | Status: AC | PRN
  Administered 2019-05-14: 5 mg via ORAL
  Filled 2019-05-14: qty 1

## 2019-05-14 MED ORDER — ADENOSINE 12 MG/4ML IV SOLN
6.0000 mg | Freq: Once | INTRAVENOUS | Status: AC
  Administered 2019-05-14: 6 mg via INTRAVENOUS

## 2019-05-14 MED ORDER — VITAMIN B-1 100 MG PO TABS
100.0000 mg | ORAL_TABLET | Freq: Every day | ORAL | Status: DC
  Administered 2019-05-14 – 2019-05-21 (×6): 100 mg via ORAL
  Filled 2019-05-14 (×7): qty 1

## 2019-05-14 MED ORDER — DIGOXIN 0.25 MG/ML IJ SOLN
0.5000 mg | Freq: Once | INTRAMUSCULAR | Status: AC
  Administered 2019-05-14: 0.5 mg via INTRAVENOUS
  Filled 2019-05-14: qty 2

## 2019-05-14 MED ORDER — SODIUM CHLORIDE 0.9 % IV BOLUS
250.0000 mL | Freq: Once | INTRAVENOUS | Status: AC
  Administered 2019-05-14: 250 mL via INTRAVENOUS

## 2019-05-14 MED ORDER — ENOXAPARIN SODIUM 40 MG/0.4ML ~~LOC~~ SOLN
40.0000 mg | SUBCUTANEOUS | Status: DC
  Administered 2019-05-14 – 2019-05-20 (×7): 40 mg via SUBCUTANEOUS
  Filled 2019-05-14 (×7): qty 0.4

## 2019-05-14 MED ORDER — GADOBUTROL 1 MMOL/ML IV SOLN
7.0000 mL | Freq: Once | INTRAVENOUS | Status: AC | PRN
  Administered 2019-05-14: 7 mL via INTRAVENOUS

## 2019-05-14 MED ORDER — MORPHINE SULFATE (PF) 2 MG/ML IV SOLN
1.0000 mg | INTRAVENOUS | Status: DC | PRN
  Administered 2019-05-14: 2 mg via INTRAVENOUS
  Administered 2019-05-14: 1 mg via INTRAVENOUS
  Administered 2019-05-15: 2 mg via INTRAVENOUS
  Filled 2019-05-14 (×3): qty 1

## 2019-05-14 NOTE — Progress Notes (Signed)
Dr. Loni Muse notified. Patients heart rate in the 160's. Dr. Loni Muse to come and evaluate patient. No orders at this time.

## 2019-05-14 NOTE — Progress Notes (Signed)
Dr. Loni Muse in patients room. EKG performed, patient continued to be on monitor while adenosine given. Digoxin ordered. Patients heart rate in the 114-116 range. Patient shakes his head no to pain. Dr. Loni Muse to consult with cardiology.

## 2019-05-14 NOTE — Consult Note (Signed)
Neurosurgery-New Consultation Evaluation 05/14/2019 Stanford Splitt [id number]  Identifying Statement: Kirk Robinson is a 63 y.o. male from [place] [address] with VP shunt and need for MRI  Physician Requesting Consultation: [facility]  History of Present Illness: Kirk Robinson was admitted on 9/19 with hyperglycemic hyperosmolar state. This has been treated but he remains encephalopathic. 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 He had an EEG which did show encephalopathic changes. Given he has not returned to normal, MRI brain was recommended. Given the shunt, a skull xray was obtained and it has a Codman programmable valve set at 90-100 mm H2O.   Past Medical History:  Past Medical History:  Diagnosis Date  . Alcohol use 03/16/2007   patient risk factors  . Allergic rhinitis, cause unspecified   . Altered mental status   . Anxiety state, unspecified   . Cervicalgia 09/17/2007  . COPD (chronic obstructive pulmonary disease) ([place])   . Diabetes mellitus without complication ([place])   . Diplopia 12/21/2007  . Esophageal reflux   . Gastritis 05/13/2009  . HCAP (healthcare-associated pneumonia) 08/12/2014  . Hematuria 03/16/2007  . Hyperthyroidism    graves disease  . Iodine hypothyroidism   . Memory loss   . NSIP (nonspecific interstitial pneumonia) ([place])    on azathioprine  . Osteoporosis, unspecified    bone density per [facility] in 2012  . Other abnormal glucose 04/08/2008  . Other diseases of lung, not elsewhere classified 01/23/2009   s/p Fleming/pulmonology consult; intolerant  to Advair  . Peripheral vascular disease, unspecified ([place]) 12/24/2009  . Personal history of colonic polyps   . Pneumonia, organism unspecified(486)   . Pure hypercholesterolemia   . Shortness of breath   . Stroke ([place]) 2005  . Tobacco use disorder 03/16/2007   patient risk factors  . Unspecified hypothyroidism  01/07/2010  . 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 Marital status: Married    Spouse name: Not on file  . Number of children: 2  . Years of education: Not on file  . Highest education level: Not on file  Occupational History    Comment: works full time 40 hrs  Social Needs  . Financial resource strain: Not on file  . Food insecurity    Worry: Not on file    Inability: Not on file  . Transportation needs    Medical: Not on file    Non-medical: Not on file  Tobacco Use  . Smoking status: Current Every Day Smoker    Packs/day: 1.00    Years: 30.00    Pack years: 30.00  . Smokeless tobacco: Never Used  Substance and Sexual Activity  . Alcohol use: No  . Drug use: No  . Sexual activity: Never  Lifestyle  . Physical activity    Days per week: Not on file    Minutes per session: Not on file  . Stress: Not on file  Relationships  . Social Herbalist on phone: Not on file    Gets together: Not on file    Attends religious service: Not on file    Active member of club or organization: Not on file    Attends meetings of clubs or organizations: Not on file    Relationship status: Not on file  . Intimate partner violence    Fear of current or ex partner:  Not on file    Emotionally abused: Not on file    Physically abused: Not on file    Forced sexual activity: Not on file  Other Topics Concern  . Not on file  Social History Narrative   Marital status:  Married x 35 years.       Children:  Children ages 44, 23.   2 grandchildren (78, 3).      Lives: with wife.     Employment: disability since 2013.       Tobacco:  1/2 ppd x 40 years.      Alcohol: rarely.      Exercise: walking the dogs daily.   Caffeine use: Carbonated beverages, Pepsi: Coffee, many servings a day Pepsi 3-4  Coffee 2 cups. Smoke detectors in home. Always wears seatbelts. Exercise: Inactive. No guns in the  home.   Family History: Family History  Problem Relation Age of Onset  . Heart disease Father        cad, chf  . Cancer Father   . Heart disease Brother 73       CABG  . Cancer Brother 56       pancreatic cancer  . Diabetes Brother   . Diabetes Brother   . Heart disease Brother 109       CABG  . Diabetes Brother   . Heart disease Brother 35       CABG  . Cancer Brother 56       prostate cancer  . Heart disease Mother        cad  . Diabetes Mother   . Stroke Mother     Review of Systems:  Review of Systems - General ROS: Positive for altered mental status Remainder of 10 point ROS Unable to obtain secondary to AMS  Physical Exam: BP 96/78   Pulse (!) 115   Temp 98.9 F (37.2 C) (Oral)   Resp 16   Ht 5\' 9"  (1.753 m)   Wt 65.6 kg   SpO2 94%   BMI 21.36 kg/m  Body mass index is 21.36 kg/m. Body surface area is 1.79 meters squared. General appearance: Awake, eyes open, lying supine in bed Head: Normocephalic, palpable shunt valve in left frontal area Eyes: Normal, slightly injected  Neurologic exam:  Mental status: alertness: alert, orientation: does not answer orientation questions affect: blunted Speech: moans when stimulated Cranial nerves:  II: Visual fields unable to be tested III/IV/VI: will track examiner V/VII:no evidence of facial assymetry Motor:moves both UE spontaneously and localizes with L>R to painful stimuli Sensory: intact to painful stimuli Gait: not tested   Laboratory: Results for orders placed or performed during the hospital encounter of 05/11/19  SARS CORONAVIRUS 2 (TAT 6-24 HRS) Nasopharyngeal Nasopharyngeal Swab   Specimen: Nasopharyngeal Swab  Result Value Ref Range   SARS Coronavirus 2 NEGATIVE NEGATIVE  MRSA PCR Screening   Specimen: Nasopharyngeal  Result Value Ref Range   MRSA by PCR NEGATIVE NEGATIVE  Urinalysis, Complete w Microscopic  Result Value Ref Range   Color, Urine STRAW (A) YELLOW   APPearance CLEAR (A) CLEAR    Specific Gravity, Urine 1.031 (H) 1.005 - 1.030   pH 5.0 5.0 - 8.0   Glucose, UA >=500 (A) NEGATIVE mg/dL   Hgb urine dipstick SMALL (A) NEGATIVE   Bilirubin Urine NEGATIVE NEGATIVE   Ketones, ur NEGATIVE NEGATIVE mg/dL   Protein, ur NEGATIVE NEGATIVE mg/dL   Nitrite NEGATIVE NEGATIVE   Leukocytes,Ua NEGATIVE NEGATIVE   RBC /  HPF 0-5 0 - 5 RBC/hpf   WBC, UA 0-5 0 - 5 WBC/hpf   Bacteria, UA NONE SEEN NONE SEEN   Squamous Epithelial / LPF NONE SEEN 0 - 5  Comprehensive metabolic panel  Result Value Ref Range   Sodium 135 135 - 145 mmol/L   Potassium 4.0 3.5 - 5.1 mmol/L   Chloride 92 (L) 98 - 111 mmol/L   CO2 30 22 - 32 mmol/L   Glucose, Bld 883 (HH) 70 - 99 mg/dL   BUN 19 6 - 20 mg/dL   Creatinine, Ser 1.14 0.61 - 1.24 mg/dL   Calcium 10.3 8.9 - 10.3 mg/dL   Total Protein 6.8 6.5 - 8.1 g/dL   Albumin 3.7 3.5 - 5.0 g/dL   AST 21 15 - 41 U/L   ALT 30 0 - 44 U/L   Alkaline Phosphatase 152 (H) 38 - 126 U/L   Total Bilirubin 0.7 0.3 - 1.2 mg/dL   GFR calc non Af Amer >60 >60 mL/min   GFR calc Af Amer >60 >60 mL/min   Anion gap 13 5 - 15  CBC with Differential  Result Value Ref Range   WBC 8.1 4.0 - 10.5 K/uL   RBC 4.38 4.22 - 5.81 MIL/uL   Hemoglobin 13.4 13.0 - 17.0 g/dL   HCT 38.6 (L) 39.0 - 52.0 %   MCV 88.1 80.0 - 100.0 fL   MCH 30.6 26.0 - 34.0 pg   MCHC 34.7 30.0 - 36.0 g/dL   RDW 15.6 (H) 11.5 - 15.5 %   Platelets 234 150 - 400 K/uL   nRBC 0.0 0.0 - 0.2 %   Neutrophils Relative % 83 %   Neutro Abs 6.6 1.7 - 7.7 K/uL   Lymphocytes Relative 6 %   Lymphs Abs 0.5 (L) 0.7 - 4.0 K/uL   Monocytes Relative 7 %   Monocytes Absolute 0.6 0.1 - 1.0 K/uL   Eosinophils Relative 1 %   Eosinophils Absolute 0.1 0.0 - 0.5 K/uL   Basophils Relative 0 %   Basophils Absolute 0.0 0.0 - 0.1 K/uL   Immature Granulocytes 3 %   Abs Immature Granulocytes 0.26 (H) 0.00 - 0.07 K/uL  Glucose, capillary  Result Value Ref Range   Glucose-Capillary >600 (HH) 70 - 99 mg/dL  Blood gas,  venous  Result Value Ref Range   pH, Ven 7.47 (H) 7.250 - 7.430   pCO2, Ven 53 44.0 - 60.0 mmHg   pO2, Ven 83.0 (H) 32.0 - 45.0 mmHg   Bicarbonate 38.6 (H) 20.0 - 28.0 mmol/L   Acid-Base Excess 12.7 (H) 0.0 - 2.0 mmol/L   O2 Saturation 96.8 %   Patient temperature 37.0    Collection site VENOUS    Sample type VENOUS   Glucose, capillary  Result Value Ref Range   Glucose-Capillary 458 (H) 70 - 99 mg/dL  Glucose, capillary  Result Value Ref Range   Glucose-Capillary 294 (H) 70 - 99 mg/dL  Hemoglobin A1c  Result Value Ref Range   Hgb A1c MFr Bld 13.2 (H) 4.8 - 5.6 %   Mean Plasma Glucose 332.14 mg/dL  Glucose, capillary  Result Value Ref Range   Glucose-Capillary 278 (H) 70 - 99 mg/dL  Basic metabolic panel  Result Value Ref Range   Sodium 142 135 - 145 mmol/L   Potassium 3.4 (L) 3.5 - 5.1 mmol/L   Chloride 103 98 - 111 mmol/L   CO2 31 22 - 32 mmol/L   Glucose, Bld 179 (H) 70 -  99 mg/dL   BUN 15 8 - 23 mg/dL   Creatinine, Ser 0.58 (L) 0.61 - 1.24 mg/dL   Calcium 8.6 (L) 8.9 - 10.3 mg/dL   GFR calc non Af Amer >60 >60 mL/min   GFR calc Af Amer >60 >60 mL/min   Anion gap 8 5 - 15  CBC  Result Value Ref Range   WBC 8.1 4.0 - 10.5 K/uL   RBC 4.00 (L) 4.22 - 5.81 MIL/uL   Hemoglobin 12.2 (L) 13.0 - 17.0 g/dL   HCT 35.0 (L) 39.0 - 52.0 %   MCV 87.5 80.0 - 100.0 fL   MCH 30.5 26.0 - 34.0 pg   MCHC 34.9 30.0 - 36.0 g/dL   RDW 15.9 (H) 11.5 - 15.5 %   Platelets 193 150 - 400 K/uL   nRBC 0.0 0.0 - 0.2 %  Glucose, capillary  Result Value Ref Range   Glucose-Capillary 249 (H) 70 - 99 mg/dL  Glucose, capillary  Result Value Ref Range   Glucose-Capillary 202 (H) 70 - 99 mg/dL  Glucose, capillary  Result Value Ref Range   Glucose-Capillary 170 (H) 70 - 99 mg/dL  Glucose, capillary  Result Value Ref Range   Glucose-Capillary 182 (H) 70 - 99 mg/dL  Glucose, capillary  Result Value Ref Range   Glucose-Capillary 138 (H) 70 - 99 mg/dL  Glucose, capillary  Result Value Ref  Range   Glucose-Capillary 124 (H) 70 - 99 mg/dL  Glucose, capillary  Result Value Ref Range   Glucose-Capillary 120 (H) 70 - 99 mg/dL  Basic metabolic panel  Result Value Ref Range   Sodium 142 135 - 145 mmol/L   Potassium 3.9 3.5 - 5.1 mmol/L   Chloride 104 98 - 111 mmol/L   CO2 31 22 - 32 mmol/L   Glucose, Bld 156 (H) 70 - 99 mg/dL   BUN 16 8 - 23 mg/dL   Creatinine, Ser 0.63 0.61 - 1.24 mg/dL   Calcium 8.6 (L) 8.9 - 10.3 mg/dL   GFR calc non Af Amer >60 >60 mL/min   GFR calc Af Amer >60 >60 mL/min   Anion gap 7 5 - 15  Magnesium  Result Value Ref Range   Magnesium 1.9 1.7 - 2.4 mg/dL  Phosphorus  Result Value Ref Range   Phosphorus 2.9 2.5 - 4.6 mg/dL  Glucose, capillary  Result Value Ref Range   Glucose-Capillary 169 (H) 70 - 99 mg/dL  Glucose, capillary  Result Value Ref Range   Glucose-Capillary 176 (H) 70 - 99 mg/dL  Glucose, capillary  Result Value Ref Range   Glucose-Capillary 371 (H) 70 - 99 mg/dL  Glucose, capillary  Result Value Ref Range   Glucose-Capillary 204 (H) 70 - 99 mg/dL  Glucose, capillary  Result Value Ref Range   Glucose-Capillary 170 (H) 70 - 99 mg/dL  Magnesium  Result Value Ref Range   Magnesium 1.9 1.7 - 2.4 mg/dL  Phosphorus  Result Value Ref Range   Phosphorus 2.1 (L) 2.5 - 4.6 mg/dL  Glucose, capillary  Result Value Ref Range   Glucose-Capillary 165 (H) 70 - 99 mg/dL  Glucose, capillary  Result Value Ref Range   Glucose-Capillary 153 (H) 70 - 99 mg/dL  Glucose, capillary  Result Value Ref Range   Glucose-Capillary 156 (H) 70 - 99 mg/dL  Glucose, capillary  Result Value Ref Range   Glucose-Capillary 231 (H) 70 - 99 mg/dL  Glucose, capillary  Result Value Ref Range   Glucose-Capillary 151 (H) 70 -  99 mg/dL  TSH  Result Value Ref Range   TSH 0.509 0.350 - 4.500 uIU/mL  T4, free  Result Value Ref Range   Free T4 0.62 0.61 - 1.12 ng/dL  Glucose, capillary  Result Value Ref Range   Glucose-Capillary 145 (H) 70 - 99 mg/dL   Glucose, capillary  Result Value Ref Range   Glucose-Capillary 177 (H) 70 - 99 mg/dL  Glucose, capillary  Result Value Ref Range   Glucose-Capillary 168 (H) 70 - 99 mg/dL  Glucose, capillary  Result Value Ref Range   Glucose-Capillary 187 (H) 70 - 99 mg/dL  T3, Free  Result Value Ref Range   T3, Free 1.8 (L) 2.0 - 4.4 pg/mL  Glucose, capillary  Result Value Ref Range   Glucose-Capillary 182 (H) 70 - 99 mg/dL  Glucose, capillary  Result Value Ref Range   Glucose-Capillary 152 (H) 70 - 99 mg/dL  Glucose, capillary  Result Value Ref Range   Glucose-Capillary 135 (H) 70 - 99 mg/dL  Glucose, capillary  Result Value Ref Range   Glucose-Capillary 118 (H) 70 - 99 mg/dL  Glucose, capillary  Result Value Ref Range   Glucose-Capillary 123 (H) 70 - 99 mg/dL  Glucose, capillary  Result Value Ref Range   Glucose-Capillary 113 (H) 70 - 99 mg/dL  Magnesium  Result Value Ref Range   Magnesium 1.8 1.7 - 2.4 mg/dL  Phosphorus  Result Value Ref Range   Phosphorus 2.1 (L) 2.5 - 4.6 mg/dL  Basic metabolic panel  Result Value Ref Range   Sodium 138 135 - 145 mmol/L   Potassium 3.6 3.5 - 5.1 mmol/L   Chloride 101 98 - 111 mmol/L   CO2 27 22 - 32 mmol/L   Glucose, Bld 174 (H) 70 - 99 mg/dL   BUN 8 8 - 23 mg/dL   Creatinine, Ser 0.85 0.61 - 1.24 mg/dL   Calcium 8.7 (L) 8.9 - 10.3 mg/dL   GFR calc non Af Amer >60 >60 mL/min   GFR calc Af Amer >60 >60 mL/min   Anion gap 10 5 - 15  Glucose, capillary  Result Value Ref Range   Glucose-Capillary 166 (H) 70 - 99 mg/dL  Glucose, capillary  Result Value Ref Range   Glucose-Capillary 154 (H) 70 - 99 mg/dL  Glucose, capillary  Result Value Ref Range   Glucose-Capillary 194 (H) 70 - 99 mg/dL  Basic metabolic panel  Result Value Ref Range   Sodium 133 (L) 135 - 145 mmol/L   Potassium 3.8 3.5 - 5.1 mmol/L   Chloride 98 98 - 111 mmol/L   CO2 24 22 - 32 mmol/L   Glucose, Bld 148 (H) 70 - 99 mg/dL   BUN 8 8 - 23 mg/dL   Creatinine, Ser 0.65  0.61 - 1.24 mg/dL   Calcium 8.9 8.9 - 10.3 mg/dL   GFR calc non Af Amer >60 >60 mL/min   GFR calc Af Amer >60 >60 mL/min   Anion gap 11 5 - 15  Procalcitonin - Baseline  Result Value Ref Range   Procalcitonin <0.10 ng/mL  CBC  Result Value Ref Range   WBC 9.9 4.0 - 10.5 K/uL   RBC 4.75 4.22 - 5.81 MIL/uL   Hemoglobin 14.6 13.0 - 17.0 g/dL   HCT 41.0 39.0 - 52.0 %   MCV 86.3 80.0 - 100.0 fL   MCH 30.7 26.0 - 34.0 pg   MCHC 35.6 30.0 - 36.0 g/dL   RDW 15.7 (H) 11.5 - 15.5 %  Platelets 267 150 - 400 K/uL   nRBC 0.0 0.0 - 0.2 %  Magnesium  Result Value Ref Range   Magnesium 1.7 1.7 - 2.4 mg/dL  Phosphorus  Result Value Ref Range   Phosphorus 4.1 2.5 - 4.6 mg/dL  Glucose, capillary  Result Value Ref Range   Glucose-Capillary 201 (H) 70 - 99 mg/dL  Glucose, capillary  Result Value Ref Range   Glucose-Capillary 190 (H) 70 - 99 mg/dL  Ammonia  Result Value Ref Range   Ammonia 20 9 - 35 umol/L  Glucose, capillary  Result Value Ref Range   Glucose-Capillary 242 (H) 70 - 99 mg/dL  Basic metabolic panel  Result Value Ref Range   Sodium 137 135 - 145 mmol/L   Potassium 3.5 3.5 - 5.1 mmol/L   Chloride 102 98 - 111 mmol/L   CO2 24 22 - 32 mmol/L   Glucose, Bld 120 (H) 70 - 99 mg/dL   BUN 14 8 - 23 mg/dL   Creatinine, Ser 0.91 0.61 - 1.24 mg/dL   Calcium 8.6 (L) 8.9 - 10.3 mg/dL   GFR calc non Af Amer >60 >60 mL/min   GFR calc Af Amer >60 >60 mL/min   Anion gap 11 5 - 15  Glucose, capillary  Result Value Ref Range   Glucose-Capillary 141 (H) 70 - 99 mg/dL  Glucose, capillary  Result Value Ref Range   Glucose-Capillary 112 (H) 70 - 99 mg/dL  Glucose, capillary  Result Value Ref Range   Glucose-Capillary 121 (H) 70 - 99 mg/dL   I personally reviewed labs  Imaging:  CT Head: Left frontal ventriculostomy catheter extends with its tip just posterior to the right caudate nucleus as before. No hydrocephalus. Patchy areas of white matter attenuation most marked in the  frontal lobes as before. Negative for acute intracranial hemorrhage, midline shift, focal parenchymal edema, mass, or mass effect. Acute infarct may be inapparent on noncontrast CT.  Skull xray: Right frontal VP shunt tubing unchanged in position. No areas of disconnection or discontinuity. On personal review, setting is at 90-100 mm H2O  Impression/Plan:  Kirk Robinson is here for evaluation of encephalopathy with prior VP shunt placement. CT head reveals no signs of hydrocephalus making shunt malfunction unlikely. He has no obvious infection signs but given immunosupression and DM, LP can be performed to rule out CSF infection. He is clear to have a brain MRI and we recommend a skull xray after the MRI to verify the shunt setting. If it is changed, we can reset here.    1.  Diagnosis: Altered mental status  2.  Plan - Clear for MRI, please contact us afterwards and we can review the skull xray

## 2019-05-14 NOTE — Progress Notes (Addendum)
PCCM ATTENDING ATTESTATION:  I have evaluated patient., I personally  reviewed database in its entirety and discussed care plan in detail. In addition, this patient was discussed on multidisciplinary rounds.   Important exam findings: Remains confused but able to answer slowly with one word answers  Reg, no Murmurs NT.ND, soft +BS No edema No focal deficits    Major problems addressed by PCCM team: Altered mental status possibly due to metabolic encephalopathy    - wife states he previously had similar episodes    PLAN/REC: Plan for MRI today.  Continue current care.  Will consider LP  Continue to monitor in ICU/SDU    Critical Care Time devoted to patient care services described in this note is 32 minutes.   Overall, patient is critically ill, prognosis is guarded.  Patient with Multiorgan failure and at high risk for cardiac arrest and death.      Ottie Glazier, M.D.  Pulmonary & [place]         Name: Kirk Robinson MRN: [id number] DOB: 05-24-1956     CONSULTATION DATE: 05/14/2019  CHIEF COMPLAINT:  Type 2 diabetes mellitus with hyperosmolar nonketotic hyperglycemia  SIGNIFICANT EVENTS/STUDIES:  09/19 admitted with altered mental status and hyperosmolar nonketotic state 09/19 CT scan of the head without evidence of hydrocephalus 09/20 glycemic control improving, still with altered mental status 09/21 EEG showed diffuse encephalopathy 09/22 Pt went into SVT with possible a fib or flutter requiring 75m adenosine   HISTORY OF PRESENT ILLNESS: Kirk Robinson is a 63yo male with history of tobacco abuse, COPD, T2DM, pulmonary fibrosis with multiple episodes of CAP, CVA, hypothyroidism, and hydrocephalus requiring VP shunt. He presented to the ED 9/19 with altered mental status over the past week, weakness, and increasing SOB (wears 3L O2 as baseline at home). Blood glucose was elevated at 883 upon arrival. Admitted to  the stepdown unit for insulin infusion administration.  Today, patient had an episode of SVT and hypotension which was broken with 675madenosine rapid push.   PAST MEDICAL HISTORY :   has a past medical history of Alcohol use (03/16/2007), Allergic rhinitis, cause unspecified, Altered mental status, Anxiety state, unspecified, Cervicalgia (09/17/2007), COPD (chronic obstructive pulmonary disease) ([facility] Diabetes mellitus without complication ([facility] Diplopia (12/21/2007), Esophageal reflux, Gastritis (05/13/2009), HCAP (healthcare-associated pneumonia) (08/12/2014), Hematuria (03/16/2007), Hyperthyroidism, Iodine hypothyroidism, Memory loss, NSIP (nonspecific interstitial pneumonia) ([facility] Osteoporosis, unspecified, Other abnormal glucose (04/08/2008), Other diseases of lung, not elsewhere classified (01/23/2009), Peripheral vascular disease, unspecified ([facility](12/24/2009), Personal history of colonic polyps, Pneumonia, organism unspecified(486), Pure hypercholesterolemia, Shortness of breath, Stroke ([facility](2005), Tobacco use disorder (03/16/2007), Unspecified hypothyroidism (01/07/2010), and 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  has a past surgical history that includes Eye surgery (2010); Brain surgery (2005); Admission 11/2011; Neuropsychiatric evaluation (04/2012); and Laparoscopic revision ventricular-peritoneal (v-p) shunt (Right, 05/20/2014). Prior to Admission medications   Medication Sig Start Date End Date Taking? Authorizing Provider  albuterol (PROVENTIL HFA;VENTOLIN HFA) 108 (90 BASE) MCG/ACT inhaler Inhale 2 puffs into the lungs every 6 (six) hours as needed for wheezing or shortness of breath.   Yes [provider]  atorvastatin (LIPITOR) 20 MG tablet Take 1 tablet (20 mg total) by mouth daily at 6 PM. 10/24/17  Yes SmWardell HonourMD  blood glucose meter kit and supplies KIT Check sugar once daily for Dx: E11.9 08/06/15  Yes SmWardell HonourMD  diazepam  (VALIUM) 5 MG tablet Take 5 mg by mouth  2 (two) times daily.  04/25/14  Yes [provider]  diltiazem (CARDIZEM CD) 120 MG 24 hr capsule TAKE 1 CAPSULE (120 MG TOTAL) BY MOUTH DAILY. Patient taking differently: Take 120 mg by mouth daily.  10/24/17  Yes [name], MD  glucose blood test strip Check sugar once daily. Dx: E11.9 10/18/17  Yes [name], MD  Lancets (FREESTYLE) lancets Check sugars once daily.  Dx: E11.9 10/18/17  Yes [name], MD  metFORMIN (GLUCOPHAGE) 1000 MG tablet TAKE 1 TABLET BY MOUTH 2 TIMES DAILY WITH MEAL. Patient taking differently: Take 1,000 mg by mouth 2 (two) times daily with a meal.  03/16/18  Yes [name], MD  morphine (MS CONTIN) 60 MG 12 hr tablet Take 60 mg by mouth every 8 (eight) hours. 03/27/15  Yes [provider]  predniSONE (DELTASONE) 10 MG tablet Take 20 mg by mouth daily with breakfast.    Yes [provider]  predniSONE (DELTASONE) 10 MG tablet Take 10 mg by mouth every evening.  11/13/17  Yes [provider]  Tapentadol HCl (NUCYNTA) 75 MG TABS Take 75 mg by mouth every 4 (four) hours as needed for moderate pain or severe pain.    Yes [provider]  thyroid (ARMOUR) 90 MG tablet Take 90 mg by mouth daily at 2 PM daily at 2 PM.    Yes [provider]  venlafaxine XR (EFFEXOR-XR) 150 MG 24 hr capsule Take 1 capsule (150 mg total) by mouth daily with breakfast. 01/24/18  Yes [name], MD  escitalopram (LEXAPRO) 10 MG tablet Take 1 tablet (10 mg total) by mouth daily. Office visit needed for refills Patient not taking: Reported on 05/11/2019 10/18/17   [name], MD  omeprazole (PRILOSEC) 40 MG capsule TAKE 1 CAPSULE (40 MG TOTAL) BY MOUTH DAILY. Patient not taking: Reported on 05/11/2019 10/18/17   [name], MD   Allergies  Allergen Reactions  . Advair Diskus [Fluticasone-Salmeterol] Shortness Of Breath  . Erythromycin Nausea And Vomiting  . Iodinated Diagnostic Agents  Shortness Of Breath  . Ativan [Lorazepam] Other (See Comments)    Caused pt's heart to stop  . Darvocet [Propoxyphene N-Acetaminophen] Nausea And Vomiting  . Doxycycline Hyclate Nausea And Vomiting, Swelling and Other (See Comments)    Tongue swelling, severe depression  . Propoxyphene Nausea And Vomiting  . Septra [Sulfamethoxazole-Trimethoprim] Rash    REVIEW OF SYSTEMS:   Unable to obtain due to critical illness   VITAL SIGNS: Temp:  [98 F (36.7 C)-100.1 F (37.8 C)] 98.5 F (36.9 C) (09/22 0800) Pulse Rate:  [87-168] 115 (09/22 1030) Resp:  [11-30] 13 (09/22 1030) BP: (78-114)/(62-97) 85/69 (09/22 1030) SpO2:  [89 %-100 %] 94 % (09/22 1030)   I/O last 3 completed shifts: In: 840.5 [I.V.:151.9; IV Piggyback:688.6] Out: -  No intake/output data recorded.   SpO2: 94 % O2 Flow Rate (L/min): 2 L/min   Physical Examination:  GENERAL:critically ill appearing, no resp distress HEAD: Normocephalic, atraumatic.  EYES: Pupils equal, round, reactive to light.  No scleral icterus.  MOUTH: Moist mucosal membrane. NECK: Supple. No JVD.  PULMONARY: lungs clear to auscultation CARDIOVASCULAR: S1 and S2. Regular rate and rhythm. No murmurs, rubs, or gallops.  GASTROINTESTINAL: Soft, nontender, -distended. No masses. Positive bowel sounds. No hepatosplenomegaly.  MUSCULOSKELETAL: No swelling, clubbing, or edema.  NEUROLOGIC: opens eyes to voice and makes brief eye contact, responds rarely to questions with head nod SKIN:intact,warm,dry   I personally  reviewed lab work that was obtained in last 24 hrs. CXR Independently reviewed  MEDICATIONS: I have reviewed all medications and confirmed regimen as documented   CULTURE RESULTS   Recent Results (from the past 240 hour(s))  SARS CORONAVIRUS 2 (TAT 6-24 HRS) Nasopharyngeal Nasopharyngeal Swab     Status: None   Collection Time: 05/11/19 12:56 PM   Specimen: Nasopharyngeal Swab  Result Value Ref Range Status   SARS  Coronavirus 2 NEGATIVE NEGATIVE Final    Comment: (NOTE) SARS-CoV-2 target nucleic acids are NOT DETECTED. The SARS-CoV-2 RNA is generally detectable in upper and lower respiratory specimens during the acute phase of infection. Negative results do not preclude SARS-CoV-2 infection, do not rule out co-infections with other pathogens, and should not be used as the sole basis for treatment or other patient management decisions. Negative results must be combined with clinical observations, patient history, and epidemiological information. The expected result is Negative. Fact Sheet for Patients: SugarRoll.be Fact Sheet for Healthcare Providers: https://www.woods-mathews.com/ This test is not yet approved or cleared by the [place] FDA and  has been authorized for detection and/or diagnosis of SARS-CoV-2 by FDA under an Emergency Use Authorization (EUA). This EUA will remain  in effect (meaning this test can be used) for the duration of the COVID-19 declaration under Section 56 4(b)(1) of the Act, 21 U.S.C. section 360bbb-3(b)(1), unless the authorization is terminated or revoked sooner. Performed at [facility], [place] [address] [place], [place] [address]   MRSA PCR Screening     Status: None   Collection Time: 05/11/19  4:55 PM   Specimen: Nasopharyngeal  Result Value Ref Range Status   MRSA by PCR NEGATIVE NEGATIVE Final    Comment:        The GeneXpert MRSA Assay (FDA approved for NASAL specimens only), is one component of a comprehensive MRSA colonization surveillance program. It is not intended to diagnose MRSA infection nor to guide or monitor treatment for MRSA infections. Performed at [facility], [address] [place], [place] [address]           IMAGING    Dg Skull 1-3 Views  Result Date: 05/13/2019 CLINICAL DATA:  VP shunt EXAM: SKULL - 1-3 VIEW COMPARISON:  08/12/2014 FINDINGS: Right frontal  VP shunt tubing unchanged in position. No areas of disconnection or discontinuity. Normal calvarium.  Paranasal sinuses clear IMPRESSION: Right frontal VP shunt unchanged from prior studies. Electronically Signed   By: Franchot Gallo M.D.   On: 05/13/2019 19:12    ASSESSMENT AND PLAN SYNOPSIS 63 yo male with history of CVA, hypothyroidism, GERD, T2 DM, COPD, alcohol abuse, and hydrocephaly who presented to ED due to AMS with hyperosmolar nonketotic hyperglycemia. He has continued altered mental status and encephalopathy on EEG.  HYPEROSMOLAR NONKETOTIC HYPERGLYCEMIA - resolved - continue sliding scale insulin as indicated  ALTERED MENTAL STATUS/ENCEPHALOPATHY - MRI pending - continue to monitor mental status - continue to monitor electrolytes for any changes  CARDIAC ICU monitoring  GI GI PROPHYLAXIS as indicated  ENDO - will use ICU hypoglycemic\Hyperglycemia protocol if needed  ELECTROLYTES -follow labs as needed -replace as needed -pharmacy consultation and following  DVT/GI PRX ordered TRANSFUSIONS AS NEEDED MONITOR FSBS ASSESS the need for LABS    Critical Care Time devoted to patient care services described in this note is 45 minutes.    Nira Conn, PA-S

## 2019-05-14 NOTE — Consult Note (Signed)
PHARMACY CONSULT NOTE - FOLLOW UP  Pharmacy Consult for Electrolyte Monitoring and Replacement   Recent Labs: Potassium (mmol/L)  Date Value  05/14/2019 3.5  05/11/2014 4.5   Magnesium (mg/dL)  Date Value  05/14/2019 1.9   Calcium (mg/dL)  Date Value  05/14/2019 8.6 (L)   Calcium, Total (mg/dL)  Date Value  05/11/2014 8.8   Albumin (g/dL)  Date Value  05/11/2019 3.7  01/24/2018 4.1  05/05/2014 2.7 (L)   Phosphorus (mg/dL)  Date Value  05/13/2019 4.1   Sodium (mmol/L)  Date Value  05/14/2019 137  01/24/2018 142  05/11/2014 134 (L)     Assessment: 63 y/o M admitted 9/19 with HHS and AMS. Patient history significant for hydrocephalus requiring VP shunt, COPD, T2DM, pulmonary fibrosis, hemorrhagic stroke, hypothyroidism.   9/21 EEG revealing for diffuse encephalopathy. 9/22 MRI showed no acute abnormality. Patient with SVT this morning with possible atrial arrhythmia requiring 6 mg adenosine. He is currently on rate control with IV digoxin and PO diltiazem.   Goal of Therapy:  K ~4 , Mg ~2 , Phos 2.5 - 4.5  Plan:  --IV Magnesium 1 g x1 --IV Potassium 10 mEq x4 --BMP and Mg tomorrow AM  [place] Resident 05/14/2019 3:00 PM

## 2019-05-14 NOTE — Progress Notes (Signed)
[facility] [place]    MR#:  [id number]  DATE OF BIRTH:  11/17/1955  SUBJECTIVE:   Patient's mental status still not at baseline.  He is following some simple commands.  EEG was negative yesterday for seizures.  MRI of the brain currently still pending.  Patient is off insulin drip.  Remains n.p.o.  REVIEW OF SYSTEMS:    Review of Systems  Unable to perform ROS: Mental acuity    Nutrition: NPO Tolerating Diet: No Tolerating PT: Await Eval.   DRUG ALLERGIES:   Allergies  Allergen Reactions   Advair Diskus [Fluticasone-Salmeterol] Shortness Of Breath   Erythromycin Nausea And Vomiting   Iodinated Diagnostic Agents Shortness Of Breath   Ativan [Lorazepam] Other (See Comments)    Caused pt's heart to stop   Darvocet [Propoxyphene N-Acetaminophen] Nausea And Vomiting   Doxycycline Hyclate Nausea And Vomiting, Swelling and Other (See Comments)    Tongue swelling, severe depression   Propoxyphene Nausea And Vomiting   Septra [Sulfamethoxazole-Trimethoprim] Rash    VITALS:  Blood pressure (!) 85/69, pulse (!) 115, temperature 98.5 F (36.9 C), resp. rate 13, height 5\' 9"  (1.753 m), weight 65.6 kg, SpO2 94 %.  PHYSICAL EXAMINATION:   Physical Exam  GENERAL:  63 y.o.-year-old patient lying in bed lethargic/encephalopathic but follows some commands.  EYES: Pupils equal, round, reactive to light and accommodation. No scleral icterus. Extraocular muscles intact.  HEENT: Head atraumatic, normocephalic. Dry Oral Mucosa NECK:  Supple, no jugular venous distention. No thyroid enlargement, no tenderness.  LUNGS: Poor Resp. effort, no wheezing, rales, rhonchi.  No use of accessory muscles of respiration.  CARDIOVASCULAR: S1, S2 normal. No murmurs, rubs, or gallops.  ABDOMEN: Soft, nontender, nondistended. Bowel sounds present. No organomegaly or mass.  EXTREMITIES: No cyanosis, clubbing or edema b/l.      NEUROLOGIC: Cranial nerves II through XII are intact. No focal Motor or sensory deficits b/l.  Globally weak & Encephalopathic.  PSYCHIATRIC: The patient is alert and oriented x 1.  SKIN: No obvious rash, lesion, or ulcer.    LABORATORY PANEL:   CBC Recent Labs  Lab 05/13/19 0622  WBC 9.9  HGB 14.6  HCT 41.0  PLT 267   ------------------------------------------------------------------------------------------------------------------  Chemistries  Recent Labs  Lab 05/11/19 1159  05/14/19 0438 05/14/19 0444  NA 135   < >  --  137  K 4.0   < >  --  3.5  CL 92*   < >  --  102  CO2 30   < >  --  24  GLUCOSE 883*   < >  --  120*  BUN 19   < >  --  14  CREATININE 1.14   < >  --  0.91  CALCIUM 10.3   < >  --  8.6*  MG  --    < > 1.9  --   AST 21  --   --   --   ALT 30  --   --   --   ALKPHOS 152*  --   --   --   BILITOT 0.7  --   --   --    < > = values in this interval not displayed.   ------------------------------------------------------------------------------------------------------------------  Cardiac Enzymes No results for input(s): TROPONINI in the last 168 hours. ------------------------------------------------------------------------------------------------------------------  RADIOLOGY:  Dg Skull 1-3 Views  Result Date: 05/13/2019 CLINICAL DATA:  VP shunt EXAM: SKULL -  1-3 VIEW COMPARISON:  08/12/2014 FINDINGS: Right frontal VP shunt tubing unchanged in position. No areas of disconnection or discontinuity. Normal calvarium.  Paranasal sinuses clear IMPRESSION: Right frontal VP shunt unchanged from prior studies. Electronically Signed   By: Franchot Gallo M.D.   On: 05/13/2019 19:12     ASSESSMENT AND PLAN:   63 year old male with past medical history of previous Hemorrhagic CVA, hypothyroidism, GERD, diabetes, COPD, anxiety/depression, history of alcohol abuse who presented to the hospital due to altered mental status and noted to be in nonketotic  hyperosmolar state.  1.  Altered mental status/encephalopathy- etiology unclear presently.  Patient does have a history of previous CVA but CT head admission was negative. -No acute infectious source, seen by neurology and they suspected seizures but EEG was negative. -MRI of the brain still pending.  Avoid sedative meds, continue to follow mental status.  As per neurology and neurosurgery they think this is likely metabolic related to his previous hemorrhagic CVA and it is taking him longer to recover.  2.  Nonketotic hyperglycemia- much improved and off Insulin gtt.  -Continue Lantus, sliding scale insulin for now.  Blood sugars much improved.  3.  History of hemorrhagic CVA- patient has a previous VP shunt and CT scan admission showed no evidence of hydrocephalus. -Neurology and neurosurgery following.  No acute surgical indication.  4.  Hyperlipidemia-continue atorvastatin.  5.  Essential hypertension-continue Cardizem  6.  Chronic pain-continue Nucynta, MS Contin.  7.  Hypothyroidism-continue Armour Thyroid.  8.  Depression-continue Effexor.  9.  COPD-no acute exacerbation.  Continue maintenance prednisone, albuterol nebulizers as needed.  All the records are reviewed and case discussed with Care Management/Social Worker. Management plans discussed with the patient, family and they are in agreement.  CODE STATUS: DNR  DVT Prophylaxis: Lovenox  TOTAL TIME TAKING CARE OF THIS PATIENT: 30 minutes.   POSSIBLE D/C IN 3-4 DAYS, DEPENDING ON CLINICAL CONDITION.   Henreitta Leber M.D on 05/14/2019 at 2:07 PM  Between 7am to 6pm - Pager - [phone]  After 6pm go to [email or web address] - Proofreader  [facility]  Office  [phone]  CC: Primary care physician; [name], MD

## 2019-05-14 NOTE — Progress Notes (Signed)
Subjective: Patient more alert today but remains altered.    Objective: Current vital signs: BP 90/63   Pulse (!) 124   Temp 98.5 F (36.9 C)   Resp 12   Ht 5\' 9"  (1.753 m)   Wt 65.6 kg   SpO2 93%   BMI 21.36 kg/m  Vital signs in last 24 hours: Temp:  [98 F (36.7 C)-100.1 F (37.8 C)] 98.5 F (36.9 C) (09/22 0800) Pulse Rate:  [79-128] 124 (09/22 0800) Resp:  [11-22] 12 (09/22 0800) BP: (89-114)/(63-97) 90/63 (09/22 0800) SpO2:  [91 %-100 %] 93 % (09/22 0800)  Intake/Output from previous day: 09/21 0701 - 09/22 0700 In: 250 [IV Piggyback:250] Out: -  Intake/Output this shift: No intake/output data recorded. Nutritional status:  Diet Order            Diet NPO time specified Except for: Sips with Meds  Diet effective now              Neurologic Exam: Mental Status: Alert.  Attempts to follow commands at times but does not do what requested.  Otherwise does not follow commands.  Is able to say "yes" and "no" and tell me his name.   Cranial Nerves: II: Blinks to bilateral confrontation.  Pupils 67mm and unreactive.   III,IV, VI: Intact oculocephalic maneuver responses V,VII: Intact corneal bilaterally VIII: hearing normal bilaterally IX,X: gag reflex unable to be tested XI: bilateral shoulder shrug unable to be tested XII: unable to be tested Motor: Moves all extremities, UE>LE   Lab Results: Basic Metabolic Panel: Recent Labs  Lab 05/12/19 0029 05/12/19 0400 05/12/19 2208 05/13/19 0622 05/14/19 0444  NA 142 142 138 133* 137  K 3.9 3.4* 3.6 3.8 3.5  CL 104 103 101 98 102  CO2 31 31 27 24 24   GLUCOSE 156* 179* 174* 148* 120*  BUN 16 15 8 8 14   CREATININE 0.63 0.58* 0.85 0.65 0.91  CALCIUM 8.6* 8.6* 8.7* 8.9 8.6*  MG 1.9 1.9 1.8 1.7  --   PHOS 2.9 2.1* 2.1* 4.1  --     Liver Function Tests: Recent Labs  Lab 05/11/19 1159  AST 21  ALT 30  ALKPHOS 152*  BILITOT 0.7  PROT 6.8  ALBUMIN 3.7   No results for input(s): LIPASE, AMYLASE in the  last 168 hours. Recent Labs  Lab 05/13/19 1249  AMMONIA 20    CBC: Recent Labs  Lab 05/11/19 1159 05/12/19 0400 05/13/19 0622  WBC 8.1 8.1 9.9  NEUTROABS 6.6  --   --   HGB 13.4 12.2* 14.6  HCT 38.6* 35.0* 41.0  MCV 88.1 87.5 86.3  PLT 234 193 267    Cardiac Enzymes: No results for input(s): CKTOTAL, CKMB, CKMBINDEX, TROPONINI in the last 168 hours.  Lipid Panel: No results for input(s): CHOL, TRIG, HDL, CHOLHDL, VLDL, LDLCALC in the last 168 hours.  CBG: Recent Labs  Lab 05/13/19 1602 05/13/19 2014 05/14/19 0014 05/14/19 0338 05/14/19 0753  GLUCAP 242* 141* 112* 121* 108*    Microbiology: Results for orders placed or performed during the hospital encounter of 05/11/19  SARS CORONAVIRUS 2 (TAT 6-24 HRS) Nasopharyngeal Nasopharyngeal Swab     Status: None   Collection Time: 05/11/19 12:56 PM   Specimen: Nasopharyngeal Swab  Result Value Ref Range Status   SARS Coronavirus 2 NEGATIVE NEGATIVE Final    Comment: (NOTE) SARS-CoV-2 target nucleic acids are NOT DETECTED. The SARS-CoV-2 RNA is generally detectable in upper and lower respiratory specimens during the  acute phase of infection. Negative results do not preclude SARS-CoV-2 infection, do not rule out co-infections with other pathogens, and should not be used as the sole basis for treatment or other patient management decisions. Negative results must be combined with clinical observations, patient history, and epidemiological information. The expected result is Negative. Fact Sheet for Patients: SugarRoll.be Fact Sheet for Healthcare Providers: https://www.woods-mathews.com/ This test is not yet approved or cleared by the [place] FDA and  has been authorized for detection and/or diagnosis of SARS-CoV-2 by FDA under an Emergency Use Authorization (EUA). This EUA will remain  in effect (meaning this test can be used) for the duration of the COVID-19  declaration under Section 56 4(b)(1) of the Act, 21 U.S.C. section 360bbb-3(b)(1), unless the authorization is terminated or revoked sooner. Performed at [facility], [place] [address] [place], [place] [address]   MRSA PCR Screening     Status: None   Collection Time: 05/11/19  4:55 PM   Specimen: Nasopharyngeal  Result Value Ref Range Status   MRSA by PCR NEGATIVE NEGATIVE Final    Comment:        The GeneXpert MRSA Assay (FDA approved for NASAL specimens only), is one component of a comprehensive MRSA colonization surveillance program. It is not intended to diagnose MRSA infection nor to guide or monitor treatment for MRSA infections. Performed at [facility], [address] [place], [place] [address]     Coagulation Studies: No results for input(s): LABPROT, INR in the last 72 hours.  Imaging: Dg Skull 1-3 Views  Result Date: 05/13/2019 CLINICAL DATA:  VP shunt EXAM: SKULL - 1-3 VIEW COMPARISON:  08/12/2014 FINDINGS: Right frontal VP shunt tubing unchanged in position. No areas of disconnection or discontinuity. Normal calvarium.  Paranasal sinuses clear IMPRESSION: Right frontal VP shunt unchanged from prior studies. Electronically Signed   By: Franchot Gallo M.D.   On: 05/13/2019 19:12    Medications:  I have reviewed the patient's current medications. Scheduled: . atorvastatin  20 mg Oral q1800  . Chlorhexidine Gluconate Cloth  6 each Topical Daily  . diazepam  5 mg Oral BID  . diltiazem  120 mg Oral Daily  . enoxaparin (LOVENOX) injection  40 mg Subcutaneous Q24H  . insulin aspart  0-15 Units Subcutaneous Q4H  . insulin glargine  10 Units Subcutaneous QHS  . morphine  30 mg Oral Q8H  . pantoprazole  40 mg Oral Daily  . predniSONE  20 mg Oral Q breakfast  . sodium chloride flush  3 mL Intravenous Once  . thiamine  100 mg Oral Daily  . thyroid  90 mg Oral QAC breakfast  . venlafaxine XR  150 mg Oral Q breakfast    Assessment/Plan: Patient  more alert today but remains altered.  EEG consistent with encephalopathy.  Patient to have MRI today.    Recommendations: 1. Agree with continuing to address medical issues.  2. MRI of the brain without contrast pending   LOS: 3 days   Alexis Goodell, MD Neurology [phone] 05/14/2019  9:38 AM

## 2019-05-15 LAB — GLUCOSE, CAPILLARY
Glucose-Capillary: 76 mg/dL (ref 70–99)
Glucose-Capillary: 80 mg/dL (ref 70–99)
Glucose-Capillary: 85 mg/dL (ref 70–99)
Glucose-Capillary: 90 mg/dL (ref 70–99)
Glucose-Capillary: 93 mg/dL (ref 70–99)
Glucose-Capillary: 99 mg/dL (ref 70–99)

## 2019-05-15 LAB — CBC
HCT: 43.2 % (ref 39.0–52.0)
Hemoglobin: 14.9 g/dL (ref 13.0–17.0)
MCH: 30.2 pg (ref 26.0–34.0)
MCHC: 34.5 g/dL (ref 30.0–36.0)
MCV: 87.6 fL (ref 80.0–100.0)
Platelets: 265 10*3/uL (ref 150–400)
RBC: 4.93 MIL/uL (ref 4.22–5.81)
RDW: 16 % — ABNORMAL HIGH (ref 11.5–15.5)
WBC: 10.7 10*3/uL — ABNORMAL HIGH (ref 4.0–10.5)
nRBC: 0 % (ref 0.0–0.2)

## 2019-05-15 LAB — BASIC METABOLIC PANEL
Anion gap: 12 (ref 5–15)
BUN: 20 mg/dL (ref 8–23)
CO2: 22 mmol/L (ref 22–32)
Calcium: 8.4 mg/dL — ABNORMAL LOW (ref 8.9–10.3)
Chloride: 104 mmol/L (ref 98–111)
Creatinine, Ser: 0.74 mg/dL (ref 0.61–1.24)
GFR calc Af Amer: >60 mL/min (ref >60)
GFR calc non Af Amer: >60 mL/min (ref >60)
Glucose, Bld: 84 mg/dL (ref 70–99)
Potassium: 3.8 mmol/L (ref 3.5–5.1)
Sodium: 138 mmol/L (ref 135–145)

## 2019-05-15 LAB — MAGNESIUM: Magnesium: 2.3 mg/dL (ref 1.7–2.4)

## 2019-05-15 MED ORDER — POTASSIUM CHLORIDE 10 MEQ/100ML IV SOLN
10.0000 meq | INTRAVENOUS | Status: AC
  Administered 2019-05-15 (×3): 10 meq via INTRAVENOUS
  Filled 2019-05-15 (×3): qty 100

## 2019-05-15 MED ORDER — SODIUM CHLORIDE 0.9 % IV BOLUS
500.0000 mL | Freq: Once | INTRAVENOUS | Status: AC
  Administered 2019-05-15: 500 mL via INTRAVENOUS

## 2019-05-15 MED ORDER — CHLORHEXIDINE GLUCONATE CLOTH 2 % EX PADS
6.0000 | MEDICATED_PAD | Freq: Every day | CUTANEOUS | Status: DC
  Administered 2019-05-15 – 2019-05-20 (×4): 6 via TOPICAL

## 2019-05-15 MED ORDER — AMIODARONE IV BOLUS ONLY 150 MG/100ML: 150.0000 mg | Freq: Once | INTRAVENOUS | Status: DC

## 2019-05-15 MED ORDER — INSULIN GLARGINE 100 UNIT/ML ~~LOC~~ SOLN
5.0000 [IU] | Freq: Every day | SUBCUTANEOUS | Status: DC
  Administered 2019-05-16 – 2019-05-19 (×4): 5 [IU] via SUBCUTANEOUS
  Filled 2019-05-15 (×6): qty 0.05

## 2019-05-15 MED ORDER — DILTIAZEM HCL 25 MG/5ML IV SOLN
10.0000 mg | Freq: Once | INTRAVENOUS | Status: AC
  Administered 2019-05-15: 10 mg via INTRAVENOUS
  Filled 2019-05-15: qty 5

## 2019-05-15 MED ORDER — INSULIN ASPART 100 UNIT/ML ~~LOC~~ SOLN
0.0000 [IU] | SUBCUTANEOUS | Status: DC
  Administered 2019-05-16: 1 [IU] via SUBCUTANEOUS
  Administered 2019-05-16: 3 [IU] via SUBCUTANEOUS
  Administered 2019-05-16: 1 [IU] via SUBCUTANEOUS
  Administered 2019-05-16: 20:00:00 2 [IU] via SUBCUTANEOUS
  Administered 2019-05-17: 1 [IU] via SUBCUTANEOUS
  Administered 2019-05-17: 2 [IU] via SUBCUTANEOUS
  Administered 2019-05-17: 1 [IU] via SUBCUTANEOUS
  Administered 2019-05-17: 18:00:00 3 [IU] via SUBCUTANEOUS
  Administered 2019-05-17: 08:00:00 1 [IU] via SUBCUTANEOUS
  Administered 2019-05-17: 3 [IU] via SUBCUTANEOUS
  Administered 2019-05-18 (×2): 2 [IU] via SUBCUTANEOUS
  Administered 2019-05-18: 1 [IU] via SUBCUTANEOUS
  Administered 2019-05-18: 3 [IU] via SUBCUTANEOUS
  Administered 2019-05-18: 2 [IU] via SUBCUTANEOUS
  Administered 2019-05-19 (×2): 3 [IU] via SUBCUTANEOUS
  Administered 2019-05-19: 1 [IU] via SUBCUTANEOUS
  Administered 2019-05-19: 17:00:00 5 [IU] via SUBCUTANEOUS
  Administered 2019-05-20: 3 [IU] via SUBCUTANEOUS
  Administered 2019-05-20: 2 [IU] via SUBCUTANEOUS
  Administered 2019-05-20 (×2): 1 [IU] via SUBCUTANEOUS
  Filled 2019-05-15 (×20): qty 1

## 2019-05-15 NOTE — Progress Notes (Addendum)
PCCM ATTENDING ATTESTATION:  I have evaluated patient with the APP, I personally  reviewed database in its entirety and discussed care plan in detail. In addition, this patient was discussed on multidisciplinary rounds.   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  Continue to monitor in ICU/SDU Need to address goals of care   Critical Care Time devoted to patient care services described in this note is 32 minutes.   Overall, patient is critically ill, prognosis is guarded.  Patient with Multiorgan failure and at high risk for cardiac arrest and death.     Ottie Glazier, M.D.  [facility]           Name: Kirk Robinson MRN: [id number] DOB: [birth year]     CONSULTATION DATE: 05/15/2019  CHIEF COMPLAINT:  Type 2 diabetes mellitus with hyperosmolar nonketotic hyperglycemia  SIGNIFICANT EVENTS/STUDIES:  09/19 admitted with altered mental status and hyperosmolar nonketotic state 09/19 CT scan of the head without evidence of hydrocephalus 09/20 glycemic control improving, still with altered mental status 09/21 EEG showed diffuse encephalopathy 09/22 Pt went into SVT with possible a fib or flutter requiring 62m adenosine 09/22 MRI showed no evidence of acute intracranial abnormality  HISTORY OF PRESENT ILLNESS:   Kirk Robinson is a 63yo male with history of tobacco abuse, COPD, T2DM, pulmonary fibrosis with multiple episodes of CAP, CVA, hypothyroidism, and hydrocephalus requiring VP shunt. He presented to the ED 9/19 with altered mental status over the past week,  weakness, and increasing SOB (wears 3L O2 as baseline at home). Blood glucose was elevated at 883 upon arrival. Admitted to the stepdown unit for insulin infusion administration.  On 9/22, patient had an episode of SVT and hypotension which was broken with 635madenosine rapid push. Neurology consult indicates that he is slowly improving.  PAST MEDICAL HISTORY :   has a past medical history of Alcohol use (03/16/2007), Allergic rhinitis, cause unspecified, Altered mental status, Anxiety state, unspecified, Cervicalgia (09/17/2007), COPD (chronic obstructive pulmonary disease) ([place] Diabetes mellitus without complication ([facility] Diplopia (12/21/2007), Esophageal reflux, Gastritis (05/13/2009), HCAP (healthcare-associated pneumonia) (08/12/2014), Hematuria (03/16/2007), Hyperthyroidism, Iodine hypothyroidism, Memory loss, NSIP (nonspecific interstitial pneumonia) ([facility] Osteoporosis, unspecified, Other abnormal glucose (04/08/2008), Other diseases of lung, not elsewhere classified (01/23/2009), Peripheral vascular disease, unspecified ([facility](12/24/2009), Personal history of colonic polyps, Pneumonia, organism unspecified(486), Pure hypercholesterolemia, Shortness of breath, Stroke ([facility](2005), Tobacco use disorder (03/16/2007), Unspecified hypothyroidism (01/07/2010), and 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  has a past surgical history that includes Eye surgery (2010); Brain surgery (2005); Admission 11/2011; Neuropsychiatric evaluation (04/2012); and Laparoscopic revision ventricular-peritoneal (v-p) shunt (Right, 05/20/2014). Prior to Admission medications   Medication Sig Start Date End Date Taking? Authorizing Provider  albuterol (PROVENTIL HFA;VENTOLIN HFA) 108 (90 BASE) MCG/ACT inhaler Inhale 2 puffs into the lungs every 6 (six) hours as needed for wheezing or shortness of breath.   Yes [provider]  atorvastatin (LIPITOR) 20 MG tablet Take 1 tablet (20 mg total)  by mouth daily at 6 PM. 10/24/17  Yes SmWardell HonourMD  blood glucose meter kit and  supplies KIT Check sugar once daily for Dx: E11.9 08/06/15  Yes [name], MD  diazepam (VALIUM) 5 MG tablet Take 5 mg by mouth 2 (two) times daily.  04/25/14  Yes [provider]  diltiazem (CARDIZEM CD) 120 MG 24 hr capsule TAKE 1 CAPSULE (120 MG TOTAL) BY MOUTH DAILY. Patient taking differently: Take 120 mg by mouth daily.  10/24/17  Yes [name], MD  glucose blood test strip Check sugar once daily. Dx: E11.9 10/18/17  Yes [name], MD  Lancets (FREESTYLE) lancets Check sugars once daily.  Dx: E11.9 10/18/17  Yes [name], MD  metFORMIN (GLUCOPHAGE) 1000 MG tablet TAKE 1 TABLET BY MOUTH 2 TIMES DAILY WITH MEAL. Patient taking differently: Take 1,000 mg by mouth 2 (two) times daily with a meal.  03/16/18  Yes [name], MD  morphine (MS CONTIN) 60 MG 12 hr tablet Take 60 mg by mouth every 8 (eight) hours. 03/27/15  Yes [provider]  predniSONE (DELTASONE) 10 MG tablet Take 20 mg by mouth daily with breakfast.    Yes [provider]  predniSONE (DELTASONE) 10 MG tablet Take 10 mg by mouth every evening.  11/13/17  Yes [provider]  Tapentadol HCl (NUCYNTA) 75 MG TABS Take 75 mg by mouth every 4 (four) hours as needed for moderate pain or severe pain.    Yes [provider]  thyroid (ARMOUR) 90 MG tablet Take 90 mg by mouth daily at 2 PM daily at 2 PM.    Yes [provider]  venlafaxine XR (EFFEXOR-XR) 150 MG 24 hr capsule Take 1 capsule (150 mg total) by mouth daily with breakfast. 01/24/18  Yes [name], MD  escitalopram (LEXAPRO) 10 MG tablet Take 1 tablet (10 mg total) by mouth daily. Office visit needed for refills Patient not taking: Reported on 05/11/2019 10/18/17   [name], MD  omeprazole (PRILOSEC) 40 MG capsule TAKE 1 CAPSULE (40 MG TOTAL) BY MOUTH DAILY. Patient not taking: Reported on 05/11/2019 10/18/17    [name], MD   Allergies  Allergen Reactions  . Advair Diskus [Fluticasone-Salmeterol] Shortness Of Breath  . Erythromycin Nausea And Vomiting  . Iodinated Diagnostic Agents Shortness Of Breath  . Ativan [Lorazepam] Other (See Comments)    Caused pt's heart to stop  . Darvocet [Propoxyphene N-Acetaminophen] Nausea And Vomiting  . Doxycycline Hyclate Nausea And Vomiting, Swelling and Other (See Comments)    Tongue swelling, severe depression  . Propoxyphene Nausea And Vomiting  . Septra [Sulfamethoxazole-Trimethoprim] Rash    REVIEW OF SYSTEMS:   Unable to obtain due to altered mental status.   VITAL SIGNS: Temp:  [97.9 F (36.6 C)-98.8 F (37.1 C)] 98.8 F (37.1 C) (09/23 0800) Pulse Rate:  [89-147] 143 (09/23 1000) Resp:  [11-25] 20 (09/23 1000) BP: (79-116)/(55-84) 97/76 (09/23 0924) SpO2:  [91 %-100 %] 99 % (09/23 1000)   I/O last 3 completed shifts: In: 1524.1 [I.V.:634.1; IV Piggyback:890] Out: -  Total I/O In: 3.8 [I.V.:3.8] Out: -    SpO2: 99 % O2 Flow Rate (L/min): 2 L/min   Physical Examination:  GENERAL:appears altered, not responding appropriately, no resp distress HEAD: Normocephalic, atraumatic.  EYES: Pupils equal, round, reactive to light.  No scleral icterus.  MOUTH: Moist mucosal membrane. NECK: Supple. No JVD.  PULMONARY: lungs clear to auscultation CARDIOVASCULAR: S1 and S2. Regular rate and rhythm. No murmurs, rubs, or gallops.  GASTROINTESTINAL: Soft, nontender, -distended. No masses. Positive  bowel sounds. No hepatosplenomegaly.  MUSCULOSKELETAL: No swelling, clubbing, or edema.  NEUROLOGIC: opens eyes to voice with some eye contact, responds inconsistently to questions. More awake than yesterday, but still not back to baseline SKIN:intact,warm,dry  I personally reviewed lab work that was obtained in last 24 hrs. CXR Independently reviewed  MEDICATIONS: I have reviewed all medications and confirmed regimen as documented    CULTURE RESULTS   Recent Results (from the past 240 hour(s))  SARS CORONAVIRUS 2 (TAT 6-24 HRS) Nasopharyngeal Nasopharyngeal Swab     Status: None   Collection Time: 05/11/19 12:56 PM   Specimen: Nasopharyngeal Swab  Result Value Ref Range Status   SARS Coronavirus 2 NEGATIVE NEGATIVE Final    Comment: (NOTE) SARS-CoV-2 target nucleic acids are NOT DETECTED. The SARS-CoV-2 RNA is generally detectable in upper and lower respiratory specimens during the acute phase of infection. Negative results do not preclude SARS-CoV-2 infection, do not rule out co-infections with other pathogens, and should not be used as the sole basis for treatment or other patient management decisions. Negative results must be combined with clinical observations, patient history, and epidemiological information. The expected result is Negative. Fact Sheet for Patients: SugarRoll.be Fact Sheet for Healthcare Providers: https://www.woods-mathews.com/ This test is not yet approved or cleared by the [place] FDA and  has been authorized for detection and/or diagnosis of SARS-CoV-2 by FDA under an Emergency Use Authorization (EUA). This EUA will remain  in effect (meaning this test can be used) for the duration of the COVID-19 declaration under Section 56 4(b)(1) of the Act, 21 U.S.C. section 360bbb-3(b)(1), unless the authorization is terminated or revoked sooner. Performed at [facility], [place] [address], [place], [place] [address]   MRSA PCR Screening     Status: None   Collection Time: 05/11/19  4:55 PM   Specimen: Nasopharyngeal  Result Value Ref Range Status   MRSA by PCR NEGATIVE NEGATIVE Final    Comment:        The GeneXpert MRSA Assay (FDA approved for NASAL specimens only), is one component of a comprehensive MRSA colonization surveillance program. It is not intended to diagnose MRSA infection nor to guide or monitor treatment for MRSA  infections. Performed at [facility], [address] [place], [place] [address]           IMAGING    Dg Skull 1-3 Views  Result Date: 05/14/2019 CLINICAL DATA:  Shunt malfunction study EXAM: SKULL - 1-3 VIEW COMPARISON:  None. FINDINGS: There is no evidence of skull fracture or other focal bone lesions. Ventriculoperitoneal shunt catheter with the tubing intact. IMPRESSION: Ventriculoperitoneal shunt catheter with the tubing intact. Electronically Signed   By: Kathreen Devoid   On: 05/14/2019 17:00   Mr Brain W Wo Contrast  Result Date: 05/14/2019 CLINICAL DATA:  Encephalopathy. Additional history provided: Admitted with hyperglycemic hyperosmolar state which was treated, but patient remains encephalopathic. History of COPD, prior hemorrhagic stroke, VP shunt. EXAM: MRI HEAD WITHOUT AND WITH CONTRAST TECHNIQUE: Multiplanar, multiecho pulse sequences of the brain and surrounding structures were obtained without and with intravenous contrast. CONTRAST:  77m GADAVIST GADOBUTROL 1 MMOL/ML IV SOLN COMPARISON:  Noncontrast head CT 05/11/2019,, brain MRI 01/12/2012 (report unavailable), brain MRI 04/09/2004 FINDINGS: Brain: Multiple sequences are motion degraded, including postcontrast imaging. Susceptibility artifact arising from external components of a left-sided shunt system obscures portions of the anterior left cerebral hemisphere on multiple sequences, particularly on the DWI and gradient sequences. Within this limitation, no convincing evidence  of acute infarction. Unchanged position of a left frontal approach ventricular catheter. No ventriculomegaly. No evidence of intracranial mass. No midline shift or extra-axial fluid collection. Redemonstrated gliosis along site of left frontal approach ventricular catheter and prior right frontal approach ventricular catheter. Additional mild scattered T2/FLAIR hyperintensity within the cerebral white matter is nonspecific, but consistent with  chronic small vessel ischemic disease. Chronic blood products along the medial thalami and posterior third ventricle at site of remote hemorrhage. Cerebral volume is normal for age. No abnormal intracranial enhancement identified on motion degraded postcontrast imaging. Vascular: Flow voids maintained within the proximal large arterial vessels. Skull and upper cervical spine: Normal marrow signal. Sinuses/Orbits: Visualized orbits demonstrate no acute abnormality. Trace mucosal thickening within posterior left ethmoid air cells. No significant mastoid effusion. IMPRESSION: 1. Motion degraded examination. Susceptibility artifact from the left-sided shunt obscures portions of the anterior left cerebral hemisphere. 2. Within described limitations, no evidence of acute intracranial abnormality, including acute infarct. 3. Stable left frontal approach ventricular catheter. No ventriculomegaly. 4. Mild chronic small vessel ischemic disease. 5. Chronic hemosiderin along the medial thalami and posterior third ventricle at site of remote hemorrhage. Electronically Signed   By: Kellie Simmering   On: 05/14/2019 14:41     ASSESSMENT AND PLAN SYNOPSIS 63 yo male with history of CVA, hypothyroidism, GERD, T2 DM, COPD, alcohol abuse, and hydrocephaly who presented to ED due to AMS with hyperosmolar nonketotic hyperglycemia. He has continued altered mental status and encephalopathy on EEG.  HYPEROSMOLAR NONKETOTIC HYPERGLYCEMIA - resolved - continue sliding scale insulin as indicated  ALTERED MENTAL STATUS/ENCEPHALOPATHY - continue to monitor mental status - continue to monitor electrolytes for any changes - MRI showed no evidence of acute pathology  CARDIAC - Intermittent tachycardia/SVT - ICU monitoring - treat acutely as needed  GI GI PROPHYLAXIS as indicated  ENDO - will use ICU hypoglycemic\Hyperglycemia protocol if needed  ELECTROLYTES -follow labs as needed -replace as needed -pharmacy consultation  and following   DVT/GI PRX ordered TRANSFUSIONS AS NEEDED MONITOR FSBS ASSESS the need for LABS    Critical Care Time devoted to patient care services described in this note is 40 minutes.   Overall, patient is critically ill, prognosis is guarded.  Patient with Multiorgan failure and at high risk for cardiac arrest and death.   Nira Conn, PA-S

## 2019-05-15 NOTE — Progress Notes (Signed)
Inpatient Diabetes Program Recommendations  AACE/ADA: New Consensus Statement on Inpatient Glycemic Control (2015)  Target Ranges:  Prepandial:   less than 140 mg/dL      Peak postprandial:   less than 180 mg/dL (1-2 hours)      Critically ill patients:  140 - 180 mg/dL   Lab Results  Component Value Date   GLUCAP 80 05/15/2019   HGBA1C 13.2 (H) 05/11/2019    Review of Glycemic Control Results for Kirk Robinson, Kirk Robinson (MRN [id number]) as of 05/15/2019 10:14  Ref. Range 05/14/2019 16:15 05/14/2019 19:27 05/15/2019 00:07 05/15/2019 03:55 05/15/2019 07:35  Glucose-Capillary Latest Ref Range: 70 - 99 mg/dL 183 (H) 154 (H) 99 76 80   Inpatient Diabetes Program Recommendations:   As steroids are now decreased, consider: -Decrease Levemir to 5 units  -Decrease Novolog correction to sensitive  Thank you, Bethena Roys E. Jasneet Schobert, RN, MSN, CDE  Diabetes Coordinator Inpatient Glycemic Control Team Team Pager [phone] (8am-5pm) 05/15/2019 10:15 AM

## 2019-05-15 NOTE — Consult Note (Signed)
PHARMACY CONSULT NOTE - FOLLOW UP  Pharmacy Consult for Electrolyte Monitoring and Replacement   Recent Labs: Potassium (mmol/L)  Date Value  05/15/2019 3.8  05/11/2014 4.5   Magnesium (mg/dL)  Date Value  05/15/2019 2.3   Calcium (mg/dL)  Date Value  05/15/2019 8.4 (L)   Calcium, Total (mg/dL)  Date Value  05/11/2014 8.8   Albumin (g/dL)  Date Value  05/11/2019 3.7  01/24/2018 4.1  05/05/2014 2.7 (L)   Phosphorus (mg/dL)  Date Value  05/13/2019 4.1   Sodium (mmol/L)  Date Value  05/15/2019 138  01/24/2018 142  05/11/2014 134 (L)     Assessment: 63 y/o M admitted 9/19 with HHS and AMS. Patient history significant for hydrocephalus requiring VP shunt, COPD, T2DM, pulmonary fibrosis, hemorrhagic stroke, hypothyroidism.   9/21 EEG revealing for diffuse encephalopathy. 9/22 MRI showed no acute abnormality. Patient with multiple runs of SVT this admission requiring adenosine. He is currently on rate control with IV digoxin and received IV diltiazem 10 mg this morning. Avoiding amiodarone due to history of shortness of breath with iodinated contrast agents.   Goal of Therapy:  K ~4 , Mg ~2 , Phos 2.5 - 4.5  Plan:  --IV potassium 10 mEq x3 --BMP tomorrow AM  [place] Resident 05/15/2019 11:55 AM

## 2019-05-15 NOTE — Progress Notes (Signed)
[facility] NAME: Kirk Robinson    MR#:  [id number]  DATE OF BIRTH:  [birth year]  SUBJECTIVE:   Mental status slightly improved. Noted to be in SVT with HR in the 130's this a.m.   REVIEW OF SYSTEMS:    Review of Systems  Unable to perform ROS: Mental acuity    Nutrition: NPO Tolerating Diet: No Tolerating PT: Await Eval.   DRUG ALLERGIES:   Allergies  Allergen Reactions   Advair Diskus [Fluticasone-Salmeterol] Shortness Of Breath   Erythromycin Nausea And Vomiting   Iodinated Diagnostic Agents Shortness Of Breath   Ativan [Lorazepam] Other (See Comments)    Caused pt's heart to stop   Darvocet [Propoxyphene N-Acetaminophen] Nausea And Vomiting   Doxycycline Hyclate Nausea And Vomiting, Swelling and Other (See Comments)    Tongue swelling, severe depression   Propoxyphene Nausea And Vomiting   Septra [Sulfamethoxazole-Trimethoprim] Rash    VITALS:  Blood pressure 97/76, pulse (!) 107, temperature 98.8 F (37.1 C), temperature source Oral, resp. rate 14, height 5\' 9"  (1.753 m), weight 65.6 kg, SpO2 95 %.  PHYSICAL EXAMINATION:   Physical Exam  GENERAL:  63 y.o.-year-old patient lying in bed lethargic/encephalopathic but follows some commands.  EYES: Pupils equal, round, reactive to light and accommodation. No scleral icterus. Extraocular muscles intact.  HEENT: Head atraumatic, normocephalic. Dry Oral Mucosa NECK:  Supple, no jugular venous distention. No thyroid enlargement, no tenderness.  LUNGS: Poor Resp. effort, no wheezing, rales, rhonchi.  No use of accessory muscles of respiration.  CARDIOVASCULAR: S1, S2 normal. No murmurs, rubs, or gallops.  ABDOMEN: Soft, nontender, nondistended. Bowel sounds present. No organomegaly or mass.  EXTREMITIES: No cyanosis, clubbing or edema b/l.    NEUROLOGIC: Cranial nerves II through XII are intact. No focal Motor or sensory deficits b/l.  Globally weak &  Encephalopathic.  PSYCHIATRIC: The patient is alert and oriented x 1.  SKIN: No obvious rash, lesion, or ulcer.    LABORATORY PANEL:   CBC Recent Labs  Lab 05/15/19 0610  WBC 10.7*  HGB 14.9  HCT 43.2  PLT 265   ------------------------------------------------------------------------------------------------------------------  Chemistries  Recent Labs  Lab 05/11/19 1159  05/15/19 0610  NA 135   < > 138  K 4.0   < > 3.8  CL 92*   < > 104  CO2 30   < > 22  GLUCOSE 883*   < > 84  BUN 19   < > 20  CREATININE 1.14   < > 0.74  CALCIUM 10.3   < > 8.4*  MG  --    < > 2.3  AST 21  --   --   ALT 30  --   --   ALKPHOS 152*  --   --   BILITOT 0.7  --   --    < > = values in this interval not displayed.   ------------------------------------------------------------------------------------------------------------------  Cardiac Enzymes No results for input(s): TROPONINI in the last 168 hours. ------------------------------------------------------------------------------------------------------------------  RADIOLOGY:  Dg Skull 1-3 Views  Result Date: 05/14/2019 CLINICAL DATA:  Shunt malfunction study EXAM: SKULL - 1-3 VIEW COMPARISON:  None. FINDINGS: There is no evidence of skull fracture or other focal bone lesions. Ventriculoperitoneal shunt catheter with the tubing intact. IMPRESSION: Ventriculoperitoneal shunt catheter with the tubing intact. Electronically Signed   By: Kathreen Devoid   On: 05/14/2019 17:00   Dg Skull 1-3 Views  Result Date: 05/13/2019 CLINICAL DATA:  VP shunt EXAM: SKULL - 1-3 VIEW COMPARISON:  08/12/2014 FINDINGS: Right frontal VP shunt tubing unchanged in position. No areas of disconnection or discontinuity. Normal calvarium.  Paranasal sinuses clear IMPRESSION: Right frontal VP shunt unchanged from prior studies. Electronically Signed   By: Franchot Gallo M.D.   On: 05/13/2019 19:12   Mr Jeri Cos [id number] Contrast  Result Date: 05/14/2019 CLINICAL DATA:   Encephalopathy. Additional history provided: Admitted with hyperglycemic hyperosmolar state which was treated, but patient remains encephalopathic. History of COPD, prior hemorrhagic stroke, VP shunt. EXAM: MRI HEAD WITHOUT AND WITH CONTRAST TECHNIQUE: Multiplanar, multiecho pulse sequences of the brain and surrounding structures were obtained without and with intravenous contrast. CONTRAST:  66mL GADAVIST GADOBUTROL 1 MMOL/ML IV SOLN COMPARISON:  Noncontrast head CT 05/11/2019,, brain MRI 01/12/2012 (report unavailable), brain MRI 04/09/2004 FINDINGS: Brain: Multiple sequences are motion degraded, including postcontrast imaging. Susceptibility artifact arising from external components of a left-sided shunt system obscures portions of the anterior left cerebral hemisphere on multiple sequences, particularly on the DWI and gradient sequences. Within this limitation, no convincing evidence of acute infarction. Unchanged position of a left frontal approach ventricular catheter. No ventriculomegaly. No evidence of intracranial mass. No midline shift or extra-axial fluid collection. Redemonstrated gliosis along site of left frontal approach ventricular catheter and prior right frontal approach ventricular catheter. Additional mild scattered T2/FLAIR hyperintensity within the cerebral white matter is nonspecific, but consistent with chronic small vessel ischemic disease. Chronic blood products along the medial thalami and posterior third ventricle at site of remote hemorrhage. Cerebral volume is normal for age. No abnormal intracranial enhancement identified on motion degraded postcontrast imaging. Vascular: Flow voids maintained within the proximal large arterial vessels. Skull and upper cervical spine: Normal marrow signal. Sinuses/Orbits: Visualized orbits demonstrate no acute abnormality. Trace mucosal thickening within posterior left ethmoid air cells. No significant mastoid effusion. IMPRESSION: 1. Motion degraded  examination. Susceptibility artifact from the left-sided shunt obscures portions of the anterior left cerebral hemisphere. 2. Within described limitations, no evidence of acute intracranial abnormality, including acute infarct. 3. Stable left frontal approach ventricular catheter. No ventriculomegaly. 4. Mild chronic small vessel ischemic disease. 5. Chronic hemosiderin along the medial thalami and posterior third ventricle at site of remote hemorrhage. Electronically Signed   By: Kellie Simmering   On: 05/14/2019 14:41     ASSESSMENT AND PLAN:   63 year old male with past medical history of previous Hemorrhagic CVA, hypothyroidism, GERD, diabetes, COPD, anxiety/depression, history of alcohol abuse who presented to the hospital due to altered mental status and noted to be in nonketotic hyperosmolar state.  1.  Altered mental status/encephalopathy- Metabolic and related to hyperglycemia on admission.  Patient does have a history of previous CVA but CT head admission was negative. -No acute infectious source, EEG was negative for seizures, MRI of the brain negative for acute pathology. -Seen by both neurology and neurosurgery and no further neurologic imaging needed.  Mental status is slowly starting to improve.  As per neurology patient takes longer to improve after any metabolic derangement as this is happened in the past. - should continue to improve over time.   2.  Nonketotic hyperglycemia- much improved and off Insulin gtt.  -Continue Lantus, sliding scale insulin for now.  Blood sugars much improved.  3.  History of hemorrhagic CVA- patient has a previous VP shunt and CT/MRI (-) for acute pathology.  -Neurology and neurosurgery following.  No acute surgical indication.  4.  Hyperlipidemia-continue atorvastatin.  5.  Essential hypertension-continue Cardizem  6.  Chronic pain-continue Nucynta, MS Contin.  7.  Hypothyroidism-continue Armour Thyroid.  8.  Depression-continue Effexor.  9.   COPD-no acute exacerbation.  Continue maintenance prednisone, albuterol nebulizers as needed.  All the records are reviewed and case discussed with Care Management/Social Worker. Management plans discussed with the patient, family and they are in agreement.  CODE STATUS: DNR  DVT Prophylaxis: Lovenox  TOTAL TIME TAKING CARE OF THIS PATIENT: 30 minutes.   POSSIBLE D/C IN 3-4 DAYS, DEPENDING ON CLINICAL CONDITION.   Henreitta Leber M.D on 05/15/2019 at 2:29 PM  Between 7am to 6pm - Pager - [phone]  After 6pm go to [email or web address] - Proofreader  [facility]  Office  [phone]  CC: Primary care physician; [name], MD

## 2019-05-15 NOTE — Progress Notes (Signed)
PT Cancellation Note  Patient Details Name: Kirk Robinson MRN: [id number] DOB: 01/18/1956   Cancelled Treatment:    Reason Eval/Treat Not Completed: Other (comment)(PT consult recieved, chart reviewed. PT visualized HR in the 130s on telemetry, pt sleeping in bed. MD notified of HR during rounds. PT will follow up when patient is more appropriate for exertional activity.)  Lieutenant Diego PT, DPT 10:58 AM,05/15/19 [phone]

## 2019-05-15 NOTE — Progress Notes (Signed)
Nutrition Follow-up  DOCUMENTATION CODES:   Not applicable  INTERVENTION:  Will continue to monitor for diet advancement.  Once diet advanced provide Ensure Max Protein po BID (150 kcal and 30 grams of protein per bottle) and MVI daily.  If diet unable to be advanced after 7-10 days of NPO status (9/26-9/29) consider initiation of nutrition support at that time.  NUTRITION DIAGNOSIS:   Inadequate oral intake related to lethargy/confusion, inability to eat as evidenced by NPO status.  Ongoing.  GOAL:   Patient will meet greater than or equal to 90% of their needs  Not progressing.  MONITOR:   Diet advancement, PO intake, Supplement acceptance, Labs, Weight trends, I & O's  REASON FOR ASSESSMENT:   Malnutrition Screening Tool    ASSESSMENT:   63 year old male with PMHx of memory loss, OP, hypothyroidism, anxiety, EtOH abuse, PVD, COPD, hx hemorrhagic CVA s/p VP shunt, DM admitted with AMS, hyperosmolar nonketotic state.  Patient remains NPO except sips with meds and is unsafe for PO intake still. Per RN he spit out his morning meds today and would not take any by mouth. Per discussion on rounds patient may be having improvement in mental status. Plan is to continue monitoring and give patient time for improvement. Able to complete NFPE today and patient does not meet criteria for malnutrition at this time.  Medications reviewed and include: Novolog 0-9 units Q4hrs, Lantus 5 units QHS, pantoprazole, prednisone 20 mg daily, thiamine 100 mg daily, thyroid, potassium chloride 10 mEq IV x 3 today.  Labs reviewed: CBG 76-99.  NUTRITION - FOCUSED PHYSICAL EXAM:    Most Recent Value  Orbital Region  No depletion  Upper Arm Region  Mild depletion  Thoracic and Lumbar Region  No depletion  Buccal Region  No depletion  Temple Region  No depletion  Clavicle Bone Region  No depletion  Clavicle and Acromion Bone Region  No depletion  Scapular Bone Region  No depletion  Dorsal  Hand  No depletion  Patellar Region  Mild depletion  Anterior Thigh Region  Mild depletion  Posterior Calf Region  Mild depletion  Edema (RD Assessment)  None  Hair  Reviewed  Eyes  Unable to assess  Mouth  Unable to assess  Skin  Reviewed  Nails  Reviewed     Diet Order:   Diet Order            Diet NPO time specified Except for: Sips with Meds  Diet effective now             EDUCATION NEEDS:   Not appropriate for education at this time  Skin:  Skin Assessment: Reviewed RN Assessment  Last BM:  05/15/2019 - small type 2  Height:   Ht Readings from Last 1 Encounters:  05/11/19 5\' 9"  (1.753 m)   Weight:   Wt Readings from Last 1 Encounters:  05/11/19 65.6 kg   Ideal Body Weight:  72.7 kg  BMI:  Body mass index is 21.36 kg/m.  Estimated Nutritional Needs:   Kcal:  1800-2000  Protein:  90-100 grams  Fluid:  1.8-2 L/day  Willey Blade, MS, RD, LDN Office: [phone] Pager: [phone] After Hours/Weekend Pager: [phone]

## 2019-05-15 NOTE — Progress Notes (Signed)
Subjective: Patient further improved today.  Not yet back to baseline.     Objective: Current vital signs: BP 97/76   Pulse (!) 143   Temp 98.8 F (37.1 C) (Oral)   Resp 20   Ht 5\' 9"  (1.753 m)   Wt 65.6 kg   SpO2 99%   BMI 21.36 kg/m  Vital signs in last 24 hours: Temp:  [97.9 F (36.6 C)-98.8 F (37.1 C)] 98.8 F (37.1 C) (09/23 0800) Pulse Rate:  [89-147] 143 (09/23 1000) Resp:  [11-29] 20 (09/23 1000) BP: (79-116)/(55-84) 97/76 (09/23 0924) SpO2:  [91 %-100 %] 99 % (09/23 1000)  Intake/Output from previous day: 09/22 0701 - 09/23 0700 In: 1274.1 [I.V.:634.1; IV Piggyback:640] Out: -  Intake/Output this shift: Total I/O In: 3.8 [I.V.:3.8] Out: -  Nutritional status:  Diet Order            Diet NPO time specified Except for: Sips with Meds  Diet effective now              Neurologic Exam: Mental Status: Alert.  Able to tell me his first name but not his last name.  Follows some simple commands.  Questionable left neglect.   Cranial Nerves: [id number] to bilateral confrontation. . III,IV, [id number] oculocephalic maneuver responses V,VII:Intact corneal bilaterally VIII: hearing normal bilaterally IX,X: gag reflexunable to be tested XI: bilateral shoulder shrugunable to be tested [id number] tongue extension Motor: Moves all extremities  Lab Results: Basic Metabolic Panel: Recent Labs  Lab 05/12/19 0029 05/12/19 0400 05/12/19 2208 05/13/19 0622 05/14/19 0438 05/14/19 0444 05/15/19 0610  NA 142 142 138 133*  --  137 138  K 3.9 3.4* 3.6 3.8  --  3.5 3.8  CL 104 103 101 98  --  102 104  CO2 31 31 27 24   --  24 22  GLUCOSE 156* 179* 174* 148*  --  120* 84  BUN 16 15 8 8   --  14 20  CREATININE 0.63 0.58* 0.85 0.65  --  0.91 0.74  CALCIUM 8.6* 8.6* 8.7* 8.9  --  8.6* 8.4*  MG 1.9 1.9 1.8 1.7 1.9  --  2.3  PHOS 2.9 2.1* 2.1* 4.1  --   --   --     Liver Function Tests: Recent Labs  Lab 05/11/19 1159  AST 21  ALT 30  ALKPHOS 152*   BILITOT 0.7  PROT 6.8  ALBUMIN 3.7   No results for input(s): LIPASE, AMYLASE in the last 168 hours. Recent Labs  Lab 05/13/19 1249  AMMONIA 20    CBC: Recent Labs  Lab 05/11/19 1159 05/12/19 0400 05/13/19 0622 05/15/19 0610  WBC 8.1 8.1 9.9 10.7*  NEUTROABS 6.6  --   --   --   HGB 13.4 12.2* 14.6 14.9  HCT 38.6* 35.0* 41.0 43.2  MCV 88.1 87.5 86.3 87.6  PLT 234 193 267 265    Cardiac Enzymes: No results for input(s): CKTOTAL, CKMB, CKMBINDEX, TROPONINI in the last 168 hours.  Lipid Panel: No results for input(s): CHOL, TRIG, HDL, CHOLHDL, VLDL, LDLCALC in the last 168 hours.  CBG: Recent Labs  Lab 05/14/19 1615 05/14/19 1927 05/15/19 0007 05/15/19 0355 05/15/19 0735  GLUCAP 183* 154* 99 76 80    Microbiology: Results for orders placed or performed during the hospital encounter of 05/11/19  SARS CORONAVIRUS 2 (TAT 6-24 HRS) Nasopharyngeal Nasopharyngeal Swab     Status: None   Collection Time: 05/11/19 12:56 PM   Specimen: Nasopharyngeal Swab  Result Value Ref Range Status   SARS Coronavirus 2 NEGATIVE NEGATIVE Final    Comment: (NOTE) SARS-CoV-2 target nucleic acids are NOT DETECTED. The SARS-CoV-2 RNA is generally detectable in upper and lower respiratory specimens during the acute phase of infection. Negative results do not preclude SARS-CoV-2 infection, do not rule out co-infections with other pathogens, and should not be used as the sole basis for treatment or other patient management decisions. Negative results must be combined with clinical observations, patient history, and epidemiological information. The expected result is Negative. Fact Sheet for Patients: SugarRoll.be Fact Sheet for Healthcare Providers: https://www.woods-mathews.com/ This test is not yet approved or cleared by the [place] FDA and  has been authorized for detection and/or diagnosis of SARS-CoV-2 by FDA under an Emergency Use  Authorization (EUA). This EUA will remain  in effect (meaning this test can be used) for the duration of the COVID-19 declaration under Section 56 4(b)(1) of the Act, 21 U.S.C. section 360bbb-3(b)(1), unless the authorization is terminated or revoked sooner. Performed at [facility], [place] [address] [place], [place] [address]   MRSA PCR Screening     Status: None   Collection Time: 05/11/19  4:55 PM   Specimen: Nasopharyngeal  Result Value Ref Range Status   MRSA by PCR NEGATIVE NEGATIVE Final    Comment:        The GeneXpert MRSA Assay (FDA approved for NASAL specimens only), is one component of a comprehensive MRSA colonization surveillance program. It is not intended to diagnose MRSA infection nor to guide or monitor treatment for MRSA infections. Performed at [facility], [address] [place], [place] [address]     Coagulation Studies: No results for input(s): LABPROT, INR in the last 72 hours.  Imaging: Dg Skull 1-3 Views  Result Date: 05/14/2019 CLINICAL DATA:  Shunt malfunction study EXAM: SKULL - 1-3 VIEW COMPARISON:  None. FINDINGS: There is no evidence of skull fracture or other focal bone lesions. Ventriculoperitoneal shunt catheter with the tubing intact. IMPRESSION: Ventriculoperitoneal shunt catheter with the tubing intact. Electronically Signed   By: Kathreen Devoid   On: 05/14/2019 17:00   Dg Skull 1-3 Views  Result Date: 05/13/2019 CLINICAL DATA:  VP shunt EXAM: SKULL - 1-3 VIEW COMPARISON:  08/12/2014 FINDINGS: Right frontal VP shunt tubing unchanged in position. No areas of disconnection or discontinuity. Normal calvarium.  Paranasal sinuses clear IMPRESSION: Right frontal VP shunt unchanged from prior studies. Electronically Signed   By: Franchot Gallo M.D.   On: 05/13/2019 19:12   Mr Jeri Cos [id number] Contrast  Result Date: 05/14/2019 CLINICAL DATA:  Encephalopathy. Additional history provided: Admitted with hyperglycemic hyperosmolar state  which was treated, but patient remains encephalopathic. History of COPD, prior hemorrhagic stroke, VP shunt. EXAM: MRI HEAD WITHOUT AND WITH CONTRAST TECHNIQUE: Multiplanar, multiecho pulse sequences of the brain and surrounding structures were obtained without and with intravenous contrast. CONTRAST:  8mL GADAVIST GADOBUTROL 1 MMOL/ML IV SOLN COMPARISON:  Noncontrast head CT 05/11/2019,, brain MRI 01/12/2012 (report unavailable), brain MRI 04/09/2004 FINDINGS: Brain: Multiple sequences are motion degraded, including postcontrast imaging. Susceptibility artifact arising from external components of a left-sided shunt system obscures portions of the anterior left cerebral hemisphere on multiple sequences, particularly on the DWI and gradient sequences. Within this limitation, no convincing evidence of acute infarction. Unchanged position of a left frontal approach ventricular catheter. No ventriculomegaly. No evidence of intracranial mass. No midline shift or extra-axial fluid collection. Redemonstrated gliosis along site of left frontal approach  ventricular catheter and prior right frontal approach ventricular catheter. Additional mild scattered T2/FLAIR hyperintensity within the cerebral white matter is nonspecific, but consistent with chronic small vessel ischemic disease. Chronic blood products along the medial thalami and posterior third ventricle at site of remote hemorrhage. Cerebral volume is normal for age. No abnormal intracranial enhancement identified on motion degraded postcontrast imaging. Vascular: Flow voids maintained within the proximal large arterial vessels. Skull and upper cervical spine: Normal marrow signal. Sinuses/Orbits: Visualized orbits demonstrate no acute abnormality. Trace mucosal thickening within posterior left ethmoid air cells. No significant mastoid effusion. IMPRESSION: 1. Motion degraded examination. Susceptibility artifact from the left-sided shunt obscures portions of the  anterior left cerebral hemisphere. 2. Within described limitations, no evidence of acute intracranial abnormality, including acute infarct. 3. Stable left frontal approach ventricular catheter. No ventriculomegaly. 4. Mild chronic small vessel ischemic disease. 5. Chronic hemosiderin along the medial thalami and posterior third ventricle at site of remote hemorrhage. Electronically Signed   By: Kellie Simmering   On: 05/14/2019 14:41    Medications:  I have reviewed the patient's current medications. Scheduled: . atorvastatin  20 mg Oral q1800  . Chlorhexidine Gluconate Cloth  6 each Topical QHS  . diazepam  5 mg Oral BID  . diltiazem  120 mg Oral Daily  . diltiazem  10 mg Intravenous Once  . enoxaparin (LOVENOX) injection  40 mg Subcutaneous Q24H  . insulin aspart  0-9 Units Subcutaneous Q4H  . insulin glargine  5 Units Subcutaneous QHS  . morphine  30 mg Oral Q8H  . pantoprazole  40 mg Oral Daily  . predniSONE  20 mg Oral Q breakfast  . sodium chloride flush  3 mL Intravenous Once  . thiamine  100 mg Oral Daily  . thyroid  90 mg Oral QAC breakfast  . venlafaxine XR  150 mg Oral Q breakfast    Assessment/Plan: Patient continues to slowly improve.  MRI of the brain reviewed and shows no acute changes.  With patient continuing to improve no further neurological testing recommended at this time.  Will continue to follow with you.     Case discussed with Dr. Lanney Gins   LOS: 4 days   Alexis Goodell, MD Neurology [phone] 05/15/2019  10:55 AM

## 2019-05-16 LAB — BASIC METABOLIC PANEL
Anion gap: 15 (ref 5–15)
BUN: 21 mg/dL (ref 8–23)
CO2: 18 mmol/L — ABNORMAL LOW (ref 22–32)
Calcium: 8.2 mg/dL — ABNORMAL LOW (ref 8.9–10.3)
Chloride: 103 mmol/L (ref 98–111)
Creatinine, Ser: 0.91 mg/dL (ref 0.61–1.24)
GFR calc Af Amer: >60 mL/min (ref >60)
GFR calc non Af Amer: >60 mL/min (ref >60)
Glucose, Bld: 138 mg/dL — ABNORMAL HIGH (ref 70–99)
Potassium: 4.4 mmol/L (ref 3.5–5.1)
Sodium: 136 mmol/L (ref 135–145)

## 2019-05-16 LAB — CBC WITH DIFFERENTIAL/PLATELET
Abs Immature Granulocytes: 0.11 10*3/uL — ABNORMAL HIGH (ref 0.00–0.07)
Basophils Absolute: 0 10*3/uL (ref 0.0–0.1)
Basophils Relative: 0 %
Eosinophils Absolute: 0 10*3/uL (ref 0.0–0.5)
Eosinophils Relative: 0 %
HCT: 40.8 % (ref 39.0–52.0)
Hemoglobin: 14.1 g/dL (ref 13.0–17.0)
Immature Granulocytes: 1 %
Lymphocytes Relative: 6 %
Lymphs Abs: 0.6 10*3/uL — ABNORMAL LOW (ref 0.7–4.0)
MCH: 30.5 pg (ref 26.0–34.0)
MCHC: 34.6 g/dL (ref 30.0–36.0)
MCV: 88.3 fL (ref 80.0–100.0)
Monocytes Absolute: 0.5 10*3/uL (ref 0.1–1.0)
Monocytes Relative: 6 %
Neutro Abs: 8.2 10*3/uL — ABNORMAL HIGH (ref 1.7–7.7)
Neutrophils Relative %: 87 %
Platelets: 294 10*3/uL (ref 150–400)
RBC: 4.62 MIL/uL (ref 4.22–5.81)
RDW: 15.9 % — ABNORMAL HIGH (ref 11.5–15.5)
WBC: 9.5 10*3/uL (ref 4.0–10.5)
nRBC: 0 % (ref 0.0–0.2)

## 2019-05-16 LAB — GLUCOSE, CAPILLARY
Glucose-Capillary: 99 mg/dL (ref 70–99)
Glucose-Capillary: 135 mg/dL — ABNORMAL HIGH (ref 70–99)
Glucose-Capillary: 126 mg/dL — ABNORMAL HIGH (ref 70–99)
Glucose-Capillary: 102 mg/dL — ABNORMAL HIGH (ref 70–99)
Glucose-Capillary: 216 mg/dL — ABNORMAL HIGH (ref 70–99)
Glucose-Capillary: 199 mg/dL — ABNORMAL HIGH (ref 70–99)

## 2019-05-16 NOTE — Progress Notes (Signed)
[facility] [place] NAME: Kirk Robinson    MR#:  [id number]  DATE OF BIRTH:  [birth year]  SUBJECTIVE:   The status is improving and patient is following commands and is more alert today.  He feels constipated and is asking for an enema but he had multiple bowel movements as per the nursing staff earlier.  Patient's wife is at bedside.  REVIEW OF SYSTEMS:    Review of Systems  Unable to perform ROS: Mental acuity    Nutrition: Dysphagia II with thin liquids  Tolerating Diet: Yes Tolerating PT: Await Eval.   DRUG ALLERGIES:   Allergies  Allergen Reactions  . Advair Diskus [Fluticasone-Salmeterol] Shortness Of Breath  . Erythromycin Nausea And Vomiting  . Iodinated Diagnostic Agents Shortness Of Breath  . Ativan [Lorazepam] Other (See Comments)    Caused pt's heart to stop  . Darvocet [Propoxyphene N-Acetaminophen] Nausea And Vomiting  . Doxycycline Hyclate Nausea And Vomiting, Swelling and Other (See Comments)    Tongue swelling, severe depression  . Propoxyphene Nausea And Vomiting  . Septra [Sulfamethoxazole-Trimethoprim] Rash    VITALS:  Blood pressure 96/78, pulse (!) 106, temperature (!) 97.4 F (36.3 C), temperature source Oral, resp. rate 17, height 5\' 9"  (1.753 m), weight 65.6 kg, SpO2 97 %.  PHYSICAL EXAMINATION:   Physical Exam  GENERAL:  63 y.o.-year-old patient lying in bed lethargic/encephalopathic but follows some commands.  EYES: Pupils equal, round, reactive to light and accommodation. No scleral icterus. Extraocular muscles intact.  HEENT: Head atraumatic, normocephalic. Dry Oral Mucosa NECK:  Supple, no jugular venous distention. No thyroid enlargement, no tenderness.  LUNGS: Poor Resp. effort, no wheezing, rales, rhonchi.  No use of accessory muscles of respiration.  CARDIOVASCULAR: S1, S2 normal. No murmurs, rubs, or gallops.  ABDOMEN: Soft, nontender, nondistended. Bowel sounds present. No  organomegaly or mass.  EXTREMITIES: No cyanosis, clubbing or edema b/l.    NEUROLOGIC: Cranial nerves II through XII are intact. No focal Motor or sensory deficits b/l.  Globally weak.  PSYCHIATRIC: The patient is alert and oriented x 2.  SKIN: No obvious rash, lesion, or ulcer.    LABORATORY PANEL:   CBC Recent Labs  Lab 05/16/19 0407  WBC 9.5  HGB 14.1  HCT 40.8  PLT 294   ------------------------------------------------------------------------------------------------------------------  Chemistries  Recent Labs  Lab 05/11/19 1159  05/15/19 0610 05/16/19 0407  NA 135   < > 138 136  K 4.0   < > 3.8 4.4  CL 92*   < > 104 103  CO2 30   < > 22 18*  GLUCOSE 883*   < > 84 138*  BUN 19   < > 20 21  CREATININE 1.14   < > 0.74 0.91  CALCIUM 10.3   < > 8.4* 8.2*  MG  --    < > 2.3  --   AST 21  --   --   --   ALT 30  --   --   --   ALKPHOS 152*  --   --   --   BILITOT 0.7  --   --   --    < > = values in this interval not displayed.   ------------------------------------------------------------------------------------------------------------------  Cardiac Enzymes No results for input(s): TROPONINI in the last 168 hours. ------------------------------------------------------------------------------------------------------------------  RADIOLOGY:  Dg Skull 1-3 Views  Result Date: 05/14/2019 CLINICAL DATA:  Shunt malfunction study EXAM: SKULL - 1-3 VIEW COMPARISON:  None.  FINDINGS: There is no evidence of skull fracture or other focal bone lesions. Ventriculoperitoneal shunt catheter with the tubing intact. IMPRESSION: Ventriculoperitoneal shunt catheter with the tubing intact. Electronically Signed   By: Kathreen Devoid   On: 05/14/2019 17:00     ASSESSMENT AND PLAN:   63 year old male with past medical history of previous Hemorrhagic CVA, hypothyroidism, GERD, diabetes, COPD, anxiety/depression, history of alcohol abuse who presented to the hospital due to altered mental  status and noted to be in nonketotic hyperosmolar state.  1.  Altered mental status/encephalopathy- Metabolic and related to hyperglycemia on admission.  Patient does have a history of previous CVA but CT head admission was negative. -No acute infectious source, EEG was negative for seizures, MRI of the brain negative for acute pathology. -Seen by both neurology and neurosurgery and no further neurologic imaging needed.   -Mental status much improved over the past 24 to 48 hours.  Patient is following commands.  Seen by speech and started on a dysphagia 2 diet today.  2.  Nonketotic hyperglycemia- much improved and off Insulin gtt.  -Continue Lantus, sliding scale insulin for now.  Blood sugars much improved.  3.  History of hemorrhagic CVA- patient has a previous VP shunt and CT/MRI (-) for acute pathology.  -Neurology and neurosurgery following.  No acute surgical indication.  4.  Hyperlipidemia-continue atorvastatin.  5.  Essential hypertension-continue Cardizem  6.  Chronic pain-continue Nucynta, MS Contin.  7.  Hypothyroidism-continue Armour Thyroid.  8.  Depression-continue Effexor.  9.  COPD-no acute exacerbation.  Continue maintenance prednisone, albuterol nebulizers as needed.  10.  Tachycardia/SVT- patient has had them intermittent episodes of this.  He is clinically asymptomatic. -Continue Cardizem.  Transfer to the floor, await physical therapy evaluation.  All the records are reviewed and case discussed with Care Management/Social Worker. Management plans discussed with the patient, family and they are in agreement.  CODE STATUS: DNR  DVT Prophylaxis: Lovenox  TOTAL TIME TAKING CARE OF THIS PATIENT: 30 minutes.   POSSIBLE D/C IN 2-3 DAYS, DEPENDING ON CLINICAL CONDITION.   Henreitta Leber M.D on 05/16/2019 at 3:38 PM  Between 7am to 6pm - Pager - [phone]  After 6pm go to [email or web address] - Proofreader  [facility]   Office  [phone]  CC: Primary care physician; [name], MD

## 2019-05-16 NOTE — Consult Note (Signed)
PHARMACY CONSULT NOTE - FOLLOW UP  Pharmacy Consult for Electrolyte Monitoring and Replacement   Recent Labs: Potassium (mmol/L)  Date Value  05/16/2019 4.4  05/11/2014 4.5   Magnesium (mg/dL)  Date Value  05/15/2019 2.3   Calcium (mg/dL)  Date Value  05/16/2019 8.2 (L)   Calcium, Total (mg/dL)  Date Value  05/11/2014 8.8   Albumin (g/dL)  Date Value  05/11/2019 3.7  01/24/2018 4.1  05/05/2014 2.7 (L)   Phosphorus (mg/dL)  Date Value  05/13/2019 4.1   Sodium (mmol/L)  Date Value  05/16/2019 136  01/24/2018 142  05/11/2014 134 (L)     Assessment: 63 y/o M admitted 9/19 with HHS and AMS. Patient history significant for hydrocephalus requiring VP shunt, COPD, T2DM, pulmonary fibrosis, hemorrhagic stroke, hypothyroidism.   9/21 EEG revealing for diffuse encephalopathy. 9/22 MRI showed no acute abnormality. Patient with multiple runs of SVT this admission requiring adenosine. He is currently on rate control with PO diltiazem.   Goal of Therapy:  K ~4 , Mg ~2 , Phos 2.5 - 4.5  Plan:  --No electrolyte supplementation warranted today --BMP tomorrow AM  [place] Resident 05/16/2019 2:16 PM

## 2019-05-16 NOTE — Progress Notes (Signed)
Follow up - Critical Care Medicine Note  Patient Details:    Kirk Robinson is an 63 y.o. 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  Admitted to stepdown for insulin infusion administration.  Lines, Airways, Drains: External Urinary Catheter (Active)  Collection Container Standard drainage bag 05/12/19 1400  Securement Method Securing device (Describe) 05/12/19 1400  Output (mL) 400 mL 05/12/19 1400    Anti-infectives:  Anti-infectives (From admission, onward)   None      Microbiology: Results for orders placed or performed during the hospital encounter of 05/11/19  SARS CORONAVIRUS 2 (TAT 6-24 HRS) Nasopharyngeal Nasopharyngeal Swab     Status: None   Collection Time: 05/11/19 12:56 PM   Specimen: Nasopharyngeal Swab  Result Value Ref Range Status   SARS Coronavirus 2 NEGATIVE NEGATIVE Final    Comment: (NOTE) SARS-CoV-2 target nucleic acids are NOT DETECTED. The SARS-CoV-2 RNA is generally detectable in upper and lower respiratory specimens during the acute phase of infection. Negative results do not preclude SARS-CoV-2 infection, do not rule out co-infections with other pathogens, and should not be used as the sole basis for treatment or other patient management decisions. Negative results must be combined with clinical observations, patient history, and epidemiological information. The expected result is Negative. Fact Sheet for Patients: SugarRoll.be Fact Sheet for Healthcare Providers: https://www.woods-mathews.com/ This test is not yet approved or cleared by the [place] FDA and  has been authorized for detection and/or diagnosis of SARS-CoV-2 by FDA under an Emergency Use Authorization (EUA). This EUA will remain  in effect (meaning this test can be used) for  the duration of the COVID-19 declaration under Section 56 4(b)(1) of the Act, 21 U.S.C. section 360bbb-3(b)(1), unless the authorization is terminated or revoked sooner. Performed at [facility], [place] [name], [place], [place] [address]   MRSA PCR Screening     Status: None   Collection Time: 05/11/19  4:55 PM   Specimen: Nasopharyngeal  Result Value Ref Range Status   MRSA by PCR NEGATIVE NEGATIVE Final    Comment:        The GeneXpert MRSA Assay (FDA approved for NASAL specimens only), is one component of a comprehensive MRSA colonization surveillance program. It is not intended to diagnose MRSA infection nor to guide or monitor treatment for MRSA infections. Performed at [facility], [address] [place], [place] [address]     Best Practice/Protocols:  VTE Prophylaxis: Lovenox (prophylaxtic dose) Hyperglycemia/ICU  Events: 09/24-patient clinically improved , plan for downgrade to med floor hopefully tiday  Studies: Dg Skull 1-3 Views  Result Date: 05/14/2019 CLINICAL DATA:  Shunt malfunction study EXAM: SKULL - 1-3 VIEW COMPARISON:  None. FINDINGS: There is no evidence of skull fracture or other focal bone lesions. Ventriculoperitoneal shunt catheter with the tubing intact. IMPRESSION: Ventriculoperitoneal shunt catheter with the tubing intact. Electronically Signed   By: Kathreen Devoid   On: 05/14/2019 17:00   Dg Skull 1-3 Views  Result Date: 05/13/2019 CLINICAL DATA:  VP shunt EXAM: SKULL - 1-3 VIEW COMPARISON:  08/12/2014 FINDINGS: Right frontal VP shunt tubing unchanged in position. No areas of disconnection or discontinuity. Normal calvarium.  Paranasal sinuses clear IMPRESSION: Right frontal VP shunt unchanged from prior studies. Electronically Signed   By: Franchot Gallo M.D.   On: 05/13/2019 19:12   Dg Chest 2 View  Result Date:  05/11/2019 CLINICAL DATA:  Shortness of breath EXAM: CHEST - 2 VIEW COMPARISON:  02/28/2019 FINDINGS: The heart  size and mediastinal contours are within normal limits. Chronic coarsened interstitial markings with basilar predominant reticular opacities, left greater than right. No pleural effusion. No pneumothorax. Partially visualized shunt catheter descends the anterior left chest wall. IMPRESSION: Chronic findings of interstitial lung disease, grossly stable compared to prior chest CT. A superimposed infectious process would be difficult to exclude. Electronically Signed   By: Davina Poke M.D.   On: 05/11/2019 13:28   Ct Head Wo Contrast  Result Date: 05/11/2019 CLINICAL DATA:  Wife states has had some vagueness and confusion x 1 week. States is weaker than usual. Patient on 3l 02 due to history of pulmonary fibrosis. Patient can states first and last name however is quite lethargic sitting in wheelchair. TKV EXAM: CT HEAD WITHOUT CONTRAST CT CERVICAL SPINE WITHOUT CONTRAST TECHNIQUE: Multidetector CT imaging of the head and cervical spine was performed following the standard protocol without intravenous contrast. Multiplanar CT image reconstructions of the cervical spine were also generated. COMPARISON:  03/13/2015, 09/21/2009 FINDINGS: CT HEAD FINDINGS Brain: Left frontal ventriculostomy catheter extends with its tip just posterior to the right caudate nucleus as before. No hydrocephalus. Patchy areas of white matter attenuation most marked in the frontal lobes as before. Negative for acute intracranial hemorrhage, midline shift, focal parenchymal edema, mass, or mass effect. Acute infarct may be inapparent on noncontrast CT. Vascular: No hyperdense vessel or unexpected calcification. Skull: Left ventriculostomy defect as before. Negative for fracture or focal lesion. Sinuses/Orbits: No acute finding. Other: None CT CERVICAL SPINE FINDINGS Alignment: Several mm anterolisthesis C4-5 probably related to asymmetric facet DJD left worse than right. 1-2 mm anterolisthesis C5-6 with solid-appearing fusion across  degenerated left facet. Skull base and vertebrae: Negative for fracture. No focal bone lesion. Asymmetric facet DJD left greater than right C2-C6. Soft tissues and spinal canal: No evident hematoma. No prevertebral soft tissue swelling. Bilateral carotid arterial calcifications. No acute findings. Disc levels: Mild narrowing of C5-6, moderate narrowing C6-7 and C7-T1 with anterior and posterior endplate spurring. Upper chest: Subpleural blebs in both lung apices. No acute findings. Other: None IMPRESSION: 1. Negative for bleed or other acute intracranial process. 2. Negative for cervical fracture or other acute bone abnormality. 3. Stable ventriculostomy catheter without hydrocephalus. 4. Multilevel cervical spondylitic changes as above. Electronically Signed   By: Lucrezia Europe M.D.   On: 05/11/2019 13:45   Ct Cervical Spine Wo Contrast  Result Date: 05/11/2019 CLINICAL DATA:  Wife states has had some vagueness and confusion x 1 week. States is weaker than usual. Patient on 3l 02 due to history of pulmonary fibrosis. Patient can states first and last name however is quite lethargic sitting in wheelchair. TKV EXAM: CT HEAD WITHOUT CONTRAST CT CERVICAL SPINE WITHOUT CONTRAST TECHNIQUE: Multidetector CT imaging of the head and cervical spine was performed following the standard protocol without intravenous contrast. Multiplanar CT image reconstructions of the cervical spine were also generated. COMPARISON:  03/13/2015, 09/21/2009 FINDINGS: CT HEAD FINDINGS Brain: Left frontal ventriculostomy catheter extends with its tip just posterior to the right caudate nucleus as before. No hydrocephalus. Patchy areas of white matter attenuation most marked in the frontal lobes as before. Negative for acute intracranial hemorrhage, midline shift, focal parenchymal edema, mass, or mass effect. Acute infarct may be inapparent on noncontrast CT. Vascular: No hyperdense vessel or unexpected calcification. Skull: Left ventriculostomy  defect as before. Negative for fracture or focal  lesion. Sinuses/Orbits: No acute finding. Other: None CT CERVICAL SPINE FINDINGS Alignment: Several mm anterolisthesis C4-5 probably related to asymmetric facet DJD left worse than right. 1-2 mm anterolisthesis C5-6 with solid-appearing fusion across degenerated left facet. Skull base and vertebrae: Negative for fracture. No focal bone lesion. Asymmetric facet DJD left greater than right C2-C6. Soft tissues and spinal canal: No evident hematoma. No prevertebral soft tissue swelling. Bilateral carotid arterial calcifications. No acute findings. Disc levels: Mild narrowing of C5-6, moderate narrowing C6-7 and C7-T1 with anterior and posterior endplate spurring. Upper chest: Subpleural blebs in both lung apices. No acute findings. Other: None IMPRESSION: 1. Negative for bleed or other acute intracranial process. 2. Negative for cervical fracture or other acute bone abnormality. 3. Stable ventriculostomy catheter without hydrocephalus. 4. Multilevel cervical spondylitic changes as above. Electronically Signed   By: Lucrezia Europe M.D.   On: 05/11/2019 13:45   Mr Jeri Cos [id number] Contrast  Result Date: 05/14/2019 CLINICAL DATA:  Encephalopathy. Additional history provided: Admitted with hyperglycemic hyperosmolar state which was treated, but patient remains encephalopathic. History of COPD, prior hemorrhagic stroke, VP shunt. EXAM: MRI HEAD WITHOUT AND WITH CONTRAST TECHNIQUE: Multiplanar, multiecho pulse sequences of the brain and surrounding structures were obtained without and with intravenous contrast. CONTRAST:  83mL GADAVIST GADOBUTROL 1 MMOL/ML IV SOLN COMPARISON:  Noncontrast head CT 05/11/2019,, brain MRI 01/12/2012 (report unavailable), brain MRI 04/09/2004 FINDINGS: Brain: Multiple sequences are motion degraded, including postcontrast imaging. Susceptibility artifact arising from external components of a left-sided shunt system obscures portions of the anterior left  cerebral hemisphere on multiple sequences, particularly on the DWI and gradient sequences. Within this limitation, no convincing evidence of acute infarction. Unchanged position of a left frontal approach ventricular catheter. No ventriculomegaly. No evidence of intracranial mass. No midline shift or extra-axial fluid collection. Redemonstrated gliosis along site of left frontal approach ventricular catheter and prior right frontal approach ventricular catheter. Additional mild scattered T2/FLAIR hyperintensity within the cerebral white matter is nonspecific, but consistent with chronic small vessel ischemic disease. Chronic blood products along the medial thalami and posterior third ventricle at site of remote hemorrhage. Cerebral volume is normal for age. No abnormal intracranial enhancement identified on motion degraded postcontrast imaging. Vascular: Flow voids maintained within the proximal large arterial vessels. Skull and upper cervical spine: Normal marrow signal. Sinuses/Orbits: Visualized orbits demonstrate no acute abnormality. Trace mucosal thickening within posterior left ethmoid air cells. No significant mastoid effusion. IMPRESSION: 1. Motion degraded examination. Susceptibility artifact from the left-sided shunt obscures portions of the anterior left cerebral hemisphere. 2. Within described limitations, no evidence of acute intracranial abnormality, including acute infarct. 3. Stable left frontal approach ventricular catheter. No ventriculomegaly. 4. Mild chronic small vessel ischemic disease. 5. Chronic hemosiderin along the medial thalami and posterior third ventricle at site of remote hemorrhage. Electronically Signed   By: Kellie Simmering   On: 05/14/2019 14:41        Subjective:    Overnight Issues: No issues overnight. Patient is more interactive today.  He has some tremor and is mildy tachycardic but is responsive.    He was seen by neurologist today, plan is for poss MRI brain and EEG.    Objective:  Vital signs for last 24 hours: Temp:  [97.4 F (36.3 C)-98.3 F (36.8 C)] 97.4 F (36.3 C) (09/24 0800) Pulse Rate:  [95-125] 102 (09/24 1000) Resp:  [10-28] 17 (09/24 1000) BP: (80-118)/(64-99) 81/68 (09/24 1000) SpO2:  [93 %-100 %] 99 % (09/24 1000)  Hemodynamic  parameters for last 24 hours:    Intake/Output from previous day: 09/23 0701 - 09/24 0700 In: 242.4 [I.V.:3.8; IV Piggyback:238.5] Out: -   Intake/Output this shift: No intake/output data recorded.  Vent settings for last 24 hours:    Physical Exam:  Physical Exam  Constitutional: well-developed and well-nourished.Lethargic.  HENT: Normocephalic and atraumatic.  Mouth/Throat: Oropharynx is clear and moist.  Eyes: Conjunctivae are normal. No scleral icterus. Pupils are unequal.  Strabismus on right, slight disconjugate gaze, chronic.  Neck: Supple. No JVD. No tracheal deviation. No thyromegaly.  Cardiovascular: Normal rate, regular rhythm, normal heart sounds and intact distal pulses.  No murmur heard. Respiratory: Effort normal. No tachypnea. No respiratory distress. Few wheezes. He has mild rhonchi.  Coarse breath sounds  GI: Soft. Bowel sounds are normal. No distension.  Musculoskeletal: Normal range of motion.     Neurological: He appears NAD No tremor.  No overt seizure activity.  Nursing does has noted occasional rhythmic tremor left upper extremity. Skin: Skin is warm, dry and intact.    Assessment/Plan:     1.  HHS S/p IVF now 4.4L + Diabetes coordinator - follow phase 1-3 HHS protocol - blood sugars have been 150-200    2. AMS with confusion  -s/p neuro eval - poss mRI/EEG -no seizures per neuro evaluation as patient is redirectable during episodes.  -improved - optimizing for downgrade  3. NSIP - plan for [place] with primary pulmonologist Dr Edwena Felty   4.  Hypothyroidism:  Continue with home regimen   5.  Chronic pain syndrome  -decreasing benzos and narcotics due  to AMS    LOS: 5 days   Additional comments: Updated wife at bedside.  Patient is DNR, status confirmed.    Critical care provider statement:    Critical care time (minutes):  33   Critical care time was exclusive of:  Separately billable procedures and  treating other patients   Critical care was necessary to treat or prevent imminent or  life-threatening deterioration of the following conditions:  Altered mentals status, Interstitial lung disease, acute on chronic hypoxemia, multiple comorbid conditions.    Critical care was time spent personally by me on the following  activities:  Development of treatment plan with patient or surrogate,  discussions with consultants, evaluation of patient's response to  treatment, examination of patient, obtaining history from patient or  surrogate, ordering and performing treatments and interventions, ordering  and review of laboratory studies and re-evaluation of patient's condition   I assumed direction of critical care for this patient from another  provider in my specialty: no       Ottie Glazier, M.D.  [facility] [place]

## 2019-05-16 NOTE — Progress Notes (Signed)
PT Cancellation Note  Patient Details Name: Kirk Robinson MRN: [id number] DOB: Mar 26, 1956   Cancelled Treatment:    Reason Eval/Treat Not Completed: Other (comment)(Patient able to open eyes, squeeze hands, oriented to location, verbalizes 1-2 words at a time. Pt unwiling to move in bed/participate in meaningful PT eval, PT will follow up as able.)   Lieutenant Diego PT, DPT 10:39 AM,05/16/19 [phone]

## 2019-05-16 NOTE — Progress Notes (Signed)
Subjective: Patient further improved today.  Not yet back to baseline.     Objective: Current vital signs: BP (!) 81/68   Pulse (!) 102   Temp (!) 97.4 F (36.3 C) (Oral)   Resp 17   Ht 5\' 9"  (1.753 m)   Wt 65.6 kg   SpO2 99%   BMI 21.36 kg/m  Vital signs in last 24 hours: Temp:  [97.4 F (36.3 C)-98.3 F (36.8 C)] 97.4 F (36.3 C) (09/24 0800) Pulse Rate:  [95-125] 102 (09/24 1000) Resp:  [10-28] 17 (09/24 1000) BP: (80-118)/(64-99) 81/68 (09/24 1000) SpO2:  [93 %-100 %] 99 % (09/24 1000)  Intake/Output from previous day: 09/23 0701 - 09/24 0700 In: 242.4 [I.V.:3.8; IV Piggyback:238.5] Out: -  Intake/Output this shift: No intake/output data recorded. Nutritional status:  Diet Order            Diet NPO time specified Except for: Sips with Meds  Diet effective now              Neurologic Exam: Mental Status: Alert.  Able to tell me his first name.  Knows he is in the hospital.  Follows some simple commands.  Questionable left neglect.   Cranial Nerves: [id number] to bilateral confrontation. . III,IV, [id number] oculocephalic maneuver responses V,VII:Intact corneal bilaterally VIII: hearing normal bilaterally IX,X: gag reflexunable to be tested XI: bilateral shoulder shrugunable to be tested [id number] tongue extension Motor: Moves all extremities against gravity with no focal weakness appreciated  Lab Results: Basic Metabolic Panel: Recent Labs  Lab 05/12/19 0029 05/12/19 0400 05/12/19 2208 05/13/19 0622 05/14/19 0438 05/14/19 0444 05/15/19 0610 05/16/19 0407  NA 142 142 138 133*  --  137 138 136  K 3.9 3.4* 3.6 3.8  --  3.5 3.8 4.4  CL 104 103 101 98  --  102 104 103  CO2 31 31 27 24   --  24 22 18*  GLUCOSE 156* 179* 174* 148*  --  120* 84 138*  BUN 16 15 8 8   --  14 20 21   CREATININE 0.63 0.58* 0.85 0.65  --  0.91 0.74 0.91  CALCIUM 8.6* 8.6* 8.7* 8.9  --  8.6* 8.4* 8.2*  MG 1.9 1.9 1.8 1.7 1.9  --  2.3  --   PHOS 2.9 2.1* 2.1*  4.1  --   --   --   --     Liver Function Tests: Recent Labs  Lab 05/11/19 1159  AST 21  ALT 30  ALKPHOS 152*  BILITOT 0.7  PROT 6.8  ALBUMIN 3.7   No results for input(s): LIPASE, AMYLASE in the last 168 hours. Recent Labs  Lab 05/13/19 1249  AMMONIA 20    CBC: Recent Labs  Lab 05/11/19 1159 05/12/19 0400 05/13/19 0622 05/15/19 0610 05/16/19 0407  WBC 8.1 8.1 9.9 10.7* 9.5  NEUTROABS 6.6  --   --   --  8.2*  HGB 13.4 12.2* 14.6 14.9 14.1  HCT 38.6* 35.0* 41.0 43.2 40.8  MCV 88.1 87.5 86.3 87.6 88.3  PLT 234 193 267 265 294    Cardiac Enzymes: No results for input(s): CKTOTAL, CKMB, CKMBINDEX, TROPONINI in the last 168 hours.  Lipid Panel: No results for input(s): CHOL, TRIG, HDL, CHOLHDL, VLDL, LDLCALC in the last 168 hours.  CBG: Recent Labs  Lab 05/15/19 1620 05/15/19 1940 05/16/19 0104 05/16/19 0444 05/16/19 0756  GLUCAP 90 85 99 135* 126*    Microbiology: Results for orders placed or performed during the hospital encounter  of 05/11/19  SARS CORONAVIRUS 2 (TAT 6-24 HRS) Nasopharyngeal Nasopharyngeal Swab     Status: None   Collection Time: 05/11/19 12:56 PM   Specimen: Nasopharyngeal Swab  Result Value Ref Range Status   SARS Coronavirus 2 NEGATIVE NEGATIVE Final    Comment: (NOTE) SARS-CoV-2 target nucleic acids are NOT DETECTED. The SARS-CoV-2 RNA is generally detectable in upper and lower respiratory specimens during the acute phase of infection. Negative results do not preclude SARS-CoV-2 infection, do not rule out co-infections with other pathogens, and should not be used as the sole basis for treatment or other patient management decisions. Negative results must be combined with clinical observations, patient history, and epidemiological information. The expected result is Negative. Fact Sheet for Patients: SugarRoll.be Fact Sheet for Healthcare Providers: https://www.woods-mathews.com/ This  test is not yet approved or cleared by the [place] FDA and  has been authorized for detection and/or diagnosis of SARS-CoV-2 by FDA under an Emergency Use Authorization (EUA). This EUA will remain  in effect (meaning this test can be used) for the duration of the COVID-19 declaration under Section 56 4(b)(1) of the Act, 21 U.S.C. section 360bbb-3(b)(1), unless the authorization is terminated or revoked sooner. Performed at [facility], [place] [address], [place], [place] [address]   MRSA PCR Screening     Status: None   Collection Time: 05/11/19  4:55 PM   Specimen: Nasopharyngeal  Result Value Ref Range Status   MRSA by PCR NEGATIVE NEGATIVE Final    Comment:        The GeneXpert MRSA Assay (FDA approved for NASAL specimens only), is one component of a comprehensive MRSA colonization surveillance program. It is not intended to diagnose MRSA infection nor to guide or monitor treatment for MRSA infections. Performed at [facility], [address] [place],  [address]     Coagulation Studies: No results for input(s): LABPROT, INR in the last 72 hours.  Imaging: Dg Skull 1-3 Views  Result Date: 05/14/2019 CLINICAL DATA:  Shunt malfunction study EXAM: SKULL - 1-3 VIEW COMPARISON:  None. FINDINGS: There is no evidence of skull fracture or other focal bone lesions. Ventriculoperitoneal shunt catheter with the tubing intact. IMPRESSION: Ventriculoperitoneal shunt catheter with the tubing intact. Electronically Signed   By: Kathreen Devoid   On: 05/14/2019 17:00   Mr Brain W Wo Contrast  Result Date: 05/14/2019 CLINICAL DATA:  Encephalopathy. Additional history provided: Admitted with hyperglycemic hyperosmolar state which was treated, but patient remains encephalopathic. History of COPD, prior hemorrhagic stroke, VP shunt. EXAM: MRI HEAD WITHOUT AND WITH CONTRAST TECHNIQUE: Multiplanar, multiecho pulse sequences of the brain and surrounding structures were  obtained without and with intravenous contrast. CONTRAST:  77mL GADAVIST GADOBUTROL 1 MMOL/ML IV SOLN COMPARISON:  Noncontrast head CT 05/11/2019,, brain MRI 01/12/2012 (report unavailable), brain MRI 04/09/2004 FINDINGS: Brain: Multiple sequences are motion degraded, including postcontrast imaging. Susceptibility artifact arising from external components of a left-sided shunt system obscures portions of the anterior left cerebral hemisphere on multiple sequences, particularly on the DWI and gradient sequences. Within this limitation, no convincing evidence of acute infarction. Unchanged position of a left frontal approach ventricular catheter. No ventriculomegaly. No evidence of intracranial mass. No midline shift or extra-axial fluid collection. Redemonstrated gliosis along site of left frontal approach ventricular catheter and prior right frontal approach ventricular catheter. Additional mild scattered T2/FLAIR hyperintensity within the cerebral white matter is nonspecific, but consistent with chronic small vessel ischemic disease. Chronic blood products along the medial thalami  and posterior third ventricle at site of remote hemorrhage. Cerebral volume is normal for age. No abnormal intracranial enhancement identified on motion degraded postcontrast imaging. Vascular: Flow voids maintained within the proximal large arterial vessels. Skull and upper cervical spine: Normal marrow signal. Sinuses/Orbits: Visualized orbits demonstrate no acute abnormality. Trace mucosal thickening within posterior left ethmoid air cells. No significant mastoid effusion. IMPRESSION: 1. Motion degraded examination. Susceptibility artifact from the left-sided shunt obscures portions of the anterior left cerebral hemisphere. 2. Within described limitations, no evidence of acute intracranial abnormality, including acute infarct. 3. Stable left frontal approach ventricular catheter. No ventriculomegaly. 4. Mild chronic small vessel  ischemic disease. 5. Chronic hemosiderin along the medial thalami and posterior third ventricle at site of remote hemorrhage. Electronically Signed   By: Kellie Simmering   On: 05/14/2019 14:41    Medications:  I have reviewed the patient's current medications. Scheduled: . atorvastatin  20 mg Oral q1800  . Chlorhexidine Gluconate Cloth  6 each Topical QHS  . diazepam  5 mg Oral BID  . diltiazem  120 mg Oral Daily  . enoxaparin (LOVENOX) injection  40 mg Subcutaneous Q24H  . insulin aspart  0-9 Units Subcutaneous Q4H  . insulin glargine  5 Units Subcutaneous QHS  . morphine  30 mg Oral Q8H  . pantoprazole  40 mg Oral Daily  . predniSONE  20 mg Oral Q breakfast  . sodium chloride flush  3 mL Intravenous Once  . thiamine  100 mg Oral Daily  . thyroid  90 mg Oral QAC breakfast  . venlafaxine XR  150 mg Oral Q breakfast    Assessment/Plan: Patient continues to slowly improve.  With patient continuing to improve no further neurological testing recommended at this time.  Will continue to follow with you.     Case discussed with Dr. Lanney Gins   LOS: 5 days   Alexis Goodell, MD Neurology [phone] 05/16/2019  11:04 AM

## 2019-05-16 NOTE — Evaluation (Signed)
Clinical/Bedside Swallow Evaluation Patient Details  Name: Kirk Robinson MRN: [id number] Date of Birth: [birth year]  Today's Date: 05/16/2019 Time: SLP Start Time (ACUTE ONLY): 1421 SLP Stop Time (ACUTE ONLY): 1501 SLP Time Calculation (min) (ACUTE ONLY): 40 min  Past Medical History:  Past Medical History:  Diagnosis Date  . Alcohol use 03/16/2007   patient risk factors  . Allergic rhinitis, cause unspecified   . Altered mental status   . Anxiety state, unspecified   . Cervicalgia 09/17/2007  . COPD (chronic obstructive pulmonary disease) ([place])   . Diabetes mellitus without complication ([place])   . Diplopia 12/21/2007  . Esophageal reflux   . Gastritis 05/13/2009  . HCAP (healthcare-associated pneumonia) 08/12/2014  . Hematuria 03/16/2007  . Hyperthyroidism    graves disease  . Iodine hypothyroidism   . Memory loss   . NSIP (nonspecific interstitial pneumonia) ([place])    on azathioprine  . Osteoporosis, unspecified    bone density per [facility] in 2012  . Other abnormal glucose 04/08/2008  . Other diseases of lung, not elsewhere classified 01/23/2009   s/p [place]/pulmonology consult; intolerant  to Advair  . Peripheral vascular disease, unspecified ([place]) 12/24/2009  . Personal history of colonic polyps   . Pneumonia, organism unspecified(486)   . Pure hypercholesterolemia   . Shortness of breath   . Stroke ([place]) 2005  . Tobacco use disorder 03/16/2007   patient risk factors  . Unspecified hypothyroidism 01/07/2010  . 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   Past Surgical History:  Past Surgical History:  Procedure Laterality Date  . Admission 11/2011     altered mental status/confusion with syncope.  CT head negative, MRI brain negative, EEG: diffuse slowing but no  seizure acitivity.  Labs negative.  [facility].  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 EYE SURGERY  2010  . LAPAROSCOPIC REVISION VENTRICULAR-PERITONEAL (V-P) SHUNT Right  05/20/2014   Procedure: LAPAROSCOPIC REVISION VENTRICULAR-PERITONEAL (V-P) SHUNT;  Surgeon: Georganna Skeans, MD;  Location: [facility];  Service: General;  Laterality: Right;  . Neuropsychiatric evaluation  04/2012   poor short-term memory, poor recall, delayed reaction time; permanently disabled.     HPI:  H&P 05/11/2019: "Kirk Robinson  is a 63 y.o. male with a known history as below.  The patient is confused, cannot provide any information.  According to his wife, the patient has a history of chronic respiratory failure on home oxygen 3 L, COPD, pulmonary fibrosis, hypertension, pneumonia, CVA, chronic pain syndrome and hydrocephalus etc.  The patient's condition has been declining for the past 3 months.  He lost weight for about 10 to 15 pounds for the past 2 months.  He was dehydrated and treated with IV fluid recently.  He has been confused for the past 1 week.  He is found high blood sugar 883 in ED.  AG is unremarkable.  Insulin is started.  ED PA request admission."   Assessment / Plan / Recommendation Clinical Impression  The patient presents with no clinical indicators of aspiration across consistencies.  He is able to drink water via cup rim and straw with no cough, throat clear, vocal quality change, or other clinical indicators of aspiration.  He is able to masticate and swallow graham cracker in applesauce with no clinical indicators of aspiration or oral residue.  The patient reported that while he could chew it, he would prefer softer foods for now.  Recommend dysphagia 2 diet with thin liquids. SLP will follow for  diet toleration and diet upgrade as appropriate. SLP Visit Diagnosis: Dysphagia, oral phase (R13.11)    Aspiration Risk  No limitations    Diet Recommendation Dysphagia 2 (Fine chop);Thin liquid   Liquid Administration via: Cup;Straw Medication Administration: Whole meds with liquid Supervision: Staff to assist with self feeding Compensations: Minimize environmental  distractions;Slow rate;Small sips/bites Postural Changes: Seated upright at 90 degrees;Remain upright for at least 30 minutes after po intake    Other  Recommendations Oral Care Recommendations: Oral care BID;Staff/trained caregiver to provide oral care   Follow up Recommendations Other (comment)(TBD)      Frequency and Duration min 2x/week  2 weeks       Prognosis Prognosis for Safe Diet Advancement: Good      Swallow Study   General Date of Onset: 05/11/19 HPI: H&P 05/11/2019: "Kirk Robinson  is a 63 y.o. male with a known history as below.  The patient is confused, cannot provide any information.  According to his wife, the patient has a history of chronic respiratory failure on home oxygen 3 L, COPD, pulmonary fibrosis, hypertension, pneumonia, CVA, chronic pain syndrome and hydrocephalus etc.  The patient's condition has been declining for the past 3 months.  He lost weight for about 10 to 15 pounds for the past 2 months.  He was dehydrated and treated with IV fluid recently.  He has been confused for the past 1 week.  He is found high blood sugar 883 in ED.  AG is unremarkable.  Insulin is started.  ED PA request admission." Type of Study: Bedside Swallow Evaluation Previous Swallow Assessment: None known Diet Prior to this Study: NPO Oral Cavity Assessment: Within Functional Limits Oral Cavity - Dentition: Missing dentition Self-Feeding Abilities: Needs assist Patient Positioning: Upright in bed Baseline Vocal Quality: Normal    Oral/Motor/Sensory Function Overall Oral Motor/Sensory Function: Within functional limits   Ice Chips Ice chips: Within functional limits Presentation: Spoon   Thin Liquid Thin Liquid: Within functional limits Presentation: Cup;Self Fed;Straw    Nectar Thick     Honey Thick     Puree Puree: Within functional limits Presentation: Spoon   Solid     Solid: Within functional limits Presentation: Spoon Other Comments: Assessed with cracker in  moist substrate      Leroy Sea, MS/CCC- SLP  Valetta Fuller, Daine Floras 05/16/2019,3:02 PM

## 2019-05-17 LAB — BASIC METABOLIC PANEL
Anion gap: 15 (ref 5–15)
BUN: 18 mg/dL (ref 8–23)
CO2: 22 mmol/L (ref 22–32)
Calcium: 8.7 mg/dL — ABNORMAL LOW (ref 8.9–10.3)
Chloride: 101 mmol/L (ref 98–111)
Creatinine, Ser: 0.78 mg/dL (ref 0.61–1.24)
GFR calc Af Amer: >60 mL/min (ref >60)
GFR calc non Af Amer: >60 mL/min (ref >60)
Glucose, Bld: 164 mg/dL — ABNORMAL HIGH (ref 70–99)
Potassium: 3.7 mmol/L (ref 3.5–5.1)
Sodium: 138 mmol/L (ref 135–145)

## 2019-05-17 LAB — GLUCOSE, CAPILLARY
Glucose-Capillary: 129 mg/dL — ABNORMAL HIGH (ref 70–99)
Glucose-Capillary: 138 mg/dL — ABNORMAL HIGH (ref 70–99)
Glucose-Capillary: 145 mg/dL — ABNORMAL HIGH (ref 70–99)
Glucose-Capillary: 157 mg/dL — ABNORMAL HIGH (ref 70–99)
Glucose-Capillary: 218 mg/dL — ABNORMAL HIGH (ref 70–99)
Glucose-Capillary: 361 mg/dL — ABNORMAL HIGH (ref 70–99)

## 2019-05-17 MED ORDER — POTASSIUM CHLORIDE CRYS ER 20 MEQ PO TBCR
20.0000 meq | EXTENDED_RELEASE_TABLET | Freq: Once | ORAL | Status: AC
  Administered 2019-05-17: 11:00:00 20 meq via ORAL

## 2019-05-17 MED ORDER — ADULT MULTIVITAMIN W/MINERALS CH
1.0000 | ORAL_TABLET | Freq: Every day | ORAL | Status: DC
  Administered 2019-05-18 – 2019-05-21 (×4): 1 via ORAL
  Filled 2019-05-17 (×4): qty 1

## 2019-05-17 MED ORDER — ENSURE MAX PROTEIN PO LIQD
11.0000 [oz_av] | Freq: Two times a day (BID) | ORAL | Status: DC
  Administered 2019-05-17 – 2019-05-21 (×7): 11 [oz_av] via ORAL
  Filled 2019-05-17: qty 330

## 2019-05-17 NOTE — Consult Note (Signed)
PHARMACY CONSULT NOTE - FOLLOW UP  Pharmacy Consult for Electrolyte Monitoring and Replacement   Recent Labs: Potassium (mmol/L)  Date Value  05/17/2019 3.7  05/11/2014 4.5   Magnesium (mg/dL)  Date Value  05/15/2019 2.3   Calcium (mg/dL)  Date Value  05/17/2019 8.7 (L)   Calcium, Total (mg/dL)  Date Value  05/11/2014 8.8   Albumin (g/dL)  Date Value  05/11/2019 3.7  01/24/2018 4.1  05/05/2014 2.7 (L)   Phosphorus (mg/dL)  Date Value  05/13/2019 4.1   Sodium (mmol/L)  Date Value  05/17/2019 138  01/24/2018 142  05/11/2014 134 (L)     Assessment: 63 y/o M admitted 9/19 with HHS and AMS. Patient history significant for hydrocephalus requiring VP shunt, COPD, T2DM, pulmonary fibrosis, hemorrhagic stroke, hypothyroidism.   Pharmacy consulted for electrolyte monitoring and replacement.    Goal of Therapy:  K ~4 , Mg ~2 , Phos 2.5 - 4.5  Plan:  Will order KCL 76mEq po x 1 dose.   --BMP/Mg tomorrow AM. Pharmacy will continue to replace as needed.   Pernell Dupre, PharmD, BCPS Clinical Pharmacist 05/17/2019 10:09 AM

## 2019-05-17 NOTE — Progress Notes (Signed)
Patient was bladder scanned. Scan showed 361 ml of urine. Patient was I&O'd. 300 ml's of urine removed. Will continue to monitor.

## 2019-05-17 NOTE — Progress Notes (Signed)
Inpatient Diabetes Program Recommendations  AACE/ADA: New Consensus Statement on Inpatient Glycemic Control (2015)  Target Ranges:  Prepandial:   less than 140 mg/dL      Peak postprandial:   less than 180 mg/dL (1-2 hours)      Critically ill patients:  140 - 180 mg/dL   Results for Kirk Robinson, Kirk Robinson (MRN [id number]) as of 05/17/2019 08:22  Ref. Range 05/16/2019 01:04 05/16/2019 04:44 05/16/2019 07:56 05/16/2019 12:04 05/16/2019 15:47 05/16/2019 19:59  Glucose-Capillary Latest Ref Range: 70 - 99 mg/dL 99 135 (H)  1 unit NOVOLOG  126 (H)  1 unit NOVOLOG  102 (H) 216 (H)  3 units NOVOLOG  199 (H)  2 units NOVOLOG +  5 units LANTUS    Results for Kirk Robinson, Kirk Robinson (MRN [id number]) as of 05/17/2019 08:22  Ref. Range 01/24/2018 11:00 05/11/2019 11:59  Hemoglobin A1C Latest Ref Range: 4.8 - 5.6 % 6.5 (H) 13.2 (H)  (332 mg/dl)     Admit with: HHNK, AMS   History: DM, ETOH use, COPD, Pulmonary Fibrosis  Home DM Meds: Metformin 1000 mg BID  Current Orders: Lantus 5 units QHS      Novolog Sensitive Correction Scale/ SSI (0-9 units) Q4 hours       Getting Prednisone 20 mg Daily.  Takes Prednisone at home on Chronic basis.    MD- Note pt's Current A1c is significantly elevated to 13.2% but pt requiring very little insulin in hospital.  Note PO Dysphagic diet just started yesterday.  Note patient taking Prednisone at home that may have led to the Hyperglycemic crisis which brought pt to the hospital.  Not sure if patient will need insulin for home as he is not requiring much insulin at all in hospital.  May consider the following to determine home DM medication needs:  1. Stop Lantus for now  2. Change Novolog SSI to TID AC + HS  3. Restart home dose of Metformin if safe  4. Start a 2nd oral DM medication to see if it will help with glucose control for home without insulin for home----Recommend Amaryl 2 mg Daily to start      --Will follow patient during  hospitalization--  Wyn Quaker RN, MSN, CDE Diabetes Coordinator Inpatient Glycemic Control Team Team Pager: [phone] (8a-5p)

## 2019-05-17 NOTE — Progress Notes (Signed)
OT Cancellation Note  Patient Details Name: Kirk Robinson MRN: [id number] DOB: 08-31-1955   Cancelled Treatment:    Reason Eval/Treat Not Completed: Patient's level of consciousness;Other (comment) Thank you for the OT consult. Order received and chart reviewed. Spoke with Physical Therapist who attempted to see pt this date, pt unable to follow commands/participate in evaluation at this time. Will follow acutely and re-attempt at a later date/time as available and pt medically appropriate for OT evaluation.   Shara Blazing, M.S., OTR/L Ascom: [phone] 05/17/19, 2:10 PM

## 2019-05-17 NOTE — Progress Notes (Signed)
[place] [facility] [place] NAME: Jana Mastin    MR#:  [id number]  DATE OF BIRTH:  04-15-1956  SUBJECTIVE:   Pt. Continues to be confused a bit.  He is alert and follows some simple commands.   REVIEW OF SYSTEMS:    Review of Systems  Unable to perform ROS: Mental acuity    Nutrition: Dysphagia II with thin liquids  Tolerating Diet: Yes Tolerating PT: Await Eval.   DRUG ALLERGIES:   Allergies  Allergen Reactions  . Advair Diskus [Fluticasone-Salmeterol] Shortness Of Breath  . Erythromycin Nausea And Vomiting  . Iodinated Diagnostic Agents Shortness Of Breath  . Ativan [Lorazepam] Other (See Comments)    Caused pt's heart to stop  . Darvocet [Propoxyphene N-Acetaminophen] Nausea And Vomiting  . Doxycycline Hyclate Nausea And Vomiting, Swelling and Other (See Comments)    Tongue swelling, severe depression  . Propoxyphene Nausea And Vomiting  . Septra [Sulfamethoxazole-Trimethoprim] Rash    VITALS:  Blood pressure 103/80, pulse 80, temperature 98 F (36.7 C), resp. rate 20, height 5\' 9"  (1.753 m), weight 65.6 kg, SpO2 96 %.  PHYSICAL EXAMINATION:   Physical Exam  GENERAL:  63 y.o.-year-old patient lying in bed lethargic/encephalopathic but follows some commands.  EYES: Pupils equal, round, reactive to light and accommodation. No scleral icterus. Extraocular muscles intact.  HEENT: Head atraumatic, normocephalic. Dry Oral Mucosa NECK:  Supple, no jugular venous distention. No thyroid enlargement, no tenderness.  LUNGS: Poor Resp. effort, no wheezing, rales, rhonchi.  No use of accessory muscles of respiration.  CARDIOVASCULAR: S1, S2 normal. No murmurs, rubs, or gallops.  ABDOMEN: Soft, nontender, nondistended. Bowel sounds present. No organomegaly or mass.  EXTREMITIES: No cyanosis, clubbing or edema b/l.    NEUROLOGIC: Cranial nerves II through XII are intact. No focal Motor or sensory deficits b/l.  Globally weak.   PSYCHIATRIC: The patient is alert and oriented x 1.  SKIN: No obvious rash, lesion, or ulcer.    LABORATORY PANEL:   CBC Recent Labs  Lab 05/16/19 0407  WBC 9.5  HGB 14.1  HCT 40.8  PLT 294   ------------------------------------------------------------------------------------------------------------------  Chemistries  Recent Labs  Lab 05/11/19 1159  05/15/19 0610  05/17/19 0534  NA 135   < > 138   < > 138  K 4.0   < > 3.8   < > 3.7  CL 92*   < > 104   < > 101  CO2 30   < > 22   < > 22  GLUCOSE 883*   < > 84   < > 164*  BUN 19   < > 20   < > 18  CREATININE 1.14   < > 0.74   < > 0.78  CALCIUM 10.3   < > 8.4*   < > 8.7*  MG  --    < > 2.3  --   --   AST 21  --   --   --   --   ALT 30  --   --   --   --   ALKPHOS 152*  --   --   --   --   BILITOT 0.7  --   --   --   --    < > = values in this interval not displayed.   ------------------------------------------------------------------------------------------------------------------  Cardiac Enzymes No results for input(s): TROPONINI in the last 168 hours. ------------------------------------------------------------------------------------------------------------------  RADIOLOGY:  No results found.  ASSESSMENT AND PLAN:   63 year old male with past medical history of previous Hemorrhagic CVA, hypothyroidism, GERD, diabetes, COPD, anxiety/depression, history of alcohol abuse who presented to the hospital due to altered mental status and noted to be in nonketotic hyperosmolar state.  1.  Altered mental status/encephalopathy- Metabolic and related to hyperglycemia on admission.  Patient does have a history of previous CVA but CT head/MRI brain negative. -No acute infectious source, EEG was negative for seizures -Seen by both neurology and neurosurgery and no further neurologic imaging needed.  -Mental status is slow to improve.  Patient is following some simple commands and now placed on a dysphagia 2 diet and will  continue to monitor.  2.  Nonketotic hyperglycemia- much improved and off Insulin gtt.  -Continue Lantus, sliding scale insulin for now.  Blood sugars much improved.  3.  History of hemorrhagic CVA- patient has a previous VP shunt and presently CT/MRI (-) for acute pathology.  -Neurology and neurosurgery following.  4.  Hyperlipidemia-continue atorvastatin.  5.  Essential hypertension-continue Cardizem  6.  Chronic pain-continue Nucynta, MS Contin.  7.  Hypothyroidism-continue Armour Thyroid.  8.  Depression-continue Effexor.  9.  COPD-no acute exacerbation.  Continue maintenance prednisone, albuterol nebulizers as needed.  10.  Tachycardia/SVT- improved and cont. Cardizem.   Await PT/OT eval.    All the records are reviewed and case discussed with Care Management/Social Worker. Management plans discussed with the patient, family and they are in agreement.  CODE STATUS: DNR  DVT Prophylaxis: Lovenox  TOTAL TIME TAKING CARE OF THIS PATIENT: 30 minutes.   POSSIBLE D/C IN 2-3 DAYS, DEPENDING ON CLINICAL CONDITION.   Henreitta Leber M.D on 05/17/2019 at 1:03 PM  Between 7am to 6pm - Pager - [phone]  After 6pm go to [email or web address] - Proofreader  [facility]  Office  [phone]  CC: Primary care physician; [name], MD

## 2019-05-17 NOTE — Progress Notes (Addendum)
Pt lethargic, unable to lift hands to feed himself and slow to follow commands. 1400 MS Contin held due to symptoms. Will continue to monitor.

## 2019-05-17 NOTE — Progress Notes (Addendum)
PT Cancellation Note  Patient Details Name: Kirk Robinson MRN: [id number] DOB: 1956/02/17   Cancelled Treatment:    Reason Eval/Treat Not Completed: (Patient unable/unwilling to state name/place/location/wife's name. Attempted to sit patient EOB, total A to complete, unable to maintain positioning/participate with therapy. PT to re-attempt tomorrow.)   Lieutenant Diego PT, DPT 1:48 PM,05/17/19 [phone]

## 2019-05-18 LAB — BASIC METABOLIC PANEL
Anion gap: 10 (ref 5–15)
BUN: 25 mg/dL — ABNORMAL HIGH (ref 8–23)
CO2: 23 mmol/L (ref 22–32)
Calcium: 8.9 mg/dL (ref 8.9–10.3)
Chloride: 102 mmol/L (ref 98–111)
Creatinine, Ser: 1.12 mg/dL (ref 0.61–1.24)
GFR calc Af Amer: >60 mL/min (ref >60)
GFR calc non Af Amer: >60 mL/min (ref >60)
Glucose, Bld: 156 mg/dL — ABNORMAL HIGH (ref 70–99)
Potassium: 5 mmol/L (ref 3.5–5.1)
Sodium: 135 mmol/L (ref 135–145)

## 2019-05-18 LAB — GLUCOSE, CAPILLARY
Glucose-Capillary: 144 mg/dL — ABNORMAL HIGH (ref 70–99)
Glucose-Capillary: 149 mg/dL — ABNORMAL HIGH (ref 70–99)
Glucose-Capillary: 179 mg/dL — ABNORMAL HIGH (ref 70–99)
Glucose-Capillary: 188 mg/dL — ABNORMAL HIGH (ref 70–99)
Glucose-Capillary: 189 mg/dL — ABNORMAL HIGH (ref 70–99)
Glucose-Capillary: 189 mg/dL — ABNORMAL HIGH (ref 70–99)
Glucose-Capillary: 240 mg/dL — ABNORMAL HIGH (ref 70–99)

## 2019-05-18 LAB — MAGNESIUM: Magnesium: 2 mg/dL (ref 1.7–2.4)

## 2019-05-18 MED ORDER — POLYETHYLENE GLYCOL 3350 17 G PO PACK: 17.0000 g | PACK | Freq: Every day | ORAL | Status: DC

## 2019-05-18 MED ORDER — BISACODYL 10 MG RE SUPP: 10.0000 mg | Freq: Every day | RECTAL | Status: DC | PRN

## 2019-05-18 MED ORDER — MORPHINE SULFATE ER 15 MG PO TBCR
15.0000 mg | EXTENDED_RELEASE_TABLET | Freq: Three times a day (TID) | ORAL | Status: DC
  Administered 2019-05-20 – 2019-05-21 (×2): 15 mg via ORAL
  Filled 2019-05-18 (×3): qty 1

## 2019-05-18 NOTE — Consult Note (Signed)
PHARMACY CONSULT NOTE - FOLLOW UP  Pharmacy Consult for Electrolyte Monitoring and Replacement   Recent Labs: Potassium (mmol/L)  Date Value  05/18/2019 5.0  05/11/2014 4.5   Magnesium (mg/dL)  Date Value  05/18/2019 2.0   Calcium (mg/dL)  Date Value  05/18/2019 8.9   Calcium, Total (mg/dL)  Date Value  05/11/2014 8.8   Albumin (g/dL)  Date Value  05/11/2019 3.7  01/24/2018 4.1  05/05/2014 2.7 (L)   Phosphorus (mg/dL)  Date Value  05/13/2019 4.1   Sodium (mmol/L)  Date Value  05/18/2019 135  01/24/2018 142  05/11/2014 134 (L)     Assessment: 63 y/o M admitted 9/19 with HHS and AMS. Patient history significant for hydrocephalus requiring VP shunt, COPD, T2DM, pulmonary fibrosis, hemorrhagic stroke, hypothyroidism.   Pharmacy consulted for electrolyte monitoring and replacement.    Goal of Therapy:  K ~4 , Mg ~2   Plan:  KCL 5.0 Mag 2.0 Scr 1.12 No supplementation at this time.  --BMP tomorrow AM. Pharmacy will continue to replace as needed.   Noralee Space, PharmD, BCPS Clinical Pharmacist 05/18/2019 8:35 AM

## 2019-05-18 NOTE — Progress Notes (Addendum)
Patient ID: Kirk Robinson, male   DOB: [birth year], 63 y.o.   MRN: [id number]  [facility] PROGRESS NOTE  Kirk Robinson [id number] DOB: [birth year] DOA: 05/11/2019 PCP: Kirk Honour, MD  HPI/Subjective: Patient seen earlier while he was in the bathroom.  I told him I can come back.  Nursing staff did not get him out to the chair and he was lying in the chair with his legs up on the bed.  Patient answered a few questions.  I told him I was going to speak with his wife and he said she was in the kitchen.  Objective: Vitals:   05/17/19 1947 05/18/19 0409  BP: 98/76 101/72  Pulse: 93 98  Resp: 16 19  Temp: 97.9 F (36.6 C) 98.3 F (36.8 C)  SpO2: 96% 94%    Intake/Output Summary (Last 24 hours) at 05/18/2019 1121 Last data filed at 05/18/2019 0530 Gross per 24 hour  Intake 440 ml  Output 500 ml  Net -60 ml   Filed Weights   05/11/19 1136 05/11/19 1650  Weight: 68 kg 65.6 kg    ROS: Review of Systems  Unable to perform ROS: Acuity of condition  Respiratory: Negative for shortness of breath.   Cardiovascular: Negative for chest pain.  Gastrointestinal: Positive for constipation. Negative for abdominal pain.   Exam: Physical Exam  HENT:  Nose: No mucosal edema.  Mouth/Throat: No oropharyngeal exudate or posterior oropharyngeal edema.  Eyes: Pupils are equal, round, and reactive to light. Conjunctivae, EOM and lids are normal.  Neck: No JVD present. Carotid bruit is not present. No edema present. No thyroid mass and no thyromegaly present.  Cardiovascular: S1 normal and S2 normal. Exam reveals no gallop.  No murmur heard. Respiratory: No respiratory distress. He has decreased breath sounds in the right lower field and the left lower field. He has no wheezes. He has no rhonchi. He has no rales.  GI: Soft. Bowel sounds are normal. There is no abdominal tenderness.  Musculoskeletal:     Right ankle: He exhibits swelling.     Left ankle: He exhibits swelling.   Lymphadenopathy:    He has no cervical adenopathy.  Neurological: He is alert.  Patient moves all his extremities on his own.  Skin: Skin is warm. No rash noted. Nails show no clubbing.  Psychiatric: He has a normal mood and affect.      Data Reviewed: Basic Metabolic Panel: Recent Labs  Lab 05/12/19 0029 05/12/19 0400 05/12/19 2208 05/13/19 0622 05/14/19 [id number] 05/14/19 0444 05/15/19 0610 05/16/19 0407 05/17/19 0534 05/18/19 0502  NA 142 142 138 133*  --  137 138 136 138 135  K 3.9 3.4* 3.6 3.8  --  3.5 3.8 4.4 3.7 5.0  CL 104 103 101 98  --  102 104 103 101 102  CO2 31 31 27 24   --  24 22 18* 22 23  GLUCOSE 156* 179* 174* 148*  --  120* 84 138* 164* 156*  BUN 16 15 8 8   --  14 20 21 18  25*  CREATININE 0.63 0.58* 0.85 0.65  --  0.91 0.74 0.91 0.78 1.12  CALCIUM 8.6* 8.6* 8.7* 8.9  --  8.6* 8.4* 8.2* 8.7* 8.9  MG 1.9 1.9 1.8 1.7 1.9  --  2.3  --   --  2.0  PHOS 2.9 2.1* 2.1* 4.1  --   --   --   --   --   --    Liver Function Tests: Recent  Labs  Lab 05/11/19 1159  AST 21  ALT 30  ALKPHOS 152*  BILITOT 0.7  PROT 6.8  ALBUMIN 3.7    Recent Labs  Lab 05/13/19 1249  AMMONIA 20   CBC: Recent Labs  Lab 05/11/19 1159 05/12/19 0400 05/13/19 0622 05/15/19 0610 05/16/19 0407  WBC 8.1 8.1 9.9 10.7* 9.5  NEUTROABS 6.6  --   --   --  8.2*  HGB 13.4 12.2* 14.6 14.9 14.1  HCT 38.6* 35.0* 41.0 43.2 40.8  MCV 88.1 87.5 86.3 87.6 88.3  PLT 234 193 267 265 294    CBG: Recent Labs  Lab 05/17/19 1644 05/17/19 2104 05/18/19 0001 05/18/19 0410 05/18/19 0756  GLUCAP 218* 361* 240* 144* 189*    Recent Results (from the past 240 hour(s))  SARS CORONAVIRUS 2 (TAT 6-24 HRS) Nasopharyngeal Nasopharyngeal Swab     Status: None   Collection Time: 05/11/19 12:56 PM   Specimen: Nasopharyngeal Swab  Result Value Ref Range Status   SARS Coronavirus 2 NEGATIVE NEGATIVE Final    Comment: (NOTE) SARS-CoV-2 target nucleic acids are NOT DETECTED. The SARS-CoV-2 RNA is  generally detectable in upper and lower respiratory specimens during the acute phase of infection. Negative results do not preclude SARS-CoV-2 infection, do not rule out co-infections with other pathogens, and should not be used as the sole basis for treatment or other patient management decisions. Negative results must be combined with clinical observations, patient history, and epidemiological information. The expected result is Negative. Fact Sheet for Patients: SugarRoll.be Fact Sheet for Healthcare Providers: https://www.woods-mathews.com/ This test is not yet approved or cleared by the [place] FDA and  has been authorized for detection and/or diagnosis of SARS-CoV-2 by FDA under an Emergency Use Authorization (EUA). This EUA will remain  in effect (meaning this test can be used) for the duration of the COVID-19 declaration under Section 56 4(b)(1) of the Act, 21 U.S.C. section 360bbb-3(b)(1), unless the authorization is terminated or revoked sooner. Performed at [facility], [place] [address] [place], [place] [address]   MRSA PCR Screening     Status: None   Collection Time: 05/11/19  4:55 PM   Specimen: Nasopharyngeal  Result Value Ref Range Status   MRSA by PCR NEGATIVE NEGATIVE Final    Comment:        The GeneXpert MRSA Assay (FDA approved for NASAL specimens only), is one component of a comprehensive MRSA colonization surveillance program. It is not intended to diagnose MRSA infection nor to guide or monitor treatment for MRSA infections. Performed at [facility], [address] [place], [place] [address]       Scheduled Meds: . Chlorhexidine Gluconate Cloth  6 each Topical QHS  . diazepam  5 mg Oral BID  . diltiazem  120 mg Oral Daily  . enoxaparin (LOVENOX) injection  40 mg Subcutaneous Q24H  . insulin aspart  0-9 Units Subcutaneous Q4H  . insulin glargine  5 Units Subcutaneous QHS  .  morphine  15 mg Oral Q8H  . multivitamin with minerals  1 tablet Oral Daily  . pantoprazole  40 mg Oral Daily  . polyethylene glycol  17 g Oral Daily  . predniSONE  20 mg Oral Q breakfast  . Ensure Max Protein  11 oz Oral BID  . sodium chloride flush  3 mL Intravenous Once  . thiamine  100 mg Oral Daily  . thyroid  90 mg Oral QAC breakfast  . venlafaxine XR  150 mg Oral  Q breakfast   Continuous Infusions: . sodium chloride Stopped (05/15/19 0759)    Assessment/Plan:  1. Acute metabolic encephalopathy.  As per wife patient has improved since when he came in but not back to his baseline.  Seen by neurology and neurosurgery and no further imaging needed.  Did not have an acute event on MRI and CT scan.  I spoke with the wife about decreasing the dose of the chronic pain medication.  She would like to keep the Valium the same though.  I did also hold Lipitor. 2. Nonketotic hyperglycemia.  Much improved off insulin drip at this time on low-dose insulin. 3. History of hemorrhagic CVA and previous VP shunt. 4. Hyperlipidemia.  Hold atorvastatin for now. 5. Essential hypertension on Cardizem 6. Chronic pain.  Decrease dose of MS Contin down to 15 mg every 8 hours rather than the 30 mg. 7. Hypothyroidism on Armour Thyroid 8. Chronic respiratory failure on oxygen with pulmonary fibrosis. 9. PT and OT consultations.  Code Status:     Code Status Orders  (From admission, onward)         Start     Ordered   05/11/19 1650  Do not attempt resuscitation (DNR)  Continuous    Question Answer Comment  In the event of cardiac or respiratory ARREST Do not call a "code blue"   In the event of cardiac or respiratory ARREST Do not perform Intubation, CPR, defibrillation or ACLS   In the event of cardiac or respiratory ARREST Use medication by any route, position, wound care, and other measures to relive pain and suffering. May use oxygen, suction and manual treatment of airway obstruction as needed  for comfort.      05/11/19 1650        Code Status History    Date Active Date Inactive Code Status Order ID Comments User Context   03/14/2015 0306 03/20/2015 2217 Full Code [id number]  Lorna Few, DO Inpatient   08/12/2014 1735 08/14/2014 1800 Full Code [id number]  Nolon Rod, DO Inpatient   05/20/2014 1724 05/24/2014 1840 Full Code [id number]  Kristeen Miss, MD Inpatient   05/18/2014 0802 05/20/2014 1724 Full Code [id number]  Kristeen Miss, MD Inpatient   05/11/2014 1729 05/15/2014 1638 Full Code [id number]  Donita Brooks, NP Inpatient   Advance Care Planning Activity     Family Communication: Spoke with wife on the phone Disposition Plan: To be determined  Consultants:  Neurology  Time spent: 28 minutes  [place]

## 2019-05-18 NOTE — Progress Notes (Signed)
PT Cancellation Note  Patient Details Name: Kirk Robinson MRN: [id number] DOB: [birth year]   Cancelled Treatment:    Reason Eval/Treat Not Completed: Patient's level of consciousness.  Spoke with primary RN who stated pt is not able to meaningfully participate with PT evaluation at this time secondary to pt's current mental status/confusion. Per nursing, [place] for PT orders to be completed at this time with new PT orders to be initiated if/when patient is appropriate.     Linus Salmons PT, DPT 05/18/19, 2:10 PM

## 2019-05-18 NOTE — Progress Notes (Signed)
OT Cancellation Note  Patient Details Name: Kirk Robinson MRN: [id number] DOB: 27-Jul-1956   Cancelled Treatment:    Reason Eval/Treat Not Completed: Patient not medically ready;Patient's level of consciousness. Spoke with primary RN who stated pt is not able to meaningfully participate in OT evaluation at this time. Will follow acutely and re-attempt at a later time/date as available and pt medically appropriate for OT evaluation.   Shara Blazing, M.S., OTR/L Ascom: [phone] 05/18/19, 11:20 AM

## 2019-05-19 LAB — BASIC METABOLIC PANEL
Anion gap: 7 (ref 5–15)
BUN: 20 mg/dL (ref 8–23)
CO2: 25 mmol/L (ref 22–32)
Calcium: 8.5 mg/dL — ABNORMAL LOW (ref 8.9–10.3)
Chloride: 103 mmol/L (ref 98–111)
Creatinine, Ser: 0.85 mg/dL (ref 0.61–1.24)
GFR calc Af Amer: >60 mL/min (ref >60)
GFR calc non Af Amer: >60 mL/min (ref >60)
Glucose, Bld: 140 mg/dL — ABNORMAL HIGH (ref 70–99)
Potassium: 3.6 mmol/L (ref 3.5–5.1)
Sodium: 135 mmol/L (ref 135–145)

## 2019-05-19 LAB — CBC
HCT: 32.7 % — ABNORMAL LOW (ref 39.0–52.0)
Hemoglobin: 11.7 g/dL — ABNORMAL LOW (ref 13.0–17.0)
MCH: 30.7 pg (ref 26.0–34.0)
MCHC: 35.8 g/dL (ref 30.0–36.0)
MCV: 85.8 fL (ref 80.0–100.0)
Platelets: 357 10*3/uL (ref 150–400)
RBC: 3.81 MIL/uL — ABNORMAL LOW (ref 4.22–5.81)
RDW: 15.4 % (ref 11.5–15.5)
WBC: 7.3 10*3/uL (ref 4.0–10.5)
nRBC: 0 % (ref 0.0–0.2)

## 2019-05-19 LAB — GLUCOSE, CAPILLARY
Glucose-Capillary: 88 mg/dL (ref 70–99)
Glucose-Capillary: 125 mg/dL — ABNORMAL HIGH (ref 70–99)
Glucose-Capillary: 249 mg/dL — ABNORMAL HIGH (ref 70–99)
Glucose-Capillary: 262 mg/dL — ABNORMAL HIGH (ref 70–99)
Glucose-Capillary: 223 mg/dL — ABNORMAL HIGH (ref 70–99)

## 2019-05-19 NOTE — Consult Note (Signed)
PHARMACY CONSULT NOTE - FOLLOW UP  Pharmacy Consult for Electrolyte Monitoring and Replacement   Recent Labs: Potassium (mmol/L)  Date Value  05/19/2019 3.6  05/11/2014 4.5   Magnesium (mg/dL)  Date Value  05/18/2019 2.0   Calcium (mg/dL)  Date Value  05/19/2019 8.5 (L)   Calcium, Total (mg/dL)  Date Value  05/11/2014 8.8   Albumin (g/dL)  Date Value  05/11/2019 3.7  01/24/2018 4.1  05/05/2014 2.7 (L)   Phosphorus (mg/dL)  Date Value  05/13/2019 4.1   Sodium (mmol/L)  Date Value  05/19/2019 135  01/24/2018 142  05/11/2014 134 (L)     Assessment: 63 y/o M admitted 9/19 with HHS and AMS. Patient history significant for hydrocephalus requiring VP shunt, COPD, T2DM, pulmonary fibrosis, hemorrhagic stroke, hypothyroidism.   Pharmacy consulted for electrolyte monitoring and replacement.    Goal of Therapy:  K ~4 , Mg ~2   Plan:  9/27  KCL 3.6  Scr 0.85 No supplementation at this time.  --BMP tomorrow AM. Pharmacy will continue to replace as needed.   Noralee Space, PharmD, BCPS Clinical Pharmacist 05/19/2019 8:59 AM

## 2019-05-19 NOTE — Progress Notes (Signed)
PT Cancellation Note  Patient Details Name: Lakai Seja MRN: [id number] DOB: 31-May-1956   Cancelled Treatment:    Reason Eval/Treat Not Completed: Patient declined, no reason specified.  Order received and chart reviewed.  Linda in room and stated that pt has just drifted off to sleep.  Will check back shortly.  Roxanne Gates, PT, DPT  Roxanne Gates 05/19/2019, 1:18 PM

## 2019-05-19 NOTE — Progress Notes (Signed)
Patient ID: Kirk Robinson, male   DOB: 08-25-1955, 63 y.o.   MRN: [id number]  [facility] PROGRESS NOTE  Kirk Robinson [id number] DOB: 11-07-1955 DOA: 05/11/2019 PCP: [name], MD  HPI/Subjective: Patient answers more questions today.  Seems more alert today.  As per wife he slept yesterday most of the afternoon.  She thinks that he is better today but not back to his baseline just yet.  Patient did not offer any complaints.  Objective: Vitals:   05/19/19 0510 05/19/19 1022  BP: 90/67 107/69  Pulse: 72 76  Resp: 16   Temp: 98 F (36.7 C) 97.8 F (36.6 C)  SpO2: 100% 97%   No intake or output data in the 24 hours ending 05/19/19 1211 Filed Weights   05/11/19 1136 05/11/19 1650  Weight: 68 kg 65.6 kg    ROS: Review of Systems  Unable to perform ROS: Acuity of condition  Constitutional: Negative for chills and fever.  Eyes: Negative for blurred vision.  Respiratory: Negative for cough and shortness of breath.   Cardiovascular: Negative for chest pain.  Gastrointestinal: Negative for abdominal pain, constipation, diarrhea, nausea and vomiting.  Genitourinary: Negative for dysuria.  Musculoskeletal: Negative for joint pain.  Neurological: Negative for dizziness and headaches.   Exam: Physical Exam  HENT:  Nose: No mucosal edema.  Mouth/Throat: No oropharyngeal exudate or posterior oropharyngeal edema.  Eyes: Pupils are equal, round, and reactive to light. Conjunctivae, EOM and lids are normal.  Neck: No JVD present. Carotid bruit is not present. No edema present. No thyroid mass and no thyromegaly present.  Cardiovascular: S1 normal and S2 normal. Exam reveals no gallop.  No murmur heard. Respiratory: No respiratory distress. He has decreased breath sounds in the right lower field and the left lower field. He has no wheezes. He has no rhonchi. He has no rales.  GI: Soft. Bowel sounds are normal. There is no abdominal tenderness.  Musculoskeletal:      Right ankle: He exhibits swelling.     Left ankle: He exhibits swelling.  Lymphadenopathy:    He has no cervical adenopathy.  Neurological: He is alert.  Patient moves all his extremities on his own.  Skin: Skin is warm. No rash noted. Nails show no clubbing.  Psychiatric: He has a normal mood and affect.      Data Reviewed: Basic Metabolic Panel: Recent Labs  Lab 05/12/19 2208 05/13/19 0622 05/14/19 [id number]  05/15/19 0610 05/16/19 0407 05/17/19 0534 05/18/19 0502 05/19/19 0507  NA 138 133*  --    < > 138 136 138 135 135  K 3.6 3.8  --    < > 3.8 4.4 3.7 5.0 3.6  CL 101 98  --    < > 104 103 101 102 103  CO2 27 24  --    < > 22 18* 22 23 25   GLUCOSE 174* 148*  --    < > 84 138* 164* 156* 140*  BUN 8 8  --    < > 20 21 18  25* 20  CREATININE 0.85 0.65  --    < > 0.74 0.91 0.78 1.12 0.85  CALCIUM 8.7* 8.9  --    < > 8.4* 8.2* 8.7* 8.9 8.5*  MG 1.8 1.7 1.9  --  2.3  --   --  2.0  --   PHOS 2.1* 4.1  --   --   --   --   --   --   --    < > =  values in this interval not displayed.    Recent Labs  Lab 05/13/19 1249  AMMONIA 20   CBC: Recent Labs  Lab 05/13/19 0622 05/15/19 0610 05/16/19 0407 05/19/19 0507  WBC 9.9 10.7* 9.5 7.3  NEUTROABS  --   --  8.2*  --   HGB 14.6 14.9 14.1 11.7*  HCT 41.0 43.2 40.8 32.7*  MCV 86.3 87.6 88.3 85.8  PLT 267 265 294 357    CBG: Recent Labs  Lab 05/18/19 1702 05/18/19 2047 05/18/19 2352 05/19/19 0418 05/19/19 0753  GLUCAP 179* 149* 188* 88 125*    Recent Results (from the past 240 hour(s))  SARS CORONAVIRUS 2 (TAT 6-24 HRS) Nasopharyngeal Nasopharyngeal Swab     Status: None   Collection Time: 05/11/19 12:56 PM   Specimen: Nasopharyngeal Swab  Result Value Ref Range Status   SARS Coronavirus 2 NEGATIVE NEGATIVE Final    Comment: (NOTE) SARS-CoV-2 target nucleic acids are NOT DETECTED. The SARS-CoV-2 RNA is generally detectable in upper and lower respiratory specimens during the acute phase of infection.  Negative results do not preclude SARS-CoV-2 infection, do not rule out co-infections with other pathogens, and should not be used as the sole basis for treatment or other patient management decisions. Negative results must be combined with clinical observations, patient history, and epidemiological information. The expected result is Negative. Fact Sheet for Patients: SugarRoll.be Fact Sheet for Healthcare Providers: https://www.woods-mathews.com/ This test is not yet approved or cleared by the [place] FDA and  has been authorized for detection and/or diagnosis of SARS-CoV-2 by FDA under an Emergency Use Authorization (EUA). This EUA will remain  in effect (meaning this test can be used) for the duration of the COVID-19 declaration under Section 56 4(b)(1) of the Act, 21 U.S.C. section 360bbb-3(b)(1), unless the authorization is terminated or revoked sooner. Performed at [facility], [place] [address] [place],  [address]   MRSA PCR Screening     Status: None   Collection Time: 05/11/19  4:55 PM   Specimen: Nasopharyngeal  Result Value Ref Range Status   MRSA by PCR NEGATIVE NEGATIVE Final    Comment:        The GeneXpert MRSA Assay (FDA approved for NASAL specimens only), is one component of a comprehensive MRSA colonization surveillance program. It is not intended to diagnose MRSA infection nor to guide or monitor treatment for MRSA infections. Performed at [facility], [address] [place],  [address]       Scheduled Meds: . Chlorhexidine Gluconate Cloth  6 each Topical QHS  . diazepam  5 mg Oral BID  . enoxaparin (LOVENOX) injection  40 mg Subcutaneous Q24H  . insulin aspart  0-9 Units Subcutaneous Q4H  . insulin glargine  5 Units Subcutaneous QHS  . morphine  15 mg Oral Q8H  . multivitamin with minerals  1 tablet Oral Daily  . pantoprazole  40 mg Oral Daily  . polyethylene glycol  17  g Oral Daily  . predniSONE  20 mg Oral Q breakfast  . Ensure Max Protein  11 oz Oral BID  . sodium chloride flush  3 mL Intravenous Once  . thiamine  100 mg Oral Daily  . thyroid  90 mg Oral QAC breakfast  . venlafaxine XR  150 mg Oral Q breakfast   Continuous Infusions: . sodium chloride Stopped (05/15/19 0759)    Assessment/Plan:  1. Acute metabolic encephalopathy.  Patient is better today than yesterday..  Seen by neurology and  neurosurgery and no further imaging needed.  Did not have an acute event on MRI and CT scan.  I spoke with the wife about decreasing the dose of the chronic pain medication.  She would like to keep the Valium the same though.  I did also hold Lipitor. 2. Nonketotic hyperglycemia.  Much improved off insulin drip at this time on low-dose insulin. 3. History of hemorrhagic CVA and previous VP shunt. 4. Hyperlipidemia.  Hold atorvastatin for now. 5. Essential hypertension.  Since blood pressure is low I will also hold Cardizem CD. 6. Chronic pain.  Decreased dose of MS Contin down to 15 mg every 8 hours rather than the 30 mg. 7. Hypothyroidism on Armour Thyroid 8. Chronic respiratory failure on oxygen with pulmonary fibrosis. 9. PT and OT consultations.  Would like to see how he does with physical therapy before deciding on a disposition.  Patient interested in going home.  Code Status:     Code Status Orders  (From admission, onward)         Start     Ordered   05/11/19 1650  Do not attempt resuscitation (DNR)  Continuous    Question Answer Comment  In the event of cardiac or respiratory ARREST Do not call a "code blue"   In the event of cardiac or respiratory ARREST Do not perform Intubation, CPR, defibrillation or ACLS   In the event of cardiac or respiratory ARREST Use medication by any route, position, wound care, and other measures to relive pain and suffering. May use oxygen, suction and manual treatment of airway obstruction as needed for comfort.       05/11/19 1650        Code Status History    Date Active Date Inactive Code Status Order ID Comments User Context   03/14/2015 0306 03/20/2015 2217 Full Code [id number]  Lorna Few, DO Inpatient   08/12/2014 1735 08/14/2014 1800 Full Code [id number]  Nolon Rod, DO Inpatient   05/20/2014 1724 05/24/2014 1840 Full Code [id number]  Kristeen Miss, MD Inpatient   05/18/2014 0802 05/20/2014 1724 Full Code [id number]  Kristeen Miss, MD Inpatient   05/11/2014 1729 05/15/2014 1638 Full Code [id number]  Donita Brooks, NP Inpatient   Advance Care Planning Activity     Family Communication: Spoke with wife on the phone Disposition Plan: Would like to see how he does with physical therapy before making any further decisions.  Mental status has improved and hopefully will continue to improve during the rest of the hospital course.  Consultants:  Neurology  Time spent: 28 minutes  [place]

## 2019-05-19 NOTE — Plan of Care (Signed)

## 2019-05-19 NOTE — Evaluation (Signed)
Physical Therapy Evaluation Patient Details Name: Kirk Robinson MRN: [id number] DOB: Jul 13, 1956 Today's Date: 05/19/2019   History of Present Illness  Pt is a 63 year old male admitted in a hyperglycemic, nonketotic hyperosmolar state.  PMH includes COPD, PF, PNA and CVA.  Clinical Impression  Pt is a 63 year old male who lives in a multi level home with his wife.  He is independent with mobility but mostly a household ambulator at baseline.  Pt able to perform bed mobility with increased time.  He has just awakened and was very delayed in answering all questions.  Pt's wife did correct some of the hx pt provided.  He was able to stand from bedside with use of UE's and VC's for proper use of RW.  He appeared generally fatigued but was able to ambulate 40 ft with RW and education for use of RW, reporting SOB at 20 ft.  Pt presented as tachycardic and with mild O2 decrease at this time.  He recovered once in supine.  Pt will continue to benefit from skilled PT with focus on strength, tolerance to activity and safe functional mobility.  Pt may also benefit from pulmonary rehab at this time.     Follow Up Recommendations Home health PT(Pt may benefit from pulmonary rehab at this time.)    Equipment Recommendations  Rolling walker with 5" wheels    Recommendations for Other Services       Precautions / Restrictions Restrictions Weight Bearing Restrictions: No      Mobility  Bed Mobility Overal bed mobility: Modified Independent             General bed mobility comments: Increased time.  Transfers Overall transfer level: Needs assistance Equipment used: Rolling walker (2 wheeled) Transfers: Sit to/from Stand Sit to Stand: Min guard         General transfer comment: Able to rise with use of UE's for support.  VC's for hand placement and proper use of RW.  Ambulation/Gait Ambulation/Gait assistance: Min guard Gait Distance (Feet): 40 Feet Assistive device: Rolling walker (2  wheeled)     Gait velocity interpretation: <1.8 ft/sec, indicate of risk for recurrent falls General Gait Details: Pt generally unsteady on feet, gair step length and foot clearance.  VC's for proper use of RW required.  Pt became SOB after 20 ft of ambulation.  Stairs            Wheelchair Mobility    Modified Rankin (Stroke Patients Only)       Balance Overall balance assessment: Needs assistance Sitting-balance support: Feet supported;Bilateral upper extremity supported Sitting balance-Leahy Scale: Good     Standing balance support: Bilateral upper extremity supported Standing balance-Leahy Scale: Fair Standing balance comment: New need for RW and appearing generally unsteady on feet with lateral deviations during ambulation.  No LOB's.                             Pertinent Vitals/Pain Pain Assessment: No/denies pain    Home Living Family/patient expects to be discharged to:: Private residence Living Arrangements: Spouse/significant other Available Help at Discharge: Family;Available PRN/intermittently Type of Home: House Home Access: Stairs to enter Entrance Stairs-Rails: Can reach both Entrance Stairs-Number of Steps: 7 Home Layout: One level Home Equipment: Cane - single point;Wheelchair - manual;Hand held shower head;Bedside commode;Shower seat      Prior Function Level of Independence: Independent         Comments: Mostly  household ambulation without AD.     Hand Dominance   Dominant Hand: Right    Extremity/Trunk Assessment   Upper Extremity Assessment Upper Extremity Assessment: Overall WFL for tasks assessed(Grossly 4/5 bilaterally.)    Lower Extremity Assessment Lower Extremity Assessment: Overall WFL for tasks assessed(Grossly 4/5 bilaterally.)    Cervical / Trunk Assessment Cervical / Trunk Assessment: Kyphotic  Communication   Communication: No difficulties  Cognition Arousal/Alertness: Lethargic Behavior During  Therapy: WFL for tasks assessed/performed;Restless;Agitated Overall Cognitive Status: Impaired/Different from baseline Area of Impairment: Orientation;Memory                 Orientation Level: Disoriented to;Time   Memory: Decreased short-term memory(Pt's wife intervened during history to correct home setup, etc.)         General Comments: Pt very delayed in responses but has also just awakened.      General Comments      Exercises Other Exercises Other Exercises: Time to educate regarding benefit of home health therapy vs pulmonary rehab x4 min Other Exercises: time to monitor vitals x4 min   Assessment/Plan    PT Assessment Patient needs continued PT services  PT Problem List Decreased strength;Decreased mobility;Decreased activity tolerance;Decreased balance;Decreased knowledge of use of DME       PT Treatment Interventions DME instruction;Therapeutic activities;Gait training;Therapeutic exercise;Patient/family education;Balance training;Functional mobility training;Stair training;Cognitive remediation    PT Goals (Current goals can be found in the Care Plan section)  Acute Rehab PT Goals Patient Stated Goal: To return to general daily activity. PT Goal Formulation: With patient Time For Goal Achievement: 06/02/19 Potential to Achieve Goals: Good    Frequency Min 2X/week   Barriers to discharge        Co-evaluation               AM-PAC PT "6 Clicks" Mobility  Outcome Measure Help needed turning from your back to your side while in a flat bed without using bedrails?: None Help needed moving from lying on your back to sitting on the side of a flat bed without using bedrails?: A Little Help needed moving to and from a bed to a chair (including a wheelchair)?: A Little Help needed standing up from a chair using your arms (e.g., wheelchair or bedside chair)?: A Little Help needed to walk in hospital room?: A Little Help needed climbing 3-5 steps with a  railing? : A Lot 6 Click Score: 18    End of Session Equipment Utilized During Treatment: Gait belt;Oxygen Activity Tolerance: Patient limited by fatigue Patient left: in bed;with family/visitor present;with call bell/phone within reach;with bed alarm set Nurse Communication: Mobility status PT Visit Diagnosis: Unsteadiness on feet (R26.81);Muscle weakness (generalized) (M62.81);Difficulty in walking, not elsewhere classified (R26.2)    Time: [id number] PT Time Calculation (min) (ACUTE ONLY): 19 min   Charges:   PT Evaluation $PT Eval Low Complexity: 1 Low PT Treatments $Therapeutic Activity: 8-22 mins        Roxanne Gates, PT, DPT   Roxanne Gates 05/19/2019, 3:30 PM

## 2019-05-20 LAB — BASIC METABOLIC PANEL
Anion gap: 7 (ref 5–15)
BUN: 15 mg/dL (ref 8–23)
CO2: 28 mmol/L (ref 22–32)
Calcium: 8.9 mg/dL (ref 8.9–10.3)
Chloride: 103 mmol/L (ref 98–111)
Creatinine, Ser: 0.86 mg/dL (ref 0.61–1.24)
GFR calc Af Amer: >60 mL/min (ref >60)
GFR calc non Af Amer: >60 mL/min (ref >60)
Glucose, Bld: 190 mg/dL — ABNORMAL HIGH (ref 70–99)
Potassium: 4.1 mmol/L (ref 3.5–5.1)
Sodium: 138 mmol/L (ref 135–145)

## 2019-05-20 LAB — GLUCOSE, CAPILLARY
Glucose-Capillary: 143 mg/dL — ABNORMAL HIGH (ref 70–99)
Glucose-Capillary: 148 mg/dL — ABNORMAL HIGH (ref 70–99)
Glucose-Capillary: 149 mg/dL — ABNORMAL HIGH (ref 70–99)
Glucose-Capillary: 195 mg/dL — ABNORMAL HIGH (ref 70–99)
Glucose-Capillary: 215 mg/dL — ABNORMAL HIGH (ref 70–99)
Glucose-Capillary: 219 mg/dL — ABNORMAL HIGH (ref 70–99)

## 2019-05-20 MED ORDER — INSULIN GLARGINE 100 UNIT/ML ~~LOC~~ SOLN
8.0000 [IU] | Freq: Every day | SUBCUTANEOUS | Status: DC
  Administered 2019-05-20: 21:00:00 8 [IU] via SUBCUTANEOUS
  Filled 2019-05-20 (×2): qty 0.08

## 2019-05-20 NOTE — Plan of Care (Signed)

## 2019-05-20 NOTE — Evaluation (Signed)
Occupational Therapy Evaluation Patient Details Name: Kirk Robinson MRN: [id number] DOB: Jun 23, 1956 Today's Date: 05/20/2019    History of Present Illness Pt is a 63 year old male admitted in a hyperglycemic, nonketotic hyperosmolar state.  PMH includes COPD, PF, PNA and CVA.   Clinical Impression   [name] was seen for OT evaluation this date. Pt continues to be somewhat limited by cognitive status. At times requiring re-direction to tasks at hand. Information on PLOF and home set-up obtained from chart. Per chart, was independent in all ADLs and mobility, living in a 1 story home with his wife. Pt reports being on 3 liters of O2 at home. Pt reports becoming easily fatigued or out of breath with minimize exertion. Pt currently requires minimal to moderate assist for heavy ADL mgt including lower body dressing and bathing due to poor activity tolerance. Pt educated in energy conservation conservation strategies including pursed lip breathing and activity pacing. Pt intermittently able to follow 1-step commands with VCs for redirection as needed throughout assessment. Pt verbalized understanding but would benefit from additional skilled OT services to maximize recall and carryover of learned techniques and facilitate implementation of learned techniques into daily routines. Upon discharge, recommend HHOT services to maximize pt safety and functional independence during meaningful occupations of daily life.        Follow Up Recommendations  Home health OT    Equipment Recommendations  None recommended by OT(Pt has shower seat and BSC.)    Recommendations for Other Services       Precautions / Restrictions Precautions Precautions: Fall Precaution Comments: High Fall Restrictions Weight Bearing Restrictions: No      Mobility Bed Mobility Overal bed mobility: Modified Independent             General bed mobility comments: Increased time.  Transfers Overall transfer level:  Needs assistance Equipment used: Rolling walker (2 wheeled) Transfers: Sit to/from Stand Sit to Stand: Min guard         General transfer comment: Able to rise with use of UE's for support.  VC's for hand placement and proper use of RW.    Balance Overall balance assessment: Needs assistance Sitting-balance support: Feet supported;Bilateral upper extremity supported;Single extremity supported Sitting balance-Leahy Scale: Good Sitting balance - Comments: Steady static sitting, reaching with BOS.   Standing balance support: Bilateral upper extremity supported Standing balance-Leahy Scale: Fair                             ADL either performed or assessed with clinical judgement   ADL Overall ADL's : Needs assistance/impaired                                       General ADL Comments: Pt requires set-up/supervision for functiional mobility and self-feeding this date. Requires consistent cues for PLB during functional tasks to minimize SOB and support activity tolerance. Fatigues quickly. Pt was able to don pajama bottoms at bed-level, but required significantly inreased time and took frequent rest breaks 2/2 SOB.     Vision Baseline Vision/History: Wears glasses Wears Glasses: At all times Patient Visual Report: No change from baseline Additional Comments: Pt denies changes to vision, will continue to monitor as pt unreliable historian at times.     Perception     Praxis      Pertinent Vitals/Pain Pain Assessment: 0-10 Pain  Score: 8  Pain Location: Pt states he hurts "from top to bottom" Also c/o of "restless leg" pains/tingling Pain Descriptors / Indicators: Sore;Cramping;Tingling Pain Intervention(s): Limited activity within patient's tolerance;Monitored during session;Repositioned;Utilized relaxation techniques     Hand Dominance Right   Extremity/Trunk Assessment Upper Extremity Assessment Upper Extremity Assessment: Overall WFL for tasks  assessed(Grossly 4/5 in BUE. Grip and [facility] WFL, pt movements slowed, but suspect delayed processing rather than [name] issue.)   Lower Extremity Assessment Lower Extremity Assessment: Overall WFL for tasks assessed;Defer to PT evaluation   Cervical / Trunk Assessment Cervical / Trunk Assessment: Kyphotic   Communication Communication Communication: No difficulties   Cognition Arousal/Alertness: Lethargic Behavior During Therapy: WFL for tasks assessed/performed Overall Cognitive Status: Impaired/Different from baseline Area of Impairment: Orientation;Memory                 Orientation Level: Disoriented to;Time   Memory: Decreased short-term memory         General Comments: Pt very delayed in responses but has also just awakened.   General Comments  Pt HR and SpO2 monitored t/o session. Pt on 2L [place] t/o assessment. Initial reading with pt supine in bed: SpO2 97%, HR 87. Once seated EOB: SpO2 dropped to 92 rebounded to 96 with cues for PLB, HR 107. HR remained in low 100's during functional activity. Pt left in room recliner with HR 80 and SpO2 stable at 96-97.    Exercises Other Exercises Other Exercises: Pt educated in falls prevention strategies, safe use of AE for ADL mgt and functional mobility, as well as energy conservation strategies including pursed lip breathing and activity pacing to support activity tolerance and minimze SOB during functional activity.   Shoulder Instructions      Home Living Family/patient expects to be discharged to:: Private residence Living Arrangements: Spouse/significant other Available Help at Discharge: Family;Available PRN/intermittently Type of Home: House Home Access: Stairs to enter [employer] of Steps: 7 Entrance Stairs-Rails: Can reach both Home Layout: One level     Bathroom Shower/Tub: Teacher, early years/pre: Standard Bathroom Accessibility: Yes   Home Equipment: Cane - single point;Wheelchair -  manual;Hand held shower head;Bedside commode;Shower seat          Prior Functioning/Environment Level of Independence: Independent        Comments: Mostly household ambulation without AD.        OT Problem List: Decreased strength;Decreased coordination;Cardiopulmonary status limiting activity;Decreased activity tolerance;Decreased safety awareness;Decreased knowledge of use of DME or AE;Impaired balance (sitting and/or standing)      OT Treatment/Interventions: Self-care/ADL training;Balance training;Therapeutic exercise;Therapeutic activities;Energy conservation;DME and/or AE instruction;Patient/family education    OT Goals(Current goals can be found in the care plan section) Acute Rehab OT Goals Patient Stated Goal: To return to general daily activity. OT Goal Formulation: With patient Time For Goal Achievement: 06/03/19 Potential to Achieve Goals: Good ADL Goals Pt Will Perform Grooming: sitting;with modified independence(W/ LRAD prn for improved safety and functional independence.) Pt Will Transfer to Toilet: ambulating;with supervision;bedside commode;regular height toilet;with set-up(W/ LRAD prn for improved safety and functional independence.) Additional ADL Goal #1: Pt will independently verbalize a plan to implement at least 3 learned energy conservation strategies into his daily routines/home environment for improved safety and functional independence upon hospital DC.  OT Frequency: Min 1X/week   Barriers to D/C: Decreased caregiver support          Co-evaluation              AM-PAC OT "  6 Clicks" Daily Activity     Outcome Measure Help from another person eating meals?: A Little Help from another person taking care of personal grooming?: A Little Help from another person toileting, which includes using toliet, bedpan, or urinal?: A Little Help from another person bathing (including washing, rinsing, drying)?: A Lot Help from another person to put on and  taking off regular upper body clothing?: A Little Help from another person to put on and taking off regular lower body clothing?: A Little 6 Click Score: 17   End of Session Equipment Utilized During Treatment: Gait belt;Rolling walker  Activity Tolerance: Patient tolerated treatment well Patient left: in chair;with call bell/phone within reach;with chair alarm set  OT Visit Diagnosis: Muscle weakness (generalized) (M62.81);Other abnormalities of gait and mobility (R26.89)                Time: [id number] OT Time Calculation (min): 31 min Charges:  OT General Charges $OT Visit: 1 Visit OT Evaluation $OT Eval Moderate Complexity: 1 Mod OT Treatments $Self Care/Home Management : 23-37 mins  Shara Blazing, M.S., OTR/L Ascom: [phone] 05/20/19, 12:31 PM

## 2019-05-20 NOTE — TOC Initial Note (Signed)
Transition of Care [facility]) - Initial/Assessment Note    Patient Details  Name: Kirk Robinson MRN: [id number] Date of Birth: 1955-12-08  Transition of Care [facility]) CM/SW Contact:    Tysen Roesler, Lenice Llamas Phone Number: [phone]  05/20/2019, 11:48 AM  Clinical Narrative: PT is recommending home health. Clinical Education officer, museum (CSW) met with patient alone at bedside to discuss D/C plan. Per chart patient lives in [place]. Patient reported that he lives with his wife Kirk Robinson. Patient reported that he has oxygen and a walker at home. CSW explained home health process and provided patient CMS home health list. Patient is agreeable to home health and does not have a preference for a home health agency. Per Lauretta Chester home health representative they can accept patient for PT and RN. Patient is agreeable to [facility] home health. CSW attempted to contact patient's wife Kirk Robinson however she did not answer and a voicemail was left. CSW will continue to follow and assist as needed.                Expected Discharge Plan: [place] Barriers to Discharge: Continued Medical Work up   Patient Goals and CMS Choice Patient states their goals for this hospitalization and ongoing recovery are:: To go home. CMS Medicare.gov Compare Post Acute Care list provided to:: Patient Choice offered to / list presented to : Patient  Expected Discharge Plan and Services Expected Discharge Plan: [place] In-house Referral: Clinical Social Work   Post Acute Care Choice: [place] arrangements for the past 2 months: [place]: RN, PT [facility] Agency: [facility] Date [place]: 05/20/19 Time [place]: [place] Representative spoke with at [place]: [place] Arrangements/Services Living arrangements for the past 2 months: [place] with:: Spouse Patient language and need  for interpreter reviewed:: No Do you feel safe going back to the place where you live?: Yes      Need for Family Participation in Patient Care: Yes (Comment) Care giver support system in place?: Yes (comment) Current home services: DME(Patient reported that he has a walker and oxygen at home.) Criminal Activity/Legal Involvement Pertinent to Current Situation/Hospitalization: No - Comment as needed  Activities of Daily Living      Permission Sought/Granted Permission sought to share information with : Other (comment)(Home Health agency) Permission granted to share information with : Yes, Verbal Permission Granted              Emotional Assessment Appearance:: Appears stated age   Affect (typically observed): Calm, Pleasant Orientation: : Oriented to Self, Oriented to Place, Fluctuating Orientation (Suspected and/or reported Sundowners), Oriented to Situation Alcohol / Substance Use: Not Applicable Psych Involvement: No (comment)  Admission diagnosis:  Pulmonary fibrosis (HCC) [J84.10] Confusion [R41.0] Unintentional weight loss [R63.4] Type 2 diabetes mellitus with hyperosmolarity without nonketotic hyperglycemic-hyperosmolar coma [facility] ([place]) [E11.00] Patient Active Problem List   Diagnosis Date Noted  . Type 2 diabetes mellitus with hyperosmolar nonketotic hyperglycemia ([place]) 05/11/2019  . Type 2 diabetes mellitus without complication, without long-term current use of insulin ([place]) 03/19/2017  . Anxiety and depression 03/19/2017  . Amnesia 03/19/2017  . Malnutrition of moderate degree ([place]) 03/19/2015  . Atrial fibrillation ([place])   . Hydrocephalus in adult [facility]) 05/18/2014  . 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 Chronic pain syndrome 05/11/2014  . Pulmonary fibrosis ([place]) 01/14/2013  . GERD (gastroesophageal reflux disease) 01/14/2013  . Pure hypercholesterolemia 01/14/2013  . Hyperlipidemia 05/30/2012  . Depression 05/30/2012  . COPD (chronic obstructive  pulmonary disease) ([place]) 05/30/2012  . Degenerative disc disease 05/30/2012  . Hypothyroidism following radioiodine therapy 05/30/2012  . Osteoporosis 05/30/2012  . Peripheral vascular disease ([place]) 05/30/2012  . Tobacco abuse 05/30/2012  . Adult hypothyroidism 01/07/2010  . History of colon polyps 09/30/2008  . 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

## 2019-05-20 NOTE — Progress Notes (Addendum)
Inpatient Diabetes Program Recommendations  AACE/ADA: New Consensus Statement on Inpatient Glycemic Control (2015)  Target Ranges:  Prepandial:   less than 140 mg/dL      Peak postprandial:   less than 180 mg/dL (1-2 hours)      Critically ill patients:  140 - 180 mg/dL   Results for LATON, GLASPER (MRN [id number]) as of 05/20/2019 08:07  Ref. Range 05/18/2019 23:52 05/19/2019 04:18 05/19/2019 07:53 05/19/2019 12:16 05/19/2019 16:58 05/19/2019 21:08  Glucose-Capillary Latest Ref Range: 70 - 99 mg/dL 188 (H) 88 125 (H)  1 units NOVOLOG  249 (H)  3 units NOVOLOG  262 (H)  5 units NOVOLOG  223 (H)  3 units NOVOLOG    Results for ELPIDIO, HEINDL (MRN [id number]) as of 05/17/2019 08:22  Ref. Range 01/24/2018 11:00 05/11/2019 11:59  Hemoglobin A1C Latest Ref Range: 4.8 - 5.6 % 6.5 (H) 13.2 (H)  (332 mg/dl)    Admit with: HHNK, AMS   History: DM, ETOH use, COPD, Pulmonary Fibrosis  Home DM Meds: Metformin 1000 mg BID  Current Orders: Lantus 5 units QHS                            Novolog Sensitive Correction Scale/ SSI (0-9 units) Q4 hours       Getting Prednisone 20 mg Daily.  Takes Prednisone at home on Chronic basis.    MD- Note pt's Current A1c is significantly elevated to 13.2%.  Getting PO Dysphagic diet in hospital.  PO intake poor but pt is drinking PO Protein Supps BID (Ensure Max).  Note patient taking Prednisone at home that may have led to the Hyperglycemic crisis which brought pt to the hospital.  Not sure if patient will need insulin for home??  Not getting much Lantus.  May consider the following to determine home DM medication needs:  1. Stop Lantus for now  2. Change/Increase Novolog SSI to Moderate scale (0-15 units) TID AC + HS  3. Restart home dose of Metformin if safe  4. Start a 2nd oral DM medication to see if it will help with glucose control for home without insulin for home----Recommend Amaryl 2 mg Daily to  start     --Will follow patient during hospitalization--  Wyn Quaker RN, MSN, CDE Diabetes Coordinator Inpatient Glycemic Control Team Team Pager: [phone] (8a-5p)

## 2019-05-20 NOTE — Consult Note (Signed)
PHARMACY CONSULT NOTE - FOLLOW UP  Pharmacy Consult for Electrolyte Monitoring and Replacement   Recent Labs: Potassium (mmol/L)  Date Value  05/20/2019 4.1  05/11/2014 4.5   Magnesium (mg/dL)  Date Value  05/18/2019 2.0   Calcium (mg/dL)  Date Value  05/20/2019 8.9   Calcium, Total (mg/dL)  Date Value  05/11/2014 8.8   Albumin (g/dL)  Date Value  05/11/2019 3.7  01/24/2018 4.1  05/05/2014 2.7 (L)   Phosphorus (mg/dL)  Date Value  05/13/2019 4.1   Sodium (mmol/L)  Date Value  05/20/2019 138  01/24/2018 142  05/11/2014 134 (L)     Assessment: 63 y/o M admitted 9/19 with HHS and AMS. Patient history significant for hydrocephalus requiring VP shunt, COPD, T2DM, pulmonary fibrosis, hemorrhagic stroke, hypothyroidism.   Pharmacy consulted for electrolyte monitoring and replacement.    Goal of Therapy:  K ~4 , Mg ~2   Plan:  9/28  K 4.1  Scr 0.86 No supplementation at this time.  --BMP tomorrow AM. Pharmacy will continue to replace as needed.   Rowland Lathe, PharmD Clinical Pharmacist 05/20/2019 7:21 AM

## 2019-05-20 NOTE — Progress Notes (Signed)
Chart reviewed. No reports of dysphagia with current Dysphagia 2 diet. Nsg. reports toleration of diet with no swallowing problems noted. Pt is currently unavailable to speak with. Rec. continue with current Dysphagia 2 diet as Pt requested soft foods during bedside swallow eval. Please notify St of any further concerns.

## 2019-05-20 NOTE — Progress Notes (Signed)
Patient ID: Kirk Robinson, male   DOB: Jun 29, 1956, 63 y.o.   MRN: [id number]  [facility] PROGRESS NOTE  Kirk Robinson [id number] DOB: [birth year] DOA: 05/11/2019 PCP: [name], MD  HPI/Subjective: Patient answers some simple yes/no questions.  Offers no complaints.  Objective: Vitals:   05/20/19 0527 05/20/19 0842  BP: 108/69 119/74  Pulse: 81 74  Resp: 16   Temp: 98.4 F (36.9 C) (!) 97.4 F (36.3 C)  SpO2: 99% 99%    Intake/Output Summary (Last 24 hours) at 05/20/2019 1206 Last data filed at 05/20/2019 0412 Gross per 24 hour  Intake 240 ml  Output 450 ml  Net -210 ml   Filed Weights   05/11/19 1136 05/11/19 1650  Weight: 68 kg 65.6 kg    ROS: Review of Systems  Unable to perform ROS: Acuity of condition  Constitutional: Negative for chills and fever.  Eyes: Negative for blurred vision.  Respiratory: Negative for cough and shortness of breath.   Cardiovascular: Negative for chest pain.  Gastrointestinal: Negative for abdominal pain, constipation, diarrhea, nausea and vomiting.  Genitourinary: Negative for dysuria.  Musculoskeletal: Negative for joint pain.  Neurological: Negative for dizziness and headaches.   Exam: Physical Exam  HENT:  Nose: No mucosal edema.  Mouth/Throat: No oropharyngeal exudate or posterior oropharyngeal edema.  Eyes: Pupils are equal, round, and reactive to light. Conjunctivae, EOM and lids are normal.  Neck: No JVD present. Carotid bruit is not present. No edema present. No thyroid mass and no thyromegaly present.  Cardiovascular: S1 normal and S2 normal. Exam reveals no gallop.  No murmur heard. Respiratory: No respiratory distress. He has decreased breath sounds in the right lower field and the left lower field. He has no wheezes. He has no rhonchi. He has no rales.  GI: Soft. Bowel sounds are normal. There is no abdominal tenderness.  Musculoskeletal:     Right ankle: He exhibits swelling.     Left ankle: He  exhibits swelling.  Lymphadenopathy:    He has no cervical adenopathy.  Neurological: He is alert.  Patient moves all his extremities on his own.  Skin: Skin is warm. No rash noted. Nails show no clubbing.  Psychiatric: He has a normal mood and affect.      Data Reviewed: Basic Metabolic Panel: Recent Labs  Lab 05/14/19 0438  05/15/19 0610 05/16/19 0407 05/17/19 0534 05/18/19 0502 05/19/19 0507 05/20/19 0511  NA  --    < > 138 136 138 135 135 138  K  --    < > 3.8 4.4 3.7 5.0 3.6 4.1  CL  --    < > 104 103 101 102 103 103  CO2  --    < > 22 18* 22 23 25 28   GLUCOSE  --    < > 84 138* 164* 156* 140* 190*  BUN  --    < > 20 21 18  25* 20 15  CREATININE  --    < > 0.74 0.91 0.78 1.12 0.85 0.86  CALCIUM  --    < > 8.4* 8.2* 8.7* 8.9 8.5* 8.9  MG 1.9  --  2.3  --   --  2.0  --   --    < > = values in this interval not displayed.    Recent Labs  Lab 05/13/19 1249  AMMONIA 20   CBC: Recent Labs  Lab 05/15/19 0610 05/16/19 0407 05/19/19 0507  WBC 10.7* 9.5 7.3  NEUTROABS  --  8.2*  --  HGB 14.9 14.1 11.7*  HCT 43.2 40.8 32.7*  MCV 87.6 88.3 85.8  PLT 265 294 357    CBG: Recent Labs  Lab 05/19/19 1658 05/19/19 2108 05/20/19 0018 05/20/19 0408 05/20/19 0758  GLUCAP 262* 223* 143* 219* 148*    Recent Results (from the past 240 hour(s))  SARS CORONAVIRUS 2 (TAT 6-24 HRS) Nasopharyngeal Nasopharyngeal Swab     Status: None   Collection Time: 05/11/19 12:56 PM   Specimen: Nasopharyngeal Swab  Result Value Ref Range Status   SARS Coronavirus 2 NEGATIVE NEGATIVE Final    Comment: (NOTE) SARS-CoV-2 target nucleic acids are NOT DETECTED. The SARS-CoV-2 RNA is generally detectable in upper and lower respiratory specimens during the acute phase of infection. Negative results do not preclude SARS-CoV-2 infection, do not rule out co-infections with other pathogens, and should not be used as the sole basis for treatment or other patient management  decisions. Negative results must be combined with clinical observations, patient history, and epidemiological information. The expected result is Negative. Fact Sheet for Patients: SugarRoll.be Fact Sheet for Healthcare Providers: https://www.woods-mathews.com/ This test is not yet approved or cleared by the [place] FDA and  has been authorized for detection and/or diagnosis of SARS-CoV-2 by FDA under an Emergency Use Authorization (EUA). This EUA will remain  in effect (meaning this test can be used) for the duration of the COVID-19 declaration under Section 56 4(b)(1) of the Act, 21 U.S.C. section 360bbb-3(b)(1), unless the authorization is terminated or revoked sooner. Performed at [facility], [place] [address] [place], [place] [address]   MRSA PCR Screening     Status: None   Collection Time: 05/11/19  4:55 PM   Specimen: Nasopharyngeal  Result Value Ref Range Status   MRSA by PCR NEGATIVE NEGATIVE Final    Comment:        The GeneXpert MRSA Assay (FDA approved for NASAL specimens only), is one component of a comprehensive MRSA colonization surveillance program. It is not intended to diagnose MRSA infection nor to guide or monitor treatment for MRSA infections. Performed at [facility], [address] [place], [place] [address]       Scheduled Meds: . Chlorhexidine Gluconate Cloth  6 each Topical QHS  . diazepam  5 mg Oral BID  . enoxaparin (LOVENOX) injection  40 mg Subcutaneous Q24H  . insulin aspart  0-9 Units Subcutaneous Q4H  . insulin glargine  5 Units Subcutaneous QHS  . morphine  15 mg Oral Q8H  . multivitamin with minerals  1 tablet Oral Daily  . pantoprazole  40 mg Oral Daily  . polyethylene glycol  17 g Oral Daily  . predniSONE  20 mg Oral Q breakfast  . Ensure Max Protein  11 oz Oral BID  . sodium chloride flush  3 mL Intravenous Once  . thiamine  100 mg Oral Daily  . thyroid  90 mg  Oral QAC breakfast  . venlafaxine XR  150 mg Oral Q breakfast   Continuous Infusions: . sodium chloride Stopped (05/15/19 0759)    Assessment/Plan:  1. Acute metabolic encephalopathy.  Patient has improved as per wife but not yet back to baseline.  I told the wife that this should be clear as he gets in his home environment.  Seen by neurology and neurosurgery and no further imaging needed.  Did not have an acute event on MRI and CT scan.  I spoke with the wife about decreasing the dose of the chronic pain medication.  She would like to keep the Valium the same though.  I did also hold Lipitor.  The patient's wife is at work today and unable to take the patient home today. 2. Nonketotic hyperglycemia.  Much improved off insulin drip at this time on low-dose insulin.  [name] concerned about injecting insulin and needs to be taught.  She will come in around dinnertime and will have nursing staff teach the patient and [name] how to inject insulin. 3. History of hemorrhagic CVA and previous VP shunt. 4. Hyperlipidemia.  Hold atorvastatin for now. 5. Essential hypertension.  Blood pressure stable off medication. 6. Chronic pain.  Decreased dose of MS Contin down to 15 mg every 8 hours rather than the 30 mg. 7. Hypothyroidism on [place] Thyroid 8. Chronic respiratory failure on oxygen with pulmonary fibrosis. 9. PT and OT consultations.  Physical therapy recommending home with home health.  Code Status:     Code Status Orders  (From admission, onward)         Start     Ordered   05/11/19 1650  Do not attempt resuscitation (DNR)  Continuous    Question Answer Comment  In the event of cardiac or respiratory ARREST Do not call a "code blue"   In the event of cardiac or respiratory ARREST Do not perform Intubation, CPR, defibrillation or ACLS   In the event of cardiac or respiratory ARREST Use medication by any route, position, wound care, and other measures to relive pain and suffering. May use  oxygen, suction and manual treatment of airway obstruction as needed for comfort.      05/11/19 1650        Code Status History    Date Active Date Inactive Code Status Order ID Comments User Context   03/14/2015 0306 03/20/2015 2217 Full Code [id number]  Lorna Few, DO Inpatient   08/12/2014 1735 08/14/2014 1800 Full Code [id number]  Nolon Rod, DO Inpatient   05/20/2014 1724 05/24/2014 1840 Full Code [id number]  Kristeen Miss, MD Inpatient   05/18/2014 0802 05/20/2014 1724 Full Code [id number]  Kristeen Miss, MD Inpatient   05/11/2014 1729 05/15/2014 1638 Full Code [id number]  Donita Brooks, NP Inpatient   Advance Care Planning Activity     Family Communication: Spoke with wife on the phone Disposition Plan: I was hopeful on potential discharge today but the wife is working.  Will discharge tomorrow morning.  Consultants:  Neurology  Time spent: 28 minutes  [place]

## 2019-05-20 NOTE — TOC Progression Note (Signed)
Transition of Care [facility]) - Progression Note    Patient Details  Name: Tyvone Steinwand MRN: [id number] Date of Birth: Dec 31, 1955  Transition of Care [facility]) CM/SW Contact  Kayler Rise, Lenice Llamas Phone Number: [phone]  05/20/2019, 2:06 PM  Clinical Narrative: Clinical Social Worker ([place]) contacted patient's wife [name] and made her aware that [facility] home health will provide services at home for patient. Wife is agreeable to [facility]. Per wife patient needs a rolling walker. [employer] agency representative is aware of above. Per wife patient does have chronic oxygen at home through [employer]. Per [facility] home health representative they can accept patient for PT, OT and RN. CSW will continue to follow and assist as needed.      Expected Discharge Plan: [place] Barriers to Discharge: Continued Medical Work up  Expected Discharge Plan and Services Expected Discharge Plan: [place] In-house Referral: Clinical Social Work   Post Acute Care Choice: [place] arrangements for the past 2 months: [place]: RN, PT [facility] Agency: [facility] Date [place]: 05/20/19 Time [place]: 1015 Representative spoke with at [place]: [place] ([place]) Interventions    Readmission Risk Interventions No flowsheet data found.

## 2019-05-21 LAB — BASIC METABOLIC PANEL
Anion gap: 12 (ref 5–15)
BUN: 12 mg/dL (ref 8–23)
CO2: 26 mmol/L (ref 22–32)
Calcium: 9.2 mg/dL (ref 8.9–10.3)
Chloride: 104 mmol/L (ref 98–111)
Creatinine, Ser: 0.95 mg/dL (ref 0.61–1.24)
GFR calc Af Amer: >60 mL/min (ref >60)
GFR calc non Af Amer: >60 mL/min (ref >60)
Glucose, Bld: 87 mg/dL (ref 70–99)
Potassium: 4.2 mmol/L (ref 3.5–5.1)
Sodium: 142 mmol/L (ref 135–145)

## 2019-05-21 LAB — MAGNESIUM: Magnesium: 2.1 mg/dL (ref 1.7–2.4)

## 2019-05-21 LAB — GLUCOSE, CAPILLARY
Glucose-Capillary: 268 mg/dL — ABNORMAL HIGH (ref 70–99)
Glucose-Capillary: 79 mg/dL (ref 70–99)
Glucose-Capillary: 84 mg/dL (ref 70–99)
Glucose-Capillary: 89 mg/dL (ref 70–99)

## 2019-05-21 MED ORDER — MORPHINE SULFATE ER 15 MG PO TBCR: 15.0000 mg | EXTENDED_RELEASE_TABLET | Freq: Three times a day (TID) | ORAL | 0 refills | Status: DC

## 2019-05-21 MED ORDER — ENSURE MAX PROTEIN PO LIQD: 11.0000 [oz_av] | Freq: Two times a day (BID) | ORAL | 0 refills | Status: AC

## 2019-05-21 MED ORDER — INSULIN ASPART 100 UNIT/ML ~~LOC~~ SOLN: 0.0000 [IU] | Freq: Every day | SUBCUTANEOUS | Status: DC

## 2019-05-21 MED ORDER — ALBUTEROL SULFATE HFA 108 (90 BASE) MCG/ACT IN AERS: 2.0000 | INHALATION_SPRAY | Freq: Four times a day (QID) | RESPIRATORY_TRACT | 0 refills | Status: AC | PRN

## 2019-05-21 MED ORDER — LINAGLIPTIN 5 MG PO TABS: 5.0000 mg | ORAL_TABLET | Freq: Every day | ORAL | 0 refills | Status: DC

## 2019-05-21 MED ORDER — INSULIN ASPART 100 UNIT/ML ~~LOC~~ SOLN
0.0000 [IU] | Freq: Three times a day (TID) | SUBCUTANEOUS | Status: DC
  Administered 2019-05-21: 13:00:00 5 [IU] via SUBCUTANEOUS

## 2019-05-21 MED ORDER — THIAMINE HCL 100 MG PO TABS: 100.0000 mg | ORAL_TABLET | Freq: Every day | ORAL | 0 refills | Status: DC

## 2019-05-21 MED ORDER — POLYETHYLENE GLYCOL 3350 17 G PO PACK: 17.0000 g | PACK | Freq: Every day | ORAL | 0 refills | Status: DC

## 2019-05-21 NOTE — Consult Note (Signed)
PHARMACY CONSULT NOTE - FOLLOW UP  Pharmacy Consult for Electrolyte Monitoring and Replacement   Recent Labs: Potassium (mmol/L)  Date Value  05/21/2019 4.2  05/11/2014 4.5   Magnesium (mg/dL)  Date Value  05/21/2019 2.1   Calcium (mg/dL)  Date Value  05/21/2019 9.2   Calcium, Total (mg/dL)  Date Value  05/11/2014 8.8   Albumin (g/dL)  Date Value  05/11/2019 3.7  01/24/2018 4.1  05/05/2014 2.7 (L)   Phosphorus (mg/dL)  Date Value  05/13/2019 4.1   Sodium (mmol/L)  Date Value  05/21/2019 142  01/24/2018 142  05/11/2014 134 (L)     Assessment: 63 y/o M admitted 9/19 with HHS and AMS. Patient history significant for hydrocephalus requiring VP shunt, COPD, T2DM, pulmonary fibrosis, hemorrhagic stroke, hypothyroidism.   Pharmacy consulted for electrolyte monitoring and replacement.    Goal of Therapy:  K ~4 , Mg ~2   Plan:  9/28  K 4.1  Scr 0.86 9/29  K 4.2  Scr 0.95  Electrolytes WNL's x 2 days  Pharmacy will sign off at this time.   Lu Duffel, PharmD, BCPS Clinical Pharmacist 05/21/2019 7:16 AM

## 2019-05-21 NOTE — TOC Transition Note (Signed)
Transition of Care [facility]) - CM/SW Discharge Note   Patient Details  Name: Kirk Robinson MRN: [id number] Date of Birth: [birth year]  Transition of [facility]) CM/SW Contact:  Rocko Fesperman, Lenice Llamas Phone Number: [phone]  05/21/2019, 10:41 AM   Clinical Narrative: Clinical Social Worker (CSW) notified [facility]uretta [facility] home health representative that patient will D/C home today. MD ordered home health PT, OT and aide. Per [place] they can provide those services. [employer] agency representative is aware that patient needs a rolling walker. Patient is on chronic oxygen through [employer] at home. RN aware of above. Please reconsult if future social work needs arise. CSW signing off.      Final next level of care: [place] Barriers to Discharge: Barriers Resolved   Patient Goals and CMS Choice Patient states their goals for this hospitalization and ongoing recovery are:: To go home. CMS Medicare.gov Compare Post Acute Care list provided to:: Patient Choice offered to / list presented to : Patient  Discharge Placement                       Discharge Plan and Services In-house Referral: Clinical Social Work   Post Acute Care Choice: Home Health          DME Arranged: Gilford Rile rolling DME Agency: [employer] Date DME Agency Contacted: 05/20/19 Time DME Agency Contacted: 39 Representative spoke with at DME Agency: [place]: PT, OT, Nurse's Aide [place] Agency: [facility] Date [place]: 05/21/19 Time [place]: 1008 Representative spoke with at [place]: [place] ([place]) Interventions     Readmission Risk Interventions No flowsheet data found.

## 2019-05-21 NOTE — Progress Notes (Signed)
Physical Therapy Treatment Patient Details Name: Kirk Robinson MRN: [id number] DOB: 1956-01-06 Today's Date: 05/21/2019    History of Present Illness Pt is a 63 year old male admitted in a hyperglycemic, nonketotic hyperosmolar state.  PMH includes COPD, PF, PNA and CVA.    PT Comments    Patient alert, in bed, agreeable to PT, oriented to self and situation, behavior WFLs. Reported no pain during session. On 3L via [place], spO2 >90% throughout mobility. Pt demonstrated mod I bed mobility (use of bed rails and extended time). Sit <> stand with RW and CGA, pt ambulated ~53ft with RW and CGA. Pt with improved balance this session, Pt often stopped to look around during ambulation no overt LOB noted. Overall the patient demonstrated improvement towards goals and would benefit from further skilled PT intervention to return to PLOF.     Follow Up Recommendations  Home health PT(may benefit from pulmonary rehab)     Equipment Recommendations  Rolling walker with 5" wheels    Recommendations for Other Services       Precautions / Restrictions Precautions Precautions: Fall Restrictions Weight Bearing Restrictions: No    Mobility  Bed Mobility Overal bed mobility: Modified Independent             General bed mobility comments: Increased time.  Transfers Overall transfer level: Needs assistance Equipment used: Rolling walker (2 wheeled) Transfers: Sit to/from Stand Sit to Stand: Min guard         General transfer comment: Able to rise with use of UE's for support.  VC's for hand placement and proper use of RW.  Ambulation/Gait Ambulation/Gait assistance: Min guard Gait Distance (Feet): 70 Feet Assistive device: Rolling walker (2 wheeled)       General Gait Details: Pt with improved balance this session, CGA for safety throughout with RW. Pt often stopped to look around during ambulation. spO2 >90% on 3L via [place].   Stairs             Wheelchair Mobility     Modified Rankin (Stroke Patients Only)       Balance Overall balance assessment: Needs assistance Sitting-balance support: Feet supported Sitting balance-Leahy Scale: Good     Standing balance support: Bilateral upper extremity supported Standing balance-Leahy Scale: Fair                              Cognition Arousal/Alertness: Awake/alert Behavior During Therapy: WFL for tasks assessed/performed Overall Cognitive Status: Impaired/Different from baseline                                 General Comments: Pt with improved mentation compared to this session      Exercises      General Comments        Pertinent Vitals/Pain Pain Assessment: No/denies pain    Home Living                      Prior Function            PT Goals (current goals can now be found in the care plan section) Progress towards PT goals: Progressing toward goals    Frequency    Min 2X/week      PT Plan Current plan remains appropriate    Co-evaluation              AM-PAC PT "  6 Clicks" Mobility   Outcome Measure  Help needed turning from your back to your side while in a flat bed without using bedrails?: None Help needed moving from lying on your back to sitting on the side of a flat bed without using bedrails?: A Little Help needed moving to and from a bed to a chair (including a wheelchair)?: A Little Help needed standing up from a chair using your arms (e.g., wheelchair or bedside chair)?: A Little Help needed to walk in hospital room?: A Little Help needed climbing 3-5 steps with a railing? : A Little 6 Click Score: 19    End of Session Equipment Utilized During Treatment: Gait belt;Oxygen(3L) Activity Tolerance: Patient tolerated treatment well Patient left: in chair;with chair alarm set;with call bell/phone within reach Nurse Communication: Mobility status PT Visit Diagnosis: Unsteadiness on feet (R26.81);Muscle weakness  (generalized) (M62.81);Difficulty in walking, not elsewhere classified (R26.2)     Time: [id number] PT Time Calculation (min) (ACUTE ONLY): 18 min  Charges:  $Therapeutic Exercise: 8-22 mins                     Lieutenant Diego PT, DPT 11:00 AM,05/21/19 [phone]

## 2019-05-21 NOTE — Discharge Instructions (Signed)
Blood Glucose Monitoring, Adult °Monitoring your blood sugar (glucose) is an important part of managing your diabetes (diabetes mellitus). Blood glucose monitoring involves checking your blood glucose as often as directed and keeping a record (log) of your results over time. °Checking your blood glucose regularly and keeping a blood glucose log can: °· Help you and your health care provider adjust your diabetes management plan as needed, including your medicines or insulin. °· Help you understand how food, exercise, illnesses, and medicines affect your blood glucose. °· Let you know what your blood glucose is at any time. You can quickly find out if you have low blood glucose (hypoglycemia) or high blood glucose (hyperglycemia). °Your health care provider will set individualized treatment goals for you. Your goals will be based on your age, other medical conditions you have, and how you respond to diabetes treatment. Generally, the goal of treatment is to maintain the following blood glucose levels: °· Before meals (preprandial): 80-130 mg/dL (4.4-7.2 mmol/L). °· After meals (postprandial): below 180 mg/dL (10 mmol/L). °· A1c level: less than 7%. °Supplies needed: °· Blood glucose meter. °· Test strips for your meter. Each meter has its own strips. You must use the strips that came with your meter. °· A needle to prick your finger (lancet). Do not use a lancet more than one time. °· A device that holds the lancet (lancing device). °· A journal or log book to write down your results. °How to check your blood glucose ° °1. Wash your hands with soap and water. °2. Prick the side of your finger (not the tip) with the lancet. Use a different finger each time. °3. Gently rub the finger until a small drop of blood appears. °4. Follow instructions that come with your meter for inserting the test strip, applying blood to the strip, and using your blood glucose meter. °5. Write down your result and any notes. °Some meters  allow you to use areas of your body other than your finger (alternative sites) to test your blood. The most common alternative sites are: °· Forearm. °· Thigh. °· Palm of the hand. °If you think you may have hypoglycemia, or if you have a history of not knowing when your blood glucose is getting low (hypoglycemia unawareness), do not use alternative sites. Use your finger instead. Alternative sites may not be as accurate as the fingers, because blood flow is slower in these areas. This means that the result you get may be delayed, and it may be different from the result that you would get from your finger. °Follow these instructions at home: °Blood glucose log ° °· Every time you check your blood glucose, write down your result. Also write down any notes about things that may be affecting your blood glucose, such as your diet and exercise for the day. This information can help you and your health care provider: °? Look for patterns in your blood glucose over time. °? Adjust your diabetes management plan as needed. °· Check if your meter allows you to download your records to a computer. Most glucose meters store a record of glucose readings in the meter. °If you have type 1 diabetes: °· Check your blood glucose 2 or more times a day. °· Also check your blood glucose: °? Before every insulin injection. °? Before and after exercise. °? Before meals. °? 2 hours after a meal. °? Occasionally between 2:00 a.m. and 3:00 a.m., as directed. °? Before potentially dangerous tasks, like driving or using heavy machinery. °?   At bedtime. °· You may need to check your blood glucose more often, up to 6-10 times a day, if you: °? Use an insulin pump. °? Need multiple daily injections (MDI). °? Have diabetes that is not well-controlled. °? Are ill. °? Have a history of severe hypoglycemia. °? Have hypoglycemia unawareness. °If you have type 2 diabetes: °· If you take insulin or other diabetes medicines, check your blood glucose 2 or  more times a day. °· If you are on intensive insulin therapy, check your blood glucose 4 or more times a day. Occasionally, you may also need to check between 2:00 a.m. and 3:00 a.m., as directed. °· Also check your blood glucose: °? Before and after exercise. °? Before potentially dangerous tasks, like driving or using heavy machinery. °· You may need to check your blood glucose more often if: °? Your medicine is being adjusted. °? Your diabetes is not well-controlled. °? You are ill. °General tips °· Always keep your supplies with you. °· If you have questions or need help, all blood glucose meters have a 24-hour "hotline" phone number that you can call. You may also contact your health care provider. °· After you use a few boxes of test strips, adjust (calibrate) your blood glucose meter by following instructions that came with your meter. °Contact a health care provider if: °· Your blood glucose is at or above 240 mg/dL (13.3 mmol/L) for 2 days in a row. °· You have been sick or have had a fever for 2 days or longer, and you are not getting better. °· You have any of the following problems for more than 6 hours: °? You cannot eat or drink. °? You have nausea or vomiting. °? You have diarrhea. °Get help right away if: °· Your blood glucose is lower than 54 mg/dL (3 mmol/L). °· You become confused or you have trouble thinking clearly. °· You have difficulty breathing. °· You have moderate or large ketone levels in your urine. °Summary °· Monitoring your blood sugar (glucose) is an important part of managing your diabetes (diabetes mellitus). °· Blood glucose monitoring involves checking your blood glucose as often as directed and keeping a record (log) of your results over time. °· Your health care provider will set individualized treatment goals for you. Your goals will be based on your age, other medical conditions you have, and how you respond to diabetes treatment. °· Every time you check your blood glucose,  write down your result. Also write down any notes about things that may be affecting your blood glucose, such as your diet and exercise for the day. °This information is not intended to replace advice given to you by your health care provider. Make sure you discuss any questions you have with your health care provider. °Document Released: 08/11/2003 Document Revised: 06/01/2018 Document Reviewed: 01/18/2016 °Elsevier Patient Education © 2020 [employer] ° °

## 2019-05-21 NOTE — Discharge Summary (Signed)
[facility] [place] NAME: Ac Colan    MR#:  [id number]  DATE OF BIRTH:  1956/06/07  DATE OF ADMISSION:  05/11/2019 ADMITTING PHYSICIAN: Demetrios Loll, MD  DATE OF DISCHARGE: 05/21/2019  1:40 PM  PRIMARY CARE PHYSICIAN: [name], MD    ADMISSION DIAGNOSIS:  Pulmonary fibrosis ([place]) [J84.10] Confusion [R41.0] Unintentional weight loss [R63.4] Type 2 diabetes mellitus with hyperosmolarity without nonketotic hyperglycemic-hyperosmolar coma [facility]) ([place]) [E11.00]  DISCHARGE DIAGNOSIS:  Active Problems:   Type 2 diabetes mellitus with hyperosmolar nonketotic hyperglycemia ([place])   SECONDARY DIAGNOSIS:   Past Medical History:  Diagnosis Date  . Alcohol use 03/16/2007   patient risk factors  . Allergic rhinitis, cause unspecified   . Altered mental status   . Anxiety state, unspecified   . Cervicalgia 09/17/2007  . COPD (chronic obstructive pulmonary disease) ([place])   . Diabetes mellitus without complication ([place])   . Diplopia 12/21/2007  . Esophageal reflux   . Gastritis 05/13/2009  . HCAP (healthcare-associated pneumonia) 08/12/2014  . Hematuria 03/16/2007  . Hyperthyroidism    graves disease  . Iodine hypothyroidism   . Memory loss   . NSIP (nonspecific interstitial pneumonia) ([place])    on azathioprine  . Osteoporosis, unspecified    bone density per Morivati in 2012  . Other abnormal glucose 04/08/2008  . Other diseases of lung, not elsewhere classified 01/23/2009   s/p Fleming/pulmonology consult; intolerant  to Advair  . Peripheral vascular disease, unspecified ([place]) 12/24/2009  . Personal history of colonic polyps   . Pneumonia, organism unspecified([place])   . Pure hypercholesterolemia   . Shortness of breath   . Stroke ([place]) 2005  . Tobacco use disorder 03/16/2007   patient risk factors  . Unspecified hypothyroidism 01/07/2010  . 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  Acute metabolic encephalopathy.  The patient is much improved from when he came in.  Patient was seen by neurology and neurosurgery.  The patient did not have an acute event on MRI or CT scan of the brain.  We lowered the dose of his chronic pain medication.  The patient's wife states that he does have an appointment today with his pain management doctor and he will review the recommendations.  She would like to keep the Valium as is.  Can restart Lipitor as outpatient.  Home health set up. 2.  Nonketotic hyperglycemia.  Patient was on insulin drip and switched over to low-dose Lantus.  Since patient sugars have come down quite a bit, the patient's wife and diabetes coordinator did recommend going over to oral medications.  Patient can go back on his Glucophage and will start Tradjenta 5 mg daily as outpatient.  Patient did have Lantus last night so these medications can be restarted tomorrow.  Prescription for glucometer, lancets, test strips and alcohol swabs. 3.  History of hemorrhagic CVA and previous VP shunt. 4.  Hyperlipidemia.  I held this with the patient's altered mental status can restart as outpatient. 5.  Essential hypertension history.  Blood pressure on the lower side.  Hold Cardizem CD. 6.  Chronic pain.  I decreased the dose of MS Contin down to 15 mg every 8 hours rather than the 60 mg that he takes at home.  The patient's wife has 60 mg prescription as outpatient.  She did not want me to write a prescription for the 15 mg stating that she has an appointment  with the pain management doctor today. 7.  Hypothyroidism unspecified on Armour Thyroid 8.  Chronic respiratory failure on oxygen with pulmonary fibrosis  DISCHARGE CONDITIONS:   Fair  CONSULTS OBTAINED:  Treatment Team:  Catarina Hartshorn, MD Deetta Perla, MD  DRUG ALLERGIES:   Allergies  Allergen Reactions  . Advair Diskus [Fluticasone-Salmeterol] Shortness Of Breath  . Erythromycin Nausea And Vomiting   . Iodinated Diagnostic Agents Shortness Of Breath  . Ativan [Lorazepam] Other (See Comments)    Caused pt's heart to stop  . Darvocet [Propoxyphene N-Acetaminophen] Nausea And Vomiting  . Doxycycline Hyclate Nausea And Vomiting, Swelling and Other (See Comments)    Tongue swelling, severe depression  . Propoxyphene Nausea And Vomiting  . Septra [Sulfamethoxazole-Trimethoprim] Rash    DISCHARGE MEDICATIONS:   Allergies as of 05/21/2019      Reactions   Advair Diskus [fluticasone-salmeterol] Shortness Of Breath   Erythromycin Nausea And Vomiting   Iodinated Diagnostic Agents Shortness Of Breath   Ativan [lorazepam] Other (See Comments)   Caused pt's heart to stop   Darvocet [propoxyphene N-acetaminophen] Nausea And Vomiting   Doxycycline Hyclate Nausea And Vomiting, Swelling, Other (See Comments)   Tongue swelling, severe depression   Propoxyphene Nausea And Vomiting   Septra [sulfamethoxazole-trimethoprim] Rash      Medication List    STOP taking these medications   blood glucose meter kit and supplies Kit   diltiazem 120 MG 24 hr capsule Commonly known as: CARDIZEM CD   escitalopram 10 MG tablet Commonly known as: LEXAPRO   freestyle lancets   glucose blood test strip   omeprazole 40 MG capsule Commonly known as: PRILOSEC     TAKE these medications   albuterol 108 (90 Base) MCG/ACT inhaler Commonly known as: VENTOLIN HFA Inhale 2 puffs into the lungs every 6 (six) hours as needed for wheezing or shortness of breath.   atorvastatin 20 MG tablet Commonly known as: LIPITOR Take 1 tablet (20 mg total) by mouth daily at 6 PM.   diazepam 5 MG tablet Commonly known as: VALIUM Take 5 mg by mouth 2 (two) times daily.   Ensure Max Protein Liqd Take 330 mLs (11 oz total) by mouth 2 (two) times daily.   linagliptin 5 MG Tabs tablet Commonly known as: Tradjenta Take 1 tablet (5 mg total) by mouth daily.   metFORMIN 1000 MG tablet Commonly known as:  GLUCOPHAGE TAKE 1 TABLET BY MOUTH 2 TIMES DAILY WITH MEAL. What changed: See the new instructions.   morphine 15 MG 12 hr tablet Commonly known as: MS CONTIN Take 1 tablet (15 mg total) by mouth every 8 (eight) hours. What changed:   medication strength  how much to take   Nucynta 75 MG tablet Generic drug: tapentadol HCl Take 75 mg by mouth every 4 (four) hours as needed for moderate pain or severe pain.   polyethylene glycol 17 g packet Commonly known as: MIRALAX / GLYCOLAX Take 17 g by mouth daily.   predniSONE 10 MG tablet Commonly known as: DELTASONE Take 20 mg by mouth daily with breakfast.   predniSONE 10 MG tablet Commonly known as: DELTASONE Take 10 mg by mouth every evening.   thiamine 100 MG tablet Take 1 tablet (100 mg total) by mouth daily.   thyroid 90 MG tablet Commonly known as: ARMOUR Take 90 mg by mouth daily at 2 PM daily at 2 PM.   venlafaxine XR 150 MG 24 hr capsule Commonly known as: EFFEXOR-XR Take 1 capsule (150 mg total)  by mouth daily with breakfast.            Durable Medical Equipment  (From admission, onward)         Start     Ordered   05/20/19 1151  For home use only DME Walker rolling  Once    Question:  Patient needs a walker to treat with the following condition  Answer:  Unsteady gait   05/20/19 1150           DISCHARGE INSTRUCTIONS:   Follow-up PMD 5 days Has appointment with pain management doctor.  If you experience worsening of your admission symptoms, develop shortness of breath, life threatening emergency, suicidal or homicidal thoughts you must seek medical attention immediately by calling 911 or calling your MD immediately  if symptoms less severe.  You Must read complete instructions/literature along with all the possible adverse reactions/side effects for all the Medicines you take and that have been prescribed to you. Take any new Medicines after you have completely understood and accept all the possible  adverse reactions/side effects.   Please note  You were cared for by a hospitalist during your hospital stay. If you have any questions about your discharge medications or the care you received while you were in the hospital after you are discharged, you can call the unit and asked to speak with the hospitalist on call if the hospitalist that took care of you is not available. Once you are discharged, your primary care physician will handle any further medical issues. Please note that NO REFILLS for any discharge medications will be authorized once you are discharged, as it is imperative that you return to your primary care physician (or establish a relationship with a primary care physician if you do not have one) for your aftercare needs so that they can reassess your need for medications and monitor your lab values.    Today   CHIEF COMPLAINT:   Chief Complaint  Patient presents with  . Altered Mental Status    HISTORY OF PRESENT ILLNESS:  Nachum Boucher  is a 63 y.o. male presents with altered mental status.   VITAL SIGNS:  Blood pressure 109/72, pulse 78, temperature 97.9 F (36.6 C), temperature source Oral, resp. rate 19, height '5\' 9"'  (1.753 m), weight 65.6 kg, SpO2 100 %.  I/O:    Intake/Output Summary (Last 24 hours) at 05/21/2019 1355 Last data filed at 05/21/2019 1022 Gross per 24 hour  Intake 360 ml  Output -  Net 360 ml    PHYSICAL EXAMINATION:  GENERAL:  63 y.o.-year-old patient lying in the bed with no acute distress.  EYES: Pupils equal, round, reactive to light and accommodation. No scleral icterus. Extraocular muscles intact.  HEENT: Head atraumatic, normocephalic. Oropharynx and nasopharynx clear.  NECK:  Supple, no jugular venous distention. No thyroid enlargement, no tenderness.  LUNGS: Normal breath sounds bilaterally, no wheezing, rales,rhonchi or crepitation. No use of accessory muscles of respiration.  CARDIOVASCULAR: S1, S2 normal. No murmurs, rubs,  or gallops.  ABDOMEN: Soft, non-tender, non-distended. Bowel sounds present. No organomegaly or mass.  EXTREMITIES: No pedal edema, cyanosis, or clubbing.  NEUROLOGIC: Cranial nerves II through XII are intact. Muscle strength 5/5 in all extremities. Sensation intact. Gait not checked.  PSYCHIATRIC: The patient is alert and oriented x 3.  SKIN: No obvious rash, lesion, or ulcer.   DATA REVIEW:   CBC Recent Labs  Lab 05/19/19 0507  WBC 7.3  HGB 11.7*  HCT 32.7*  PLT 357  Chemistries  Recent Labs  Lab 05/21/19 0518  NA 142  K 4.2  CL 104  CO2 26  GLUCOSE 87  BUN 12  CREATININE 0.95  CALCIUM 9.2  MG 2.1     Microbiology Results  Results for orders placed or performed during the hospital encounter of 05/11/19  SARS CORONAVIRUS 2 (TAT 6-24 HRS) Nasopharyngeal Nasopharyngeal Swab     Status: None   Collection Time: 05/11/19 12:56 PM   Specimen: Nasopharyngeal Swab  Result Value Ref Range Status   SARS Coronavirus 2 NEGATIVE NEGATIVE Final    Comment: (NOTE) SARS-CoV-2 target nucleic acids are NOT DETECTED. The SARS-CoV-2 RNA is generally detectable in upper and lower respiratory specimens during the acute phase of infection. Negative results do not preclude SARS-CoV-2 infection, do not rule out co-infections with other pathogens, and should not be used as the sole basis for treatment or other patient management decisions. Negative results must be combined with clinical observations, patient history, and epidemiological information. The expected result is Negative. Fact Sheet for Patients: SugarRoll.be Fact Sheet for Healthcare Providers: https://www.woods-mathews.com/ This test is not yet approved or cleared by the [place] FDA and  has been authorized for detection and/or diagnosis of SARS-CoV-2 by FDA under an Emergency Use Authorization (EUA). This EUA will remain  in effect (meaning this test can be used) for the  duration of the COVID-19 declaration under Section 56 4(b)(1) of the Act, 21 U.S.C. section 360bbb-3(b)(1), unless the authorization is terminated or revoked sooner. Performed at [facility], [place] [address] [place], [place] [address]   MRSA PCR Screening     Status: None   Collection Time: 05/11/19  4:55 PM   Specimen: Nasopharyngeal  Result Value Ref Range Status   MRSA by PCR NEGATIVE NEGATIVE Final    Comment:        The GeneXpert MRSA Assay (FDA approved for NASAL specimens only), is one component of a comprehensive MRSA colonization surveillance program. It is not intended to diagnose MRSA infection nor to guide or monitor treatment for MRSA infections. Performed at [facility], [address], [place], [place] [address]        Management plans discussed with the patient, family and they are in agreement.  CODE STATUS:     Code Status Orders  (From admission, onward)         Start     Ordered   05/11/19 1650  Do not attempt resuscitation (DNR)  Continuous    Question Answer Comment  In the event of cardiac or respiratory ARREST Do not call a "code blue"   In the event of cardiac or respiratory ARREST Do not perform Intubation, CPR, defibrillation or ACLS   In the event of cardiac or respiratory ARREST Use medication by any route, position, wound care, and other measures to relive pain and suffering. May use oxygen, suction and manual treatment of airway obstruction as needed for comfort.      05/11/19 1650        Code Status History    Date Active Date Inactive Code Status Order ID Comments User Context   03/14/2015 0306 03/20/2015 2217 Full Code [id number]  Lorna Few, DO Inpatient   08/12/2014 1735 08/14/2014 1800 Full Code [id number]  Nolon Rod, DO Inpatient   05/20/2014 1724 05/24/2014 1840 Full Code [id number]  Kristeen Miss, MD Inpatient   05/18/2014 0802 05/20/2014 1724 Full Code [id number]  Kristeen Miss, MD Inpatient    05/11/2014 1729  05/15/2014 1638 Full Code [id number]  Donita Brooks, NP Inpatient   Advance Care Planning Activity      TOTAL TIME TAKING CARE OF THIS PATIENT: 35 minutes.    Loletha Grayer M.D on 05/21/2019 at 1:55 PM  Between 7am to 6pm - Pager - [phone]  After 6pm go to [email or web address] - password [email or web address]  [facility] [employer] Office  [phone]  CC: Primary care physician; [name], MD

## 2019-05-27 ENCOUNTER — Telehealth: Payer: Self-pay

## 2019-06-04 ENCOUNTER — Inpatient Hospital Stay: Payer: 59 | Admitting: Registered Nurse

## 2019-06-28 ENCOUNTER — Ambulatory Visit: Admission: RE | Admit: 2019-06-28 | Payer: BC Managed Care – PPO | Source: Ambulatory Visit

## 2019-10-01 NOTE — Telephone Encounter (Signed)
Entered in error

## 2019-12-26 DIAGNOSIS — G2581 Restless legs syndrome: Secondary | ICD-10-CM | POA: Insufficient documentation

## 2019-12-26 DIAGNOSIS — J432 Centrilobular emphysema: Secondary | ICD-10-CM | POA: Insufficient documentation

## 2020-02-03 ENCOUNTER — Other Ambulatory Visit (INDEPENDENT_AMBULATORY_CARE_PROVIDER_SITE_OTHER): Payer: Self-pay

## 2020-02-03 ENCOUNTER — Telehealth (INDEPENDENT_AMBULATORY_CARE_PROVIDER_SITE_OTHER): Payer: Self-pay | Admitting: Gastroenterology

## 2020-02-03 DIAGNOSIS — Z1211 Encounter for screening for malignant neoplasm of colon: Secondary | ICD-10-CM

## 2020-02-03 NOTE — Progress Notes (Signed)
Gastroenterology Pre-Procedure Review  Request Date: Friday 03/06/20 Requesting Physician: Dr. Vicente Males  PATIENT REVIEW QUESTIONS: The patient's wife responded to the following health history questions as indicated:    1. Are you having any GI issues? yes (heartburn, reflux takes Famotidine and Omeprazole) 2. Do you have a personal history of Polyps? yes (Patients wife states her husband had the last colonoscopy about 5-7 years ago, however I did not see a record of this.  Referral came for dx: Screening colonoscopy.  This is the code I used for the procedure order.) 3. Do you have a family history of Colon Cancer or Polyps? brother had pancreatic cancer 4. Diabetes Mellitus? yes (takes oral meds) 5. Joint replacements in the past 12 months?no 6. Major health problems in the past 3 months?no 7. Any artificial heart valves, MVP, or defibrillator?no    MEDICATIONS & ALLERGIES:    Patient reports the following regarding taking any anticoagulation/antiplatelet therapy:   Plavix, Coumadin, Eliquis, Xarelto, Lovenox, Pradaxa, Brilinta, or Effient? no Aspirin? no  Patient confirms/reports the following medications:  Current Outpatient Medications  Medication Sig Dispense Refill  . albuterol (VENTOLIN HFA) 108 (90 Base) MCG/ACT inhaler Inhale 2 puffs into the lungs every 6 (six) hours as needed for wheezing or shortness of breath. 18 g 0  . atorvastatin (LIPITOR) 20 MG tablet Take 1 tablet (20 mg total) by mouth daily at 6 PM. 90 tablet 3  . Blood Glucose Monitoring Suppl (ACCU-CHEK GUIDE) w/Device KIT See admin instructions.    . diazepam (VALIUM) 5 MG tablet Take 5 mg by mouth 2 (two) times daily.     . ESBRIET 267 MG TABS Take 3 tablets by mouth 3 (three) times daily as needed.    . famotidine (PEPCID) 20 MG tablet Take by mouth.    . metFORMIN (GLUCOPHAGE) 1000 MG tablet TAKE 1 TABLET BY MOUTH 2 TIMES DAILY WITH MEAL. (Patient taking differently: Take 1,000 mg by mouth 2 (two) times daily with a  meal. ) 180 tablet 0  . mirtazapine (REMERON) 15 MG tablet Take by mouth.    . morphine (MS CONTIN) 15 MG 12 hr tablet Take 1 tablet (15 mg total) by mouth every 8 (eight) hours.  0  . omeprazole (PRILOSEC) 20 MG capsule Take by mouth.    . predniSONE (DELTASONE) 10 MG tablet Take 20 mg by mouth daily with breakfast.     . predniSONE (DELTASONE) 10 MG tablet Take 10 mg by mouth every evening.     Marland Kitchen rOPINIRole (REQUIP) 0.5 MG tablet Take by mouth.    . sitaGLIPtin (JANUVIA) 100 MG tablet Take by mouth.    . thyroid (ARMOUR) 90 MG tablet Take 90 mg by mouth daily at 2 PM daily at 2 PM.     . venlafaxine XR (EFFEXOR-XR) 150 MG 24 hr capsule Take 1 capsule (150 mg total) by mouth daily with breakfast. 90 capsule 1  . CHANTIX 1 MG tablet Take 1 mg by mouth 2 (two) times daily. (Patient not taking: Reported on 02/03/2020)    . Ensure Max Protein (ENSURE MAX PROTEIN) LIQD Take 330 mLs (11 oz total) by mouth 2 (two) times daily. (Patient not taking: Reported on 02/03/2020) 330 mL 0  . linagliptin (TRADJENTA) 5 MG TABS tablet Take 1 tablet (5 mg total) by mouth daily. (Patient not taking: Reported on 02/03/2020) 30 tablet 0  . polyethylene glycol (MIRALAX / GLYCOLAX) 17 g packet Take 17 g by mouth daily. (Patient not taking: Reported on 02/03/2020)  30 each 0  . Tapentadol HCl (NUCYNTA) 75 MG TABS Take 75 mg by mouth every 4 (four) hours as needed for moderate pain or severe pain.  (Patient not taking: Reported on 02/03/2020)    . thiamine 100 MG tablet Take 1 tablet (100 mg total) by mouth daily. (Patient not taking: Reported on 02/03/2020) 30 tablet 0   No current facility-administered medications for this visit.    Patient confirms/reports the following allergies:  Allergies  Allergen Reactions  . Advair Diskus [Fluticasone-Salmeterol] Shortness Of Breath  . Erythromycin Nausea And Vomiting  . Iodinated Diagnostic Agents Shortness Of Breath and Other (See Comments)  . Ativan [Lorazepam] Other (See  Comments)    Caused pt's heart to stop  . Darvocet [Propoxyphene N-Acetaminophen] Nausea And Vomiting  . Doxycycline Hyclate Nausea And Vomiting, Swelling and Other (See Comments)    Tongue swelling, severe depression  . Propoxyphene Nausea And Vomiting  . Septra [Sulfamethoxazole-Trimethoprim] Rash    No orders of the defined types were placed in this encounter.   AUTHORIZATION INFORMATION Primary Insurance: 1D#: Group #:  Secondary Insurance: 1D#: Group #:  SCHEDULE INFORMATION: Date: Friday 03/06/20 Time: Location:[facility]

## 2020-03-04 ENCOUNTER — Other Ambulatory Visit
Admission: RE | Admit: 2020-03-04 | Discharge: 2020-03-04 | Disposition: A | Discharge status: Home / Self Care | Payer: BC Managed Care – PPO | Source: Ambulatory Visit | Attending: Gastroenterology | Admitting: Gastroenterology

## 2020-03-04 ENCOUNTER — Other Ambulatory Visit: Payer: Self-pay

## 2020-03-04 DIAGNOSIS — Z01812 Encounter for preprocedural laboratory examination: Secondary | ICD-10-CM | POA: Diagnosis present

## 2020-03-04 DIAGNOSIS — Z20822 Contact with and (suspected) exposure to covid-19: Secondary | ICD-10-CM | POA: Diagnosis not present

## 2020-03-04 LAB — SARS CORONAVIRUS 2 (TAT 6-24 HRS): SARS Coronavirus 2: NEGATIVE

## 2020-03-06 ENCOUNTER — Other Ambulatory Visit: Payer: Self-pay

## 2020-03-06 MED ORDER — NA SULFATE-K SULFATE-MG SULF 17.5-3.13-1.6 GM/177ML PO SOLN: 1.0000 | Freq: Once | ORAL | 0 refills | Status: AC

## 2020-03-09 ENCOUNTER — Encounter: Payer: Self-pay | Admitting: Gastroenterology

## 2020-03-10 ENCOUNTER — Encounter: Payer: Self-pay | Admitting: Anesthesiology

## 2020-03-10 ENCOUNTER — Other Ambulatory Visit: Payer: Self-pay

## 2020-03-10 ENCOUNTER — Ambulatory Visit
Admission: RE | Admit: 2020-03-10 | Discharge: 2020-03-10 | Disposition: A | Discharge status: Home / Self Care | Payer: BC Managed Care – PPO | Attending: Gastroenterology | Admitting: Gastroenterology

## 2020-03-10 ENCOUNTER — Encounter
Admission: RE | Disposition: A | Discharge status: Home / Self Care | Payer: Self-pay | Source: Home / Self Care | Attending: Gastroenterology

## 2020-03-10 DIAGNOSIS — Z539 Procedure and treatment not carried out, unspecified reason: Secondary | ICD-10-CM | POA: Diagnosis present

## 2020-03-10 HISTORY — PX: COLONOSCOPY WITH PROPOFOL: SHX5780

## 2020-03-10 SURGERY — COLONOSCOPY WITH PROPOFOL
Anesthesia: General

## 2020-03-10 NOTE — Anesthesia Preprocedure Evaluation (Deleted)
Anesthesia Evaluation  Patient identified by MRN, date of birth, ID band Patient awake    Reviewed: Allergy & Precautions, H&P , NPO status , Patient's Chart, lab work & pertinent test results  Airway        Dental   Pulmonary COPD, Current Smoker,  Pulmonary fibrosis          Cardiovascular + Peripheral Vascular Disease  + dysrhythmias Atrial Fibrillation      Neuro/Psych PSYCHIATRIC DISORDERS Anxiety Depression H/o VP shunt CVA    GI/Hepatic Neg liver ROS, GERD  ,  Endo/Other  diabetes  Renal/GU negative Renal ROS  negative genitourinary   Musculoskeletal   Abdominal   Peds  Hematology negative hematology ROS (+)   Anesthesia Other Findings Past Medical History: 03/16/2007: Alcohol use     Comment:  patient risk factors No date: Allergic rhinitis, cause unspecified No date: Altered mental status No date: Anxiety state, unspecified 09/17/2007: Cervicalgia No date: COPD (chronic obstructive pulmonary disease) ([facility]) No date: Diabetes mellitus without complication ([place]) 57/32/2025: Diplopia No date: Esophageal reflux 05/13/2009: Gastritis 08/12/2014: HCAP (healthcare-associated pneumonia) 03/16/2007: Hematuria No date: Hyperthyroidism     Comment:  graves disease No date: Iodine hypothyroidism No date: Memory loss No date: NSIP (nonspecific interstitial pneumonia) ([place])     Comment:  on azathioprine No date: Osteoporosis, unspecified     Comment:  bone density per Morivati in 2012 04/08/2008: Other abnormal glucose 01/23/2009: Other diseases of lung, not elsewhere classified     Comment:  s/p Fleming/pulmonology consult; intolerant  to Advair 12/24/2009: Peripheral vascular disease, unspecified ([place]) No date: Personal history of colonic polyps No date: Pneumonia, organism unspecified([place]) No date: Pure hypercholesterolemia No date: Shortness of breath 2005: Stroke ([place]) 03/16/2007: Tobacco use  disorder     Comment:  patient risk factors 01/07/2010: Unspecified hypothyroidism 06/08/2007: Unspecified late effects of cerebrovascular disease  Past Surgical History: No date: Admission 11/2011     Comment:  altered mental status/confusion with syncope.  CT head               negative, MRI brain negative, EEG: diffuse slowing but no              seizure acitivity.  Labs negative.  [facility]. 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 2010: EYE SURGERY 05/20/2014: LAPAROSCOPIC REVISION VENTRICULAR-PERITONEAL (V-P) SHUNT;  Right     Comment:  Procedure: [place]               (V-P) SHUNT;  Surgeon: Georganna Skeans, MD;  Location: [place]               NEURO ORS;  Service: General;  Laterality: Right; 04/2012: Neuropsychiatric evaluation     Comment:  poor short-term memory, poor recall, delayed reaction               time; permanently disabled.       Reproductive/Obstetrics negative OB ROS                             Anesthesia Physical Anesthesia Plan  ASA: III  Anesthesia Plan: General   Post-op Pain Management:    Induction:   PONV Risk Score and Plan: Propofol infusion and TIVA  Airway Management Planned: Nasal Cannula  Additional Equipment:   Intra-op Plan:   Post-operative Plan:   Informed Consent: I have reviewed the patients History and Physical, chart, labs and discussed the  procedure including the risks, benefits and alternatives for the proposed anesthesia with the patient or authorized representative who has indicated his/her understanding and acceptance.     Dental Advisory Given  Plan Discussed with: Anesthesiologist, CRNA and Surgeon  Anesthesia Plan Comments:         Anesthesia Quick Evaluation

## 2020-03-11 ENCOUNTER — Encounter: Payer: Self-pay | Admitting: Gastroenterology

## 2020-03-12 ENCOUNTER — Encounter: Admission: RE | Payer: Self-pay | Source: Home / Self Care

## 2020-03-12 ENCOUNTER — Ambulatory Visit
Admission: RE | Admit: 2020-03-12 | Payer: BC Managed Care – PPO | Source: Home / Self Care | Admitting: Gastroenterology

## 2020-03-12 SURGERY — COLONOSCOPY WITH PROPOFOL
Anesthesia: General

## 2020-03-12 NOTE — H&P (Signed)
It was cancelled

## 2020-04-10 ENCOUNTER — Other Ambulatory Visit: Payer: Self-pay | Admitting: Internal Medicine

## 2020-04-10 DIAGNOSIS — R0602 Shortness of breath: Secondary | ICD-10-CM

## 2020-04-10 DIAGNOSIS — I2510 Atherosclerotic heart disease of native coronary artery without angina pectoris: Secondary | ICD-10-CM

## 2020-04-10 DIAGNOSIS — I208 Other forms of angina pectoris: Secondary | ICD-10-CM

## 2020-04-13 ENCOUNTER — Other Ambulatory Visit: Payer: Self-pay | Admitting: Internal Medicine

## 2020-04-13 DIAGNOSIS — R0602 Shortness of breath: Secondary | ICD-10-CM

## 2020-04-20 ENCOUNTER — Ambulatory Visit
Admission: RE | Admit: 2020-04-20 | Discharge: 2020-04-20 | Disposition: A | Discharge status: Home / Self Care | Payer: BC Managed Care – PPO | Source: Ambulatory Visit | Attending: Internal Medicine | Admitting: Internal Medicine

## 2020-04-20 ENCOUNTER — Encounter
Admission: RE | Admit: 2020-04-20 | Discharge: 2020-04-20 | Disposition: A | Discharge status: Home / Self Care | Payer: BC Managed Care – PPO | Source: Ambulatory Visit | Attending: Internal Medicine | Admitting: Internal Medicine

## 2020-04-20 ENCOUNTER — Other Ambulatory Visit: Payer: Self-pay

## 2020-04-20 DIAGNOSIS — R0602 Shortness of breath: Secondary | ICD-10-CM

## 2020-04-20 DIAGNOSIS — J449 Chronic obstructive pulmonary disease, unspecified: Secondary | ICD-10-CM | POA: Diagnosis not present

## 2020-04-20 DIAGNOSIS — I25118 Atherosclerotic heart disease of native coronary artery with other forms of angina pectoris: Secondary | ICD-10-CM | POA: Diagnosis not present

## 2020-04-20 DIAGNOSIS — I208 Other forms of angina pectoris: Secondary | ICD-10-CM

## 2020-04-20 DIAGNOSIS — Z8673 Personal history of transient ischemic attack (TIA), and cerebral infarction without residual deficits: Secondary | ICD-10-CM | POA: Diagnosis not present

## 2020-04-20 DIAGNOSIS — I739 Peripheral vascular disease, unspecified: Secondary | ICD-10-CM | POA: Diagnosis not present

## 2020-04-20 DIAGNOSIS — Z8249 Family history of ischemic heart disease and other diseases of the circulatory system: Secondary | ICD-10-CM | POA: Insufficient documentation

## 2020-04-20 DIAGNOSIS — I2510 Atherosclerotic heart disease of native coronary artery without angina pectoris: Secondary | ICD-10-CM

## 2020-04-20 DIAGNOSIS — Z833 Family history of diabetes mellitus: Secondary | ICD-10-CM | POA: Insufficient documentation

## 2020-04-20 LAB — NM MYOCAR MULTI W/SPECT W/WALL MOTION / EF
Estimated workload: 1 METS
Exercise duration (min): 1 min
Exercise duration (sec): 3 s
LV dias vol: 79 mL (ref 62–150)
LV sys vol: 35 mL
Peak HR: 103 {beats}/min
Rest HR: 76 {beats}/min
SDS: 0
SRS: 1
SSS: 1
TID: 1.02

## 2020-04-20 LAB — ECHOCARDIOGRAM COMPLETE
AR max vel: 2.77 cm2
AV Area VTI: 2.9 cm2
AV Area mean vel: 2.59 cm2
AV Mean grad: 4 mmHg
AV Peak grad: 7.8 mmHg
Ao pk vel: 1.4 m/s
Area-P 1/2: 6.32 cm2
S' Lateral: 2.82 cm

## 2020-04-20 MED ORDER — TECHNETIUM TC 99M TETROFOSMIN IV KIT
29.5600 | PACK | Freq: Once | INTRAVENOUS | Status: AC | PRN
  Administered 2020-04-20: 29.56 via INTRAVENOUS

## 2020-04-20 MED ORDER — REGADENOSON 0.4 MG/5ML IV SOLN
0.4000 mg | Freq: Once | INTRAVENOUS | Status: AC
  Administered 2020-04-20: 0.4 mg via INTRAVENOUS

## 2020-04-20 MED ORDER — TECHNETIUM TC 99M TETROFOSMIN IV KIT
9.9200 | PACK | Freq: Once | INTRAVENOUS | Status: AC | PRN
  Administered 2020-04-20: 9.92 via INTRAVENOUS

## 2020-04-20 NOTE — Progress Notes (Signed)
*  PRELIMINARY RESULTS* Echocardiogram 2D Echocardiogram has been performed.  Kirk Robinson Kirk Robinson 04/20/2020, 10:48 AM

## 2020-05-04 ENCOUNTER — Other Ambulatory Visit: Payer: Self-pay | Admitting: Internal Medicine

## 2020-05-04 DIAGNOSIS — I739 Peripheral vascular disease, unspecified: Secondary | ICD-10-CM

## 2020-05-13 ENCOUNTER — Other Ambulatory Visit: Payer: Self-pay

## 2020-05-13 ENCOUNTER — Ambulatory Visit
Admission: RE | Admit: 2020-05-13 | Discharge: 2020-05-13 | Disposition: A | Discharge status: Home / Self Care | Payer: BC Managed Care – PPO | Source: Ambulatory Visit | Attending: Internal Medicine | Admitting: Internal Medicine

## 2020-05-13 DIAGNOSIS — I739 Peripheral vascular disease, unspecified: Secondary | ICD-10-CM

## 2020-06-02 ENCOUNTER — Encounter (INDEPENDENT_AMBULATORY_CARE_PROVIDER_SITE_OTHER): Payer: Self-pay | Admitting: Vascular Surgery

## 2020-06-02 ENCOUNTER — Other Ambulatory Visit: Payer: Self-pay

## 2020-06-02 ENCOUNTER — Ambulatory Visit (INDEPENDENT_AMBULATORY_CARE_PROVIDER_SITE_OTHER): Payer: BC Managed Care – PPO | Admitting: Vascular Surgery

## 2020-06-02 VITALS — BP 132/79 | HR 106 | Ht 69.0 in | Wt 165.0 lb

## 2020-06-02 DIAGNOSIS — E119 Type 2 diabetes mellitus without complications: Secondary | ICD-10-CM | POA: Diagnosis not present

## 2020-06-02 DIAGNOSIS — I70212 Atherosclerosis of native arteries of extremities with intermittent claudication, left leg: Secondary | ICD-10-CM | POA: Diagnosis not present

## 2020-06-02 DIAGNOSIS — E7800 Pure hypercholesterolemia, unspecified: Secondary | ICD-10-CM

## 2020-06-02 DIAGNOSIS — J411 Mucopurulent chronic bronchitis: Secondary | ICD-10-CM

## 2020-06-02 DIAGNOSIS — I70219 Atherosclerosis of native arteries of extremities with intermittent claudication, unspecified extremity: Secondary | ICD-10-CM | POA: Insufficient documentation

## 2020-06-02 NOTE — Assessment & Plan Note (Signed)
blood glucose control important in reducing the progression of atherosclerotic disease. Also, involved in wound healing. On appropriate medications.  

## 2020-06-02 NOTE — Assessment & Plan Note (Signed)
On chronic oxygen therapy.  Followed by pulmonology.

## 2020-06-02 NOTE — Progress Notes (Signed)
Patient ID: Kirk Robinson, male   DOB: May 30, 1956, 64 y.o.   MRN: [id number]  Chief Complaint  Patient presents with   New Patient (Initial Visit)    [place].  abnormal U/S    HPI Kirk Robinson is a 64 y.o. male.  I am asked to see the patient by Dr. Clayborn Bigness for evaluation of PAD with a recent abnormal ABI.  The patient has weakness and pain in his legs with activity.  This is more so on the left leg than the right.  He denies any open wounds or infection.  This has been a steadily worsening problem over several months.  He had a PAD check about 10 years ago which showed no disease.  He has multiple atherosclerotic risk factors.  His cardiologist recently performed ABIs which I have independently reviewed.  His right ABI was 1.04 with a left ABI of 0.87.  He has what appear to be biphasic waveforms a little more blunted on the left than the right.     Past Medical History:  Diagnosis Date   Alcohol use 03/16/2007   patient risk factors   Allergic rhinitis, cause unspecified    Altered mental status    Anxiety state, unspecified    Cervicalgia 09/17/2007   COPD (chronic obstructive pulmonary disease) (HCC)    Diabetes mellitus without complication ([place])    Diplopia 12/21/2007   Esophageal reflux    Gastritis 05/13/2009   HCAP (healthcare-associated pneumonia) 08/12/2014   Hematuria 03/16/2007   Hyperthyroidism    graves disease   Iodine hypothyroidism    Memory loss    NSIP (nonspecific interstitial pneumonia) ([place])    on azathioprine   Osteoporosis, unspecified    bone density per [place] in 2012   Other abnormal glucose 04/08/2008   Other diseases of lung, not elsewhere classified 01/23/2009   s/p [place]/pulmonology consult; intolerant  to Advair   Peripheral vascular disease, unspecified ([place]) 12/24/2009   Personal history of colonic polyps    Pneumonia, organism unspecified([place])    Pure hypercholesterolemia    Shortness of breath     Stroke ([place]) 2005   Tobacco use disorder 03/16/2007   patient risk factors   Unspecified hypothyroidism 01/07/2010   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  CT head negative, MRI brain negative, EEG: diffuse slowing but no  seizure acitivity.  Labs negative.  [facility].   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   COLONOSCOPY WITH PROPOFOL N/A 03/10/2020   Procedure: COLONOSCOPY WITH PROPOFOL;  Surgeon: Jonathon Bellows, MD;  Location: [facility] ENDOSCOPY;  Service: Gastroenterology;  Laterality: N/A;   EYE SURGERY  2010   LAPAROSCOPIC REVISION VENTRICULAR-PERITONEAL (V-P) SHUNT Right 05/20/2014   Procedure: LAPAROSCOPIC REVISION VENTRICULAR-PERITONEAL (V-P) SHUNT;  Surgeon: Georganna Skeans, MD;  Location: [facility];  Service: General;  Laterality: Right;   Neuropsychiatric evaluation  04/2012   poor short-term memory, poor recall, delayed reaction time; permanently disabled.       Family History  Problem Relation Age of Onset   Heart disease Father        cad, chf   Cancer Father    Heart disease Brother 91       CABG   Cancer Brother 5       pancreatic cancer   Diabetes Brother    Diabetes Brother  Heart disease Brother 53       CABG   Diabetes Brother    Heart disease Brother 91       CABG   Cancer Brother 2       prostate cancer   Heart disease Mother        cad   Diabetes Mother    Stroke Mother      Social History   Tobacco Use   Smoking status: Current Every Day Smoker    Packs/day: 1.00    Years: 30.00    Pack years: 30.00   Smokeless tobacco: Never Used  Substance Use Topics   Alcohol use: No   Drug use: No    Allergies  Allergen Reactions   Advair Diskus [Fluticasone-Salmeterol] Shortness Of Breath   Erythromycin Nausea And Vomiting   Iodinated Diagnostic Agents Shortness Of  Breath and Other (See Comments)   Ativan [Lorazepam] Other (See Comments)    Caused pt's heart to stop   Darvocet [Propoxyphene N-Acetaminophen] Nausea And Vomiting   Doxycycline Hyclate Nausea And Vomiting, Swelling and Other (See Comments)    Tongue swelling, severe depression   Propoxyphene Nausea And Vomiting   Septra [Sulfamethoxazole-Trimethoprim] Rash    Current Outpatient Medications  Medication Sig Dispense Refill   albuterol (VENTOLIN HFA) 108 (90 Base) MCG/ACT inhaler Inhale 2 puffs into the lungs every 6 (six) hours as needed for wheezing or shortness of breath. 18 g 0   alendronate (FOSAMAX) 70 MG tablet Take by mouth.     atorvastatin (LIPITOR) 20 MG tablet Take 1 tablet (20 mg total) by mouth daily at 6 PM. 90 tablet 3   Blood Glucose Monitoring Suppl (ACCU-CHEK GUIDE) w/Device KIT See admin instructions.     CHANTIX 1 MG tablet Take 1 mg by mouth 2 (two) times daily.      Cholecalciferol 1.25 MG (50000 UT) capsule Take by mouth.     diazepam (VALIUM) 5 MG tablet Take 5 mg by mouth 2 (two) times daily.      diltiazem (CARDIZEM CD) 120 MG 24 hr capsule Take by mouth daily.     Ensure Max Protein (ENSURE MAX PROTEIN) LIQD Take 330 mLs (11 oz total) by mouth 2 (two) times daily. 330 mL 0   ESBRIET 267 MG TABS Take 3 tablets by mouth 3 (three) times daily as needed.     linagliptin (TRADJENTA) 5 MG TABS tablet Take 1 tablet (5 mg total) by mouth daily. 30 tablet 0   metFORMIN (GLUCOPHAGE) 1000 MG tablet TAKE 1 TABLET BY MOUTH 2 TIMES DAILY WITH MEAL. (Patient taking differently: Take 1,000 mg by mouth 2 (two) times daily with a meal. ) 180 tablet 0   metoprolol succinate (TOPROL-XL) 25 MG 24 hr tablet Take by mouth.     mirtazapine (REMERON) 15 MG tablet Take by mouth.     morphine (MS CONTIN) 15 MG 12 hr tablet Take 1 tablet (15 mg total) by mouth every 8 (eight) hours.  0   omeprazole (PRILOSEC) 20 MG capsule Take by mouth.     polyethylene glycol  (MIRALAX / GLYCOLAX) 17 g packet Take 17 g by mouth daily. 30 each 0   predniSONE (DELTASONE) 10 MG tablet Take 20 mg by mouth daily with breakfast.      predniSONE (DELTASONE) 10 MG tablet Take 10 mg by mouth every evening.      rOPINIRole (REQUIP) 0.5 MG tablet Take by mouth.     Tapentadol HCl (NUCYNTA) 75 MG TABS  Take 75 mg by mouth every 4 (four) hours as needed for moderate pain or severe pain.      thiamine 100 MG tablet Take 1 tablet (100 mg total) by mouth daily. 30 tablet 0   thyroid (ARMOUR) 90 MG tablet Take 90 mg by mouth daily at 2 PM daily at 2 PM.      venlafaxine XR (EFFEXOR-XR) 150 MG 24 hr capsule Take 1 capsule (150 mg total) by mouth daily with breakfast. 90 capsule 1   famotidine (PEPCID) 20 MG tablet Take by mouth.     sitaGLIPtin (JANUVIA) 100 MG tablet Take by mouth.     No current facility-administered medications for this visit.      REVIEW OF SYSTEMS (Negative unless checked)  Constitutional: '[]' Weight loss  '[]' Fever  '[]' Chills Cardiac: '[]' Chest pain   '[]' Chest pressure   '[]' Palpitations   '[]' Shortness of breath when laying flat   '[]' Shortness of breath at rest   '[]' Shortness of breath with exertion. Vascular:  '[x]' Pain in legs with walking   '[]' Pain in legs at rest   '[]' Pain in legs when laying flat   '[x]' Claudication   '[]' Pain in feet when walking  '[]' Pain in feet at rest  '[]' Pain in feet when laying flat   '[]' History of DVT   '[]' Phlebitis   '[]' Swelling in legs   '[]' Varicose veins   '[]' Non-healing ulcers Pulmonary:   '[x]' Uses home oxygen   '[]' Productive cough   '[]' Hemoptysis   '[]' Wheeze  '[x]' COPD   '[]' Asthma Neurologic:  '[]' Dizziness  '[]' Blackouts   '[]' Seizures   '[x]' History of stroke   '[]' History of TIA  '[]' Aphasia   '[]' Temporary blindness   '[]' Dysphagia   '[]' Weakness or numbness in arms   '[]' Weakness or numbness in legs Musculoskeletal:  '[x]' Arthritis   '[]' Joint swelling   '[x]' Joint pain   '[]' Low back pain Hematologic:  '[]' Easy bruising  '[]' Easy bleeding   '[]' Hypercoagulable state   '[]' Anemic   '[]' Hepatitis Gastrointestinal:  '[]' Blood in stool   '[]' Vomiting blood  '[]' Gastroesophageal reflux/heartburn   '[]' Abdominal pain Genitourinary:  '[]' Chronic kidney disease   '[]' Difficult urination  '[]' Frequent urination  '[]' Burning with urination   '[]' Hematuria Skin:  '[]' Rashes   '[]' Ulcers   '[]' Wounds Psychological:  '[]' History of anxiety   '[]'  History of major depression.    Physical Exam BP 132/79    Pulse (!) 106    Ht '5\' 9"'  (1.753 m)    Wt 165 lb (74.8 kg)    BMI 24.37 kg/m  Gen:  WD/WN, NAD.  Appears older than stated age Head: [place]/AT, No temporalis wasting. Ear/Nose/Throat: Hearing grossly intact, nares w/o erythema or drainage, oropharynx w/o Erythema/Exudate Eyes: Conjunctiva clear, sclera non-icteric  Neck: trachea midline.  No JVD.  Pulmonary:  Good air movement, respirations not labored on supplemental oxygen Cardiac: Tachycardic Vascular:  Vessel Right Left  Radial Palpable Palpable                          DP  2+  2+  PT  2+  1+   Gastrointestinal:. No masses, surgical incisions, or scars. Musculoskeletal: M/S 5/5 throughout.  Extremities without ischemic changes.  No deformity or atrophy.  No significant lower extremity edema. Neurologic: Sensation grossly intact in extremities.  Symmetrical.  Speech is fluent. Motor exam as listed above. Psychiatric: Judgment intact, Mood & affect appropriate for pt's clinical situation. Dermatologic: No rashes or ulcers noted.  No cellulitis or open wounds.    Radiology US ARTERIAL ABI (SCREENING LOWER EXTREMITY)  Result Date: 05/13/2020 CLINICAL DATA:  Peripheral vascular disease EXAM: NONINVASIVE PHYSIOLOGIC VASCULAR STUDY OF BILATERAL LOWER EXTREMITIES TECHNIQUE: Evaluation of both lower extremities were performed at rest, including calculation of ankle-brachial indices with single level Doppler, pressure and pulse volume recording. COMPARISON:  None. FINDINGS: Right ABI:  1.04 Left ABI:  0.87 Right Lower Extremity:  Normal arterial  waveforms at the ankle. Left Lower Extremity:  Normal arterial waveforms at the ankle. IMPRESSION: 1. Mild left lower extremity arterial occlusive disease at rest. 2. Normal right ABI Electronically Signed   By: Lucrezia Europe M.D.   On: 05/13/2020 16:19    Labs Recent Results (from the past 2160 hour(s))  SARS CORONAVIRUS 2 (TAT 6-24 HRS) Nasopharyngeal Nasopharyngeal Swab     Status: None   Collection Time: 03/04/20 10:57 AM   Specimen: Nasopharyngeal Swab  Result Value Ref Range   SARS Coronavirus 2 NEGATIVE NEGATIVE    Comment: (NOTE) SARS-CoV-2 target nucleic acids are NOT DETECTED.  The SARS-CoV-2 RNA is generally detectable in upper and lower respiratory specimens during the acute phase of infection. Negative results do not preclude SARS-CoV-2 infection, do not rule out co-infections with other pathogens, and should not be used as the sole basis for treatment or other patient management decisions. Negative results must be combined with clinical observations, patient history, and epidemiological information. The expected result is Negative.  Fact Sheet for Patients: SugarRoll.be  Fact Sheet for Healthcare Providers: https://www.woods-mathews.com/  This test is not yet approved or cleared by the [place] FDA and  has been authorized for detection and/or diagnosis of SARS-CoV-2 by FDA under an Emergency Use Authorization (EUA). This EUA will remain  in effect (meaning this test can be used) for the duration of the COVID-19 declaration under Se ction 564(b)(1) of the Act, 21 U.S.C. section 360bbb-3(b)(1), unless the authorization is terminated or revoked sooner.  Performed at [facility], [place] [address] [place], [place] [address]   ECHOCARDIOGRAM COMPLETE     Status: None   Collection Time: 04/20/20 10:48 AM  Result Value Ref Range   Ao pk vel 1.40 m/s   AV Area VTI 2.90 cm2   AR max vel 2.77 cm2   AV Mean grad 4.0 mmHg     AV Peak grad 7.8 mmHg   S' Lateral 2.82 cm   AV Area mean vel 2.59 cm2   Area-P 1/2 6.32 cm2  NM Myocar Multi W/Spect W/Wall Motion / EF     Status: None (Preliminary result)   Collection Time: 04/20/20 12:48 PM  Result Value Ref Range   Rest HR 76 bpm   Rest BP 110/69 mmHg   Exercise duration (sec) 3 sec   Exercise duration (min) 1 min   Estimated workload 1.0 METS   Peak HR 103 bpm   Peak BP 110/69 mmHg   SSS 1    SRS 1    SDS 0    TID 1.02    LV sys vol 35 mL   LV dias vol 79 62 - 150 mL    Assessment/Plan:  COPD (chronic obstructive pulmonary disease) On chronic oxygen therapy.  Followed by pulmonology.  Type 2 diabetes mellitus without complication, without long-term current use of insulin (HCC) blood glucose control important in reducing the progression of atherosclerotic disease. Also, involved in wound healing. On appropriate medications.   Hyperlipidemia lipid control important in reducing the progression of atherosclerotic disease. Continue statin therapy   Atherosclerosis of native arteries of extremity with intermittent  claudication [facility]) His cardiologist recently performed ABIs which I have independently reviewed.  His right ABI was 1.04 with a left ABI of 0.87.  He has what appear to be biphasic waveforms a little more blunted on the left than the right.   We had a long discussion today about peripheral arterial disease and why and how it causes symptoms.  Some of his symptoms sound like they may be neuropathic or musculoskeletal, but his more severe leg does correlate with the leg with reduced ABI.  His ABIs are fairly good which may be indicative of inflow disease where the ABIs are often preserved at rest.  This would also go along with his symptoms.  He is very unsure in having anything done that might help his legs gain strength and have less pain.  We discussed an angiogram with possible revascularization was a reasonable option.  We discussed the risks and  benefits of this procedure.  The patient would like to have this done and this would be scheduled in the near future at his convenience.      [name] 06/02/2020, 10:36 AM   This note was created with Dragon medical transcription system.  Any errors from dictation are unintentional.

## 2020-06-02 NOTE — Assessment & Plan Note (Signed)
His cardiologist recently performed ABIs which I have independently reviewed.  His right ABI was 1.04 with a left ABI of 0.87.  He has what appear to be biphasic waveforms a little more blunted on the left than the right.   We had a long discussion today about peripheral arterial disease and why and how it causes symptoms.  Some of his symptoms sound like they may be neuropathic or musculoskeletal, but his more severe leg does correlate with the leg with reduced ABI.  His ABIs are fairly good which may be indicative of inflow disease where the ABIs are often preserved at rest.  This would also go along with his symptoms.  He is very unsure in having anything done that might help his legs gain strength and have less pain.  We discussed an angiogram with possible revascularization was a reasonable option.  We discussed the risks and benefits of this procedure.  The patient would like to have this done and this would be scheduled in the near future at his convenience.

## 2020-06-02 NOTE — Patient Instructions (Signed)
Peripheral Vascular Disease  Peripheral vascular disease (PVD) is a disease of the blood vessels that are not part of your heart and brain. A simple term for PVD is poor circulation. In most cases, PVD narrows the blood vessels that carry blood from your heart to the rest of your body. This can reduce the supply of blood to your arms, legs, and internal organs, like your stomach or kidneys. However, PVD most often affects a person's lower legs and feet. Without treatment, PVD tends to get worse. PVD can also lead to acute ischemic limb. This is when an arm or leg suddenly cannot get enough blood. This is a medical emergency. Follow these instructions at home: Lifestyle  Do not use any products that contain nicotine or tobacco, such as cigarettes and e-cigarettes. If you need help quitting, ask your doctor.  Lose weight if you are overweight. Or, stay at a healthy weight as told by your doctor.  Eat a diet that is low in fat and cholesterol. If you need help, ask your doctor.  Exercise regularly. Ask your doctor for activities that are right for you. General instructions  Take over-the-counter and prescription medicines only as told by your doctor.  Take good care of your feet: ? Wear comfortable shoes that fit well. ? Check your feet often for any cuts or sores.  Keep all follow-up visits as told by your doctor This is important. Contact a doctor if:  You have cramps in your legs when you walk.  You have leg pain when you are at rest.  You have coldness in a leg or foot.  Your skin changes.  You are unable to get or have an erection (erectile dysfunction).  You have cuts or sores on your feet that do not heal. Get help right away if:  Your arm or leg turns cold, numb, and blue.  Your arms or legs become red, warm, swollen, painful, or numb.  You have chest pain.  You have trouble breathing.  You suddenly have weakness in your face, arm, or leg.  You become very  confused or you cannot speak.  You suddenly have a very bad headache.  You suddenly cannot see. Summary  Peripheral vascular disease (PVD) is a disease of the blood vessels.  A simple term for PVD is poor circulation. Without treatment, PVD tends to get worse.  Treatment may include exercise, low fat and low cholesterol diet, and quitting smoking. This information is not intended to replace advice given to you by your health care provider. Make sure you discuss any questions you have with your health care provider. Document Revised: 07/21/2017 Document Reviewed: 09/15/2016 Elsevier Patient Education  2020 [employer]  

## 2020-06-02 NOTE — Assessment & Plan Note (Signed)
lipid control important in reducing the progression of atherosclerotic disease. Continue statin therapy  

## 2020-06-02 NOTE — H&P (View-Only) (Signed)
Patient ID: Kirk Robinson, male   DOB: 1955-11-13, 64 y.o.   MRN: [id number]  Chief Complaint  Patient presents with  . New Patient (Initial Visit)    [place].  abnormal U/S    HPI Kirk Robinson is a 64 y.o. male.  I am asked to see the patient by Dr. Clayborn Bigness for evaluation of PAD with a recent abnormal ABI.  The patient has weakness and pain in his legs with activity.  This is more so on the left leg than the right.  He denies any open wounds or infection.  This has been a steadily worsening problem over several months.  He had a PAD check about 10 years ago which showed no disease.  He has multiple atherosclerotic risk factors.  His cardiologist recently performed ABIs which I have independently reviewed.  His right ABI was 1.04 with a left ABI of 0.87.  He has what appear to be biphasic waveforms a little more blunted on the left than the right.     Past Medical History:  Diagnosis Date  . Alcohol use 03/16/2007   patient risk factors  . Allergic rhinitis, cause unspecified   . Altered mental status   . Anxiety state, unspecified   . Cervicalgia 09/17/2007  . COPD (chronic obstructive pulmonary disease) ([place])   . Diabetes mellitus without complication ([place])   . Diplopia 12/21/2007  . Esophageal reflux   . Gastritis 05/13/2009  . HCAP (healthcare-associated pneumonia) 08/12/2014  . Hematuria 03/16/2007  . Hyperthyroidism    graves disease  . Iodine hypothyroidism   . Memory loss   . NSIP (nonspecific interstitial pneumonia) ([place])    on azathioprine  . Osteoporosis, unspecified    bone density per [place] in 2012  . Other abnormal glucose 04/08/2008  . Other diseases of lung, not elsewhere classified 01/23/2009   s/p [place]/pulmonology consult; intolerant  to Advair  . Peripheral vascular disease, unspecified ([place]) 12/24/2009  . Personal history of colonic polyps   . Pneumonia, organism unspecified(486)   . Pure hypercholesterolemia   . Shortness of breath   .  Stroke ([place]) 2005  . Tobacco use disorder 03/16/2007   patient risk factors  . Unspecified hypothyroidism 01/07/2010  . 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 Admission 11/2011     altered mental status/confusion with syncope.  CT head negative, MRI brain negative, EEG: diffuse slowing but no  seizure acitivity.  Labs negative.  [facility].  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 COLONOSCOPY WITH PROPOFOL N/A 03/10/2020   Procedure: COLONOSCOPY WITH PROPOFOL;  Surgeon: Jonathon Bellows, MD;  Location: [facility] ENDOSCOPY;  Service: Gastroenterology;  Laterality: N/A;  . EYE SURGERY  2010  . LAPAROSCOPIC REVISION VENTRICULAR-PERITONEAL (V-P) SHUNT Right 05/20/2014   Procedure: LAPAROSCOPIC REVISION VENTRICULAR-PERITONEAL (V-P) SHUNT;  Surgeon: Georganna Skeans, MD;  Location: [facility];  Service: General;  Laterality: Right;  . Neuropsychiatric evaluation  04/2012   poor short-term memory, poor recall, delayed reaction time; permanently disabled.       Family History  Problem Relation Age of Onset  . Heart disease Father        cad, chf  . Cancer Father   . Heart disease Brother 26       CABG  . Cancer Brother 37       pancreatic cancer  . Diabetes Brother   . Diabetes Brother   .  Heart disease Brother 25       CABG  . Diabetes Brother   . Heart disease Brother 72       CABG  . Cancer Brother 70       prostate cancer  . Heart disease Mother        cad  . Diabetes Mother   . Stroke Mother      Social History   Tobacco Use  . Smoking status: Current Every Day Smoker    Packs/day: 1.00    Years: 30.00    Pack years: 30.00  . Smokeless tobacco: Never Used  Substance Use Topics  . Alcohol use: No  . Drug use: No    Allergies  Allergen Reactions  . Advair Diskus [Fluticasone-Salmeterol] Shortness Of Breath  . Erythromycin Nausea And Vomiting  . Iodinated Diagnostic Agents Shortness Of  Breath and Other (See Comments)  . Ativan [Lorazepam] Other (See Comments)    Caused pt's heart to stop  . Darvocet [Propoxyphene N-Acetaminophen] Nausea And Vomiting  . Doxycycline Hyclate Nausea And Vomiting, Swelling and Other (See Comments)    Tongue swelling, severe depression  . Propoxyphene Nausea And Vomiting  . Septra [Sulfamethoxazole-Trimethoprim] Rash    Current Outpatient Medications  Medication Sig Dispense Refill  . albuterol (VENTOLIN HFA) 108 (90 Base) MCG/ACT inhaler Inhale 2 puffs into the lungs every 6 (six) hours as needed for wheezing or shortness of breath. 18 g 0  . alendronate (FOSAMAX) 70 MG tablet Take by mouth.    Marland Kitchen atorvastatin (LIPITOR) 20 MG tablet Take 1 tablet (20 mg total) by mouth daily at 6 PM. 90 tablet 3  . Blood Glucose Monitoring Suppl (ACCU-CHEK GUIDE) w/Device KIT See admin instructions.    . CHANTIX 1 MG tablet Take 1 mg by mouth 2 (two) times daily.     . Cholecalciferol 1.25 MG (50000 UT) capsule Take by mouth.    . diazepam (VALIUM) 5 MG tablet Take 5 mg by mouth 2 (two) times daily.     Marland Kitchen diltiazem (CARDIZEM CD) 120 MG 24 hr capsule Take by mouth daily.    . Ensure Max Protein (ENSURE MAX PROTEIN) LIQD Take 330 mLs (11 oz total) by mouth 2 (two) times daily. 330 mL 0  . ESBRIET 267 MG TABS Take 3 tablets by mouth 3 (three) times daily as needed.    . linagliptin (TRADJENTA) 5 MG TABS tablet Take 1 tablet (5 mg total) by mouth daily. 30 tablet 0  . metFORMIN (GLUCOPHAGE) 1000 MG tablet TAKE 1 TABLET BY MOUTH 2 TIMES DAILY WITH MEAL. (Patient taking differently: Take 1,000 mg by mouth 2 (two) times daily with a meal. ) 180 tablet 0  . metoprolol succinate (TOPROL-XL) 25 MG 24 hr tablet Take by mouth.    . mirtazapine (REMERON) 15 MG tablet Take by mouth.    . morphine (MS CONTIN) 15 MG 12 hr tablet Take 1 tablet (15 mg total) by mouth every 8 (eight) hours.  0  . omeprazole (PRILOSEC) 20 MG capsule Take by mouth.    . polyethylene glycol  (MIRALAX / GLYCOLAX) 17 g packet Take 17 g by mouth daily. 30 each 0  . predniSONE (DELTASONE) 10 MG tablet Take 20 mg by mouth daily with breakfast.     . predniSONE (DELTASONE) 10 MG tablet Take 10 mg by mouth every evening.     Marland Kitchen rOPINIRole (REQUIP) 0.5 MG tablet Take by mouth.    . Tapentadol HCl (NUCYNTA) 75 MG TABS  Take 75 mg by mouth every 4 (four) hours as needed for moderate pain or severe pain.     Marland Kitchen thiamine 100 MG tablet Take 1 tablet (100 mg total) by mouth daily. 30 tablet 0  . thyroid (ARMOUR) 90 MG tablet Take 90 mg by mouth daily at 2 PM daily at 2 PM.     . venlafaxine XR (EFFEXOR-XR) 150 MG 24 hr capsule Take 1 capsule (150 mg total) by mouth daily with breakfast. 90 capsule 1  . famotidine (PEPCID) 20 MG tablet Take by mouth.    . sitaGLIPtin (JANUVIA) 100 MG tablet Take by mouth.     No current facility-administered medications for this visit.      REVIEW OF SYSTEMS (Negative unless checked)  Constitutional: '[]' Weight loss  '[]' Fever  '[]' Chills Cardiac: '[]' Chest pain   '[]' Chest pressure   '[]' Palpitations   '[]' Shortness of breath when laying flat   '[]' Shortness of breath at rest   '[]' Shortness of breath with exertion. Vascular:  '[x]' Pain in legs with walking   '[]' Pain in legs at rest   '[]' Pain in legs when laying flat   '[x]' Claudication   '[]' Pain in feet when walking  '[]' Pain in feet at rest  '[]' Pain in feet when laying flat   '[]' History of DVT   '[]' Phlebitis   '[]' Swelling in legs   '[]' Varicose veins   '[]' Non-healing ulcers Pulmonary:   '[x]' Uses home oxygen   '[]' Productive cough   '[]' Hemoptysis   '[]' Wheeze  '[x]' COPD   '[]' Asthma Neurologic:  '[]' Dizziness  '[]' Blackouts   '[]' Seizures   '[x]' History of stroke   '[]' History of TIA  '[]' Aphasia   '[]' Temporary blindness   '[]' Dysphagia   '[]' Weakness or numbness in arms   '[]' Weakness or numbness in legs Musculoskeletal:  '[x]' Arthritis   '[]' Joint swelling   '[x]' Joint pain   '[]' Low back pain Hematologic:  '[]' Easy bruising  '[]' Easy bleeding   '[]' Hypercoagulable state   '[]' Anemic   '[]' Hepatitis Gastrointestinal:  '[]' Blood in stool   '[]' Vomiting blood  '[]' Gastroesophageal reflux/heartburn   '[]' Abdominal pain Genitourinary:  '[]' Chronic kidney disease   '[]' Difficult urination  '[]' Frequent urination  '[]' Burning with urination   '[]' Hematuria Skin:  '[]' Rashes   '[]' Ulcers   '[]' Wounds Psychological:  '[]' History of anxiety   '[]'  History of major depression.    Physical Exam BP 132/79   Pulse (!) 106   Ht '5\' 9"'  (1.753 m)   Wt 165 lb (74.8 kg)   BMI 24.37 kg/m  Gen:  WD/WN, NAD.  Appears older than stated age Head: [place]/AT, No temporalis wasting. Ear/Nose/Throat: Hearing grossly intact, nares w/o erythema or drainage, oropharynx w/o Erythema/Exudate Eyes: Conjunctiva clear, sclera non-icteric  Neck: trachea midline.  No JVD.  Pulmonary:  Good air movement, respirations not labored on supplemental oxygen Cardiac: Tachycardic Vascular:  Vessel Right Left  Radial Palpable Palpable                          DP  2+  2+  PT  2+  1+   Gastrointestinal:. No masses, surgical incisions, or scars. Musculoskeletal: M/S 5/5 throughout.  Extremities without ischemic changes.  No deformity or atrophy.  No significant lower extremity edema. Neurologic: Sensation grossly intact in extremities.  Symmetrical.  Speech is fluent. Motor exam as listed above. Psychiatric: Judgment intact, Mood & affect appropriate for pt's clinical situation. Dermatologic: No rashes or ulcers noted.  No cellulitis or open wounds.    Radiology US ARTERIAL ABI (SCREENING LOWER EXTREMITY)  Result Date: 05/13/2020 CLINICAL  DATA:  Peripheral vascular disease EXAM: NONINVASIVE PHYSIOLOGIC VASCULAR STUDY OF BILATERAL LOWER EXTREMITIES TECHNIQUE: Evaluation of both lower extremities were performed at rest, including calculation of ankle-brachial indices with single level Doppler, pressure and pulse volume recording. COMPARISON:  None. FINDINGS: Right ABI:  1.04 Left ABI:  0.87 Right Lower Extremity:  Normal arterial  waveforms at the ankle. Left Lower Extremity:  Normal arterial waveforms at the ankle. IMPRESSION: 1. Mild left lower extremity arterial occlusive disease at rest. 2. Normal right ABI Electronically Signed   By: Lucrezia Europe M.D.   On: 05/13/2020 16:19    Labs Recent Results (from the past 2160 hour(s))  SARS CORONAVIRUS 2 (TAT 6-24 HRS) Nasopharyngeal Nasopharyngeal Swab     Status: None   Collection Time: 03/04/20 10:57 AM   Specimen: Nasopharyngeal Swab  Result Value Ref Range   SARS Coronavirus 2 NEGATIVE NEGATIVE    Comment: (NOTE) SARS-CoV-2 target nucleic acids are NOT DETECTED.  The SARS-CoV-2 RNA is generally detectable in upper and lower respiratory specimens during the acute phase of infection. Negative results do not preclude SARS-CoV-2 infection, do not rule out co-infections with other pathogens, and should not be used as the sole basis for treatment or other patient management decisions. Negative results must be combined with clinical observations, patient history, and epidemiological information. The expected result is Negative.  Fact Sheet for Patients: SugarRoll.be  Fact Sheet for Healthcare Providers: https://www.woods-mathews.com/  This test is not yet approved or cleared by the [place] FDA and  has been authorized for detection and/or diagnosis of SARS-CoV-2 by FDA under an Emergency Use Authorization (EUA). This EUA will remain  in effect (meaning this test can be used) for the duration of the COVID-19 declaration under Se ction 564(b)(1) of the Act, 21 U.S.C. section 360bbb-3(b)(1), unless the authorization is terminated or revoked sooner.  Performed at [facility], [place] [address], [place], [place] [address]   ECHOCARDIOGRAM COMPLETE     Status: None   Collection Time: 04/20/20 10:48 AM  Result Value Ref Range   Ao pk vel 1.40 m/s   AV Area VTI 2.90 cm2   AR max vel 2.77 cm2   AV Mean grad 4.0 mmHg     AV Peak grad 7.8 mmHg   S' Lateral 2.82 cm   AV Area mean vel 2.59 cm2   Area-P 1/2 6.32 cm2  NM Myocar Multi W/Spect W/Wall Motion / EF     Status: None (Preliminary result)   Collection Time: 04/20/20 12:48 PM  Result Value Ref Range   Rest HR 76 bpm   Rest BP 110/69 mmHg   Exercise duration (sec) 3 sec   Exercise duration (min) 1 min   Estimated workload 1.0 METS   Peak HR 103 bpm   Peak BP 110/69 mmHg   SSS 1    SRS 1    SDS 0    TID 1.02    LV sys vol 35 mL   LV dias vol 79 62 - 150 mL    Assessment/Plan:  COPD (chronic obstructive pulmonary disease) On chronic oxygen therapy.  Followed by pulmonology.  Type 2 diabetes mellitus without complication, without long-term current use of insulin (HCC) blood glucose control important in reducing the progression of atherosclerotic disease. Also, involved in wound healing. On appropriate medications.   Hyperlipidemia lipid control important in reducing the progression of atherosclerotic disease. Continue statin therapy   Atherosclerosis of native arteries of extremity with intermittent claudication [facility]) His cardiologist  recently performed ABIs which I have independently reviewed.  His right ABI was 1.04 with a left ABI of 0.87.  He has what appear to be biphasic waveforms a little more blunted on the left than the right.   We had a long discussion today about peripheral arterial disease and why and how it causes symptoms.  Some of his symptoms sound like they may be neuropathic or musculoskeletal, but his more severe leg does correlate with the leg with reduced ABI.  His ABIs are fairly good which may be indicative of inflow disease where the ABIs are often preserved at rest.  This would also go along with his symptoms.  He is very unsure in having anything done that might help his legs gain strength and have less pain.  We discussed an angiogram with possible revascularization was a reasonable option.  We discussed the risks and  benefits of this procedure.  The patient would like to have this done and this would be scheduled in the near future at his convenience.      [name] 06/02/2020, 10:36 AM   This note was created with Dragon medical transcription system.  Any errors from dictation are unintentional.

## 2020-06-03 ENCOUNTER — Telehealth (INDEPENDENT_AMBULATORY_CARE_PROVIDER_SITE_OTHER): Payer: Self-pay

## 2020-06-03 NOTE — Telephone Encounter (Signed)
I attempted to contact the patient and received a busy signal, I was unable to leave a message for a return call.

## 2020-06-04 NOTE — Telephone Encounter (Signed)
Patient's wife called back and the patient is scheduled with Dr. Lucky Cowboy for a left leg angio on 06/11/20 with a 9:15 am arrival time to the [facility]. Covid testing on 06/09/20 between 8-1 pm at the [place]. Pre-procedure instructions were discussed and will be mailed.

## 2020-06-09 ENCOUNTER — Other Ambulatory Visit: Payer: Self-pay

## 2020-06-09 ENCOUNTER — Other Ambulatory Visit
Admission: RE | Admit: 2020-06-09 | Discharge: 2020-06-09 | Disposition: A | Discharge status: Home / Self Care | Payer: BC Managed Care – PPO | Source: Ambulatory Visit | Attending: Vascular Surgery | Admitting: Vascular Surgery

## 2020-06-09 DIAGNOSIS — Z01812 Encounter for preprocedural laboratory examination: Secondary | ICD-10-CM | POA: Diagnosis present

## 2020-06-09 DIAGNOSIS — Z20822 Contact with and (suspected) exposure to covid-19: Secondary | ICD-10-CM | POA: Diagnosis not present

## 2020-06-10 LAB — SARS CORONAVIRUS 2 (TAT 6-24 HRS): SARS Coronavirus 2: NEGATIVE

## 2020-06-11 ENCOUNTER — Other Ambulatory Visit: Payer: Self-pay

## 2020-06-11 ENCOUNTER — Other Ambulatory Visit (INDEPENDENT_AMBULATORY_CARE_PROVIDER_SITE_OTHER): Payer: Self-pay | Admitting: Nurse Practitioner

## 2020-06-11 ENCOUNTER — Encounter: Payer: Self-pay | Admitting: Vascular Surgery

## 2020-06-11 ENCOUNTER — Encounter
Admission: RE | Disposition: A | Discharge status: Home / Self Care | Payer: Self-pay | Source: Home / Self Care | Attending: Vascular Surgery

## 2020-06-11 ENCOUNTER — Ambulatory Visit
Admission: RE | Admit: 2020-06-11 | Discharge: 2020-06-11 | Disposition: A | Discharge status: Home / Self Care | Payer: BC Managed Care – PPO | Attending: Vascular Surgery | Admitting: Vascular Surgery

## 2020-06-11 DIAGNOSIS — F419 Anxiety disorder, unspecified: Secondary | ICD-10-CM | POA: Diagnosis not present

## 2020-06-11 DIAGNOSIS — Z888 Allergy status to other drugs, medicaments and biological substances status: Secondary | ICD-10-CM | POA: Insufficient documentation

## 2020-06-11 DIAGNOSIS — E039 Hypothyroidism, unspecified: Secondary | ICD-10-CM | POA: Insufficient documentation

## 2020-06-11 DIAGNOSIS — K219 Gastro-esophageal reflux disease without esophagitis: Secondary | ICD-10-CM | POA: Insufficient documentation

## 2020-06-11 DIAGNOSIS — Z881 Allergy status to other antibiotic agents status: Secondary | ICD-10-CM | POA: Diagnosis not present

## 2020-06-11 DIAGNOSIS — E785 Hyperlipidemia, unspecified: Secondary | ICD-10-CM | POA: Diagnosis not present

## 2020-06-11 DIAGNOSIS — Z9981 Dependence on supplemental oxygen: Secondary | ICD-10-CM | POA: Diagnosis not present

## 2020-06-11 DIAGNOSIS — Z882 Allergy status to sulfonamides status: Secondary | ICD-10-CM | POA: Diagnosis not present

## 2020-06-11 DIAGNOSIS — Z7984 Long term (current) use of oral hypoglycemic drugs: Secondary | ICD-10-CM | POA: Insufficient documentation

## 2020-06-11 DIAGNOSIS — F17210 Nicotine dependence, cigarettes, uncomplicated: Secondary | ICD-10-CM | POA: Insufficient documentation

## 2020-06-11 DIAGNOSIS — Z79899 Other long term (current) drug therapy: Secondary | ICD-10-CM | POA: Insufficient documentation

## 2020-06-11 DIAGNOSIS — J449 Chronic obstructive pulmonary disease, unspecified: Secondary | ICD-10-CM | POA: Diagnosis not present

## 2020-06-11 DIAGNOSIS — E7800 Pure hypercholesterolemia, unspecified: Secondary | ICD-10-CM | POA: Diagnosis not present

## 2020-06-11 DIAGNOSIS — Z8673 Personal history of transient ischemic attack (TIA), and cerebral infarction without residual deficits: Secondary | ICD-10-CM | POA: Diagnosis not present

## 2020-06-11 DIAGNOSIS — I70212 Atherosclerosis of native arteries of extremities with intermittent claudication, left leg: Secondary | ICD-10-CM | POA: Insufficient documentation

## 2020-06-11 DIAGNOSIS — E1151 Type 2 diabetes mellitus with diabetic peripheral angiopathy without gangrene: Secondary | ICD-10-CM | POA: Diagnosis present

## 2020-06-11 DIAGNOSIS — Z91041 Radiographic dye allergy status: Secondary | ICD-10-CM | POA: Diagnosis not present

## 2020-06-11 DIAGNOSIS — I70219 Atherosclerosis of native arteries of extremities with intermittent claudication, unspecified extremity: Secondary | ICD-10-CM

## 2020-06-11 HISTORY — PX: LOWER EXTREMITY ANGIOGRAPHY: CATH118251

## 2020-06-11 LAB — CREATININE, SERUM
Creatinine, Ser: 0.98 mg/dL (ref 0.61–1.24)
GFR, Estimated: >60 mL/min (ref >60)

## 2020-06-11 LAB — BUN: BUN: 15 mg/dL (ref 8–23)

## 2020-06-11 LAB — GLUCOSE, CAPILLARY
Glucose-Capillary: 123 mg/dL — ABNORMAL HIGH (ref 70–99)
Glucose-Capillary: 161 mg/dL — ABNORMAL HIGH (ref 70–99)

## 2020-06-11 SURGERY — LOWER EXTREMITY ANGIOGRAPHY
Anesthesia: Moderate Sedation | Site: Leg Lower | Laterality: Left

## 2020-06-11 MED ORDER — CLOPIDOGREL BISULFATE 75 MG PO TABS: 75.0000 mg | ORAL_TABLET | Freq: Every day | ORAL | 11 refills | Status: DC

## 2020-06-11 MED ORDER — FAMOTIDINE 20 MG PO TABS
ORAL_TABLET | ORAL | Status: AC
  Filled 2020-06-11: qty 1

## 2020-06-11 MED ORDER — ACETAMINOPHEN 325 MG PO TABS: 650.0000 mg | ORAL_TABLET | ORAL | Status: DC | PRN

## 2020-06-11 MED ORDER — ONDANSETRON HCL 4 MG/2ML IJ SOLN: 4.0000 mg | Freq: Four times a day (QID) | INTRAMUSCULAR | Status: DC | PRN

## 2020-06-11 MED ORDER — MIDAZOLAM HCL 5 MG/5ML IJ SOLN
INTRAMUSCULAR | Status: AC
  Filled 2020-06-11: qty 5

## 2020-06-11 MED ORDER — FENTANYL CITRATE (PF) 100 MCG/2ML IJ SOLN
INTRAMUSCULAR | Status: DC | PRN
  Administered 2020-06-11: 50 ug via INTRAVENOUS

## 2020-06-11 MED ORDER — CLOPIDOGREL BISULFATE 75 MG PO TABS: 75.0000 mg | ORAL_TABLET | Freq: Every day | ORAL | Status: DC

## 2020-06-11 MED ORDER — DIPHENHYDRAMINE HCL 50 MG/ML IJ SOLN
INTRAMUSCULAR | Status: AC
  Administered 2020-06-11: 50 mg via INTRAVENOUS
  Filled 2020-06-11: qty 1

## 2020-06-11 MED ORDER — ASPIRIN EC 81 MG PO TBEC: 81.0000 mg | DELAYED_RELEASE_TABLET | Freq: Every day | ORAL | Status: DC

## 2020-06-11 MED ORDER — FAMOTIDINE 20 MG PO TABS
40.0000 mg | ORAL_TABLET | Freq: Once | ORAL | Status: AC | PRN
  Administered 2020-06-11: 40 mg via ORAL

## 2020-06-11 MED ORDER — HEPARIN SODIUM (PORCINE) 1000 UNIT/ML IJ SOLN
INTRAMUSCULAR | Status: AC
  Filled 2020-06-11: qty 1

## 2020-06-11 MED ORDER — SODIUM CHLORIDE 0.9% FLUSH: 3.0000 mL | INTRAVENOUS | Status: DC | PRN

## 2020-06-11 MED ORDER — SODIUM CHLORIDE 0.9 % IV SOLN
INTRAVENOUS | Status: DC
  Administered 2020-06-11: 300 mL via INTRAVENOUS

## 2020-06-11 MED ORDER — METHYLPREDNISOLONE SODIUM SUCC 125 MG IJ SOLR: 125.0000 mg | Freq: Once | INTRAMUSCULAR | Status: AC | PRN

## 2020-06-11 MED ORDER — HYDROMORPHONE HCL 1 MG/ML IJ SOLN: 1.0000 mg | Freq: Once | INTRAMUSCULAR | Status: DC | PRN

## 2020-06-11 MED ORDER — HEPARIN SODIUM (PORCINE) 1000 UNIT/ML IJ SOLN
INTRAMUSCULAR | Status: DC | PRN
  Administered 2020-06-11: 5000 [IU] via INTRAVENOUS

## 2020-06-11 MED ORDER — HYDRALAZINE HCL 20 MG/ML IJ SOLN: 5.0000 mg | INTRAMUSCULAR | Status: DC | PRN

## 2020-06-11 MED ORDER — SODIUM CHLORIDE 0.9 % IV SOLN: INTRAVENOUS | Status: DC

## 2020-06-11 MED ORDER — MIDAZOLAM HCL 2 MG/2ML IJ SOLN
INTRAMUSCULAR | Status: DC | PRN
  Administered 2020-06-11: 2 mg via INTRAVENOUS

## 2020-06-11 MED ORDER — SODIUM CHLORIDE 0.9 % IV SOLN: 250.0000 mL | INTRAVENOUS | Status: DC | PRN

## 2020-06-11 MED ORDER — ASPIRIN EC 81 MG PO TBEC: 81.0000 mg | DELAYED_RELEASE_TABLET | Freq: Every day | ORAL | 2 refills | Status: DC

## 2020-06-11 MED ORDER — DIPHENHYDRAMINE HCL 50 MG/ML IJ SOLN: 50.0000 mg | Freq: Once | INTRAMUSCULAR | Status: AC | PRN

## 2020-06-11 MED ORDER — IODIXANOL 320 MG/ML IV SOLN
INTRAVENOUS | Status: DC | PRN
  Administered 2020-06-11: 50 mL via INTRA_ARTERIAL

## 2020-06-11 MED ORDER — MIDAZOLAM HCL 2 MG/ML PO SYRP: 8.0000 mg | ORAL_SOLUTION | Freq: Once | ORAL | Status: DC | PRN

## 2020-06-11 MED ORDER — FENTANYL CITRATE (PF) 100 MCG/2ML IJ SOLN
INTRAMUSCULAR | Status: AC
  Filled 2020-06-11: qty 2

## 2020-06-11 MED ORDER — SODIUM CHLORIDE 0.9% FLUSH: 3.0000 mL | Freq: Two times a day (BID) | INTRAVENOUS | Status: DC

## 2020-06-11 MED ORDER — CEFAZOLIN SODIUM-DEXTROSE 2-4 GM/100ML-% IV SOLN
2.0000 g | Freq: Once | INTRAVENOUS | Status: AC
  Administered 2020-06-11: 2 g via INTRAVENOUS

## 2020-06-11 MED ORDER — METHYLPREDNISOLONE SODIUM SUCC 125 MG IJ SOLR
INTRAMUSCULAR | Status: AC
  Administered 2020-06-11: 125 mg via INTRAVENOUS
  Filled 2020-06-11: qty 2

## 2020-06-11 MED ORDER — LABETALOL HCL 5 MG/ML IV SOLN: 10.0000 mg | INTRAVENOUS | Status: DC | PRN

## 2020-06-11 SURGICAL SUPPLY — 13 items
BALLN ULTRVRSE 10X40X75 (BALLOONS) ×2
BALLOON ULTRVRSE 10X40X75 (BALLOONS) IMPLANT
CATH ANGIO 5F PIGTAIL 65CM (CATHETERS) ×1 IMPLANT
DEVICE STARCLOSE SE CLOSURE (Vascular Products) ×2 IMPLANT
GLIDEWIRE ADV .035X260CM (WIRE) ×1 IMPLANT
KIT ENCORE 26 ADVANTAGE (KITS) ×1 IMPLANT
PACK ANGIOGRAPHY (CUSTOM PROCEDURE TRAY) ×2 IMPLANT
SHEATH BRITE TIP 5FRX11 (SHEATH) ×1 IMPLANT
SHEATH FLEXOR ANSEL2 7FRX45 (SHEATH) ×2 IMPLANT
STENT LIFESTREAM 9X38X80 (Permanent Stent) ×1 IMPLANT
SYR MEDRAD MARK 7 150ML (SYRINGE) ×1 IMPLANT
TUBING CONTRAST HIGH PRESS 72 (TUBING) ×2 IMPLANT
WIRE J 3MM .035X145CM (WIRE) ×1 IMPLANT

## 2020-06-11 NOTE — Progress Notes (Signed)
Dr. Lucky Cowboy spoke to patient's wife regarding procedure. Patient "Kirk Robinson" but able to verbalize "yes."  To be on bedrest for two hours per Dr. Lucky Cowboy

## 2020-06-11 NOTE — Op Note (Signed)
[facility]  Percutaneous Study/Intervention Procedural Note   Date of Surgery: 06/11/2020  Surgeon(s):Dru Laurel    Assistants:none  Pre-operative Diagnosis: PAD with claudication LLE  Post-operative diagnosis:  Same  Procedure(s) Performed:             1.  Ultrasound guidance for vascular access right femoral artery             2.  Catheter placement into left SFA from right femoral approach             3.  Aortogram and selective left lower extremity angiogram             4.   Lifestream stent placement to the left common iliac artery with 9 mm diameter by 38 mm length stent postdilated with a 10 mm balloon             5.   StarClose closure device right femoral artery  EBL: 5 cc  Contrast: 50 cc  Fluoro Time: 2.7 minutes  Moderate Conscious Sedation Time: approximately 34 minutes using 2 mg of Versed and 50 mcg of Fentanyl              Indications:  Patient is a 64 y.o.male with short distance claudication symptoms of the left leg. The patient has noninvasive study showing a reduced left ABI in the 0.8 range with a normal right ABI. The patient is brought in for angiography for further evaluation and potential treatment. Risks and benefits are discussed and informed consent is obtained.   Procedure:  The patient was identified and appropriate procedural time out was performed.  The patient was then placed supine on the table and prepped and draped in the usual sterile fashion. Moderate conscious sedation was administered during a face to face encounter with the patient throughout the procedure with my supervision of the RN administering medicines and monitoring the patient's vital signs, pulse oximetry, telemetry and mental status throughout from the start of the procedure until the patient was taken to the recovery room. Ultrasound was used to evaluate the right common femoral artery.  It was patent .  A digital ultrasound image was acquired.  A Seldinger  needle was used to access the right common femoral artery under direct ultrasound guidance and a permanent image was performed.  A 0.035 J wire was advanced without resistance and a 5Fr sheath was placed.  Pigtail catheter was placed into the aorta and an AP aortogram was performed.  I then pulled down to the aortic bifurcation perform pelvic obliques to help opacify the iliac arteries.  This demonstrated normal renal arteries, normal aorta.  The right iliac system was widely patent without any significant stenosis.  The left common iliac artery had a very calcified rocklike lesion in the mid left common iliac artery creating a 75 to 80% stenosis.  The iliac bifurcation was then present with a small left hypogastric artery.  The left external iliac artery normalized and was patent down to the left common femoral artery which was widely patent.  I then crossed the aortic bifurcation and advanced to the proximal left superficial femoral artery and selective imaging was performed. Selective left lower extremity angiogram was then performed. This demonstrated normal superficial femoral artery, popliteal artery, and tibial vessels with a normal tibial trifurcation and two-vessel runoff distally.  Left iliac intervention would be helpful, but no infrainguinal treatment was required on the left leg.  It was felt that it was in the patient's  best interest to proceed with intervention after these images to avoid a second procedure and a larger amount of contrast and fluoroscopy based off of the findings from the initial angiogram. The patient was systemically heparinized and a 7 [place] An[place] sheath was then placed over the [employer] wire.  The sheath was advanced just past the lesion.  I then selected a 9 mm diameter by 38 mm length lifestream stent for the lesion in the left common iliac artery and pulled the sheath back proximal to the lesion uncovering the stent.  The stent was then deployed in the left common  iliac artery staying just above the left internal iliac artery.  It was then postdilated with a 10 mm balloon with excellent angiographic completion result and less than 10% residual stenosis. I elected to terminate the procedure. The sheath was removed and StarClose closure device was deployed in the right femoral artery with excellent hemostatic result. The patient was taken to the recovery room in stable condition having tolerated the procedure well.  Findings:               Aortogram:  Normal renal arteries, normal aorta.  The right iliac system was widely patent without any significant stenosis.  The left common iliac artery had a very calcified rocklike lesion in the mid left common iliac artery creating a 75 to 80% stenosis.  The iliac bifurcation was then present with a small left hypogastric artery.  The left external iliac artery normalized and was patent down to the left common femoral artery which was widely patent.             Left Lower Extremity:  This demonstrated normal superficial femoral artery, popliteal artery, and tibial vessels with a normal tibial trifurcation and two-vessel runoff distally   Disposition: Patient was taken to the recovery room in stable condition having tolerated the procedure well.  Complications: None  [name] 06/11/2020 11:10 AM   This note was created with Dragon Medical transcription system. Any errors in dictation are purely unintentional.

## 2020-06-11 NOTE — Discharge Instructions (Signed)
Moderate Conscious Sedation, Adult, Care After These instructions provide you with information about caring for yourself after your procedure. Your health care provider may also give you more specific instructions. Your treatment has been planned according to current medical practices, but problems sometimes occur. Call your health care provider if you have any problems or questions after your procedure. What can I expect after the procedure? After your procedure, it is common:  To feel sleepy for several hours.  To feel clumsy and have poor balance for several hours.  To have poor judgment for several hours.  To vomit if you eat too soon. Follow these instructions at home: For at least 24 hours after the procedure:   Do not: ? Participate in activities where you could fall or become injured. ? Drive. ? Use heavy machinery. ? Drink alcohol. ? Take sleeping pills or medicines that cause drowsiness. ? Make important decisions or sign legal documents. ? Take care of children on your own.  Rest. Eating and drinking  Follow the diet recommended by your health care provider.  If you vomit: ? Drink water, juice, or soup when you can drink without vomiting. ? Make sure you have little or no nausea before eating solid foods. General instructions  Have a responsible adult stay with you until you are awake and alert.  Take over-the-counter and prescription medicines only as told by your health care provider.  If you smoke, do not smoke without supervision.  Keep all follow-up visits as told by your health care provider. This is important. Contact a health care provider if:  You keep feeling nauseous or you keep vomiting.  You feel light-headed.  You develop a rash.  You have a fever. Get help right away if:  You have trouble breathing. This information is not intended to replace advice given to you by your health care provider. Make sure you discuss any questions you have  with your health care provider. Document Revised: 07/21/2017 Document Reviewed: 11/28/2015 Elsevier Patient Education  [place].   Femoral Site Care This sheet gives you information about how to care for yourself after your procedure. Your health care provider may also give you more specific instructions. If you have problems or questions, contact your health care provider. What can I expect after the procedure? After the procedure, it is common to have:  Bruising that usually fades within 1-2 weeks.  Tenderness at the site. Follow these instructions at home: Wound care  Follow instructions from your health care provider about how to take care of your insertion site. Make sure you: ? Wash your hands with soap and water before you change your bandage (dressing). If soap and water are not available, use hand sanitizer. ? Change your dressing as told by your health care provider. ? Leave stitches (sutures), skin glue, or adhesive strips in place. These skin closures may need to stay in place for 2 weeks or longer. If adhesive strip edges start to loosen and curl up, you may trim the loose edges. Do not remove adhesive strips completely unless your health care provider tells you to do that.  Do not take baths, swim, or use a hot tub until your health care provider approves.  You may shower 24-48 hours after the procedure or as told by your health care provider. ? Gently wash the site with plain soap and water. ? Pat the area dry with a clean towel. ? Do not rub the site. This may cause bleeding.  Do not apply powder or lotion to the site. Keep the site clean and dry.  Check your femoral site every day for signs of infection. Check for: ? Redness, swelling, or pain. ? Fluid or blood. ? Warmth. ? Pus or a bad smell. Activity  For the first 2-3 days after your procedure, or as long as directed: ? Avoid climbing stairs as much as possible. ? Do not squat.  Do not lift  anything that is heavier than 10 lb (4.5 kg), or the limit that you are told, until your health care provider says that it is safe.  Rest as directed. ? Avoid sitting for a long time without moving. Get up to take short walks every 1-2 hours.  Do not drive for 24 hours if you were given a medicine to help you relax (sedative). General instructions  Take over-the-counter and prescription medicines only as told by your health care provider.  Keep all follow-up visits as told by your health care provider. This is important. Contact a health care provider if you have:  A fever or chills.  You have redness, swelling, or pain around your insertion site. Get help right away if:  The catheter insertion area swells very fast.  You pass out.  You suddenly start to sweat or your skin gets clammy.  The catheter insertion area is bleeding, and the bleeding does not stop when you hold steady pressure on the area.  The area near or just beyond the catheter insertion site becomes pale, cool, tingly, or numb. These symptoms may represent a serious problem that is an emergency. Do not wait to see if the symptoms will go away. Get medical help right away. Call your local emergency services (911 in the [place] Do not drive yourself to the hospital. Summary  After the procedure, it is common to have bruising that usually fades within 1-2 weeks.  Check your femoral site every day for signs of infection.  Do not lift anything that is heavier than 10 lb (4.5 kg), or the limit that you are told, until your health care provider says that it is safe. This information is not intended to replace advice given to you by your health care provider. Make sure you discuss any questions you have with your health care provider. Document Revised: 08/21/2017 Document Reviewed: 08/21/2017 Elsevier Patient Education  2020 [place] After This sheet gives you information about how to care for  yourself after your procedure. Your doctor may also give you more specific instructions. If you have problems or questions, contact your doctor. Follow these instructions at home: Insertion site care  Follow instructions from your doctor about how to take care of your long, thin tube (catheter) insertion area. Make sure you: ? Wash your hands with soap and water before you change your bandage (dressing). If you cannot use soap and water, use hand sanitizer. ? Change your bandage as told by your doctor. ? Leave stitches (sutures), skin glue, or skin tape (adhesive) strips in place. They may need to stay in place for 2 weeks or longer. If tape strips get loose and curl up, you may trim the loose edges. Do not remove tape strips completely unless your doctor says it is okay.  Do not take baths, swim, or use a hot tub until your doctor says it is okay.  You may shower 24-48 hours after the procedure or as told by your doctor. ? Gently wash the area with plain  soap and water. ? Pat the area dry with a clean towel. ? Do not rub the area. This may cause bleeding.  Do not apply powder or lotion to the area. Keep the area clean and dry.  Check your insertion area every day for signs of infection. Check for: ? More redness, swelling, or pain. ? Fluid or blood. ? Warmth. ? Pus or a bad smell. Activity  Rest as told by your doctor, usually for 1-2 days.  Do not lift anything that is heavier than 10 lbs. (4.5 kg) or as told by your doctor.  Do not drive for 24 hours if you were given a medicine to help you relax (sedative).  Do not drive or use heavy machinery while taking prescription pain medicine. General instructions   Go back to your normal activities as told by your doctor, usually in about a week. Ask your doctor what activities are safe for you.  If the insertion area starts to bleed, lie flat and put pressure on the area. If the bleeding does not stop, get help right away. This is an  emergency.  Drink enough fluid to keep your pee (urine) clear or pale yellow.  Take over-the-counter and prescription medicines only as told by your doctor.  Keep all follow-up visits as told by your doctor. This is important. Contact a doctor if:  You have a fever.  You have chills.  You have more redness, swelling, or pain around your insertion area.  You have fluid or blood coming from your insertion area.  The insertion area feels warm to the touch.  You have pus or a bad smell coming from your insertion area.  You have more bruising around the insertion area.  Blood collects in the tissue around the insertion area (hematoma) that may be painful to the touch. Get help right away if:  You have a lot of pain in the insertion area.  The insertion area swells very fast.  The insertion area is bleeding, and the bleeding does not stop after holding steady pressure on the area.  The area near or just beyond the insertion area becomes pale, cool, tingly, or numb. These symptoms may be an emergency. Do not wait to see if the symptoms will go away. Get medical help right away. Call your local emergency services (911 in the [place] Do not drive yourself to the hospital. Summary  After the procedure, it is common to have bruising and tenderness at the long, thin tube insertion area.  After the procedure, it is important to rest and drink plenty of fluids.  Do not take baths, swim, or use a hot tub until your doctor says it is okay to do so. You may shower 24-48 hours after the procedure or as told by your doctor.  If the insertion area starts to bleed, lie flat and put pressure on the area. If the bleeding does not stop, get help right away. This is an emergency. This information is not intended to replace advice given to you by your health care provider. Make sure you discuss any questions you have with your health care provider. Document Revised: 07/21/2017 Document Reviewed:  08/02/2016 Elsevier Patient Education  2020 [employer].

## 2020-06-11 NOTE — Interval H&P Note (Signed)
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  The various methods of treatment have been discussed with the patient and family. After consideration of risks, benefits and other options for treatment, the patient has consented to  Procedure(s): LOWER EXTREMITY ANGIOGRAPHY (Left) as a surgical intervention.  The patient's history has been reviewed, patient examined, no change in status, stable for surgery.  I have reviewed the patient's chart and labs.  Questions were answered to the patient's satisfaction.     [name]

## 2020-06-11 NOTE — Progress Notes (Signed)
Pt able to sit up and eat at this time. Wife at the bedside.

## 2020-06-11 NOTE — Progress Notes (Signed)
Wife to bedside to update medication list.

## 2020-06-12 ENCOUNTER — Encounter: Payer: Self-pay | Admitting: Vascular Surgery

## 2020-07-10 ENCOUNTER — Other Ambulatory Visit (INDEPENDENT_AMBULATORY_CARE_PROVIDER_SITE_OTHER): Payer: Self-pay | Admitting: Vascular Surgery

## 2020-07-10 DIAGNOSIS — Z95820 Peripheral vascular angioplasty status with implants and grafts: Secondary | ICD-10-CM

## 2020-07-10 DIAGNOSIS — I70212 Atherosclerosis of native arteries of extremities with intermittent claudication, left leg: Secondary | ICD-10-CM

## 2020-07-14 ENCOUNTER — Other Ambulatory Visit: Payer: Self-pay

## 2020-07-14 ENCOUNTER — Encounter (INDEPENDENT_AMBULATORY_CARE_PROVIDER_SITE_OTHER): Payer: Self-pay | Admitting: Vascular Surgery

## 2020-07-14 ENCOUNTER — Ambulatory Visit (INDEPENDENT_AMBULATORY_CARE_PROVIDER_SITE_OTHER): Payer: BC Managed Care – PPO | Admitting: Vascular Surgery

## 2020-07-14 ENCOUNTER — Ambulatory Visit (INDEPENDENT_AMBULATORY_CARE_PROVIDER_SITE_OTHER): Payer: BC Managed Care – PPO

## 2020-07-14 VITALS — BP 107/67 | HR 74 | Resp 16 | Wt 167.8 lb

## 2020-07-14 DIAGNOSIS — Z95820 Peripheral vascular angioplasty status with implants and grafts: Secondary | ICD-10-CM

## 2020-07-14 DIAGNOSIS — J411 Mucopurulent chronic bronchitis: Secondary | ICD-10-CM | POA: Diagnosis not present

## 2020-07-14 DIAGNOSIS — E7800 Pure hypercholesterolemia, unspecified: Secondary | ICD-10-CM | POA: Diagnosis not present

## 2020-07-14 DIAGNOSIS — I70212 Atherosclerosis of native arteries of extremities with intermittent claudication, left leg: Secondary | ICD-10-CM

## 2020-07-14 DIAGNOSIS — E119 Type 2 diabetes mellitus without complications: Secondary | ICD-10-CM | POA: Diagnosis not present

## 2020-07-14 NOTE — Assessment & Plan Note (Signed)
Noninvasive studies today showed normal ABIs of 1.07 on the right and 1.09 on the left with brisk triphasic waveforms.  This is an improvement from his preprocedure levels on the left.  Symptomatically, he is much better.  He still says his legs do not have full energy but they are improving.  He is not limited by claudication pain anymore.  Continue current medical regimen.  Return in 3 months with noninvasive studies.

## 2020-07-14 NOTE — Progress Notes (Signed)
MRN : [id number]  Kirk Robinson is a 64 y.o. (Mar 05, 1956) male who presents with chief complaint of  Chief Complaint  Patient presents with  . Follow-up    [facility] 5wk abi  .  History of Present Illness: Patient returns today in follow up of his PAD.  About a month ago, he underwent left iliac artery stenting for a high-grade left common iliac artery lesion.  This was done for disabling claudication symptoms.  Within a few days, his claudication symptoms were gone.  His access site healed well.  He still says his legs are somewhat weak and do not have great stamina or energy, but they are improving.  He is not limited by pain when walking anymore. Noninvasive studies today showed normal ABIs of 1.07 on the right and 1.09 on the left with brisk triphasic waveforms.  This is an improvement from his preprocedure levels on the left.   Current Outpatient Medications  Medication Sig Dispense Refill  . albuterol (VENTOLIN HFA) 108 (90 Base) MCG/ACT inhaler Inhale 2 puffs into the lungs every 6 (six) hours as needed for wheezing or shortness of breath. 18 g 0  . aspirin EC 81 MG tablet Take 1 tablet (81 mg total) by mouth daily. 150 tablet 2  . atorvastatin (LIPITOR) 20 MG tablet Take 1 tablet (20 mg total) by mouth daily at 6 PM. 90 tablet 3  . Blood Glucose Monitoring Suppl (ACCU-CHEK GUIDE) w/Device KIT See admin instructions.    . clopidogrel (PLAVIX) 75 MG tablet Take 1 tablet (75 mg total) by mouth daily. 30 tablet 11  . diazepam (VALIUM) 5 MG tablet Take 5 mg by mouth 2 (two) times daily.     Marland Kitchen diltiazem (CARDIZEM CD) 120 MG 24 hr capsule Take by mouth daily.    . ESBRIET 267 MG TABS Take 3 tablets by mouth 3 (three) times daily as needed.    . metFORMIN (GLUCOPHAGE) 1000 MG tablet TAKE 1 TABLET BY MOUTH 2 TIMES DAILY WITH MEAL. (Patient taking differently: Take 1,000 mg by mouth 2 (two) times daily with a meal. ) 180 tablet 0  . metoprolol succinate (TOPROL-XL) 25 MG 24 hr tablet Take by  mouth.    . mirtazapine (REMERON) 15 MG tablet Take by mouth.    . morphine (MS CONTIN) 15 MG 12 hr tablet Take 1 tablet (15 mg total) by mouth every 8 (eight) hours.  0  . omeprazole (PRILOSEC) 20 MG capsule Take by mouth.    . predniSONE (DELTASONE) 10 MG tablet Take 5 mg by mouth every evening.     Marland Kitchen rOPINIRole (REQUIP) 0.5 MG tablet Take by mouth.    . thyroid (ARMOUR) 90 MG tablet Take 120 mg by mouth daily at 2 PM.     . venlafaxine XR (EFFEXOR-XR) 150 MG 24 hr capsule Take 1 capsule (150 mg total) by mouth daily with breakfast. 90 capsule 1  . Vitamin D, Ergocalciferol, (DRISDOL) 1.25 MG (50000 UNIT) CAPS capsule Take by mouth.    Marland Kitchen alendronate (FOSAMAX) 70 MG tablet Take by mouth. (Patient not taking: Reported on 06/11/2020)    . CHANTIX 1 MG tablet Take 1 mg by mouth 2 (two) times daily.  (Patient not taking: Reported on 06/11/2020)    . Cholecalciferol 1.25 MG (50000 UT) capsule Take by mouth. (Patient not taking: Reported on 06/11/2020)    . Ensure Max Protein (ENSURE MAX PROTEIN) LIQD Take 330 mLs (11 oz total) by mouth 2 (two) times daily. (Patient  not taking: Reported on 06/11/2020) 330 mL 0  . famotidine (PEPCID) 20 MG tablet Take by mouth.    . linagliptin (TRADJENTA) 5 MG TABS tablet Take 1 tablet (5 mg total) by mouth daily. (Patient not taking: Reported on 06/11/2020) 30 tablet 0  . polyethylene glycol (MIRALAX / GLYCOLAX) 17 g packet Take 17 g by mouth daily. (Patient not taking: Reported on 06/11/2020) 30 each 0  . predniSONE (DELTASONE) 10 MG tablet Take 20 mg by mouth daily with breakfast.  (Patient not taking: Reported on 06/11/2020)    . sitaGLIPtin (JANUVIA) 100 MG tablet Take by mouth.    . Tapentadol HCl (NUCYNTA) 75 MG TABS Take 75 mg by mouth every 4 (four) hours as needed for moderate pain or severe pain.  (Patient not taking: Reported on 06/11/2020)    . thiamine 100 MG tablet Take 1 tablet (100 mg total) by mouth daily. (Patient not taking: Reported on 06/11/2020)  30 tablet 0   No current facility-administered medications for this visit.    Past Medical History:  Diagnosis Date  . Alcohol use 03/16/2007   patient risk factors  . Allergic rhinitis, cause unspecified   . Altered mental status   . Anxiety state, unspecified   . Cervicalgia 09/17/2007  . COPD (chronic obstructive pulmonary disease) ([place])   . Diabetes mellitus without complication ([place])   . Diplopia 12/21/2007  . Esophageal reflux   . Gastritis 05/13/2009  . HCAP (healthcare-associated pneumonia) 08/12/2014  . Hematuria 03/16/2007  . Hyperthyroidism    graves disease  . Iodine hypothyroidism   . Memory loss   . NSIP (nonspecific interstitial pneumonia) ([place])    on azathioprine  . Osteoporosis, unspecified    bone density per Mor[facility] in 2012  . Other abnormal glucose 04/08/2008  . Other diseases of lung, not elsewhere classified 01/23/2009   s/p [place]/pulmonology consult; intolerant  to Advair  . Peripheral vascular disease, unspecified ([place]) 12/24/2009  . Personal history of colonic polyps   . Pneumonia, organism unspecified(486)   . Pure hypercholesterolemia   . Shortness of breath   . Stroke ([place]) 2005  . Tobacco use disorder 03/16/2007   patient risk factors  . Unspecified hypothyroidism 01/07/2010  . 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 Admission 11/2011     altered mental status/confusion with syncope.  CT head negative, MRI brain negative, EEG: diffuse slowing but no  seizure acitivity.  Labs negative.  [facility].  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 COLONOSCOPY WITH PROPOFOL N/A 03/10/2020   Procedure: COLONOSCOPY WITH PROPOFOL;  Surgeon: Jonathon Bellows, MD;  Location[facility] ENDOSCOPY;  Service: Gastroenterology;  Laterality: N/A;  . EYE SURGERY  2010  . LAPAROSCOPIC REVISION VENTRICULAR-PERITONEAL (V-P) SHUNT Right 05/20/2014   Procedure: LAPAROSCOPIC REVISION  VENTRICULAR-PERITONEAL (V-P) SHUNT;  Surgeon: Georganna Skeans, MD;  Location: [place] NEURO ORS;  Service: General;  Laterality: Right;  . LOWER EXTREMITY ANGIOGRAPHY Left 06/11/2020   Procedure: LOWER EXTREMITY ANGIOGRAPHY;  Surgeon: Algernon Huxley, MD;  Location: [place] CV LAB;  Service: Cardiovascular;  Laterality: Left;  . Neuropsychiatric evaluation  04/2012   poor short-term memory, poor recall, delayed reaction time; permanently disabled.       Social History   Tobacco Use  . Smoking status: Former Smoker    Packs/day: 1.00    Years: 30.00    Pack years: 30.00  . Smokeless tobacco: Never Used  Substance Use Topics  . Alcohol use: No  . Drug use: No       Family History  Problem Relation Age of Onset  . Heart disease Father        cad, chf  . Cancer Father   . Heart disease Brother 5       CABG  . Cancer Brother 23       pancreatic cancer  . Diabetes Brother   . Diabetes Brother   . Heart disease Brother 69       CABG  . Diabetes Brother   . Heart disease Brother 68       CABG  . Cancer Brother 35       prostate cancer  . Heart disease Mother        cad  . Diabetes Mother   . Stroke Mother      Allergies  Allergen Reactions  . Advair Diskus [Fluticasone-Salmeterol] Shortness Of Breath  . Erythromycin Nausea And Vomiting  . Iodinated Diagnostic Agents Shortness Of Breath and Other (See Comments)  . Ativan [Lorazepam] Other (See Comments)    Caused pt's heart to stop  . Darvocet [Propoxyphene N-Acetaminophen] Nausea And Vomiting  . Doxycycline Hyclate Nausea And Vomiting, Swelling and Other (See Comments)    Tongue swelling, severe depression  . Propoxyphene Nausea And Vomiting  . Septra [Sulfamethoxazole-Trimethoprim] Rash    REVIEW OF SYSTEMS (Negative unless checked)  Constitutional: '[]' ?Weight loss  '[]' ?Fever  '[]' ?Chills Cardiac: '[]' ?Chest pain   '[]' ?Chest pressure   '[]' ?Palpitations   '[]' ?Shortness of breath when laying flat   '[]' ?Shortness of breath  at rest   '[]' ?Shortness of breath with exertion. Vascular:  '[x]' ?Pain in legs with walking   '[]' ?Pain in legs at rest   '[]' ?Pain in legs when laying flat   '[x]' ?Claudication   '[]' ?Pain in feet when walking  '[]' ?Pain in feet at rest  '[]' ?Pain in feet when laying flat   '[]' ?History of DVT   '[]' ?Phlebitis   '[]' ?Swelling in legs   '[]' ?Varicose veins   '[]' ?Non-healing ulcers Pulmonary:   '[x]' ?Uses home oxygen   '[]' ?Productive cough   '[]' ?Hemoptysis   '[]' ?Wheeze  '[x]' ?COPD   '[]' ?Asthma Neurologic:  '[]' ?Dizziness  '[]' ?Blackouts   '[]' ?Seizures   '[x]' ?History of stroke   '[]' ?History of TIA  '[]' ?Aphasia   '[]' ?Temporary blindness   '[]' ?Dysphagia   '[]' ?Weakness or numbness in arms   '[]' ?Weakness or numbness in legs Musculoskeletal:  '[x]' ?Arthritis   '[]' ?Joint swelling   '[x]' ?Joint pain   '[]' ?Low back pain Hematologic:  '[]' ?Easy bruising  '[]' ?Easy bleeding   '[]' ?Hypercoagulable state   '[]' ?Anemic  '[]' ?Hepatitis Gastrointestinal:  '[]' ?Blood in stool   '[]' ?Vomiting blood  '[]' ?Gastroesophageal reflux/heartburn   '[]' ?Abdominal pain Genitourinary:  '[]' ?Chronic kidney disease   '[]' ?Difficult urination  '[]' ?Frequent urination  '[]' ?Burning with urination   '[]' ?Hematuria Skin:  '[]' ?Rashes   '[]' ?Ulcers   '[]' ?Wounds Psychological:  '[]' ?History of anxiety   '[]' ? History of major depression.  Physical Examination  BP 107/67   Pulse 74   Resp 16   Wt 167 lb 12.8 oz (76.1 kg)   BMI 25.51 kg/m  Gen:  WD/WN, NAD Head: [place]/AT, No temporalis wasting. Ear/Nose/Throat: Hearing grossly intact, nares w/o erythema or drainage Eyes: Conjunctiva clear. Sclera non-icteric Neck: Supple.  Trachea midline Pulmonary:  Good air movement, respirations mildly labored on supplemental oxygen Cardiac: RRR, no JVD Vascular:  Vessel Right Left  Radial Palpable Palpable  PT Palpable Palpable  DP Palpable Palpable    Musculoskeletal: M/S 5/5 throughout.  No deformity or atrophy. No edema. Neurologic: Sensation grossly intact in extremities.  Symmetrical.   Speech is fluent.  Psychiatric: Judgment intact, Mood & affect appropriate for pt's clinical situation. Dermatologic: No rashes or ulcers noted.  No cellulitis or open wounds.       Labs Recent Results (from the past 2160 hour(s))  ECHOCARDIOGRAM COMPLETE     Status: None   Collection Time: 04/20/20 10:48 AM  Result Value Ref Range   Ao pk vel 1.40 m/s   AV Area VTI 2.90 cm2   AR max vel 2.77 cm2   AV Mean grad 4.0 mmHg   AV Peak grad 7.8 mmHg   S' Lateral 2.82 cm   AV Area mean vel 2.59 cm2   Area-P 1/2 6.32 cm2  NM Myocar Multi W/Spect W/Wall Motion / EF     Status: None (Preliminary result)   Collection Time: 04/20/20 12:48 PM  Result Value Ref Range   Rest HR 76 bpm   Rest BP 110/69 mmHg   Exercise duration (sec) 3 sec   Exercise duration (min) 1 min   Estimated workload 1.0 METS   Peak HR 103 bpm   Peak BP 110/69 mmHg   SSS 1    SRS 1    SDS 0    TID 1.02    LV sys vol 35 mL   LV dias vol 79 62 - 150 mL  SARS CORONAVIRUS 2 (TAT 6-24 HRS) Nasopharyngeal Nasopharyngeal Swab     Status: None   Collection Time: 06/09/20 12:30 PM   Specimen: Nasopharyngeal Swab  Result Value Ref Range   SARS Coronavirus 2 NEGATIVE NEGATIVE    Comment: (NOTE) SARS-CoV-2 target nucleic acids are NOT DETECTED.  The SARS-CoV-2 RNA is generally detectable in upper and lower respiratory specimens during the acute phase of infection. Negative results do not preclude SARS-CoV-2 infection, do not rule out co-infections with other pathogens, and should not be used as the sole basis for treatment or other patient management decisions. Negative results must be combined with clinical observations, patient history, and epidemiological information. The expected result is Negative.  Fact Sheet for Patients: SugarRoll.be  Fact Sheet for Healthcare Providers: https://www.woods-mathews.com/  This test is not yet approved or cleared by the [place]  FDA and  has been authorized for detection and/or diagnosis of SARS-CoV-2 by FDA under an Emergency Use Authorization (EUA). This EUA will remain  in effect (meaning this test can be used) for the duration of the COVID-19 declaration under Se ction 564(b)(1) of the Act, 21 U.S.C. section 360bbb-3(b)(1), unless the authorization is terminated or revoked sooner.  Performed at [facility], [place] [address] [place], [place] [address]   BUN     Status: None   Collection Time: 06/11/20  9:23 AM  Result Value Ref Range   BUN 15 8 - 23 mg/dL    Comment: Performed at [facility], [address] [place], [place] [address]  Creatinine, serum     Status: None   Collection Time: 06/11/20  9:23 AM  Result Value Ref Range   Creatinine, Ser 0.98 0.61 - 1.24 mg/dL   GFR, Estimated >60 >60 mL/min    Comment: (NOTE) Calculated using the CKD-EPI Creatinine Equation (2021) Performed at [facility], [address] [place], [place] [address]   Glucose, capillary     Status: Abnormal   Collection Time:  06/11/20  9:40 AM  Result Value Ref Range   Glucose-Capillary 123 (H) 70 - 99 mg/dL    Comment: Glucose reference range applies only to samples taken after fasting for at least 8 hours.  Glucose, capillary     Status: Abnormal   Collection Time: 06/11/20 11:25 AM  Result Value Ref Range   Glucose-Capillary 161 (H) 70 - 99 mg/dL    Comment: Glucose reference range applies only to samples taken after fasting for at least 8 hours.    Radiology No results found.  Assessment/Plan COPD (chronic obstructive pulmonary disease) On chronic oxygen therapy.  Followed by pulmonology.  Type 2 diabetes mellitus without complication, without long-term current use of insulin (HCC) blood glucose control important in reducing the progression of atherosclerotic disease. Also, involved in wound healing. On appropriate medications.   Hyperlipidemia lipid control important in  reducing the progression of atherosclerotic disease. Continue statin therapy  Atherosclerosis of native arteries of extremity with intermittent claudication (HCC) Noninvasive studies today showed normal ABIs of 1.07 on the right and 1.09 on the left with brisk triphasic waveforms.  This is an improvement from his preprocedure levels on the left.  Symptomatically, he is much better.  He still says his legs do not have full energy but they are improving.  He is not limited by claudication pain anymore.  Continue current medical regimen.  Return in 3 months with noninvasive studies.    [name], MD  07/14/2020 11:57 AM    This note was created with Dragon medical transcription system.  Any errors from dictation are purely unintentional

## 2020-10-09 ENCOUNTER — Encounter (INDEPENDENT_AMBULATORY_CARE_PROVIDER_SITE_OTHER): Payer: Medicare Other

## 2020-10-09 ENCOUNTER — Ambulatory Visit (INDEPENDENT_AMBULATORY_CARE_PROVIDER_SITE_OTHER): Payer: Medicare Other | Admitting: Nurse Practitioner

## 2020-10-16 ENCOUNTER — Encounter (INDEPENDENT_AMBULATORY_CARE_PROVIDER_SITE_OTHER): Payer: Medicare Other

## 2020-10-16 ENCOUNTER — Ambulatory Visit (INDEPENDENT_AMBULATORY_CARE_PROVIDER_SITE_OTHER): Payer: Self-pay | Admitting: Nurse Practitioner

## 2020-11-26 ENCOUNTER — Other Ambulatory Visit: Payer: Self-pay

## 2020-11-26 ENCOUNTER — Ambulatory Visit (INDEPENDENT_AMBULATORY_CARE_PROVIDER_SITE_OTHER): Payer: Medicare HMO

## 2020-11-26 ENCOUNTER — Ambulatory Visit (INDEPENDENT_AMBULATORY_CARE_PROVIDER_SITE_OTHER): Payer: Medicare HMO | Admitting: Nurse Practitioner

## 2020-11-26 ENCOUNTER — Encounter (INDEPENDENT_AMBULATORY_CARE_PROVIDER_SITE_OTHER): Payer: Self-pay | Admitting: Nurse Practitioner

## 2020-11-26 VITALS — BP 115/63 | HR 83 | Resp 16 | Wt 182.0 lb

## 2020-11-26 DIAGNOSIS — I70212 Atherosclerosis of native arteries of extremities with intermittent claudication, left leg: Secondary | ICD-10-CM | POA: Diagnosis not present

## 2020-11-26 DIAGNOSIS — E119 Type 2 diabetes mellitus without complications: Secondary | ICD-10-CM

## 2020-11-26 DIAGNOSIS — E7800 Pure hypercholesterolemia, unspecified: Secondary | ICD-10-CM | POA: Diagnosis not present

## 2020-11-30 ENCOUNTER — Encounter (INDEPENDENT_AMBULATORY_CARE_PROVIDER_SITE_OTHER): Payer: Self-pay | Admitting: Nurse Practitioner

## 2020-11-30 NOTE — Progress Notes (Signed)
Subjective:    Patient ID: Kirk Robinson, male    DOB: [birth year], 65 y.o.   MRN: [id number] Chief Complaint  Patient presents with  . Follow-up    3 month abi    The patient returns to the office for followup and review of the noninvasive studies.  The patient has noted some worsening claudication no interval shortening of the patient's claudication distance or development of rest pain symptoms. No new ulcers or wounds have occurred since the last visit.  There have been no significant changes to the patient's overall health care.  The patient denies amaurosis fugax or recent TIA symptoms. There are no recent neurological changes noted. The patient denies history of DVT, PE or superficial thrombophlebitis. The patient denies recent episodes of angina or shortness of breath.   ABI Rt=1.24 and Lt=1.21  (previous ABI's Rt=1.07 and Lt=1.09) Duplex ultrasound of the bilateral tibial arteries reveals triphasic waveforms with good toe waveforms.   Review of Systems  Cardiovascular:       Claudication  All other systems reviewed and are negative.      Objective:   Physical Exam Vitals reviewed.  HENT:     Head: Normocephalic.  Cardiovascular:     Rate and Rhythm: Normal rate.     Pulses:          Dorsalis pedis pulses are 1+ on the right side and 1+ on the left side.       Posterior tibial pulses are 1+ on the right side and 1+ on the left side.  Pulmonary:     Effort: Pulmonary effort is normal.  Neurological:     Mental Status: He is alert and oriented to person, place, and time.  Psychiatric:        Mood and Affect: Mood normal.        Behavior: Behavior normal.        Thought Content: Thought content normal.        Judgment: Judgment normal.     BP 115/63 (BP Location: Right Arm)   Pulse 83   Resp 16   Wt 182 lb (82.6 kg)   BMI 27.67 kg/m   Past Medical History:  Diagnosis Date  . Alcohol use 03/16/2007   patient risk factors  . Allergic rhinitis, cause  unspecified   . Altered mental status   . Anxiety state, unspecified   . Cervicalgia 09/17/2007  . COPD (chronic obstructive pulmonary disease) ([place])   . Diabetes mellitus without complication ([place])   . Diplopia 12/21/2007  . Esophageal reflux   . Gastritis 05/13/2009  . HCAP (healthcare-associated pneumonia) 08/12/2014  . Hematuria 03/16/2007  . Hyperthyroidism    graves disease  . Iodine hypothyroidism   . Memory loss   . NSIP (nonspecific interstitial pneumonia) ([place])    on azathioprine  . Osteoporosis, unspecified    bone density per [facility] in 2012  . Other abnormal glucose 04/08/2008  . Other diseases of lung, not elsewhere classified 01/23/2009   s/p Fleming/pulmonology consult; intolerant  to Advair  . Peripheral vascular disease, unspecified ([place]) 12/24/2009  . Personal history of colonic polyps   . Pneumonia, organism unspecified(486)   . Pure hypercholesterolemia   . Shortness of breath   . Stroke ([place]) 2005  . Tobacco use disorder 03/16/2007   patient risk factors  . Unspecified hypothyroidism 01/07/2010  . 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 Marital status: Married  Spouse name: Not on file  . Number of children: 2  . Years of education: Not on file  . Highest education level: Not on file  Occupational History    Comment: works full time 40 hrs  Tobacco Use  . Smoking status: Former Smoker    Packs/day: 1.00    Years: 30.00    Pack years: 30.00  . Smokeless tobacco: Never Used  Substance and Sexual Activity  . Alcohol use: No  . Drug use: No  . Sexual activity: Never  Other Topics Concern  . Not on file  Social History Narrative   Marital status:  Married x 35 years.       Children:  Children ages 6, 20.   2 grandchildren (18, 3).      Lives: with wife.     Employment: disability since 2013.       Tobacco:  1/2 ppd x 40 years.      Alcohol: rarely.      Exercise:  walking the dogs daily.   Caffeine use: Carbonated beverages, Pepsi: Coffee, many servings a day Pepsi 3-4  Coffee 2 cups. Smoke detectors in home. Always wears seatbelts. Exercise: Inactive. No guns in the home.   Social Determinants of Health   Financial Resource Strain: Not on file  Food Insecurity: Not on file  Transportation Needs: Not on file  Physical Activity: Not on file  Stress: Not on file  Social Connections: Not on file  Intimate Partner Violence: Not on file    Past Surgical History:  Procedure Laterality Date  . Admission 11/2011     altered mental status/confusion with syncope.  CT head negative, MRI brain negative, EEG: diffuse slowing but no  seizure acitivity.  Labs negative.  [facility].  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 COLONOSCOPY WITH PROPOFOL N/A 03/10/2020   Procedure: COLONOSCOPY WITH PROPOFOL;  Surgeon: Jonathon Bellows, MD;  Location: [facility] ENDOSCOPY;  Service: Gastroenterology;  Laterality: N/A;  . EYE SURGERY  2010  . LAPAROSCOPIC REVISION VENTRICULAR-PERITONEAL (V-P) SHUNT Right 05/20/2014   Procedure: LAPAROSCOPIC REVISION VENTRICULAR-PERITONEAL (V-P) SHUNT;  Surgeon: Georganna Skeans, MD;  Location: [place] NEURO ORS;  Service: General;  Laterality: Right;  . LOWER EXTREMITY ANGIOGRAPHY Left 06/11/2020   Procedure: LOWER EXTREMITY ANGIOGRAPHY;  Surgeon: Algernon Huxley, MD;  Location: [place] CV LAB;  Service: Cardiovascular;  Laterality: Left;  . Neuropsychiatric evaluation  04/2012   poor short-term memory, poor recall, delayed reaction time; permanently disabled.      Family History  Problem Relation Age of Onset  . Heart disease Father        cad, chf  . Cancer Father   . Heart disease Brother 22       CABG  . Cancer Brother 71       pancreatic cancer  . Diabetes Brother   . Diabetes Brother   . Heart disease Brother 87       CABG  . Diabetes Brother   . Heart disease Brother 41       CABG  . Cancer Brother 73       prostate cancer   . Heart disease Mother        cad  . Diabetes Mother   . Stroke Mother     Allergies  Allergen Reactions  . Advair Diskus [Fluticasone-Salmeterol] Shortness Of Breath  . Erythromycin Nausea And Vomiting  . Iodinated Diagnostic Agents Shortness Of Breath and Other (See Comments)  .  Ativan [Lorazepam] Other (See Comments)    Caused pt's heart to stop  . Darvocet [Propoxyphene N-Acetaminophen] Nausea And Vomiting  . Doxycycline Hyclate Nausea And Vomiting, Swelling and Other (See Comments)    Tongue swelling, severe depression  . Propoxyphene Nausea And Vomiting  . Septra [Sulfamethoxazole-Trimethoprim] Rash    CBC Latest Ref Rng & Units 05/19/2019 05/16/2019 05/15/2019  WBC 4.0 - 10.5 K/uL 7.3 9.5 10.7(H)  Hemoglobin 13.0 - 17.0 g/dL 11.7(L) 14.1 14.9  Hematocrit 39.0 - 52.0 % 32.7(L) 40.8 43.2  Platelets 150 - 400 K/uL 357 294 265      CMP     Component Value Date/Time   NA 142 05/21/2019 0518   NA 142 01/24/2018 1100   NA 134 (L) 05/11/2014 1512   K 4.2 05/21/2019 0518   K 4.5 05/11/2014 1512   CL 104 05/21/2019 0518   CL 95 (L) 05/11/2014 1512   CO2 26 05/21/2019 0518   CO2 32 05/11/2014 1512   GLUCOSE 87 05/21/2019 0518   GLUCOSE 295 (H) 05/11/2014 1512   BUN 15 06/11/2020 0923   BUN 17 01/24/2018 1100   BUN 21 (H) 05/11/2014 1512   CREATININE 0.98 06/11/2020 0923   CREATININE 0.73 07/12/2016 1651   CALCIUM 9.2 05/21/2019 0518   CALCIUM 8.8 05/11/2014 1512   PROT 6.8 05/11/2019 1159   PROT 6.8 01/24/2018 1100   PROT 7.3 05/05/2014 2257   ALBUMIN 3.7 05/11/2019 1159   ALBUMIN 4.1 01/24/2018 1100   ALBUMIN 2.7 (L) 05/05/2014 2257   AST 21 05/11/2019 1159   AST 24 05/05/2014 2257   ALT 30 05/11/2019 1159   ALT 14 05/05/2014 2257   ALKPHOS 152 (H) 05/11/2019 1159   ALKPHOS 87 05/05/2014 2257   BILITOT 0.7 05/11/2019 1159   BILITOT 0.3 01/24/2018 1100   BILITOT 0.3 05/05/2014 2257   GFRNONAA >60 06/11/2020 0923   GFRNONAA >60 05/11/2014 1512   GFRAA >60  05/21/2019 0518   GFRAA >60 05/11/2014 1512     VAS [place] ABI WITH/WO TBI  Result Date: 11/27/2020 LOWER EXTREMITY DOPPLER STUDY Indications: Status Post Angioplasty with stent.  Vascular Interventions: 06/11/2020: Aortogram and Selective Left Lower Extremity                         Angiogram. Lefestream Stent placement to the Left Common                         Iliac Artery. Comparison Study: 07/14/2020 Performing Technologist: Charlane Ferretti RT (R)(VS)  Examination Guidelines: A complete evaluation includes at minimum, Doppler waveform signals and systolic blood pressure reading at the level of bilateral brachial, anterior tibial, and posterior tibial arteries, when vessel segments are accessible. Bilateral testing is considered an integral part of a complete examination. Photoelectric Plethysmograph (PPG) waveforms and toe systolic pressure readings are included as required and additional duplex testing as needed. Limited examinations for reoccurring indications may be performed as noted.  ABI Findings: +---------+------------------+-----+---------+--------+ Right    Rt Pressure (mmHg)IndexWaveform Comment  +---------+------------------+-----+---------+--------+ Brachial 122                                      +---------+------------------+-----+---------+--------+ ATA      153               1.24 triphasic         +---------+------------------+-----+---------+--------+  PTA      144               1.17 triphasic         +---------+------------------+-----+---------+--------+ Great Toe144               1.17 Normal            +---------+------------------+-----+---------+--------+ +---------+------------------+-----+---------+-------+ Left     Lt Pressure (mmHg)IndexWaveform Comment +---------+------------------+-----+---------+-------+ Brachial 123                                     +---------+------------------+-----+---------+-------+ ATA      148                1.20 triphasic        +---------+------------------+-----+---------+-------+ PTA      149               1.21 triphasic        +---------+------------------+-----+---------+-------+ Great Toe156               1.27 Normal           +---------+------------------+-----+---------+-------+ +-------+-----------+-----------+------------+------------+ ABI/TBIToday's ABIToday's TBIPrevious ABIPrevious TBI +-------+-----------+-----------+------------+------------+ Right  1.24       1.17       1.07        .83          +-------+-----------+-----------+------------+------------+ Left   1.21       1.27       1.09        1.04         +-------+-----------+-----------+------------+------------+ Bilateral ABIs appear essentially unchanged compared to prior study on 07/14/2020. Bilateral TBIs appear essentially unchanged compared to prior study on 07/14/2020.  Summary: Right: Resting right ankle-brachial index is within normal range. No evidence of significant right lower extremity arterial disease. The right toe-brachial index is normal. Left: Resting left ankle-brachial index is within normal range. No evidence of significant left lower extremity arterial disease. The left toe-brachial index is normal. *See table(s) above for measurements and observations.  Electronically signed by [name] MD on 11/27/2020 at 10:09:04 AM.   Final        Assessment & Plan:   1. Atherosclerosis of native artery of left lower extremity with intermittent claudication (HCC) Noninvasive studies.  Relatively normal today the patient continues to have issues with claudication-like symptoms including hip and buttock pain.  I had a discussion with the patient and his wife in regards to claudication in the differential.  Neurogenic versus vascular.  We will have the patient return for an aortoiliac duplex to see if there is any notable stenosis as the patient has previously had intervention within his iliac artery.  In  the event of no noticeable stenosis the evaluation of the lower back is advised.  2. Pure hypercholesterolemia Continue statin as ordered and reviewed, no changes at this time   3. Type 2 diabetes mellitus without complication, without long-term current use of insulin (HCC) Continue hypoglycemic medications as already ordered, these medications have been reviewed and there are no changes at this time.  Hgb A1C to be monitored as already arranged by primary service    Current Outpatient Medications on File Prior to Visit  Medication Sig Dispense Refill  . albuterol (VENTOLIN HFA) 108 (90 Base) MCG/ACT inhaler Inhale 2 puffs into the lungs every 6 (six) hours as needed for wheezing or shortness of breath. 18 g 0  .  aspirin EC 81 MG tablet Take 1 tablet (81 mg total) by mouth daily. 150 tablet 2  . atorvastatin (LIPITOR) 20 MG tablet Take 1 tablet (20 mg total) by mouth daily at 6 PM. 90 tablet 3  . Blood Glucose Monitoring Suppl (ACCU-CHEK GUIDE) w/Device KIT See admin instructions.    . clopidogrel (PLAVIX) 75 MG tablet Take 1 tablet (75 mg total) by mouth daily. 30 tablet 11  . diazepam (VALIUM) 5 MG tablet Take 5 mg by mouth 2 (two) times daily.     Marland Kitchen diltiazem (CARDIZEM CD) 120 MG 24 hr capsule Take by mouth daily.    . ESBRIET 267 MG TABS Take 3 tablets by mouth 3 (three) times daily as needed.    . metFORMIN (GLUCOPHAGE) 1000 MG tablet TAKE 1 TABLET BY MOUTH 2 TIMES DAILY WITH MEAL. (Patient taking differently: Take 1,000 mg by mouth 2 (two) times daily with a meal.) 180 tablet 0  . metoprolol succinate (TOPROL-XL) 25 MG 24 hr tablet Take by mouth.    . mirtazapine (REMERON) 15 MG tablet Take by mouth.    . morphine (MS CONTIN) 15 MG 12 hr tablet Take 1 tablet (15 mg total) by mouth every 8 (eight) hours.  0  . omeprazole (PRILOSEC) 20 MG capsule Take 1 capsule by mouth daily.    . predniSONE (DELTASONE) 10 MG tablet Take 5 mg by mouth every evening.     . thyroid (ARMOUR) 90 MG  tablet Take 120 mg by mouth daily at 2 PM.     . venlafaxine XR (EFFEXOR-XR) 150 MG 24 hr capsule Take 1 capsule (150 mg total) by mouth daily with breakfast. 90 capsule 1  . Vitamin D, Ergocalciferol, (DRISDOL) 1.25 MG (50000 UNIT) CAPS capsule Take by mouth.    Marland Kitchen alendronate (FOSAMAX) 70 MG tablet Take by mouth. (Patient not taking: No sig reported)    . CHANTIX 1 MG tablet Take 1 mg by mouth 2 (two) times daily.  (Patient not taking: No sig reported)    . Cholecalciferol 1.25 MG (50000 UT) capsule Take by mouth. (Patient not taking: No sig reported)    . Ensure Max Protein (ENSURE MAX PROTEIN) LIQD Take 330 mLs (11 oz total) by mouth 2 (two) times daily. (Patient not taking: No sig reported) 330 mL 0  . famotidine (PEPCID) 20 MG tablet Take by mouth.    . linagliptin (TRADJENTA) 5 MG TABS tablet Take 1 tablet (5 mg total) by mouth daily. (Patient not taking: No sig reported) 30 tablet 0  . polyethylene glycol (MIRALAX / GLYCOLAX) 17 g packet Take 17 g by mouth daily. (Patient not taking: No sig reported) 30 each 0  . predniSONE (DELTASONE) 10 MG tablet Take 20 mg by mouth daily with breakfast.  (Patient not taking: No sig reported)    . rOPINIRole (REQUIP) 0.5 MG tablet Take by mouth.    . sitaGLIPtin (JANUVIA) 100 MG tablet Take by mouth.    . tapentadol HCl (NUCYNTA) 75 MG tablet Take 75 mg by mouth every 4 (four) hours as needed for moderate pain or severe pain.  (Patient not taking: No sig reported)    . thiamine 100 MG tablet Take 1 tablet (100 mg total) by mouth daily. (Patient not taking: No sig reported) 30 tablet 0   No current facility-administered medications on file prior to visit.    There are no Patient Instructions on file for this visit. No follow-ups on file.   Kris Hartmann, NP

## 2020-12-13 DIAGNOSIS — F5104 Psychophysiologic insomnia: Secondary | ICD-10-CM | POA: Insufficient documentation

## 2020-12-17 ENCOUNTER — Encounter (INDEPENDENT_AMBULATORY_CARE_PROVIDER_SITE_OTHER): Payer: Medicare HMO

## 2020-12-17 ENCOUNTER — Ambulatory Visit (INDEPENDENT_AMBULATORY_CARE_PROVIDER_SITE_OTHER): Payer: Medicare HMO | Admitting: Nurse Practitioner

## 2020-12-18 DIAGNOSIS — J9611 Chronic respiratory failure with hypoxia: Secondary | ICD-10-CM | POA: Insufficient documentation

## 2021-02-17 ENCOUNTER — Ambulatory Visit: Payer: Self-pay | Admitting: Gastroenterology

## 2021-02-17 ENCOUNTER — Other Ambulatory Visit: Payer: Self-pay

## 2021-02-23 ENCOUNTER — Other Ambulatory Visit: Payer: Self-pay

## 2021-02-24 ENCOUNTER — Ambulatory Visit: Payer: Medicare HMO | Admitting: Gastroenterology

## 2021-02-24 ENCOUNTER — Other Ambulatory Visit: Payer: Self-pay

## 2021-02-24 ENCOUNTER — Encounter: Payer: Self-pay | Admitting: Gastroenterology

## 2021-02-24 VITALS — BP 102/47 | HR 86 | Temp 97.7°F | Ht 69.0 in | Wt 183.0 lb

## 2021-02-24 DIAGNOSIS — D509 Iron deficiency anemia, unspecified: Secondary | ICD-10-CM | POA: Diagnosis not present

## 2021-02-24 DIAGNOSIS — Z1211 Encounter for screening for malignant neoplasm of colon: Secondary | ICD-10-CM

## 2021-02-24 MED ORDER — NA SULFATE-K SULFATE-MG SULF 17.5-3.13-1.6 GM/177ML PO SOLN: 354.0000 mL | Freq: Once | ORAL | 0 refills | Status: AC

## 2021-02-24 MED ORDER — MAGNESIUM CITRATE PO SOLN: 1.0000 | Freq: Once | ORAL | 0 refills | Status: AC

## 2021-02-24 NOTE — Progress Notes (Signed)
Jonathon Bellows MD, MRCP(U.K) [address]  [place], [place] [address]  Main: [phone]  Fax: [phone]   Gastroenterology Consultation  Referring Provider:     [name], MD Primary Care Physician:  [name], MD Primary Gastroenterologist:  Dr. Jonathon Bellows  Reason for Consultation:     Anemia        HPI:   Kirk Robinson is a 65 y.o. y/o male referred for consultation & management  by Dr. Tamala Julian, Renette Butters, MD.     He has been referred to see me for anemia.  Recent CBC in June 2021 demonstrates a hemoglobin of 10.5 g with an MCV of 81.  In April 2022 hemoglobin of 7.4 g with an MCV of 71.  Ferritin was 7 folate normal B12 544 and iron studies suggestive of iron deficiency anemia.  He was previously set up for a colonoscopy in 2021 but canceled the procedure.  His anemia had improved on iron tablets.  He is on a PPI for acid reflux.  He denies any nasal bleeds, blood in the stool or blood in the urine.  Has noticed some change in the shape of his stool.  Recollects last colonoscopy was over 5 years back and polyps were noted.  Denies any NSAID use.  He is on Plavix.  He uses an oxygen concentrator.  He has interstitial lung disease and COPD per his history.  Past Medical History:  Diagnosis Date   Alcohol use 03/16/2007   patient risk factors   Allergic rhinitis, cause unspecified    Altered mental status    Anxiety state, unspecified    Cervicalgia 09/17/2007   COPD (chronic obstructive pulmonary disease) (HCC)    Diabetes mellitus without complication ([place])    Diplopia 12/21/2007   Esophageal reflux    Gastritis 05/13/2009   HCAP (healthcare-associated pneumonia) 08/12/2014   Hematuria 03/16/2007   Hyperthyroidism    graves disease   Iodine hypothyroidism    Memory loss    NSIP (nonspecific interstitial pneumonia) ([place])    on azathioprine   Osteoporosis, unspecified    bone density per Morivati in 2012   Other abnormal glucose 04/08/2008    Other diseases of lung, not elsewhere classified 01/23/2009   s/p Fleming/pulmonology consult; intolerant  to Advair   Peripheral vascular disease, unspecified ([place]) 12/24/2009   Personal history of colonic polyps    Pneumonia, organism unspecified(486)    Pure hypercholesterolemia    Shortness of breath    Stroke ([place]) 2005   Tobacco use disorder 03/16/2007   patient risk factors   Unspecified hypothyroidism 01/07/2010   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  CT head negative, MRI brain negative, EEG: diffuse slowing but no  seizure acitivity.  Labs negative.  [facility].   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   COLONOSCOPY WITH PROPOFOL N/A 03/10/2020   Procedure: COLONOSCOPY WITH PROPOFOL;  Surgeon: Jonathon Bellows, MD;  Location: [facility] ENDOSCOPY;  Service: Gastroenterology;  Laterality: N/A;   EYE SURGERY  2010   LAPAROSCOPIC REVISION VENTRICULAR-PERITONEAL (V-P) SHUNT Right 05/20/2014   Procedure: LAPAROSCOPIC REVISION VENTRICULAR-PERITONEAL (V-P) SHUNT;  Surgeon: Georganna Skeans, MD;  Location: [facility];  Service: General;  Laterality: Right;   LOWER EXTREMITY ANGIOGRAPHY Left 06/11/2020   Procedure: LOWER EXTREMITY ANGIOGRAPHY;  Surgeon: Algernon Huxley,  MD;  Location: [facility];  Service: Cardiovascular;  Laterality: Left;   Neuropsychiatric evaluation  04/2012   poor short-term memory, poor recall, delayed reaction time; permanently disabled.      Prior to Admission medications   Medication Sig Start Date End Date Taking? Authorizing Provider  albuterol (VENTOLIN HFA) 108 (90 Base) MCG/ACT inhaler Inhale 2 puffs into the lungs every 6 (six) hours as needed for wheezing or shortness of breath. 05/21/19  Yes Loletha Grayer, MD  ARMOUR THYROID 120 MG tablet Take 120 mg by mouth every morning. 02/17/21  Yes [provider]  aspirin EC 81 MG tablet Take 1 tablet (81 mg total) by mouth daily. 06/11/20  Yes Dew, Erskine Squibb, MD  atorvastatin (LIPITOR) 20 MG tablet Take 1 tablet (20 mg total) by mouth daily at 6 PM. 10/24/17  Yes [name], MD  Blood Glucose Monitoring Suppl (ACCU-CHEK GUIDE) w/Device KIT See admin instructions. 05/22/19  Yes [provider]  clobetasol ointment (TEMOVATE) 0.05 % Apply topically 2 (two) times daily. 01/12/21  Yes [provider]  clopidogrel (PLAVIX) 75 MG tablet Take 1 tablet (75 mg total) by mouth daily. 06/11/20  Yes Dew, Erskine Squibb, MD  diazepam (VALIUM) 5 MG tablet Take 5 mg by mouth 2 (two) times daily.  04/25/14  Yes [provider]  diltiazem (CARDIZEM CD) 120 MG 24 hr capsule Take by mouth daily. 05/06/20  Yes [provider]  Docusate Sodium 100 MG capsule Take 1 tablet by mouth in the morning, at noon, and at bedtime.   Yes [provider]  hydrocortisone 2.5 % ointment Apply topically 2 (two) times daily. 12/07/20  Yes [provider]  linagliptin (TRADJENTA) 5 MG TABS tablet Take 1 tablet (5 mg total) by mouth daily. 05/21/19  Yes Wieting, Richard, MD  metFORMIN (GLUCOPHAGE) 1000 MG tablet TAKE 1 TABLET BY MOUTH 2 TIMES DAILY WITH MEAL. Patient taking differently: Take 1,000 mg by mouth 2 (two) times daily with a meal. 03/16/18  Yes [name], MD  metoprolol succinate (TOPROL-XL) 25 MG 24 hr tablet Take by mouth. 05/01/20 05/01/21 Yes [provider]  mirtazapine (REMERON) 15 MG tablet Take by mouth. 01/14/20 04/08/21 Yes [provider]  morphine (MS CONTIN) 30 MG 12 hr tablet SMARTSIG:1 Tablet(s) By Mouth Every 12 Hours 02/05/21  Yes [provider]  pantoprazole (PROTONIX) 40 MG tablet Take by mouth. 02/09/21 02/09/22 Yes [provider]  predniSONE (DELTASONE) 10 MG tablet Take 5 mg by mouth every evening.  11/13/17  Yes [provider]  rOPINIRole (REQUIP) 0.5 MG tablet  Take by mouth. 09/23/19 02/24/21 Yes [provider]  sitaGLIPtin (JANUVIA) 100 MG tablet Take by mouth. 05/27/19 02/24/21 Yes [provider]  venlafaxine XR (EFFEXOR-XR) 150 MG 24 hr capsule Take 1 capsule (150 mg total) by mouth daily with breakfast. 01/24/18  Yes [name], MD  Vitamin D, Ergocalciferol, (DRISDOL) 1.25 MG (50000 UNIT) CAPS capsule Take by mouth. 07/09/20  Yes [provider]  alendronate (FOSAMAX) 70 MG tablet Take by mouth. Patient not taking: No sig reported 04/02/20   [provider]  Ensure Max Protein (ENSURE MAX PROTEIN) LIQD Take 330 mLs (11 oz total) by mouth 2 (two) times daily. Patient not taking: No sig reported 05/21/19   Loletha Grayer, MD  ESBRIET 267 MG TABS Take 3 tablets by mouth 3 (three) times daily as needed. 01/23/20   [provider]  famotidine (PEPCID) 20 MG  tablet Take by mouth. 05/29/19 06/11/20  [provider]  polyethylene glycol (MIRALAX / GLYCOLAX) 17 g packet Take 17 g by mouth daily. Patient not taking: No sig reported 05/21/19   Loletha Grayer, MD  predniSONE (DELTASONE) 10 MG tablet Take 20 mg by mouth daily with breakfast.  Patient not taking: No sig reported    [provider]  tapentadol HCl (NUCYNTA) 75 MG tablet Take 75 mg by mouth every 4 (four) hours as needed for moderate pain or severe pain.  Patient not taking: No sig reported    [provider]  thiamine 100 MG tablet Take 1 tablet (100 mg total) by mouth daily. Patient not taking: No sig reported 05/21/19   Loletha Grayer, MD    Family History  Problem Relation Age of Onset   Heart disease Father        cad, chf   Cancer Father    Heart disease Brother 68       CABG   Cancer Brother 49       pancreatic cancer   Diabetes Brother    Diabetes Brother    Heart disease Brother 58       CABG   Diabetes Brother    Heart disease Brother 4       CABG   Cancer Brother 97       prostate cancer   Heart  disease Mother        cad   Diabetes Mother    Stroke Mother      Social History   Tobacco Use   Smoking status: Former    Packs/day: 1.00    Years: 30.00    Pack years: 30.00    Types: Cigarettes   Smokeless tobacco: Never  Substance Use Topics   Alcohol use: No   Drug use: No    Allergies as of 02/24/2021 - Review Complete 02/24/2021  Allergen Reaction Noted   Advair diskus [fluticasone-salmeterol] Shortness Of Breath 04/04/2012   Erythromycin Nausea And Vomiting 03/16/2007   Iodinated diagnostic agents Shortness Of Breath and Other (See Comments) 04/08/2014   Ativan [lorazepam] Other (See Comments) 07/12/2016   Darvocet [propoxyphene n-acetaminophen] Nausea And Vomiting 04/16/2012   Doxycycline hyclate Nausea And Vomiting, Swelling, and Other (See Comments) 04/04/2012   Propoxyphene Nausea And Vomiting 04/16/2012   Septra [sulfamethoxazole-trimethoprim] Rash 04/16/2012    Review of Systems:    All systems reviewed and negative except where noted in HPI.   Physical Exam:  BP (!) 102/47 (BP Location: Left Arm, Patient Position: Sitting, Cuff Size: Normal)   Pulse 86   Temp 97.7 F (36.5 C) (Oral)   Ht 5' 9" (1.753 m)   Wt 183 lb (83 kg)   BMI 27.02 kg/m  No LMP for male patient. Psych:  Alert and cooperative. Normal mood and affect. General:   Alert,  Well-developed, well-nourished, pleasant and cooperative in NAD Head:  Normocephalic and atraumatic. Eyes:  Sclera clear, no icterus.   Conjunctiva pink. Neck:  Supple; no masses or thyromegaly. Lungs: Decreased air entry bilaterally but equal.  Fine end inspiratory crepitations at bilateral lung bases noted.  Expiratory rhonchi noted.   Heart:  Regular rate and rhythm; no murmurs, clicks, rubs, or gallops. Abdomen:  Normal bowel sounds.  No bruits.  Soft, non-tender and non-distended without masses, hepatosplenomegaly or hernias noted.  No guarding or rebound tenderness.    Neurologic:  Alert and oriented x3;    Psych:  Alert and cooperative. Normal mood and  affect.  Imaging Studies: No results found.  Assessment and Plan:   Kirk Robinson is a 65 y.o. y/o male has been referred for severe iron deficiency anemia.  No prior GI evaluation.  He is high risk for GI evaluation as he is on oxygen concentration long-term has interstitial lung disease.  Plan 1.  Check H. pylori breath test, celiac serology and urine analysis as part of evaluation for iron deficiency anemia 2.  EGD and colonoscopy to be scheduled within 1 to 2 weeks.  If negative will schedule capsule study of the small bowel to complete evaluation. 3.  He is on Plavix and will need Plavix holding instructions 4.  He will need pulmonary clearance due to his history of lung disease to discuss safety of anesthesia.  I have discussed alternative options, risks & benefits,  which include, but are not limited to, bleeding, infection, perforation,respiratory complication & drug reaction.  The patient agrees with this plan & written consent will be obtained.     Follow up in 8 to 12-week  Dr Jonathon Bellows MD,MRCP(U.K)

## 2021-03-01 LAB — CELIAC DISEASE AB SCREEN W/RFX
Antigliadin Abs, IgA: 2 units (ref 0–19)
IgA/Immunoglobulin A, Serum: 114 mg/dL (ref 61–437)
Transglutaminase IgA: <2 U/mL (ref 0–3)

## 2021-03-01 LAB — H. PYLORI BREATH TEST: H pylori Breath Test: NEGATIVE

## 2021-03-02 ENCOUNTER — Encounter: Payer: Self-pay | Admitting: Gastroenterology

## 2021-03-02 ENCOUNTER — Telehealth: Payer: Self-pay

## 2021-03-02 ENCOUNTER — Other Ambulatory Visit: Payer: Self-pay | Admitting: Gastroenterology

## 2021-03-02 NOTE — Telephone Encounter (Signed)
Called patient but spoke to patient's daughter Janett Billow since he was not home. I told Janett Billow that her dad is to hold his Plavix 5 dys prior to procedure and restart it 2 days after procedure. She stated that she was witting the instructions down and that she would let her father know. Janett Billow had no further questions for me.

## 2021-03-03 LAB — URINALYSIS
Bilirubin, UA: NEGATIVE
Glucose, UA: NEGATIVE
Ketones, UA: NEGATIVE
Leukocytes,UA: NEGATIVE
Nitrite, UA: NEGATIVE
Protein,UA: NEGATIVE
RBC, UA: NEGATIVE
Specific Gravity, UA: 1.014 (ref 1.005–1.030)
Urobilinogen, Ur: 0.2 mg/dL (ref 0.2–1.0)
pH, UA: 6 (ref 5.0–7.5)

## 2021-03-12 ENCOUNTER — Encounter: Payer: Self-pay | Admitting: Gastroenterology

## 2021-03-15 ENCOUNTER — Telehealth: Payer: Self-pay | Admitting: Gastroenterology

## 2021-03-15 NOTE — Telephone Encounter (Signed)
Patient was contacted by Almyra Free, RN and patient is ready to have his colonoscopy again tomorrow. Patient is to pick up his prep later on today.

## 2021-03-15 NOTE — Telephone Encounter (Signed)
Wife stated that the patient wasn't cleaned out enough this morning and wants to reschedule preocedure, per wife. Clinical staff will follow up with patient.

## 2021-03-16 ENCOUNTER — Ambulatory Visit: Payer: Medicare HMO | Admitting: Anesthesiology

## 2021-03-16 ENCOUNTER — Ambulatory Visit
Admission: RE | Admit: 2021-03-16 | Discharge: 2021-03-16 | Disposition: A | Discharge status: Home / Self Care | Payer: Medicare HMO | Attending: Gastroenterology | Admitting: Gastroenterology

## 2021-03-16 ENCOUNTER — Encounter: Payer: Self-pay | Admitting: Gastroenterology

## 2021-03-16 ENCOUNTER — Encounter
Admission: RE | Disposition: A | Discharge status: Home / Self Care | Payer: Self-pay | Source: Home / Self Care | Attending: Gastroenterology

## 2021-03-16 ENCOUNTER — Other Ambulatory Visit: Payer: Self-pay

## 2021-03-16 DIAGNOSIS — K2970 Gastritis, unspecified, without bleeding: Secondary | ICD-10-CM | POA: Diagnosis not present

## 2021-03-16 DIAGNOSIS — Z7984 Long term (current) use of oral hypoglycemic drugs: Secondary | ICD-10-CM | POA: Diagnosis not present

## 2021-03-16 DIAGNOSIS — Z9981 Dependence on supplemental oxygen: Secondary | ICD-10-CM | POA: Insufficient documentation

## 2021-03-16 DIAGNOSIS — Z79890 Hormone replacement therapy (postmenopausal): Secondary | ICD-10-CM | POA: Insufficient documentation

## 2021-03-16 DIAGNOSIS — E039 Hypothyroidism, unspecified: Secondary | ICD-10-CM | POA: Insufficient documentation

## 2021-03-16 DIAGNOSIS — Z882 Allergy status to sulfonamides status: Secondary | ICD-10-CM | POA: Diagnosis not present

## 2021-03-16 DIAGNOSIS — K3189 Other diseases of stomach and duodenum: Secondary | ICD-10-CM | POA: Diagnosis not present

## 2021-03-16 DIAGNOSIS — E1151 Type 2 diabetes mellitus with diabetic peripheral angiopathy without gangrene: Secondary | ICD-10-CM | POA: Diagnosis not present

## 2021-03-16 DIAGNOSIS — Z79899 Other long term (current) drug therapy: Secondary | ICD-10-CM | POA: Diagnosis not present

## 2021-03-16 DIAGNOSIS — Z87891 Personal history of nicotine dependence: Secondary | ICD-10-CM | POA: Insufficient documentation

## 2021-03-16 DIAGNOSIS — K635 Polyp of colon: Secondary | ICD-10-CM | POA: Diagnosis not present

## 2021-03-16 DIAGNOSIS — D122 Benign neoplasm of ascending colon: Secondary | ICD-10-CM | POA: Insufficient documentation

## 2021-03-16 DIAGNOSIS — D120 Benign neoplasm of cecum: Secondary | ICD-10-CM | POA: Insufficient documentation

## 2021-03-16 DIAGNOSIS — Z7902 Long term (current) use of antithrombotics/antiplatelets: Secondary | ICD-10-CM | POA: Diagnosis not present

## 2021-03-16 DIAGNOSIS — Z800 Family history of malignant neoplasm of digestive organs: Secondary | ICD-10-CM | POA: Insufficient documentation

## 2021-03-16 DIAGNOSIS — Z7951 Long term (current) use of inhaled steroids: Secondary | ICD-10-CM | POA: Insufficient documentation

## 2021-03-16 DIAGNOSIS — D125 Benign neoplasm of sigmoid colon: Secondary | ICD-10-CM | POA: Insufficient documentation

## 2021-03-16 DIAGNOSIS — D509 Iron deficiency anemia, unspecified: Secondary | ICD-10-CM | POA: Insufficient documentation

## 2021-03-16 DIAGNOSIS — Z91041 Radiographic dye allergy status: Secondary | ICD-10-CM | POA: Insufficient documentation

## 2021-03-16 DIAGNOSIS — Z888 Allergy status to other drugs, medicaments and biological substances status: Secondary | ICD-10-CM | POA: Diagnosis not present

## 2021-03-16 DIAGNOSIS — Z823 Family history of stroke: Secondary | ICD-10-CM | POA: Insufficient documentation

## 2021-03-16 DIAGNOSIS — Z7952 Long term (current) use of systemic steroids: Secondary | ICD-10-CM | POA: Insufficient documentation

## 2021-03-16 DIAGNOSIS — Z1211 Encounter for screening for malignant neoplasm of colon: Secondary | ICD-10-CM

## 2021-03-16 DIAGNOSIS — Z8042 Family history of malignant neoplasm of prostate: Secondary | ICD-10-CM | POA: Insufficient documentation

## 2021-03-16 DIAGNOSIS — Z833 Family history of diabetes mellitus: Secondary | ICD-10-CM | POA: Insufficient documentation

## 2021-03-16 DIAGNOSIS — Z881 Allergy status to other antibiotic agents status: Secondary | ICD-10-CM | POA: Diagnosis not present

## 2021-03-16 DIAGNOSIS — Z8249 Family history of ischemic heart disease and other diseases of the circulatory system: Secondary | ICD-10-CM | POA: Insufficient documentation

## 2021-03-16 HISTORY — PX: ESOPHAGOGASTRODUODENOSCOPY (EGD) WITH PROPOFOL: SHX5813

## 2021-03-16 HISTORY — PX: COLONOSCOPY WITH PROPOFOL: SHX5780

## 2021-03-16 LAB — GLUCOSE, CAPILLARY: Glucose-Capillary: 85 mg/dL (ref 70–99)

## 2021-03-16 SURGERY — COLONOSCOPY WITH PROPOFOL
Anesthesia: General

## 2021-03-16 MED ORDER — SODIUM CHLORIDE 0.9 % IV SOLN: INTRAVENOUS | Status: DC

## 2021-03-16 MED ORDER — GLYCOPYRROLATE 0.2 MG/ML IJ SOLN
INTRAMUSCULAR | Status: DC | PRN
  Administered 2021-03-16: .2 mg via INTRAVENOUS

## 2021-03-16 MED ORDER — LIDOCAINE HCL (CARDIAC) PF 100 MG/5ML IV SOSY
PREFILLED_SYRINGE | INTRAVENOUS | Status: DC | PRN
  Administered 2021-03-16: 100 mg via INTRAVENOUS

## 2021-03-16 MED ORDER — PROPOFOL 500 MG/50ML IV EMUL
INTRAVENOUS | Status: DC | PRN
Start: 2021-03-16 — End: 2021-03-16
  Administered 2021-03-16: 145 ug/kg/min via INTRAVENOUS

## 2021-03-16 MED ORDER — PROPOFOL 10 MG/ML IV BOLUS
INTRAVENOUS | Status: DC | PRN
  Administered 2021-03-16: 60 mg via INTRAVENOUS

## 2021-03-16 NOTE — Op Note (Signed)
[facility] Gastroenterology Patient Name: Kirk Robinson Procedure Date: 03/16/2021 8:10 AM MRN: [id number] Account #: [id number] Date of Birth: 1956/06/30 Admit Type: Outpatient Age: 65 Room: [facility] ENDO ROOM 2 Gender: Male Note Status: Finalized Procedure:             Colonoscopy Indications:           Iron deficiency anemia Providers:             Jonathon Bellows MD, MD Referring MD:          Renette Butters. Tamala Julian, MD (Referring MD) Medicines:             Monitored Anesthesia Care Complications:         No immediate complications. Procedure:             Pre-Anesthesia Assessment:                        - Prior to the procedure, a History and Physical was                         performed, and patient medications, allergies and                         sensitivities were reviewed. The patient's tolerance                         of previous anesthesia was reviewed.                        - The risks and benefits of the procedure and the                         sedation options and risks were discussed with the                         patient. All questions were answered and informed                         consent was obtained.                        - ASA Grade Assessment: III - A patient with severe                         systemic disease.                        After obtaining informed consent, the colonoscope was                         passed under direct vision. Throughout the procedure,                         the patient's blood pressure, pulse, and oxygen                         saturations were monitored continuously. The                         Colonoscope was introduced through the anus and  advanced to the the cecum, identified by the                         appendiceal orifice. The colonoscopy was performed                         with ease. The patient tolerated the procedure well.                         The quality of the bowel  preparation was poor. Findings:      The perianal and digital rectal examinations were normal.      Three sessile polyps were found in the sigmoid colon, ascending colon       and cecum. The polyps were 5 to 7 mm in size. These polyps were removed       with a cold snare. Resection and retrieval were complete.      The exam was otherwise without abnormality. Impression:            - Preparation of the colon was poor.                        - Three 5 to 7 mm polyps in the sigmoid colon, in the                         ascending colon and in the cecum, removed with a cold                         snare. Resected and retrieved.                        - The examination was otherwise normal. Recommendation:        - Discharge patient to home (with escort).                        - Resume previous diet.                        - Continue present medications.                        - Await pathology results.                        - Repeat colonoscopy in 4 months because the bowel                         preparation was suboptimal.                        - To visualize the small bowel, perform video capsule                         endoscopy in 2 weeks.                        - Return to my office as previously scheduled. Procedure Code(s):     --- Professional ---  45385, Colonoscopy, flexible; with removal of                         tumor(s), polyp(s), or other lesion(s) by snare                         technique Diagnosis Code(s):     --- Professional ---                        K63.5, Polyp of colon                        D50.9, Iron deficiency anemia, unspecified CPT copyright 2019 American Medical Association. All rights reserved. The codes documented in this report are preliminary and upon coder review may  be revised to meet current compliance requirements. Jonathon Bellows, MD Jonathon Bellows MD, MD 03/16/2021 8:49:38 AM This report has been signed electronically. Number of  Addenda: 0 Note Initiated On: 03/16/2021 8:10 AM Scope Withdrawal Time: 0 hours 10 minutes 54 seconds  Total Procedure Duration: 0 hours 20 minutes 13 seconds  Estimated Blood Loss:  Estimated blood loss: none.      [facility]

## 2021-03-16 NOTE — Anesthesia Preprocedure Evaluation (Signed)
Anesthesia Evaluation  Patient identified by MRN, date of birth, ID band Patient awake    Reviewed: Allergy & Precautions, NPO status , Patient's Chart, lab work & pertinent test results  History of Anesthesia Complications Negative for: history of anesthetic complications  Airway Mallampati: II  TM Distance: >3 FB Neck ROM: Full    Dental  (+) Poor Dentition, Missing   Pulmonary neg sleep apnea, COPD,  COPD inhaler and oxygen dependent, former smoker,    breath sounds clear to auscultation- rhonchi (-) wheezing      Cardiovascular (-) hypertension+ Peripheral Vascular Disease  (-) CAD, (-) Past MI, (-) Cardiac Stents and (-) CABG  Rhythm:Regular Rate:Normal - Systolic murmurs and - Diastolic murmurs    Neuro/Psych PSYCHIATRIC DISORDERS Anxiety Depression CVA (hx of hemorrhagic stroke, has shunt)    GI/Hepatic Neg liver ROS, GERD  ,  Endo/Other  diabetes, Oral Hypoglycemic AgentsHypothyroidism   Renal/GU negative Renal ROS     Musculoskeletal  (+) Arthritis ,   Abdominal (+) - obese,   Peds  Hematology negative hematology ROS (+)   Anesthesia Other Findings Past Medical History: 03/16/2007: Alcohol use     Comment:  patient risk factors No date: Allergic rhinitis, cause unspecified No date: Altered mental status No date: Anxiety state, unspecified 09/17/2007: Cervicalgia No date: COPD (chronic obstructive pulmonary disease) (HCC) No date: Diabetes mellitus without complication ([place]) [id number]: Diplopia No date: Esophageal reflux 05/13/2009: Gastritis 08/12/2014: HCAP (healthcare-associated pneumonia) 03/16/2007: Hematuria No date: Hyperthyroidism     Comment:  graves disease No date: Iodine hypothyroidism No date: Memory loss No date: NSIP (nonspecific interstitial pneumonia) ([place])     Comment:  on azathioprine No date: Osteoporosis, unspecified     Comment:  bone density per Morivati in  2012 04/08/2008: Other abnormal glucose 01/23/2009: Other diseases of lung, not elsewhere classified     Comment:  s/p Fleming/pulmonology consult; intolerant  to Advair 12/24/2009: Peripheral vascular disease, unspecified ([place]) No date: Personal history of colonic polyps No date: Pneumonia, organism unspecified(486) No date: Pure hypercholesterolemia No date: Shortness of breath 2005: Stroke ([place]) 03/16/2007: Tobacco use disorder     Comment:  patient risk factors 01/07/2010: Unspecified hypothyroidism 06/08/2007: Unspecified late effects of cerebrovascular disease   Reproductive/Obstetrics                             Anesthesia Physical Anesthesia Plan  ASA: 4  Anesthesia Plan: General   Post-op Pain Management:    Induction: Intravenous  PONV Risk Score and Plan: 1 and Propofol infusion  Airway Management Planned: Natural Airway  Additional Equipment:   Intra-op Plan:   Post-operative Plan:   Informed Consent: I have reviewed the patients History and Physical, chart, labs and discussed the procedure including the risks, benefits and alternatives for the proposed anesthesia with the patient or authorized representative who has indicated his/her understanding and acceptance.     Dental advisory given  Plan Discussed with: CRNA and Anesthesiologist  Anesthesia Plan Comments:         Anesthesia Quick Evaluation

## 2021-03-16 NOTE — Op Note (Signed)
[facility] Gastroenterology Patient Name: Kirk Robinson Procedure Date: 03/16/2021 8:12 AM MRN: [id number] Account #: [id number] Date of Birth: [birth year] Admit Type: Outpatient Age: 65 Room: [facility] ENDO ROOM 2 Gender: Male Note Status: Finalized Procedure:             Upper GI endoscopy Indications:           Iron deficiency anemia Providers:             Jonathon Bellows MD, MD Referring MD:          Renette Butters. Tamala Julian, MD (Referring MD) Medicines:             Monitored Anesthesia Care Complications:         No immediate complications. Procedure:             Pre-Anesthesia Assessment:                        - Prior to the procedure, a History and Physical was                         performed, and patient medications, allergies and                         sensitivities were reviewed. The patient's tolerance                         of previous anesthesia was reviewed.                        - The risks and benefits of the procedure and the                         sedation options and risks were discussed with the                         patient. All questions were answered and informed                         consent was obtained.                        - ASA Grade Assessment: III - A patient with severe                         systemic disease.                        After obtaining informed consent, the endoscope was                         passed under direct vision. Throughout the procedure,                         the patient's blood pressure, pulse, and oxygen                         saturations were monitored continuously. The                         Endosonoscope was introduced through the mouth,  and                         advanced to the third part of duodenum. The upper GI                         endoscopy was accomplished with ease. The patient                         tolerated the procedure well. Findings:      The esophagus was normal.      Localized mild  inflammation characterized by congestion (edema) and       erythema was found in the gastric antrum. Biopsies were taken with a       cold forceps for histology.      Patchy mild mucosal changes characterized by smoothness and altered       texture were found in the duodenal bulb. Biopsies were taken with a cold       forceps for histology.      The cardia and gastric fundus were normal on retroflexion. Impression:            - Normal esophagus.                        - Gastritis. Biopsied.                        - Mucosal changes in the duodenum. Biopsied. Recommendation:        - Await pathology results.                        - Perform a colonoscopy today. Procedure Code(s):     --- Professional ---                        [phone], Esophagogastroduodenoscopy, flexible,                         transoral; with biopsy, single or multiple Diagnosis Code(s):     --- Professional ---                        K29.70, Gastritis, unspecified, without bleeding                        K31.89, Other diseases of stomach and duodenum                        D50.9, Iron deficiency anemia, unspecified CPT copyright 2019 American Medical Association. All rights reserved. The codes documented in this report are preliminary and upon coder review may  be revised to meet current compliance requirements. Jonathon Bellows, MD Jonathon Bellows MD, MD 03/16/2021 8:24:32 AM This report has been signed electronically. Number of Addenda: 0 Note Initiated On: 03/16/2021 8:12 AM Estimated Blood Loss:  Estimated blood loss: none.      [facility]

## 2021-03-16 NOTE — H&P (Signed)
Jonathon Bellows, MD [address], [place], [place], [place], [address] [address], [place], [place], [place], [address] Phone: [phone]  Fax: [phone]  Primary Care Physician:  [name], MD   Pre-Procedure History & Physical: HPI:  Kirk Robinson is a 65 y.o. male is here for an endoscopy and colonoscopy    Past Medical History:  Diagnosis Date   Alcohol use 03/16/2007   patient risk factors   Allergic rhinitis, cause unspecified    Altered mental status    Anxiety state, unspecified    Cervicalgia 09/17/2007   COPD (chronic obstructive pulmonary disease) ([place])    Diabetes mellitus without complication ([place])    Diplopia 12/21/2007   Esophageal reflux    Gastritis 05/13/2009   HCAP (healthcare-associated pneumonia) 08/12/2014   Hematuria 03/16/2007   Hyperthyroidism    graves disease   Iodine hypothyroidism    Memory loss    NSIP (nonspecific interstitial pneumonia) ([place])    on azathioprine   Osteoporosis, unspecified    bone density per [facility] in 2012   Other abnormal glucose 04/08/2008   Other diseases of lung, not elsewhere classified 01/23/2009   s/p [place]/pulmonology consult; intolerant  to Advair   Peripheral vascular disease, unspecified ([place]) 12/24/2009   Personal history of colonic polyps    Pneumonia, organism unspecified([facility])    Pure hypercholesterolemia    Shortness of breath    Stroke ([place]) 2005   Tobacco use disorder 03/16/2007   patient risk factors   Unspecified hypothyroidism 01/07/2010   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  CT head negative, MRI brain negative, EEG: diffuse slowing but no  seizure acitivity.  Labs negative.  [facility].   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 ENDOSCOPY;  Service: Gastroenterology;  Laterality: N/A;   EYE SURGERY  2010   LAPAROSCOPIC REVISION VENTRICULAR-PERITONEAL (V-P) SHUNT Right 05/20/2014   Procedure: LAPAROSCOPIC REVISION VENTRICULAR-PERITONEAL (V-P) SHUNT;  Surgeon: Georganna Skeans, MD;  Location: [facility];  Service: General;  Laterality: Right;   LOWER EXTREMITY ANGIOGRAPHY Left 06/11/2020   Procedure: LOWER EXTREMITY ANGIOGRAPHY;  Surgeon: Algernon Huxley, MD;  Location: [facility];  Service: Cardiovascular;  Laterality: Left;   Neuropsychiatric evaluation  04/2012   poor short-term memory, poor recall, delayed reaction time; permanently disabled.      Prior to Admission medications   Medication Sig Start Date End Date Taking? Authorizing Provider  albuterol (VENTOLIN HFA) 108 (90 Base) MCG/ACT inhaler Inhale 2 puffs into the lungs every 6 (six) hours as needed for wheezing or shortness of breath. 05/21/19  Yes Loletha Grayer, MD  ARMOUR THYROID 120 MG tablet Take 120 mg by mouth every morning. 02/17/21  Yes [provider]  aspirin EC 81 MG tablet Take 1 tablet (81 mg total) by mouth daily. 06/11/20  Yes Dew, Erskine Squibb, MD  atorvastatin (LIPITOR) 20 MG tablet Take 1 tablet (20 mg total) by mouth daily at 6 PM. 10/24/17  Yes [name], MD  diltiazem (CARDIZEM CD) 120 MG 24 hr capsule Take by mouth daily. 05/06/20  Yes [provider]  ESBRIET 267 MG TABS Take 3 tablets by mouth 3 (three) times daily as needed. 01/23/20  Yes [provider]  linagliptin (TRADJENTA) 5 MG TABS tablet Take 1 tablet (5 mg total) by mouth daily. 05/21/19  Yes Wieting, Richard, MD  metFORMIN (GLUCOPHAGE) 1000 MG tablet TAKE 1 TABLET BY MOUTH 2 TIMES DAILY WITH MEAL. Patient taking differently: Take 1,000 mg by mouth 2 (two) times daily with a meal. 03/16/18  Yes [name], MD  metoprolol succinate (TOPROL-XL) 25 MG 24 hr tablet Take by mouth. 05/01/20 05/01/21 Yes [provider]  mirtazapine (REMERON) 15 MG tablet Take by mouth. 01/14/20 04/08/21 Yes [provider]  morphine (MS CONTIN) 30 MG 12 hr tablet SMARTSIG:1 Tablet(s) By Mouth Every 12 Hours 02/05/21  Yes [provider]  pantoprazole (PROTONIX) 40 MG tablet Take by mouth. 02/09/21 02/09/22 Yes [provider]  predniSONE (DELTASONE) 10 MG tablet Take 5 mg by mouth every evening.  11/13/17  Yes [provider]  venlafaxine XR (EFFEXOR-XR) 150 MG 24 hr capsule Take 1 capsule (150 mg total) by mouth daily with breakfast. 01/24/18  Yes [name], MD  Vitamin D, Ergocalciferol, (DRISDOL) 1.25 MG (50000 UNIT) CAPS capsule Take by mouth. 07/09/20  Yes [provider]  alendronate (FOSAMAX) 70 MG tablet Take by mouth. Patient not taking: No sig reported 04/02/20   [provider]  Blood Glucose Monitoring Suppl (ACCU-CHEK GUIDE) w/Device KIT See admin instructions. 05/22/19   [provider]  clobetasol ointment (TEMOVATE) 0.05 % Apply topically 2 (two) times daily. 01/12/21   [provider]  clopidogrel (PLAVIX) 75 MG tablet Take 1 tablet (75 mg total) by mouth daily. 06/11/20   Algernon Huxley, MD  diazepam (VALIUM) 5 MG tablet Take 5 mg by mouth 2 (two) times daily.  04/25/14   [provider]  Docusate Sodium 100 MG capsule Take 1 tablet by mouth in the morning, at noon, and at bedtime.    [provider]  Ensure Max Protein (ENSURE MAX PROTEIN) LIQD Take 330 mLs (11 oz total) by mouth 2 (two) times daily. Patient not taking: No sig reported 05/21/19   Loletha Grayer, MD  famotidine (PEPCID) 20 MG tablet Take by mouth. 05/29/19 06/11/20  [provider]  hydrocortisone 2.5 % ointment Apply topically 2 (two) times daily. 12/07/20   [provider]  polyethylene glycol (MIRALAX / GLYCOLAX) 17 g packet Take 17 g by mouth daily. Patient not taking: No sig reported 05/21/19   Loletha Grayer, MD  predniSONE  (DELTASONE) 10 MG tablet Take 20 mg by mouth daily with breakfast.  Patient not taking: No sig reported    [provider]  rOPINIRole (REQUIP) 0.5 MG tablet Take by mouth. 09/23/19 02/24/21  [provider]  sitaGLIPtin (JANUVIA) 100 MG tablet Take by mouth. 05/27/19 02/24/21  [provider]  tapentadol HCl (NUCYNTA) 75 MG tablet Take 75 mg by mouth every 4 (four) hours as needed for moderate pain or severe pain.  Patient not taking: No sig reported    [provider]  thiamine 100 MG tablet Take 1 tablet (100 mg total) by mouth daily. Patient not taking: No sig reported 05/21/19   Loletha Grayer, MD    Allergies as of 02/24/2021 - Review Complete 02/24/2021  Allergen Reaction Noted   Advair diskus [fluticasone-salmeterol] Shortness Of Breath 04/04/2012   Erythromycin Nausea And Vomiting 03/16/2007   Iodinated diagnostic agents Shortness Of Breath and Other (See Comments) 04/08/2014   Ativan [lorazepam] Other (See Comments) 07/12/2016   Darvocet [propoxyphene n-acetaminophen] Nausea And Vomiting  04/16/2012   Doxycycline hyclate Nausea And Vomiting, Swelling, and Other (See Comments) 04/04/2012   Propoxyphene Nausea And Vomiting 04/16/2012   Septra [sulfamethoxazole-trimethoprim] Rash 04/16/2012    Family History  Problem Relation Age of Onset   Heart disease Father        cad, chf   Cancer Father    Heart disease Brother 43       CABG   Cancer Brother 47       pancreatic cancer   Diabetes Brother    Diabetes Brother    Heart disease Brother 79       CABG   Diabetes Brother    Heart disease Brother 67       CABG   Cancer Brother 30       prostate cancer   Heart disease Mother        cad   Diabetes Mother    Stroke Mother     Social History   Socioeconomic History   Marital status: Married    Spouse name: Not on file   Number of children: 2   Years of education: Not on file   Highest education level: Not on file  Occupational History     Comment: works full time 40 hrs  Tobacco Use   Smoking status: Former    Packs/day: 1.00    Years: 30.00    Pack years: 30.00    Types: Cigarettes   Smokeless tobacco: Never  Vaping Use   Vaping Use: Never used  Substance and Sexual Activity   Alcohol use: No   Drug use: No   Sexual activity: Never  Other Topics Concern   Not on file  Social History Narrative   Marital status:  Married x 35 years.       Children:  Children ages 49, 87.   2 grandchildren (63, 3).      Lives: with wife.     Employment: disability since 2013.       Tobacco:  1/2 ppd x 40 years.      Alcohol: rarely.      Exercise: walking the dogs daily.   Caffeine use: Carbonated beverages, Pepsi: Coffee, many servings a day Pepsi 3-4  Coffee 2 cups. Smoke detectors in home. Always wears seatbelts. Exercise: Inactive. No guns in the home.   Social Determinants of Health   Financial Resource Strain: Not on file  Food Insecurity: Not on file  Transportation Needs: Not on file  Physical Activity: Not on file  Stress: Not on file  Social Connections: Not on file  Intimate Partner Violence: Not on file    Review of Systems: See HPI, otherwise negative ROS  Physical Exam: BP 123/85   Pulse 78   Temp 98.3 F (36.8 C) (Temporal)   Resp 17   Ht _0  (1.753 m)   Wt 83.9 kg   SpO2 97%   BMI 27.32 kg/m  General:   Alert,  pleasant and cooperative in NAD Head:  Normocephalic and atraumatic. Neck:  Supple; no masses or thyromegaly. Lungs:  Clear throughout to auscultation, normal respiratory effort.    Heart:  +S1, +S2, Regular rate and rhythm, No edema. Abdomen:  Soft, nontender and nondistended. Normal bowel sounds, without guarding, and without rebound.   Neurologic:  Alert and  oriented x4;  grossly normal neurologically.  Impression/Plan: Babe Bergman is here for an endoscopy and colonoscopy  to be performed for  evaluation of iron deficiency anemia    Risks, benefits,  limitations, and  alternatives regarding endoscopy have been reviewed with the patient.  Questions have been answered.  All parties agreeable.   Jonathon Bellows, MD  03/16/2021, 8:08 AM

## 2021-03-16 NOTE — Anesthesia Procedure Notes (Signed)
Procedure Name: General with mask airway Date/Time: 03/16/2021 8:25 AM Performed by: Kelton Pillar, CRNA Pre-anesthesia Checklist: Patient identified, Emergency Drugs available, Suction available and Patient being monitored Patient Re-evaluated:Patient Re-evaluated prior to induction Oxygen Delivery Method: Simple face mask Induction Type: IV induction Placement Confirmation: positive ETCO2 and CO2 detector Dental Injury: Teeth and Oropharynx as per pre-operative assessment

## 2021-03-16 NOTE — Anesthesia Postprocedure Evaluation (Signed)
Anesthesia Post Note  Patient: Kirk Robinson  Procedure(s) Performed: COLONOSCOPY WITH PROPOFOL ESOPHAGOGASTRODUODENOSCOPY (EGD) WITH PROPOFOL  Patient location during evaluation: Endoscopy Anesthesia Type: General Level of consciousness: awake and alert and oriented Pain management: pain level controlled Vital Signs Assessment: post-procedure vital signs reviewed and stable Respiratory status: spontaneous breathing, nonlabored ventilation and respiratory function stable Cardiovascular status: blood pressure returned to baseline and stable Postop Assessment: no signs of nausea or vomiting Anesthetic complications: no   No notable events documented.   Last Vitals:  Vitals:   03/16/21 0903 03/16/21 0913  BP: (!) 129/98 (!) 116/92  Pulse: 89 70  Resp: 14 16  Temp:    SpO2: 100% 100%    Last Pain:  Vitals:   03/16/21 0913  TempSrc:   PainSc: 0-No pain                 Tailer Volkert

## 2021-03-16 NOTE — Transfer of Care (Signed)
Immediate Anesthesia Transfer of Care Note  Patient: Kirk Robinson  Procedure(s) Performed: COLONOSCOPY WITH PROPOFOL ESOPHAGOGASTRODUODENOSCOPY (EGD) WITH PROPOFOL  Patient Location: PACU and Endoscopy Unit  Anesthesia Type:General  Level of Consciousness: drowsy and patient cooperative  Airway & Oxygen Therapy: Patient Spontanous Breathing and Patient connected to face mask oxygen  Post-op Assessment: Report given to RN and Post -op Vital signs reviewed and stable  Post vital signs: Reviewed and stable  Last Vitals:  Vitals Value Taken Time  BP 112/76 03/16/21 0853  Temp 36.7 C 03/16/21 0853  Pulse 79 03/16/21 0854  Resp 11 03/16/21 0854  SpO2 100 % 03/16/21 0854  Vitals shown include unvalidated device data.  Last Pain:  Vitals:   03/16/21 0853  TempSrc: Temporal  PainSc: Asleep         Complications: No notable events documented.

## 2021-03-17 ENCOUNTER — Encounter: Payer: Self-pay | Admitting: Gastroenterology

## 2021-03-17 LAB — SURGICAL PATHOLOGY

## 2021-03-24 ENCOUNTER — Telehealth: Payer: Self-pay

## 2021-03-24 NOTE — Telephone Encounter (Signed)
Tried to call patient and a women answered the phone and when I asked for the patient the women did not say anything and disconnected the phone. Will call back at a later time

## 2021-03-24 NOTE — Telephone Encounter (Signed)
-----   Message from Jonathon Bellows, MD sent at 03/18/2021 11:33 AM EDT ----- Inform patient that the biopsies of the stomach showed gastritis and all the polyps taken out from the colon were precancerous.  Because the bowel prep was poor recommend 2-day prep with repeat colonoscopy in 4 weeks

## 2021-03-25 ENCOUNTER — Telehealth: Payer: Self-pay | Admitting: Gastroenterology

## 2021-03-25 NOTE — Telephone Encounter (Signed)
Patients wife calling about capsule study.

## 2021-03-29 NOTE — Telephone Encounter (Signed)
Called patient back and patient's wife and they both did not answer their phones. I will send him a letter letting him know that Dr. Vicente Males is recommending a repeat colonoscopy with a two day prep and that he will need to call us so we could schedule it for him.

## 2021-04-01 ENCOUNTER — Other Ambulatory Visit: Payer: Self-pay

## 2021-04-01 DIAGNOSIS — D509 Iron deficiency anemia, unspecified: Secondary | ICD-10-CM

## 2021-04-01 MED ORDER — PEG 3350-KCL-NA BICARB-NACL 420 G PO SOLR: ORAL | 0 refills | Status: DC

## 2021-04-01 NOTE — Telephone Encounter (Signed)
Patient's wife called again and I was able to speak to wife-Linda. I was able to let her know that the patient had a poor prep, therefore, Dr. Vicente Males would like to do his colonoscopy again. We also spoke about scheduling her husband a capsule studay that Dr. Vicente Males wanted him to do. [name] wanted Dr. Vicente Males to know that her husband did a 2 day prep and an enema before he had his colonoscopy done and he was still not fully cleaned out. She wanted to know if he could take additional prep and see if this would help him clean out. I told her that I would ask Dr. Vicente Males and that way he could try that. Capsule study is scheduled for 04/13/2021. Dr. Vicente Males, please advise about patient having two days of prep. Thank you.

## 2021-04-01 NOTE — Telephone Encounter (Signed)
Called patient's wife-Linda to let her know what Dr. Georgeann Oppenheim recommendations were and she agreed. She stated that she would have to convince her husband to do it that way but that she would try. At the end she agreed on him doing his repeat colonoscopy on 05/04/2021. I told her that I would be sending him his prescription to his pharmacy and his instructions by mail. [name] understood and had no further questions.

## 2021-04-13 ENCOUNTER — Ambulatory Visit: Payer: Medicare HMO | Admitting: Gastroenterology

## 2021-04-16 ENCOUNTER — Encounter
Admission: RE | Disposition: A | Discharge status: Home / Self Care | Payer: Self-pay | Source: Home / Self Care | Attending: Gastroenterology

## 2021-04-16 ENCOUNTER — Ambulatory Visit
Admission: RE | Admit: 2021-04-16 | Discharge: 2021-04-16 | Disposition: A | Discharge status: Home / Self Care | Payer: Medicare HMO | Attending: Gastroenterology | Admitting: Gastroenterology

## 2021-04-16 DIAGNOSIS — D509 Iron deficiency anemia, unspecified: Secondary | ICD-10-CM | POA: Insufficient documentation

## 2021-04-16 DIAGNOSIS — D500 Iron deficiency anemia secondary to blood loss (chronic): Secondary | ICD-10-CM | POA: Diagnosis not present

## 2021-04-16 HISTORY — PX: GIVENS CAPSULE STUDY: SHX5432

## 2021-04-16 SURGERY — IMAGING PROCEDURE, GI TRACT, INTRALUMINAL, VIA CAPSULE

## 2021-04-19 ENCOUNTER — Encounter: Payer: Self-pay | Admitting: Gastroenterology

## 2021-04-28 ENCOUNTER — Telehealth: Payer: Self-pay | Admitting: Gastroenterology

## 2021-04-28 NOTE — Telephone Encounter (Signed)
Patient calling for capsule study results. Also, never saw the capsule come out.

## 2021-04-30 ENCOUNTER — Telehealth: Payer: Self-pay | Admitting: Gastroenterology

## 2021-04-30 DIAGNOSIS — R1084 Generalized abdominal pain: Secondary | ICD-10-CM

## 2021-04-30 NOTE — Telephone Encounter (Signed)
Pt. Wife calling about results of her husbands test. Requesting a call back

## 2021-04-30 NOTE — Telephone Encounter (Signed)
Dr. Vicente Males, this is the capsule study you gave me yesterday, which is normal. However, for the capsule still in his system, what can I order for the patient. Please advise.

## 2021-05-05 NOTE — Telephone Encounter (Signed)
Called patient but had to leave him a

## 2021-05-05 NOTE — Telephone Encounter (Signed)
Called patient but had to leave a voicemail to return our call and to go to the [facility] and have an abdominal X-Ray done to see if the capsule is no longer in his abdomen.

## 2021-05-06 ENCOUNTER — Telehealth: Payer: Self-pay | Admitting: Gastroenterology

## 2021-05-06 NOTE — Telephone Encounter (Signed)
Pt. Wife calling for her husbands results

## 2021-05-06 NOTE — Telephone Encounter (Signed)
Called patient's wife-Linda and informed her that the capsule study was normal and if they have not seen the capsule pass through, then to [facility] and [facility] x-ray. I told her that she did not need an appointment. She agreed on taking her husband.

## 2021-05-06 NOTE — Telephone Encounter (Signed)
Called patient and his phone was busy.

## 2021-05-10 ENCOUNTER — Ambulatory Visit
Admission: RE | Admit: 2021-05-10 | Discharge: 2021-05-10 | Disposition: A | Discharge status: Home / Self Care | Payer: Medicare HMO | Source: Ambulatory Visit | Attending: Gastroenterology | Admitting: Gastroenterology

## 2021-05-10 ENCOUNTER — Ambulatory Visit
Admission: RE | Admit: 2021-05-10 | Discharge: 2021-05-10 | Disposition: A | Discharge status: Home / Self Care | Payer: Medicare HMO | Attending: Gastroenterology | Admitting: Gastroenterology

## 2021-05-10 DIAGNOSIS — R1084 Generalized abdominal pain: Secondary | ICD-10-CM | POA: Insufficient documentation

## 2021-05-11 ENCOUNTER — Other Ambulatory Visit: Payer: Self-pay | Admitting: Specialist

## 2021-05-11 DIAGNOSIS — R0609 Other forms of dyspnea: Secondary | ICD-10-CM

## 2021-05-11 DIAGNOSIS — R0600 Dyspnea, unspecified: Secondary | ICD-10-CM

## 2021-05-11 DIAGNOSIS — J849 Interstitial pulmonary disease, unspecified: Secondary | ICD-10-CM

## 2021-05-13 ENCOUNTER — Telehealth: Payer: Self-pay | Admitting: Gastroenterology

## 2021-05-13 NOTE — Telephone Encounter (Signed)
Pt. Wife calling for results from procedure

## 2021-05-14 NOTE — Telephone Encounter (Signed)
Called patient and spoke to Mrs. Resh. I notified the wife about his X-Ray result and what Dr. Vicente Males stated about the capsule not showing in his colon. She did stated that her husband had been constipated and that he is currently taking Miralax once a day instead of every other day but still constipated. Therefore, I told her to increase the Miralax intake to 17 Grams twice a day. Mrs. Franek agreed. I told her to call us if this will not work for him. She understood and had no further questions.

## 2021-05-31 ENCOUNTER — Other Ambulatory Visit (INDEPENDENT_AMBULATORY_CARE_PROVIDER_SITE_OTHER): Payer: Self-pay | Admitting: Vascular Surgery

## 2021-05-31 ENCOUNTER — Ambulatory Visit
Admission: RE | Admit: 2021-05-31 | Discharge: 2021-05-31 | Disposition: A | Discharge status: Home / Self Care | Payer: Medicare HMO | Source: Ambulatory Visit | Attending: Specialist | Admitting: Specialist

## 2021-05-31 ENCOUNTER — Other Ambulatory Visit: Payer: Self-pay

## 2021-05-31 DIAGNOSIS — J849 Interstitial pulmonary disease, unspecified: Secondary | ICD-10-CM

## 2021-05-31 DIAGNOSIS — R0609 Other forms of dyspnea: Secondary | ICD-10-CM

## 2021-07-08 ENCOUNTER — Other Ambulatory Visit: Payer: Self-pay

## 2021-07-08 ENCOUNTER — Encounter: Payer: Medicare HMO | Attending: Specialist

## 2021-07-08 DIAGNOSIS — J8410 Pulmonary fibrosis, unspecified: Secondary | ICD-10-CM

## 2021-07-08 NOTE — Progress Notes (Signed)
Virtual Visit completed. Patient informed on EP and RD appointment and 6 Minute walk test. Patient also informed of patient health questionnaires on My Chart. Patient Verbalizes understanding. Visit diagnosis can be found in [facility] 05/10/2021.

## 2021-08-12 ENCOUNTER — Encounter: Payer: Medicare HMO | Attending: Specialist

## 2021-08-12 ENCOUNTER — Other Ambulatory Visit: Payer: Self-pay

## 2021-08-12 VITALS — Ht 69.0 in | Wt 172.7 lb

## 2021-08-12 DIAGNOSIS — J8410 Pulmonary fibrosis, unspecified: Secondary | ICD-10-CM | POA: Diagnosis present

## 2021-08-12 NOTE — Progress Notes (Signed)
Pulmonary Individual Treatment Plan  Patient Details  Name: Kirk Robinson MRN: [id number] Date of Birth: 1956-03-24 Referring Provider:   Flowsheet Row Pulmonary Rehab from 08/12/2021 in [facility] Cardiac and Pulmonary Rehab  Referring Provider Raul Del       Initial Encounter Date:  Flowsheet Row Pulmonary Rehab from 08/12/2021 in [facility] Cardiac and Pulmonary Rehab  Date 08/12/21       Visit Diagnosis: Pulmonary fibrosis ([place])  Patient's Home Medications on Admission:  Current Outpatient Medications:    Accu-Chek FastClix Lancets MISC, Apply topically daily., Disp: , Rfl:    albuterol (VENTOLIN HFA) 108 (90 Base) MCG/ACT inhaler, Inhale 2 puffs into the lungs every 6 (six) hours as needed for wheezing or shortness of breath., Disp: 18 g, Rfl: 0   alendronate (FOSAMAX) 70 MG tablet, Take by mouth. (Patient not taking: Reported on 06/11/2020), Disp: , Rfl:    ARIPiprazole (ABILIFY) 2 MG tablet, Take 2 mg by mouth daily., Disp: , Rfl:    ARMOUR THYROID 120 MG tablet, Take 120 mg by mouth every morning., Disp: , Rfl:    aspirin EC 81 MG tablet, Take 1 tablet (81 mg total) by mouth daily., Disp: 150 tablet, Rfl: 2   atorvastatin (LIPITOR) 20 MG tablet, Take 1 tablet (20 mg total) by mouth daily at 6 PM., Disp: 90 tablet, Rfl: 3   Blood Glucose Monitoring Suppl (ACCU-CHEK GUIDE) w/Device KIT, See admin instructions., Disp: , Rfl:    BREZTRI AEROSPHERE 160-9-4.8 MCG/ACT AERO, SMARTSIG:2 Inhalation Via Inhaler Twice Daily, Disp: , Rfl:    clobetasol ointment (TEMOVATE) 0.05 %, Apply topically 2 (two) times daily. (Patient not taking: Reported on 07/08/2021), Disp: , Rfl:    clopidogrel (PLAVIX) 75 MG tablet, TAKE 1 TABLET BY MOUTH EVERY DAY, Disp: 90 tablet, Rfl: 3   diazepam (VALIUM) 5 MG tablet, Take 5 mg by mouth 2 (two) times daily. , Disp: , Rfl:    diazepam (VALIUM) 5 MG tablet, Take by mouth. (Patient not taking: Reported on 07/08/2021), Disp: , Rfl:    diltiazem (CARDIZEM CD) 120 MG 24  hr capsule, Take by mouth daily., Disp: , Rfl:    Docusate Sodium 100 MG capsule, Take 1 tablet by mouth in the morning, at noon, and at bedtime. (Patient not taking: Reported on 07/08/2021), Disp: , Rfl:    Ensure Max Protein (ENSURE MAX PROTEIN) LIQD, Take 330 mLs (11 oz total) by mouth 2 (two) times daily. (Patient not taking: Reported on 06/11/2020), Disp: 330 mL, Rfl: 0   ESBRIET 267 MG TABS, Take 3 tablets by mouth 3 (three) times daily as needed., Disp: , Rfl:    famotidine (PEPCID) 20 MG tablet, Take by mouth., Disp: , Rfl:    glucose blood test strip, See admin instructions., Disp: , Rfl:    hydrocortisone 2.5 % ointment, Apply topically 2 (two) times daily., Disp: , Rfl:    linagliptin (TRADJENTA) 5 MG TABS tablet, Take 1 tablet (5 mg total) by mouth daily. (Patient not taking: Reported on 07/08/2021), Disp: 30 tablet, Rfl: 0   metFORMIN (GLUCOPHAGE) 1000 MG tablet, TAKE 1 TABLET BY MOUTH 2 TIMES DAILY WITH MEAL. (Patient taking differently: Take 1,000 mg by mouth 2 (two) times daily with a meal.), Disp: 180 tablet, Rfl: 0   metoprolol succinate (TOPROL-XL) 25 MG 24 hr tablet, Take by mouth., Disp: , Rfl:    metoprolol succinate (TOPROL-XL) 25 MG 24 hr tablet, Take 1 tablet by mouth daily., Disp: , Rfl:    mirtazapine (REMERON) 15 MG  tablet, Take by mouth., Disp: , Rfl:    mirtazapine (REMERON) 30 MG tablet, Take 30 mg by mouth at bedtime., Disp: , Rfl:    morphine (MS CONTIN) 30 MG 12 hr tablet, SMARTSIG:1 Tablet(s) By Mouth Every 12 Hours, Disp: , Rfl:    naloxone (NARCAN) nasal spray 4 mg/0.1 mL, Place into the nose., Disp: , Rfl:    pantoprazole (PROTONIX) 40 MG tablet, Take by mouth., Disp: , Rfl:    Pirfenidone 267 MG TABS, Take by mouth., Disp: , Rfl:    polyethylene glycol (MIRALAX / GLYCOLAX) 17 g packet, Take 17 g by mouth daily., Disp: 30 each, Rfl: 0   polyethylene glycol-electrolytes (NULYTELY) 420 g solution, Prepare according to package instructions. Starting at 5:00 PM:  Drink one 8 oz glass of mixture every 15 minutes until you finish half of the jug. Five hours prior to procedure, drink 8 oz glass of mixture every 15 minutes until it is all gone. Make sure you do not drink anything 4 hours prior to your procedure. (Patient not taking: Reported on 07/08/2021), Disp: 8000 mL, Rfl: 0   predniSONE (DELTASONE) 10 MG tablet, Take 20 mg by mouth daily with breakfast., Disp: , Rfl:    predniSONE (DELTASONE) 10 MG tablet, Take 5 mg by mouth every evening.  (Patient not taking: Reported on 07/08/2021), Disp: , Rfl:    predniSONE (DELTASONE) 10 MG tablet, Take 1 tablet by mouth daily. (Patient not taking: Reported on 07/08/2021), Disp: , Rfl:    rOPINIRole (REQUIP) 0.5 MG tablet, Take by mouth., Disp: , Rfl:    sitaGLIPtin (JANUVIA) 100 MG tablet, Take by mouth., Disp: , Rfl:    tapentadol HCl (NUCYNTA) 75 MG tablet, Take 75 mg by mouth every 4 (four) hours as needed for moderate pain or severe pain.  (Patient not taking: Reported on 06/11/2020), Disp: , Rfl:    thiamine 100 MG tablet, Take 1 tablet (100 mg total) by mouth daily. (Patient not taking: Reported on 06/11/2020), Disp: 30 tablet, Rfl: 0   venlafaxine XR (EFFEXOR-XR) 150 MG 24 hr capsule, Take 1 capsule (150 mg total) by mouth daily with breakfast., Disp: 90 capsule, Rfl: 1   Vitamin D, Ergocalciferol, (DRISDOL) 1.25 MG (50000 UNIT) CAPS capsule, Take by mouth., Disp: , Rfl:   Past Medical History: Past Medical History:  Diagnosis Date   Alcohol use 03/16/2007   patient risk factors   Allergic rhinitis, cause unspecified    Altered mental status    Anxiety state, unspecified    Cervicalgia 09/17/2007   COPD (chronic obstructive pulmonary disease) ([place])    Diabetes mellitus without complication ([place])    Diplopia 12/21/2007   Esophageal reflux    Gastritis 05/13/2009   HCAP (healthcare-associated pneumonia) 08/12/2014   Hematuria 03/16/2007   Hyperthyroidism    graves disease   Iodine hypothyroidism     Memory loss    NSIP (nonspecific interstitial pneumonia) ([place])    on azathioprine   Osteoporosis, unspecified    bone density per Mor[place] in 2012   Other abnormal glucose 04/08/2008   Other diseases of lung, not elsewhere classified 01/23/2009   s/p [place]/pulmonology consult; intolerant  to Advair   Peripheral vascular disease, unspecified ([place]) 12/24/2009   Personal history of colonic polyps    Pneumonia, organism unspecified([place])    Pure hypercholesterolemia    Shortness of breath    Stroke ([place]) 2005   Tobacco use disorder 03/16/2007   patient risk factors   Unspecified hypothyroidism 01/07/2010  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    Tobacco Use: Social History   Tobacco Use  Smoking Status Former   Packs/day: 1.00   Years: 30.00   Pack years: 30.00   Types: Cigarettes   Quit date: 08/22/2020   Years since quitting: 0.9  Smokeless Tobacco Never    Labs: Recent Review Flowsheet Data     Labs for ITP Cardiac and Pulmonary Rehab Latest Ref Rng & Units 07/12/2016 03/01/2017 10/18/2017 01/24/2018 05/11/2019   Cholestrol 100 - 199 mg/dL 167 159 170 191 -   LDLCALC 0 - 99 mg/dL 91 85 72 87 -   HDL >39 mg/dL 53 49 56 47 -   Trlycerides 0 - 149 mg/dL 117 123 211(H) 283(H) -   Hemoglobin A1c 4.8 - 5.6 % 6.2(H) 6.0 6.6(H) 6.5(H) 13.2(H)   PHART 7.350 - 7.450 - - - - -   PCO2ART 35.0 - 45.0 mmHg - - - - -   HCO3 20.0 - 28.0 mmol/L - - - - 38.6(H)   TCO2 0 - 100 mmol/L - - - - -   O2SAT % - - - - 96.8        Pulmonary Assessment Scores:  Pulmonary Assessment Scores     Row Name 08/12/21 1536         ADL UCSD   SOB Score total 91     Rest 1     Walk 3     Stairs 4     Bath 4     Dress 4     Shop 5       CAT Score   CAT Score 20       mMRC Score   mMRC Score 2              UCSD: Self-administered rating of dyspnea associated with activities of daily living (ADLs) 6-point scale (0 = "not at all" to 5 = "maximal or unable to  do because of breathlessness")  Scoring Scores range from 0 to 120.  Minimally important difference is 5 units  CAT: CAT can identify the health impairment of COPD patients and is better correlated with disease progression.  CAT has a scoring range of zero to 40. The CAT score is classified into four groups of low (less than 10), medium (10 - 20), high (21-30) and very high (31-40) based on the impact level of disease on health status. A CAT score over 10 suggests significant symptoms.  A worsening CAT score could be explained by an exacerbation, poor medication adherence, poor inhaler technique, or progression of COPD or comorbid conditions.  CAT MCID is 2 points  mMRC: mMRC (Modified Medical Research Council) Dyspnea Scale is used to assess the degree of baseline functional disability in patients of respiratory disease due to dyspnea. No minimal important difference is established. A decrease in score of 1 point or greater is considered a positive change.   Pulmonary Function Assessment:  Pulmonary Function Assessment - 07/08/21 1048       Breath   Shortness of Breath Yes;Limiting activity             Exercise Target Goals: Exercise Program Goal: Individual exercise prescription set using results from initial 6 min walk test and THRR while considering  patients activity barriers and safety.   Exercise Prescription Goal: Initial exercise prescription builds to 30-45 minutes a day of aerobic activity, 2-3 days per week.  Home exercise guidelines will be given to patient during  program as part of exercise prescription that the participant will acknowledge.  Education: Aerobic Exercise: - Group verbal and visual presentation on the components of exercise prescription. Introduces F.I.T.T principle from ACSM for exercise prescriptions.  Reviews F.I.T.T. principles of aerobic exercise including progression. Written material given at graduation.   Education: Resistance Exercise: -  Group verbal and visual presentation on the components of exercise prescription. Introduces F.I.T.T principle from ACSM for exercise prescriptions  Reviews F.I.T.T. principles of resistance exercise including progression. Written material given at graduation.    Education: Exercise & Equipment Safety: - Individual verbal instruction and demonstration of equipment use and safety with use of the equipment. Flowsheet Row Pulmonary Rehab from 08/12/2021 in [facility] Cardiac and Pulmonary Rehab  Date 08/12/21  Educator AS  Instruction Review Code 1- Verbalizes Understanding       Education: Exercise Physiology & General Exercise Guidelines: - Group verbal and written instruction with models to review the exercise physiology of the cardiovascular system and associated critical values. Provides general exercise guidelines with specific guidelines to those with heart or lung disease.  Flowsheet Row Pulmonary Rehab from 08/12/2021 in [facility] Cardiac and Pulmonary Rehab  Education need identified 08/12/21       Education: Flexibility, Balance, Mind/Body Relaxation: - Group verbal and visual presentation with interactive activity on the components of exercise prescription. Introduces F.I.T.T principle from ACSM for exercise prescriptions. Reviews F.I.T.T. principles of flexibility and balance exercise training including progression. Also discusses the mind body connection.  Reviews various relaxation techniques to help reduce and manage stress (i.e. Deep breathing, progressive muscle relaxation, and visualization). Balance handout provided to take home. Written material given at graduation.   Activity Barriers & Risk Stratification:   6 Minute Walk:  6 Minute Walk     Row Name 08/12/21 1521         6 Minute Walk   Phase Initial     Distance 500 feet     Walk Time 4.5 minutes     # of Rest Breaks 1     MPH 1.3     METS 2.2     RPE 15     Perceived Dyspnea  2     VO2 Peak 7.7     Symptoms No      Resting HR 86 bpm     Resting BP 118/62     Resting Oxygen Saturation  96 %     Exercise Oxygen Saturation  during 6 min walk 84 %     Max Ex. HR 118 bpm     Max Ex. BP 130/64     2 Minute Post BP 110/62       Interval HR   1 Minute HR 106     2 Minute HR 114     3 Minute HR 115     4 Minute HR 118     5 Minute HR 101     6 Minute HR 103     Interval Heart Rate? Yes       Interval Oxygen   Interval Oxygen? Yes     Baseline Oxygen Saturation % 96 %     1 Minute Oxygen Saturation % 89 %     1 Minute Liters of Oxygen 3 L  pulsed     2 Minute Oxygen Saturation % 88 %     2 Minute Liters of Oxygen 3 L  pulsed     3 Minute Oxygen Saturation % 86 %  3 Minute Liters of Oxygen 3 L  pulsed     4 Minute Oxygen Saturation % 84 %  stopped to rest - fatigue     4 Minute Liters of Oxygen 3 L  pulsed     5 Minute Oxygen Saturation % 85 %     5 Minute Liters of Oxygen 3 L  pulsed     6 Minute Oxygen Saturation % 90 %     6 Minute Liters of Oxygen 3 L  pulsed     2 Minute Post Oxygen Saturation % 91 %     2 Minute Post Liters of Oxygen 3 L  pulsed             Oxygen Initial Assessment:  Oxygen Initial Assessment - 07/08/21 1046       Home Oxygen   Home Oxygen Device Portable Concentrator;Home Concentrator;E-Tanks    Sleep Oxygen Prescription Continuous    Liters per minute 3.5    Home Exercise Oxygen Prescription Continuous    Liters per minute 3.5    Home Resting Oxygen Prescription Continuous    Liters per minute 3.5    Compliance with Home Oxygen Use Yes      Initial 6 min Walk   Oxygen Used Pulsed;Portable Concentrator    Liters per minute 3      Program Oxygen Prescription   Program Oxygen Prescription Continuous    Liters per minute 3      Intervention   Short Term Goals To learn and exhibit compliance with exercise, home and travel O2 prescription;To learn and understand importance of monitoring SPO2 with pulse oximeter and demonstrate accurate use of the  pulse oximeter.;To learn and understand importance of maintaining oxygen saturations>88%;To learn and demonstrate proper use of respiratory medications;To learn and demonstrate proper pursed lip breathing techniques or other breathing techniques.     Long  Term Goals Exhibits compliance with exercise, home  and travel O2 prescription;Verbalizes importance of monitoring SPO2 with pulse oximeter and return demonstration;Maintenance of O2 saturations>88%;Exhibits proper breathing techniques, such as pursed lip breathing or other method taught during program session;Compliance with respiratory medication;Demonstrates proper use of MDIs             Oxygen Re-Evaluation:   Oxygen Discharge (Final Oxygen Re-Evaluation):   Initial Exercise Prescription:  Initial Exercise Prescription - 08/12/21 1500       Date of Initial Exercise RX and Referring Provider   Date 08/12/21    Referring Provider Raul Del      Oxygen   Oxygen Continuous    Liters 3    Maintain Oxygen Saturation 88% or higher      Treadmill   MPH 1    Grade 0.5    Minutes 15    METs 1.8      Recumbant Bike   Level 1    RPM 60    Minutes 15    METs 2      NuStep   Level 1    SPM 80    Minutes 15    METs 2      Arm Ergometer   Level 1    RPM 25    Minutes 15    METs 2      Prescription Details   Frequency (times per week) 2    Duration Progress to 30 minutes of continuous aerobic without signs/symptoms of physical distress      Intensity   THRR 40-80% of Max Heartrate 114-143  Ratings of Perceived Exertion 11-15    Perceived Dyspnea 0-4      Resistance Training   Training Prescription Yes    Weight 3 lb    Reps 10-15             Perform Capillary Blood Glucose checks as needed.  Exercise Prescription Changes:   Exercise Prescription Changes     Row Name 08/12/21 1500             Response to Exercise   Blood Pressure (Admit) 118/62       Blood Pressure (Exercise) 130/64        Blood Pressure (Exit) 110/62       Heart Rate (Admit) 86 bpm       Heart Rate (Exercise) 118 bpm       Heart Rate (Exit) 106 bpm       Oxygen Saturation (Admit) 96 %       Oxygen Saturation (Exercise) 84 %       Oxygen Saturation (Exit) 91 %       Rating of Perceived Exertion (Exercise) 15       Perceived Dyspnea (Exercise) 2       Symptoms none                Exercise Comments:   Exercise Goals and Review:   Exercise Goals     Row Name 08/12/21 1530             Exercise Goals   Increase Physical Activity Yes       Intervention Provide advice, education, support and counseling about physical activity/exercise needs.;Develop an individualized exercise prescription for aerobic and resistive training based on initial evaluation findings, risk stratification, comorbidities and participant's personal goals.       Expected Outcomes Short Term: Attend rehab on a regular basis to increase amount of physical activity.;Long Term: Add in home exercise to make exercise part of routine and to increase amount of physical activity.;Long Term: Exercising regularly at least 3-5 days a week.       Increase Strength and Stamina Yes       Intervention Provide advice, education, support and counseling about physical activity/exercise needs.       Expected Outcomes Short Term: Increase workloads from initial exercise prescription for resistance, speed, and METs.;Short Term: Perform resistance training exercises routinely during rehab and add in resistance training at home;Long Term: Improve cardiorespiratory fitness, muscular endurance and strength as measured by increased METs and functional capacity (6MWT)       Able to understand and use rate of perceived exertion (RPE) scale Yes       Intervention Provide education and explanation on how to use RPE scale       Expected Outcomes Short Term: Able to use RPE daily in rehab to express subjective intensity level;Long Term:  Able to use RPE to guide  intensity level when exercising independently       Able to understand and use Dyspnea scale Yes       Intervention Provide education and explanation on how to use Dyspnea scale       Expected Outcomes Short Term: Able to use Dyspnea scale daily in rehab to express subjective sense of shortness of breath during exertion;Long Term: Able to use Dyspnea scale to guide intensity level when exercising independently       Knowledge and understanding of Target Heart Rate Range (THRR) Yes       Intervention Provide education and  explanation of THRR including how the numbers were predicted and where they are located for reference       Expected Outcomes Short Term: Able to state/look up THRR;Short Term: Able to use daily as guideline for intensity in rehab;Long Term: Able to use THRR to govern intensity when exercising independently       Able to check pulse independently Yes       Intervention Provide education and demonstration on how to check pulse in carotid and radial arteries.;Review the importance of being able to check your own pulse for safety during independent exercise       Expected Outcomes Short Term: Able to explain why pulse checking is important during independent exercise;Long Term: Able to check pulse independently and accurately       Understanding of Exercise Prescription Yes       Intervention Provide education, explanation, and written materials on patient's individual exercise prescription       Expected Outcomes Short Term: Able to explain program exercise prescription;Long Term: Able to explain home exercise prescription to exercise independently                Exercise Goals Re-Evaluation :   Discharge Exercise Prescription (Final Exercise Prescription Changes):  Exercise Prescription Changes - 08/12/21 1500       Response to Exercise   Blood Pressure (Admit) 118/62    Blood Pressure (Exercise) 130/64    Blood Pressure (Exit) 110/62    Heart Rate (Admit) 86 bpm     Heart Rate (Exercise) 118 bpm    Heart Rate (Exit) 106 bpm    Oxygen Saturation (Admit) 96 %    Oxygen Saturation (Exercise) 84 %    Oxygen Saturation (Exit) 91 %    Rating of Perceived Exertion (Exercise) 15    Perceived Dyspnea (Exercise) 2    Symptoms none             Nutrition:  Target Goals: Understanding of nutrition guidelines, daily intake of sodium <159m, cholesterol <2014m calories 30% from fat and 7% or less from saturated fats, daily to have 5 or more servings of fruits and vegetables.  Education: All About Nutrition: -Group instruction provided by verbal, written material, interactive activities, discussions, models, and posters to present general guidelines for heart healthy nutrition including fat, fiber, MyPlate, the role of sodium in heart healthy nutrition, utilization of the nutrition label, and utilization of this knowledge for meal planning. Follow up email sent as well. Written material given at graduation.   Biometrics:  Pre Biometrics - 08/12/21 1531       Pre Biometrics   Height '5\' 9"'  (1.753 m)    Weight 172 lb 11.2 oz (78.3 kg)    BMI (Calculated) 25.49    Single Leg Stand 1.78 seconds              Nutrition Therapy Plan and Nutrition Goals:   Nutrition Assessments:  MEDIFICTS Score Key: ?70 Need to make dietary changes  40-70 Heart Healthy Diet ? 40 Therapeutic Level Cholesterol Diet  [facility]  Picture Your Plate Total Score on Admission 71      Picture Your Plate Scores: <4<20nhealthy dietary pattern with much room for improvement. 41-50 Dietary pattern unlikely to meet recommendations for good health and room for improvement. 51-60 More healthful dietary pattern, with some room for improvement.  >60 Healthy dietary pattern, although there may be some specific behaviors that could  be improved.   Nutrition Goals Re-Evaluation:   Nutrition Goals  Discharge (Final Nutrition Goals Re-Evaluation):   Psychosocial: Target Goals: Acknowledge presence or absence of significant depression and/or stress, maximize coping skills, provide positive support system. Participant is able to verbalize types and ability to use techniques and skills needed for reducing stress and depression.   Education: Stress, Anxiety, and Depression - Group verbal and visual presentation to define topics covered.  Reviews how body is impacted by stress, anxiety, and depression.  Also discusses healthy ways to reduce stress and to treat/manage anxiety and depression.  Written material given at graduation.   Education: Sleep Hygiene -Provides group verbal and written instruction about how sleep can affect your health.  Define sleep hygiene, discuss sleep cycles and impact of sleep habits. Review good sleep hygiene tips.    Initial Review & Psychosocial Screening:  Initial Psych Review & Screening - 07/08/21 1049       Initial Review   Current issues with Current Psychotropic Meds;History of Depression;Current Depression;Current Stress Concerns    Source of Stress Concerns Chronic Illness    Comments He has anxiety and nothing in particular sets him off. His depression gets to him sometimes and will come and go.      Family Dynamics   Good Support System? Yes    Comments He can look to his wife for support. He can call his children if he needed help.      Barriers   Psychosocial barriers to participate in program There are no identifiable barriers or psychosocial needs.;The patient should benefit from training in stress management and relaxation.      Screening Interventions   Interventions Encouraged to exercise;To provide support and resources with identified psychosocial needs;Provide feedback about the scores to participant    Expected Outcomes Long Term Goal: Stressors or current issues are controlled or eliminated.;Short Term goal: Utilizing psychosocial  counselor, staff and physician to assist with identification of specific Stressors or current issues interfering with healing process. Setting desired goal for each stressor or current issue identified.;Short Term goal: Identification and review with participant of any Quality of Life or Depression concerns found by scoring the questionnaire.;Long Term goal: The participant improves quality of Life and PHQ9 Scores as seen by post scores and/or verbalization of changes             Quality of Life Scores:  Scores of 19 and below usually indicate a poorer quality of life in these areas.  A difference of  2-3 points is a clinically meaningful difference.  A difference of 2-3 points in the total score of the Quality of Life Index has been associated with significant improvement in overall quality of life, self-image, physical symptoms, and general health in studies assessing change in quality of life.  PHQ-9: Recent Review Flowsheet Data     Depression screen [facility] 2/9 08/12/2021 01/24/2018 10/18/2017 03/01/2017 07/12/2016   Decreased Interest 3 0 1 1 0   Down, Depressed, Hopeless 2 0 1 1 0   PHQ - 2 Score 5 0 2 2 0   Altered sleeping 3  - 1 1 -   Tired, decreased energy 3 - 1 2 -   Change in appetite 2  - 1 0 -   Feeling bad or failure about yourself  2 - 1 1 -   Trouble concentrating 2 - 0 0 -   Moving slowly or fidgety/restless 1 - 0 - -   Suicidal thoughts 0 - 0  0 -   PHQ-9 Score 18 - 6 6 -   Difficult doing work/chores Very difficult - - - -      Interpretation of Total Score  Total Score Depression Severity:  1-4 = Minimal depression, 5-9 = Mild depression, 10-14 = Moderate depression, 15-19 = Moderately severe depression, 20-27 = Severe depression   Psychosocial Evaluation and Intervention:  Psychosocial Evaluation - 07/08/21 1051       Psychosocial Evaluation & Interventions   Interventions Encouraged to exercise with the program and follow exercise prescription;Stress management  education;Relaxation education    Comments He has anxiety and nothing in particular sets him off. His depression gets to him sometimes and will come and go.He can look to his wife for support. He can call his children if he needed help.    Expected Outcomes Short: Start [facility] to help with mood. Long: Maintain a healthy mental state.    Continue Psychosocial Services  Follow up required by staff             Psychosocial Re-Evaluation:   Psychosocial Discharge (Final Psychosocial Re-Evaluation):   Education: Education Goals: Education classes will be provided on a weekly basis, covering required topics. Participant will state understanding/return demonstration of topics presented.  Learning Barriers/Preferences:  Learning Barriers/Preferences - 07/08/21 1048       Learning Barriers/Preferences   Learning Barriers None    Learning Preferences None             General Pulmonary Education Topics:  Infection Prevention: - Provides verbal and written material to individual with discussion of infection control including proper hand washing and proper equipment cleaning during exercise session. Flowsheet Row Pulmonary Rehab from 08/12/2021 in [facility] Cardiac and Pulmonary Rehab  Date 08/12/21  Educator AS  Instruction Review Code 1- Verbalizes Understanding       Falls Prevention: - Provides verbal and written material to individual with discussion of falls prevention and safety. Flowsheet Row Pulmonary Rehab from 08/12/2021 in [facility] Cardiac and Pulmonary Rehab  Date 08/12/21  Educator AS  Instruction Review Code 1- Verbalizes Understanding       Chronic Lung Disease Review: - Group verbal instruction with posters, models, PowerPoint presentations and videos,  to review new updates, new respiratory medications, new advancements in procedures and treatments. Providing information on websites and "800" numbers for continued self-education. Includes information about  supplement oxygen, available portable oxygen systems, continuous and intermittent flow rates, oxygen safety, concentrators, and Medicare reimbursement for oxygen. Explanation of Pulmonary Drugs, including class, frequency, complications, importance of spacers, rinsing mouth after steroid MDI's, and proper cleaning methods for nebulizers. Review of basic lung anatomy and physiology related to function, structure, and complications of lung disease. Review of risk factors. Discussion about methods for diagnosing sleep apnea and types of masks and machines for OSA. Includes a review of the use of types of environmental controls: home humidity, furnaces, filters, dust mite/pet prevention, HEPA vacuums. Discussion about weather changes, air quality and the benefits of nasal washing. Instruction on Warning signs, infection symptoms, calling MD promptly, preventive modes, and value of vaccinations. Review of effective airway clearance, coughing and/or vibration techniques. Emphasizing that all should Create an Action Plan. Written material given at graduation. Flowsheet Row Pulmonary Rehab from 08/12/2021 in [facility] Cardiac and Pulmonary Rehab  Date 08/12/21       AED/CPR: - Group verbal and written instruction with the use of models to demonstrate the basic use of the AED with the basic ABC's of resuscitation.  Anatomy and Cardiac Procedures: - Group verbal and visual presentation and models provide information about basic cardiac anatomy and function. Reviews the testing methods done to diagnose heart disease and the outcomes of the test results. Describes the treatment choices: Medical Management, Angioplasty, or Coronary Bypass Surgery for treating various heart conditions including Myocardial Infarction, Angina, Valve Disease, and Cardiac Arrhythmias.  Written material given at graduation.   Medication Safety: - Group verbal and visual instruction to review commonly prescribed medications for heart and  lung disease. Reviews the medication, class of the drug, and side effects. Includes the steps to properly store meds and maintain the prescription regimen.  Written material given at graduation.   Other: -Provides group and verbal instruction on various topics (see comments)   Knowledge Questionnaire Score:  Knowledge Questionnaire Score - 08/12/21 1532       Knowledge Questionnaire Score   Pre Score 15/18              Core Components/Risk Factors/Patient Goals at Admission:  Personal Goals and Risk Factors at Admission - 08/12/21 1538       Core Components/Risk Factors/Patient Goals on Admission    Weight Management Yes;Weight Loss    Intervention Weight Management: Develop a combined nutrition and exercise program designed to reach desired caloric intake, while maintaining appropriate intake of nutrient and fiber, sodium and fats, and appropriate energy expenditure required for the weight goal.;Weight Management: Provide education and appropriate resources to help participant work on and attain dietary goals.;Weight Management/Obesity: Establish reasonable short term and long term weight goals.    Admit Weight 172 lb 11.2 oz (78.3 kg)    Goal Weight: Short Term 175 lb (79.4 kg)    Goal Weight: Long Term 170 lb (77.1 kg)    Expected Outcomes Short Term: Continue to assess and modify interventions until short term weight is achieved;Long Term: Adherence to nutrition and physical activity/exercise program aimed toward attainment of established weight goal;Weight Loss: Understanding of general recommendations for a balanced deficit meal plan, which promotes 1-2 lb weight loss per week and includes a negative energy balance of [phone] kcal/d;Understanding recommendations for meals to include 15-35% energy as protein, 25-35% energy from fat, 35-60% energy from carbohydrates, less than 227m of dietary cholesterol, 20-35 gm of total fiber daily;Understanding of distribution of calorie  intake throughout the day with the consumption of 4-5 meals/snacks    Improve shortness of breath with ADL's Yes    Intervention Provide education, individualized exercise plan and daily activity instruction to help decrease symptoms of SOB with activities of daily living.    Expected Outcomes Short Term: Improve cardiorespiratory fitness to achieve a reduction of symptoms when performing ADLs;Long Term: Be able to perform more ADLs without symptoms or delay the onset of symptoms    Diabetes Yes    Intervention Provide education about signs/symptoms and action to take for hypo/hyperglycemia.;Provide education about proper nutrition, including hydration, and aerobic/resistive exercise prescription along with prescribed medications to achieve blood glucose in normal ranges: Fasting glucose 65-99 mg/dL    Expected Outcomes Short Term: Participant verbalizes understanding of the signs/symptoms and immediate care of hyper/hypoglycemia, proper foot care and importance of medication, aerobic/resistive exercise and nutrition plan for blood glucose control.;Long Term: Attainment of HbA1C < 7%.    Hypertension Yes    Intervention Provide education on lifestyle modifcations including regular physical activity/exercise, weight management, moderate sodium restriction and increased consumption of fresh fruit, vegetables, and low fat dairy, alcohol moderation, and smoking cessation.;Monitor prescription use  compliance.    Expected Outcomes Short Term: Continued assessment and intervention until BP is < 140/35m HG in hypertensive participants. < 130/856mHG in hypertensive participants with diabetes, heart failure or chronic kidney disease.;Long Term: Maintenance of blood pressure at goal levels.    Lipids Yes    Intervention Provide education and support for participant on nutrition & aerobic/resistive exercise along with prescribed medications to achieve LDL <709mHDL >47m11m  Expected Outcomes Short Term:  Participant states understanding of desired cholesterol values and is compliant with medications prescribed. Participant is following exercise prescription and nutrition guidelines.;Long Term: Cholesterol controlled with medications as prescribed, with individualized exercise RX and with personalized nutrition plan. Value goals: LDL < 70mg44mL > 40 mg.             Education:Diabetes - Individual verbal and written instruction to review signs/symptoms of diabetes, desired ranges of glucose level fasting, after meals and with exercise. Acknowledge that pre and post exercise glucose checks will be done for 3 sessions at entry of program. Flowsheet Row [facility]monary [facility]  Date 07/08/21  Educator JH  [facility] Hospital[facility]ruction Review Code 1- Verbalizes Understanding       Know Your Numbers and Heart Failure: - Group verbal and visual instruction to discuss disease risk factors for cardiac and pulmonary disease and treatment options.  Reviews associated critical values for Overweight/Obesity, Hypertension, Cholesterol, and Diabetes.  Discusses basics of heart failure: signs/symptoms and treatments.  Introduces Heart Failure Zone chart for action plan for heart failure.  Written material given at graduation.   Core Components/Risk Factors/Patient Goals Review:    Core Components/Risk Factors/Patient Goals at Discharge (Final Review):    ITP Comments:  ITP Comments     Row Name 07/08/21 1045           ITP Comments Virtual Visit completed. Patient informed on EP and RD appointment and 6 Minute walk test. Patient also informed of patient health questionnaires on My Chart. Patient Verbalizes understanding. Visit diagnosis can be found in [facility]/2022.                Comments: initial ITP

## 2021-08-12 NOTE — Patient Instructions (Signed)
Patient Instructions  Patient Details  Name: Kirk Robinson MRN: [id number] Date of Birth: 1956/05/01 Referring Provider:  Erby Pian, MD  Below are your personal goals for exercise, nutrition, and risk factors. Our goal is to help you stay on track towards obtaining and maintaining these goals. We will be discussing your progress on these goals with you throughout the program.  Initial Exercise Prescription:  Initial Exercise Prescription - 08/12/21 1500       Date of Initial Exercise RX and Referring Provider   Date 08/12/21    Referring Provider Raul Del      Oxygen   Oxygen Continuous    Liters 3    Maintain Oxygen Saturation 88% or higher      Treadmill   MPH 1    Grade 0.5    Minutes 15    METs 1.8      Recumbant Bike   Level 1    RPM 60    Minutes 15    METs 2      NuStep   Level 1    SPM 80    Minutes 15    METs 2      Arm Ergometer   Level 1    RPM 25    Minutes 15    METs 2      Prescription Details   Frequency (times per week) 2    Duration Progress to 30 minutes of continuous aerobic without signs/symptoms of physical distress      Intensity   THRR 40-80% of Max Heartrate 114-143    Ratings of Perceived Exertion 11-15    Perceived Dyspnea 0-4      Resistance Training   Training Prescription Yes    Weight 3 lb    Reps 10-15             Exercise Goals: Frequency: Be able to perform aerobic exercise two to three times per week in program working toward 2-5 days per week of home exercise.  Intensity: Work with a perceived exertion of 11 (fairly light) - 15 (hard) while following your exercise prescription.  We will make changes to your prescription with you as you progress through the program.   Duration: Be able to do 30 to 45 minutes of continuous aerobic exercise in addition to a 5 minute warm-up and a 5 minute cool-down routine.   Nutrition Goals: Your personal nutrition goals will be established when you do your nutrition  analysis with the dietician.  The following are general nutrition guidelines to follow: Cholesterol < 200mg /day Sodium < 1500mg /day Fiber: Men over 50 yrs - 30 grams per day  Personal Goals:  Personal Goals and Risk Factors at Admission - 08/12/21 1538       Core Components/Risk Factors/Patient Goals on Admission    Weight Management Yes;Weight Loss    Intervention Weight Management: Develop a combined nutrition and exercise program designed to reach desired caloric intake, while maintaining appropriate intake of nutrient and fiber, sodium and fats, and appropriate energy expenditure required for the weight goal.;Weight Management: Provide education and appropriate resources to help participant work on and attain dietary goals.;Weight Management/Obesity: Establish reasonable short term and long term weight goals.    Admit Weight 172 lb 11.2 oz (78.3 kg)    Goal Weight: Short Term 175 lb (79.4 kg)    Goal Weight: Long Term 170 lb (77.1 kg)    Expected Outcomes Short Term: Continue to assess and modify interventions until short term weight is  achieved;Long Term: Adherence to nutrition and physical activity/exercise program aimed toward attainment of established weight goal;Weight Loss: Understanding of general recommendations for a balanced deficit meal plan, which promotes 1-2 lb weight loss per week and includes a negative energy balance of [phone] kcal/d;Understanding recommendations for meals to include 15-35% energy as protein, 25-35% energy from fat, 35-60% energy from carbohydrates, less than 200mg  of dietary cholesterol, 20-35 gm of total fiber daily;Understanding of distribution of calorie intake throughout the day with the consumption of 4-5 meals/snacks    Improve shortness of breath with ADL's Yes    Intervention Provide education, individualized exercise plan and daily activity instruction to help decrease symptoms of SOB with activities of daily living.    Expected Outcomes Short  Term: Improve cardiorespiratory fitness to achieve a reduction of symptoms when performing ADLs;Long Term: Be able to perform more ADLs without symptoms or delay the onset of symptoms    Diabetes Yes    Intervention Provide education about signs/symptoms and action to take for hypo/hyperglycemia.;Provide education about proper nutrition, including hydration, and aerobic/resistive exercise prescription along with prescribed medications to achieve blood glucose in normal ranges: Fasting glucose 65-99 mg/dL    Expected Outcomes Short Term: Participant verbalizes understanding of the signs/symptoms and immediate care of hyper/hypoglycemia, proper foot care and importance of medication, aerobic/resistive exercise and nutrition plan for blood glucose control.;Long Term: Attainment of HbA1C < 7%.    Hypertension Yes    Intervention Provide education on lifestyle modifcations including regular physical activity/exercise, weight management, moderate sodium restriction and increased consumption of fresh fruit, vegetables, and low fat dairy, alcohol moderation, and smoking cessation.;Monitor prescription use compliance.    Expected Outcomes Short Term: Continued assessment and intervention until BP is < 140/100mm HG in hypertensive participants. < 130/68mm HG in hypertensive participants with diabetes, heart failure or chronic kidney disease.;Long Term: Maintenance of blood pressure at goal levels.    Lipids Yes    Intervention Provide education and support for participant on nutrition & aerobic/resistive exercise along with prescribed medications to achieve LDL 70mg , HDL >40mg .    Expected Outcomes Short Term: Participant states understanding of desired cholesterol values and is compliant with medications prescribed. Participant is following exercise prescription and nutrition guidelines.;Long Term: Cholesterol controlled with medications as prescribed, with individualized exercise RX and with personalized nutrition  plan. Value goals: LDL < 70mg , HDL > 40 mg.             Tobacco Use Initial Evaluation: Social History   Tobacco Use  Smoking Status Former   Packs/day: 1.00   Years: 30.00   Pack years: 30.00   Types: Cigarettes   Quit date: 08/22/2020   Years since quitting: 0.9  Smokeless Tobacco Never    Exercise Goals and Review:  Exercise Goals     Row Name 08/12/21 1530             Exercise Goals   Increase Physical Activity Yes       Intervention Provide advice, education, support and counseling about physical activity/exercise needs.;Develop an individualized exercise prescription for aerobic and resistive training based on initial evaluation findings, risk stratification, comorbidities and participant's personal goals.       Expected Outcomes Short Term: Attend rehab on a regular basis to increase amount of physical activity.;Long Term: Add in home exercise to make exercise part of routine and to increase amount of physical activity.;Long Term: Exercising regularly at least 3-5 days a week.       Increase Strength and  Stamina Yes       Intervention Provide advice, education, support and counseling about physical activity/exercise needs.       Expected Outcomes Short Term: Increase workloads from initial exercise prescription for resistance, speed, and METs.;Short Term: Perform resistance training exercises routinely during rehab and add in resistance training at home;Long Term: Improve cardiorespiratory fitness, muscular endurance and strength as measured by increased METs and functional capacity (6MWT)       Able to understand and use rate of perceived exertion (RPE) scale Yes       Intervention Provide education and explanation on how to use RPE scale       Expected Outcomes Short Term: Able to use RPE daily in rehab to express subjective intensity level;Long Term:  Able to use RPE to guide intensity level when exercising independently       Able to understand and use Dyspnea scale  Yes       Intervention Provide education and explanation on how to use Dyspnea scale       Expected Outcomes Short Term: Able to use Dyspnea scale daily in rehab to express subjective sense of shortness of breath during exertion;Long Term: Able to use Dyspnea scale to guide intensity level when exercising independently       Knowledge and understanding of Target Heart Rate Range (THRR) Yes       Intervention Provide education and explanation of THRR including how the numbers were predicted and where they are located for reference       Expected Outcomes Short Term: Able to state/look up THRR;Short Term: Able to use daily as guideline for intensity in rehab;Long Term: Able to use THRR to govern intensity when exercising independently       Able to check pulse independently Yes       Intervention Provide education and demonstration on how to check pulse in carotid and radial arteries.;Review the importance of being able to check your own pulse for safety during independent exercise       Expected Outcomes Short Term: Able to explain why pulse checking is important during independent exercise;Long Term: Able to check pulse independently and accurately       Understanding of Exercise Prescription Yes       Intervention Provide education, explanation, and written materials on patient's individual exercise prescription       Expected Outcomes Short Term: Able to explain program exercise prescription;Long Term: Able to explain home exercise prescription to exercise independently                Copy of goals given to participant.

## 2021-08-18 ENCOUNTER — Encounter: Payer: Self-pay | Admitting: *Deleted

## 2021-08-18 DIAGNOSIS — J8410 Pulmonary fibrosis, unspecified: Secondary | ICD-10-CM

## 2021-08-18 NOTE — Progress Notes (Signed)
Pulmonary Individual Treatment Plan  Patient Details  Name: Kirk Robinson MRN: [id number] Date of Birth: 12-27-1955 Referring Provider:   Flowsheet Row Pulmonary Rehab from 08/12/2021 in [facility] Cardiac and Pulmonary Rehab  Referring Provider Raul Del       Initial Encounter Date:  Flowsheet Row Pulmonary Rehab from 08/12/2021 in [facility] Cardiac and Pulmonary Rehab  Date 08/12/21       Visit Diagnosis: Pulmonary fibrosis ([place])  Patient's Home Medications on Admission:  Current Outpatient Medications:    Accu-Chek FastClix Lancets MISC, Apply topically daily., Disp: , Rfl:    albuterol (VENTOLIN HFA) 108 (90 Base) MCG/ACT inhaler, Inhale 2 puffs into the lungs every 6 (six) hours as needed for wheezing or shortness of breath., Disp: 18 g, Rfl: 0   alendronate (FOSAMAX) 70 MG tablet, Take by mouth. (Patient not taking: Reported on 06/11/2020), Disp: , Rfl:    ARIPiprazole (ABILIFY) 2 MG tablet, Take 2 mg by mouth daily., Disp: , Rfl:    ARMOUR THYROID 120 MG tablet, Take 120 mg by mouth every morning., Disp: , Rfl:    aspirin EC 81 MG tablet, Take 1 tablet (81 mg total) by mouth daily., Disp: 150 tablet, Rfl: 2   atorvastatin (LIPITOR) 20 MG tablet, Take 1 tablet (20 mg total) by mouth daily at 6 PM., Disp: 90 tablet, Rfl: 3   Blood Glucose Monitoring Suppl (ACCU-CHEK GUIDE) w/Device KIT, See admin instructions., Disp: , Rfl:    BREZTRI AEROSPHERE 160-9-4.8 MCG/ACT AERO, SMARTSIG:2 Inhalation Via Inhaler Twice Daily, Disp: , Rfl:    clobetasol ointment (TEMOVATE) 0.05 %, Apply topically 2 (two) times daily. (Patient not taking: Reported on 07/08/2021), Disp: , Rfl:    clopidogrel (PLAVIX) 75 MG tablet, TAKE 1 TABLET BY MOUTH EVERY DAY, Disp: 90 tablet, Rfl: 3   diazepam (VALIUM) 5 MG tablet, Take 5 mg by mouth 2 (two) times daily. , Disp: , Rfl:    diazepam (VALIUM) 5 MG tablet, Take by mouth. (Patient not taking: Reported on 07/08/2021), Disp: , Rfl:    diltiazem (CARDIZEM CD) 120 MG 24  hr capsule, Take by mouth daily., Disp: , Rfl:    Docusate Sodium 100 MG capsule, Take 1 tablet by mouth in the morning, at noon, and at bedtime. (Patient not taking: Reported on 07/08/2021), Disp: , Rfl:    Ensure Max Protein (ENSURE MAX PROTEIN) LIQD, Take 330 mLs (11 oz total) by mouth 2 (two) times daily. (Patient not taking: Reported on 06/11/2020), Disp: 330 mL, Rfl: 0   ESBRIET 267 MG TABS, Take 3 tablets by mouth 3 (three) times daily as needed., Disp: , Rfl:    famotidine (PEPCID) 20 MG tablet, Take by mouth., Disp: , Rfl:    glucose blood test strip, See admin instructions., Disp: , Rfl:    hydrocortisone 2.5 % ointment, Apply topically 2 (two) times daily., Disp: , Rfl:    linagliptin (TRADJENTA) 5 MG TABS tablet, Take 1 tablet (5 mg total) by mouth daily. (Patient not taking: Reported on 07/08/2021), Disp: 30 tablet, Rfl: 0   metFORMIN (GLUCOPHAGE) 1000 MG tablet, TAKE 1 TABLET BY MOUTH 2 TIMES DAILY WITH MEAL. (Patient taking differently: Take 1,000 mg by mouth 2 (two) times daily with a meal.), Disp: 180 tablet, Rfl: 0   metoprolol succinate (TOPROL-XL) 25 MG 24 hr tablet, Take by mouth., Disp: , Rfl:    metoprolol succinate (TOPROL-XL) 25 MG 24 hr tablet, Take 1 tablet by mouth daily., Disp: , Rfl:    mirtazapine (REMERON) 15 MG  tablet, Take by mouth., Disp: , Rfl:    mirtazapine (REMERON) 30 MG tablet, Take 30 mg by mouth at bedtime., Disp: , Rfl:    morphine (MS CONTIN) 30 MG 12 hr tablet, SMARTSIG:1 Tablet(s) By Mouth Every 12 Hours, Disp: , Rfl:    naloxone (NARCAN) nasal spray 4 mg/0.1 mL, Place into the nose., Disp: , Rfl:    pantoprazole (PROTONIX) 40 MG tablet, Take by mouth., Disp: , Rfl:    Pirfenidone 267 MG TABS, Take by mouth., Disp: , Rfl:    polyethylene glycol (MIRALAX / GLYCOLAX) 17 g packet, Take 17 g by mouth daily., Disp: 30 each, Rfl: 0   polyethylene glycol-electrolytes (NULYTELY) 420 g solution, Prepare according to package instructions. Starting at 5:00 PM:  Drink one 8 oz glass of mixture every 15 minutes until you finish half of the jug. Five hours prior to procedure, drink 8 oz glass of mixture every 15 minutes until it is all gone. Make sure you do not drink anything 4 hours prior to your procedure. (Patient not taking: Reported on 07/08/2021), Disp: 8000 mL, Rfl: 0   predniSONE (DELTASONE) 10 MG tablet, Take 20 mg by mouth daily with breakfast., Disp: , Rfl:    predniSONE (DELTASONE) 10 MG tablet, Take 5 mg by mouth every evening.  (Patient not taking: Reported on 07/08/2021), Disp: , Rfl:    predniSONE (DELTASONE) 10 MG tablet, Take 1 tablet by mouth daily. (Patient not taking: Reported on 07/08/2021), Disp: , Rfl:    rOPINIRole (REQUIP) 0.5 MG tablet, Take by mouth., Disp: , Rfl:    sitaGLIPtin (JANUVIA) 100 MG tablet, Take by mouth., Disp: , Rfl:    tapentadol HCl (NUCYNTA) 75 MG tablet, Take 75 mg by mouth every 4 (four) hours as needed for moderate pain or severe pain.  (Patient not taking: Reported on 06/11/2020), Disp: , Rfl:    thiamine 100 MG tablet, Take 1 tablet (100 mg total) by mouth daily. (Patient not taking: Reported on 06/11/2020), Disp: 30 tablet, Rfl: 0   venlafaxine XR (EFFEXOR-XR) 150 MG 24 hr capsule, Take 1 capsule (150 mg total) by mouth daily with breakfast., Disp: 90 capsule, Rfl: 1   Vitamin D, Ergocalciferol, (DRISDOL) 1.25 MG (50000 UNIT) CAPS capsule, Take by mouth., Disp: , Rfl:   Past Medical History: Past Medical History:  Diagnosis Date   Alcohol use 03/16/2007   patient risk factors   Allergic rhinitis, cause unspecified    Altered mental status    Anxiety state, unspecified    Cervicalgia 09/17/2007   COPD (chronic obstructive pulmonary disease) ([place])    Diabetes mellitus without complication ([place])    Diplopia 12/21/2007   Esophageal reflux    Gastritis 05/13/2009   HCAP (healthcare-associated pneumonia) 08/12/2014   Hematuria 03/16/2007   Hyperthyroidism    graves disease   Iodine hypothyroidism     Memory loss    NSIP (nonspecific interstitial pneumonia) ([place])    on azathioprine   Osteoporosis, unspecified    bone density per Mor[facility] in 2012   Other abnormal glucose 04/08/2008   Other diseases of lung, not elsewhere classified 01/23/2009   s/p [place]/pulmonology consult; intolerant  to Advair   Peripheral vascular disease, unspecified ([place]) 12/24/2009   Personal history of colonic polyps    Pneumonia, organism unspecified([place])    Pure hypercholesterolemia    Shortness of breath    Stroke ([place]) 2005   Tobacco use disorder 03/16/2007   patient risk factors   Unspecified hypothyroidism 01/07/2010  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    Tobacco Use: Social History   Tobacco Use  Smoking Status Former   Packs/day: 1.00   Years: 30.00   Pack years: 30.00   Types: Cigarettes   Quit date: 08/22/2020   Years since quitting: 0.9  Smokeless Tobacco Never    Labs: Recent Review Flowsheet Data     Labs for ITP Cardiac and Pulmonary Rehab Latest Ref Rng & Units 07/12/2016 03/01/2017 10/18/2017 01/24/2018 05/11/2019   Cholestrol 100 - 199 mg/dL 167 159 170 191 -   LDLCALC 0 - 99 mg/dL 91 85 72 87 -   HDL >39 mg/dL 53 49 56 47 -   Trlycerides 0 - 149 mg/dL 117 123 211(H) 283(H) -   Hemoglobin A1c 4.8 - 5.6 % 6.2(H) 6.0 6.6(H) 6.5(H) 13.2(H)   PHART 7.350 - 7.450 - - - - -   PCO2ART 35.0 - 45.0 mmHg - - - - -   HCO3 20.0 - 28.0 mmol/L - - - - 38.6(H)   TCO2 0 - 100 mmol/L - - - - -   O2SAT % - - - - 96.8        Pulmonary Assessment Scores:  Pulmonary Assessment Scores     Row Name 08/12/21 1536         ADL UCSD   SOB Score total 91     Rest 1     Walk 3     Stairs 4     Bath 4     Dress 4     Shop 5       CAT Score   CAT Score 20       mMRC Score   mMRC Score 2              UCSD: Self-administered rating of dyspnea associated with activities of daily living (ADLs) 6-point scale (0 = "not at all" to 5 = "maximal or unable to  do because of breathlessness")  Scoring Scores range from 0 to 120.  Minimally important difference is 5 units  CAT: CAT can identify the health impairment of COPD patients and is better correlated with disease progression.  CAT has a scoring range of zero to 40. The CAT score is classified into four groups of low (less than 10), medium (10 - 20), high (21-30) and very high (31-40) based on the impact level of disease on health status. A CAT score over 10 suggests significant symptoms.  A worsening CAT score could be explained by an exacerbation, poor medication adherence, poor inhaler technique, or progression of COPD or comorbid conditions.  CAT MCID is 2 points  mMRC: mMRC (Modified Medical Research Council) Dyspnea Scale is used to assess the degree of baseline functional disability in patients of respiratory disease due to dyspnea. No minimal important difference is established. A decrease in score of 1 point or greater is considered a positive change.   Pulmonary Function Assessment:  Pulmonary Function Assessment - 07/08/21 1048       Breath   Shortness of Breath Yes;Limiting activity             Exercise Target Goals: Exercise Program Goal: Individual exercise prescription set using results from initial 6 min walk test and THRR while considering  patients activity barriers and safety.   Exercise Prescription Goal: Initial exercise prescription builds to 30-45 minutes a day of aerobic activity, 2-3 days per week.  Home exercise guidelines will be given to patient during  program as part of exercise prescription that the participant will acknowledge.  Education: Aerobic Exercise: - Group verbal and visual presentation on the components of exercise prescription. Introduces F.I.T.T principle from ACSM for exercise prescriptions.  Reviews F.I.T.T. principles of aerobic exercise including progression. Written material given at graduation.   Education: Resistance Exercise: -  Group verbal and visual presentation on the components of exercise prescription. Introduces F.I.T.T principle from ACSM for exercise prescriptions  Reviews F.I.T.T. principles of resistance exercise including progression. Written material given at graduation.    Education: Exercise & Equipment Safety: - Individual verbal instruction and demonstration of equipment use and safety with use of the equipment. Flowsheet Row Pulmonary Rehab from 08/12/2021 in [facility] Cardiac and Pulmonary Rehab  Date 08/12/21  Educator AS  Instruction Review Code 1- Verbalizes Understanding       Education: Exercise Physiology & General Exercise Guidelines: - Group verbal and written instruction with models to review the exercise physiology of the cardiovascular system and associated critical values. Provides general exercise guidelines with specific guidelines to those with heart or lung disease.  Flowsheet Row Pulmonary Rehab from 08/12/2021 in [facility] Cardiac and Pulmonary Rehab  Education need identified 08/12/21       Education: Flexibility, Balance, Mind/Body Relaxation: - Group verbal and visual presentation with interactive activity on the components of exercise prescription. Introduces F.I.T.T principle from ACSM for exercise prescriptions. Reviews F.I.T.T. principles of flexibility and balance exercise training including progression. Also discusses the mind body connection.  Reviews various relaxation techniques to help reduce and manage stress (i.e. Deep breathing, progressive muscle relaxation, and visualization). Balance handout provided to take home. Written material given at graduation.   Activity Barriers & Risk Stratification:   6 Minute Walk:  6 Minute Walk     Row Name 08/12/21 1521         6 Minute Walk   Phase Initial     Distance 500 feet     Walk Time 4.5 minutes     # of Rest Breaks 1     MPH 1.3     METS 2.2     RPE 15     Perceived Dyspnea  2     VO2 Peak 7.7     Symptoms No      Resting HR 86 bpm     Resting BP 118/62     Resting Oxygen Saturation  96 %     Exercise Oxygen Saturation  during 6 min walk 84 %     Max Ex. HR 118 bpm     Max Ex. BP 130/64     2 Minute Post BP 110/62       Interval HR   1 Minute HR 106     2 Minute HR 114     3 Minute HR 115     4 Minute HR 118     5 Minute HR 101     6 Minute HR 103     Interval Heart Rate? Yes       Interval Oxygen   Interval Oxygen? Yes     Baseline Oxygen Saturation % 96 %     1 Minute Oxygen Saturation % 89 %     1 Minute Liters of Oxygen 3 L  pulsed     2 Minute Oxygen Saturation % 88 %     2 Minute Liters of Oxygen 3 L  pulsed     3 Minute Oxygen Saturation % 86 %  3 Minute Liters of Oxygen 3 L  pulsed     4 Minute Oxygen Saturation % 84 %  stopped to rest - fatigue     4 Minute Liters of Oxygen 3 L  pulsed     5 Minute Oxygen Saturation % 85 %     5 Minute Liters of Oxygen 3 L  pulsed     6 Minute Oxygen Saturation % 90 %     6 Minute Liters of Oxygen 3 L  pulsed     2 Minute Post Oxygen Saturation % 91 %     2 Minute Post Liters of Oxygen 3 L  pulsed             Oxygen Initial Assessment:  Oxygen Initial Assessment - 07/08/21 1046       Home Oxygen   Home Oxygen Device Portable Concentrator;Home Concentrator;E-Tanks    Sleep Oxygen Prescription Continuous    Liters per minute 3.5    Home Exercise Oxygen Prescription Continuous    Liters per minute 3.5    Home Resting Oxygen Prescription Continuous    Liters per minute 3.5    Compliance with Home Oxygen Use Yes      Initial 6 min Walk   Oxygen Used Pulsed;Portable Concentrator    Liters per minute 3      Program Oxygen Prescription   Program Oxygen Prescription Continuous    Liters per minute 3      Intervention   Short Term Goals To learn and exhibit compliance with exercise, home and travel O2 prescription;To learn and understand importance of monitoring SPO2 with pulse oximeter and demonstrate accurate use of the  pulse oximeter.;To learn and understand importance of maintaining oxygen saturations>88%;To learn and demonstrate proper use of respiratory medications;To learn and demonstrate proper pursed lip breathing techniques or other breathing techniques.     Long  Term Goals Exhibits compliance with exercise, home  and travel O2 prescription;Verbalizes importance of monitoring SPO2 with pulse oximeter and return demonstration;Maintenance of O2 saturations>88%;Exhibits proper breathing techniques, such as pursed lip breathing or other method taught during program session;Compliance with respiratory medication;Demonstrates proper use of MDIs             Oxygen Re-Evaluation:   Oxygen Discharge (Final Oxygen Re-Evaluation):   Initial Exercise Prescription:  Initial Exercise Prescription - 08/12/21 1500       Date of Initial Exercise RX and Referring Provider   Date 08/12/21    Referring Provider Raul Del      Oxygen   Oxygen Continuous    Liters 3    Maintain Oxygen Saturation 88% or higher      Treadmill   MPH 1    Grade 0.5    Minutes 15    METs 1.8      Recumbant Bike   Level 1    RPM 60    Minutes 15    METs 2      NuStep   Level 1    SPM 80    Minutes 15    METs 2      Arm Ergometer   Level 1    RPM 25    Minutes 15    METs 2      Prescription Details   Frequency (times per week) 2    Duration Progress to 30 minutes of continuous aerobic without signs/symptoms of physical distress      Intensity   THRR 40-80% of Max Heartrate 114-143  Ratings of Perceived Exertion 11-15    Perceived Dyspnea 0-4      Resistance Training   Training Prescription Yes    Weight 3 lb    Reps 10-15             Perform Capillary Blood Glucose checks as needed.  Exercise Prescription Changes:   Exercise Prescription Changes     Row Name 08/12/21 1500             Response to Exercise   Blood Pressure (Admit) 118/62       Blood Pressure (Exercise) 130/64        Blood Pressure (Exit) 110/62       Heart Rate (Admit) 86 bpm       Heart Rate (Exercise) 118 bpm       Heart Rate (Exit) 106 bpm       Oxygen Saturation (Admit) 96 %       Oxygen Saturation (Exercise) 84 %       Oxygen Saturation (Exit) 91 %       Rating of Perceived Exertion (Exercise) 15       Perceived Dyspnea (Exercise) 2       Symptoms none                Exercise Comments:   Exercise Goals and Review:   Exercise Goals     Row Name 08/12/21 1530             Exercise Goals   Increase Physical Activity Yes       Intervention Provide advice, education, support and counseling about physical activity/exercise needs.;Develop an individualized exercise prescription for aerobic and resistive training based on initial evaluation findings, risk stratification, comorbidities and participant's personal goals.       Expected Outcomes Short Term: Attend rehab on a regular basis to increase amount of physical activity.;Long Term: Add in home exercise to make exercise part of routine and to increase amount of physical activity.;Long Term: Exercising regularly at least 3-5 days a week.       Increase Strength and Stamina Yes       Intervention Provide advice, education, support and counseling about physical activity/exercise needs.       Expected Outcomes Short Term: Increase workloads from initial exercise prescription for resistance, speed, and METs.;Short Term: Perform resistance training exercises routinely during rehab and add in resistance training at home;Long Term: Improve cardiorespiratory fitness, muscular endurance and strength as measured by increased METs and functional capacity (6MWT)       Able to understand and use rate of perceived exertion (RPE) scale Yes       Intervention Provide education and explanation on how to use RPE scale       Expected Outcomes Short Term: Able to use RPE daily in rehab to express subjective intensity level;Long Term:  Able to use RPE to guide  intensity level when exercising independently       Able to understand and use Dyspnea scale Yes       Intervention Provide education and explanation on how to use Dyspnea scale       Expected Outcomes Short Term: Able to use Dyspnea scale daily in rehab to express subjective sense of shortness of breath during exertion;Long Term: Able to use Dyspnea scale to guide intensity level when exercising independently       Knowledge and understanding of Target Heart Rate Range (THRR) Yes       Intervention Provide education and  explanation of THRR including how the numbers were predicted and where they are located for reference       Expected Outcomes Short Term: Able to state/look up THRR;Short Term: Able to use daily as guideline for intensity in rehab;Long Term: Able to use THRR to govern intensity when exercising independently       Able to check pulse independently Yes       Intervention Provide education and demonstration on how to check pulse in carotid and radial arteries.;Review the importance of being able to check your own pulse for safety during independent exercise       Expected Outcomes Short Term: Able to explain why pulse checking is important during independent exercise;Long Term: Able to check pulse independently and accurately       Understanding of Exercise Prescription Yes       Intervention Provide education, explanation, and written materials on patient's individual exercise prescription       Expected Outcomes Short Term: Able to explain program exercise prescription;Long Term: Able to explain home exercise prescription to exercise independently                Exercise Goals Re-Evaluation :   Discharge Exercise Prescription (Final Exercise Prescription Changes):  Exercise Prescription Changes - 08/12/21 1500       Response to Exercise   Blood Pressure (Admit) 118/62    Blood Pressure (Exercise) 130/64    Blood Pressure (Exit) 110/62    Heart Rate (Admit) 86 bpm     Heart Rate (Exercise) 118 bpm    Heart Rate (Exit) 106 bpm    Oxygen Saturation (Admit) 96 %    Oxygen Saturation (Exercise) 84 %    Oxygen Saturation (Exit) 91 %    Rating of Perceived Exertion (Exercise) 15    Perceived Dyspnea (Exercise) 2    Symptoms none             Nutrition:  Target Goals: Understanding of nutrition guidelines, daily intake of sodium <1518m, cholesterol <2044m calories 30% from fat and 7% or less from saturated fats, daily to have 5 or more servings of fruits and vegetables.  Education: All About Nutrition: -Group instruction provided by verbal, written material, interactive activities, discussions, models, and posters to present general guidelines for heart healthy nutrition including fat, fiber, MyPlate, the role of sodium in heart healthy nutrition, utilization of the nutrition label, and utilization of this knowledge for meal planning. Follow up email sent as well. Written material given at graduation.   Biometrics:  Pre Biometrics - 08/12/21 1531       Pre Biometrics   Height '5\' 9"'  (1.753 m)    Weight 172 lb 11.2 oz (78.3 kg)    BMI (Calculated) 25.49    Single Leg Stand 1.78 seconds              Nutrition Therapy Plan and Nutrition Goals:   Nutrition Assessments:  MEDIFICTS Score Key: ?70 Need to make dietary changes  40-70 Heart Healthy Diet ? 40 Therapeutic Level Cholesterol Diet  [facility]  Picture Your Plate Total Score on Admission 71      Picture Your Plate Scores: <4<86nhealthy dietary pattern with much room for improvement. 41-50 Dietary pattern unlikely to meet recommendations for good health and room for improvement. 51-60 More healthful dietary pattern, with some room for improvement.  >60 Healthy dietary pattern, although there may be some specific behaviors that could  be improved.   Nutrition Goals Re-Evaluation:   Nutrition Goals  Discharge (Final Nutrition Goals Re-Evaluation):   Psychosocial: Target Goals: Acknowledge presence or absence of significant depression and/or stress, maximize coping skills, provide positive support system. Participant is able to verbalize types and ability to use techniques and skills needed for reducing stress and depression.   Education: Stress, Anxiety, and Depression - Group verbal and visual presentation to define topics covered.  Reviews how body is impacted by stress, anxiety, and depression.  Also discusses healthy ways to reduce stress and to treat/manage anxiety and depression.  Written material given at graduation.   Education: Sleep Hygiene -Provides group verbal and written instruction about how sleep can affect your health.  Define sleep hygiene, discuss sleep cycles and impact of sleep habits. Review good sleep hygiene tips.    Initial Review & Psychosocial Screening:  Initial Psych Review & Screening - 07/08/21 1049       Initial Review   Current issues with Current Psychotropic Meds;History of Depression;Current Depression;Current Stress Concerns    Source of Stress Concerns Chronic Illness    Comments He has anxiety and nothing in particular sets him off. His depression gets to him sometimes and will come and go.      Family Dynamics   Good Support System? Yes    Comments He can look to his wife for support. He can call his children if he needed help.      Barriers   Psychosocial barriers to participate in program There are no identifiable barriers or psychosocial needs.;The patient should benefit from training in stress management and relaxation.      Screening Interventions   Interventions Encouraged to exercise;To provide support and resources with identified psychosocial needs;Provide feedback about the scores to participant    Expected Outcomes Long Term Goal: Stressors or current issues are controlled or eliminated.;Short Term goal: Utilizing psychosocial  counselor, staff and physician to assist with identification of specific Stressors or current issues interfering with healing process. Setting desired goal for each stressor or current issue identified.;Short Term goal: Identification and review with participant of any Quality of Life or Depression concerns found by scoring the questionnaire.;Long Term goal: The participant improves quality of Life and PHQ9 Scores as seen by post scores and/or verbalization of changes             Quality of Life Scores:  Scores of 19 and below usually indicate a poorer quality of life in these areas.  A difference of  2-3 points is a clinically meaningful difference.  A difference of 2-3 points in the total score of the Quality of Life Index has been associated with significant improvement in overall quality of life, self-image, physical symptoms, and general health in studies assessing change in quality of life.  PHQ-9: Recent Review Flowsheet Data     Depression screen [facility] 2/9 08/12/2021 01/24/2018 10/18/2017 03/01/2017 07/12/2016   Decreased Interest 3 0 1 1 0   Down, Depressed, Hopeless 2 0 1 1 0   PHQ - 2 Score 5 0 2 2 0   Altered sleeping 3  - 1 1 -   Tired, decreased energy 3 - 1 2 -   Change in appetite 2  - 1 0 -   Feeling bad or failure about yourself  2 - 1 1 -   Trouble concentrating 2 - 0 0 -   Moving slowly or fidgety/restless 1 - 0 - -   Suicidal thoughts 0 - 0  0 -   PHQ-9 Score 18 - 6 6 -   Difficult doing work/chores Very difficult - - - -      Interpretation of Total Score  Total Score Depression Severity:  1-4 = Minimal depression, 5-9 = Mild depression, 10-14 = Moderate depression, 15-19 = Moderately severe depression, 20-27 = Severe depression   Psychosocial Evaluation and Intervention:  Psychosocial Evaluation - 07/08/21 1051       Psychosocial Evaluation & Interventions   Interventions Encouraged to exercise with the program and follow exercise prescription;Stress management  education;Relaxation education    Comments He has anxiety and nothing in particular sets him off. His depression gets to him sometimes and will come and go.He can look to his wife for support. He can call his children if he needed help.    Expected Outcomes Short: Start [facility] to help with mood. Long: Maintain a healthy mental state.    Continue Psychosocial Services  Follow up required by staff             Psychosocial Re-Evaluation:   Psychosocial Discharge (Final Psychosocial Re-Evaluation):   Education: Education Goals: Education classes will be provided on a weekly basis, covering required topics. Participant will state understanding/return demonstration of topics presented.  Learning Barriers/Preferences:  Learning Barriers/Preferences - 07/08/21 1048       Learning Barriers/Preferences   Learning Barriers None    Learning Preferences None             General Pulmonary Education Topics:  Infection Prevention: - Provides verbal and written material to individual with discussion of infection control including proper hand washing and proper equipment cleaning during exercise session. Flowsheet Row Pulmonary Rehab from 08/12/2021 in [facility] Cardiac and Pulmonary Rehab  Date 08/12/21  Educator AS  Instruction Review Code 1- Verbalizes Understanding       Falls Prevention: - Provides verbal and written material to individual with discussion of falls prevention and safety. Flowsheet Row Pulmonary Rehab from 08/12/2021 in [facility] Cardiac and Pulmonary Rehab  Date 08/12/21  Educator AS  Instruction Review Code 1- Verbalizes Understanding       Chronic Lung Disease Review: - Group verbal instruction with posters, models, PowerPoint presentations and videos,  to review new updates, new respiratory medications, new advancements in procedures and treatments. Providing information on websites and "800" numbers for continued self-education. Includes information about  supplement oxygen, available portable oxygen systems, continuous and intermittent flow rates, oxygen safety, concentrators, and Medicare reimbursement for oxygen. Explanation of Pulmonary Drugs, including class, frequency, complications, importance of spacers, rinsing mouth after steroid MDI's, and proper cleaning methods for nebulizers. Review of basic lung anatomy and physiology related to function, structure, and complications of lung disease. Review of risk factors. Discussion about methods for diagnosing sleep apnea and types of masks and machines for OSA. Includes a review of the use of types of environmental controls: home humidity, furnaces, filters, dust mite/pet prevention, HEPA vacuums. Discussion about weather changes, air quality and the benefits of nasal washing. Instruction on Warning signs, infection symptoms, calling MD promptly, preventive modes, and value of vaccinations. Review of effective airway clearance, coughing and/or vibration techniques. Emphasizing that all should Create an Action Plan. Written material given at graduation. Flowsheet Row Pulmonary Rehab from 08/12/2021 in [facility] Cardiac and Pulmonary Rehab  Date 08/12/21       AED/CPR: - Group verbal and written instruction with the use of models to demonstrate the basic use of the AED with the basic ABC's of resuscitation.  Anatomy and Cardiac Procedures: - Group verbal and visual presentation and models provide information about basic cardiac anatomy and function. Reviews the testing methods done to diagnose heart disease and the outcomes of the test results. Describes the treatment choices: Medical Management, Angioplasty, or Coronary Bypass Surgery for treating various heart conditions including Myocardial Infarction, Angina, Valve Disease, and Cardiac Arrhythmias.  Written material given at graduation.   Medication Safety: - Group verbal and visual instruction to review commonly prescribed medications for heart and  lung disease. Reviews the medication, class of the drug, and side effects. Includes the steps to properly store meds and maintain the prescription regimen.  Written material given at graduation.   Other: -Provides group and verbal instruction on various topics (see comments)   Knowledge Questionnaire Score:  Knowledge Questionnaire Score - 08/12/21 1532       Knowledge Questionnaire Score   Pre Score 15/18              Core Components/Risk Factors/Patient Goals at Admission:  Personal Goals and Risk Factors at Admission - 08/12/21 1538       Core Components/Risk Factors/Patient Goals on Admission    Weight Management Yes;Weight Loss    Intervention Weight Management: Develop a combined nutrition and exercise program designed to reach desired caloric intake, while maintaining appropriate intake of nutrient and fiber, sodium and fats, and appropriate energy expenditure required for the weight goal.;Weight Management: Provide education and appropriate resources to help participant work on and attain dietary goals.;Weight Management/Obesity: Establish reasonable short term and long term weight goals.    Admit Weight 172 lb 11.2 oz (78.3 kg)    Goal Weight: Short Term 175 lb (79.4 kg)    Goal Weight: Long Term 170 lb (77.1 kg)    Expected Outcomes Short Term: Continue to assess and modify interventions until short term weight is achieved;Long Term: Adherence to nutrition and physical activity/exercise program aimed toward attainment of established weight goal;Weight Loss: Understanding of general recommendations for a balanced deficit meal plan, which promotes 1-2 lb weight loss per week and includes a negative energy balance of [phone] kcal/d;Understanding recommendations for meals to include 15-35% energy as protein, 25-35% energy from fat, 35-60% energy from carbohydrates, less than 239m of dietary cholesterol, 20-35 gm of total fiber daily;Understanding of distribution of calorie  intake throughout the day with the consumption of 4-5 meals/snacks    Improve shortness of breath with ADL's Yes    Intervention Provide education, individualized exercise plan and daily activity instruction to help decrease symptoms of SOB with activities of daily living.    Expected Outcomes Short Term: Improve cardiorespiratory fitness to achieve a reduction of symptoms when performing ADLs;Long Term: Be able to perform more ADLs without symptoms or delay the onset of symptoms    Diabetes Yes    Intervention Provide education about signs/symptoms and action to take for hypo/hyperglycemia.;Provide education about proper nutrition, including hydration, and aerobic/resistive exercise prescription along with prescribed medications to achieve blood glucose in normal ranges: Fasting glucose 65-99 mg/dL    Expected Outcomes Short Term: Participant verbalizes understanding of the signs/symptoms and immediate care of hyper/hypoglycemia, proper foot care and importance of medication, aerobic/resistive exercise and nutrition plan for blood glucose control.;Long Term: Attainment of HbA1C < 7%.    Hypertension Yes    Intervention Provide education on lifestyle modifcations including regular physical activity/exercise, weight management, moderate sodium restriction and increased consumption of fresh fruit, vegetables, and low fat dairy, alcohol moderation, and smoking cessation.;Monitor prescription use  compliance.    Expected Outcomes Short Term: Continued assessment and intervention until BP is < 140/35m HG in hypertensive participants. < 130/816mHG in hypertensive participants with diabetes, heart failure or chronic kidney disease.;Long Term: Maintenance of blood pressure at goal levels.    Lipids Yes    Intervention Provide education and support for participant on nutrition & aerobic/resistive exercise along with prescribed medications to achieve LDL <7053mHDL >69m25m  Expected Outcomes Short Term:  Participant states understanding of desired cholesterol values and is compliant with medications prescribed. Participant is following exercise prescription and nutrition guidelines.;Long Term: Cholesterol controlled with medications as prescribed, with individualized exercise RX and with personalized nutrition plan. Value goals: LDL < 70mg52mL > 40 mg.             Education:Diabetes - Individual verbal and written instruction to review signs/symptoms of diabetes, desired ranges of glucose level fasting, after meals and with exercise. Acknowledge that pre and post exercise glucose checks will be done for 3 sessions at entry of program. Flowsheet [facility]  Date 07/08/21  [facility]ator [facility] Review Code 1- Verbalizes Understanding       Know Your Numbers and Heart Failure: - Group verbal and visual instruction to discuss disease risk factors for cardiac and pulmonary disease and treatment options.  Reviews associated critical values for Overweight/Obesity, Hypertension, Cholesterol, and Diabetes.  Discusses basics of heart failure: signs/symptoms and treatments.  Introduces Heart Failure Zone chart for action plan for heart failure.  Written material given at graduation.   Core Components/Risk Factors/Patient Goals Review:    Core Components/Risk Factors/Patient Goals at Discharge (Final Review):    ITP Comments:  ITP Comments     Row Name 07/08/21 1045 08/12/21 1542 08/18/21 0833       ITP Comments Virtual Visit completed. Patient informed on EP and RD appointment and 6 Minute walk test. Patient also informed of patient health questionnaires on My Chart. Patient Verbalizes understanding. Visit diagnosis can be found in [facility]/2022. Completed 6MWT and gym orientation. Initial ITP created and sent for review to Dr. Fuad Ottie Glazierical Director. 30 Day review completed. Medical Director ITP review done, changes  made as directed, and signed approval by Medical Director.              Comments:

## 2021-08-28 ENCOUNTER — Other Ambulatory Visit (INDEPENDENT_AMBULATORY_CARE_PROVIDER_SITE_OTHER): Payer: Self-pay | Admitting: Vascular Surgery

## 2021-08-30 ENCOUNTER — Other Ambulatory Visit: Payer: Self-pay

## 2021-08-30 ENCOUNTER — Encounter: Payer: Medicare HMO | Attending: Specialist

## 2021-08-30 DIAGNOSIS — J8410 Pulmonary fibrosis, unspecified: Secondary | ICD-10-CM | POA: Diagnosis not present

## 2021-08-30 DIAGNOSIS — J849 Interstitial pulmonary disease, unspecified: Secondary | ICD-10-CM | POA: Insufficient documentation

## 2021-08-30 LAB — GLUCOSE, CAPILLARY
Glucose-Capillary: 186 mg/dL — ABNORMAL HIGH (ref 70–99)
Glucose-Capillary: 86 mg/dL (ref 70–99)

## 2021-08-30 NOTE — Progress Notes (Signed)
Daily Session Note  Patient Details  Name: Kirk Robinson MRN: [id number] Date of Birth: 07/07/1956 Referring Provider:   Flowsheet Row Pulmonary Rehab from 08/12/2021 in [facility] Cardiac and Pulmonary Rehab  Referring Provider Raul Del       Encounter Date: 08/30/2021  Check In:  Session Check In - 08/30/21 1336       Check-In   Supervising physician immediately available to respond to emergencies See telemetry face sheet for immediately available ER MD    Location [facility]-Cardiac & Pulmonary Rehab    Staff Present Birdie Sons, MPA, RN;Joseph Lou Miner, MS, ASCM CEP, Exercise Physiologist    Virtual Visit No    Medication changes reported     No    Fall or balance concerns reported    No    Warm-up and Cool-down Performed on first and last piece of equipment    Resistance Training Performed Yes    VAD Patient? No    PAD/SET Patient? No      Pain Assessment   Currently in Pain? No/denies                Social History   Tobacco Use  Smoking Status Former   Packs/day: 1.00   Years: 30.00   Pack years: 30.00   Types: Cigarettes   Quit date: 08/22/2020   Years since quitting: 1.0  Smokeless Tobacco Never    Goals Met:  Independence with exercise equipment Exercise tolerated well No report of concerns or symptoms today Strength training completed today  Goals Unmet:  Not Applicable  Comments: First full day of exercise!  Patient was oriented to gym and equipment including functions, settings, policies, and procedures.  Patient's individual exercise prescription and treatment plan were reviewed.  All starting workloads were established based on the results of the 6 minute walk test done at initial orientation visit.  The plan for exercise progression was also introduced and progression will be customized based on patient's performance and goals.    Dr. Emily Filbert is Medical Director for [place].  Dr. Ottie Glazier is Medical Director for [facility] Pulmonary Rehabilitation.

## 2021-09-01 ENCOUNTER — Other Ambulatory Visit: Payer: Self-pay

## 2021-09-01 DIAGNOSIS — J8410 Pulmonary fibrosis, unspecified: Secondary | ICD-10-CM | POA: Diagnosis not present

## 2021-09-01 LAB — GLUCOSE, CAPILLARY
Glucose-Capillary: 107 mg/dL — ABNORMAL HIGH (ref 70–99)
Glucose-Capillary: 105 mg/dL — ABNORMAL HIGH (ref 70–99)

## 2021-09-01 NOTE — Progress Notes (Signed)
Daily Session Note  Patient Details  Name: Kirk Robinson MRN: [id number] Date of Birth: [birth year] Referring Provider:   Flowsheet Row Pulmonary Rehab from 08/12/2021 in [facility] Cardiac and Pulmonary Rehab  Referring Provider Raul Del       Encounter Date: 09/01/2021  Check In:  Session Check In - 09/01/21 1328       Check-In   Supervising physician immediately available to respond to emergencies See telemetry face sheet for immediately available ER MD    Location [facility]-Cardiac & Pulmonary Rehab    Staff Present Birdie Sons, MPA, RN;Joseph Lou Miner, MS, ASCM CEP, Exercise Physiologist    Virtual Visit No    Medication changes reported     No    Fall or balance concerns reported    No    Warm-up and Cool-down Performed on first and last piece of equipment    Resistance Training Performed Yes    VAD Patient? No    PAD/SET Patient? No      Pain Assessment   Currently in Pain? No/denies                Social History   Tobacco Use  Smoking Status Former   Packs/day: 1.00   Years: 30.00   Pack years: 30.00   Types: Cigarettes   Quit date: 08/22/2020   Years since quitting: 1.0  Smokeless Tobacco Never    Goals Met:  Independence with exercise equipment Exercise tolerated well No report of concerns or symptoms today Strength training completed today  Goals Unmet:  Not Applicable  Comments: Pt able to follow exercise prescription today without complaint.  Will continue to monitor for progression.    Dr. Emily Filbert is Medical Director for [place].  Dr. Ottie Glazier is Medical Director for [facility] Pulmonary Rehabilitation.

## 2021-09-06 ENCOUNTER — Encounter: Payer: Medicare HMO | Admitting: *Deleted

## 2021-09-06 ENCOUNTER — Other Ambulatory Visit: Payer: Self-pay

## 2021-09-06 DIAGNOSIS — J8410 Pulmonary fibrosis, unspecified: Secondary | ICD-10-CM | POA: Diagnosis not present

## 2021-09-06 LAB — GLUCOSE, CAPILLARY
Glucose-Capillary: 153 mg/dL — ABNORMAL HIGH (ref 70–99)
Glucose-Capillary: 124 mg/dL — ABNORMAL HIGH (ref 70–99)

## 2021-09-06 NOTE — Progress Notes (Signed)
Daily Session Note  Patient Details  Name: Kirk Robinson MRN: [id number] Date of Birth: Nov 26, 1955 Referring Provider:   Flowsheet Row Pulmonary Rehab from 08/12/2021 in [facility] Cardiac and Pulmonary Rehab  Referring Provider Raul Del       Encounter Date: 09/06/2021  Check In:  Session Check In - 09/06/21 1357       Check-In   Supervising physician immediately available to respond to emergencies See telemetry face sheet for immediately available ER MD    Location [facility]-Cardiac & Pulmonary Rehab    Staff Present Nyoka Cowden, RN, BSN, Tyna Jaksch, MS, ASCM CEP, Exercise Physiologist;Joseph Tessie Fass, [place]    Virtual Visit No    Medication changes reported     No    Fall or balance concerns reported    No    Tobacco Cessation No Change    Warm-up and Cool-down Performed on first and last piece of equipment    Resistance Training Performed Yes    PAD/SET Patient? No      Pain Assessment   Currently in Pain? No/denies                Social History   Tobacco Use  Smoking Status Former   Packs/day: 1.00   Years: 30.00   Pack years: 30.00   Types: Cigarettes   Quit date: 08/22/2020   Years since quitting: 1.0  Smokeless Tobacco Never    Goals Met:  Independence with exercise equipment Exercise tolerated well No report of concerns or symptoms today  Goals Unmet:  Not Applicable  Comments: Pt able to follow exercise prescription today without complaint.  Will continue to monitor for progression.    Dr. Emily Filbert is Medical Director for [place].  Dr. Ottie Glazier is Medical Director for [facility] Pulmonary Rehabilitation.

## 2021-09-08 ENCOUNTER — Other Ambulatory Visit: Payer: Self-pay

## 2021-09-08 DIAGNOSIS — J8410 Pulmonary fibrosis, unspecified: Secondary | ICD-10-CM

## 2021-09-08 NOTE — Progress Notes (Signed)
Daily Session Note  Patient Details  Name: Kirk Robinson MRN: [id number] Date of Birth: 1956-04-03 Referring Provider:   Flowsheet Row Pulmonary Rehab from 08/12/2021 in [facility] Cardiac and Pulmonary Rehab  Referring Provider Raul Del       Encounter Date: 09/08/2021  Check In:  Session Check In - 09/08/21 1329       Check-In   Supervising physician immediately available to respond to emergencies See telemetry face sheet for immediately available ER MD    Location [facility]-Cardiac & Pulmonary Rehab    Staff Present Birdie Sons, MPA, RN;Joseph Garnette Czech [place], MS, ASCM CEP, Exercise Physiologist    Virtual Visit No    Medication changes reported     No    Fall or balance concerns reported    No    Tobacco Cessation No Change    Warm-up and Cool-down Performed on first and last piece of equipment    Resistance Training Performed Yes    VAD Patient? No    PAD/SET Patient? No      Pain Assessment   Currently in Pain? No/denies                Social History   Tobacco Use  Smoking Status Former   Packs/day: 1.00   Years: 30.00   Pack years: 30.00   Types: Cigarettes   Quit date: 08/22/2020   Years since quitting: 1.0  Smokeless Tobacco Never    Goals Met:  Independence with exercise equipment Exercise tolerated well No report of concerns or symptoms today Strength training completed today  Goals Unmet:  Not Applicable  Comments: Pt able to follow exercise prescription today without complaint.  Will continue to monitor for progression.    Dr. Emily Filbert is Medical Director for [place].  Dr. Ottie Glazier is Medical Director for [facility] Pulmonary Rehabilitation.

## 2021-09-13 ENCOUNTER — Other Ambulatory Visit: Payer: Self-pay

## 2021-09-13 DIAGNOSIS — J8410 Pulmonary fibrosis, unspecified: Secondary | ICD-10-CM

## 2021-09-13 NOTE — Progress Notes (Signed)
Daily Session Note  Patient Details  Name: Kirk Robinson MRN: [id number] Date of Birth: 1955-09-03 Referring Provider:   Flowsheet Row Pulmonary Rehab from 08/12/2021 in [facility] Cardiac and Pulmonary Rehab  Referring Provider Raul Del       Encounter Date: 09/13/2021  Check In:  Session Check In - 09/13/21 1337       Check-In   Supervising physician immediately available to respond to emergencies See telemetry face sheet for immediately available ER MD    Location [facility]-Cardiac & Pulmonary Rehab    Staff Present Birdie Sons, MPA, Nino Glow, MS, ASCM CEP, Exercise Physiologist;Joseph Tessie Fass, [place]    Virtual Visit No    Medication changes reported     No    Fall or balance concerns reported    No    Tobacco Cessation No Change    Warm-up and Cool-down Performed on first and last piece of equipment    Resistance Training Performed Yes    VAD Patient? No    PAD/SET Patient? No      Pain Assessment   Currently in Pain? No/denies                Social History   Tobacco Use  Smoking Status Former   Packs/day: 1.00   Years: 30.00   Pack years: 30.00   Types: Cigarettes   Quit date: 08/22/2020   Years since quitting: 1.0  Smokeless Tobacco Never    Goals Met:  Independence with exercise equipment Exercise tolerated well No report of concerns or symptoms today Strength training completed today  Goals Unmet:  Not Applicable  Comments: Pt able to follow exercise prescription today without complaint.  Will continue to monitor for progression.    Dr. Emily Filbert is Medical Director for [place].  Dr. Ottie Glazier is Medical Director for [facility] Pulmonary Rehabilitation.

## 2021-09-15 ENCOUNTER — Encounter: Payer: Self-pay | Admitting: *Deleted

## 2021-09-15 ENCOUNTER — Other Ambulatory Visit: Payer: Self-pay

## 2021-09-15 DIAGNOSIS — J8410 Pulmonary fibrosis, unspecified: Secondary | ICD-10-CM

## 2021-09-15 NOTE — Progress Notes (Signed)
Pulmonary Individual Treatment Plan  Patient Details  Name: Kirk Robinson MRN: [id number] Date of Birth: 1956/07/02 Referring Provider:   Flowsheet Row Pulmonary Rehab from 08/12/2021 in [facility] Cardiac and Pulmonary Rehab  Referring Provider Raul Del       Initial Encounter Date:  Flowsheet Row Pulmonary Rehab from 08/12/2021 in [facility] Cardiac and Pulmonary Rehab  Date 08/12/21       Visit Diagnosis: Pulmonary fibrosis ([place])  Patient's Home Medications on Admission:  Current Outpatient Medications:    Accu-Chek FastClix Lancets MISC, Apply topically daily., Disp: , Rfl:    albuterol (VENTOLIN HFA) 108 (90 Base) MCG/ACT inhaler, Inhale 2 puffs into the lungs every 6 (six) hours as needed for wheezing or shortness of breath., Disp: 18 g, Rfl: 0   alendronate (FOSAMAX) 70 MG tablet, Take by mouth. (Patient not taking: Reported on 06/11/2020), Disp: , Rfl:    ARIPiprazole (ABILIFY) 2 MG tablet, Take 2 mg by mouth daily., Disp: , Rfl:    ARMOUR THYROID 120 MG tablet, Take 120 mg by mouth every morning., Disp: , Rfl:    ASPIRIN LOW DOSE 81 MG EC tablet, TAKE 1 TABLET BY MOUTH EVERY DAY, Disp: 90 tablet, Rfl: 4   atorvastatin (LIPITOR) 20 MG tablet, Take 1 tablet (20 mg total) by mouth daily at 6 PM., Disp: 90 tablet, Rfl: 3   Blood Glucose Monitoring Suppl (ACCU-CHEK GUIDE) w/Device KIT, See admin instructions., Disp: , Rfl:    BREZTRI AEROSPHERE 160-9-4.8 MCG/ACT AERO, SMARTSIG:2 Inhalation Via Inhaler Twice Daily, Disp: , Rfl:    clobetasol ointment (TEMOVATE) 0.05 %, Apply topically 2 (two) times daily. (Patient not taking: Reported on 07/08/2021), Disp: , Rfl:    clopidogrel (PLAVIX) 75 MG tablet, TAKE 1 TABLET BY MOUTH EVERY DAY, Disp: 90 tablet, Rfl: 3   diazepam (VALIUM) 5 MG tablet, Take 5 mg by mouth 2 (two) times daily. , Disp: , Rfl:    diazepam (VALIUM) 5 MG tablet, Take by mouth. (Patient not taking: Reported on 07/08/2021), Disp: , Rfl:    diltiazem (CARDIZEM CD) 120 MG 24 hr  capsule, Take by mouth daily., Disp: , Rfl:    Docusate Sodium 100 MG capsule, Take 1 tablet by mouth in the morning, at noon, and at bedtime. (Patient not taking: Reported on 07/08/2021), Disp: , Rfl:    Ensure Max Protein (ENSURE MAX PROTEIN) LIQD, Take 330 mLs (11 oz total) by mouth 2 (two) times daily. (Patient not taking: Reported on 06/11/2020), Disp: 330 mL, Rfl: 0   ESBRIET 267 MG TABS, Take 3 tablets by mouth 3 (three) times daily as needed., Disp: , Rfl:    famotidine (PEPCID) 20 MG tablet, Take by mouth., Disp: , Rfl:    glucose blood test strip, See admin instructions., Disp: , Rfl:    hydrocortisone 2.5 % ointment, Apply topically 2 (two) times daily., Disp: , Rfl:    linagliptin (TRADJENTA) 5 MG TABS tablet, Take 1 tablet (5 mg total) by mouth daily. (Patient not taking: Reported on 07/08/2021), Disp: 30 tablet, Rfl: 0   metFORMIN (GLUCOPHAGE) 1000 MG tablet, TAKE 1 TABLET BY MOUTH 2 TIMES DAILY WITH MEAL. (Patient taking differently: Take 1,000 mg by mouth 2 (two) times daily with a meal.), Disp: 180 tablet, Rfl: 0   metoprolol succinate (TOPROL-XL) 25 MG 24 hr tablet, Take by mouth., Disp: , Rfl:    metoprolol succinate (TOPROL-XL) 25 MG 24 hr tablet, Take 1 tablet by mouth daily., Disp: , Rfl:    mirtazapine (REMERON) 15 MG  tablet, Take by mouth., Disp: , Rfl:    mirtazapine (REMERON) 30 MG tablet, Take 30 mg by mouth at bedtime., Disp: , Rfl:    morphine (MS CONTIN) 30 MG 12 hr tablet, SMARTSIG:1 Tablet(s) By Mouth Every 12 Hours, Disp: , Rfl:    naloxone (NARCAN) nasal spray 4 mg/0.1 mL, Place into the nose., Disp: , Rfl:    pantoprazole (PROTONIX) 40 MG tablet, Take by mouth., Disp: , Rfl:    Pirfenidone 267 MG TABS, Take by mouth., Disp: , Rfl:    polyethylene glycol (MIRALAX / GLYCOLAX) 17 g packet, Take 17 g by mouth daily., Disp: 30 each, Rfl: 0   polyethylene glycol-electrolytes (NULYTELY) 420 g solution, Prepare according to package instructions. Starting at 5:00 PM: Drink  one 8 oz glass of mixture every 15 minutes until you finish half of the jug. Five hours prior to procedure, drink 8 oz glass of mixture every 15 minutes until it is all gone. Make sure you do not drink anything 4 hours prior to your procedure. (Patient not taking: Reported on 07/08/2021), Disp: 8000 mL, Rfl: 0   predniSONE (DELTASONE) 10 MG tablet, Take 20 mg by mouth daily with breakfast., Disp: , Rfl:    predniSONE (DELTASONE) 10 MG tablet, Take 5 mg by mouth every evening.  (Patient not taking: Reported on 07/08/2021), Disp: , Rfl:    predniSONE (DELTASONE) 10 MG tablet, Take 1 tablet by mouth daily. (Patient not taking: Reported on 07/08/2021), Disp: , Rfl:    rOPINIRole (REQUIP) 0.5 MG tablet, Take by mouth., Disp: , Rfl:    sitaGLIPtin (JANUVIA) 100 MG tablet, Take by mouth., Disp: , Rfl:    tapentadol HCl (NUCYNTA) 75 MG tablet, Take 75 mg by mouth every 4 (four) hours as needed for moderate pain or severe pain.  (Patient not taking: Reported on 06/11/2020), Disp: , Rfl:    thiamine 100 MG tablet, Take 1 tablet (100 mg total) by mouth daily. (Patient not taking: Reported on 06/11/2020), Disp: 30 tablet, Rfl: 0   venlafaxine XR (EFFEXOR-XR) 150 MG 24 hr capsule, Take 1 capsule (150 mg total) by mouth daily with breakfast., Disp: 90 capsule, Rfl: 1   Vitamin D, Ergocalciferol, (DRISDOL) 1.25 MG (50000 UNIT) CAPS capsule, Take by mouth., Disp: , Rfl:   Past Medical History: Past Medical History:  Diagnosis Date   Alcohol use 03/16/2007   patient risk factors   Allergic rhinitis, cause unspecified    Altered mental status    Anxiety state, unspecified    Cervicalgia 09/17/2007   COPD (chronic obstructive pulmonary disease) ([place])    Diabetes mellitus without complication ([place])    Diplopia 12/21/2007   Esophageal reflux    Gastritis 05/13/2009   HCAP (healthcare-associated pneumonia) 08/12/2014   Hematuria 03/16/2007   Hyperthyroidism    graves disease   Iodine hypothyroidism    Memory  loss    NSIP (nonspecific interstitial pneumonia) ([place])    on azathioprine   Osteoporosis, unspecified    bone density per Morivati in 2012   Other abnormal glucose 04/08/2008   Other diseases of lung, not elsewhere classified 01/23/2009   s/p Fleming/pulmonology consult; intolerant  to Advair   Peripheral vascular disease, unspecified ([place]) 12/24/2009   Personal history of colonic polyps    Pneumonia, organism unspecified([place])    Pure hypercholesterolemia    Shortness of breath    Stroke ([place]) 2005   Tobacco use disorder 03/16/2007   patient risk factors   Unspecified hypothyroidism 01/07/2010  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    Tobacco Use: Social History   Tobacco Use  Smoking Status Former   Packs/day: 1.00   Years: 30.00   Pack years: 30.00   Types: Cigarettes   Quit date: 08/22/2020   Years since quitting: 1.0  Smokeless Tobacco Never    Labs: Recent Review Flowsheet Data     Labs for ITP Cardiac and Pulmonary Rehab Latest Ref Rng & Units 07/12/2016 03/01/2017 10/18/2017 01/24/2018 05/11/2019   Cholestrol 100 - 199 mg/dL 167 159 170 191 -   LDLCALC 0 - 99 mg/dL 91 85 72 87 -   HDL >39 mg/dL 53 49 56 47 -   Trlycerides 0 - 149 mg/dL 117 123 211(H) 283(H) -   Hemoglobin A1c 4.8 - 5.6 % 6.2(H) 6.0 6.6(H) 6.5(H) 13.2(H)   PHART 7.350 - 7.450 - - - - -   PCO2ART 35.0 - 45.0 mmHg - - - - -   HCO3 20.0 - 28.0 mmol/L - - - - 38.6(H)   TCO2 0 - 100 mmol/L - - - - -   O2SAT % - - - - 96.8        Pulmonary Assessment Scores:  Pulmonary Assessment Scores     Row Name 08/12/21 1536         ADL UCSD   SOB Score total 91     Rest 1     Walk 3     Stairs 4     Bath 4     Dress 4     Shop 5       CAT Score   CAT Score 20       mMRC Score   mMRC Score 2              UCSD: Self-administered rating of dyspnea associated with activities of daily living (ADLs) 6-point scale (0 = "not at all" to 5 = "maximal or unable to do  because of breathlessness")  Scoring Scores range from 0 to 120.  Minimally important difference is 5 units  CAT: CAT can identify the health impairment of COPD patients and is better correlated with disease progression.  CAT has a scoring range of zero to 40. The CAT score is classified into four groups of low (less than 10), medium (10 - 20), high (21-30) and very high (31-40) based on the impact level of disease on health status. A CAT score over 10 suggests significant symptoms.  A worsening CAT score could be explained by an exacerbation, poor medication adherence, poor inhaler technique, or progression of COPD or comorbid conditions.  CAT MCID is 2 points  mMRC: mMRC (Modified Medical Research Council) Dyspnea Scale is used to assess the degree of baseline functional disability in patients of respiratory disease due to dyspnea. No minimal important difference is established. A decrease in score of 1 point or greater is considered a positive change.   Pulmonary Function Assessment:  Pulmonary Function Assessment - 07/08/21 1048       Breath   Shortness of Breath Yes;Limiting activity             Exercise Target Goals: Exercise Program Goal: Individual exercise prescription set using results from initial 6 min walk test and THRR while considering  patients activity barriers and safety.   Exercise Prescription Goal: Initial exercise prescription builds to 30-45 minutes a day of aerobic activity, 2-3 days per week.  Home exercise guidelines will be given to patient during  program as part of exercise prescription that the participant will acknowledge.  Education: Aerobic Exercise: - Group verbal and visual presentation on the components of exercise prescription. Introduces F.I.T.T principle from ACSM for exercise prescriptions.  Reviews F.I.T.T. principles of aerobic exercise including progression. Written material given at graduation.   Education: Resistance Exercise: - Group  verbal and visual presentation on the components of exercise prescription. Introduces F.I.T.T principle from ACSM for exercise prescriptions  Reviews F.I.T.T. principles of resistance exercise including progression. Written material given at graduation.    Education: Exercise & Equipment Safety: - Individual verbal instruction and demonstration of equipment use and safety with use of the equipment. Flowsheet Row Pulmonary Rehab from 08/12/2021 in [facility] Cardiac and Pulmonary Rehab  Date 08/12/21  Educator AS  Instruction Review Code 1- Verbalizes Understanding       Education: Exercise Physiology & General Exercise Guidelines: - Group verbal and written instruction with models to review the exercise physiology of the cardiovascular system and associated critical values. Provides general exercise guidelines with specific guidelines to those with heart or lung disease.  Flowsheet Row Pulmonary Rehab from 08/12/2021 in [facility] Cardiac and Pulmonary Rehab  Education need identified 08/12/21       Education: Flexibility, Balance, Mind/Body Relaxation: - Group verbal and visual presentation with interactive activity on the components of exercise prescription. Introduces F.I.T.T principle from ACSM for exercise prescriptions. Reviews F.I.T.T. principles of flexibility and balance exercise training including progression. Also discusses the mind body connection.  Reviews various relaxation techniques to help reduce and manage stress (i.e. Deep breathing, progressive muscle relaxation, and visualization). Balance handout provided to take home. Written material given at graduation.   Activity Barriers & Risk Stratification:   6 Minute Walk:  6 Minute Walk     Row Name 08/12/21 1521         6 Minute Walk   Phase Initial     Distance 500 feet     Walk Time 4.5 minutes     # of Rest Breaks 1     MPH 1.3     METS 2.2     RPE 15     Perceived Dyspnea  2     VO2 Peak 7.7     Symptoms No      Resting HR 86 bpm     Resting BP 118/62     Resting Oxygen Saturation  96 %     Exercise Oxygen Saturation  during 6 min walk 84 %     Max Ex. HR 118 bpm     Max Ex. BP 130/64     2 Minute Post BP 110/62       Interval HR   1 Minute HR 106     2 Minute HR 114     3 Minute HR 115     4 Minute HR 118     5 Minute HR 101     6 Minute HR 103     Interval Heart Rate? Yes       Interval Oxygen   Interval Oxygen? Yes     Baseline Oxygen Saturation % 96 %     1 Minute Oxygen Saturation % 89 %     1 Minute Liters of Oxygen 3 L  pulsed     2 Minute Oxygen Saturation % 88 %     2 Minute Liters of Oxygen 3 L  pulsed     3 Minute Oxygen Saturation % 86 %  3 Minute Liters of Oxygen 3 L  pulsed     4 Minute Oxygen Saturation % 84 %  stopped to rest - fatigue     4 Minute Liters of Oxygen 3 L  pulsed     5 Minute Oxygen Saturation % 85 %     5 Minute Liters of Oxygen 3 L  pulsed     6 Minute Oxygen Saturation % 90 %     6 Minute Liters of Oxygen 3 L  pulsed     2 Minute Post Oxygen Saturation % 91 %     2 Minute Post Liters of Oxygen 3 L  pulsed             Oxygen Initial Assessment:  Oxygen Initial Assessment - 07/08/21 1046       Home Oxygen   Home Oxygen Device Portable Concentrator;Home Concentrator;E-Tanks    Sleep Oxygen Prescription Continuous    Liters per minute 3.5    Home Exercise Oxygen Prescription Continuous    Liters per minute 3.5    Home Resting Oxygen Prescription Continuous    Liters per minute 3.5    Compliance with Home Oxygen Use Yes      Initial 6 min Walk   Oxygen Used Pulsed;Portable Concentrator    Liters per minute 3      Program Oxygen Prescription   Program Oxygen Prescription Continuous    Liters per minute 3      Intervention   Short Term Goals To learn and exhibit compliance with exercise, home and travel O2 prescription;To learn and understand importance of monitoring SPO2 with pulse oximeter and demonstrate accurate use of the pulse  oximeter.;To learn and understand importance of maintaining oxygen saturations>88%;To learn and demonstrate proper use of respiratory medications;To learn and demonstrate proper pursed lip breathing techniques or other breathing techniques.     Long  Term Goals Exhibits compliance with exercise, home  and travel O2 prescription;Verbalizes importance of monitoring SPO2 with pulse oximeter and return demonstration;Maintenance of O2 saturations>88%;Exhibits proper breathing techniques, such as pursed lip breathing or other method taught during program session;Compliance with respiratory medication;Demonstrates proper use of MDIs             Oxygen Re-Evaluation:  Oxygen Re-Evaluation     Row Name 08/30/21 1337             Program Oxygen Prescription   Program Oxygen Prescription Continuous       Liters per minute 3         Home Oxygen   Home Oxygen Device Portable Concentrator;Home Concentrator;E-Tanks       Sleep Oxygen Prescription Continuous       Liters per minute 3.5       Home Exercise Oxygen Prescription Continuous       Liters per minute 3.5       Home Resting Oxygen Prescription Continuous       Liters per minute 3.5       Compliance with Home Oxygen Use Yes         Goals/Expected Outcomes   Short Term Goals To learn and exhibit compliance with exercise, home and travel O2 prescription;To learn and understand importance of monitoring SPO2 with pulse oximeter and demonstrate accurate use of the pulse oximeter.;To learn and understand importance of maintaining oxygen saturations>88%;To learn and demonstrate proper pursed lip breathing techniques or other breathing techniques.        Long  Term Goals Exhibits compliance with exercise, home  and travel O2 prescription;Verbalizes importance of monitoring SPO2 with pulse oximeter and return demonstration;Maintenance of O2 saturations>88%;Exhibits proper breathing techniques, such as pursed lip breathing or other method taught  during program session;Compliance with respiratory medication       Comments Reviewed PLB technique with pt.  Talked about how it works and it's importance in maintaining their exercise saturations.       Goals/Expected Outcomes Short: Become more profiecient at using PLB.   Long: Become independent at using PLB.                Oxygen Discharge (Final Oxygen Re-Evaluation):  Oxygen Re-Evaluation - 08/30/21 1337       Program Oxygen Prescription   Program Oxygen Prescription Continuous    Liters per minute 3      Home Oxygen   Home Oxygen Device Portable Concentrator;Home Concentrator;E-Tanks    Sleep Oxygen Prescription Continuous    Liters per minute 3.5    Home Exercise Oxygen Prescription Continuous    Liters per minute 3.5    Home Resting Oxygen Prescription Continuous    Liters per minute 3.5    Compliance with Home Oxygen Use Yes      Goals/Expected Outcomes   Short Term Goals To learn and exhibit compliance with exercise, home and travel O2 prescription;To learn and understand importance of monitoring SPO2 with pulse oximeter and demonstrate accurate use of the pulse oximeter.;To learn and understand importance of maintaining oxygen saturations>88%;To learn and demonstrate proper pursed lip breathing techniques or other breathing techniques.     Long  Term Goals Exhibits compliance with exercise, home  and travel O2 prescription;Verbalizes importance of monitoring SPO2 with pulse oximeter and return demonstration;Maintenance of O2 saturations>88%;Exhibits proper breathing techniques, such as pursed lip breathing or other method taught during program session;Compliance with respiratory medication    Comments Reviewed PLB technique with pt.  Talked about how it works and it's importance in maintaining their exercise saturations.    Goals/Expected Outcomes Short: Become more profiecient at using PLB.   Long: Become independent at using PLB.             Initial Exercise  Prescription:  Initial Exercise Prescription - 08/12/21 1500       Date of Initial Exercise RX and Referring Provider   Date 08/12/21    Referring Provider Meredeth Ide      Oxygen   Oxygen Continuous    Liters 3    Maintain Oxygen Saturation 88% or higher      Treadmill   MPH 1    Grade 0.5    Minutes 15    METs 1.8      Recumbant Bike   Level 1    RPM 60    Minutes 15    METs 2      NuStep   Level 1    SPM 80    Minutes 15    METs 2      Arm Ergometer   Level 1    RPM 25    Minutes 15    METs 2      Prescription Details   Frequency (times per week) 2    Duration Progress to 30 minutes of continuous aerobic without signs/symptoms of physical distress      Intensity   THRR 40-80% of Max Heartrate 114-143    Ratings of Perceived Exertion 11-15    Perceived Dyspnea 0-4      Resistance Training   Training Prescription Yes  Weight 3 lb    Reps 10-15             Perform Capillary Blood Glucose checks as needed.  Exercise Prescription Changes:   Exercise Prescription Changes     Row Name 08/12/21 1500 09/06/21 1300           Response to Exercise   Blood Pressure (Admit) 118/62 108/68      Blood Pressure (Exercise) 130/64 128/64      Blood Pressure (Exit) 110/62 112/64      Heart Rate (Admit) 86 bpm 87 bpm      Heart Rate (Exercise) 118 bpm 105 bpm      Heart Rate (Exit) 106 bpm 88 bpm      Oxygen Saturation (Admit) 96 % 99 %      Oxygen Saturation (Exercise) 84 % 93 %      Oxygen Saturation (Exit) 91 % 95 %      Rating of Perceived Exertion (Exercise) 15 13      Perceived Dyspnea (Exercise) 2 1      Symptoms none none      Comments -- second full day of exercise      Duration -- Progress to 30 minutes of  aerobic without signs/symptoms of physical distress      Intensity -- THRR unchanged        Progression   Progression -- Continue to progress workloads to maintain intensity without signs/symptoms of physical distress.      Average METs  -- 2.03        Resistance Training   Training Prescription -- Yes      Weight -- 3 lb      Reps -- 10-15        Interval Training   Interval Training -- No        Oxygen   Oxygen -- Continuous      Liters -- 3        Treadmill   MPH -- 0.6      Grade -- 0      Minutes -- 15      METs -- 1.46        Recumbant Bike   Level -- 2      Minutes -- 15      METs -- 2.9        NuStep   Level -- 2      Minutes -- 15      METs -- 1.7        Oxygen   Maintain Oxygen Saturation -- 88% or higher               Exercise Comments:   Exercise Comments     Row Name 08/30/21 1336           Exercise Comments First full day of exercise!  Patient was oriented to gym and equipment including functions, settings, policies, and procedures.  Patient's individual exercise prescription and treatment plan were reviewed.  All starting workloads were established based on the results of the 6 minute walk test done at initial orientation visit.  The plan for exercise progression was also introduced and progression will be customized based on patient's performance and goals.                Exercise Goals and Review:   Exercise Goals     Row Name 08/12/21 1530             Exercise Goals  Increase Physical Activity Yes       Intervention Provide advice, education, support and counseling about physical activity/exercise needs.;Develop an individualized exercise prescription for aerobic and resistive training based on initial evaluation findings, risk stratification, comorbidities and participant's personal goals.       Expected Outcomes Short Term: Attend rehab on a regular basis to increase amount of physical activity.;Long Term: Add in home exercise to make exercise part of routine and to increase amount of physical activity.;Long Term: Exercising regularly at least 3-5 days a week.       Increase Strength and Stamina Yes       Intervention Provide advice, education, support and  counseling about physical activity/exercise needs.       Expected Outcomes Short Term: Increase workloads from initial exercise prescription for resistance, speed, and METs.;Short Term: Perform resistance training exercises routinely during rehab and add in resistance training at home;Long Term: Improve cardiorespiratory fitness, muscular endurance and strength as measured by increased METs and functional capacity (6MWT)       Able to understand and use rate of perceived exertion (RPE) scale Yes       Intervention Provide education and explanation on how to use RPE scale       Expected Outcomes Short Term: Able to use RPE daily in rehab to express subjective intensity level;Long Term:  Able to use RPE to guide intensity level when exercising independently       Able to understand and use Dyspnea scale Yes       Intervention Provide education and explanation on how to use Dyspnea scale       Expected Outcomes Short Term: Able to use Dyspnea scale daily in rehab to express subjective sense of shortness of breath during exertion;Long Term: Able to use Dyspnea scale to guide intensity level when exercising independently       Knowledge and understanding of Target Heart Rate Range (THRR) Yes       Intervention Provide education and explanation of THRR including how the numbers were predicted and where they are located for reference       Expected Outcomes Short Term: Able to state/look up THRR;Short Term: Able to use daily as guideline for intensity in rehab;Long Term: Able to use THRR to govern intensity when exercising independently       Able to check pulse independently Yes       Intervention Provide education and demonstration on how to check pulse in carotid and radial arteries.;Review the importance of being able to check your own pulse for safety during independent exercise       Expected Outcomes Short Term: Able to explain why pulse checking is important during independent exercise;Long Term: Able  to check pulse independently and accurately       Understanding of Exercise Prescription Yes       Intervention Provide education, explanation, and written materials on patient's individual exercise prescription       Expected Outcomes Short Term: Able to explain program exercise prescription;Long Term: Able to explain home exercise prescription to exercise independently                Exercise Goals Re-Evaluation :  Exercise Goals Re-Evaluation     Row Name 08/30/21 1336 09/06/21 1329           Exercise Goal Re-Evaluation   Exercise Goals Review Increase Physical Activity;Able to understand and use rate of perceived exertion (RPE) scale;Knowledge and understanding of Target Heart Rate Range (THRR);Understanding of  Exercise Prescription;Increase Strength and Stamina;Able to understand and use Dyspnea scale;Able to check pulse independently Increase Physical Activity;Increase Strength and Stamina;Understanding of Exercise Prescription      Comments Reviewed RPE and dyspnea scales, THR and program prescription with pt today.  Pt voiced understanding and was given a copy of goals to take home. Louie Casa is off to a good start in rehab.  He has completed his first two full days of rehab.  We will continue to monitor his progress.      Expected Outcomes Short: Use RPE daily to regulate intensity. Long: Follow program prescription in [facility]. Short: Attend rehab regularly Long: Continue to follow program prescription               Discharge Exercise Prescription (Final Exercise Prescription Changes):  Exercise Prescription Changes - 09/06/21 1300       Response to Exercise   Blood Pressure (Admit) 108/68    Blood Pressure (Exercise) 128/64    Blood Pressure (Exit) 112/64    Heart Rate (Admit) 87 bpm    Heart Rate (Exercise) 105 bpm    Heart Rate (Exit) 88 bpm    Oxygen Saturation (Admit) 99 %    Oxygen Saturation (Exercise) 93 %    Oxygen Saturation (Exit) 95 %    Rating of Perceived  Exertion (Exercise) 13    Perceived Dyspnea (Exercise) 1    Symptoms none    Comments second full day of exercise    Duration Progress to 30 minutes of  aerobic without signs/symptoms of physical distress    Intensity THRR unchanged      Progression   Progression Continue to progress workloads to maintain intensity without signs/symptoms of physical distress.    Average METs 2.03      Resistance Training   Training Prescription Yes    Weight 3 lb    Reps 10-15      Interval Training   Interval Training No      Oxygen   Oxygen Continuous    Liters 3      Treadmill   MPH 0.6    Grade 0    Minutes 15    METs 1.46      Recumbant Bike   Level 2    Minutes 15    METs 2.9      NuStep   Level 2    Minutes 15    METs 1.7      Oxygen   Maintain Oxygen Saturation 88% or higher             Nutrition:  Target Goals: Understanding of nutrition guidelines, daily intake of sodium '1500mg'$ , cholesterol '200mg'$ , calories 30% from fat and 7% or less from saturated fats, daily to have 5 or more servings of fruits and vegetables.  Education: All About Nutrition: -Group instruction provided by verbal, written material, interactive activities, discussions, models, and posters to present general guidelines for heart healthy nutrition including fat, fiber, MyPlate, the role of sodium in heart healthy nutrition, utilization of the nutrition label, and utilization of this knowledge for meal planning. Follow up email sent as well. Written material given at graduation.   Biometrics:  Pre Biometrics - 08/12/21 1531       Pre Biometrics   Height $Remov'5\' 9"'ybgOUB$  (0.076 m)    Weight 172 lb 11.2 oz (78.3 kg)    BMI (Calculated) 25.49    Single Leg Stand 1.78 seconds  Nutrition Therapy Plan and Nutrition Goals:   Nutrition Assessments:  MEDIFICTS Score Key: ?70 Need to make dietary changes  40-70 Heart Healthy Diet ? 40 Therapeutic Level Cholesterol Diet  Flowsheet Row  Pulmonary Rehab from 08/12/2021 in [facility] Cardiac and Pulmonary Rehab  Picture Your Plate Total Score on Admission 71      Picture Your Plate Scores: <09 Unhealthy dietary pattern with much room for improvement. 41-50 Dietary pattern unlikely to meet recommendations for good health and room for improvement. 51-60 More healthful dietary pattern, with some room for improvement.  >60 Healthy dietary pattern, although there may be some specific behaviors that could be improved.   Nutrition Goals Re-Evaluation:  Nutrition Goals Re-Evaluation     [place] Name 09/08/21 1342             Goals   Current Weight 171 lb (77.6 kg)       Nutrition Goal Eat more protien. Eat smaller meals.       Comment Patient has had a stroke and he still feels like things taste and smell bad from it.Patient was informed on why it is important to maintain a balanced diet when dealing with Respiratory issues. Explained that it takes a lot of energy to breath and when they are short of breath often they will need to have a good diet to help keep up with the calories they are expending for breathing.       Expected Outcome Short: eat more protien. Long: maintain protien and blood glucose.                Nutrition Goals Discharge (Final Nutrition Goals Re-Evaluation):  Nutrition Goals Re-Evaluation - 09/08/21 1342       Goals   Current Weight 171 lb (77.6 kg)    Nutrition Goal Eat more protien. Eat smaller meals.    Comment Patient has had a stroke and he still feels like things taste and smell bad from it.Patient was informed on why it is important to maintain a balanced diet when dealing with Respiratory issues. Explained that it takes a lot of energy to breath and when they are short of breath often they will need to have a good diet to help keep up with the calories they are expending for breathing.    Expected Outcome Short: eat more protien. Long: maintain protien and blood glucose.              Psychosocial: Target Goals: Acknowledge presence or absence of significant depression and/or stress, maximize coping skills, provide positive support system. Participant is able to verbalize types and ability to use techniques and skills needed for reducing stress and depression.   Education: Stress, Anxiety, and Depression - Group verbal and visual presentation to define topics covered.  Reviews how body is impacted by stress, anxiety, and depression.  Also discusses healthy ways to reduce stress and to treat/manage anxiety and depression.  Written material given at graduation.   Education: Sleep Hygiene -Provides group verbal and written instruction about how sleep can affect your health.  Define sleep hygiene, discuss sleep cycles and impact of sleep habits. Review good sleep hygiene tips.    Initial Review & Psychosocial Screening:  Initial Psych Review & Screening - 07/08/21 1049       Initial Review   Current issues with Current Psychotropic Meds;History of Depression;Current Depression;Current Stress Concerns    Source of Stress Concerns Chronic Illness    Comments He has anxiety and nothing in particular sets  him off. His depression gets to him sometimes and will come and go.      Family Dynamics   Good Support System? Yes    Comments He can look to his wife for support. He can call his children if he needed help.      Barriers   Psychosocial barriers to participate in program There are no identifiable barriers or psychosocial needs.;The patient should benefit from training in stress management and relaxation.      Screening Interventions   Interventions Encouraged to exercise;To provide support and resources with identified psychosocial needs;Provide feedback about the scores to participant    Expected Outcomes Long Term Goal: Stressors or current issues are controlled or eliminated.;Short Term goal: Utilizing psychosocial counselor, staff and physician to assist with  identification of specific Stressors or current issues interfering with healing process. Setting desired goal for each stressor or current issue identified.;Short Term goal: Identification and review with participant of any Quality of Life or Depression concerns found by scoring the questionnaire.;Long Term goal: The participant improves quality of Life and PHQ9 Scores as seen by post scores and/or verbalization of changes             Quality of Life Scores:  Scores of 19 and below usually indicate a poorer quality of life in these areas.  A difference of  2-3 points is a clinically meaningful difference.  A difference of 2-3 points in the total score of the Quality of Life Index has been associated with significant improvement in overall quality of life, self-image, physical symptoms, and general health in studies assessing change in quality of life.  PHQ-9: Recent Review Flowsheet Data     Depression screen [name] 2/9 09/08/2021 08/12/2021 01/24/2018 10/18/2017 03/01/2017   Decreased Interest 3 3 0 1 1   Down, Depressed, Hopeless 1 2 0 1 1   PHQ - 2 Score 4 5 0 2 2   Altered sleeping 3 3  - 1 1   Tired, decreased energy 3 3 - 1 2   Change in appetite 3 2  - 1 0   Feeling bad or failure about yourself  0 2 - 1 1   Trouble concentrating 3 2 - 0 0   Moving slowly or fidgety/restless 1 1 - 0 -   Suicidal thoughts 0 0 - 0 0   PHQ-9 Score 17 18 - 6 6   Difficult doing work/chores Somewhat difficult Very difficult - - -      Interpretation of Total Score  Total Score Depression Severity:  1-4 = Minimal depression, 5-9 = Mild depression, 10-14 = Moderate depression, 15-19 = Moderately severe depression, 20-27 = Severe depression   Psychosocial Evaluation and Intervention:  Psychosocial Evaluation - 07/08/21 1051       Psychosocial Evaluation & Interventions   Interventions Encouraged to exercise with the program and follow exercise prescription;Stress management education;Relaxation  education    Comments He has anxiety and nothing in particular sets him off. His depression gets to him sometimes and will come and go.He can look to his wife for support. He can call his children if he needed help.    Expected Outcomes Short: Start [facility] to help with mood. Long: Maintain a healthy mental state.    Continue Psychosocial Services  Follow up required by staff             Psychosocial Re-Evaluation:  Psychosocial Re-Evaluation     [place] Name 09/08/21 1348  Psychosocial Re-Evaluation   Current issues with Current Psychotropic Meds;Current Depression;History of Depression;Current Anxiety/Panic       Comments Reviewed patient health questionnaire (PHQ-9) with patient for follow up. Previously, patients score indicated signs/symptoms of depression.  Reviewed to see if patient is improving symptom wise while in program.  Score improved and patient states that it is becuase he is able to come and workout. He has alot to improve upon and has a positive attitude about it.       Expected Outcomes Short: Continue to attend [facility] regularly for regular exercise and social engagement. Long: Continue to improve symptoms and manage a positive mental state.       Interventions Encouraged to attend Pulmonary Rehabilitation for the exercise       Continue Psychosocial Services  Follow up required by staff                Psychosocial Discharge (Final Psychosocial Re-Evaluation):  Psychosocial Re-Evaluation - 09/08/21 1348       Psychosocial Re-Evaluation   Current issues with Current Psychotropic Meds;Current Depression;History of Depression;Current Anxiety/Panic    Comments Reviewed patient health questionnaire (PHQ-9) with patient for follow up. Previously, patients score indicated signs/symptoms of depression.  Reviewed to see if patient is improving symptom wise while in program.  Score improved and patient states that it is becuase he is able to come and  workout. He has alot to improve upon and has a positive attitude about it.    Expected Outcomes Short: Continue to attend [facility] regularly for regular exercise and social engagement. Long: Continue to improve symptoms and manage a positive mental state.    Interventions Encouraged to attend Pulmonary Rehabilitation for the exercise    Continue Psychosocial Services  Follow up required by staff             Education: Education Goals: Education classes will be provided on a weekly basis, covering required topics. Participant will state understanding/return demonstration of topics presented.  Learning Barriers/Preferences:  Learning Barriers/Preferences - 07/08/21 1048       Learning Barriers/Preferences   Learning Barriers None    Learning Preferences None             General Pulmonary Education Topics:  Infection Prevention: - Provides verbal and written material to individual with discussion of infection control including proper hand washing and proper equipment cleaning during exercise session. Flowsheet Row Pulmonary Rehab from 08/12/2021 in [facility] Cardiac and Pulmonary Rehab  Date 08/12/21  Educator AS  Instruction Review Code 1- Verbalizes Understanding       Falls Prevention: - Provides verbal and written material to individual with discussion of falls prevention and safety. Flowsheet Row Pulmonary Rehab from 08/12/2021 in [facility] Cardiac and Pulmonary Rehab  Date 08/12/21  Educator AS  Instruction Review Code 1- Verbalizes Understanding       Chronic Lung Disease Review: - Group verbal instruction with posters, models, PowerPoint presentations and videos,  to review new updates, new respiratory medications, new advancements in procedures and treatments. Providing information on websites and "800" numbers for continued self-education. Includes information about supplement oxygen, available portable oxygen systems, continuous and intermittent flow rates, oxygen  safety, concentrators, and Medicare reimbursement for oxygen. Explanation of Pulmonary Drugs, including class, frequency, complications, importance of spacers, rinsing mouth after steroid MDI's, and proper cleaning methods for nebulizers. Review of basic lung anatomy and physiology related to function, structure, and complications of lung disease. Review of risk factors. Discussion about methods for diagnosing sleep  apnea and types of masks and machines for OSA. Includes a review of the use of types of environmental controls: home humidity, furnaces, filters, dust mite/pet prevention, HEPA vacuums. Discussion about weather changes, air quality and the benefits of nasal washing. Instruction on Warning signs, infection symptoms, calling MD promptly, preventive modes, and value of vaccinations. Review of effective airway clearance, coughing and/or vibration techniques. Emphasizing that all should Create an Action Plan. Written material given at graduation. Flowsheet Row Pulmonary Rehab from 08/12/2021 in [facility] Cardiac and Pulmonary Rehab  Date 08/12/21       AED/CPR: - Group verbal and written instruction with the use of models to demonstrate the basic use of the AED with the basic ABC's of resuscitation.    Anatomy and Cardiac Procedures: - Group verbal and visual presentation and models provide information about basic cardiac anatomy and function. Reviews the testing methods done to diagnose heart disease and the outcomes of the test results. Describes the treatment choices: Medical Management, Angioplasty, or Coronary Bypass Surgery for treating various heart conditions including Myocardial Infarction, Angina, Valve Disease, and Cardiac Arrhythmias.  Written material given at graduation.   Medication Safety: - Group verbal and visual instruction to review commonly prescribed medications for heart and lung disease. Reviews the medication, class of the drug, and side effects. Includes the steps to  properly store meds and maintain the prescription regimen.  Written material given at graduation.   Other: -Provides group and verbal instruction on various topics (see comments)   Knowledge Questionnaire Score:  Knowledge Questionnaire Score - 08/12/21 1532       Knowledge Questionnaire Score   Pre Score 15/18              Core Components/Risk Factors/Patient Goals at Admission:  Personal Goals and Risk Factors at Admission - 08/12/21 1538       Core Components/Risk Factors/Patient Goals on Admission    Weight Management Yes;Weight Loss    Intervention Weight Management: Develop a combined nutrition and exercise program designed to reach desired caloric intake, while maintaining appropriate intake of nutrient and fiber, sodium and fats, and appropriate energy expenditure required for the weight goal.;Weight Management: Provide education and appropriate resources to help participant work on and attain dietary goals.;Weight Management/Obesity: Establish reasonable short term and long term weight goals.    Admit Weight 172 lb 11.2 oz (78.3 kg)    Goal Weight: Short Term 175 lb (79.4 kg)    Goal Weight: Long Term 170 lb (77.1 kg)    Expected Outcomes Short Term: Continue to assess and modify interventions until short term weight is achieved;Long Term: Adherence to nutrition and physical activity/exercise program aimed toward attainment of established weight goal;Weight Loss: Understanding of general recommendations for a balanced deficit meal plan, which promotes 1-2 lb weight loss per week and includes a negative energy balance of [id number] kcal/d;Understanding recommendations for meals to include 15-35% energy as protein, 25-35% energy from fat, 35-60% energy from carbohydrates, less than $RemoveB'200mg'cPyBHJiw$  of dietary cholesterol, 20-35 gm of total fiber daily;Understanding of distribution of calorie intake throughout the day with the consumption of 4-5 meals/snacks    Improve shortness of breath  with ADL's Yes    Intervention Provide education, individualized exercise plan and daily activity instruction to help decrease symptoms of SOB with activities of daily living.    Expected Outcomes Short Term: Improve cardiorespiratory fitness to achieve a reduction of symptoms when performing ADLs;Long Term: Be able to perform more ADLs without symptoms or delay the  onset of symptoms    Diabetes Yes    Intervention Provide education about signs/symptoms and action to take for hypo/hyperglycemia.;Provide education about proper nutrition, including hydration, and aerobic/resistive exercise prescription along with prescribed medications to achieve blood glucose in normal ranges: Fasting glucose 65-99 mg/dL    Expected Outcomes Short Term: Participant verbalizes understanding of the signs/symptoms and immediate care of hyper/hypoglycemia, proper foot care and importance of medication, aerobic/resistive exercise and nutrition plan for blood glucose control.;Long Term: Attainment of HbA1C < 7%.    Hypertension Yes    Intervention Provide education on lifestyle modifcations including regular physical activity/exercise, weight management, moderate sodium restriction and increased consumption of fresh fruit, vegetables, and low fat dairy, alcohol moderation, and smoking cessation.;Monitor prescription use compliance.    Expected Outcomes Short Term: Continued assessment and intervention until BP is < 140/34mm HG in hypertensive participants. < 130/40mm HG in hypertensive participants with diabetes, heart failure or chronic kidney disease.;Long Term: Maintenance of blood pressure at goal levels.    Lipids Yes    Intervention Provide education and support for participant on nutrition & aerobic/resistive exercise along with prescribed medications to achieve LDL '70mg'$ , HDL >$Remo'40mg'YzzUc$ .    Expected Outcomes Short Term: Participant states understanding of desired cholesterol values and is compliant with medications  prescribed. Participant is following exercise prescription and nutrition guidelines.;Long Term: Cholesterol controlled with medications as prescribed, with individualized exercise RX and with personalized nutrition plan. Value goals: LDL < $Rem'70mg'GaUr$ , HDL > 40 mg.             Education:Diabetes - Individual verbal and written instruction to review signs/symptoms of diabetes, desired ranges of glucose level fasting, after meals and with exercise. Acknowledge that pre and post exercise glucose checks will be done for 3 sessions at entry of program. Flowsheet Row Pulmonary Rehab from 07/08/2021 in [facility] Cardiac and Pulmonary Rehab  Date 07/08/21  Educator [facility]  Instruction Review Code 1- Verbalizes Understanding       Know Your Numbers and Heart Failure: - Group verbal and visual instruction to discuss disease risk factors for cardiac and pulmonary disease and treatment options.  Reviews associated critical values for Overweight/Obesity, Hypertension, Cholesterol, and Diabetes.  Discusses basics of heart failure: signs/symptoms and treatments.  Introduces Heart Failure Zone chart for action plan for heart failure.  Written material given at graduation.   Core Components/Risk Factors/Patient Goals Review:   Goals and Risk Factor Review     Row Name 09/08/21 1341             Core Components/Risk Factors/Patient Goals Review   Personal Goals Review Improve shortness of breath with ADL's       Review Spoke to patient about their shortness of breath and what they can do to improve. Patient has been informed of breathing techniques when starting the program. Patient is informed to tell staff if they have had any med changes and that certain meds they are taking or not taking can be causing shortness of breath.       Expected Outcomes Short: Attend [facility] regularly to improve shortness of breath with ADLs. Long: maintain independence with ADLs                Core Components/Risk  Factors/Patient Goals at Discharge (Final Review):   Goals and Risk Factor Review - 09/08/21 1341       Core Components/Risk Factors/Patient Goals Review   Personal Goals Review Improve shortness of breath with ADL's    Review Spoke to  patient about their shortness of breath and what they can do to improve. Patient has been informed of breathing techniques when starting the program. Patient is informed to tell staff if they have had any med changes and that certain meds they are taking or not taking can be causing shortness of breath.    Expected Outcomes Short: Attend [facility] regularly to improve shortness of breath with ADLs. Long: maintain independence with ADLs             ITP Comments:  ITP Comments     Row Name 07/08/21 1045 08/12/21 1542 08/18/21 0833 08/30/21 1336 09/15/21 0916   ITP Comments Virtual Visit completed. Patient informed on EP and RD appointment and 6 Minute walk test. Patient also informed of patient health questionnaires on My Chart. Patient Verbalizes understanding. Visit diagnosis can be found in [facility] 05/10/2021. Completed 6MWT and gym orientation. Initial ITP created and sent for review to Dr. Ottie Glazier, Medical Director. 30 Day review completed. Medical Director ITP review done, changes made as directed, and signed approval by Medical Director. First full day of exercise!  Patient was oriented to gym and equipment including functions, settings, policies, and procedures.  Patient's individual exercise prescription and treatment plan were reviewed.  All starting workloads were established based on the results of the 6 minute walk test done at initial orientation visit.  The plan for exercise progression was also introduced and progression will be customized based on patient's performance and goals. 30 Day review completed. Medical Director ITP review done, changes made as directed, and signed approval by Medical Director.   new to program             Comments:

## 2021-09-15 NOTE — Progress Notes (Signed)
Daily Session Note  Patient Details  Name: Kirk Robinson MRN: [id number] Date of Birth: 07-31-1956 Referring Provider:   Flowsheet Row Pulmonary Rehab from 08/12/2021 in [facility] Cardiac and Pulmonary Rehab  Referring Provider Raul Del       Encounter Date: 09/15/2021  Check In:  Session Check In - 09/15/21 1327       Check-In   Supervising physician immediately available to respond to emergencies See telemetry face sheet for immediately available ER MD    Location [facility]-Cardiac & Pulmonary Rehab    Staff Present Birdie Sons, MPA, RN;Joseph Alcus Dad, RN BSN    Virtual Visit No    Medication changes reported     No    Fall or balance concerns reported    No    Tobacco Cessation No Change    Warm-up and Cool-down Performed on first and last piece of equipment    Resistance Training Performed Yes    VAD Patient? No    PAD/SET Patient? No      Pain Assessment   Currently in Pain? No/denies                Social History   Tobacco Use  Smoking Status Former   Packs/day: 1.00   Years: 30.00   Pack years: 30.00   Types: Cigarettes   Quit date: 08/22/2020   Years since quitting: 1.0  Smokeless Tobacco Never    Goals Met:  Independence with exercise equipment Exercise tolerated well No report of concerns or symptoms today Strength training completed today  Goals Unmet:  Not Applicable  Comments: Pt able to follow exercise prescription today without complaint.  Will continue to monitor for progression.    Dr. Emily Filbert is Medical Director for [facility].  Dr. Ottie Glazier is Medical Director for [facility] Pulmonary Rehabilitation.

## 2021-09-20 ENCOUNTER — Other Ambulatory Visit: Payer: Self-pay

## 2021-09-20 DIAGNOSIS — J8410 Pulmonary fibrosis, unspecified: Secondary | ICD-10-CM | POA: Diagnosis not present

## 2021-09-20 NOTE — Progress Notes (Signed)
Daily Session Note  Patient Details  Name: Matix Henshaw MRN: [id number] Date of Birth: 06/02/1956 Referring Provider:   Flowsheet Row Pulmonary Rehab from 08/12/2021 in [facility] Card[facility]  Referring Provider Raul Del       Encounter Date: 09/20/2021  Check In:  Session Check In - 09/20/21 1335       Check-In   Supervising physician immediately available to respond to emergencies See telemetry face sheet for immediately available ER MD    Location [facility]-Cardiac & Pulmonary Rehab    Staff Present Birdie Sons, MPA, RN;Joseph Garnette Czech [place], MS, ASCM CEP, Exercise Physiologist    Virtual Visit No    Medication changes reported     No    Fall or balance concerns reported    No    Tobacco Cessation No Change    Warm-up and Cool-down Performed on first and last piece of equipment    Resistance Training Performed Yes    VAD Patient? No    PAD/SET Patient? No      Pain Assessment   Currently in Pain? No/denies                Social History   Tobacco Use  Smoking Status Former   Packs/day: 1.00   Years: 30.00   Pack years: 30.00   Types: Cigarettes   Quit date: 08/22/2020   Years since quitting: 1.0  Smokeless Tobacco Never    Goals Met:  Independence with exercise equipment Exercise tolerated well No report of concerns or symptoms today Strength training completed today  Goals Unmet:  Not Applicable  Comments: Pt able to follow exercise prescription today without complaint.  Will continue to monitor for progression.    Dr. Emily Filbert is Medical Director for [facility].  Dr. Ottie Glazier is Medical Director for [facility] Pulmonary Rehabilitation.

## 2021-09-22 ENCOUNTER — Other Ambulatory Visit: Payer: Self-pay

## 2021-09-22 ENCOUNTER — Encounter: Payer: Medicare HMO | Attending: Specialist

## 2021-09-22 DIAGNOSIS — J8410 Pulmonary fibrosis, unspecified: Secondary | ICD-10-CM | POA: Insufficient documentation

## 2021-09-22 DIAGNOSIS — J439 Emphysema, unspecified: Secondary | ICD-10-CM | POA: Diagnosis not present

## 2021-09-22 NOTE — Progress Notes (Signed)
Daily Session Note ° °Patient Details  °Name: Osualdo Dimmer °MRN: [id number] °Date of Birth: 06/28/1956 °Referring Provider:   °Flowsheet Row Pulmonary Rehab from 08/12/2021 in [facility] Cardiac and Pulmonary Rehab  °Referring Provider Fleming  ° °  ° ° °Encounter Date: 09/22/2021 ° °Check In: ° Session Check In - 09/22/21 1340   ° °  ° Check-In  ° Supervising physician immediately available to respond to emergencies See telemetry face sheet for immediately available ER MD   ° Location [facility]   ° Staff Present  , MPA, RN;Kara Langdon, MS, ASCM CEP, Exercise Physiologist;Joseph Hood, RCP,RRT,BSRT   ° Virtual Visit No   ° Medication changes reported     No   ° Fall or balance concerns reported    No   ° Tobacco Cessation No Change   ° Warm-up and Cool-down Performed on first and last piece of equipment   ° Resistance Training Performed Yes   ° VAD Patient? No   ° PAD/SET Patient? No   °  ° Pain Assessment  ° Currently in Pain? No/denies   ° °  °  ° °  ° ° ° ° ° °Social History  ° °Tobacco Use  °Smoking Status Former  ° Packs/day: 1.00  ° Years: 30.00  ° Pack years: 30.00  ° Types: Cigarettes  ° Quit date: 08/22/2020  ° Years since quitting: 1.0  °Smokeless Tobacco Never  ° ° °Goals Met:  °Independence with exercise equipment °Exercise tolerated well °No report of concerns or symptoms today °Strength training completed today ° °Goals Unmet:  °Not Applicable ° °Comments: Pt able to follow exercise prescription today without complaint.  Will continue to monitor for progression. ° ° ° °Dr. Mark Miller is Medical Director for [facility].  °Dr. Fuad Aleskerov is Medical Director for [facility]. °

## 2021-09-27 ENCOUNTER — Other Ambulatory Visit: Payer: Self-pay

## 2021-09-27 DIAGNOSIS — J8410 Pulmonary fibrosis, unspecified: Secondary | ICD-10-CM | POA: Diagnosis not present

## 2021-09-27 NOTE — Progress Notes (Signed)
Daily Session Note  Patient Details  Name: Kirk Robinson MRN: [id number] Date of Birth: 15-Feb-1956 Referring Provider:   Flowsheet Row Pulmonary Rehab from 08/12/2021 in [facility]  Referring Provider Raul Del       Encounter Date: 09/27/2021  Check In:  Session Check In - 09/27/21 1333       Check-In   Supervising physician immediately available to respond to emergencies See telemetry face sheet for immediately available ER MD    Location [facility]    Staff Present Birdie Sons, MPA, Nino Glow, MS, ASCM CEP, Exercise Physiologist;Joseph Tessie Fass, [place]    Virtual Visit No    Medication changes reported     No    Fall or balance concerns reported    No    Tobacco Cessation No Change    Warm-up and Cool-down Performed on first and last piece of equipment    Resistance Training Performed Yes    VAD Patient? No    PAD/SET Patient? No      Pain Assessment   Currently in Pain? No/denies                Social History   Tobacco Use  Smoking Status Former   Packs/day: 1.00   Years: 30.00   Pack years: 30.00   Types: Cigarettes   Quit date: 08/22/2020   Years since quitting: 1.0  Smokeless Tobacco Never    Goals Met:  Independence with exercise equipment Exercise tolerated well No report of concerns or symptoms today Strength training completed today  Goals Unmet:  Not Applicable  Comments: Pt able to follow exercise prescription today without complaint.  Will continue to monitor for progression.    Dr. Emily Filbert is Medical Director for [place].  Dr. Ottie Glazier is Medical Director for [facility] Rehabilitation.

## 2021-09-29 ENCOUNTER — Other Ambulatory Visit: Payer: Self-pay

## 2021-09-29 DIAGNOSIS — J8410 Pulmonary fibrosis, unspecified: Secondary | ICD-10-CM

## 2021-09-29 NOTE — Progress Notes (Signed)
Daily Session Note  Patient Details  Name: Kirk Robinson MRN: [id number] Date of Birth: 1956/05/04 Referring Provider:   Flowsheet Row Pulmonary Rehab from 08/12/2021 in [facility] Cardiac and Pulmonary Rehab  Referring Provider Raul Del       Encounter Date: 09/29/2021  Check In:  Session Check In - 09/29/21 1338       Check-In   Supervising physician immediately available to respond to emergencies See telemetry face sheet for immediately available ER MD    Location [facility]    Staff Present Birdie Sons, MPA, RN;Melissa [place], RDN, Tawanna Solo, MS, ASCM CEP, Exercise Physiologist    Virtual Visit No    Medication changes reported     No    Fall or balance concerns reported    No    Tobacco Cessation No Change    Warm-up and Cool-down Performed on first and last piece of equipment    Resistance Training Performed No   has to leave before weights   VAD Patient? No    PAD/SET Patient? No      Pain Assessment   Currently in Pain? No/denies                Social History   Tobacco Use  Smoking Status Former   Packs/day: 1.00   Years: 30.00   Pack years: 30.00   Types: Cigarettes   Quit date: 08/22/2020   Years since quitting: 1.1  Smokeless Tobacco Never    Goals Met:  Independence with exercise equipment Exercise tolerated well No report of concerns or symptoms today Strength training completed today  Goals Unmet:  Not Applicable  Comments: Pt able to follow exercise prescription today without complaint.  Will continue to monitor for progression.    Dr. Emily Filbert is Medical Director for [place].  Dr. Ottie Glazier is Medical Director for [facility] Pulmonary Rehabilitation.

## 2021-10-04 ENCOUNTER — Other Ambulatory Visit: Payer: Self-pay

## 2021-10-04 DIAGNOSIS — J8410 Pulmonary fibrosis, unspecified: Secondary | ICD-10-CM

## 2021-10-04 NOTE — Progress Notes (Signed)
Completed initial RD consultation ?

## 2021-10-04 NOTE — Progress Notes (Signed)
Daily Session Note ° °Patient Details  °Name: Kirk Robinson °MRN: [id number] °Date of Birth: 09/15/1955 °Referring Provider:   °Flowsheet Row Pulmonary Rehab from 08/12/2021 in [facility]  °Referring Provider Fleming  ° °  ° ° °Encounter Date: 10/04/2021 ° °Check In: ° Session Check In - 10/04/21 1335   ° °  ° Check-In  ° Supervising physician immediately available to respond to emergencies See telemetry face sheet for immediately available ER MD   ° Location [facility]   ° Staff Present  , MPA, RN;Jessica Hawkins, MA, RCEP, CCRP, CCET;Kara Langdon, MS, ASCM CEP, Exercise Physiologist;Joseph Hood, RCP,RRT,BSRT   ° Virtual Visit No   ° Medication changes reported     No   ° Fall or balance concerns reported    No   ° Tobacco Cessation No Change   ° Warm-up and Cool-down Performed on first and last piece of equipment   ° Resistance Training Performed Yes   ° VAD Patient? No   ° PAD/SET Patient? No   °  ° Pain Assessment  ° Currently in Pain? No/denies   ° °  °  ° °  ° ° ° ° ° °Social History  ° °Tobacco Use  °Smoking Status Former  ° Packs/day: 1.00  ° Years: 30.00  ° Pack years: 30.00  ° Types: Cigarettes  ° Quit date: 08/22/2020  ° Years since quitting: 1.1  °Smokeless Tobacco Never  ° ° °Goals Met:  °Independence with exercise equipment °Exercise tolerated well °No report of concerns or symptoms today °Strength training completed today ° °Goals Unmet:  °Not Applicable ° °Comments: Pt able to follow exercise prescription today without complaint.  Will continue to monitor for progression. ° ° ° °Dr. Mark Miller is Medical Director for [facility].  °Dr. Fuad Aleskerov is Medical Director for [facility]. °

## 2021-10-11 ENCOUNTER — Encounter: Payer: Medicare HMO | Admitting: *Deleted

## 2021-10-11 ENCOUNTER — Other Ambulatory Visit: Payer: Self-pay

## 2021-10-11 DIAGNOSIS — J8410 Pulmonary fibrosis, unspecified: Secondary | ICD-10-CM | POA: Diagnosis not present

## 2021-10-11 NOTE — Progress Notes (Signed)
Daily Session Note  Patient Details  Name: Kirk Robinson MRN: [id number] Date of Birth: 1956-04-13 Referring Provider:   Flowsheet Row Pulmonary Rehab from 08/12/2021 in [facility] Cardiac and Pulmonary Rehab  Referring Provider Raul Del       Encounter Date: 10/11/2021  Check In:  Session Check In - 10/11/21 1409       Check-In   Supervising physician immediately available to respond to emergencies See telemetry face sheet for immediately available ER MD    Location [facility]    Staff Present Nyoka Cowden, RN, BSN, Lauretta Grill, RCP,RRT,BSRT;Kara [employer], MS, ASCM CEP, Exercise Physiologist    Virtual Visit No    Medication changes reported     No    Fall or balance concerns reported    No    Tobacco Cessation No Change    Warm-up and Cool-down Performed on first and last piece of equipment    Resistance Training Performed Yes    VAD Patient? No    PAD/SET Patient? No      Pain Assessment   Currently in Pain? No/denies                Social History   Tobacco Use  Smoking Status Former   Packs/day: 1.00   Years: 30.00   Pack years: 30.00   Types: Cigarettes   Quit date: 08/22/2020   Years since quitting: 1.1  Smokeless Tobacco Never    Goals Met:  Independence with exercise equipment Exercise tolerated well Strength training completed today  Goals Unmet:  Not Applicable  Comments: Pt able to follow exercise prescription today without complaint.  Will continue to monitor for progression.    Dr. Emily Filbert is Medical Director for [facility].  Dr. Ottie Glazier is Medical Director for [facility] Pulmonary Rehabilitation.

## 2021-10-12 ENCOUNTER — Ambulatory Visit (INDEPENDENT_AMBULATORY_CARE_PROVIDER_SITE_OTHER): Payer: Medicare HMO | Admitting: Vascular Surgery

## 2021-10-13 ENCOUNTER — Other Ambulatory Visit: Payer: Self-pay

## 2021-10-13 ENCOUNTER — Encounter: Payer: Self-pay | Admitting: *Deleted

## 2021-10-13 DIAGNOSIS — J8410 Pulmonary fibrosis, unspecified: Secondary | ICD-10-CM

## 2021-10-13 NOTE — Progress Notes (Signed)
Pulmonary Individual Treatment Plan  Patient Details  Name: Kirk Robinson MRN: [id number] Date of Birth: 1956/07/02 Referring Provider:   Flowsheet Row Pulmonary Rehab from 08/12/2021 in [facility] Cardiac and Pulmonary Rehab  Referring Provider Raul Del       Initial Encounter Date:  Flowsheet Row Pulmonary Rehab from 08/12/2021 in [facility] Cardiac and Pulmonary Rehab  Date 08/12/21       Visit Diagnosis: Pulmonary fibrosis ([place])  Patient's Home Medications on Admission:  Current Outpatient Medications:    Accu-Chek FastClix Lancets MISC, Apply topically daily., Disp: , Rfl:    albuterol (VENTOLIN HFA) 108 (90 Base) MCG/ACT inhaler, Inhale 2 puffs into the lungs every 6 (six) hours as needed for wheezing or shortness of breath., Disp: 18 g, Rfl: 0   alendronate (FOSAMAX) 70 MG tablet, Take by mouth. (Patient not taking: Reported on 06/11/2020), Disp: , Rfl:    ARIPiprazole (ABILIFY) 2 MG tablet, Take 2 mg by mouth daily., Disp: , Rfl:    ARMOUR THYROID 120 MG tablet, Take 120 mg by mouth every morning., Disp: , Rfl:    ASPIRIN LOW DOSE 81 MG EC tablet, TAKE 1 TABLET BY MOUTH EVERY DAY, Disp: 90 tablet, Rfl: 4   atorvastatin (LIPITOR) 20 MG tablet, Take 1 tablet (20 mg total) by mouth daily at 6 PM., Disp: 90 tablet, Rfl: 3   Blood Glucose Monitoring Suppl (ACCU-CHEK GUIDE) w/Device KIT, See admin instructions., Disp: , Rfl:    BREZTRI AEROSPHERE 160-9-4.8 MCG/ACT AERO, SMARTSIG:2 Inhalation Via Inhaler Twice Daily, Disp: , Rfl:    clobetasol ointment (TEMOVATE) 0.05 %, Apply topically 2 (two) times daily. (Patient not taking: Reported on 07/08/2021), Disp: , Rfl:    clopidogrel (PLAVIX) 75 MG tablet, TAKE 1 TABLET BY MOUTH EVERY DAY, Disp: 90 tablet, Rfl: 3   diazepam (VALIUM) 5 MG tablet, Take 5 mg by mouth 2 (two) times daily. , Disp: , Rfl:    diazepam (VALIUM) 5 MG tablet, Take by mouth. (Patient not taking: Reported on 07/08/2021), Disp: , Rfl:    diltiazem (CARDIZEM CD) 120 MG 24 hr  capsule, Take by mouth daily., Disp: , Rfl:    Docusate Sodium 100 MG capsule, Take 1 tablet by mouth in the morning, at noon, and at bedtime. (Patient not taking: Reported on 07/08/2021), Disp: , Rfl:    Ensure Max Protein (ENSURE MAX PROTEIN) LIQD, Take 330 mLs (11 oz total) by mouth 2 (two) times daily. (Patient not taking: Reported on 06/11/2020), Disp: 330 mL, Rfl: 0   ESBRIET 267 MG TABS, Take 3 tablets by mouth 3 (three) times daily as needed., Disp: , Rfl:    famotidine (PEPCID) 20 MG tablet, Take by mouth., Disp: , Rfl:    glucose blood test strip, See admin instructions., Disp: , Rfl:    hydrocortisone 2.5 % ointment, Apply topically 2 (two) times daily., Disp: , Rfl:    linagliptin (TRADJENTA) 5 MG TABS tablet, Take 1 tablet (5 mg total) by mouth daily. (Patient not taking: Reported on 07/08/2021), Disp: 30 tablet, Rfl: 0   metFORMIN (GLUCOPHAGE) 1000 MG tablet, TAKE 1 TABLET BY MOUTH 2 TIMES DAILY WITH MEAL. (Patient taking differently: Take 1,000 mg by mouth 2 (two) times daily with a meal.), Disp: 180 tablet, Rfl: 0   metoprolol succinate (TOPROL-XL) 25 MG 24 hr tablet, Take by mouth., Disp: , Rfl:    metoprolol succinate (TOPROL-XL) 25 MG 24 hr tablet, Take 1 tablet by mouth daily., Disp: , Rfl:    mirtazapine (REMERON) 15 MG  tablet, Take by mouth., Disp: , Rfl:    mirtazapine (REMERON) 30 MG tablet, Take 30 mg by mouth at bedtime., Disp: , Rfl:    morphine (MS CONTIN) 30 MG 12 hr tablet, SMARTSIG:1 Tablet(s) By Mouth Every 12 Hours, Disp: , Rfl:    naloxone (NARCAN) nasal spray 4 mg/0.1 mL, Place into the nose., Disp: , Rfl:    pantoprazole (PROTONIX) 40 MG tablet, Take by mouth., Disp: , Rfl:    Pirfenidone 267 MG TABS, Take by mouth., Disp: , Rfl:    polyethylene glycol (MIRALAX / GLYCOLAX) 17 g packet, Take 17 g by mouth daily., Disp: 30 each, Rfl: 0   polyethylene glycol-electrolytes (NULYTELY) 420 g solution, Prepare according to package instructions. Starting at 5:00 PM: Drink  one 8 oz glass of mixture every 15 minutes until you finish half of the jug. Five hours prior to procedure, drink 8 oz glass of mixture every 15 minutes until it is all gone. Make sure you do not drink anything 4 hours prior to your procedure. (Patient not taking: Reported on 07/08/2021), Disp: 8000 mL, Rfl: 0   predniSONE (DELTASONE) 10 MG tablet, Take 20 mg by mouth daily with breakfast., Disp: , Rfl:    predniSONE (DELTASONE) 10 MG tablet, Take 5 mg by mouth every evening.  (Patient not taking: Reported on 07/08/2021), Disp: , Rfl:    predniSONE (DELTASONE) 10 MG tablet, Take 1 tablet by mouth daily. (Patient not taking: Reported on 07/08/2021), Disp: , Rfl:    rOPINIRole (REQUIP) 0.5 MG tablet, Take by mouth., Disp: , Rfl:    sitaGLIPtin (JANUVIA) 100 MG tablet, Take by mouth., Disp: , Rfl:    tapentadol HCl (NUCYNTA) 75 MG tablet, Take 75 mg by mouth every 4 (four) hours as needed for moderate pain or severe pain.  (Patient not taking: Reported on 06/11/2020), Disp: , Rfl:    thiamine 100 MG tablet, Take 1 tablet (100 mg total) by mouth daily. (Patient not taking: Reported on 06/11/2020), Disp: 30 tablet, Rfl: 0   venlafaxine XR (EFFEXOR-XR) 150 MG 24 hr capsule, Take 1 capsule (150 mg total) by mouth daily with breakfast., Disp: 90 capsule, Rfl: 1   Vitamin D, Ergocalciferol, (DRISDOL) 1.25 MG (50000 UNIT) CAPS capsule, Take by mouth., Disp: , Rfl:   Past Medical History: Past Medical History:  Diagnosis Date   Alcohol use 03/16/2007   patient risk factors   Allergic rhinitis, cause unspecified    Altered mental status    Anxiety state, unspecified    Cervicalgia 09/17/2007   COPD (chronic obstructive pulmonary disease) ([place])    Diabetes mellitus without complication ([place])    Diplopia 12/21/2007   Esophageal reflux    Gastritis 05/13/2009   HCAP (healthcare-associated pneumonia) 08/12/2014   Hematuria 03/16/2007   Hyperthyroidism    graves disease   Iodine hypothyroidism    Memory  loss    NSIP (nonspecific interstitial pneumonia) ([place])    on azathioprine   Osteoporosis, unspecified    bone density per Morivati in 2012   Other abnormal glucose 04/08/2008   Other diseases of lung, not elsewhere classified 01/23/2009   s/p Fleming/pulmonology consult; intolerant  to Advair   Peripheral vascular disease, unspecified ([place]) 12/24/2009   Personal history of colonic polyps    Pneumonia, organism unspecified([place])    Pure hypercholesterolemia    Shortness of breath    Stroke ([place]) 2005   Tobacco use disorder 03/16/2007   patient risk factors   Unspecified hypothyroidism 01/07/2010  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    Tobacco Use: Social History   Tobacco Use  Smoking Status Former   Packs/day: 1.00   Years: 30.00   Pack years: 30.00   Types: Cigarettes   Quit date: 08/22/2020   Years since quitting: 1.1  Smokeless Tobacco Never    Labs: Recent Review Flowsheet Data     Labs for ITP Cardiac and Pulmonary Rehab Latest Ref Rng & Units 07/12/2016 03/01/2017 10/18/2017 01/24/2018 05/11/2019   Cholestrol 100 - 199 mg/dL 167 159 170 191 -   LDLCALC 0 - 99 mg/dL 91 85 72 87 -   HDL >39 mg/dL 53 49 56 47 -   Trlycerides 0 - 149 mg/dL 117 123 211(H) 283(H) -   Hemoglobin A1c 4.8 - 5.6 % 6.2(H) 6.0 6.6(H) 6.5(H) 13.2(H)   PHART 7.350 - 7.450 - - - - -   PCO2ART 35.0 - 45.0 mmHg - - - - -   HCO3 20.0 - 28.0 mmol/L - - - - 38.6(H)   TCO2 0 - 100 mmol/L - - - - -   O2SAT % - - - - 96.8        Pulmonary Assessment Scores:  Pulmonary Assessment Scores     Row Name 08/12/21 1536         ADL UCSD   SOB Score total 91     Rest 1     Walk 3     Stairs 4     Bath 4     Dress 4     Shop 5       CAT Score   CAT Score 20       mMRC Score   mMRC Score 2              UCSD: Self-administered rating of dyspnea associated with activities of daily living (ADLs) 6-point scale (0 = "not at all" to 5 = "maximal or unable to do  because of breathlessness")  Scoring Scores range from 0 to 120.  Minimally important difference is 5 units  CAT: CAT can identify the health impairment of COPD patients and is better correlated with disease progression.  CAT has a scoring range of zero to 40. The CAT score is classified into four groups of low (less than 10), medium (10 - 20), high (21-30) and very high (31-40) based on the impact level of disease on health status. A CAT score over 10 suggests significant symptoms.  A worsening CAT score could be explained by an exacerbation, poor medication adherence, poor inhaler technique, or progression of COPD or comorbid conditions.  CAT MCID is 2 points  mMRC: mMRC (Modified Medical Research Council) Dyspnea Scale is used to assess the degree of baseline functional disability in patients of respiratory disease due to dyspnea. No minimal important difference is established. A decrease in score of 1 point or greater is considered a positive change.   Pulmonary Function Assessment:  Pulmonary Function Assessment - 07/08/21 1048       Breath   Shortness of Breath Yes;Limiting activity             Exercise Target Goals: Exercise Program Goal: Individual exercise prescription set using results from initial 6 min walk test and THRR while considering  patients activity barriers and safety.   Exercise Prescription Goal: Initial exercise prescription builds to 30-45 minutes a day of aerobic activity, 2-3 days per week.  Home exercise guidelines will be given to patient during  program as part of exercise prescription that the participant will acknowledge.  Education: Aerobic Exercise: - Group verbal and visual presentation on the components of exercise prescription. Introduces F.I.T.T principle from ACSM for exercise prescriptions.  Reviews F.I.T.T. principles of aerobic exercise including progression. Written material given at graduation.   Education: Resistance Exercise: - Group  verbal and visual presentation on the components of exercise prescription. Introduces F.I.T.T principle from ACSM for exercise prescriptions  Reviews F.I.T.T. principles of resistance exercise including progression. Written material given at graduation.    Education: Exercise & Equipment Safety: - Individual verbal instruction and demonstration of equipment use and safety with use of the equipment. Flowsheet Row Pulmonary Rehab from 08/12/2021 in [facility] Cardiac and Pulmonary Rehab  Date 08/12/21  Educator AS  Instruction Review Code 1- Verbalizes Understanding       Education: Exercise Physiology & General Exercise Guidelines: - Group verbal and written instruction with models to review the exercise physiology of the cardiovascular system and associated critical values. Provides general exercise guidelines with specific guidelines to those with heart or lung disease.  Flowsheet Row Pulmonary Rehab from 08/12/2021 in [facility] Cardiac and Pulmonary Rehab  Education need identified 08/12/21       Education: Flexibility, Balance, Mind/Body Relaxation: - Group verbal and visual presentation with interactive activity on the components of exercise prescription. Introduces F.I.T.T principle from ACSM for exercise prescriptions. Reviews F.I.T.T. principles of flexibility and balance exercise training including progression. Also discusses the mind body connection.  Reviews various relaxation techniques to help reduce and manage stress (i.e. Deep breathing, progressive muscle relaxation, and visualization). Balance handout provided to take home. Written material given at graduation.   Activity Barriers & Risk Stratification:   6 Minute Walk:  6 Minute Walk     Row Name 08/12/21 1521         6 Minute Walk   Phase Initial     Distance 500 feet     Walk Time 4.5 minutes     # of Rest Breaks 1     MPH 1.3     METS 2.2     RPE 15     Perceived Dyspnea  2     VO2 Peak 7.7     Symptoms No      Resting HR 86 bpm     Resting BP 118/62     Resting Oxygen Saturation  96 %     Exercise Oxygen Saturation  during 6 min walk 84 %     Max Ex. HR 118 bpm     Max Ex. BP 130/64     2 Minute Post BP 110/62       Interval HR   1 Minute HR 106     2 Minute HR 114     3 Minute HR 115     4 Minute HR 118     5 Minute HR 101     6 Minute HR 103     Interval Heart Rate? Yes       Interval Oxygen   Interval Oxygen? Yes     Baseline Oxygen Saturation % 96 %     1 Minute Oxygen Saturation % 89 %     1 Minute Liters of Oxygen 3 L  pulsed     2 Minute Oxygen Saturation % 88 %     2 Minute Liters of Oxygen 3 L  pulsed     3 Minute Oxygen Saturation % 86 %  3 Minute Liters of Oxygen 3 L  pulsed     4 Minute Oxygen Saturation % 84 %  stopped to rest - fatigue     4 Minute Liters of Oxygen 3 L  pulsed     5 Minute Oxygen Saturation % 85 %     5 Minute Liters of Oxygen 3 L  pulsed     6 Minute Oxygen Saturation % 90 %     6 Minute Liters of Oxygen 3 L  pulsed     2 Minute Post Oxygen Saturation % 91 %     2 Minute Post Liters of Oxygen 3 L  pulsed             Oxygen Initial Assessment:  Oxygen Initial Assessment - 07/08/21 1046       Home Oxygen   Home Oxygen Device Portable Concentrator;Home Concentrator;E-Tanks    Sleep Oxygen Prescription Continuous    Liters per minute 3.5    Home Exercise Oxygen Prescription Continuous    Liters per minute 3.5    Home Resting Oxygen Prescription Continuous    Liters per minute 3.5    Compliance with Home Oxygen Use Yes      Initial 6 min Walk   Oxygen Used Pulsed;Portable Concentrator    Liters per minute 3      Program Oxygen Prescription   Program Oxygen Prescription Continuous    Liters per minute 3      Intervention   Short Term Goals To learn and exhibit compliance with exercise, home and travel O2 prescription;To learn and understand importance of monitoring SPO2 with pulse oximeter and demonstrate accurate use of the pulse  oximeter.;To learn and understand importance of maintaining oxygen saturations>88%;To learn and demonstrate proper use of respiratory medications;To learn and demonstrate proper pursed lip breathing techniques or other breathing techniques.     Long  Term Goals Exhibits compliance with exercise, home  and travel O2 prescription;Verbalizes importance of monitoring SPO2 with pulse oximeter and return demonstration;Maintenance of O2 saturations>88%;Exhibits proper breathing techniques, such as pursed lip breathing or other method taught during program session;Compliance with respiratory medication;Demonstrates proper use of MDIs             Oxygen Re-Evaluation:  Oxygen Re-Evaluation     Row Name 08/30/21 1337 09/29/21 1347           Program Oxygen Prescription   Program Oxygen Prescription Continuous Continuous      Liters per minute 3 3        Home Oxygen   Home Oxygen Device Portable Concentrator;Home Concentrator;E-Tanks Portable Concentrator;Home Concentrator;E-Tanks      Sleep Oxygen Prescription Continuous Continuous      Liters per minute 3.5 3.5      Home Exercise Oxygen Prescription Continuous Continuous      Liters per minute 3.5 3.5      Home Resting Oxygen Prescription Continuous Continuous      Liters per minute 3.5 3.5      Compliance with Home Oxygen Use Yes Yes        Goals/Expected Outcomes   Short Term Goals To learn and exhibit compliance with exercise, home and travel O2 prescription;To learn and understand importance of monitoring SPO2 with pulse oximeter and demonstrate accurate use of the pulse oximeter.;To learn and understand importance of maintaining oxygen saturations>88%;To learn and demonstrate proper pursed lip breathing techniques or other breathing techniques.  To learn and exhibit compliance with exercise, home and travel O2 prescription;To learn and  understand importance of monitoring SPO2 with pulse oximeter and demonstrate accurate use of the pulse  oximeter.;To learn and understand importance of maintaining oxygen saturations>88%;To learn and demonstrate proper pursed lip breathing techniques or other breathing techniques.       Long  Term Goals Exhibits compliance with exercise, home  and travel O2 prescription;Verbalizes importance of monitoring SPO2 with pulse oximeter and return demonstration;Maintenance of O2 saturations>88%;Exhibits proper breathing techniques, such as pursed lip breathing or other method taught during program session;Compliance with respiratory medication Exhibits compliance with exercise, home  and travel O2 prescription;Verbalizes importance of monitoring SPO2 with pulse oximeter and return demonstration;Maintenance of O2 saturations>88%;Exhibits proper breathing techniques, such as pursed lip breathing or other method taught during program session;Compliance with respiratory medication      Comments Reviewed PLB technique with pt.  Talked about how it works and it's importance in maintaining their exercise saturations. --      Goals/Expected Outcomes Short: Become more profiecient at using PLB.   Long: Become independent at using PLB. --               Oxygen Discharge (Final Oxygen Re-Evaluation):  Oxygen Re-Evaluation - 09/29/21 1347       Program Oxygen Prescription   Program Oxygen Prescription Continuous    Liters per minute 3      Home Oxygen   Home Oxygen Device Portable Concentrator;Home Concentrator;E-Tanks    Sleep Oxygen Prescription Continuous    Liters per minute 3.5    Home Exercise Oxygen Prescription Continuous    Liters per minute 3.5    Home Resting Oxygen Prescription Continuous    Liters per minute 3.5    Compliance with Home Oxygen Use Yes      Goals/Expected Outcomes   Short Term Goals To learn and exhibit compliance with exercise, home and travel O2 prescription;To learn and understand importance of monitoring SPO2 with pulse oximeter and demonstrate accurate use of the pulse  oximeter.;To learn and understand importance of maintaining oxygen saturations>88%;To learn and demonstrate proper pursed lip breathing techniques or other breathing techniques.     Long  Term Goals Exhibits compliance with exercise, home  and travel O2 prescription;Verbalizes importance of monitoring SPO2 with pulse oximeter and return demonstration;Maintenance of O2 saturations>88%;Exhibits proper breathing techniques, such as pursed lip breathing or other method taught during program session;Compliance with respiratory medication             Initial Exercise Prescription:  Initial Exercise Prescription - 08/12/21 1500       Date of Initial Exercise RX and Referring Provider   Date 08/12/21    Referring Provider Raul Del      Oxygen   Oxygen Continuous    Liters 3    Maintain Oxygen Saturation 88% or higher      Treadmill   MPH 1    Grade 0.5    Minutes 15    METs 1.8      Recumbant Bike   Level 1    RPM 60    Minutes 15    METs 2      NuStep   Level 1    SPM 80    Minutes 15    METs 2      Arm Ergometer   Level 1    RPM 25    Minutes 15    METs 2      Prescription Details   Frequency (times per week) 2    Duration Progress to 30 minutes of  continuous aerobic without signs/symptoms of physical distress      Intensity   THRR 40-80% of Max Heartrate 114-143    Ratings of Perceived Exertion 11-15    Perceived Dyspnea 0-4      Resistance Training   Training Prescription Yes    Weight 3 lb    Reps 10-15             Perform Capillary Blood Glucose checks as needed.  Exercise Prescription Changes:   Exercise Prescription Changes     Row Name 08/12/21 1500 09/06/21 1300 09/20/21 1100 09/29/21 1500 10/04/21 1000     Response to Exercise   Blood Pressure (Admit) 118/62 108/68 122/68 -- 118/64   Blood Pressure (Exercise) 130/64 128/64 128/68 -- --   Blood Pressure (Exit) 110/62 112/64 110/60 -- 142/70   Heart Rate (Admit) 86 bpm 87 bpm 96 bpm -- 83  bpm   Heart Rate (Exercise) 118 bpm 105 bpm 96 bpm -- 111 bpm   Heart Rate (Exit) 106 bpm 88 bpm 91 bpm -- 105 bpm   Oxygen Saturation (Admit) 96 % 99 % 98 % -- 98 %   Oxygen Saturation (Exercise) 84 % 93 % 92 % -- 94 %   Oxygen Saturation (Exit) 91 % 95 % 97 % -- 98 %   Rating of Perceived Exertion (Exercise) _0 -- 12   Perceived Dyspnea (Exercise) _1 -- 3   Symptoms none none SOB -- SOB   Comments -- second full day of exercise -- -- --   Duration -- Progress to 30 minutes of  aerobic without signs/symptoms of physical distress Progress to 30 minutes of  aerobic without signs/symptoms of physical distress -- Progress to 30 minutes of  aerobic without signs/symptoms of physical distress   Intensity -- THRR unchanged THRR unchanged -- THRR unchanged     Progression   Progression -- Continue to progress workloads to maintain intensity without signs/symptoms of physical distress. Continue to progress workloads to maintain intensity without signs/symptoms of physical distress. -- Continue to progress workloads to maintain intensity without signs/symptoms of physical distress.   Average METs -- 2.03 2.35 -- 1.88     Resistance Training   Training Prescription -- Yes Yes -- Yes   Weight -- 3 lb 3 lb -- 3 lb   Reps -- 10-15 10-15 -- 10-15     Interval Training   Interval Training -- No No -- No     Oxygen   Oxygen -- Continuous Continuous -- Continuous   Liters -- 3 3 -- 3     Treadmill   MPH -- 0.6 -- -- 0.5   Grade -- 0 -- -- 0   Minutes -- 15 -- -- 15   METs -- 1.46 -- -- 1.4     Recumbant Bike   Level -- 2 1 -- --   Minutes -- 15 15 -- --   METs -- 2.9 3 -- --     NuStep   Level -- 2 1 -- 2   Minutes -- 15 15 -- 15   METs -- 1.7 1.7 -- 1.8     T5 Nustep   Level -- -- -- -- 3   Minutes -- -- -- -- 15   METs -- -- -- -- 1.9     Biostep-RELP   Level -- -- -- -- 1   Minutes -- -- -- -- 15   METs -- -- -- -- 2  Home Exercise Plan   Plans to continue  exercise at -- -- -- Home (comment)  treadmill & weight machines Home (comment)  treadmill & weight machines   Frequency -- -- -- Add 3 additional days to program exercise sessions.  start with 1 day Add 3 additional days to program exercise sessions.  start with 1 day   Initial Home Exercises Provided -- -- -- 09/29/21 09/29/21     Oxygen   Maintain Oxygen Saturation -- 88% or higher -- 88% or higher 88% or higher            Exercise Comments:   Exercise Comments     Row Name 08/30/21 1336           Exercise Comments First full day of exercise!  Patient was oriented to gym and equipment including functions, settings, policies, and procedures.  Patient's individual exercise prescription and treatment plan were reviewed.  All starting workloads were established based on the results of the 6 minute walk test done at initial orientation visit.  The plan for exercise progression was also introduced and progression will be customized based on patient's performance and goals.                Exercise Goals and Review:   Exercise Goals     Row Name 08/12/21 1530             Exercise Goals   Increase Physical Activity Yes       Intervention Provide advice, education, support and counseling about physical activity/exercise needs.;Develop an individualized exercise prescription for aerobic and resistive training based on initial evaluation findings, risk stratification, comorbidities and participant's personal goals.       Expected Outcomes Short Term: Attend rehab on a regular basis to increase amount of physical activity.;Long Term: Add in home exercise to make exercise part of routine and to increase amount of physical activity.;Long Term: Exercising regularly at least 3-5 days a week.       Increase Strength and Stamina Yes       Intervention Provide advice, education, support and counseling about physical activity/exercise needs.       Expected Outcomes Short Term: Increase  workloads from initial exercise prescription for resistance, speed, and METs.;Short Term: Perform resistance training exercises routinely during rehab and add in resistance training at home;Long Term: Improve cardiorespiratory fitness, muscular endurance and strength as measured by increased METs and functional capacity (6MWT)       Able to understand and use rate of perceived exertion (RPE) scale Yes       Intervention Provide education and explanation on how to use RPE scale       Expected Outcomes Short Term: Able to use RPE daily in rehab to express subjective intensity level;Long Term:  Able to use RPE to guide intensity level when exercising independently       Able to understand and use Dyspnea scale Yes       Intervention Provide education and explanation on how to use Dyspnea scale       Expected Outcomes Short Term: Able to use Dyspnea scale daily in rehab to express subjective sense of shortness of breath during exertion;Long Term: Able to use Dyspnea scale to guide intensity level when exercising independently       Knowledge and understanding of Target Heart Rate Range (THRR) Yes       Intervention Provide education and explanation of THRR including how the numbers were predicted and where they are  located for reference       Expected Outcomes Short Term: Able to state/look up THRR;Short Term: Able to use daily as guideline for intensity in rehab;Long Term: Able to use THRR to govern intensity when exercising independently       Able to check pulse independently Yes       Intervention Provide education and demonstration on how to check pulse in carotid and radial arteries.;Review the importance of being able to check your own pulse for safety during independent exercise       Expected Outcomes Short Term: Able to explain why pulse checking is important during independent exercise;Long Term: Able to check pulse independently and accurately       Understanding of Exercise Prescription Yes        Intervention Provide education, explanation, and written materials on patient's individual exercise prescription       Expected Outcomes Short Term: Able to explain program exercise prescription;Long Term: Able to explain home exercise prescription to exercise independently                Exercise Goals Re-Evaluation :  Exercise Goals Re-Evaluation     [place] Name 08/30/21 1336 09/06/21 1329 09/20/21 1127 09/20/21 1129 09/29/21 1515     Exercise Goal Re-Evaluation   Exercise Goals Review Increase Physical Activity;Able to understand and use rate of perceived exertion (RPE) scale;Knowledge and understanding of Target Heart Rate Range (THRR);Understanding of Exercise Prescription;Increase Strength and Stamina;Able to understand and use Dyspnea scale;Able to check pulse independently Increase Physical Activity;Increase Strength and Stamina;Understanding of Exercise Prescription Increase Physical Activity;Increase Strength and Stamina -- Increase Physical Activity;Increase Strength and Stamina;Understanding of Exercise Prescription   Comments Reviewed RPE and dyspnea scales, THR and program prescription with pt today.  Pt voiced understanding and was given a copy of goals to take home. Louie Casa is off to a good start in rehab.  He has completed his first two full days of rehab.  We will continue to monitor his progress. Louie Casa attends consistently.  Oxygen has stayed in 90s during all sessions. He has reached THR range during a couple sessions.  Staff will monitor progress. Reviewed home exercise with pt today.  Pt plans to walk and do weight machines for exercise. He also has  a treadmill to walk on at home. We reviewed the [facility] and he is thinking of joining.  Reviewed THR, pulse, RPE, sign and symptoms, pulse oximetery and when to call 911 or MD.  Also discussed weather considerations and indoor options.  Pt voiced understanding.   Expected Outcomes Short: Use RPE daily to regulate intensity. Long:  Follow program prescription in THR. Short: Attend rehab regularly Long: Continue to follow program prescription -- Short: maintain consistent attendance Long:  continue to build stamina Short: Start with 1 day of exercise Long: Exercise independently at home    [place] Name 10/04/21 1042             Exercise Goal Re-Evaluation   Exercise Goals Review Increase Physical Activity;Increase Strength and Stamina       Comments Louie Casa tried out the T5 Nustep and was able to tolerate level 3. He should be encouraged to increase his T4 as well. He still has a hard time walking and can only tolerate 0.5 speed on the treadmill. Staff will encourage to increase speed as appropriate. Fortunately, his O2 stays above 88% during his exercise.       Expected Outcomes Short: Increase load on T4 and treadmill as tolerated  Long: Continue to increase overall MET level                Discharge Exercise Prescription (Final Exercise Prescription Changes):  Exercise Prescription Changes - 10/04/21 1000       Response to Exercise   Blood Pressure (Admit) 118/64    Blood Pressure (Exit) 142/70    Heart Rate (Admit) 83 bpm    Heart Rate (Exercise) 111 bpm    Heart Rate (Exit) 105 bpm    Oxygen Saturation (Admit) 98 %    Oxygen Saturation (Exercise) 94 %    Oxygen Saturation (Exit) 98 %    Rating of Perceived Exertion (Exercise) 12    Perceived Dyspnea (Exercise) 3    Symptoms SOB    Duration Progress to 30 minutes of  aerobic without signs/symptoms of physical distress    Intensity THRR unchanged      Progression   Progression Continue to progress workloads to maintain intensity without signs/symptoms of physical distress.    Average METs 1.88      Resistance Training   Training Prescription Yes    Weight 3 lb    Reps 10-15      Interval Training   Interval Training No      Oxygen   Oxygen Continuous    Liters 3      Treadmill   MPH 0.5    Grade 0    Minutes 15    METs 1.4      NuStep    Level 2    Minutes 15    METs 1.8      T5 Nustep   Level 3    Minutes 15    METs 1.9      Biostep-RELP   Level 1    Minutes 15    METs 2      Home Exercise Plan   Plans to continue exercise at Home (comment)   treadmill & weight machines   Frequency Add 3 additional days to program exercise sessions.   start with 1 day   Initial Home Exercises Provided 09/29/21      Oxygen   Maintain Oxygen Saturation 88% or higher             Nutrition:  Target Goals: Understanding of nutrition guidelines, daily intake of sodium <1558m, cholesterol <2077m calories 30% from fat and 7% or less from saturated fats, daily to have 5 or more servings of fruits and vegetables.  Education: All About Nutrition: -Group instruction provided by verbal, written material, interactive activities, discussions, models, and posters to present general guidelines for heart healthy nutrition including fat, fiber, MyPlate, the role of sodium in heart healthy nutrition, utilization of the nutrition label, and utilization of this knowledge for meal planning. Follow up email sent as well. Written material given at graduation.   Biometrics:  Pre Biometrics - 08/12/21 1531       Pre Biometrics   Height _0  (1.753 m)    Weight 172 lb 11.2 oz (78.3 kg)    BMI (Calculated) 25.49    Single Leg Stand 1.78 seconds              Nutrition Therapy Plan and Nutrition Goals:  Nutrition Therapy & Goals - 10/04/21 1435       Nutrition Therapy   Diet Heart healthy, low Na, pulmonary MNT    Drug/Food Interactions Statins/Certain Fruits    Protein (specify units) 80-90g    Fiber 25 grams  Whole Grain Foods 3 servings    Saturated Fats 12 max. grams    Fruits and Vegetables 8 servings/day    Sodium 2 grams      Personal Nutrition Goals   Nutrition Goal ST: try oats with egg whites, try gummy vitamins, try blending spinach with fairlife LT: meet nutritional needs from vitamins, minerals, and protein     Comments 66 y.o. M admitted to rehab for pulmonary fibrosis also presenting with iron-deficiency anemia, T2DM (latest A1C 5.7), HLD, HLD, GERD, COPD, atherosclerosis. Relevant medications include lipitor, vit D3, colace, slow Fe, MVI, metformin, januvia, prednisone. PYP Score: 71. Vegetables & Fruits 5/12. Breads, Grains & Cereals 8/12. Red & Processed Meat 11/12. Poultry 2/2. Fish & Shellfish 0/4. Beans, Nuts & Seeds 2/4. Milk & Dairy Foods 3/6. Toppings, Oils, Seasonings & Salt 15/20. Sweets, Snacks & Restaurant Food 14/14. Beverages 10/10. He drinks protein shakes (fairlife) Cereal (honeynut cheerios with 2% milk)  and oatmeal with toast (white toast with nothing on it in the oatmeal). Drinks: diet pepsi he has cut back on (3-4 10z bottles), and sometimes water 1 small bottle of water some days. He has had cracker jacks which he feels made him gain some weight. Last A1C 5.7. BG: high of 150, low of 80. His stroke has messed with his taste - does not eat meat, chicken, fish, eggs, he can eat beans and lentils occasionally, yogurt, cottage cheese, nuts/seeds, peanut butter. He likes fruit - grapes, pineapple, strawberries (afternoon snack). He likes cheese and crackers (afternoon snack). He does not like ensure or boost, his wife reports they have been through every protein shake and he would only drink the fairlife shake - provides 26g protein and 50% calcium - cautioned that while calcium is important, we can get too much. Louie Casa reports they have spoken to dietitans before regarding this and they have suggested the same kinds of things. He stopped his multivitamin due to it hurting his stomach, suggested trying a gummy vitamin as these can be easier on his stomach; it typically will not have iron due to the taste, but he is taking slow Fe. His wife reports he also takes capsules that has dried fruits and vegetables in them - he does not like vegetable and reports he won't have them - suggested trying to blend  fairlife shake with some spinach and see if he can tolerate that. Also recommended adding egg whiles to oatmeal as a protein source. Discussed heart healthy eating, diabetes friendly eating, and pulmonary MNT.      Intervention Plan   Intervention Prescribe, educate and counsel regarding individualized specific dietary modifications aiming towards targeted core components such as weight, hypertension, lipid management, diabetes, heart failure and other comorbidities.;Nutrition handout(s) given to patient.    Expected Outcomes Short Term Goal: Understand basic principles of dietary content, such as calories, fat, sodium, cholesterol and nutrients.;Short Term Goal: A plan has been developed with personal nutrition goals set during dietitian appointment.;Long Term Goal: Adherence to prescribed nutrition plan.             Nutrition Assessments:  MEDIFICTS Score Key: ?70 Need to make dietary changes  40-70 Heart Healthy Diet ? 40 Therapeutic Level Cholesterol Diet  Flowsheet Row Pulmonary Rehab from 08/12/2021 in [facility] Cardiac and Pulmonary Rehab  Picture Your Plate Total Score on Admission 71      Picture Your Plate Scores: <67 Unhealthy dietary pattern with much room for improvement. 41-50 Dietary pattern unlikely to meet recommendations for good health  and room for improvement. 51-60 More healthful dietary pattern, with some room for improvement.  >60 Healthy dietary pattern, although there may be some specific behaviors that could be improved.   Nutrition Goals Re-Evaluation:  Nutrition Goals Re-Evaluation     [place] Name 09/08/21 1342 09/29/21 1517           Goals   Current Weight 171 lb (77.6 kg) --      Nutrition Goal Eat more protien. Eat smaller meals. Not established with RD.      Comment Patient has had a stroke and he still feels like things taste and smell bad from it.Patient was informed on why it is important to maintain a balanced diet when dealing with Respiratory  issues. Explained that it takes a lot of energy to breath and when they are short of breath often they will need to have a good diet to help keep up with the calories they are expending for breathing. Louie Casa has not met with the RD yet and will be meeting with her next week.      Expected Outcome Short: eat more protien. Long: maintain protien and blood glucose. Short: Meet with RD and establish goals Long: Continue to follow healthy pulmonary based diet               Nutrition Goals Discharge (Final Nutrition Goals Re-Evaluation):  Nutrition Goals Re-Evaluation - 09/29/21 1517       Goals   Nutrition Goal Not established with RD.    Comment Louie Casa has not met with the RD yet and will be meeting with her next week.    Expected Outcome Short: Meet with RD and establish goals Long: Continue to follow healthy pulmonary based diet             Psychosocial: Target Goals: Acknowledge presence or absence of significant depression and/or stress, maximize coping skills, provide positive support system. Participant is able to verbalize types and ability to use techniques and skills needed for reducing stress and depression.   Education: Stress, Anxiety, and Depression - Group verbal and visual presentation to define topics covered.  Reviews how body is impacted by stress, anxiety, and depression.  Also discusses healthy ways to reduce stress and to treat/manage anxiety and depression.  Written material given at graduation.   Education: Sleep Hygiene -Provides group verbal and written instruction about how sleep can affect your health.  Define sleep hygiene, discuss sleep cycles and impact of sleep habits. Review good sleep hygiene tips.    Initial Review & Psychosocial Screening:  Initial Psych Review & Screening - 07/08/21 1049       Initial Review   Current issues with Current Psychotropic Meds;History of Depression;Current Depression;Current Stress Concerns    Source of Stress  Concerns Chronic Illness    Comments He has anxiety and nothing in particular sets him off. His depression gets to him sometimes and will come and go.      Family Dynamics   Good Support System? Yes    Comments He can look to his wife for support. He can call his children if he needed help.      Barriers   Psychosocial barriers to participate in program There are no identifiable barriers or psychosocial needs.;The patient should benefit from training in stress management and relaxation.      Screening Interventions   Interventions Encouraged to exercise;To provide support and resources with identified psychosocial needs;Provide feedback about the scores to participant    Expected Outcomes  Long Term Goal: Stressors or current issues are controlled or eliminated.;Short Term goal: Utilizing psychosocial counselor, staff and physician to assist with identification of specific Stressors or current issues interfering with healing process. Setting desired goal for each stressor or current issue identified.;Short Term goal: Identification and review with participant of any Quality of Life or Depression concerns found by scoring the questionnaire.;Long Term goal: The participant improves quality of Life and PHQ9 Scores as seen by post scores and/or verbalization of changes             Quality of Life Scores:  Scores of 19 and below usually indicate a poorer quality of life in these areas.  A difference of  2-3 points is a clinically meaningful difference.  A difference of 2-3 points in the total score of the Quality of Life Index has been associated with significant improvement in overall quality of life, self-image, physical symptoms, and general health in studies assessing change in quality of life.  PHQ-9: Recent Review Flowsheet Data     Depression screen [facility] 2/9 09/08/2021 08/12/2021 01/24/2018 10/18/2017 03/01/2017   Decreased Interest 3 3 0 1 1   Down, Depressed, Hopeless 1 2 0 1 1   PHQ - 2  Score 4 5 0 2 2   Altered sleeping 3 3  - 1 1   Tired, decreased energy 3 3 - 1 2   Change in appetite 3 2  - 1 0   Feeling bad or failure about yourself  0 2 - 1 1   Trouble concentrating 3 2 - 0 0   Moving slowly or fidgety/restless 1 1 - 0 -   Suicidal thoughts 0 0 - 0 0   PHQ-9 Score 17 18 - 6 6   Difficult doing work/chores Somewhat difficult Very difficult - - -      Interpretation of Total Score  Total Score Depression Severity:  1-4 = Minimal depression, 5-9 = Mild depression, 10-14 = Moderate depression, 15-19 = Moderately severe depression, 20-27 = Severe depression   Psychosocial Evaluation and Intervention:  Psychosocial Evaluation - 07/08/21 1051       Psychosocial Evaluation & Interventions   Interventions Encouraged to exercise with the program and follow exercise prescription;Stress management education;Relaxation education    Comments He has anxiety and nothing in particular sets him off. His depression gets to him sometimes and will come and go.He can look to his wife for support. He can call his children if he needed help.    Expected Outcomes Short: Start [facility] to help with mood. Long: Maintain a healthy mental state.    Continue Psychosocial Services  Follow up required by staff             Psychosocial Re-Evaluation:  Psychosocial Re-Evaluation     [place] Name 09/08/21 1348 09/29/21 1352           Psychosocial Re-Evaluation   Current issues with Current Psychotropic Meds;Current Depression;History of Depression;Current Anxiety/Panic Current Psychotropic Meds;Current Depression;History of Depression;Current Anxiety/Panic      Comments Reviewed patient health questionnaire (PHQ-9) with patient for follow up. Previously, patients score indicated signs/symptoms of depression.  Reviewed to see if patient is improving symptom wise while in program.  Score improved and patient states that it is becuase he is able to come and workout. He has alot to improve  upon and has a positive attitude about it. Randy wakes up at 4 am and has trouble going back to sleep. He used to get  up at that time for work and can't get out of the habit. He takes anti-depressant which helps him with his sleep a little bit. He is not interested in taking anything else. His wife is good support and does not have any other concerns at this time.      Expected Outcomes Short: Continue to attend [facility] regularly for regular exercise and social engagement. Long: Continue to improve symptoms and manage a positive mental state. Short: Continue attendance Long: Utilize exercise for stress management and maintain positive attitude      Interventions Encouraged to attend Pulmonary Rehabilitation for the exercise Encouraged to attend Pulmonary Rehabilitation for the exercise      Continue Psychosocial Services  Follow up required by staff Follow up required by staff               Psychosocial Discharge (Final Psychosocial Re-Evaluation):  Psychosocial Re-Evaluation - 09/29/21 1352       Psychosocial Re-Evaluation   Current issues with Current Psychotropic Meds;Current Depression;History of Depression;Current Anxiety/Panic    Comments Louie Casa wakes up at 4 am and has trouble going back to sleep. He used to get up at that time for work and can't get out of the habit. He takes anti-depressant which helps him with his sleep a little bit. He is not interested in taking anything else. His wife is good support and does not have any other concerns at this time.    Expected Outcomes Short: Continue attendance Long: Utilize exercise for stress management and maintain positive attitude    Interventions Encouraged to attend Pulmonary Rehabilitation for the exercise    Continue Psychosocial Services  Follow up required by staff             Education: Education Goals: Education classes will be provided on a weekly basis, covering required topics. Participant will state understanding/return  demonstration of topics presented.  Learning Barriers/Preferences:  Learning Barriers/Preferences - 07/08/21 1048       Learning Barriers/Preferences   Learning Barriers None    Learning Preferences None             General Pulmonary Education Topics:  Infection Prevention: - Provides verbal and written material to individual with discussion of infection control including proper hand washing and proper equipment cleaning during exercise session. Flowsheet Row Pulmonary Rehab from 08/12/2021 in [facility] Cardiac and Pulmonary Rehab  Date 08/12/21  Educator AS  Instruction Review Code 1- Verbalizes Understanding       Falls Prevention: - Provides verbal and written material to individual with discussion of falls prevention and safety. Flowsheet Row Pulmonary Rehab from 08/12/2021 in [facility] Cardiac and Pulmonary Rehab  Date 08/12/21  Educator AS  Instruction Review Code 1- Verbalizes Understanding       Chronic Lung Disease Review: - Group verbal instruction with posters, models, PowerPoint presentations and videos,  to review new updates, new respiratory medications, new advancements in procedures and treatments. Providing information on websites and "800" numbers for continued self-education. Includes information about supplement oxygen, available portable oxygen systems, continuous and intermittent flow rates, oxygen safety, concentrators, and Medicare reimbursement for oxygen. Explanation of Pulmonary Drugs, including class, frequency, complications, importance of spacers, rinsing mouth after steroid MDI's, and proper cleaning methods for nebulizers. Review of basic lung anatomy and physiology related to function, structure, and complications of lung disease. Review of risk factors. Discussion about methods for diagnosing sleep apnea and types of masks and machines for OSA. Includes a review of the use of types  of environmental controls: home humidity, furnaces, filters, dust  mite/pet prevention, HEPA vacuums. Discussion about weather changes, air quality and the benefits of nasal washing. Instruction on Warning signs, infection symptoms, calling MD promptly, preventive modes, and value of vaccinations. Review of effective airway clearance, coughing and/or vibration techniques. Emphasizing that all should Create an Action Plan. Written material given at graduation. Flowsheet Row Pulmonary Rehab from 08/12/2021 in [facility] Cardiac and Pulmonary Rehab  Date 08/12/21       AED/CPR: - Group verbal and written instruction with the use of models to demonstrate the basic use of the AED with the basic ABC's of resuscitation.    Anatomy and Cardiac Procedures: - Group verbal and visual presentation and models provide information about basic cardiac anatomy and function. Reviews the testing methods done to diagnose heart disease and the outcomes of the test results. Describes the treatment choices: Medical Management, Angioplasty, or Coronary Bypass Surgery for treating various heart conditions including Myocardial Infarction, Angina, Valve Disease, and Cardiac Arrhythmias.  Written material given at graduation.   Medication Safety: - Group verbal and visual instruction to review commonly prescribed medications for heart and lung disease. Reviews the medication, class of the drug, and side effects. Includes the steps to properly store meds and maintain the prescription regimen.  Written material given at graduation.   Other: -Provides group and verbal instruction on various topics (see comments)   Knowledge Questionnaire Score:  Knowledge Questionnaire Score - 08/12/21 1532       Knowledge Questionnaire Score   Pre Score 15/18              Core Components/Risk Factors/Patient Goals at Admission:  Personal Goals and Risk Factors at Admission - 08/12/21 1538       Core Components/Risk Factors/Patient Goals on Admission    Weight Management Yes;Weight Loss     Intervention Weight Management: Develop a combined nutrition and exercise program designed to reach desired caloric intake, while maintaining appropriate intake of nutrient and fiber, sodium and fats, and appropriate energy expenditure required for the weight goal.;Weight Management: Provide education and appropriate resources to help participant work on and attain dietary goals.;Weight Management/Obesity: Establish reasonable short term and long term weight goals.    Admit Weight 172 lb 11.2 oz (78.3 kg)    Goal Weight: Short Term 175 lb (79.4 kg)    Goal Weight: Long Term 170 lb (77.1 kg)    Expected Outcomes Short Term: Continue to assess and modify interventions until short term weight is achieved;Long Term: Adherence to nutrition and physical activity/exercise program aimed toward attainment of established weight goal;Weight Loss: Understanding of general recommendations for a balanced deficit meal plan, which promotes 1-2 lb weight loss per week and includes a negative energy balance of [phone] kcal/d;Understanding recommendations for meals to include 15-35% energy as protein, 25-35% energy from fat, 35-60% energy from carbohydrates, less than 267m of dietary cholesterol, 20-35 gm of total fiber daily;Understanding of distribution of calorie intake throughout the day with the consumption of 4-5 meals/snacks    Improve shortness of breath with ADL's Yes    Intervention Provide education, individualized exercise plan and daily activity instruction to help decrease symptoms of SOB with activities of daily living.    Expected Outcomes Short Term: Improve cardiorespiratory fitness to achieve a reduction of symptoms when performing ADLs;Long Term: Be able to perform more ADLs without symptoms or delay the onset of symptoms    Diabetes Yes    Intervention Provide education about signs/symptoms and  action to take for hypo/hyperglycemia.;Provide education about proper nutrition, including hydration, and  aerobic/resistive exercise prescription along with prescribed medications to achieve blood glucose in normal ranges: Fasting glucose 65-99 mg/dL    Expected Outcomes Short Term: Participant verbalizes understanding of the signs/symptoms and immediate care of hyper/hypoglycemia, proper foot care and importance of medication, aerobic/resistive exercise and nutrition plan for blood glucose control.;Long Term: Attainment of HbA1C < 7%.    Hypertension Yes    Intervention Provide education on lifestyle modifcations including regular physical activity/exercise, weight management, moderate sodium restriction and increased consumption of fresh fruit, vegetables, and low fat dairy, alcohol moderation, and smoking cessation.;Monitor prescription use compliance.    Expected Outcomes Short Term: Continued assessment and intervention until BP is < 140/89m HG in hypertensive participants. < 130/85mHG in hypertensive participants with diabetes, heart failure or chronic kidney disease.;Long Term: Maintenance of blood pressure at goal levels.    Lipids Yes    Intervention Provide education and support for participant on nutrition & aerobic/resistive exercise along with prescribed medications to achieve LDL <7032mHDL >66m81m  Expected Outcomes Short Term: Participant states understanding of desired cholesterol values and is compliant with medications prescribed. Participant is following exercise prescription and nutrition guidelines.;Long Term: Cholesterol controlled with medications as prescribed, with individualized exercise RX and with personalized nutrition plan. Value goals: LDL < 70mg66mL > 40 mg.             Education:Diabetes - Individual verbal and written instruction to review signs/symptoms of diabetes, desired ranges of glucose level fasting, after meals and with exercise. Acknowledge that pre and post exercise glucose checks will be done for 3 sessions at entry of program. Flowsheet Row [facility]  Date 07/08/21  Educator [facility] 1- Verbalizes Understanding       Know Your Numbers and Heart Failure: - Group verbal and visual instruction to discuss disease risk factors for cardiac and pulmonary disease and treatment options.  Reviews associated critical values for Overweight/Obesity, Hypertension, Cholesterol, and Diabetes.  Discusses basics of heart failure: signs/symptoms and treatments.  Introduces Heart Failure Zone chart for action plan for heart failure.  Written material given at graduation.   Core Components/Risk Factors/Patient Goals Review:   Goals and Risk Factor Review     Row Name 09/08/21 1341 09/29/21 1348           Core Components/Risk Factors/Patient Goals Review   Personal Goals Review Improve shortness of breath with ADL's Improve shortness of breath with ADL's;Diabetes;Weight Management/Obesity;Hypertension      Review Spoke to patient about their shortness of breath and what they can do to improve. Patient has been informed of breathing techniques when starting the program. Patient is informed to tell staff if they have had any med changes and that certain meds they are taking or not taking can be causing shortness of breath. RandyLouie Casabeen checking his sugars, ranging at 150 high and 100 low which he says is doctor's goal. He is taking all his medications as prescribed. He is trying to lose weight but realized he hasn't lost any  when he ate cracker jacks but wasn't looking at the serving size and has been eating the entire package. He needs to get a BP cuff for home and plans to start checking it at home once he gets it. BP at rehab have been stable. He hasn't seen much difference with his breathing since starting  but hopes that will change once he gets a little more into the program.      Expected Outcomes Short: Attend [facility] regularly to improve shortness of breath with ADLs. Long:  maintain independence with ADLs Short: Continue working on weight loss and look at serving size Long: Continue to manage lifestyle risk factors               Core Components/Risk Factors/Patient Goals at Discharge (Final Review):   Goals and Risk Factor Review - 09/29/21 1348       Core Components/Risk Factors/Patient Goals Review   Personal Goals Review Improve shortness of breath with ADL's;Diabetes;Weight Management/Obesity;Hypertension    Review Louie Casa has been checking his sugars, ranging at 150 high and 100 low which he says is doctor's goal. He is taking all his medications as prescribed. He is trying to lose weight but realized he hasn't lost any  when he ate cracker jacks but wasn't looking at the serving size and has been eating the entire package. He needs to get a BP cuff for home and plans to start checking it at home once he gets it. BP at rehab have been stable. He hasn't seen much difference with his breathing since starting but hopes that will change once he gets a little more into the program.    Expected Outcomes Short: Continue working on weight loss and look at serving size Long: Continue to manage lifestyle risk factors             ITP Comments:  ITP Comments     Row Name 07/08/21 1045 08/12/21 1542 08/18/21 0833 08/30/21 1336 09/15/21 0916   ITP Comments Virtual Visit completed. Patient informed on EP and RD appointment and 6 Minute walk test. Patient also informed of patient health questionnaires on My Chart. Patient Verbalizes understanding. Visit diagnosis can be found in [facility] 05/10/2021. Completed 6MWT and gym orientation. Initial ITP created and sent for review to Dr. Ottie Glazier, Medical Director. 30 Day review completed. Medical Director ITP review done, changes made as directed, and signed approval by Medical Director. First full day of exercise!  Patient was oriented to gym and equipment including functions, settings, policies, and procedures.  Patient's  individual exercise prescription and treatment plan were reviewed.  All starting workloads were established based on the results of the 6 minute walk test done at initial orientation visit.  The plan for exercise progression was also introduced and progression will be customized based on patient's performance and goals. 30 Day review completed. Medical Director ITP review done, changes made as directed, and signed approval by Medical Director.   new to program    Row Name 10/04/21 1531 10/13/21 0912         ITP Comments Completed initial RD consultation 30 Day review completed. Medical Director ITP review done, changes made as directed, and signed approval by Medical Director.               Comments:

## 2021-10-13 NOTE — Progress Notes (Signed)
Daily Session Note  Patient Details  Name: Kirk Robinson Born MRN: [id number] Date of Birth: Jan 28, 1956 Referring Provider:   Flowsheet Row Pulmonary Rehab from 08/12/2021 in [facility]  Referring Provider Raul Del       Encounter Date: 10/13/2021  Check In:  Session Check In - 10/13/21 1329       Check-In   Supervising physician immediately available to respond to emergencies See telemetry face sheet for immediately available ER MD    Location [facility]    Staff Present Birdie Sons, MPA, RN;Joseph Garnette Czech [place], MS, ASCM CEP, Exercise Physiologist    Virtual Visit No    Medication changes reported     No    Fall or balance concerns reported    No    Tobacco Cessation No Change    Warm-up and Cool-down Performed on first and last piece of equipment    Resistance Training Performed Yes    VAD Patient? No    PAD/SET Patient? No      Pain Assessment   Currently in Pain? No/denies                Social History   Tobacco Use  Smoking Status Former   Packs/day: 1.00   Years: 30.00   Pack years: 30.00   Types: Cigarettes   Quit date: 08/22/2020   Years since quitting: 1.1  Smokeless Tobacco Never    Goals Met:  Independence with exercise equipment Exercise tolerated well No report of concerns or symptoms today Strength training completed today  Goals Unmet:  Not Applicable  Comments: Pt able to follow exercise prescription today without complaint.  Will continue to monitor for progression.    Dr. Emily Filbert is Medical Director for [place].  Dr. Ottie Glazier is Medical Director for [facility] Pulmonary Rehabilitation.

## 2021-10-18 ENCOUNTER — Other Ambulatory Visit: Payer: Self-pay

## 2021-10-18 DIAGNOSIS — J8410 Pulmonary fibrosis, unspecified: Secondary | ICD-10-CM

## 2021-10-18 NOTE — Progress Notes (Signed)
Daily Session Note  Patient Details  Name: Kirk Robinson MRN: [id number] Date of Birth: 03/07/1956 Referring Provider:   Flowsheet Row Pulmonary Rehab from 08/12/2021 in [facility]  Referring Provider Raul Del       Encounter Date: 10/18/2021  Check In:  Session Check In - 10/18/21 1331       Check-In   Supervising physician immediately available to respond to emergencies See telemetry face sheet for immediately available ER MD    Location [facility]    Staff Present Birdie Sons, MPA, RN;Joseph Garnette Czech [place], MS, ASCM CEP, Exercise Physiologist    Virtual Visit No    Medication changes reported     No    Fall or balance concerns reported    No    Tobacco Cessation No Change    Warm-up and Cool-down Performed on first and last piece of equipment    Resistance Training Performed Yes    VAD Patient? No    PAD/SET Patient? No      Pain Assessment   Currently in Pain? No/denies                Social History   Tobacco Use  Smoking Status Former   Packs/day: 1.00   Years: 30.00   Pack years: 30.00   Types: Cigarettes   Quit date: 08/22/2020   Years since quitting: 1.1  Smokeless Tobacco Never    Goals Met:  Independence with exercise equipment Exercise tolerated well No report of concerns or symptoms today Strength training completed today  Goals Unmet:  Not Applicable  Comments: Pt able to follow exercise prescription today without complaint.  Will continue to monitor for progression.    Dr. Emily Filbert is Medical Director for [place].  Dr. Ottie Glazier is Medical Director for [facility] Pulmonary Rehabilitation.

## 2021-10-19 ENCOUNTER — Ambulatory Visit (INDEPENDENT_AMBULATORY_CARE_PROVIDER_SITE_OTHER): Payer: Medicare HMO | Admitting: Vascular Surgery

## 2021-10-19 ENCOUNTER — Other Ambulatory Visit: Payer: Self-pay

## 2021-10-19 VITALS — BP 115/67 | HR 105 | Resp 17 | Ht 69.0 in | Wt 173.0 lb

## 2021-10-19 DIAGNOSIS — E119 Type 2 diabetes mellitus without complications: Secondary | ICD-10-CM

## 2021-10-19 DIAGNOSIS — E7800 Pure hypercholesterolemia, unspecified: Secondary | ICD-10-CM | POA: Diagnosis not present

## 2021-10-19 DIAGNOSIS — I70212 Atherosclerosis of native arteries of extremities with intermittent claudication, left leg: Secondary | ICD-10-CM

## 2021-10-19 DIAGNOSIS — J411 Mucopurulent chronic bronchitis: Secondary | ICD-10-CM

## 2021-10-19 NOTE — Assessment & Plan Note (Signed)
Has been sometime since his circulation has been checked.  We will get ABIs in the near future at his convenience.

## 2021-10-19 NOTE — Progress Notes (Signed)
MRN : [id number]  Kirk Robinson is a 66 y.o. (May 06, 1956) male who presents with chief complaint of  Chief Complaint  Patient presents with   Follow-up  .  History of Present Illness: Patient returns today in follow up of his PAD.  His legs really have been giving out on him and giving him a lot of trouble.  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  At last check which was almost a year ago, his circulation was normal.  He has continued on his medications including aspirin, Plavix, and a statin agent.  Current Outpatient Medications  Medication Sig Dispense Refill   Accu-Chek FastClix Lancets MISC Apply topically daily.     albuterol (VENTOLIN HFA) 108 (90 Base) MCG/ACT inhaler Inhale 2 puffs into the lungs every 6 (six) hours as needed for wheezing or shortness of breath. 18 g 0   ARIPiprazole (ABILIFY) 2 MG tablet Take 2 mg by mouth daily.     ARMOUR THYROID 120 MG tablet Take 120 mg by mouth every morning.     ASPIRIN LOW DOSE 81 MG EC tablet TAKE 1 TABLET BY MOUTH EVERY DAY 90 tablet 4   atorvastatin (LIPITOR) 20 MG tablet Take 1 tablet (20 mg total) by mouth daily at 6 PM. 90 tablet 3   Blood Glucose Monitoring Suppl (ACCU-CHEK GUIDE) w/Device KIT See admin instructions.     BREZTRI AEROSPHERE 160-9-4.8 MCG/ACT AERO SMARTSIG:2 Inhalation Via Inhaler Twice Daily     clopidogrel (PLAVIX) 75 MG tablet TAKE 1 TABLET BY MOUTH EVERY DAY 90 tablet 3   diazepam (VALIUM) 5 MG tablet Take 5 mg by mouth 2 (two) times daily.      diltiazem (CARDIZEM CD) 120 MG 24 hr capsule Take by mouth daily.     Docusate Sodium 100 MG capsule Take 1 tablet by mouth in the morning, at noon, and at bedtime.     ESBRIET 267 MG TABS Take 3 tablets by mouth 3 (three) times daily as needed.     glucose blood test strip See admin instructions.     metFORMIN (GLUCOPHAGE) 1000 MG tablet TAKE 1 TABLET BY MOUTH 2 TIMES  DAILY WITH MEAL. (Patient taking differently: Take 1,000 mg by mouth 2 (two) times daily with a meal.) 180 tablet 0   metoprolol succinate (TOPROL-XL) 25 MG 24 hr tablet Take 1 tablet by mouth daily.     mirtazapine (REMERON) 30 MG tablet Take 30 mg by mouth at bedtime.     morphine (MS CONTIN) 30 MG 12 hr tablet SMARTSIG:1 Tablet(s) By Mouth Every 12 Hours     naloxone (NARCAN) nasal spray 4 mg/0.1 mL Place into the nose.     pantoprazole (PROTONIX) 40 MG tablet Take by mouth.     Pirfenidone 267 MG TABS Take by mouth.     predniSONE (DELTASONE) 10 MG tablet Take 20 mg by mouth daily with breakfast.     venlafaxine XR (EFFEXOR-XR) 150 MG 24 hr capsule Take 1 capsule (150 mg total) by mouth daily with breakfast. 90 capsule 1   Vitamin D, Ergocalciferol, (DRISDOL) 1.25 MG (50000 UNIT) CAPS capsule Take by mouth.     alendronate (FOSAMAX) 70 MG tablet Take by mouth. (Patient not taking: Reported on 06/11/2020)     clobetasol ointment (TEMOVATE) 0.05 % Apply topically 2 (two) times daily. (Patient not taking: Reported on 07/08/2021)  diazepam (VALIUM) 5 MG tablet Take by mouth. (Patient not taking: Reported on 07/08/2021)     Ensure Max Protein (ENSURE MAX PROTEIN) LIQD Take 330 mLs (11 oz total) by mouth 2 (two) times daily. (Patient not taking: Reported on 06/11/2020) 330 mL 0   famotidine (PEPCID) 20 MG tablet Take by mouth.     hydrocortisone 2.5 % ointment Apply topically 2 (two) times daily. (Patient not taking: Reported on 10/19/2021)     linagliptin (TRADJENTA) 5 MG TABS tablet Take 1 tablet (5 mg total) by mouth daily. (Patient not taking: Reported on 07/08/2021) 30 tablet 0   metoprolol succinate (TOPROL-XL) 25 MG 24 hr tablet Take by mouth.     mirtazapine (REMERON) 15 MG tablet Take by mouth.     polyethylene glycol (MIRALAX / GLYCOLAX) 17 g packet Take 17 g by mouth daily. (Patient not taking: Reported on 10/19/2021) 30 each 0   polyethylene glycol-electrolytes (NULYTELY) 420 g  solution Prepare according to package instructions. Starting at 5:00 PM: Drink one 8 oz glass of mixture every 15 minutes until you finish half of the jug. Five hours prior to procedure, drink 8 oz glass of mixture every 15 minutes until it is all gone. Make sure you do not drink anything 4 hours prior to your procedure. (Patient not taking: Reported on 07/08/2021) 8000 mL 0   predniSONE (DELTASONE) 10 MG tablet Take 5 mg by mouth every evening.  (Patient not taking: Reported on 10/19/2021)     predniSONE (DELTASONE) 10 MG tablet Take 1 tablet by mouth daily. (Patient not taking: Reported on 07/08/2021)     rOPINIRole (REQUIP) 0.5 MG tablet Take by mouth.     sitaGLIPtin (JANUVIA) 100 MG tablet Take by mouth.     tapentadol HCl (NUCYNTA) 75 MG tablet Take 75 mg by mouth every 4 (four) hours as needed for moderate pain or severe pain.  (Patient not taking: Reported on 06/11/2020)     thiamine 100 MG tablet Take 1 tablet (100 mg total) by mouth daily. (Patient not taking: Reported on 06/11/2020) 30 tablet 0   No current facility-administered medications for this visit.    Past Medical History:  Diagnosis Date   Alcohol use 03/16/2007   patient risk factors   Allergic rhinitis, cause unspecified    Altered mental status    Anxiety state, unspecified    Cervicalgia 09/17/2007   COPD (chronic obstructive pulmonary disease) (HCC)    Diabetes mellitus without complication ([place])    Diplopia 12/21/2007   Esophageal reflux    Gastritis 05/13/2009   HCAP (healthcare-associated pneumonia) 08/12/2014   Hematuria 03/16/2007   Hyperthyroidism    graves disease   Iodine hypothyroidism    Memory loss    NSIP (nonspecific interstitial pneumonia) ([place])    on azathioprine   Osteoporosis, unspecified    bone density per [facility] in 2012   Other abnormal glucose 04/08/2008   Other diseases of lung, not elsewhere classified 01/23/2009   s/p [place]/pulmonology consult; intolerant  to Advair   Peripheral  vascular disease, unspecified ([place]) 12/24/2009   Personal history of colonic polyps    Pneumonia, organism unspecified([place])    Pure hypercholesterolemia    Shortness of breath    Stroke ([place]) 2005   Tobacco use disorder 03/16/2007   patient risk factors   Unspecified hypothyroidism 01/07/2010   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  CT head negative, MRI brain negative, EEG: diffuse slowing but no  seizure acitivity.  Labs negative.  [facility].   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   COLONOSCOPY WITH PROPOFOL N/A 03/10/2020   Procedure: COLONOSCOPY WITH PROPOFOL;  Surgeon: Jonathon Bellows, MD;  Location: [facility] ENDOSCOPY;  Service: Gastroenterology;  Laterality: N/A;   COLONOSCOPY WITH PROPOFOL N/A 03/16/2021   Procedure: COLONOSCOPY WITH PROPOFOL;  Surgeon: Jonathon Bellows, MD;  Location: [facility] ENDOSCOPY;  Service: Gastroenterology;  Laterality: N/A;   ESOPHAGOGASTRODUODENOSCOPY (EGD) WITH PROPOFOL N/A 03/16/2021   Procedure: ESOPHAGOGASTRODUODENOSCOPY (EGD) WITH PROPOFOL;  Surgeon: Jonathon Bellows, MD;  Location: [facility] ENDOSCOPY;  Service: Gastroenterology;  Laterality: N/A;   EYE SURGERY  2010   GIVENS CAPSULE STUDY N/A 04/16/2021   Procedure: GIVENS CAPSULE STUDY;  Surgeon: Jonathon Bellows, MD;  Location: [facility] ENDOSCOPY;  Service: Gastroenterology;  Laterality: N/A;   LAPAROSCOPIC REVISION VENTRICULAR-PERITONEAL (V-P) SHUNT Right 05/20/2014   Procedure: LAPAROSCOPIC REVISION VENTRICULAR-PERITONEAL (V-P) SHUNT;  Surgeon: Georganna Skeans, MD;  Location: [place] NEURO ORS;  Service: General;  Laterality: Right;   LOWER EXTREMITY ANGIOGRAPHY Left 06/11/2020   Procedure: LOWER EXTREMITY ANGIOGRAPHY;  Surgeon: Algernon Huxley, MD;  Location: [place] CV LAB;  Service: Cardiovascular;  Laterality: Left;   Neuropsychiatric evaluation  04/2012   poor short-term  memory, poor recall, delayed reaction time; permanently disabled.       Social History   Tobacco Use   Smoking status: Former    Packs/day: 1.00    Years: 30.00    Pack years: 30.00    Types: Cigarettes    Quit date: 08/22/2020    Years since quitting: 1.1   Smokeless tobacco: Never  Vaping Use   Vaping Use: Never used  Substance Use Topics   Alcohol use: No   Drug use: No       Family History  Problem Relation Age of Onset   Heart disease Father        cad, chf   Cancer Father    Heart disease Brother 3       CABG   Cancer Brother 71       pancreatic cancer   Diabetes Brother    Diabetes Brother    Heart disease Brother 23       CABG   Diabetes Brother    Heart disease Brother 5       CABG   Cancer Brother 65       prostate cancer   Heart disease Mother        cad   Diabetes Mother    Stroke Mother      Allergies  Allergen Reactions   Advair Diskus [Fluticasone-Salmeterol] Shortness Of Breath   Erythromycin Nausea And Vomiting   Iodinated Contrast Media Shortness Of Breath and Other (See Comments)   Ativan [Lorazepam] Other (See Comments)    Caused pt's heart to stop   Darvocet [Propoxyphene N-Acetaminophen] Nausea And Vomiting   Doxycycline Hyclate Nausea And Vomiting, Swelling and Other (See Comments)    Tongue swelling, severe depression   Propoxyphene Nausea And Vomiting   Septra [Sulfamethoxazole-Trimethoprim] Rash      REVIEW OF SYSTEMS (Negative unless checked)   Constitutional: '[]' Weight loss  '[]' Fever  '[]' Chills Cardiac: '[]' Chest pain   '[]' Chest pressure   '[]' Palpitations   '[]' Shortness of breath when laying flat   '[]' Shortness of breath at rest   '[]' Shortness of breath with exertion. Vascular:  '[x]' Pain in legs with walking   '[]'   Pain in legs at rest   '[]' Pain in legs when laying flat   '[x]' Claudication   '[]' Pain in feet when walking  '[]' Pain in feet at rest  '[]' Pain in feet when laying flat   '[]' History of DVT   '[]' Phlebitis   '[]' Swelling in legs    '[]' Varicose veins   '[]' Non-healing ulcers Pulmonary:   '[x]' Uses home oxygen   '[]' Productive cough   '[]' Hemoptysis   '[]' Wheeze  '[x]' COPD   '[]' Asthma Neurologic:  '[]' Dizziness  '[]' Blackouts   '[]' Seizures   '[x]' History of stroke   '[]' History of TIA  '[]' Aphasia   '[]' Temporary blindness   '[]' Dysphagia   '[]' Weakness or numbness in arms   '[]' Weakness or numbness in legs Musculoskeletal:  '[x]' Arthritis   '[]' Joint swelling   '[x]' Joint pain   '[]' Low back pain Hematologic:  '[]' Easy bruising  '[]' Easy bleeding   '[]' Hypercoagulable state   '[]' Anemic  '[]' Hepatitis Gastrointestinal:  '[]' Blood in stool   '[]' Vomiting blood  '[]' Gastroesophageal reflux/heartburn   '[]' Abdominal pain Genitourinary:  '[]' Chronic kidney disease   '[]' Difficult urination  '[]' Frequent urination  '[]' Burning with urination   '[]' Hematuria Skin:  '[]' Rashes   '[]' Ulcers   '[]' Wounds Psychological:  '[]' History of anxiety   '[]'  History of major depression.   Physical Examination  BP 115/67 (BP Location: Left Arm)    Pulse (!) 105    Resp 17    Ht '5\' 9"'  (1.753 m)    Wt 173 lb (78.5 kg)    BMI 25.55 kg/m  Gen:  WD/WN, NAD Head: [place]/AT, No temporalis wasting. Ear/Nose/Throat: Hearing grossly intact, nares w/o erythema or drainage Eyes: Conjunctiva clear. Sclera non-icteric Neck: Supple.  Trachea midline Pulmonary:  Good air movement, no use of accessory muscles, breathing is not labored on supplemental oxygen Cardiac: RRR, no JVD Vascular:  Vessel Right Left  Radial Palpable Palpable                          PT Palpable Palpable  DP Palpable Palpable   Gastrointestinal: soft, non-tender/non-distended. No guarding/reflex.  Musculoskeletal: M/S 5/5 throughout.  No deformity or atrophy. Trace LE edema. Neurologic: Sensation grossly intact in extremities.  Symmetrical.  Speech is fluent.  Psychiatric: Judgment intact, Mood & affect appropriate for pt's clinical situation. Dermatologic: No rashes or ulcers noted.  No cellulitis or open wounds.      Labs Recent Results (from  the past 2160 hour(s))  Glucose, capillary     Status: Abnormal   Collection Time: 08/30/21  1:35 PM  Result Value Ref Range   Glucose-Capillary 186 (H) 70 - 99 mg/dL    Comment: Glucose reference range applies only to samples taken after fasting for at least 8 hours.  Glucose, capillary     Status: None   Collection Time: 08/30/21  2:33 PM  Result Value Ref Range   Glucose-Capillary 86 70 - 99 mg/dL    Comment: Glucose reference range applies only to samples taken after fasting for at least 8 hours.  Glucose, capillary     Status: Abnormal   Collection Time: 09/01/21  1:27 PM  Result Value Ref Range   Glucose-Capillary 107 (H) 70 - 99 mg/dL    Comment: Glucose reference range applies only to samples taken after fasting for at least 8 hours.  Glucose, capillary     Status: Abnormal   Collection Time: 09/01/21  2:34 PM  Result Value Ref Range   Glucose-Capillary 105 (H) 70 - 99 mg/dL    Comment: Glucose reference range  applies only to samples taken after fasting for at least 8 hours.  Glucose, capillary     Status: Abnormal   Collection Time: 09/06/21  1:38 PM  Result Value Ref Range   Glucose-Capillary 153 (H) 70 - 99 mg/dL    Comment: Glucose reference range applies only to samples taken after fasting for at least 8 hours.  Glucose, capillary     Status: Abnormal   Collection Time: 09/06/21  2:37 PM  Result Value Ref Range   Glucose-Capillary 124 (H) 70 - 99 mg/dL    Comment: Glucose reference range applies only to samples taken after fasting for at least 8 hours.    Radiology No results found.  Assessment/Plan COPD (chronic obstructive pulmonary disease) On chronic oxygen therapy.  Followed by pulmonology.   Type 2 diabetes mellitus without complication, without long-term current use of insulin (HCC) blood glucose control important in reducing the progression of atherosclerotic disease. Also, involved in wound healing. On appropriate medications.      Hyperlipidemia lipid control important in reducing the progression of atherosclerotic disease. Continue statin therapy  Atherosclerosis of native arteries of extremity with intermittent claudication (HCC) Has been sometime since his circulation has been checked.  We will get ABIs in the near future at his convenience.    [name], MD  10/19/2021 3:22 PM    This note was created with Dragon medical transcription system.  Any errors from dictation are purely unintentional

## 2021-10-20 ENCOUNTER — Encounter: Payer: Medicare HMO | Attending: Specialist

## 2021-10-20 DIAGNOSIS — J8410 Pulmonary fibrosis, unspecified: Secondary | ICD-10-CM | POA: Diagnosis not present

## 2021-10-20 NOTE — Progress Notes (Signed)
Daily Session Note ? ?Patient Details  ?Name: Kirk Robinson ?MRN: [id number] ?Date of Birth: 1956/04/24 ?Referring Provider:   ?Flowsheet Row Pulmonary Rehab from 08/12/2021 in [facility] Cardiac and Pulmonary Rehab  ?Referring Provider Raul Del  ? ?  ? ? ?Encounter Date: 10/20/2021 ? ?Check In: ? Session Check In - 10/20/21 1329   ? ?  ? Check-In  ? Supervising physician immediately available to respond to emergencies See telemetry face sheet for immediately available ER MD   ? Location [facility]-Cardiac & Pulmonary Rehab   ? Staff Present Birdie Sons, MPA, RN;Joseph [place], RCP,RRT,BSRT;Kara [place], MS, ASCM CEP, Exercise Physiologist   ? Virtual Visit No   ? Medication changes reported     No   ? Fall or balance concerns reported    No   ? Tobacco Cessation No Change   ? Warm-up and Cool-down Performed on first and last piece of equipment   ? Resistance Training Performed Yes   ? VAD Patient? No   ? PAD/SET Patient? No   ?  ? Pain Assessment  ? Currently in Pain? No/denies   ? ?  ?  ? ?  ? ? ? ? ? ?Social History  ? ?Tobacco Use  ?Smoking Status Former  ? Packs/day: 1.00  ? Years: 30.00  ? Pack years: 30.00  ? Types: Cigarettes  ? Quit date: 08/22/2020  ? Years since quitting: 1.1  ?Smokeless Tobacco Never  ? ? ?Goals Met:  ?Independence with exercise equipment ?Exercise tolerated well ?No report of concerns or symptoms today ?Strength training completed today ? ?Goals Unmet:  ?Not Applicable ? ?Comments: Pt able to follow exercise prescription today without complaint.  Will continue to monitor for progression. ? ? ? ?Dr. Emily Filbert is Medical Director for [place].  ?Dr. Ottie Glazier is Medical Director for [facility] Pulmonary Rehabilitation. ?

## 2021-10-25 ENCOUNTER — Other Ambulatory Visit: Payer: Self-pay

## 2021-10-25 DIAGNOSIS — J8410 Pulmonary fibrosis, unspecified: Secondary | ICD-10-CM

## 2021-10-25 NOTE — Progress Notes (Signed)
Daily Session Note ? ?Patient Details  ?Name: Sequoyah Julio ?MRN: [id number] ?Date of Birth: 02-May-1956 ?Referring Provider:   ?Flowsheet Row Pulmonary Rehab from 08/12/2021 in [facility] Cardiac and Pulmonary Rehab  ?Referring Provider Raul Del  ? ?  ? ? ?Encounter Date: 10/25/2021 ? ?Check In: ? Session Check In - 10/25/21 1329   ? ?  ? Check-In  ? Supervising physician immediately available to respond to emergencies See telemetry face sheet for immediately available ER MD   ? Location [facility]-Cardiac & Pulmonary Rehab   ? Staff Present Birdie Sons, MPA, RN;Joseph [place], RCP,RRT,BSRT;Kara [place], MS, ASCM CEP, Exercise Physiologist   ? Virtual Visit No   ? Medication changes reported     No   ? Fall or balance concerns reported    No   ? Tobacco Cessation No Change   ? Warm-up and Cool-down Performed on first and last piece of equipment   ? Resistance Training Performed Yes   ? VAD Patient? No   ? PAD/SET Patient? No   ?  ? Pain Assessment  ? Currently in Pain? No/denies   ? ?  ?  ? ?  ? ? ? ? ? ?Social History  ? ?Tobacco Use  ?Smoking Status Former  ? Packs/day: 1.00  ? Years: 30.00  ? Pack years: 30.00  ? Types: Cigarettes  ? Quit date: 08/22/2020  ? Years since quitting: 1.1  ?Smokeless Tobacco Never  ? ? ?Goals Met:  ?Independence with exercise equipment ?Exercise tolerated well ?No report of concerns or symptoms today ?Strength training completed today ? ?Goals Unmet:  ?Not Applicable ? ?Comments: Pt able to follow exercise prescription today without complaint.  Will continue to monitor for progression. ? ? ? ?Dr. Emily Filbert is Medical Director for [place].  ?Dr. Ottie Glazier is Medical Director for [facility] Pulmonary Rehabilitation. ?

## 2021-10-27 ENCOUNTER — Other Ambulatory Visit: Payer: Self-pay

## 2021-10-27 DIAGNOSIS — J8410 Pulmonary fibrosis, unspecified: Secondary | ICD-10-CM | POA: Diagnosis not present

## 2021-10-27 NOTE — Progress Notes (Signed)
Daily Session Note ? ?Patient Details  ?Name: Kirk Robinson ?MRN: [id number] ?Date of Birth: 1955/11/21 ?Referring Provider:   ?Flowsheet Row Pulmonary Rehab from 08/12/2021 in [facility] Cardiac and Pulmonary Rehab  ?Referring Provider Raul Del  ? ?  ? ? ?Encounter Date: 10/27/2021 ? ?Check In: ? Session Check In - 10/27/21 1338   ? ?  ? Check-In  ? Supervising physician immediately available to respond to emergencies See telemetry face sheet for immediately available ER MD   ? Location [facility]-Cardiac & Pulmonary Rehab   ? Staff Present Birdie Sons, MPA, RN;Joseph [place], RCP,RRT,BSRT;Kara [place], MS, ASCM CEP, Exercise Physiologist   ? Virtual Visit No   ? Medication changes reported     No   ? Fall or balance concerns reported    No   ? Tobacco Cessation No Change   ? Warm-up and Cool-down Performed on first and last piece of equipment   ? Resistance Training Performed Yes   ? VAD Patient? No   ? PAD/SET Patient? No   ?  ? Pain Assessment  ? Currently in Pain? No/denies   ? ?  ?  ? ?  ? ? ? ? ? ?Social History  ? ?Tobacco Use  ?Smoking Status Former  ? Packs/day: 1.00  ? Years: 30.00  ? Pack years: 30.00  ? Types: Cigarettes  ? Quit date: 08/22/2020  ? Years since quitting: 1.1  ?Smokeless Tobacco Never  ? ? ?Goals Met:  ?Independence with exercise equipment ?Exercise tolerated well ?No report of concerns or symptoms today ?Strength training completed today ? ?Goals Unmet:  ?Not Applicable ? ?Comments: Pt able to follow exercise prescription today without complaint.  Will continue to monitor for progression. ? ? ? ?Dr. Emily Filbert is Medical Director for [place].  ?Dr. Ottie Glazier is Medical Director for [facility] Pulmonary Rehabilitation. ?

## 2021-11-03 ENCOUNTER — Other Ambulatory Visit: Payer: Self-pay

## 2021-11-03 DIAGNOSIS — J8410 Pulmonary fibrosis, unspecified: Secondary | ICD-10-CM | POA: Diagnosis not present

## 2021-11-03 NOTE — Progress Notes (Signed)
Daily Session Note ? ?Patient Details  ?Name: Kirk Robinson ?MRN: [id number] ?Date of Birth: 12/12/1955 ?Referring Provider:   ?Flowsheet Row Pulmonary Rehab from 08/12/2021 in [facility] Cardiac and Pulmonary Rehab  ?Referring Provider Raul Del  ? ?  ? ? ?Encounter Date: 11/03/2021 ? ?Check In: ? Session Check In - 11/03/21 1342   ? ?  ? Check-In  ? Supervising physician immediately available to respond to emergencies See telemetry face sheet for immediately available ER MD   ? Location [facility]-Cardiac & Pulmonary Rehab   ? Staff Present Birdie Sons, MPA, RN;Joseph [place], [place], MA, RCEP, CCRP, CCET;Melissa [place], [place], LDN   ? Virtual Visit No   ? Medication changes reported     No   ? Fall or balance concerns reported    No   ? Tobacco Cessation No Change   ? Warm-up and Cool-down Performed on first and last piece of equipment   ? Resistance Training Performed Yes   ? VAD Patient? No   ? PAD/SET Patient? No   ?  ? Pain Assessment  ? Currently in Pain? No/denies   ? ?  ?  ? ?  ? ? ? ? ? ?Social History  ? ?Tobacco Use  ?Smoking Status Former  ? Packs/day: 1.00  ? Years: 30.00  ? Pack years: 30.00  ? Types: Cigarettes  ? Quit date: 08/22/2020  ? Years since quitting: 1.2  ?Smokeless Tobacco Never  ? ? ?Goals Met:  ?Independence with exercise equipment ?Exercise tolerated well ?No report of concerns or symptoms today ?Strength training completed today ? ?Goals Unmet:  ?Not Applicable ? ?Comments: Pt able to follow exercise prescription today without complaint.  Will continue to monitor for progression. ? ? ? ?Dr. Emily Filbert is Medical Director for [place].  ?Dr. Ottie Glazier is Medical Director for [facility] Pulmonary Rehabilitation. ?

## 2021-11-08 ENCOUNTER — Other Ambulatory Visit: Payer: Self-pay

## 2021-11-08 DIAGNOSIS — J8410 Pulmonary fibrosis, unspecified: Secondary | ICD-10-CM | POA: Diagnosis not present

## 2021-11-08 NOTE — Progress Notes (Signed)
Daily Session Note ? ?Patient Details  ?Name: Kirk Robinson ?MRN: [id number] ?Date of Birth: 1955-10-28 ?Referring Provider:   ?Flowsheet Row Pulmonary Rehab from 08/12/2021 in [facility] Cardiac and Pulmonary Rehab  ?Referring Provider Raul Del  ? ?  ? ? ?Encounter Date: 11/08/2021 ? ?Check In: ? Session Check In - 11/08/21 1329   ? ?  ? Check-In  ? Supervising physician immediately available to respond to emergencies See telemetry face sheet for immediately available ER MD   ? Location [facility]-Cardiac & Pulmonary Rehab   ? Staff Present Birdie Sons, MPA, RN;Joseph [place], RCP,RRT,BSRT;Kara [place], MS, ASCM CEP, Exercise Physiologist   ? Virtual Visit No   ? Medication changes reported     No   ? Fall or balance concerns reported    No   ? Tobacco Cessation No Change   ? Warm-up and Cool-down Performed on first and last piece of equipment   ? Resistance Training Performed Yes   ? VAD Patient? No   ? PAD/SET Patient? No   ?  ? Pain Assessment  ? Currently in Pain? No/denies   ? ?  ?  ? ?  ? ? ? ? ? ?Social History  ? ?Tobacco Use  ?Smoking Status Former  ? Packs/day: 1.00  ? Years: 30.00  ? Pack years: 30.00  ? Types: Cigarettes  ? Quit date: 08/22/2020  ? Years since quitting: 1.2  ?Smokeless Tobacco Never  ? ? ?Goals Met:  ?Independence with exercise equipment ?Exercise tolerated well ?No report of concerns or symptoms today ?Strength training completed today ? ?Goals Unmet:  ?Not Applicable ? ?Comments: Pt able to follow exercise prescription today without complaint.  Will continue to monitor for progression. ? ? ? ?Dr. Emily Filbert is Medical Director for [place].  ?Dr. Ottie Glazier is Medical Director for [facility] Pulmonary Rehabilitation. ?

## 2021-11-10 ENCOUNTER — Encounter: Payer: Self-pay | Admitting: *Deleted

## 2021-11-10 DIAGNOSIS — J8410 Pulmonary fibrosis, unspecified: Secondary | ICD-10-CM

## 2021-11-10 NOTE — Progress Notes (Signed)
Pulmonary Individual Treatment Plan ? ?Patient Details  ?Name: Kirk Robinson ?MRN: [id number] ?Date of Birth: February 04, 1956 ?Referring Provider:   ?Flowsheet Row Pulmonary Rehab from 08/12/2021 in [facility] Cardiac and Pulmonary Rehab  ?Referring Provider Raul Del  ? ?  ? ? ?Initial Encounter Date:  ?Flowsheet Row Pulmonary Rehab from 08/12/2021 in [facility] Cardiac and Pulmonary Rehab  ?Date 08/12/21  ? ?  ? ? ?Visit Diagnosis: Pulmonary fibrosis ([place]) ? ?Patient's Home Medications on Admission: ? ?Current Outpatient Medications:  ?  Accu-Chek FastClix Lancets MISC, Apply topically daily., Disp: , Rfl:  ?  albuterol (VENTOLIN HFA) 108 (90 Base) MCG/ACT inhaler, Inhale 2 puffs into the lungs every 6 (six) hours as needed for wheezing or shortness of breath., Disp: 18 g, Rfl: 0 ?  alendronate (FOSAMAX) 70 MG tablet, Take by mouth. (Patient not taking: Reported on 06/11/2020), Disp: , Rfl:  ?  ARIPiprazole (ABILIFY) 2 MG tablet, Take 2 mg by mouth daily., Disp: , Rfl:  ?  ARMOUR THYROID 120 MG tablet, Take 120 mg by mouth every morning., Disp: , Rfl:  ?  ASPIRIN LOW DOSE 81 MG EC tablet, TAKE 1 TABLET BY MOUTH EVERY DAY, Disp: 90 tablet, Rfl: 4 ?  atorvastatin (LIPITOR) 20 MG tablet, Take 1 tablet (20 mg total) by mouth daily at 6 PM., Disp: 90 tablet, Rfl: 3 ?  Blood Glucose Monitoring Suppl (ACCU-CHEK GUIDE) w/Device KIT, See admin instructions., Disp: , Rfl:  ?  BREZTRI AEROSPHERE 160-9-4.8 MCG/ACT AERO, SMARTSIG:2 Inhalation Via Inhaler Twice Daily, Disp: , Rfl:  ?  clobetasol ointment (TEMOVATE) 0.05 %, Apply topically 2 (two) times daily. (Patient not taking: Reported on 07/08/2021), Disp: , Rfl:  ?  clopidogrel (PLAVIX) 75 MG tablet, TAKE 1 TABLET BY MOUTH EVERY DAY, Disp: 90 tablet, Rfl: 3 ?  diazepam (VALIUM) 5 MG tablet, Take 5 mg by mouth 2 (two) times daily. , Disp: , Rfl:  ?  diazepam (VALIUM) 5 MG tablet, Take by mouth. (Patient not taking: Reported on 07/08/2021), Disp: , Rfl:  ?  diltiazem (CARDIZEM CD) 120 MG 24 hr  capsule, Take by mouth daily., Disp: , Rfl:  ?  Docusate Sodium 100 MG capsule, Take 1 tablet by mouth in the morning, at noon, and at bedtime., Disp: , Rfl:  ?  Ensure Max Protein (ENSURE MAX PROTEIN) LIQD, Take 330 mLs (11 oz total) by mouth 2 (two) times daily. (Patient not taking: Reported on 06/11/2020), Disp: 330 mL, Rfl: 0 ?  ESBRIET 267 MG TABS, Take 3 tablets by mouth 3 (three) times daily as needed., Disp: , Rfl:  ?  famotidine (PEPCID) 20 MG tablet, Take by mouth., Disp: , Rfl:  ?  glucose blood test strip, See admin instructions., Disp: , Rfl:  ?  hydrocortisone 2.5 % ointment, Apply topically 2 (two) times daily. (Patient not taking: Reported on 10/19/2021), Disp: , Rfl:  ?  linagliptin (TRADJENTA) 5 MG TABS tablet, Take 1 tablet (5 mg total) by mouth daily. (Patient not taking: Reported on 07/08/2021), Disp: 30 tablet, Rfl: 0 ?  metFORMIN (GLUCOPHAGE) 1000 MG tablet, TAKE 1 TABLET BY MOUTH 2 TIMES DAILY WITH MEAL. (Patient taking differently: Take 1,000 mg by mouth 2 (two) times daily with a meal.), Disp: 180 tablet, Rfl: 0 ?  metoprolol succinate (TOPROL-XL) 25 MG 24 hr tablet, Take by mouth., Disp: , Rfl:  ?  metoprolol succinate (TOPROL-XL) 25 MG 24 hr tablet, Take 1 tablet by mouth daily., Disp: , Rfl:  ?  mirtazapine (REMERON) 15 MG  tablet, Take by mouth., Disp: , Rfl:  ?  mirtazapine (REMERON) 30 MG tablet, Take 30 mg by mouth at bedtime., Disp: , Rfl:  ?  morphine (MS CONTIN) 30 MG 12 hr tablet, SMARTSIG:1 Tablet(s) By Mouth Every 12 Hours, Disp: , Rfl:  ?  naloxone (NARCAN) nasal spray 4 mg/0.1 mL, Place into the nose., Disp: , Rfl:  ?  pantoprazole (PROTONIX) 40 MG tablet, Take by mouth., Disp: , Rfl:  ?  Pirfenidone 267 MG TABS, Take by mouth., Disp: , Rfl:  ?  polyethylene glycol (MIRALAX / GLYCOLAX) 17 g packet, Take 17 g by mouth daily. (Patient not taking: Reported on 10/19/2021), Disp: 30 each, Rfl: 0 ?  polyethylene glycol-electrolytes (NULYTELY) 420 g solution, Prepare according to  package instructions. Starting at 5:00 PM: Drink one 8 oz glass of mixture every 15 minutes until you finish half of the jug. Five hours prior to procedure, drink 8 oz glass of mixture every 15 minutes until it is all gone. Make sure you do not drink anything 4 hours prior to your procedure. (Patient not taking: Reported on 07/08/2021), Disp: 8000 mL, Rfl: 0 ?  predniSONE (DELTASONE) 10 MG tablet, Take 20 mg by mouth daily with breakfast., Disp: , Rfl:  ?  predniSONE (DELTASONE) 10 MG tablet, Take 5 mg by mouth every evening.  (Patient not taking: Reported on 10/19/2021), Disp: , Rfl:  ?  predniSONE (DELTASONE) 10 MG tablet, Take 1 tablet by mouth daily. (Patient not taking: Reported on 07/08/2021), Disp: , Rfl:  ?  rOPINIRole (REQUIP) 0.5 MG tablet, Take by mouth., Disp: , Rfl:  ?  sitaGLIPtin (JANUVIA) 100 MG tablet, Take by mouth., Disp: , Rfl:  ?  tapentadol HCl (NUCYNTA) 75 MG tablet, Take 75 mg by mouth every 4 (four) hours as needed for moderate pain or severe pain.  (Patient not taking: Reported on 06/11/2020), Disp: , Rfl:  ?  thiamine 100 MG tablet, Take 1 tablet (100 mg total) by mouth daily. (Patient not taking: Reported on 06/11/2020), Disp: 30 tablet, Rfl: 0 ?  venlafaxine XR (EFFEXOR-XR) 150 MG 24 hr capsule, Take 1 capsule (150 mg total) by mouth daily with breakfast., Disp: 90 capsule, Rfl: 1 ?  Vitamin D, Ergocalciferol, (DRISDOL) 1.25 MG (50000 UNIT) CAPS capsule, Take by mouth., Disp: , Rfl:  ? ?Past Medical History: ?Past Medical History:  ?Diagnosis Date  ? Alcohol use 03/16/2007  ? patient risk factors  ? Allergic rhinitis, cause unspecified   ? Altered mental status   ? Anxiety state, unspecified   ? Cervicalgia 09/17/2007  ? COPD (chronic obstructive pulmonary disease) ([place])   ? Diabetes mellitus without complication ([place])   ? Diplopia 12/21/2007  ? Esophageal reflux   ? Gastritis 05/13/2009  ? HCAP (healthcare-associated pneumonia) 08/12/2014  ? Hematuria 03/16/2007  ? Hyperthyroidism   ?  graves disease  ? Iodine hypothyroidism   ? Memory loss   ? NSIP (nonspecific interstitial pneumonia) ([place])   ? on azathioprine  ? Osteoporosis, unspecified   ? bone density per [facility] in 2012  ? Other abnormal glucose 04/08/2008  ? Other diseases of lung, not elsewhere classified 01/23/2009  ? s/p Fleming/pulmonology consult; intolerant  to Advair  ? Peripheral vascular disease, unspecified ([place]) 12/24/2009  ? Personal history of colonic polyps   ? Pneumonia, organism unspecified(486)   ? Pure hypercholesterolemia   ? Shortness of breath   ? Stroke [facility]) 2005  ? Tobacco use disorder 03/16/2007  ? patient risk factors  ?  Unspecified hypothyroidism 01/07/2010  ? 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 ? ?Tobacco Use: ?Social History  ? ?Tobacco Use  ?Smoking Status Former  ? Packs/day: 1.00  ? Years: 30.00  ? Pack years: 30.00  ? Types: Cigarettes  ? Quit date: 08/22/2020  ? Years since quitting: 1.2  ?Smokeless Tobacco Never  ? ? ?Labs: ?Review Flowsheet   ? ?  ?  Latest Ref Rng & Units 07/12/2016 03/01/2017 10/18/2017 01/24/2018  ?Labs for ITP Cardiac and Pulmonary Rehab  ?Cholestrol 100 - 199 mg/dL 167   159   170   191    ?LDL (calc) 0 - 99 mg/dL 91   85   72   87    ?HDL-C >39 mg/dL 53   49   56   47    ?Trlycerides 0 - 149 mg/dL 117   123   211   283    ?Hemoglobin A1c 4.8 - 5.6 % 6.2   6.0   6.6   6.5    ?Bicarbonate 20.0 - 28.0 mmol/L      ?O2 Saturation %      ? ?  05/11/2019  ?Labs for ITP Cardiac and Pulmonary Rehab  ?Cholestrol   ?LDL (calc)   ?HDL-C   ?Trlycerides   ?Hemoglobin A1c 13.2    ?Bicarbonate 38.6    ?O2 Saturation 96.8    ?  ? ? Multiple values from one day are sorted in reverse-chronological order  ?  ?  ? ? ? ?Pulmonary Assessment Scores: ? Pulmonary Assessment Scores   ? ? [place] Name 08/12/21 1536  ?  ?  ?  ? ADL UCSD  ? SOB Score total 91    ? Rest 1    ? Walk 3    ? Stairs 4    ? Bath 4    ? Dress 4    ? Shop 5    ?  ? CAT Score  ? CAT Score 20    ?  ? mMRC Score  ?  mMRC Score 2    ? ?  ?  ? ?  ?  ?UCSD: ?Self-administered rating of dyspnea associated with activities of daily living (ADLs) ?6-point scale (0 = "not at all" to 5 = "maximal or unable to do because of breathl

## 2021-11-15 ENCOUNTER — Other Ambulatory Visit: Payer: Self-pay

## 2021-11-15 DIAGNOSIS — J8410 Pulmonary fibrosis, unspecified: Secondary | ICD-10-CM

## 2021-11-15 NOTE — Progress Notes (Signed)
Daily Session Note ? ?Patient Details  ?Name: Kirk Robinson ?MRN: [id number] ?Date of Birth: 1955/12/19 ?Referring Provider:   ?Flowsheet Row Pulmonary Rehab from 08/12/2021 in [facility] Cardiac and Pulmonary Rehab  ?Referring Provider Raul Del  ? ?  ? ? ?Encounter Date: 11/15/2021 ? ?Check In: ? Session Check In - 11/15/21 1332   ? ?  ? Check-In  ? Supervising physician immediately available to respond to emergencies See telemetry face sheet for immediately available ER MD   ? Location [facility]-Cardiac & Pulmonary Rehab   ? Staff Present Birdie Sons, MPA, Nino Glow, MS, ASCM CEP, Exercise Physiologist;Joseph [place], [place]   ? Virtual Visit No   ? Medication changes reported     No   ? Fall or balance concerns reported    No   ? Tobacco Cessation No Change   ? Warm-up and Cool-down Performed on first and last piece of equipment   ? Resistance Training Performed Yes   ? VAD Patient? No   ? PAD/SET Patient? No   ?  ? Pain Assessment  ? Currently in Pain? No/denies   ? ?  ?  ? ?  ? ? ? ? ? ?Social History  ? ?Tobacco Use  ?Smoking Status Former  ? Packs/day: 1.00  ? Years: 30.00  ? Pack years: 30.00  ? Types: Cigarettes  ? Quit date: 08/22/2020  ? Years since quitting: 1.2  ?Smokeless Tobacco Never  ? ? ?Goals Met:  ?Independence with exercise equipment ?Exercise tolerated well ?No report of concerns or symptoms today ?Strength training completed today ? ?Goals Unmet:  ?Not Applicable ? ?Comments: Pt able to follow exercise prescription today without complaint.  Will continue to monitor for progression. ? ? ? ?Dr. Emily Filbert is Medical Director for [place].  ?Dr. Ottie Glazier is Medical Director for [facility] Pulmonary Rehabilitation. ?

## 2021-11-24 ENCOUNTER — Encounter: Payer: Medicare HMO | Attending: Specialist

## 2021-11-24 DIAGNOSIS — J8410 Pulmonary fibrosis, unspecified: Secondary | ICD-10-CM | POA: Insufficient documentation

## 2021-11-29 ENCOUNTER — Telehealth: Payer: Self-pay

## 2021-11-29 ENCOUNTER — Other Ambulatory Visit (INDEPENDENT_AMBULATORY_CARE_PROVIDER_SITE_OTHER): Payer: Self-pay | Admitting: Vascular Surgery

## 2021-11-29 DIAGNOSIS — I70219 Atherosclerosis of native arteries of extremities with intermittent claudication, unspecified extremity: Secondary | ICD-10-CM

## 2021-11-29 NOTE — Telephone Encounter (Signed)
Patient has not been to Pulmonary rehab since 11/15/2021. He has stated he has been short of breath, had doctors appointments and is not sure if he wants to resume rehab. Patient is to call back in the next week. Patient verbalizes understanding. ?

## 2021-11-30 ENCOUNTER — Ambulatory Visit (INDEPENDENT_AMBULATORY_CARE_PROVIDER_SITE_OTHER): Payer: Medicare HMO | Admitting: Vascular Surgery

## 2021-11-30 ENCOUNTER — Ambulatory Visit (INDEPENDENT_AMBULATORY_CARE_PROVIDER_SITE_OTHER): Payer: Medicare HMO

## 2021-11-30 DIAGNOSIS — I70219 Atherosclerosis of native arteries of extremities with intermittent claudication, unspecified extremity: Secondary | ICD-10-CM

## 2021-12-01 NOTE — Telephone Encounter (Signed)
Kirk Robinson's wife said his circulation test was good - Dr still wants to do CT of lungs - Kirk Robinson plans to return to class 12/06/21. ?

## 2021-12-02 ENCOUNTER — Encounter: Payer: Self-pay | Admitting: *Deleted

## 2021-12-02 ENCOUNTER — Telehealth: Payer: Self-pay | Admitting: *Deleted

## 2021-12-02 NOTE — Telephone Encounter (Signed)
[name] called to inform staff that Kirk Robinson's CT is in the process of being scheduled. 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 His wife stated she would call with an update and if he can return.  ?

## 2021-12-03 ENCOUNTER — Other Ambulatory Visit: Payer: Self-pay | Admitting: Specialist

## 2021-12-03 DIAGNOSIS — J849 Interstitial pulmonary disease, unspecified: Secondary | ICD-10-CM

## 2021-12-03 DIAGNOSIS — R0609 Other forms of dyspnea: Secondary | ICD-10-CM

## 2021-12-07 NOTE — Progress Notes (Signed)
Kirk Robinson has missed sessions due to having other medical appointments.  Staff will follow up when he returns. ?

## 2021-12-08 ENCOUNTER — Ambulatory Visit
Admission: RE | Admit: 2021-12-08 | Discharge: 2021-12-08 | Disposition: A | Discharge status: Home / Self Care | Payer: Medicare HMO | Source: Ambulatory Visit | Attending: Specialist | Admitting: Specialist

## 2021-12-08 ENCOUNTER — Encounter (INDEPENDENT_AMBULATORY_CARE_PROVIDER_SITE_OTHER): Payer: Self-pay | Admitting: *Deleted

## 2021-12-08 ENCOUNTER — Encounter: Payer: Self-pay | Admitting: *Deleted

## 2021-12-08 DIAGNOSIS — J8410 Pulmonary fibrosis, unspecified: Secondary | ICD-10-CM

## 2021-12-08 DIAGNOSIS — R0609 Other forms of dyspnea: Secondary | ICD-10-CM

## 2021-12-08 DIAGNOSIS — J849 Interstitial pulmonary disease, unspecified: Secondary | ICD-10-CM

## 2021-12-08 NOTE — Progress Notes (Signed)
Pulmonary Individual Treatment Plan ? ?Patient Details  ?Name: Kirk Robinson ?MRN: [id number] ?Date of Birth: February 04, 1956 ?Referring Provider:   ?Flowsheet Row Pulmonary Rehab from 08/12/2021 in [facility] Cardiac and Pulmonary Rehab  ?Referring Provider Raul Del  ? ?  ? ? ?Initial Encounter Date:  ?Flowsheet Row Pulmonary Rehab from 08/12/2021 in [facility] Cardiac and Pulmonary Rehab  ?Date 08/12/21  ? ?  ? ? ?Visit Diagnosis: Pulmonary fibrosis ([place]) ? ?Patient's Home Medications on Admission: ? ?Current Outpatient Medications:  ?  Accu-Chek FastClix Lancets MISC, Apply topically daily., Disp: , Rfl:  ?  albuterol (VENTOLIN HFA) 108 (90 Base) MCG/ACT inhaler, Inhale 2 puffs into the lungs every 6 (six) hours as needed for wheezing or shortness of breath., Disp: 18 g, Rfl: 0 ?  alendronate (FOSAMAX) 70 MG tablet, Take by mouth. (Patient not taking: Reported on 06/11/2020), Disp: , Rfl:  ?  ARIPiprazole (ABILIFY) 2 MG tablet, Take 2 mg by mouth daily., Disp: , Rfl:  ?  ARMOUR THYROID 120 MG tablet, Take 120 mg by mouth every morning., Disp: , Rfl:  ?  ASPIRIN LOW DOSE 81 MG EC tablet, TAKE 1 TABLET BY MOUTH EVERY DAY, Disp: 90 tablet, Rfl: 4 ?  atorvastatin (LIPITOR) 20 MG tablet, Take 1 tablet (20 mg total) by mouth daily at 6 PM., Disp: 90 tablet, Rfl: 3 ?  Blood Glucose Monitoring Suppl (ACCU-CHEK GUIDE) w/Device KIT, See admin instructions., Disp: , Rfl:  ?  BREZTRI AEROSPHERE 160-9-4.8 MCG/ACT AERO, SMARTSIG:2 Inhalation Via Inhaler Twice Daily, Disp: , Rfl:  ?  clobetasol ointment (TEMOVATE) 0.05 %, Apply topically 2 (two) times daily. (Patient not taking: Reported on 07/08/2021), Disp: , Rfl:  ?  clopidogrel (PLAVIX) 75 MG tablet, TAKE 1 TABLET BY MOUTH EVERY DAY, Disp: 90 tablet, Rfl: 3 ?  diazepam (VALIUM) 5 MG tablet, Take 5 mg by mouth 2 (two) times daily. , Disp: , Rfl:  ?  diazepam (VALIUM) 5 MG tablet, Take by mouth. (Patient not taking: Reported on 07/08/2021), Disp: , Rfl:  ?  diltiazem (CARDIZEM CD) 120 MG 24 hr  capsule, Take by mouth daily., Disp: , Rfl:  ?  Docusate Sodium 100 MG capsule, Take 1 tablet by mouth in the morning, at noon, and at bedtime., Disp: , Rfl:  ?  Ensure Max Protein (ENSURE MAX PROTEIN) LIQD, Take 330 mLs (11 oz total) by mouth 2 (two) times daily. (Patient not taking: Reported on 06/11/2020), Disp: 330 mL, Rfl: 0 ?  ESBRIET 267 MG TABS, Take 3 tablets by mouth 3 (three) times daily as needed., Disp: , Rfl:  ?  famotidine (PEPCID) 20 MG tablet, Take by mouth., Disp: , Rfl:  ?  glucose blood test strip, See admin instructions., Disp: , Rfl:  ?  hydrocortisone 2.5 % ointment, Apply topically 2 (two) times daily. (Patient not taking: Reported on 10/19/2021), Disp: , Rfl:  ?  linagliptin (TRADJENTA) 5 MG TABS tablet, Take 1 tablet (5 mg total) by mouth daily. (Patient not taking: Reported on 07/08/2021), Disp: 30 tablet, Rfl: 0 ?  metFORMIN (GLUCOPHAGE) 1000 MG tablet, TAKE 1 TABLET BY MOUTH 2 TIMES DAILY WITH MEAL. (Patient taking differently: Take 1,000 mg by mouth 2 (two) times daily with a meal.), Disp: 180 tablet, Rfl: 0 ?  metoprolol succinate (TOPROL-XL) 25 MG 24 hr tablet, Take by mouth., Disp: , Rfl:  ?  metoprolol succinate (TOPROL-XL) 25 MG 24 hr tablet, Take 1 tablet by mouth daily., Disp: , Rfl:  ?  mirtazapine (REMERON) 15 MG  tablet, Take by mouth., Disp: , Rfl:  ?  mirtazapine (REMERON) 30 MG tablet, Take 30 mg by mouth at bedtime., Disp: , Rfl:  ?  morphine (MS CONTIN) 30 MG 12 hr tablet, SMARTSIG:1 Tablet(s) By Mouth Every 12 Hours, Disp: , Rfl:  ?  naloxone (NARCAN) nasal spray 4 mg/0.1 mL, Place into the nose., Disp: , Rfl:  ?  pantoprazole (PROTONIX) 40 MG tablet, Take by mouth., Disp: , Rfl:  ?  Pirfenidone 267 MG TABS, Take by mouth., Disp: , Rfl:  ?  polyethylene glycol (MIRALAX / GLYCOLAX) 17 g packet, Take 17 g by mouth daily. (Patient not taking: Reported on 10/19/2021), Disp: 30 each, Rfl: 0 ?  polyethylene glycol-electrolytes (NULYTELY) 420 g solution, Prepare according to  package instructions. Starting at 5:00 PM: Drink one 8 oz glass of mixture every 15 minutes until you finish half of the jug. Five hours prior to procedure, drink 8 oz glass of mixture every 15 minutes until it is all gone. Make sure you do not drink anything 4 hours prior to your procedure. (Patient not taking: Reported on 07/08/2021), Disp: 8000 mL, Rfl: 0 ?  predniSONE (DELTASONE) 10 MG tablet, Take 20 mg by mouth daily with breakfast., Disp: , Rfl:  ?  predniSONE (DELTASONE) 10 MG tablet, Take 5 mg by mouth every evening.  (Patient not taking: Reported on 10/19/2021), Disp: , Rfl:  ?  predniSONE (DELTASONE) 10 MG tablet, Take 1 tablet by mouth daily. (Patient not taking: Reported on 07/08/2021), Disp: , Rfl:  ?  rOPINIRole (REQUIP) 0.5 MG tablet, Take by mouth., Disp: , Rfl:  ?  sitaGLIPtin (JANUVIA) 100 MG tablet, Take by mouth., Disp: , Rfl:  ?  tapentadol HCl (NUCYNTA) 75 MG tablet, Take 75 mg by mouth every 4 (four) hours as needed for moderate pain or severe pain.  (Patient not taking: Reported on 06/11/2020), Disp: , Rfl:  ?  thiamine 100 MG tablet, Take 1 tablet (100 mg total) by mouth daily. (Patient not taking: Reported on 06/11/2020), Disp: 30 tablet, Rfl: 0 ?  venlafaxine XR (EFFEXOR-XR) 150 MG 24 hr capsule, Take 1 capsule (150 mg total) by mouth daily with breakfast., Disp: 90 capsule, Rfl: 1 ?  Vitamin D, Ergocalciferol, (DRISDOL) 1.25 MG (50000 UNIT) CAPS capsule, Take by mouth., Disp: , Rfl:  ? ?Past Medical History: ?Past Medical History:  ?Diagnosis Date  ? Alcohol use 03/16/2007  ? patient risk factors  ? Allergic rhinitis, cause unspecified   ? Altered mental status   ? Anxiety state, unspecified   ? Cervicalgia 09/17/2007  ? COPD (chronic obstructive pulmonary disease) ([place])   ? Diabetes mellitus without complication ([place])   ? Diplopia 12/21/2007  ? Esophageal reflux   ? Gastritis 05/13/2009  ? HCAP (healthcare-associated pneumonia) 08/12/2014  ? Hematuria 03/16/2007  ? Hyperthyroidism   ?  graves disease  ? Iodine hypothyroidism   ? Memory loss   ? NSIP (nonspecific interstitial pneumonia) ([place])   ? on azathioprine  ? Osteoporosis, unspecified   ? bone density per [facility] in 2012  ? Other abnormal glucose 04/08/2008  ? Other diseases of lung, not elsewhere classified 01/23/2009  ? s/p Fleming/pulmonology consult; intolerant  to Advair  ? Peripheral vascular disease, unspecified ([place]) 12/24/2009  ? Personal history of colonic polyps   ? Pneumonia, organism unspecified(486)   ? Pure hypercholesterolemia   ? Shortness of breath   ? Stroke [facility]) 2005  ? Tobacco use disorder 03/16/2007  ? patient risk factors  ?  Unspecified hypothyroidism 01/07/2010  ? 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 ? ?Tobacco Use: ?Social History  ? ?Tobacco Use  ?Smoking Status Former  ? Packs/day: 1.00  ? Years: 30.00  ? Pack years: 30.00  ? Types: Cigarettes  ? Quit date: 08/22/2020  ? Years since quitting: 1.2  ?Smokeless Tobacco Never  ? ? ?Labs: ?Review Flowsheet   ? ?  ?  Latest Ref Rng & Units 07/12/2016 03/01/2017 10/18/2017 01/24/2018  ?Labs for ITP Cardiac and Pulmonary Rehab  ?Cholestrol 100 - 199 mg/dL 167   159   170   191    ?LDL (calc) 0 - 99 mg/dL 91   85   72   87    ?HDL-C >39 mg/dL 53   49   56   47    ?Trlycerides 0 - 149 mg/dL 117   123   211   283    ?Hemoglobin A1c 4.8 - 5.6 % 6.2   6.0   6.6   6.5    ?Bicarbonate 20.0 - 28.0 mmol/L      ?O2 Saturation %      ? ?  05/11/2019  ?Labs for ITP Cardiac and Pulmonary Rehab  ?Cholestrol   ?LDL (calc)   ?HDL-C   ?Trlycerides   ?Hemoglobin A1c 13.2    ?Bicarbonate 38.6    ?O2 Saturation 96.8    ?  ? ? Multiple values from one day are sorted in reverse-chronological order  ?  ?  ? ? ? ?Pulmonary Assessment Scores: ? Pulmonary Assessment Scores   ? ? [place] Name 08/12/21 1536  ?  ?  ?  ? ADL UCSD  ? SOB Score total 91    ? Rest 1    ? Walk 3    ? Stairs 4    ? Bath 4    ? Dress 4    ? Shop 5    ?  ? CAT Score  ? CAT Score 20    ?  ? mMRC Score  ?  mMRC Score 2    ? ?  ?  ? ?  ?  ?UCSD: ?Self-administered rating of dyspnea associated with activities of daily living (ADLs) ?6-point scale (0 = "not at all" to 5 = "maximal or unable to do because of breathl

## 2021-12-16 ENCOUNTER — Telehealth: Payer: Self-pay

## 2021-12-16 NOTE — Telephone Encounter (Signed)
Attempted to call patient to follow up on returning the Pulmonary Rehab. Family member picked up phone and stated he was not there and was not able to take a message. Informed them we will call back another day to try. ?

## 2021-12-20 ENCOUNTER — Encounter: Payer: Medicare HMO | Attending: Specialist

## 2021-12-20 DIAGNOSIS — J8410 Pulmonary fibrosis, unspecified: Secondary | ICD-10-CM

## 2021-12-20 DIAGNOSIS — Z4889 Encounter for other specified surgical aftercare: Secondary | ICD-10-CM | POA: Diagnosis not present

## 2021-12-20 NOTE — Progress Notes (Signed)
Daily Session Note ? ?Patient Details  ?Name: Dahlton Bounds ?MRN: [id number] ?Date of Birth: 1955-11-18 ?Referring Provider:   ?Flowsheet Row Pulmonary Rehab from 08/12/2021 in [facility] Cardiac and Pulmonary Rehab  ?Referring Provider Raul Del  ? ?  ? ? ?Encounter Date: 12/20/2021 ? ?Check In: ? Session Check In - 12/20/21 1333   ? ?  ? Check-In  ? Supervising physician immediately available to respond to emergencies See telemetry face sheet for immediately available ER MD   ? Location [facility]-Cardiac & Pulmonary Rehab   ? Staff Present Birdie Sons, MPA, RN;Joseph [place], RCP,RRT,BSRT;Kara [place], MS, ASCM CEP, Exercise Physiologist   ? Virtual Visit No   ? Medication changes reported     No   ? Fall or balance concerns reported    No   ? Tobacco Cessation No Change   ? Warm-up and Cool-down Performed on first and last piece of equipment   ? Resistance Training Performed Yes   ? VAD Patient? No   ? PAD/SET Patient? No   ?  ? Pain Assessment  ? Currently in Pain? No/denies   ? ?  ?  ? ?  ? ? ? ? ? ?Social History  ? ?Tobacco Use  ?Smoking Status Former  ? Packs/day: 1.00  ? Years: 30.00  ? Pack years: 30.00  ? Types: Cigarettes  ? Quit date: 08/22/2020  ? Years since quitting: 1.3  ?Smokeless Tobacco Never  ? ? ?Goals Met:  ?Independence with exercise equipment ?Exercise tolerated well ?No report of concerns or symptoms today ?Strength training completed today ? ?Goals Unmet:  ?Not Applicable ? ?Comments: Pt able to follow exercise prescription today without complaint.  Will continue to monitor for progression. ? ? ? ?Dr. Emily Filbert is Medical Director for [place].  ?Dr. Ottie Glazier is Medical Director for [facility] Pulmonary Rehabilitation. ?

## 2021-12-22 DIAGNOSIS — J8410 Pulmonary fibrosis, unspecified: Secondary | ICD-10-CM

## 2021-12-22 NOTE — Progress Notes (Signed)
Daily Session Note ? ?Patient Details  ?Name: Joeziah Stakes ?MRN: [id number] ?Date of Birth: 05-29-1956 ?Referring Provider:   ?Flowsheet Row Pulmonary Rehab from 08/12/2021 in [facility] Cardiac and Pulmonary Rehab  ?Referring Provider Raul Del  ? ?  ? ? ?Encounter Date: 12/22/2021 ? ?Check In: ? Session Check In - 12/22/21 1336   ? ?  ? Check-In  ? Supervising physician immediately available to respond to emergencies See telemetry face sheet for immediately available ER MD   ? Location [facility]-Cardiac & Pulmonary Rehab   ? Staff Present Birdie Sons, MPA, RN;Joseph [place], RCP,RRT,BSRT;Melissa [place], RDN, LDN   ? Virtual Visit No   ? Medication changes reported     No   ? Fall or balance concerns reported    No   ? Tobacco Cessation No Change   ? Warm-up and Cool-down Performed on first and last piece of equipment   ? Resistance Training Performed Yes   ? VAD Patient? No   ? PAD/SET Patient? No   ?  ? Pain Assessment  ? Currently in Pain? No/denies   ? ?  ?  ? ?  ? ? ? ? ? ?Social History  ? ?Tobacco Use  ?Smoking Status Former  ? Packs/day: 1.00  ? Years: 30.00  ? Pack years: 30.00  ? Types: Cigarettes  ? Quit date: 08/22/2020  ? Years since quitting: 1.3  ?Smokeless Tobacco Never  ? ? ?Goals Met:  ?Independence with exercise equipment ?Exercise tolerated well ?No report of concerns or symptoms today ?Strength training completed today ? ?Goals Unmet:  ?Not Applicable ? ?Comments: Pt able to follow exercise prescription today without complaint.  Will continue to monitor for progression. ? ? ? ?Dr. Emily Filbert is Medical Director for [place].  ?Dr. Ottie Glazier is Medical Director for [facility] Pulmonary Rehabilitation. ?

## 2021-12-27 DIAGNOSIS — J8410 Pulmonary fibrosis, unspecified: Secondary | ICD-10-CM

## 2021-12-27 NOTE — Progress Notes (Signed)
Daily Session Note ? ?Patient Details  ?Name: Kirk Robinson ?MRN: [id number] ?Date of Birth: Dec 29, 1955 ?Referring Provider:   ?Flowsheet Row Pulmonary Rehab from 08/12/2021 in [facility] Cardiac and Pulmonary Rehab  ?Referring Provider Raul Del  ? ?  ? ? ?Encounter Date: 12/27/2021 ? ?Check In: ? Session Check In - 12/27/21 1343   ? ?  ? Check-In  ? Supervising physician immediately available to respond to emergencies See telemetry face sheet for immediately available ER MD   ? Location [facility]-Cardiac & Pulmonary Rehab   ? Staff Present Birdie Sons, MPA, RN;Melissa Cundi[place], RDN, LDN;Jessica Dilworth, MA, RCEP, CCRP, Marylynn Pearson, MS, ASCM CEP, Exercise Physiologist   ? Virtual Visit No   ? Medication changes reported     No   ? Fall or balance concerns reported    No   ? Tobacco Cessation No Change   ? Warm-up and Cool-down Performed on first and last piece of equipment   ? Resistance Training Performed Yes   ? VAD Patient? No   ? PAD/SET Patient? No   ?  ? Pain Assessment  ? Currently in Pain? No/denies   ? ?  ?  ? ?  ? ? ? ? ? ?Social History  ? ?Tobacco Use  ?Smoking Status Former  ? Packs/day: 1.00  ? Years: 30.00  ? Pack years: 30.00  ? Types: Cigarettes  ? Quit date: 08/22/2020  ? Years since quitting: 1.3  ?Smokeless Tobacco Never  ? ? ?Goals Met:  ?Independence with exercise equipment ?Exercise tolerated well ?No report of concerns or symptoms today ?Strength training completed today ? ?Goals Unmet:  ?Not Applicable ? ?Comments: Pt able to follow exercise prescription today without complaint.  Will continue to monitor for progression. ? ? ? ?Dr. Emily Filbert is Medical Director for [place].  ?Dr. Ottie Glazier is Medical Director for [facility] Pulmonary Rehabilitation. ?

## 2021-12-29 DIAGNOSIS — J8410 Pulmonary fibrosis, unspecified: Secondary | ICD-10-CM

## 2021-12-29 NOTE — Progress Notes (Signed)
Daily Session Note ? ?Patient Details  ?Name: Kirk Robinson ?MRN: [id number] ?Date of Birth: [birth year] ?Referring Provider:   ?Flowsheet Row Pulmonary Rehab from 08/12/2021 in [facility] Cardiac and Pulmonary Rehab  ?Referring Provider Raul Del  ? ?  ? ? ?Encounter Date: 12/29/2021 ? ?Check In: ? Session Check In - 12/29/21 1333   ? ?  ? Check-In  ? Supervising physician immediately available to respond to emergencies See telemetry face sheet for immediately available ER MD   ? Location [facility]-Cardiac & Pulmonary Rehab   ? Staff Present Birdie Sons, MPA, RN;Melissa [place], RDN, LDN;Joseph [employer], RCP,RRT,BSRT   ? Virtual Visit No   ? Medication changes reported     No   ? Fall or balance concerns reported    No   ? Tobacco Cessation No Change   ? Warm-up and Cool-down Performed on first and last piece of equipment   ? Resistance Training Performed Yes   ? VAD Patient? No   ? PAD/SET Patient? No   ?  ? Pain Assessment  ? Currently in Pain? No/denies   ? ?  ?  ? ?  ? ? ? ? ? ?Social History  ? ?Tobacco Use  ?Smoking Status Former  ? Packs/day: 1.00  ? Years: 30.00  ? Pack years: 30.00  ? Types: Cigarettes  ? Quit date: 08/22/2020  ? Years since quitting: 1.3  ?Smokeless Tobacco Never  ? ? ?Goals Met:  ?Independence with exercise equipment ?Exercise tolerated well ?No report of concerns or symptoms today ?Strength training completed today ? ?Goals Unmet:  ?Not Applicable ? ?Comments: Pt able to follow exercise prescription today without complaint.  Will continue to monitor for progression. ? ? ? ?Dr. Emily Filbert is Medical Director for [place].  ?Dr. Ottie Glazier is Medical Director for [facility] Pulmonary Rehabilitation. ?

## 2022-01-03 DIAGNOSIS — J8410 Pulmonary fibrosis, unspecified: Secondary | ICD-10-CM | POA: Diagnosis not present

## 2022-01-03 NOTE — Progress Notes (Signed)
Daily Session Note ? ?Patient Details  ?Name: Kirk Robinson ?MRN: [id number] ?Date of Birth: 07/30/1956 ?Referring Provider:   ?Flowsheet Row Pulmonary Rehab from 08/12/2021 in [facility] Cardiac and Pulmonary Rehab  ?Referring Provider [place]  ? ?  ? ? ?Encounter Date: 01/03/2022 ? ?Check In: ? Session Check In - 01/03/22 1342   ? ?  ? Check-In  ? Supervising physician immediately available to respond to emergencies See telemetry face sheet for immediately available ER MD   ? Location [facility]-Cardiac & Pulmonary Rehab   ? Staff Present  , MPA, RN;Joseph Hood, RCP,RRT,BSRT;Kara Langdon, MS, ASCM CEP, Exercise Physiologist   ? Virtual Visit No   ? Medication changes reported     No   ? Fall or balance concerns reported    No   ? Tobacco Cessation No Change   ? Warm-up and Cool-down Performed on first and last piece of equipment   ? Resistance Training Performed Yes   ? VAD Patient? No   ? PAD/SET Patient? No   ?  ? Pain Assessment  ? Currently in Pain? No/denies   ? ?  ?  ? ?  ? ? ? ? ? ?Social History  ? ?Tobacco Use  ?Smoking Status Former  ? Packs/day: 1.00  ? Years: 30.00  ? Pack years: 30.00  ? Types: Cigarettes  ? Quit date: 08/22/2020  ? Years since quitting: 1.3  ?Smokeless Tobacco Never  ? ? ?Goals Met:  ?Independence with exercise equipment ?Exercise tolerated well ?No report of concerns or symptoms today ?Strength training completed today ? ?Goals Unmet:  ?Not Applicable ? ?Comments: Pt able to follow exercise prescription today without complaint.  Will continue to monitor for progression. ? ? ? ?Dr. Mark Miller is Medical Director for [facility].  ?Dr. Fuad Aleskerov is Medical Director for [facility]. ?

## 2022-01-05 ENCOUNTER — Encounter: Payer: Self-pay | Admitting: *Deleted

## 2022-01-05 DIAGNOSIS — J8410 Pulmonary fibrosis, unspecified: Secondary | ICD-10-CM | POA: Diagnosis not present

## 2022-01-05 NOTE — Progress Notes (Signed)
Pulmonary Individual Treatment Plan ? ?Patient Details  ?Name: Kirk Robinson ?MRN: [id number] ?Date of Birth: February 04, 1956 ?Referring Provider:   ?Flowsheet Row Pulmonary Rehab from 08/12/2021 in [facility] Cardiac and Pulmonary Rehab  ?Referring Provider Raul Del  ? ?  ? ? ?Initial Encounter Date:  ?Flowsheet Row Pulmonary Rehab from 08/12/2021 in [facility] Cardiac and Pulmonary Rehab  ?Date 08/12/21  ? ?  ? ? ?Visit Diagnosis: Pulmonary fibrosis ([place]) ? ?Patient's Home Medications on Admission: ? ?Current Outpatient Medications:  ?  Accu-Chek FastClix Lancets MISC, Apply topically daily., Disp: , Rfl:  ?  albuterol (VENTOLIN HFA) 108 (90 Base) MCG/ACT inhaler, Inhale 2 puffs into the lungs every 6 (six) hours as needed for wheezing or shortness of breath., Disp: 18 g, Rfl: 0 ?  alendronate (FOSAMAX) 70 MG tablet, Take by mouth. (Patient not taking: Reported on 06/11/2020), Disp: , Rfl:  ?  ARIPiprazole (ABILIFY) 2 MG tablet, Take 2 mg by mouth daily., Disp: , Rfl:  ?  ARMOUR THYROID 120 MG tablet, Take 120 mg by mouth every morning., Disp: , Rfl:  ?  ASPIRIN LOW DOSE 81 MG EC tablet, TAKE 1 TABLET BY MOUTH EVERY DAY, Disp: 90 tablet, Rfl: 4 ?  atorvastatin (LIPITOR) 20 MG tablet, Take 1 tablet (20 mg total) by mouth daily at 6 PM., Disp: 90 tablet, Rfl: 3 ?  Blood Glucose Monitoring Suppl (ACCU-CHEK GUIDE) w/Device KIT, See admin instructions., Disp: , Rfl:  ?  BREZTRI AEROSPHERE 160-9-4.8 MCG/ACT AERO, SMARTSIG:2 Inhalation Via Inhaler Twice Daily, Disp: , Rfl:  ?  clobetasol ointment (TEMOVATE) 0.05 %, Apply topically 2 (two) times daily. (Patient not taking: Reported on 07/08/2021), Disp: , Rfl:  ?  clopidogrel (PLAVIX) 75 MG tablet, TAKE 1 TABLET BY MOUTH EVERY DAY, Disp: 90 tablet, Rfl: 3 ?  diazepam (VALIUM) 5 MG tablet, Take 5 mg by mouth 2 (two) times daily. , Disp: , Rfl:  ?  diazepam (VALIUM) 5 MG tablet, Take by mouth. (Patient not taking: Reported on 07/08/2021), Disp: , Rfl:  ?  diltiazem (CARDIZEM CD) 120 MG 24 hr  capsule, Take by mouth daily., Disp: , Rfl:  ?  Docusate Sodium 100 MG capsule, Take 1 tablet by mouth in the morning, at noon, and at bedtime., Disp: , Rfl:  ?  Ensure Max Protein (ENSURE MAX PROTEIN) LIQD, Take 330 mLs (11 oz total) by mouth 2 (two) times daily. (Patient not taking: Reported on 06/11/2020), Disp: 330 mL, Rfl: 0 ?  ESBRIET 267 MG TABS, Take 3 tablets by mouth 3 (three) times daily as needed., Disp: , Rfl:  ?  famotidine (PEPCID) 20 MG tablet, Take by mouth., Disp: , Rfl:  ?  glucose blood test strip, See admin instructions., Disp: , Rfl:  ?  hydrocortisone 2.5 % ointment, Apply topically 2 (two) times daily. (Patient not taking: Reported on 10/19/2021), Disp: , Rfl:  ?  linagliptin (TRADJENTA) 5 MG TABS tablet, Take 1 tablet (5 mg total) by mouth daily. (Patient not taking: Reported on 07/08/2021), Disp: 30 tablet, Rfl: 0 ?  metFORMIN (GLUCOPHAGE) 1000 MG tablet, TAKE 1 TABLET BY MOUTH 2 TIMES DAILY WITH MEAL. (Patient taking differently: Take 1,000 mg by mouth 2 (two) times daily with a meal.), Disp: 180 tablet, Rfl: 0 ?  metoprolol succinate (TOPROL-XL) 25 MG 24 hr tablet, Take by mouth., Disp: , Rfl:  ?  metoprolol succinate (TOPROL-XL) 25 MG 24 hr tablet, Take 1 tablet by mouth daily., Disp: , Rfl:  ?  mirtazapine (REMERON) 15 MG  tablet, Take by mouth., Disp: , Rfl:  ?  mirtazapine (REMERON) 30 MG tablet, Take 30 mg by mouth at bedtime., Disp: , Rfl:  ?  morphine (MS CONTIN) 30 MG 12 hr tablet, SMARTSIG:1 Tablet(s) By Mouth Every 12 Hours, Disp: , Rfl:  ?  naloxone (NARCAN) nasal spray 4 mg/0.1 mL, Place into the nose., Disp: , Rfl:  ?  pantoprazole (PROTONIX) 40 MG tablet, Take by mouth., Disp: , Rfl:  ?  Pirfenidone 267 MG TABS, Take by mouth., Disp: , Rfl:  ?  polyethylene glycol (MIRALAX / GLYCOLAX) 17 g packet, Take 17 g by mouth daily. (Patient not taking: Reported on 10/19/2021), Disp: 30 each, Rfl: 0 ?  polyethylene glycol-electrolytes (NULYTELY) 420 g solution, Prepare according to  package instructions. Starting at 5:00 PM: Drink one 8 oz glass of mixture every 15 minutes until you finish half of the jug. Five hours prior to procedure, drink 8 oz glass of mixture every 15 minutes until it is all gone. Make sure you do not drink anything 4 hours prior to your procedure. (Patient not taking: Reported on 07/08/2021), Disp: 8000 mL, Rfl: 0 ?  predniSONE (DELTASONE) 10 MG tablet, Take 20 mg by mouth daily with breakfast., Disp: , Rfl:  ?  predniSONE (DELTASONE) 10 MG tablet, Take 5 mg by mouth every evening.  (Patient not taking: Reported on 10/19/2021), Disp: , Rfl:  ?  predniSONE (DELTASONE) 10 MG tablet, Take 1 tablet by mouth daily. (Patient not taking: Reported on 07/08/2021), Disp: , Rfl:  ?  rOPINIRole (REQUIP) 0.5 MG tablet, Take by mouth., Disp: , Rfl:  ?  sitaGLIPtin (JANUVIA) 100 MG tablet, Take by mouth., Disp: , Rfl:  ?  tapentadol HCl (NUCYNTA) 75 MG tablet, Take 75 mg by mouth every 4 (four) hours as needed for moderate pain or severe pain.  (Patient not taking: Reported on 06/11/2020), Disp: , Rfl:  ?  thiamine 100 MG tablet, Take 1 tablet (100 mg total) by mouth daily. (Patient not taking: Reported on 06/11/2020), Disp: 30 tablet, Rfl: 0 ?  venlafaxine XR (EFFEXOR-XR) 150 MG 24 hr capsule, Take 1 capsule (150 mg total) by mouth daily with breakfast., Disp: 90 capsule, Rfl: 1 ?  Vitamin D, Ergocalciferol, (DRISDOL) 1.25 MG (50000 UNIT) CAPS capsule, Take by mouth., Disp: , Rfl:  ? ?Past Medical History: ?Past Medical History:  ?Diagnosis Date  ? Alcohol use 03/16/2007  ? patient risk factors  ? Allergic rhinitis, cause unspecified   ? Altered mental status   ? Anxiety state, unspecified   ? Cervicalgia 09/17/2007  ? COPD (chronic obstructive pulmonary disease) ([place])   ? Diabetes mellitus without complication ([place])   ? Diplopia 12/21/2007  ? Esophageal reflux   ? Gastritis 05/13/2009  ? HCAP (healthcare-associated pneumonia) 08/12/2014  ? Hematuria 03/16/2007  ? Hyperthyroidism   ?  graves disease  ? Iodine hypothyroidism   ? Memory loss   ? NSIP (nonspecific interstitial pneumonia) ([place])   ? on azathioprine  ? Osteoporosis, unspecified   ? bone density per [facility] in 2012  ? Other abnormal glucose 04/08/2008  ? Other diseases of lung, not elsewhere classified 01/23/2009  ? s/p [place]/pulmonology consult; intolerant  to Advair  ? Peripheral vascular disease, unspecified ([place]) 12/24/2009  ? Personal history of colonic polyps   ? Pneumonia, organism unspecified(486)   ? Pure hypercholesterolemia   ? Shortness of breath   ? Stroke [facility]) 2005  ? Tobacco use disorder 03/16/2007  ? patient risk factors  ?  Unspecified hypothyroidism 01/07/2010  ? 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 ? ?Tobacco Use: ?Social History  ? ?Tobacco Use  ?Smoking Status Former  ? Packs/day: 1.00  ? Years: 30.00  ? Pack years: 30.00  ? Types: Cigarettes  ? Quit date: 08/22/2020  ? Years since quitting: 1.3  ?Smokeless Tobacco Never  ? ? ?Labs: ?Review Flowsheet   ? ?  ?  Latest Ref Rng & Units 07/12/2016 03/01/2017 10/18/2017 01/24/2018  ?Labs for [facility]P Cardiac and Pulmonary Rehab  ?Cholestrol 100 - 199 mg/dL 167   159   170   191    ?LDL (calc) 0 - 99 mg/dL 91   85   72   87    ?HDL-C >39 mg/dL 53   49   56   47    ?Trlycerides 0 - 149 mg/dL 117   123   211   283    ?Hemoglobin A1c 4.8 - 5.6 % 6.2   6.0   6.6   6.5    ?Bicarbonate 20.0 - 28.0 mmol/L      ?O2 Saturation %      ? ?  05/11/2019  ?Labs for [facility]P Cardiac and Pulmonary Rehab  ?Cholestrol   ?LDL (calc)   ?HDL-C   ?Trlycerides   ?Hemoglobin A1c 13.2    ?Bicarbonate 38.6    ?O2 Saturation 96.8    ?  ?  ?  ? ? ? ?Pulmonary Assessment Scores: ? Pulmonary Assessment Scores   ? ? [place] Name 08/12/21 1536  ?  ?  ?  ? ADL UCSD  ? SOB Score total 91    ? Rest 1    ? Walk 3    ? Stairs 4    ? Bath 4    ? Dress 4    ? Shop 5    ?  ? CAT Score  ? CAT Score 20    ?  ? mMRC Score  ? mMRC Score 2    ? ?  ?  ? ?  ?  ?UCSD: ?Self-administered rating of dyspnea  associated with activities of daily living (ADLs) ?6-point scale (0 = "not at all" to 5 = "maximal or unable to do because of breathlessness")  ?Scoring Scores range from 0 to 120.  Minimally important differe

## 2022-01-05 NOTE — Progress Notes (Signed)
Daily Session Note ? ?Patient Details  ?Name: Kirk Robinson ?MRN: [id number] ?Date of Birth: 06/26/1956 ?Referring Provider:   ?Flowsheet Row Pulmonary Rehab from 08/12/2021 in [facility] Cardiac and Pulmonary Rehab  ?Referring Provider [place]  ? ?  ? ? ?Encounter Date: 01/05/2022 ? ?Check In: ? Session Check In - 01/05/22 1327   ? ?  ? Check-In  ? Supervising physician immediately available to respond to emergencies See telemetry face sheet for immediately available ER MD   ? Location [facility]-Cardiac & Pulmonary Rehab   ? Staff Present  , MPA, RN;Melissa [place]ai[place], RDN, LDN;Joseph [place], RCP,RRT,BSRT   ? Virtual Visit No   ? Medication changes reported     No   ? Fall or balance concerns reported    No   ? Tobacco Cessation No Change   ? Warm-up and Cool-down Performed on first and last piece of equipment   ? Resistance Training Performed Yes   ? VAD Patient? No   ? PAD/SET Patient? No   ?  ? Pain Assessment  ? Currently in Pain? No/denies   ? ?  ?  ? ?  ? ? ? ? ? ?Social History  ? ?Tobacco Use  ?Smoking Status Former  ? Packs/day: 1.00  ? Years: 30.00  ? Pack years: 30.00  ? Types: Cigarettes  ? Quit date: 08/22/2020  ? Years since quitting: 1.3  ?Smokeless Tobacco Never  ? ? ?Goals Met:  ?Independence with exercise equipment ?Exercise tolerated well ?No report of concerns or symptoms today ?Strength training completed today ? ?Goals Unmet:  ?Not Applicable ? ?Comments: Pt able to follow exercise prescription today without complaint.  Will continue to monitor for progression. ? ? ? ?Dr. Mark Miller is Medical Director for [facility].  ?Dr. Fuad Aleskerov is Medical Director for [facility]. ?

## 2022-01-10 DIAGNOSIS — J8410 Pulmonary fibrosis, unspecified: Secondary | ICD-10-CM

## 2022-01-10 NOTE — Progress Notes (Signed)
Daily Session Note  Patient Details  Name: Kirk Robinson MRN: [id number] Date of Birth: 04-Jun-1956 Referring Provider:   Flowsheet Row Pulmonary Rehab from 08/12/2021 in [facility] Cardiac and Pulmonary Rehab  Referring Provider Raul Del       Encounter Date: 01/10/2022  Check In:  Session Check In - 01/10/22 1339       Check-In   Supervising physician immediately available to respond to emergencies See telemetry face sheet for immediately available ER MD    Location [facility]-Cardiac & Pulmonary Rehab    Staff Present Birdie Sons, MPA, Nino Glow, MS, ASCM CEP, Exercise Physiologist;Joseph Tessie Fass, [place]    Virtual Visit No    Medication changes reported     No    Fall or balance concerns reported    No    Tobacco Cessation No Change    Warm-up and Cool-down Performed on first and last piece of equipment    Resistance Training Performed Yes    VAD Patient? No    PAD/SET Patient? No      Pain Assessment   Currently in Pain? No/denies                Social History   Tobacco Use  Smoking Status Former   Packs/day: 1.00   Years: 30.00   Pack years: 30.00   Types: Cigarettes   Quit date: 08/22/2020   Years since quitting: 1.3  Smokeless Tobacco Never    Goals Met:  Independence with exercise equipment Exercise tolerated well No report of concerns or symptoms today Strength training completed today  Goals Unmet:  Not Applicable  Comments: Pt able to follow exercise prescription today without complaint.  Will continue to monitor for progression.    Dr. Emily Robinson is Medical Director for [place].  Dr. Ottie Robinson is Medical Director for [facility] Pulmonary Rehabilitation.

## 2022-01-12 DIAGNOSIS — J8410 Pulmonary fibrosis, unspecified: Secondary | ICD-10-CM | POA: Diagnosis not present

## 2022-01-12 NOTE — Progress Notes (Signed)
Daily Session Note  Patient Details  Name: Kirk Robinson MRN: [id number] Date of Birth: 06/16/1956 Referring Provider:   Flowsheet Row Pulmonary Rehab from 08/12/2021 in [facility] Cardiac and Pulmonary Rehab  Referring Provider Raul Del       Encounter Date: 01/12/2022  Check In:  Session Check In - 01/12/22 1337       Check-In   Supervising physician immediately available to respond to emergencies See telemetry face sheet for immediately available ER MD    Location [facility]-Cardiac & Pulmonary Rehab    Staff Present Birdie Sons, MPA, Nino Glow, MS, ASCM CEP, Exercise Physiologist;Joseph Tessie Fass, [place]    Virtual Visit No    Medication changes reported     No    Fall or balance concerns reported    No    Tobacco Cessation No Change    Warm-up and Cool-down Performed on first and last piece of equipment    Resistance Training Performed Yes    VAD Patient? No    PAD/SET Patient? No      Pain Assessment   Currently in Pain? No/denies                Social History   Tobacco Use  Smoking Status Former   Packs/day: 1.00   Years: 30.00   Pack years: 30.00   Types: Cigarettes   Quit date: 08/22/2020   Years since quitting: 1.3  Smokeless Tobacco Never    Goals Met:  Independence with exercise equipment Exercise tolerated well No report of concerns or symptoms today Strength training completed today  Goals Unmet:  Not Applicable  Comments: Pt able to follow exercise prescription today without complaint.  Will continue to monitor for progression.    Dr. Emily Filbert is Medical Director for [place].  Dr. Ottie Glazier is Medical Director for [facility] Pulmonary Rehabilitation.

## 2022-01-19 ENCOUNTER — Encounter: Payer: Medicare HMO | Admitting: *Deleted

## 2022-01-19 DIAGNOSIS — J8410 Pulmonary fibrosis, unspecified: Secondary | ICD-10-CM

## 2022-01-19 NOTE — Progress Notes (Signed)
Daily Session Note  Patient Details  Name: Kirk Robinson MRN: [id number] Date of Birth: 1956-03-05 Referring Provider:   Flowsheet Row Pulmonary Rehab from 08/12/2021 in [facility] Cardiac and Pulmonary Rehab  Referring Provider Raul Del       Encounter Date: 01/19/2022  Check In:  Session Check In - 01/19/22 1347       Check-In   Supervising physician immediately available to respond to emergencies See telemetry face sheet for immediately available ER MD    Location [facility]-Cardiac & Pulmonary Rehab    Staff Present Nyoka Cowden, RN, BSN, Lauretta Grill, RCP,RRT,BSRT;Kara [place], MS, ASCM CEP, Exercise Physiologist    Virtual Visit No    Medication changes reported     No    Fall or balance concerns reported    No    Tobacco Cessation No Change    Warm-up and Cool-down Performed on first and last piece of equipment    Resistance Training Performed No    VAD Patient? No    PAD/SET Patient? No      Pain Assessment   Currently in Pain? No/denies                Social History   Tobacco Use  Smoking Status Former   Packs/day: 1.00   Years: 30.00   Pack years: 30.00   Types: Cigarettes   Quit date: 08/22/2020   Years since quitting: 1.4  Smokeless Tobacco Never    Goals Met:  Independence with exercise equipment Exercise tolerated well No report of concerns or symptoms today  Goals Unmet:  Not Applicable  Comments: Pt able to follow exercise prescription today without complaint.  Will continue to monitor for progression.    Dr. Emily Filbert is Medical Director for [place].  Dr. Ottie Glazier is Medical Director for [facility] Pulmonary Rehabilitation.

## 2022-01-24 ENCOUNTER — Encounter: Payer: Medicare HMO | Attending: Specialist

## 2022-01-24 VITALS — Ht 69.0 in | Wt 166.0 lb

## 2022-01-24 DIAGNOSIS — J8410 Pulmonary fibrosis, unspecified: Secondary | ICD-10-CM | POA: Diagnosis present

## 2022-01-24 NOTE — Progress Notes (Signed)
Daily Session Note  Patient Details  Name: Kirk Robinson MRN: [id number] Date of Birth: 09/19/1955 Referring Provider:   Flowsheet Row Pulmonary Rehab from 08/12/2021 in [facility] Cardiac and Pulmonary Rehab  Referring Provider Raul Del       Encounter Date: 01/24/2022  Check In:  Session Check In - 01/24/22 1332       Check-In   Supervising physician immediately available to respond to emergencies See telemetry face sheet for immediately available ER MD    Location [facility]-Cardiac & Pulmonary Rehab    Staff Present Birdie Sons, MPA, Nino Glow, MS, ASCM CEP, Exercise Physiologist;Joseph Tessie Fass, [place]    Virtual Visit No    Medication changes reported     No    Fall or balance concerns reported    No    Tobacco Cessation No Change    Warm-up and Cool-down Performed on first and last piece of equipment    Resistance Training Performed Yes    VAD Patient? No    PAD/SET Patient? No      Pain Assessment   Currently in Pain? No/denies                Social History   Tobacco Use  Smoking Status Former   Packs/day: 1.00   Years: 30.00   Pack years: 30.00   Types: Cigarettes   Quit date: 08/22/2020   Years since quitting: 1.4  Smokeless Tobacco Never    Goals Met:  Independence with exercise equipment Exercise tolerated well No report of concerns or symptoms today Strength training completed today  Goals Unmet:  Not Applicable  Comments: Pt able to follow exercise prescription today without complaint.  Will continue to monitor for progression.   [place] Name 08/12/21 1521 01/24/22 1405       6 Minute Walk   Phase Initial Discharge    Distance 500 feet 805 feet    Distance % Change -- 61 %    Distance Feet Change -- 305 ft    Walk Time 4.5 minutes 5.23 minutes    # of Rest Breaks 1 1  46 sec    MPH 1.3 1.75    METS 2.2 2.73    RPE 15 13    Perceived Dyspnea  2 1    VO2 Peak 7.7 9.56    Symptoms No No    Resting HR 86 bpm 86  bpm    Resting BP 118/62 104/58    Resting Oxygen Saturation  96 % 95 %    Exercise Oxygen Saturation  during 6 min walk 84 % 81 %    Max Ex. HR 118 bpm 122 bpm    Max Ex. BP 130/64 118/72    2 Minute Post BP 110/62 --      Interval HR   1 Minute HR 106 104    2 Minute HR 114 111    3 Minute HR 115 122    4 Minute HR 118 107    5 Minute HR 101 113    6 Minute HR 103 117    2 Minute Post HR -- 103    Interval Heart Rate? Yes Yes      Interval Oxygen   Interval Oxygen? Yes Yes    Baseline Oxygen Saturation % 96 % 95 %    1 Minute Oxygen Saturation % 89 % 96 %    1 Minute Liters of Oxygen 3 L  pulsed 3 L  pulsed    2 Minute Oxygen Saturation % 88 % 87 %    2 Minute Liters of Oxygen 3 L  pulsed 3 L    3 Minute Oxygen Saturation % 86 % 81 %    3 Minute Liters of Oxygen 3 L  pulsed 3 L    4 Minute Oxygen Saturation % 84 %  stopped to rest - fatigue 87 %    4 Minute Liters of Oxygen 3 L  pulsed 3 L    5 Minute Oxygen Saturation % 85 % 87 %    5 Minute Liters of Oxygen 3 L  pulsed 3 L    6 Minute Oxygen Saturation % 90 % 85 %    6 Minute Liters of Oxygen 3 L  pulsed 3 L    2 Minute Post Oxygen Saturation % 91 % 92 %    2 Minute Post Liters of Oxygen 3 L  pulsed 3 L              Dr. Emily Filbert is Medical Director for [place].  Dr. Ottie Glazier is Medical Director for [facility] Pulmonary Rehabilitation.

## 2022-01-26 ENCOUNTER — Encounter: Payer: Medicare HMO | Admitting: *Deleted

## 2022-01-26 DIAGNOSIS — J8410 Pulmonary fibrosis, unspecified: Secondary | ICD-10-CM | POA: Diagnosis not present

## 2022-01-26 NOTE — Progress Notes (Signed)
Daily Session Note  Patient Details  Name: Kirk Robinson MRN: [id number] Date of Birth: 1956/06/22 Referring Provider:   Flowsheet Row Pulmonary Rehab from 08/12/2021 in [facility]  Referring Provider Raul Del       Encounter Date: 01/26/2022  Check In:  Session Check In - 01/26/22 1400       Check-In   Supervising physician immediately available to respond to emergencies See telemetry face sheet for immediately available ER MD    Location [facility]-Cardiac & Pulmonary Rehab    Staff Present Heath Lark, RN, BSN, CCRP;Kelly Bollinger, MPA, RN;Joseph Tenkiller, [place]    Virtual Visit No    Medication changes reported     No    Fall or balance concerns reported    No    Warm-up and Cool-down Performed on first and last piece of equipment    Resistance Training Performed Yes    VAD Patient? No    PAD/SET Patient? No      Pain Assessment   Currently in Pain? No/denies                Social History   Tobacco Use  Smoking Status Former   Packs/day: 1.00   Years: 30.00   Pack years: 30.00   Types: Cigarettes   Quit date: 08/22/2020   Years since quitting: 1.4  Smokeless Tobacco Never    Goals Met:  Proper associated with RPD/PD & O2 Sat Independence with exercise equipment Exercise tolerated well No report of concerns or symptoms today  Goals Unmet:  Not Applicable  Comments: Pt able to follow exercise prescription today without complaint.  Will continue to monitor for progression.    Dr. Emily Filbert is Medical Director for [place].  Dr. Ottie Glazier is Medical Director for [facility] Pulmonary Rehabilitation.

## 2022-01-27 NOTE — Patient Instructions (Signed)
Discharge Patient Instructions  Patient Details  Name: Kirk Robinson MRN: [id number] Date of Birth: 1956-05-02 Referring Provider:  [name], MD   Number of Visits: 36  Reason for Discharge:  Patient reached a stable level of exercise. Patient independent in their exercise. Patient has met program and personal goals.  Smoking History:  Social History   Tobacco Use  Smoking Status Former   Packs/day: 1.00   Years: 30.00   Total pack years: 30.00   Types: Cigarettes   Quit date: 08/22/2020   Years since quitting: 1.4  Smokeless Tobacco Never    Diagnosis:  Pulmonary fibrosis ([place])  Initial Exercise Prescription:  Initial Exercise Prescription - 08/12/21 1500       Date of Initial Exercise RX and Referring Provider   Date 08/12/21    Referring Provider Raul Del      Oxygen   Oxygen Continuous    Liters 3    Maintain Oxygen Saturation 88% or higher      Treadmill   MPH 1    Grade 0.5    Minutes 15    METs 1.8      Recumbant Bike   Level 1    RPM 60    Minutes 15    METs 2      NuStep   Level 1    SPM 80    Minutes 15    METs 2      Arm Ergometer   Level 1    RPM 25    Minutes 15    METs 2      Prescription Details   Frequency (times per week) 2    Duration Progress to 30 minutes of continuous aerobic without signs/symptoms of physical distress      Intensity   THRR 40-80% of Max Heartrate 114-143    Ratings of Perceived Exertion 11-15    Perceived Dyspnea 0-4      Resistance Training   Training Prescription Yes    Weight 3 lb    Reps 10-15             Discharge Exercise Prescription (Final Exercise Prescription Changes):  Exercise Prescription Changes - 01/24/22 1400       Response to Exercise   Blood Pressure (Admit) 98/60    Blood Pressure (Exit) 108/58    Heart Rate (Admit) 101 bpm    Heart Rate (Exercise) 105 bpm    Heart Rate (Exit) 106 bpm    Oxygen Saturation (Admit) 95 %    Oxygen Saturation (Exercise) 94 %     Oxygen Saturation (Exit) 94 %    Rating of Perceived Exertion (Exercise) 13    Perceived Dyspnea (Exercise) 1    Symptoms SOB    Duration Continue with 30 min of aerobic exercise without signs/symptoms of physical distress.    Intensity THRR unchanged      Progression   Progression Continue to progress workloads to maintain intensity without signs/symptoms of physical distress.    Average METs 1.2      Resistance Training   Training Prescription Yes    Weight 6 lb    Reps 10-15      Interval Training   Interval Training No      Oxygen   Oxygen Continuous    Liters 3      Recumbant Bike   Level 1    Minutes 15    METs 2      NuStep   Level 2  Minutes 15    METs 1.5      Biostep-RELP   Level 1    Minutes 15    METs 1      Home Exercise Plan   Plans to continue exercise at Home (comment)   treadmill & weight machines   Frequency Add 3 additional days to program exercise sessions.   start with 1 day   Initial Home Exercises Provided 09/29/21      Oxygen   Maintain Oxygen Saturation 88% or higher             Functional Capacity:  6 Minute Walk     Row Name 08/12/21 1521 01/24/22 1405       6 Minute Walk   Phase Initial Discharge    Distance 500 feet 805 feet    Distance % Change -- 61 %    Distance Feet Change -- 305 ft    Walk Time 4.5 minutes 5.23 minutes    # of Rest Breaks 1 1  46 sec    MPH 1.3 1.75    METS 2.2 2.73    RPE 15 13    Perceived Dyspnea  2 1    VO2 Peak 7.7 9.56    Symptoms No No    Resting HR 86 bpm 86 bpm    Resting BP 118/62 104/58    Resting Oxygen Saturation  96 % 95 %    Exercise Oxygen Saturation  during 6 min walk 84 % 81 %    Max Ex. HR 118 bpm 122 bpm    Max Ex. BP 130/64 118/72    2 Minute Post BP 110/62 --      Interval HR   1 Minute HR 106 104    2 Minute HR 114 111    3 Minute HR 115 122    4 Minute HR 118 107    5 Minute HR 101 113    6 Minute HR 103 117    2 Minute Post HR -- 103    Interval Heart  Rate? Yes Yes      Interval Oxygen   Interval Oxygen? Yes Yes    Baseline Oxygen Saturation % 96 % 95 %    1 Minute Oxygen Saturation % 89 % 96 %    1 Minute Liters of Oxygen 3 L  pulsed 3 L  pulsed    2 Minute Oxygen Saturation % 88 % 87 %    2 Minute Liters of Oxygen 3 L  pulsed 3 L    3 Minute Oxygen Saturation % 86 % 81 %    3 Minute Liters of Oxygen 3 L  pulsed 3 L    4 Minute Oxygen Saturation % 84 %  stopped to rest - fatigue 87 %    4 Minute Liters of Oxygen 3 L  pulsed 3 L    5 Minute Oxygen Saturation % 85 % 87 %    5 Minute Liters of Oxygen 3 L  pulsed 3 L    6 Minute Oxygen Saturation % 90 % 85 %    6 Minute Liters of Oxygen 3 L  pulsed 3 L    2 Minute Post Oxygen Saturation % 91 % 92 %    2 Minute Post Liters of Oxygen 3 L  pulsed 3 L              Nutrition & Weight - Outcomes:  Pre Biometrics - 08/12/21 1531  Pre Biometrics   Height '5\' 9"'  (1.753 m)    Weight 172 lb 11.2 oz (78.3 kg)    BMI (Calculated) 25.49    Single Leg Stand 1.78 seconds             Post Biometrics - 01/24/22 1407        Post  Biometrics   Height '5\' 9"'  (1.753 m)    Weight 166 lb (75.3 kg)    BMI (Calculated) 24.5             Nutrition:  Nutrition Therapy & Goals - 10/04/21 1435       Nutrition Therapy   Diet Heart healthy, low Na, pulmonary MNT    Drug/Food Interactions Statins/Certain Fruits    Protein (specify units) 80-90g    Fiber 25 grams    Whole Grain Foods 3 servings    Saturated Fats 12 max. grams    Fruits and Vegetables 8 servings/day    Sodium 2 grams      Personal Nutrition Goals   Nutrition Goal ST: try oats with egg whites, try gummy vitamins, try blending spinach with fairlife LT: meet nutritional needs from vitamins, minerals, and protein    Comments 66 y.o. M admitted to rehab for pulmonary fibrosis also presenting with iron-deficiency anemia, T2DM (latest A1C 5.7), HLD, HLD, GERD, COPD, atherosclerosis. Relevant medications include  lipitor, vit D3, colace, slow Fe, MVI, metformin, januvia, prednisone. PYP Score: 71. Vegetables & Fruits 5/12. Breads, Grains & Cereals 8/12. Red & Processed Meat 11/12. Poultry 2/2. Fish & Shellfish 0/4. Beans, Nuts & Seeds 2/4. Milk & Dairy Foods 3/6. Toppings, Oils, Seasonings & Salt 15/20. Sweets, Snacks & Restaurant Food 14/14. Beverages 10/10. He drinks protein shakes (fairlife) Cereal (honeynut cheerios with 2% milk)  and oatmeal with toast (white toast with nothing on it in the oatmeal). Drinks: diet pepsi he has cut back on (3-4 10z bottles), and sometimes water 1 small bottle of water some days. He has had cracker jacks which he feels made him gain some weight. Last A1C 5.7. BG: high of 150, low of 80. His stroke has messed with his taste - does not eat meat, chicken, fish, eggs, he can eat beans and lentils occasionally, yogurt, cottage cheese, nuts/seeds, peanut butter. He likes fruit - grapes, pineapple, strawberries (afternoon snack). He likes cheese and crackers (afternoon snack). He does not like ensure or boost, his wife reports they have been through every protein shake and he would only drink the fairlife shake - provides 26g protein and 50% calcium - cautioned that while calcium is important, we can get too much. Louie Casa reports they have spoken to dietitans before regarding this and they have suggested the same kinds of things. He stopped his multivitamin due to it hurting his stomach, suggested trying a gummy vitamin as these can be easier on his stomach; it typically will not have iron due to the taste, but he is taking slow Fe. His wife reports he also takes capsules that has dried fruits and vegetables in them - he does not like vegetable and reports he won't have them - suggested trying to blend fairlife shake with some spinach and see if he can tolerate that. Also recommended adding egg whiles to oatmeal as a protein source. Discussed heart healthy eating, diabetes friendly eating, and  pulmonary MNT.      Intervention Plan   Intervention Prescribe, educate and counsel regarding individualized specific dietary modifications aiming towards targeted core components such as weight,  hypertension, lipid management, diabetes, heart failure and other comorbidities.;Nutrition handout(s) given to patient.    Expected Outcomes Short Term Goal: Understand basic principles of dietary content, such as calories, fat, sodium, cholesterol and nutrients.;Short Term Goal: A plan has been developed with personal nutrition goals set during dietitian appointment.;Long Term Goal: Adherence to prescribed nutrition plan.              Goals reviewed with patient; copy given to patient.

## 2022-02-02 ENCOUNTER — Encounter: Payer: Self-pay | Admitting: *Deleted

## 2022-02-02 ENCOUNTER — Encounter: Payer: Medicare HMO | Admitting: *Deleted

## 2022-02-02 DIAGNOSIS — J8410 Pulmonary fibrosis, unspecified: Secondary | ICD-10-CM

## 2022-02-02 NOTE — Progress Notes (Signed)
Daily Session Note  Patient Details  Name: Kirk Robinson MRN: [id number] Date of Birth: 11-Jul-1956 Referring Provider:   Flowsheet Row Pulmonary Rehab from 08/12/2021 in [facility] Re[facility]  Referring Provider Raul Del       Encounter Date: 02/02/2022  Check In:  Session Check In - 02/02/22 1420       Check-In   Supervising physician immediately available to respond to emergencies See telemetry face sheet for immediately available ER MD    Location [facility]-Cardiac & Pulmonary Rehab    Staff Present Heath Lark, RN, BSN, CCRP;Melissa Hoonah, RDN, Tawanna Solo, MS, ASCM CEP, Exercise Physiologist    Virtual Visit No    Medication changes reported     No    Fall or balance concerns reported    No    Warm-up and Cool-down Performed on first and last piece of equipment    Resistance Training Performed Yes    VAD Patient? No    PAD/SET Patient? No      Pain Assessment   Currently in Pain? No/denies                Social History   Tobacco Use  Smoking Status Former   Packs/day: 1.00   Years: 30.00   Total pack years: 30.00   Types: Cigarettes   Quit date: 08/22/2020   Years since quitting: 1.4  Smokeless Tobacco Never    Goals Met:  Proper associated with RPD/PD & O2 Sat Independence with exercise equipment Exercise tolerated well No report of concerns or symptoms today  Goals Unmet:  Not Applicable  Comments: Pt able to follow exercise prescription today without complaint.  Will continue to monitor for progression.    Dr. Emily Filbert is Medical Director for [place].  Dr. Ottie Glazier is Medical Director for [facility] Pulmonary Rehabilitation.

## 2022-02-02 NOTE — Progress Notes (Signed)
Pulmonary Individual Treatment Plan  Patient Details  Name: Kirk Robinson MRN: [id number] Date of Birth: Jun 08, 1956 Referring Provider:   Flowsheet Row Pulmonary Rehab from 08/12/2021 in [facility] Cardiac and Pulmonary Rehab  Referring Provider Raul Del       Initial Encounter Date:  Flowsheet Row Pulmonary Rehab from 08/12/2021 in [facility] Cardiac and Pulmonary Rehab  Date 08/12/21       Visit Diagnosis: Pulmonary fibrosis ([place])  Patient's Home Medications on Admission:  Current Outpatient Medications:    Accu-Chek FastClix Lancets MISC, Apply topically daily., Disp: , Rfl:    albuterol (VENTOLIN HFA) 108 (90 Base) MCG/ACT inhaler, Inhale 2 puffs into the lungs every 6 (six) hours as needed for wheezing or shortness of breath., Disp: 18 g, Rfl: 0   alendronate (FOSAMAX) 70 MG tablet, Take by mouth. (Patient not taking: Reported on 06/11/2020), Disp: , Rfl:    ARIPiprazole (ABILIFY) 2 MG tablet, Take 2 mg by mouth daily., Disp: , Rfl:    ARMOUR THYROID 120 MG tablet, Take 120 mg by mouth every morning., Disp: , Rfl:    ASPIRIN LOW DOSE 81 MG EC tablet, TAKE 1 TABLET BY MOUTH EVERY DAY, Disp: 90 tablet, Rfl: 4   atorvastatin (LIPITOR) 20 MG tablet, Take 1 tablet (20 mg total) by mouth daily at 6 PM., Disp: 90 tablet, Rfl: 3   Blood Glucose Monitoring Suppl (ACCU-CHEK GUIDE) w/Device KIT, See admin instructions., Disp: , Rfl:    BREZTRI AEROSPHERE 160-9-4.8 MCG/ACT AERO, SMARTSIG:2 Inhalation Via Inhaler Twice Daily, Disp: , Rfl:    clobetasol ointment (TEMOVATE) 0.05 %, Apply topically 2 (two) times daily. (Patient not taking: Reported on 07/08/2021), Disp: , Rfl:    clopidogrel (PLAVIX) 75 MG tablet, TAKE 1 TABLET BY MOUTH EVERY DAY, Disp: 90 tablet, Rfl: 3   diazepam (VALIUM) 5 MG tablet, Take 5 mg by mouth 2 (two) times daily. , Disp: , Rfl:    diazepam (VALIUM) 5 MG tablet, Take by mouth. (Patient not taking: Reported on 07/08/2021), Disp: , Rfl:    diltiazem (CARDIZEM CD) 120 MG 24 hr  capsule, Take by mouth daily., Disp: , Rfl:    Docusate Sodium 100 MG capsule, Take 1 tablet by mouth in the morning, at noon, and at bedtime., Disp: , Rfl:    Ensure Max Protein (ENSURE MAX PROTEIN) LIQD, Take 330 mLs (11 oz total) by mouth 2 (two) times daily. (Patient not taking: Reported on 06/11/2020), Disp: 330 mL, Rfl: 0   ESBRIET 267 MG TABS, Take 3 tablets by mouth 3 (three) times daily as needed., Disp: , Rfl:    famotidine (PEPCID) 20 MG tablet, Take by mouth., Disp: , Rfl:    glucose blood test strip, See admin instructions., Disp: , Rfl:    hydrocortisone 2.5 % ointment, Apply topically 2 (two) times daily. (Patient not taking: Reported on 10/19/2021), Disp: , Rfl:    linagliptin (TRADJENTA) 5 MG TABS tablet, Take 1 tablet (5 mg total) by mouth daily. (Patient not taking: Reported on 07/08/2021), Disp: 30 tablet, Rfl: 0   metFORMIN (GLUCOPHAGE) 1000 MG tablet, TAKE 1 TABLET BY MOUTH 2 TIMES DAILY WITH MEAL. (Patient taking differently: Take 1,000 mg by mouth 2 (two) times daily with a meal.), Disp: 180 tablet, Rfl: 0   metoprolol succinate (TOPROL-XL) 25 MG 24 hr tablet, Take by mouth., Disp: , Rfl:    metoprolol succinate (TOPROL-XL) 25 MG 24 hr tablet, Take 1 tablet by mouth daily., Disp: , Rfl:    mirtazapine (REMERON) 15 MG  tablet, Take by mouth., Disp: , Rfl:    mirtazapine (REMERON) 30 MG tablet, Take 30 mg by mouth at bedtime., Disp: , Rfl:    morphine (MS CONTIN) 30 MG 12 hr tablet, SMARTSIG:1 Tablet(s) By Mouth Every 12 Hours, Disp: , Rfl:    naloxone (NARCAN) nasal spray 4 mg/0.1 mL, Place into the nose., Disp: , Rfl:    pantoprazole (PROTONIX) 40 MG tablet, Take by mouth., Disp: , Rfl:    Pirfenidone 267 MG TABS, Take by mouth., Disp: , Rfl:    polyethylene glycol (MIRALAX / GLYCOLAX) 17 g packet, Take 17 g by mouth daily. (Patient not taking: Reported on 10/19/2021), Disp: 30 each, Rfl: 0   polyethylene glycol-electrolytes (NULYTELY) 420 g solution, Prepare according to  package instructions. Starting at 5:00 PM: Drink one 8 oz glass of mixture every 15 minutes until you finish half of the jug. Five hours prior to procedure, drink 8 oz glass of mixture every 15 minutes until it is all gone. Make sure you do not drink anything 4 hours prior to your procedure. (Patient not taking: Reported on 07/08/2021), Disp: 8000 mL, Rfl: 0   predniSONE (DELTASONE) 10 MG tablet, Take 20 mg by mouth daily with breakfast., Disp: , Rfl:    predniSONE (DELTASONE) 10 MG tablet, Take 5 mg by mouth every evening.  (Patient not taking: Reported on 10/19/2021), Disp: , Rfl:    predniSONE (DELTASONE) 10 MG tablet, Take 1 tablet by mouth daily. (Patient not taking: Reported on 07/08/2021), Disp: , Rfl:    rOPINIRole (REQUIP) 0.5 MG tablet, Take by mouth., Disp: , Rfl:    sitaGLIPtin (JANUVIA) 100 MG tablet, Take by mouth., Disp: , Rfl:    tapentadol HCl (NUCYNTA) 75 MG tablet, Take 75 mg by mouth every 4 (four) hours as needed for moderate pain or severe pain.  (Patient not taking: Reported on 06/11/2020), Disp: , Rfl:    thiamine 100 MG tablet, Take 1 tablet (100 mg total) by mouth daily. (Patient not taking: Reported on 06/11/2020), Disp: 30 tablet, Rfl: 0   venlafaxine XR (EFFEXOR-XR) 150 MG 24 hr capsule, Take 1 capsule (150 mg total) by mouth daily with breakfast., Disp: 90 capsule, Rfl: 1   Vitamin D, Ergocalciferol, (DRISDOL) 1.25 MG (50000 UNIT) CAPS capsule, Take by mouth., Disp: , Rfl:   Past Medical History: Past Medical History:  Diagnosis Date   Alcohol use 03/16/2007   patient risk factors   Allergic rhinitis, cause unspecified    Altered mental status    Anxiety state, unspecified    Cervicalgia 09/17/2007   COPD (chronic obstructive pulmonary disease) ([place])    Diabetes mellitus without complication ([place])    Diplopia 12/21/2007   Esophageal reflux    Gastritis 05/13/2009   HCAP (healthcare-associated pneumonia) 08/12/2014   Hematuria 03/16/2007   Hyperthyroidism     graves disease   Iodine hypothyroidism    Memory loss    NSIP (nonspecific interstitial pneumonia) ([place])    on azathioprine   Osteoporosis, unspecified    bone density per Mor[facility]ati in 2012   Other abnormal glucose 04/08/2008   Other diseases of lung, not elsewhere classified 01/23/2009   s/p [place]/pulmonology consult; intolerant  to Advair   Peripheral vascular disease, unspecified ([place]) 12/24/2009   Personal history of colonic polyps    Pneumonia, organism unspecified([place])    Pure hypercholesterolemia    Shortness of breath    Stroke ([place]) 2005   Tobacco use disorder 03/16/2007   patient risk factors  Unspecified hypothyroidism 01/07/2010   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    Tobacco Use: Social History   Tobacco Use  Smoking Status Former   Packs/day: 1.00   Years: 30.00   Total pack years: 30.00   Types: Cigarettes   Quit date: 08/22/2020   Years since quitting: 1.4  Smokeless Tobacco Never    Labs: Review Flowsheet  More data exists      Latest Ref Rng & Units 07/12/2016 03/01/2017 10/18/2017 01/24/2018  Labs for ITP Cardiac and Pulmonary Rehab  Cholestrol 100 - 199 mg/dL 167  159  170  191   LDL (calc) 0 - 99 mg/dL 91  85  72  87   HDL-C >39 mg/dL 53  49  56  47   Trlycerides 0 - 149 mg/dL 117  123  211  283   Hemoglobin A1c 4.8 - 5.6 % 6.2  6.0  6.6  6.5   Bicarbonate 20.0 - 28.0 mmol/L - - - -  O2 Saturation % - - - -      05/11/2019  Labs for ITP Cardiac and Pulmonary Rehab  Cholestrol -  LDL (calc) -  HDL-C -  Trlycerides -  Hemoglobin A1c 13.2   Bicarbonate 38.6   O2 Saturation 96.8      Pulmonary Assessment Scores:  Pulmonary Assessment Scores     Row Name 08/12/21 1536         ADL UCSD   SOB Score total 91     Rest 1     Walk 3     Stairs 4     Bath 4     Dress 4     Shop 5       CAT Score   CAT Score 20       mMRC Score   mMRC Score 2              UCSD: Self-administered rating of dyspnea  associated with activities of daily living (ADLs) 6-point scale (0 = "not at all" to 5 = "maximal or unable to do because of breathlessness")  Scoring Scores range from 0 to 120.  Minimally important difference is 5 units  CAT: CAT can identify the health impairment of COPD patients and is better correlated with disease progression.  CAT has a scoring range of zero to 40. The CAT score is classified into four groups of low (less than 10), medium (10 - 20), high (21-30) and very high (31-40) based on the impact level of disease on health status. A CAT score over 10 suggests significant symptoms.  A worsening CAT score could be explained by an exacerbation, poor medication adherence, poor inhaler technique, or progression of COPD or comorbid conditions.  CAT MCID is 2 points  mMRC: mMRC (Modified Medical Research Council) Dyspnea Scale is used to assess the degree of baseline functional disability in patients of respiratory disease due to dyspnea. No minimal important difference is established. A decrease in score of 1 point or greater is considered a positive change.   Pulmonary Function Assessment:   Exercise Target Goals: Exercise Program Goal: Individual exercise prescription set using results from initial 6 min walk test and THRR while considering  patient's activity barriers and safety.   Exercise Prescription Goal: Initial exercise prescription builds to 30-45 minutes a day of aerobic activity, 2-3 days per week.  Home exercise guidelines will be given to patient during program as part of exercise prescription that the participant will acknowledge.  Education: Aerobic Exercise: - Group verbal and visual presentation on the components of exercise prescription. Introduces F.I.T.T principle from ACSM for exercise prescriptions.  Reviews F.I.T.T. principles of aerobic exercise including progression. Written material given at graduation.   Education: Resistance Exercise: - Group verbal  and visual presentation on the components of exercise prescription. Introduces F.I.T.T principle from ACSM for exercise prescriptions  Reviews F.I.T.T. principles of resistance exercise including progression. Written material given at graduation.    Education: Exercise & Equipment Safety: - Individual verbal instruction and demonstration of equipment use and safety with use of the equipment. Flowsheet Row Pulmonary Rehab from 08/12/2021 in [facility] Cardiac and Pulmonary Rehab  Date 08/12/21  Educator AS  Instruction Review Code 1- Verbalizes Understanding       Education: Exercise Physiology & General Exercise Guidelines: - Group verbal and written instruction with models to review the exercise physiology of the cardiovascular system and associated critical values. Provides general exercise guidelines with specific guidelines to those with heart or lung disease.  Flowsheet Row Pulmonary Rehab from 08/12/2021 in [facility] Cardiac and Pulmonary Rehab  Education need identified 08/12/21       Education: Flexibility, Balance, Mind/Body Relaxation: - Group verbal and visual presentation with interactive activity on the components of exercise prescription. Introduces F.I.T.T principle from ACSM for exercise prescriptions. Reviews F.I.T.T. principles of flexibility and balance exercise training including progression. Also discusses the mind body connection.  Reviews various relaxation techniques to help reduce and manage stress (i.e. Deep breathing, progressive muscle relaxation, and visualization). Balance handout provided to take home. Written material given at graduation.   Activity Barriers & Risk Stratification:   6 Minute Walk:  6 Minute Walk     Row Name 08/12/21 1521 01/24/22 1405       6 Minute Walk   Phase Initial Discharge    Distance 500 feet 805 feet    Distance % Change -- 61 %    Distance Feet Change -- 305 ft    Walk Time 4.5 minutes 5.23 minutes    # of Rest Breaks 1 1  46  sec    MPH 1.3 1.75    METS 2.2 2.73    RPE 15 13    Perceived Dyspnea  2 1    VO2 Peak 7.7 9.56    Symptoms No No    Resting HR 86 bpm 86 bpm    Resting BP 118/62 104/58    Resting Oxygen Saturation  96 % 95 %    Exercise Oxygen Saturation  during 6 min walk 84 % 81 %    Max Ex. HR 118 bpm 122 bpm    Max Ex. BP 130/64 118/72    2 Minute Post BP 110/62 --      Interval HR   1 Minute HR 106 104    2 Minute HR 114 111    3 Minute HR 115 122    4 Minute HR 118 107    5 Minute HR 101 113    6 Minute HR 103 117    2 Minute Post HR -- 103    Interval Heart Rate? Yes Yes      Interval Oxygen   Interval Oxygen? Yes Yes    Baseline Oxygen Saturation % 96 % 95 %    1 Minute Oxygen Saturation % 89 % 96 %    1 Minute Liters of Oxygen 3 L  pulsed 3 L  pulsed    2 Minute Oxygen Saturation %  88 % 87 %    2 Minute Liters of Oxygen 3 L  pulsed 3 L    3 Minute Oxygen Saturation % 86 % 81 %    3 Minute Liters of Oxygen 3 L  pulsed 3 L    4 Minute Oxygen Saturation % 84 %  stopped to rest - fatigue 87 %    4 Minute Liters of Oxygen 3 L  pulsed 3 L    5 Minute Oxygen Saturation % 85 % 87 %    5 Minute Liters of Oxygen 3 L  pulsed 3 L    6 Minute Oxygen Saturation % 90 % 85 %    6 Minute Liters of Oxygen 3 L  pulsed 3 L    2 Minute Post Oxygen Saturation % 91 % 92 %    2 Minute Post Liters of Oxygen 3 L  pulsed 3 L            Oxygen Initial Assessment:   Oxygen Re-Evaluation:  Oxygen Re-Evaluation     Row Name 08/30/21 1337 09/29/21 1347 10/20/21 1341 12/27/21 1353 01/19/22 1344     Program Oxygen Prescription   Program Oxygen Prescription _0    Liters per minute _1 Home Oxygen   Home Oxygen Device Portable Concentrator;Home Concentrator;E-Tanks Portable Concentrator;Home Concentrator;E-Tanks Portable Concentrator;Home Concentrator;E-Tanks Portable Concentrator;Home Concentrator;E-Tanks Portable Concentrator;Home  Concentrator;E-Tanks   Sleep Oxygen Prescription _2    Liters per minute 3.5 3.5 3.5 3.5 3.5   Home Exercise Oxygen Prescription _3    Liters per minute 3.5 3.5 3.5 3.5 3.5   Home Resting Oxygen Prescription _4    Liters per minute 3.5 3.5 3.5 3.5 3.5   Compliance with Home Oxygen Use _5      Goals/Expected Outcomes   Short Term Goals To learn and exhibit compliance with exercise, home and travel O2 prescription;To learn and understand importance of monitoring SPO2 with pulse oximeter and demonstrate accurate use of the pulse oximeter.;To learn and understand importance of maintaining oxygen saturations>88%;To learn and demonstrate proper pursed lip breathing techniques or other breathing techniques.  To learn and exhibit compliance with exercise, home and travel O2 prescription;To learn and understand importance of monitoring SPO2 with pulse oximeter and demonstrate accurate use of the pulse oximeter.;To learn and understand importance of maintaining oxygen saturations>88%;To learn and demonstrate proper pursed lip breathing techniques or other breathing techniques.  To learn and exhibit compliance with exercise, home and travel O2 prescription;To learn and understand importance of monitoring SPO2 with pulse oximeter and demonstrate accurate use of the pulse oximeter.;To learn and understand importance of maintaining oxygen saturations>88%;To learn and demonstrate proper pursed lip breathing techniques or other breathing techniques.  To learn and exhibit compliance with exercise, home and travel O2 prescription;To learn and understand importance of monitoring SPO2 with pulse oximeter and demonstrate accurate use of the pulse oximeter.;To learn and understand importance of maintaining oxygen saturations>88%;To learn and demonstrate proper  pursed lip breathing techniques or other breathing techniques.  To learn and understand importance of maintaining oxygen saturations>88%;To learn and understand importance of monitoring SPO2 with pulse oximeter and demonstrate accurate use of the pulse oximeter.   Long  Term Goals Exhibits compliance with exercise, home  and travel O2 prescription;Verbalizes importance of monitoring SPO2 with pulse oximeter and return demonstration;Maintenance of O2 saturations>88%;Exhibits proper breathing techniques, such as pursed lip  breathing or other method taught during program session;Compliance with respiratory medication Exhibits compliance with exercise, home  and travel O2 prescription;Verbalizes importance of monitoring SPO2 with pulse oximeter and return demonstration;Maintenance of O2 saturations>88%;Exhibits proper breathing techniques, such as pursed lip breathing or other method taught during program session;Compliance with respiratory medication Exhibits compliance with exercise, home  and travel O2 prescription;Verbalizes importance of monitoring SPO2 with pulse oximeter and return demonstration;Maintenance of O2 saturations>88%;Exhibits proper breathing techniques, such as pursed lip breathing or other method taught during program session;Compliance with respiratory medication Exhibits compliance with exercise, home  and travel O2 prescription;Verbalizes importance of monitoring SPO2 with pulse oximeter and return demonstration;Maintenance of O2 saturations>88%;Exhibits proper breathing techniques, such as pursed lip breathing or other method taught during program session;Compliance with respiratory medication Verbalizes importance of monitoring SPO2 with pulse oximeter and return demonstration;Maintenance of O2 saturations>88%   Comments Reviewed PLB technique with pt.  Talked about how it works and it's importance in maintaining their exercise saturations. -- Louie Casa reports checking oxygen at home.  It stays  above 88% as long as he uses oxygen.  He does practice PLB. Louie Casa has been compliant with his oxygen and checks his saturations frequently. He does note that if he takes it off to run to kitchen or bathroom his sats will drop to 87% but will come right back up once he puts his oxygen back on.  He does not use his PLB as much as he should but will use it for exercise and when exerting. Clee has a pulse oximeter to check his oxygen saturation at home. Informed and explained why it is important to have one. Reviewed that oxygen saturations should be 88 percent and above.   Goals/Expected Outcomes Short: Become more profiecient at using PLB.   Long: Become independent at using PLB. -- Short: use PLB as needed Long: maintain proper use of oxygen and PLB at home Short; Use PLB more regularly Long: Continue to manage oxygen therapy. Short: monitor oxygen at home with exertion. Long: maintain oxygen saturations above 88 percent independently.            Oxygen Discharge (Final Oxygen Re-Evaluation):  Oxygen Re-Evaluation - 01/19/22 1344       Program Oxygen Prescription   Program Oxygen Prescription Continuous    Liters per minute 3      Home Oxygen   Home Oxygen Device Portable Concentrator;Home Concentrator;E-Tanks    Sleep Oxygen Prescription Continuous    Liters per minute 3.5    Home Exercise Oxygen Prescription Continuous    Liters per minute 3.5    Home Resting Oxygen Prescription Continuous    Liters per minute 3.5    Compliance with Home Oxygen Use Yes      Goals/Expected Outcomes   Short Term Goals To learn and understand importance of maintaining oxygen saturations>88%;To learn and understand importance of monitoring SPO2 with pulse oximeter and demonstrate accurate use of the pulse oximeter.    Long  Term Goals Verbalizes importance of monitoring SPO2 with pulse oximeter and return demonstration;Maintenance of O2 saturations>88%    Comments Steffen has a pulse oximeter to check his  oxygen saturation at home. Informed and explained why it is important to have one. Reviewed that oxygen saturations should be 88 percent and above.    Goals/Expected Outcomes Short: monitor oxygen at home with exertion. Long: maintain oxygen saturations above 88 percent independently.             Initial Exercise Prescription:  Initial  Exercise Prescription - 08/12/21 1500       Date of Initial Exercise RX and Referring Provider   Date 08/12/21    Referring Provider Raul Del      Oxygen   Oxygen Continuous    Liters 3    Maintain Oxygen Saturation 88% or higher      Treadmill   MPH 1    Grade 0.5    Minutes 15    METs 1.8      Recumbant Bike   Level 1    RPM 60    Minutes 15    METs 2      NuStep   Level 1    SPM 80    Minutes 15    METs 2      Arm Ergometer   Level 1    RPM 25    Minutes 15    METs 2      Prescription Details   Frequency (times per week) 2    Duration Progress to 30 minutes of continuous aerobic without signs/symptoms of physical distress      Intensity   THRR 40-80% of Max Heartrate 114-143    Ratings of Perceived Exertion 11-15    Perceived Dyspnea 0-4      Resistance Training   Training Prescription Yes    Weight 3 lb    Reps 10-15             Perform Capillary Blood Glucose checks as needed.  Exercise Prescription Changes:   Exercise Prescription Changes     Row Name 08/12/21 1500 09/06/21 1300 09/20/21 1100 09/29/21 1500 10/04/21 1000     Response to Exercise   Blood Pressure (Admit) 118/62 108/68 122/68 -- 118/64   Blood Pressure (Exercise) 130/64 128/64 128/68 -- --   Blood Pressure (Exit) 110/62 112/64 110/60 -- 142/70   Heart Rate (Admit) 86 bpm 87 bpm 96 bpm -- 83 bpm   Heart Rate (Exercise) 118 bpm 105 bpm 96 bpm -- 111 bpm   Heart Rate (Exit) 106 bpm 88 bpm 91 bpm -- 105 bpm   Oxygen Saturation (Admit) 96 % 99 % 98 % -- 98 %   Oxygen Saturation (Exercise) 84 % 93 % 92 % -- 94 %   Oxygen Saturation (Exit)  91 % 95 % 97 % -- 98 %   Rating of Perceived Exertion (Exercise) _0 -- 12   Perceived Dyspnea (Exercise) _1 -- 3   Symptoms none none SOB -- SOB   Comments -- second full day of exercise -- -- --   Duration -- Progress to 30 minutes of  aerobic without signs/symptoms of physical distress Progress to 30 minutes of  aerobic without signs/symptoms of physical distress -- Progress to 30 minutes of  aerobic without signs/symptoms of physical distress   Intensity -- THRR unchanged THRR unchanged -- THRR unchanged     Progression   Progression -- Continue to progress workloads to maintain intensity without signs/symptoms of physical distress. Continue to progress workloads to maintain intensity without signs/symptoms of physical distress. -- Continue to progress workloads to maintain intensity without signs/symptoms of physical distress.   Average METs -- 2.03 2.35 -- 1.88     Resistance Training   Training Prescription -- Yes Yes -- Yes   Weight -- 3 lb 3 lb -- 3 lb   Reps -- 10-15 10-15 -- 10-15     Interval Training   Interval Training -- No No -- No  Oxygen   Oxygen -- Continuous Continuous -- Continuous   Liters -- 3 3 -- 3     Treadmill   MPH -- 0.6 -- -- 0.5   Grade -- 0 -- -- 0   Minutes -- 15 -- -- 15   METs -- 1.46 -- -- 1.4     Recumbant Bike   Level -- 2 1 -- --   Minutes -- 15 15 -- --   METs -- 2.9 3 -- --     NuStep   Level -- 2 1 -- 2   Minutes -- 15 15 -- 15   METs -- 1.7 1.7 -- 1.8     T5 Nustep   Level -- -- -- -- 3   Minutes -- -- -- -- 15   METs -- -- -- -- 1.9     Biostep-RELP   Level -- -- -- -- 1   Minutes -- -- -- -- 15   METs -- -- -- -- 2     Home Exercise Plan   Plans to continue exercise at -- -- -- Home (comment)  treadmill & weight machines Home (comment)  treadmill & weight machines   Frequency -- -- -- Add 3 additional days to program exercise sessions.  start with 1 day Add 3 additional days to program exercise sessions.   start with 1 day   Initial Home Exercises Provided -- -- -- 09/29/21 09/29/21     Oxygen   Maintain Oxygen Saturation -- 88% or higher -- 88% or higher 88% or higher    Row Name 10/18/21 0800 11/01/21 1500 11/15/21 0800 11/29/21 0900 12/27/21 0800     Response to Exercise   Blood Pressure (Admit) 104/60 102/60 126/62 116/60 128/68   Blood Pressure (Exit) 102/60 122/68 118/78 106/60 100/64   Heart Rate (Admit) 79 bpm 87 bpm 102 bpm 96 bpm 88 bpm   Heart Rate (Exercise) 103 bpm 102 bpm 122 bpm 105 bpm 90 bpm   Heart Rate (Exit) 86 bpm 80 bpm 111 bpm 101 bpm 90 bpm   Oxygen Saturation (Admit) 97 % 96 % 96 % 96 % 89 %   Oxygen Saturation (Exercise) 93 % 94 % 95 % 97 % 95 %   Oxygen Saturation (Exit) 97 % 97 % 95 % 98 % 96 %   Rating of Perceived Exertion (Exercise) 15 13 -- 13 13   Perceived Dyspnea (Exercise) _0 Symptoms _1    Duration Progress to 30 minutes of  aerobic without signs/symptoms of physical distress Continue with 30 min of aerobic exercise without signs/symptoms of physical distress. Continue with 30 min of aerobic exercise without signs/symptoms of physical distress. Continue with 30 min of aerobic exercise without signs/symptoms of physical distress. Continue with 30 min of aerobic exercise without signs/symptoms of physical distress.   Intensity _2      Progression   Progression Continue to progress workloads to maintain intensity without signs/symptoms of physical distress. Continue to progress workloads to maintain intensity without signs/symptoms of physical distress. Continue to progress workloads to maintain intensity without signs/symptoms of physical distress. Continue to progress workloads to maintain intensity without signs/symptoms of physical distress. Continue to progress workloads to maintain intensity without signs/symptoms of physical distress.   Average METs 2.2 1.93  2.4 2.06 2.01     Resistance Training   Training Prescription _3   Weight 3 lb 6 lb 6 lb 6 lb 5 lb   Reps 10-15 10-15 10-15 10-15 10-15     Interval Training   Interval Training No No -- No No     Oxygen   Oxygen _0    Liters _1 Treadmill   MPH 0.7 0._2 0.8   Grade 0 0.5 0.5 0.5 0.5   Minutes _3 METs 1.54 1.7 1.83 1.83 1.7     Recumbant Bike   Level -- _4 RPM -- 50 -- -- --   Watts -- 16 -- 20 16   Minutes -- _5 METs -- 2.53 2.99 2.99 2.63     NuStep   Level 2 2 -- -- 2   Minutes 15 15 -- -- 15   METs 2 1.5 -- -- 1.7     REL-XR   Level -- -- -- 1 --   Minutes -- -- -- 15 --   METs -- -- -- 1.3 --     Home Exercise Plan   Plans to continue exercise at Home (comment)  treadmill & weight machines Home (comment)  treadmill & weight machines Home (comment)  treadmill & weight machines Home (comment)  treadmill & weight machines Home (comment)  treadmill & weight machines   Frequency Add 3 additional days to program exercise sessions.  start with 1 day Add 3 additional days to program exercise sessions.  start with 1 day Add 3 additional days to program exercise sessions.  start with 1 day Add 3 additional days to program exercise sessions.  start with 1 day Add 3 additional days to program exercise sessions.  start with 1 day   Initial Home Exercises Provided 09/29/21 09/29/21 09/29/21 09/29/21 09/29/21     Oxygen   Maintain Oxygen Saturation 88% or higher 88% or higher 88% or higher 88% or higher 88% or higher    Row Name 01/10/22 0800 01/24/22 1400           Response to Exercise   Blood Pressure (Admit) 108/60 98/60      Blood Pressure (Exit) 112/62 108/58      Heart Rate (Admit) 94 bpm 101 bpm      Heart Rate (Exercise) 108 bpm 105 bpm      Heart Rate (Exit) 99 bpm 106 bpm      Oxygen Saturation (Admit) 95 % 95 %      Oxygen Saturation (Exercise) 91  % 94 %      Oxygen Saturation (Exit) 96 % 94 %      Rating of Perceived Exertion (Exercise) 15 13      Perceived Dyspnea (Exercise) 1 1      Symptoms SOB SOB      Duration Continue with 30 min of aerobic exercise without signs/symptoms of physical distress. Continue with 30 min of aerobic exercise without signs/symptoms of physical distress.      Intensity THRR unchanged THRR unchanged        Progression   Progression Continue to progress workloads to maintain intensity without signs/symptoms of physical distress. Continue to progress workloads to maintain intensity without signs/symptoms of physical distress.      Average METs 1.91 1.2        Resistance Training   Training Prescription Yes Yes      Weight 5 lb 6 lb  Reps 10-15 10-15        Interval Training   Interval Training No No        Oxygen   Oxygen Continuous Continuous      Liters 3 3        Treadmill   MPH 0.8 --      Grade 0.5 --      Minutes 15 --      METs 1.7 --        Recumbant Bike   Level 1 1      Minutes 15 15      METs 3.02 2        NuStep   Level 3 2      Minutes 15 15      METs 1.2 1.5        Biostep-RELP   Level -- 1      Minutes -- 15      METs -- 1        Home Exercise Plan   Plans to continue exercise at Home (comment)  treadmill & weight machines Home (comment)  treadmill & weight machines      Frequency Add 3 additional days to program exercise sessions.  start with 1 day Add 3 additional days to program exercise sessions.  start with 1 day      Initial Home Exercises Provided 09/29/21 09/29/21        Oxygen   Maintain Oxygen Saturation 88% or higher 88% or higher               Exercise Comments:   Exercise Comments     Row Name 08/30/21 1336           Exercise Comments First full day of exercise!  Patient was oriented to gym and equipment including functions, settings, policies, and procedures.  Patient's individual exercise prescription and treatment plan were  reviewed.  All starting workloads were established based on the results of the 6 minute walk test done at initial orientation visit.  The plan for exercise progression was also introduced and progression will be customized based on patient's performance and goals.                Exercise Goals and Review:   Exercise Goals     Row Name 08/12/21 1530             Exercise Goals   Increase Physical Activity Yes       Intervention Provide advice, education, support and counseling about physical activity/exercise needs.;Develop an individualized exercise prescription for aerobic and resistive training based on initial evaluation findings, risk stratification, comorbidities and participant's personal goals.       Expected Outcomes Short Term: Attend rehab on a regular basis to increase amount of physical activity.;Long Term: Add in home exercise to make exercise part of routine and to increase amount of physical activity.;Long Term: Exercising regularly at least 3-5 days a week.       Increase Strength and Stamina Yes       Intervention Provide advice, education, support and counseling about physical activity/exercise needs.       Expected Outcomes Short Term: Increase workloads from initial exercise prescription for resistance, speed, and METs.;Short Term: Perform resistance training exercises routinely during rehab and add in resistance training at home;Long Term: Improve cardiorespiratory fitness, muscular endurance and strength as measured by increased METs and functional capacity (6MWT)       Able to understand and use rate of  perceived exertion (RPE) scale Yes       Intervention Provide education and explanation on how to use RPE scale       Expected Outcomes Short Term: Able to use RPE daily in rehab to express subjective intensity level;Long Term:  Able to use RPE to guide intensity level when exercising independently       Able to understand and use Dyspnea scale Yes       Intervention  Provide education and explanation on how to use Dyspnea scale       Expected Outcomes Short Term: Able to use Dyspnea scale daily in rehab to express subjective sense of shortness of breath during exertion;Long Term: Able to use Dyspnea scale to guide intensity level when exercising independently       Knowledge and understanding of Target Heart Rate Range (THRR) Yes       Intervention Provide education and explanation of THRR including how the numbers were predicted and where they are located for reference       Expected Outcomes Short Term: Able to state/look up THRR;Short Term: Able to use daily as guideline for intensity in rehab;Long Term: Able to use THRR to govern intensity when exercising independently       Able to check pulse independently Yes       Intervention Provide education and demonstration on how to check pulse in carotid and radial arteries.;Review the importance of being able to check your own pulse for safety during independent exercise       Expected Outcomes Short Term: Able to explain why pulse checking is important during independent exercise;Long Term: Able to check pulse independently and accurately       Understanding of Exercise Prescription Yes       Intervention Provide education, explanation, and written materials on patient's individual exercise prescription       Expected Outcomes Short Term: Able to explain program exercise prescription;Long Term: Able to explain home exercise prescription to exercise independently                Exercise Goals Re-Evaluation :  Exercise Goals Re-Evaluation     Row Name 08/30/21 1336 09/06/21 1329 09/20/21 1127 09/20/21 1129 09/29/21 1515     Exercise Goal Re-Evaluation   Exercise Goals Review Increase Physical Activity;Able to understand and use rate of perceived exertion (RPE) scale;Knowledge and understanding of Target Heart Rate Range (THRR);Understanding of Exercise Prescription;Increase Strength and Stamina;Able to  understand and use Dyspnea scale;Able to check pulse independently Increase Physical Activity;Increase Strength and Stamina;Understanding of Exercise Prescription Increase Physical Activity;Increase Strength and Stamina -- Increase Physical Activity;Increase Strength and Stamina;Understanding of Exercise Prescription   Comments Reviewed RPE and dyspnea scales, THR and program prescription with pt today.  Pt voiced understanding and was given a copy of goals to take home. Louie Casa is off to a good start in rehab.  He has completed his first two full days of rehab.  We will continue to monitor his progress. Louie Casa attends consistently.  Oxygen has stayed in 90s during all sessions. He has reached THR range during a couple sessions.  Staff will monitor progress. Reviewed home exercise with pt today.  Pt plans to walk and do weight machines for exercise. He also has  a treadmill to walk on at home. We reviewed the [facility] and he is thinking of joining.  Reviewed THR, pulse, RPE, sign and symptoms, pulse oximetery and when to call 911 or MD.  Also discussed weather considerations and indoor  options.  Pt voiced understanding.   Expected Outcomes Short: Use RPE daily to regulate intensity. Long: Follow program prescription in THR. Short: Attend rehab regularly Long: Continue to follow program prescription -- Short: maintain consistent attendance Long:  continue to build stamina Short: Start with 1 day of exercise Long: Exercise independently at home    [place] Name 10/04/21 1042 10/18/21 0809 10/20/21 1352 11/01/21 1531 11/15/21 0830     Exercise Goal Re-Evaluation   Exercise Goals Review Increase Physical Activity;Increase Strength and Stamina Increase Physical Activity;Increase Strength and Stamina Increase Physical Activity;Increase Strength and Stamina Increase Physical Activity;Increase Strength and Stamina;Understanding of Exercise Prescription Increase Physical Activity;Increase Strength and Stamina   Comments  Louie Casa tried out the T5 Nustep and was able to tolerate level 3. He should be encouraged to increase his T4 as well. He still has a hard time walking and can only tolerate 0.5 speed on the treadmill. Staff will encourage to increase speed as appropriate. Fortunately, his O2 stays above 88% during his exercise. Louie Casa has been able to increase to .7 mph on TM.  He reaches lower end of THR range during some sessions.  Staff will encourage trying 4 lb for strength work. Louie Casa has small weights and uses those and stretches at home.  He has increased to 6 lb for strength work in class. Louie Casa is doing well in rehab.  He attends twice a week usually.  He is up to 0.8 mph on the treadmill with 0.5% grade.  We will continue to monitor his progress. Louie Casa has progressed to 1 mph on TM.  He uses 6 lb for strength training.  We will continue to monitor progress.   Expected Outcomes Short: Increase load on T4 and treadmill as tolerated Long: Continue to increase overall MET level Short : try 4 lb,  continue to build stamina on TM Long:  improve overall stamina Short: attend consistently Long: conintue to build strength and stamina Short: Continue to increase workloads Long: COnitnue to improve stamina Short:  continue to build stamina walking Long: improve average MET level    Row Name 11/29/21 0907 12/13/21 0821 12/27/21 0844 12/27/21 1345 01/10/22 0824     Exercise Goal Re-Evaluation   Exercise Goals Review Increase Physical Activity;Increase Strength and Stamina Increase Physical Activity;Increase Strength and Stamina Increase Physical Activity;Increase Strength and Stamina;Understanding of Exercise Prescription Increase Physical Activity;Increase Strength and Stamina;Understanding of Exercise Prescription Increase Physical Activity;Increase Strength and Stamina;Understanding of Exercise Prescription   Comments Louie Casa continues to do well, although he has been out for a couple weeks. He tolerated the XR for the first  time. He has reached 3 METS on the RB. He still struggles to walk on the treadmill and is doing the best he can. He reaches his [place] some sessions. Louie Casa has not been here since last reivew as he is waiting results from a CT scan. We will continue to follow up with the patient on his attendance. Louie Casa returned this week after missing just over a month.  He was able to return to his previous workloads, but did drop handweights down to 5lb.  We will continue to montior his progress. Louie Casa is glad to be back in rehab.  He was doing some walking while he was out, but not too much.  He has been able to jump back in.  He is now on the NuStep and it feels like his feet keep slipping so we talked about pushing with heel versus toes. Louie Casa is still getting  back into the routine of his exercise since he was out for a little while. He has not been able to increase his workload on the treadmill as he feels too SOB. He, however, was able to increase to level 3 on the T4 Nustep. We will encourage to increase other workloads on other machines to help improve his overall stamina. He will be due for his post 6MWT soon and we hope to see some improvement.   Expected Outcomes Short: Increase load on RB Long: Continue to increase overall strength and stamina Short: Attend consistently Long: Increase overall MET level Short: Continue consistent attendance again Long: Continue to improve stamina Short: Continue to attend regularly Long: Conitnue to rebuild stamina. Short: Improve on post 6MWT Long: Continue to increase overall MET level    Row Name 01/24/22 1412             Exercise Goal Re-Evaluation   Exercise Goals Review Increase Physical Activity;Increase Strength and Stamina;Understanding of Exercise Prescription       Comments Louie Casa improved his walk test by 360f!!  He is also now up to 6 lb hand weights.  We will continue to monitor his progress.       Expected Outcomes Short: Continue to work toward graduation Long:  Continue to improve stamina                Discharge Exercise Prescription (Final Exercise Prescription Changes):  Exercise Prescription Changes - 01/24/22 1400       Response to Exercise   Blood Pressure (Admit) 98/60    Blood Pressure (Exit) 108/58    Heart Rate (Admit) 101 bpm    Heart Rate (Exercise) 105 bpm    Heart Rate (Exit) 106 bpm    Oxygen Saturation (Admit) 95 %    Oxygen Saturation (Exercise) 94 %    Oxygen Saturation (Exit) 94 %    Rating of Perceived Exertion (Exercise) 13    Perceived Dyspnea (Exercise) 1    Symptoms SOB    Duration Continue with 30 min of aerobic exercise without signs/symptoms of physical distress.    Intensity THRR unchanged      Progression   Progression Continue to progress workloads to maintain intensity without signs/symptoms of physical distress.    Average METs 1.2      Resistance Training   Training Prescription Yes    Weight 6 lb    Reps 10-15      Interval Training   Interval Training No      Oxygen   Oxygen Continuous    Liters 3      Recumbant Bike   Level 1    Minutes 15    METs 2      NuStep   Level 2    Minutes 15    METs 1.5      Biostep-RELP   Level 1    Minutes 15    METs 1      Home Exercise Plan   Plans to continue exercise at Home (comment)   treadmill & weight machines   Frequency Add 3 additional days to program exercise sessions.   start with 1 day   Initial Home Exercises Provided 09/29/21      Oxygen   Maintain Oxygen Saturation 88% or higher             Nutrition:  Target Goals: Understanding of nutrition guidelines, daily intake of sodium <15065m cholesterol <20070mcalories 30% from fat and 7% or less from  saturated fats, daily to have 5 or more servings of fruits and vegetables.  Education: All About Nutrition: -Group instruction provided by verbal, written material, interactive activities, discussions, models, and posters to present general guidelines for heart healthy  nutrition including fat, fiber, MyPlate, the role of sodium in heart healthy nutrition, utilization of the nutrition label, and utilization of this knowledge for meal planning. Follow up email sent as well. Written material given at graduation.   Biometrics:  Pre Biometrics - 08/12/21 1531       Pre Biometrics   Height _0  (1.753 m)    Weight 172 lb 11.2 oz (78.3 kg)    BMI (Calculated) 25.49    Single Leg Stand 1.78 seconds             Post Biometrics - 01/24/22 1407        Post  Biometrics   Height _1  (1.753 m)    Weight 166 lb (75.3 kg)    BMI (Calculated) 24.5             Nutrition Therapy Plan and Nutrition Goals:  Nutrition Therapy & Goals - 10/04/21 1435       Nutrition Therapy   Diet Heart healthy, low Na, pulmonary MNT    Drug/Food Interactions Statins/Certain Fruits    Protein (specify units) 80-90g    Fiber 25 grams    Whole Grain Foods 3 servings    Saturated Fats 12 max. grams    Fruits and Vegetables 8 servings/day    Sodium 2 grams      Personal Nutrition Goals   Nutrition Goal ST: try oats with egg whites, try gummy vitamins, try blending spinach with fairlife LT: meet nutritional needs from vitamins, minerals, and protein    Comments 66 y.o. M admitted to rehab for pulmonary fibrosis also presenting with iron-deficiency anemia, T2DM (latest A1C 5.7), HLD, HLD, GERD, COPD, atherosclerosis. Relevant medications include lipitor, vit D3, colace, slow Fe, MVI, metformin, januvia, prednisone. PYP Score: 71. Vegetables & Fruits 5/12. Breads, Grains & Cereals 8/12. Red & Processed Meat 11/12. Poultry 2/2. Fish & Shellfish 0/4. Beans, Nuts & Seeds 2/4. Milk & Dairy Foods 3/6. Toppings, Oils, Seasonings & Salt 15/20. Sweets, Snacks & Restaurant Food 14/14. Beverages 10/10. He drinks protein shakes (fairlife) Cereal (honeynut cheerios with 2% milk)  and oatmeal with toast (white toast with nothing on it in the oatmeal). Drinks: diet pepsi he has cut back on  (3-4 10z bottles), and sometimes water 1 small bottle of water some days. He has had cracker jacks which he feels made him gain some weight. Last A1C 5.7. BG: high of 150, low of 80. His stroke has messed with his taste - does not eat meat, chicken, fish, eggs, he can eat beans and lentils occasionally, yogurt, cottage cheese, nuts/seeds, peanut butter. He likes fruit - grapes, pineapple, strawberries (afternoon snack). He likes cheese and crackers (afternoon snack). He does not like ensure or boost, his wife reports they have been through every protein shake and he would only drink the fairlife shake - provides 26g protein and 50% calcium - cautioned that while calcium is important, we can get too much. Louie Casa reports they have spoken to dietitans before regarding this and they have suggested the same kinds of things. He stopped his multivitamin due to it hurting his stomach, suggested trying a gummy vitamin as these can be easier on his stomach; it typically will not have iron due to the taste, but he is  taking slow Fe. His wife reports he also takes capsules that has dried fruits and vegetables in them - he does not like vegetable and reports he won't have them - suggested trying to blend fairlife shake with some spinach and see if he can tolerate that. Also recommended adding egg whiles to oatmeal as a protein source. Discussed heart healthy eating, diabetes friendly eating, and pulmonary MNT.      Intervention Plan   Intervention Prescribe, educate and counsel regarding individualized specific dietary modifications aiming towards targeted core components such as weight, hypertension, lipid management, diabetes, heart failure and other comorbidities.;Nutrition handout(s) given to patient.    Expected Outcomes Short Term Goal: Understand basic principles of dietary content, such as calories, fat, sodium, cholesterol and nutrients.;Short Term Goal: A plan has been developed with personal nutrition goals set  during dietitian appointment.;Long Term Goal: Adherence to prescribed nutrition plan.             Nutrition Assessments:  MEDIFICTS Score Key: ?70 Need to make dietary changes  40-70 Heart Healthy Diet ? 40 Therapeutic Level Cholesterol Diet  Flowsheet Row Pulmonary Rehab from 08/12/2021 in [facility]  Picture Your Plate Total Score on Admission 71      Picture Your Plate Scores: <70 Unhealthy dietary pattern with much room for improvement. 41-50 Dietary pattern unlikely to meet recommendations for good health and room for improvement. 51-60 More healthful dietary pattern, with some room for improvement.  >60 Healthy dietary pattern, although there may be some specific behaviors that could be improved.   Nutrition Goals Re-Evaluation:  Nutrition Goals Re-Evaluation     [place] Name 09/08/21 1342 09/29/21 1517 12/27/21 1349 01/19/22 1347       Goals   Current Weight 171 lb (77.6 kg) -- -- 167 lb (75.8 kg)    Nutrition Goal Eat more protien. Eat smaller meals. Not established with RD. ST: try oats with egg whites, try gummy vitamins, try blending spinach with fairlife LT: meet nutritional needs from vitamins, minerals, and protein Find foods that are taste good and are a healthy option since his stroke.    Comment Patient has had a stroke and he still feels like things taste and smell bad from it.Patient was informed on why it is important to maintain a balanced diet when dealing with Respiratory issues. Explained that it takes a lot of energy to breath and when they are short of breath often they will need to have a good diet to help keep up with the calories they are expending for breathing. Louie Casa has not met with the RD yet and will be meeting with her next week. Louie Casa is doing well with his diet.  He has not made many changes so far.  He was not a big fan of eggs in the morning, but he has added in the gummy vitamins to balance needs out. Patient was informed on  why it is important to maintain a balanced diet when dealing with Respiratory issues. Explained that it takes a lot of energy to breath and when they are short of breath often they will need to have a good diet to help keep up with the calories they are expending for breathing.    Expected Outcome Short: eat more protien. Long: maintain protien and blood glucose. Short: Meet with RD and establish goals Long: Continue to follow healthy pulmonary based diet Short: Conitnue to make sure getting good balance Long: Conitnue to get in a variety. Short: Choose  and plan snacks accordingly to patients caloric intake to improve breathing. Long: Maintain a diet independently that meets their caloric intake to aid in daily shortness of breath.             Nutrition Goals Discharge (Final Nutrition Goals Re-Evaluation):  Nutrition Goals Re-Evaluation - 01/19/22 1347       Goals   Current Weight 167 lb (75.8 kg)    Nutrition Goal Find foods that are taste good and are a healthy option since his stroke.    Comment Patient was informed on why it is important to maintain a balanced diet when dealing with Respiratory issues. Explained that it takes a lot of energy to breath and when they are short of breath often they will need to have a good diet to help keep up with the calories they are expending for breathing.    Expected Outcome Short: Choose and plan snacks accordingly to patients caloric intake to improve breathing. Long: Maintain a diet independently that meets their caloric intake to aid in daily shortness of breath.             Psychosocial: Target Goals: Acknowledge presence or absence of significant depression and/or stress, maximize coping skills, provide positive support system. Participant is able to verbalize types and ability to use techniques and skills needed for reducing stress and depression.   Education: Stress, Anxiety, and Depression - Group verbal and visual presentation to define  topics covered.  Reviews how body is impacted by stress, anxiety, and depression.  Also discusses healthy ways to reduce stress and to treat/manage anxiety and depression.  Written material given at graduation.   Education: Sleep Hygiene -Provides group verbal and written instruction about how sleep can affect your health.  Define sleep hygiene, discuss sleep cycles and impact of sleep habits. Review good sleep hygiene tips.    Initial Review & Psychosocial Screening:   Quality of Life Scores:  Scores of 19 and below usually indicate a poorer quality of life in these areas.  A difference of  2-3 points is a clinically meaningful difference.  A difference of 2-3 points in the total score of the Quality of Life Index has been associated with significant improvement in overall quality of life, self-image, physical symptoms, and general health in studies assessing change in quality of life.  PHQ-9: Review Flowsheet  More data exists      01/19/2022 09/08/2021 08/12/2021 01/24/2018 10/18/2017  Depression screen PHQ 2/9  Decreased Interest _0 0 1  Down, Depressed, Hopeless _1 0 1  PHQ - 2 Score _2 0 2  Altered sleeping 0 3 3 - 1  Tired, decreased energy _3 - 1  Change in appetite _4 - 1  Feeling bad or failure about yourself  1 0 2 - 1  Trouble concentrating _5 - 0  Moving slowly or fidgety/restless _6 - 0  Suicidal thoughts 0 0 0 - 0  PHQ-9 Score _7 - 6  Difficult doing work/chores Somewhat difficult Somewhat difficult Very difficult - -   Interpretation of Total Score  Total Score Depression Severity:  1-4 = Minimal depression, 5-9 = Mild depression, 10-14 = Moderate depression, 15-19 = Moderately severe depression, 20-27 = Severe depression   Psychosocial Evaluation and Intervention:   Psychosocial Re-Evaluation:  Psychosocial Re-Evaluation     Row Name 09/08/21 1348 09/29/21 1352 10/20/21 1343 12/27/21 1347 01/19/22 1353  Psychosocial Re-Evaluation    Current issues with Current Psychotropic Meds;Current Depression;History of Depression;Current Anxiety/Panic Current Psychotropic Meds;Current Depression;History of Depression;Current Anxiety/Panic Current Psychotropic Meds;History of Depression;Current Anxiety/Panic Current Psychotropic Meds;History of Depression;Current Anxiety/Panic Current Psychotropic Meds;History of Depression;Current Anxiety/Panic   Comments Reviewed patient health questionnaire (PHQ-9) with patient for follow up. Previously, patients score indicated signs/symptoms of depression.  Reviewed to see if patient is improving symptom wise while in program.  Score improved and patient states that it is becuase he is able to come and workout. He has alot to improve upon and has a positive attitude about it. Randy wakes up at 4 am and has trouble going back to sleep. He used to get up at that time for work and can't get out of the habit. He takes anti-depressant which helps him with his sleep a little bit. He is not interested in taking anything else. His wife is good support and does not have any other concerns at this time. Louie Casa reports he is sleeping better and can go back to sleep easily.  He still feels tired.  He reports taking medications and no change is anxiety/depression.  He feels exercise helps a little bit with sleep and  mental health. Louie Casa is doing well mentally.  He is feeling good overall.  He is still sleeping good for most part.  He has days of anxiety and depression as it seems to come and go, but for most part feeling good and well managed. Reviewed patient health questionnaire (PHQ-9) with patient for follow up. Previously, patients score indicated signs/symptoms of depression.  Reviewed to see if patient is improving symptom wise while in program.  Score improved and patient states that it is because he has been able to exercise and have a little more energy.   Expected Outcomes Short: Continue to attend [facility]  regularly for regular exercise and social engagement. Long: Continue to improve symptoms and manage a positive mental state. Short: Continue attendance Long: Utilize exercise for stress management and maintain positive attitude Short: continue to exercise to help manage stress an dsleep better Long: continue to work on better sleep and manage depression/anxiety short: Continue to cope with anxiety Long: Conitnue to focus on positive. Short: Continue to attend [facility] regularly for regular exercise and social engagement. Long: Continue to improve symptoms and manage a positive mental state.   Interventions Encouraged to attend [facility]monary Rehabilitation for the exercise Encouraged to attend Pulmonary Rehabilitation for the exercise -- -- Encouraged to attend Pulmonary Rehabilitation for the exercise   Continue Psychosocial Services  Follow up required by staff Follow up required by staff -- -- Follow up required by staff            Psychosocial Discharge (Final Psychosocial Re-Evaluation):  Psychosocial Re-Evaluation - 01/19/22 1353       Psychosocial Re-Evaluation   Current issues with Current Psychotropic Meds;History of Depression;Current Anxiety/Panic    Comments Reviewed patient health questionnaire (PHQ-9) with patient for follow up. Previously, patients score indicated signs/symptoms of depression.  Reviewed to see if patient is improving symptom wise while in program.  Score improved and patient states that it is because he has been able to exercise and have a little more energy.    Expected Outcomes Short: Continue to attend [facility] regularly for regular exercise and social engagement. Long: Continue to improve symptoms and manage a positive mental state.    Interventions Encouraged to attend [facility] Rehabilitation for the exercise    Continue Psychosocial Services  Follow  up required by staff             Education: Education Goals: Education classes will be provided on a  weekly basis, covering required topics. Participant will state understanding/return demonstration of topics presented.  Learning Barriers/Preferences:   General Pulmonary Education Topics:  Infection Prevention: - Provides verbal and written material to individual with discussion of infection control including proper hand washing and proper equipment cleaning during exercise session. Flowsheet Row Pulmonary Rehab from 08/12/2021 in [facility] Cardiac and Pulmonary Rehab  Date 08/12/21  Educator AS  Instruction Review Code 1- Verbalizes Understanding       Falls Prevention: - Provides verbal and written material to individual with discussion of falls prevention and safety. Flowsheet Row Pulmonary Rehab from 08/12/2021 in [facility] Cardiac and Pulmonary Rehab  Date 08/12/21  Educator AS  Instruction Review Code 1- Verbalizes Understanding       Chronic Lung Disease Review: - Group verbal instruction with posters, models, PowerPoint presentations and videos,  to review new updates, new respiratory medications, new advancements in procedures and treatments. Providing information on websites and "800" numbers for continued self-education. Includes information about supplement oxygen, available portable oxygen systems, continuous and intermittent flow rates, oxygen safety, concentrators, and Medicare reimbursement for oxygen. Explanation of Pulmonary Drugs, including class, frequency, complications, importance of spacers, rinsing mouth after steroid MDI's, and proper cleaning methods for nebulizers. Review of basic lung anatomy and physiology related to function, structure, and complications of lung disease. Review of risk factors. Discussion about methods for diagnosing sleep apnea and types of masks and machines for OSA. Includes a review of the use of types of environmental controls: home humidity, furnaces, filters, dust mite/pet prevention, HEPA vacuums. Discussion about weather changes, air quality  and the benefits of nasal washing. Instruction on Warning signs, infection symptoms, calling MD promptly, preventive modes, and value of vaccinations. Review of effective airway clearance, coughing and/or vibration techniques. Emphasizing that all should Create an Action Plan. Written material given at graduation. Flowsheet Row Pulmonary Rehab from 08/12/2021 in [facility] Cardiac and Pulmonary Rehab  Date 08/12/21       AED/CPR: - Group verbal and written instruction with the use of models to demonstrate the basic use of the AED with the basic ABC's of resuscitation.    Anatomy and Cardiac Procedures: - Group verbal and visual presentation and models provide information about basic cardiac anatomy and function. Reviews the testing methods done to diagnose heart disease and the outcomes of the test results. Describes the treatment choices: Medical Management, Angioplasty, or Coronary Bypass Surgery for treating various heart conditions including Myocardial Infarction, Angina, Valve Disease, and Cardiac Arrhythmias.  Written material given at graduation.   Medication Safety: - Group verbal and visual instruction to review commonly prescribed medications for heart and lung disease. Reviews the medication, class of the drug, and side effects. Includes the steps to properly store meds and maintain the prescription regimen.  Written material given at graduation.   Other: -Provides group and verbal instruction on various topics (see comments)   Knowledge Questionnaire Score:  Knowledge Questionnaire Score - 08/12/21 1532       Knowledge Questionnaire Score   Pre Score 15/18              Core Components/Risk Factors/Patient Goals at Admission:  Personal Goals and Risk Factors at Admission - 08/12/21 1538       Core Components/Risk Factors/Patient Goals on Admission    Weight Management Yes;Weight Loss    Intervention  Weight Management: Develop a combined nutrition and exercise program  designed to reach desired caloric intake, while maintaining appropriate intake of nutrient and fiber, sodium and fats, and appropriate energy expenditure required for the weight goal.;Weight Management: Provide education and appropriate resources to help participant work on and attain dietary goals.;Weight Management/Obesity: Establish reasonable short term and long term weight goals.    Admit Weight 172 lb 11.2 oz (78.3 kg)    Goal Weight: Short Term 175 lb (79.4 kg)    Goal Weight: Long Term 170 lb (77.1 kg)    Expected Outcomes Short Term: Continue to assess and modify interventions until short term weight is achieved;Long Term: Adherence to nutrition and physical activity/exercise program aimed toward attainment of established weight goal;Weight Loss: Understanding of general recommendations for a balanced deficit meal plan, which promotes 1-2 lb weight loss per week and includes a negative energy balance of [phone] kcal/d;Understanding recommendations for meals to include 15-35% energy as protein, 25-35% energy from fat, 35-60% energy from carbohydrates, less than 234m of dietary cholesterol, 20-35 gm of total fiber daily;Understanding of distribution of calorie intake throughout the day with the consumption of 4-5 meals/snacks    Improve shortness of breath with ADL's Yes    Intervention Provide education, individualized exercise plan and daily activity instruction to help decrease symptoms of SOB with activities of daily living.    Expected Outcomes Short Term: Improve cardiorespiratory fitness to achieve a reduction of symptoms when performing ADLs;Long Term: Be able to perform more ADLs without symptoms or delay the onset of symptoms    Diabetes Yes    Intervention Provide education about signs/symptoms and action to take for hypo/hyperglycemia.;Provide education about proper nutrition, including hydration, and aerobic/resistive exercise prescription along with prescribed medications to achieve  blood glucose in normal ranges: Fasting glucose 65-99 mg/dL    Expected Outcomes Short Term: Participant verbalizes understanding of the signs/symptoms and immediate care of hyper/hypoglycemia, proper foot care and importance of medication, aerobic/resistive exercise and nutrition plan for blood glucose control.;Long Term: Attainment of HbA1C < 7%.    Hypertension Yes    Intervention Provide education on lifestyle modifcations including regular physical activity/exercise, weight management, moderate sodium restriction and increased consumption of fresh fruit, vegetables, and low fat dairy, alcohol moderation, and smoking cessation.;Monitor prescription use compliance.    Expected Outcomes Short Term: Continued assessment and intervention until BP is < 140/973mHG in hypertensive participants. < 130/8046mG in hypertensive participants with diabetes, heart failure or chronic kidney disease.;Long Term: Maintenance of blood pressure at goal levels.    Lipids Yes    Intervention Provide education and support for participant on nutrition & aerobic/resistive exercise along with prescribed medications to achieve LDL <34m33mDL >40mg18m Expected Outcomes Short Term: Participant states understanding of desired cholesterol values and is compliant with medications prescribed. Participant is following exercise prescription and nutrition guidelines.;Long Term: Cholesterol controlled with medications as prescribed, with individualized exercise RX and with personalized nutrition plan. Value goals: LDL < 34mg,9m > 40 mg.             Education:Diabetes - Individual verbal and written instruction to review signs/symptoms of diabetes, desired ranges of glucose level fasting, after meals and with exercise. Acknowledge that pre and post exercise glucose checks will be done for 3 sessions at entry of program. Flowsheet Row [facility]  Date 07/08/21  Educator  [facility] Review Code 1- Verbalizes Understanding  Know Your Numbers and Heart Failure: - Group verbal and visual instruction to discuss disease risk factors for cardiac and pulmonary disease and treatment options.  Reviews associated critical values for Overweight/Obesity, Hypertension, Cholesterol, and Diabetes.  Discusses basics of heart failure: signs/symptoms and treatments.  Introduces Heart Failure Zone chart for action plan for heart failure.  Written material given at graduation.   Core Components/Risk Factors/Patient Goals Review:   Goals and Risk Factor Review     Row Name 09/08/21 1341 09/29/21 1348 10/20/21 1347 12/27/21 1350 01/19/22 1350     Core Components/Risk Factors/Patient Goals Review   Personal Goals Review Improve shortness of breath with ADL's Improve shortness of breath with ADL's;Diabetes;Weight Management/Obesity;Hypertension Improve shortness of breath with ADL's;Diabetes;Hypertension Improve shortness of breath with ADL's;Diabetes;Hypertension;Weight Management/Obesity;Increase knowledge of respiratory medications and ability to use respiratory devices properly. Weight Management/Obesity;Hypertension   Review Spoke to patient about their shortness of breath and what they can do to improve. Patient has been informed of breathing techniques when starting the program. Patient is informed to tell staff if they have had any med changes and that certain meds they are taking or not taking can be causing shortness of breath. Louie Casa has been checking his sugars, ranging at 150 high and 100 low which he says is doctor's goal. He is taking all his medications as prescribed. He is trying to lose weight but realized he hasn't lost any  when he ate cracker jacks but wasn't looking at the serving size and has been eating the entire package. He needs to get a BP cuff for home and plans to start checking it at home once he gets it. BP at rehab have been stable. He hasn't seen  much difference with his breathing since starting but hopes that will change once he gets a little more into the program. Louie Casa did get a BP cuff for home and is checking at home.  He had one episode of low BP at home.    Staff advised to call his Dr if this happens again.  He drank water and had salty crackers to get BP back to normal.  His BG has been as low as 80 fasting in the morning.  Highest has been 150.  We reviewed symptoms of low BG and he said the symtpoms were a lot of symtpoms he has anyway.  He cant say any specifics of improving breathing. Louie Casa is doing well back in rehab.  His weight is holding steady around 170 lb.  His is still having some lower BP readings in the 90s.  His sugars are doing well and he continues to check them.  His A1c is down to 5.2!!  He continues to struggle with his beathing, but using his PLB.  He continues to use his inhaler and nebulizers. Louie Casa has no concerns of his SOB, medications or BP. He is going to continue exercise and maintain his health they best he can.   Expected Outcomes Short: Attend [facility] regularly to improve shortness of breath with ADL's. Long: maintain independence with ADL's Short: Continue working on weight loss and look at serving size Long: Continue to manage lifestyle risk factors Short:  continue to monitor BG/BP at home Long: maintain risk factors at optimal levels Short: Continue to keep close eye on BP Long: conitnue to manage lung disease Short: attened [facility]. Long: maintain a workout rountine post [facility].            Core Components/Risk Factors/Patient Goals at Discharge (Final Review):  Goals and Risk Factor Review - 01/19/22 1350       Core Components/Risk Factors/Patient Goals Review   Personal Goals Review Weight Management/Obesity;Hypertension    Review Louie Casa has no concerns of his SOB, medications or BP. He is going to continue exercise and maintain his health they best he can.    Expected Outcomes Short:  attened [facility]. Long: maintain a workout rountine post [facility].             ITP Comments:  ITP Comments     Row Name 08/12/21 1542 08/18/21 0833 08/30/21 1336 09/15/21 0916 10/04/21 1531   ITP Comments Completed 6MWT and gym orientation. Initial ITP created and sent for review to Dr. Ottie Glazier, Medical Director. 30 Day review completed. Medical Director ITP review done, changes made as directed, and signed approval by Medical Director. First full day of exercise!  Patient was oriented to gym and equipment including functions, settings, policies, and procedures.  Patient's individual exercise prescription and treatment plan were reviewed.  All starting workloads were established based on the results of the 6 minute walk test done at initial orientation visit.  The plan for exercise progression was also introduced and progression will be customized based on patient's performance and goals. 30 Day review completed. Medical Director ITP review done, changes made as directed, and signed approval by Medical Director.   new to program Completed initial RD consultation    [place] Name 10/13/21 0912 11/10/21 0905 12/02/21 1313 12/07/21 0730 12/08/21 1406   ITP Comments 30 Day review completed. Medical Director ITP review done, changes made as directed, and signed approval by Medical Director. 30 Day review completed. Medical Director ITP review done, changes made as directed, and signed approval by Medical Director. [name] called to inform staff that Randy's CT is in the process of being scheduled. 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 His wife stated she would call with an update and if he can return. Christiano has missed sessions due to having other medical appointments.  Staff will follow up when he returns. 30 Day review completed. Medical Director ITP review done, changes made as directed, and signed approval by Medical Director.    [place] Name  01/05/22 0914 02/02/22 1345         ITP Comments 30 Day review completed. Medical Director ITP review done, changes made as directed, and signed approval by Medical Director. 30 Day review completed. Medical Director ITP review done, changes made as directed, and signed approval by Medical Director.               Comments:

## 2022-02-07 DIAGNOSIS — J8410 Pulmonary fibrosis, unspecified: Secondary | ICD-10-CM

## 2022-02-07 NOTE — Progress Notes (Signed)
Daily Session Note  Patient Details  Name: Kirk Robinson MRN: [id number] Date of Birth: 1955-09-23 Referring Provider:   Flowsheet Row Pulmonary Rehab from 08/12/2021 in [name] Cardiac and Pulmonary Rehab  Referring Provider Raul Del       Encounter Date: 02/07/2022  Check In:  Session Check In - 02/07/22 1329       Check-In   Supervising physician immediately available to respond to emergencies See telemetry face sheet for immediately available ER MD    Location [facility]-Cardiac & Pulmonary Rehab    Staff Present Birdie Sons, MPA, RN;Joseph Commerce, RCP,RRT,BSRT;Fabrizzio Marcella Hastings, BS, ACSM CEP, Exercise Physiologist    Virtual Visit No    Medication changes reported     No    Fall or balance concerns reported    No    Tobacco Cessation No Change    Warm-up and Cool-down Performed on first and last piece of equipment    Resistance Training Performed Yes    VAD Patient? No    PAD/SET Patient? No      Pain Assessment   Currently in Pain? No/denies                Social History   Tobacco Use  Smoking Status Former   Packs/day: 1.00   Years: 30.00   Total pack years: 30.00   Types: Cigarettes   Quit date: 08/22/2020   Years since quitting: 1.4  Smokeless Tobacco Never    Goals Met:  Independence with exercise equipment Exercise tolerated well No report of concerns or symptoms today Strength training completed today  Goals Unmet:  Not Applicable  Comments: Pt able to follow exercise prescription today without complaint.  Will continue to monitor for progression.    Dr. Emily Filbert is Medical Director for [place].  Dr. Ottie Glazier is Medical Director for [facility] Pulmonary Rehabilitation.

## 2022-02-09 DIAGNOSIS — J8410 Pulmonary fibrosis, unspecified: Secondary | ICD-10-CM | POA: Diagnosis not present

## 2022-02-09 NOTE — Progress Notes (Signed)
Daily Session Note  Patient Details  Name: Kirk Robinson MRN: [id number] Date of Birth: 1955-10-25 Referring Provider:   Flowsheet Row Pulmonary Rehab from 08/12/2021 in [facility] Cardiac and Pulmonary Rehab  Referring Provider Raul Del       Encounter Date: 02/09/2022  Check In:  Session Check In - 02/09/22 1322       Check-In   Supervising physician immediately available to respond to emergencies See telemetry face sheet for immediately available ER MD    Location [facility]-Cardiac & Pulmonary Rehab    Staff Present Justin Mend, Fabio Neighbors, MPA, RN    Virtual Visit No    Medication changes reported     No    Fall or balance concerns reported    No    Tobacco Cessation No Change    Warm-up and Cool-down Performed on first and last piece of equipment    Resistance Training Performed Yes    VAD Patient? No    PAD/SET Patient? No      Pain Assessment   Currently in Pain? No/denies                Social History   Tobacco Use  Smoking Status Former   Packs/day: 1.00   Years: 30.00   Total pack years: 30.00   Types: Cigarettes   Quit date: 08/22/2020   Years since quitting: 1.4  Smokeless Tobacco Never    Goals Met:  Independence with exercise equipment Exercise tolerated well No report of concerns or symptoms today Strength training completed today  Goals Unmet:  Not Applicable  Comments: Pt able to follow exercise prescription today without complaint.  Will continue to monitor for progression.    Dr. Emily Filbert is Medical Director for [place].  Dr. Ottie Glazier is Medical Director for [facility] Pulmonary Rehabilitation.

## 2022-02-14 DIAGNOSIS — J8410 Pulmonary fibrosis, unspecified: Secondary | ICD-10-CM | POA: Diagnosis not present

## 2022-02-14 NOTE — Progress Notes (Signed)
Daily Session Note  Patient Details  Name: Kirk Robinson MRN: [id number] Date of Birth: [birth year] Referring Provider:   Flowsheet Row Pulmonary Rehab from 08/12/2021 in [facility]  Referring Provider Meredeth Ide       Encounter Date: 02/14/2022  Check In:  Session Check In - 02/14/22 1348       Check-In   Supervising physician immediately available to respond to emergencies See telemetry face sheet for immediately available ER MD    Location [facility]    Staff Present Ronette Deter, BS, Exercise Physiologist;Tama Grosz [place], MPA, RN;Joseph Higinio Roger, MS, ASCM CEP, Exercise Physiologist    Virtual Visit No    Medication changes reported     No    Fall or balance concerns reported    No    Tobacco Cessation No Change    Warm-up and Cool-down Performed on first and last piece of equipment    Resistance Training Performed Yes    VAD Patient? No    PAD/SET Patient? No      Pain Assessment   Currently in Pain? No/denies                Social History   Tobacco Use  Smoking Status Former   Packs/day: 1.00   Years: 30.00   Total pack years: 30.00   Types: Cigarettes   Quit date: 08/22/2020   Years since quitting: 1.4  Smokeless Tobacco Never    Goals Met:  Independence with exercise equipment Exercise tolerated well No report of concerns or symptoms today Strength training completed today  Goals Unmet:  Not Applicable  Comments:  Aarib graduated today from  rehab with 36 sessions completed.  Details of the patient's exercise prescription and what He needs to do in order to continue the prescription and progress were discussed with patient.  Patient was given a copy of prescription and goals.  Patient verbalized understanding.  Burnett plans to continue to exercise by walking at home.     Dr. Bethann Punches is Medical Director for [facility] Card[facility].  Dr. Vida Rigger is Medical  Director for [facility] Pulmonary Rehabilitation.

## 2022-03-04 ENCOUNTER — Telehealth: Payer: Self-pay

## 2022-03-04 NOTE — Telephone Encounter (Signed)
Patient is a Dr. Vicente Males patient. We received a referral for Nausea and Epigastric pain. He states he wants to see Dr. Allen Norris and Dr. Vicente Males please advised if patient can transfer his care to Dr. Allen Norris

## 2022-03-07 NOTE — Telephone Encounter (Signed)
Can you please see where you can place this patient he wants to see Dr. Allen Norris

## 2022-03-08 ENCOUNTER — Other Ambulatory Visit: Payer: Self-pay | Admitting: Family Medicine

## 2022-03-08 DIAGNOSIS — R1013 Epigastric pain: Secondary | ICD-10-CM

## 2022-03-08 DIAGNOSIS — R110 Nausea: Secondary | ICD-10-CM

## 2022-03-08 DIAGNOSIS — E119 Type 2 diabetes mellitus without complications: Secondary | ICD-10-CM

## 2022-03-16 ENCOUNTER — Ambulatory Visit: Admission: RE | Admit: 2022-03-16 | Payer: Medicare HMO | Source: Ambulatory Visit

## 2022-03-23 ENCOUNTER — Ambulatory Visit
Admission: RE | Admit: 2022-03-23 | Discharge: 2022-03-23 | Disposition: A | Discharge status: Home / Self Care | Payer: Medicare HMO | Source: Ambulatory Visit | Attending: Family Medicine | Admitting: Family Medicine

## 2022-03-23 DIAGNOSIS — R110 Nausea: Secondary | ICD-10-CM | POA: Diagnosis present

## 2022-03-23 DIAGNOSIS — E119 Type 2 diabetes mellitus without complications: Secondary | ICD-10-CM | POA: Diagnosis present

## 2022-03-23 DIAGNOSIS — R1013 Epigastric pain: Secondary | ICD-10-CM | POA: Insufficient documentation

## 2022-03-24 NOTE — Telephone Encounter (Signed)
Appt scheduled with Dr Allen Norris

## 2022-05-12 ENCOUNTER — Encounter: Payer: Self-pay | Admitting: Gastroenterology

## 2022-05-12 ENCOUNTER — Ambulatory Visit: Payer: Medicare HMO | Admitting: Gastroenterology

## 2022-05-12 VITALS — BP 104/69 | HR 106 | Temp 97.6°F | Ht 68.0 in | Wt 153.0 lb

## 2022-05-12 DIAGNOSIS — Z86010 Personal history of colonic polyps: Secondary | ICD-10-CM | POA: Diagnosis not present

## 2022-05-12 MED ORDER — CLENPIQ 10-3.5-12 MG-GM -GM/160ML PO SOLN: 1.0000 | Freq: Once | ORAL | 0 refills | Status: AC

## 2022-05-12 NOTE — Progress Notes (Signed)
Primary Care Physician: [name], MD  Primary Gastroenterologist:  Dr. Lucilla Lame  Chief Complaint  Patient presents with   Transfer Care from Dr Vicente Males   Nausea   Abdominal Pain    HPI: Kirk Robinson is a 66 y.o. male here this patient comes in today after being seen in the past by Dr. Vicente Males for anemia.  The patient had an upper endoscopy and colonoscopy with the addition of a capsule endoscopy without any source found.  The patient has had recent blood work that showed him to be anemic with normal iron studies.  The patient then reported to his primary care provider that he was having nausea and vomiting and was sent to see me.  The patient was also endorsing epigastric pain.  The patient underwent a CT scan of the abdomen back in August that showed:  IMPRESSION: Mild colonic constipation. No other acute abnormality is noted   When the patient was seen in the past the patient had a ferritin of 7 and a low MCV.  At the most recent appointment his primary care provider's office it was noted that the patient has intermittent severe upper abdominal pain with severe nausea.  He has persistent constipation and uses glycerin suppositories every 2 days and is taking Colace 3 times a day and has a bowel movement every couple of days. The patient reports that he takes pain medication and has had chronic constipation.  The patient reports that his first colonoscopy was canceled after he took the prep due to a water issue at the hospital.  The patient then reports that he had a poor prep and was recommended to have a repeat colonoscopy but was given the same prep therefore was not cleaned out for his colonoscopy.  He was quite frustrated with not getting answers.  The patient then decided to come see me today.  Past Medical History:  Diagnosis Date   Alcohol use 03/16/2007   patient risk factors   Allergic rhinitis, cause unspecified    Altered mental status    Anxiety state, unspecified     Cervicalgia 09/17/2007   COPD (chronic obstructive pulmonary disease) (HCC)    Diabetes mellitus without complication ([place])    Diplopia 12/21/2007   Esophageal reflux    Gastritis 05/13/2009   HCAP (healthcare-associated pneumonia) 08/12/2014   Hematuria 03/16/2007   Hyperthyroidism    graves disease   Iodine hypothyroidism    Memory loss    NSIP (nonspecific interstitial pneumonia) ([place])    on azathioprine   Osteoporosis, unspecified    bone density per Morivati in 2012   Other abnormal glucose 04/08/2008   Other diseases of lung, not elsewhere classified 01/23/2009   s/p Fleming/pulmonology consult; intolerant  to Advair   Peripheral vascular disease, unspecified ([place]) 12/24/2009   Personal history of colonic polyps    Pneumonia, organism unspecified(486)    Pure hypercholesterolemia    Shortness of breath    Stroke ([place]) 2005   Tobacco use disorder 03/16/2007   patient risk factors   Unspecified hypothyroidism 01/07/2010   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     albuterol (VENTOLIN HFA) 108 (90 Base) MCG/ACT inhaler Inhale 2 puffs into the lungs every 6 (six) hours as needed for wheezing or shortness of breath. 18 g 0   alendronate (FOSAMAX) 70 MG tablet Take by mouth.  ARIPiprazole (ABILIFY) 2 MG tablet Take 2 mg by mouth daily.     ARMOUR THYROID 120 MG tablet Take 120 mg by mouth every morning.     ASPIRIN LOW DOSE 81 MG EC tablet TAKE 1 TABLET BY MOUTH EVERY DAY 90 tablet 4   atorvastatin (LIPITOR) 20 MG tablet Take 1 tablet (20 mg total) by mouth daily at 6 PM. 90 tablet 3   Blood Glucose Monitoring Suppl (ACCU-CHEK GUIDE) w/Device KIT See admin instructions.     BREZTRI AEROSPHERE 160-9-4.8 MCG/ACT AERO SMARTSIG:2 Inhalation Via Inhaler Twice Daily     clobetasol ointment (TEMOVATE) 0.05 % Apply topically 2  (two) times daily.     clopidogrel (PLAVIX) 75 MG tablet TAKE 1 TABLET BY MOUTH EVERY DAY 90 tablet 3   diazepam (VALIUM) 5 MG tablet Take 5 mg by mouth 2 (two) times daily.      diazepam (VALIUM) 5 MG tablet Take by mouth.     diltiazem (CARDIZEM CD) 120 MG 24 hr capsule Take by mouth daily.     Docusate Sodium 100 MG capsule Take 1 tablet by mouth in the morning, at noon, and at bedtime.     Ensure Max Protein (ENSURE MAX PROTEIN) LIQD Take 330 mLs (11 oz total) by mouth 2 (two) times daily. 330 mL 0   ESBRIET 267 MG TABS Take 3 tablets by mouth 3 (three) times daily as needed.     glucose blood test strip See admin instructions.     hydrocortisone 2.5 % ointment Apply topically 2 (two) times daily.     linagliptin (TRADJENTA) 5 MG TABS tablet Take 1 tablet (5 mg total) by mouth daily. 30 tablet 0   metFORMIN (GLUCOPHAGE) 1000 MG tablet TAKE 1 TABLET BY MOUTH 2 TIMES DAILY WITH MEAL. (Patient taking differently: Take 1,000 mg by mouth 2 (two) times daily with a meal.) 180 tablet 0   metoprolol succinate (TOPROL-XL) 25 MG 24 hr tablet Take 1 tablet by mouth daily.     mirtazapine (REMERON) 30 MG tablet Take 30 mg by mouth at bedtime.     morphine (MS CONTIN) 30 MG 12 hr tablet SMARTSIG:1 Tablet(s) By Mouth Every 12 Hours     naloxone (NARCAN) nasal spray 4 mg/0.1 mL Place into the nose.     Pirfenidone 267 MG TABS Take by mouth.     polyethylene glycol (MIRALAX / GLYCOLAX) 17 g packet Take 17 g by mouth daily. 30 each 0   polyethylene glycol-electrolytes (NULYTELY) 420 g solution Prepare according to package instructions. Starting at 5:00 PM: Drink one 8 oz glass of mixture every 15 minutes until you finish half of the jug. Five hours prior to procedure, drink 8 oz glass of mixture every 15 minutes until it is all gone. Make sure you do not drink anything 4 hours prior to your procedure. 8000 mL 0   predniSONE (DELTASONE) 10 MG tablet Take 20 mg by mouth daily with breakfast.     predniSONE  (DELTASONE) 10 MG tablet Take 1 tablet by mouth daily.     tapentadol HCl (NUCYNTA) 75 MG tablet Take 75 mg by mouth every 4 (four) hours as needed for moderate pain or severe pain.     thiamine 100 MG tablet Take 1 tablet (100 mg total) by mouth daily. 30 tablet 0   venlafaxine XR (EFFEXOR-XR) 150 MG 24 hr capsule Take 1 capsule (150 mg total) by mouth daily with breakfast. 90 capsule 1   Vitamin D, Ergocalciferol, (DRISDOL)  1.25 MG (50000 UNIT) CAPS capsule Take by mouth.     famotidine (PEPCID) 20 MG tablet Take by mouth.     metoprolol succinate (TOPROL-XL) 25 MG 24 hr tablet Take by mouth.     mirtazapine (REMERON) 15 MG tablet Take by mouth.     rOPINIRole (REQUIP) 0.5 MG tablet Take by mouth.     sitaGLIPtin (JANUVIA) 100 MG tablet Take by mouth.     No current facility-administered medications for this visit.    Allergies as of 05/12/2022 - Review Complete 05/12/2022  Allergen Reaction Noted   Advair diskus [fluticasone-salmeterol] Shortness Of Breath 04/04/2012   Erythromycin Nausea And Vomiting 03/16/2007   Iodinated contrast media Shortness Of Breath and Other (See Comments) 04/08/2014   Ativan [lorazepam] Other (See Comments) 07/12/2016   Darvocet [propoxyphene n-acetaminophen] Nausea And Vomiting 04/16/2012   Doxycycline hyclate Nausea And Vomiting, Swelling, and Other (See Comments) 04/04/2012   Propoxyphene Nausea And Vomiting 04/16/2012   Septra [sulfamethoxazole-trimethoprim] Rash 04/16/2012    ROS:  General: Negative for anorexia, weight loss, fever, chills, fatigue, weakness. ENT: Negative for hoarseness, difficulty swallowing , nasal congestion. CV: Negative for chest pain, angina, palpitations, dyspnea on exertion, peripheral edema.  Respiratory: Negative for dyspnea at rest, dyspnea on exertion, cough, sputum, wheezing.  GI: See history of present illness. GU:  Negative for dysuria, hematuria, urinary incontinence, urinary frequency, nocturnal urination.  Endo:  Negative for unusual weight change.    Physical Examination:   BP 104/69   Pulse (!) 106   Temp 97.6 F (36.4 C) (Oral)   Ht '5\' 8"'  (1.727 m)   Wt 153 lb (69.4 kg)   BMI 23.26 kg/m   General: Well-nourished, well-developed in no acute distress.  Eyes: No icterus. Conjunctivae pink. Neuro: Alert and oriented x 3.  Grossly intact. Skin: Warm and dry, no jaundice.   Psych: Alert and cooperative, normal mood and affect.  Labs:    Imaging Studies: No results found.  Assessment and Plan:   Kirk Robinson is a 66 y.o. y/o male who comes in today with a history of being on pain medication with resulting constipation. The patient has been given samples of Linzess 290 g to be taken once a day to help with his bowel movements.  The patient has been told to titrate his other medications so that he is having more frequent bowel movements.  The patient will be given a prolonged bowel prep before his next colonoscopy so that he is better cleaned out.  The patient did have polyps at his previous colonoscopy.  The patient will be set up for a repeat colonoscopy and we'll follow-up at that time.  The patient has been explained the plan and agrees with it.     Lucilla Lame, MD. Marval Regal    Note: This dictation was prepared with Dragon dictation along with smaller phrase technology. Any transcriptional errors that result from this process are unintentional.

## 2022-05-12 NOTE — Patient Instructions (Addendum)
Linzess 215mg samples given, you will take this 30 minutes before breakfast.  You will continue all other laxative medications  If you do not have bowel movement in 4-5 days, start taking Linzess with food.  If you start to experience diarrhea, hold your Linzess and just continue current laxative medications  1 week prior to procedure, reach out to me to let me know if bowel movements are regular.  Stop Plavix 5 days prior to procedure and restart 2 days after

## 2022-05-29 ENCOUNTER — Other Ambulatory Visit (INDEPENDENT_AMBULATORY_CARE_PROVIDER_SITE_OTHER): Payer: Self-pay | Admitting: Vascular Surgery

## 2022-06-09 ENCOUNTER — Telehealth: Payer: Self-pay

## 2022-06-09 NOTE — Telephone Encounter (Signed)
Has been faxed to Dr Lucky Cowboy x 2

## 2022-06-13 ENCOUNTER — Other Ambulatory Visit: Payer: Self-pay

## 2022-06-13 MED ORDER — LINACLOTIDE 290 MCG PO CAPS: 290.0000 ug | ORAL_CAPSULE | Freq: Every day | ORAL | 6 refills | Status: DC

## 2022-06-15 ENCOUNTER — Telehealth: Payer: Self-pay

## 2022-06-15 NOTE — Telephone Encounter (Signed)
Pt's spouse called to report pt had 3 days w/o a BM as he ran out of Linzess x 3 days... Wife [name] said that he is still currently having issues with constipation and has restarted Linzess

## 2022-06-16 NOTE — Telephone Encounter (Signed)
Received fax from Dr Bunnie Domino office, pt must stop Plavix 5 days prior and restart 1 day after.... Spouse, [name], is aware and expressed understanding

## 2022-06-16 NOTE — Telephone Encounter (Signed)
Faxed clearance x 2.... Called left msg on nurse line regarding blood thinner clearance

## 2022-06-16 NOTE — Telephone Encounter (Signed)
I spoke to pt and spouse... They report that pt has resumed having BM since restarting the Linzess... Advised to continue as previously instructed and proceed with regular prep day before procedure.... They both expressed understanding

## 2022-06-21 ENCOUNTER — Encounter: Payer: Self-pay | Admitting: Gastroenterology

## 2022-06-21 ENCOUNTER — Telehealth: Payer: Self-pay

## 2022-06-21 MED ORDER — GOLYTELY 236 G PO SOLR: 4000.0000 mL | Freq: Once | ORAL | 0 refills | Status: AC

## 2022-06-21 MED ORDER — GOLYTELY 236 G PO SOLR: 4000.0000 mL | Freq: Once | ORAL | 0 refills | Status: DC

## 2022-06-21 NOTE — Telephone Encounter (Signed)
Pharmacy stated they did not have Rx sent earlier... Rx sent through e-scribe

## 2022-06-21 NOTE — Telephone Encounter (Signed)
Kirk Robinson was scheduled with Dr Allen Norris today for Colonoscopy, but he has called and stated he was not clear after his prep and has been rescheduled (with Dr Dorothey Baseman approval) for Wednesday, November 1 with Dr Marius Ditch (hoping she is here tomorrow).  I have done the reschedule in the computer and talked to his wife, but he needs another prep called in.    Patient has been contacted and informed that Golytely bowel prep will be sent to the [facility] [place].  Instructions provided as follows-Fill Golytely prep to the fill line with clear liquid.  Mix well.  Drink 8 oz every 30  mins until entire contents have been completed.  Do not eat or drink anything 4 hours prior to colonoscopy.  Thanks, Kirk Robinson, [place]

## 2022-06-21 NOTE — Addendum Note (Signed)
Addended by: Lurlean Nanny on: 06/21/2022 11:48 AM   Modules accepted: Orders

## 2022-06-22 ENCOUNTER — Encounter
Admission: RE | Disposition: A | Discharge status: Home / Self Care | Payer: Self-pay | Source: Home / Self Care | Attending: Gastroenterology

## 2022-06-22 ENCOUNTER — Ambulatory Visit
Admission: RE | Admit: 2022-06-22 | Discharge: 2022-06-22 | Disposition: A | Discharge status: Home / Self Care | Payer: Medicare HMO | Attending: Gastroenterology | Admitting: Gastroenterology

## 2022-06-22 ENCOUNTER — Ambulatory Visit: Payer: Medicare HMO

## 2022-06-22 ENCOUNTER — Encounter: Payer: Self-pay | Admitting: Gastroenterology

## 2022-06-22 DIAGNOSIS — Z7984 Long term (current) use of oral hypoglycemic drugs: Secondary | ICD-10-CM | POA: Diagnosis not present

## 2022-06-22 DIAGNOSIS — K219 Gastro-esophageal reflux disease without esophagitis: Secondary | ICD-10-CM | POA: Diagnosis not present

## 2022-06-22 DIAGNOSIS — F419 Anxiety disorder, unspecified: Secondary | ICD-10-CM | POA: Insufficient documentation

## 2022-06-22 DIAGNOSIS — E7800 Pure hypercholesterolemia, unspecified: Secondary | ICD-10-CM | POA: Diagnosis not present

## 2022-06-22 DIAGNOSIS — Z1211 Encounter for screening for malignant neoplasm of colon: Secondary | ICD-10-CM | POA: Diagnosis present

## 2022-06-22 DIAGNOSIS — D120 Benign neoplasm of cecum: Secondary | ICD-10-CM | POA: Diagnosis not present

## 2022-06-22 DIAGNOSIS — I6930 Unspecified sequelae of cerebral infarction: Secondary | ICD-10-CM | POA: Insufficient documentation

## 2022-06-22 DIAGNOSIS — Z87891 Personal history of nicotine dependence: Secondary | ICD-10-CM | POA: Insufficient documentation

## 2022-06-22 DIAGNOSIS — D126 Benign neoplasm of colon, unspecified: Secondary | ICD-10-CM

## 2022-06-22 DIAGNOSIS — K5730 Diverticulosis of large intestine without perforation or abscess without bleeding: Secondary | ICD-10-CM | POA: Diagnosis not present

## 2022-06-22 DIAGNOSIS — Z9981 Dependence on supplemental oxygen: Secondary | ICD-10-CM | POA: Insufficient documentation

## 2022-06-22 DIAGNOSIS — E890 Postprocedural hypothyroidism: Secondary | ICD-10-CM | POA: Insufficient documentation

## 2022-06-22 DIAGNOSIS — E1151 Type 2 diabetes mellitus with diabetic peripheral angiopathy without gangrene: Secondary | ICD-10-CM | POA: Insufficient documentation

## 2022-06-22 DIAGNOSIS — M810 Age-related osteoporosis without current pathological fracture: Secondary | ICD-10-CM | POA: Diagnosis not present

## 2022-06-22 DIAGNOSIS — J449 Chronic obstructive pulmonary disease, unspecified: Secondary | ICD-10-CM | POA: Insufficient documentation

## 2022-06-22 DIAGNOSIS — Z86010 Personal history of colonic polyps: Secondary | ICD-10-CM

## 2022-06-22 HISTORY — PX: COLONOSCOPY WITH PROPOFOL: SHX5780

## 2022-06-22 LAB — GLUCOSE, CAPILLARY: Glucose-Capillary: 83 mg/dL (ref 70–99)

## 2022-06-22 SURGERY — COLONOSCOPY WITH PROPOFOL
Anesthesia: General

## 2022-06-22 MED ORDER — PROPOFOL 500 MG/50ML IV EMUL
INTRAVENOUS | Status: DC | PRN
  Administered 2022-06-22: 125 ug/kg/min via INTRAVENOUS

## 2022-06-22 MED ORDER — LIDOCAINE HCL (CARDIAC) PF 100 MG/5ML IV SOSY
PREFILLED_SYRINGE | INTRAVENOUS | Status: DC | PRN
  Administered 2022-06-22: 50 mg via INTRAVENOUS

## 2022-06-22 MED ORDER — PROPOFOL 10 MG/ML IV BOLUS
INTRAVENOUS | Status: DC | PRN
  Administered 2022-06-22: 60 mg via INTRAVENOUS

## 2022-06-22 MED ORDER — SODIUM CHLORIDE 0.9 % IV SOLN: INTRAVENOUS | Status: DC

## 2022-06-22 MED ORDER — PHENYLEPHRINE HCL (PRESSORS) 10 MG/ML IV SOLN
INTRAVENOUS | Status: DC | PRN
  Administered 2022-06-22 (×3): 80 ug via INTRAVENOUS

## 2022-06-22 NOTE — Anesthesia Postprocedure Evaluation (Signed)
Anesthesia Post Note  Patient: Kirk Robinson  Procedure(s) Performed: COLONOSCOPY WITH PROPOFOL  Patient location during evaluation: Endoscopy Anesthesia Type: General Level of consciousness: awake and alert Pain management: pain level controlled Vital Signs Assessment: post-procedure vital signs reviewed and stable Respiratory status: spontaneous breathing, nonlabored ventilation, respiratory function stable and patient connected to nasal cannula oxygen Cardiovascular status: blood pressure returned to baseline and stable Postop Assessment: no apparent nausea or vomiting Anesthetic complications: no   No notable events documented.   Last Vitals:  Vitals:   06/22/22 1121 06/22/22 1131  BP: (!) 106/58 103/70  Pulse: 66 61  Resp: 18 12  Temp:    SpO2: 100% 100%    Last Pain:  Vitals:   06/22/22 1131  TempSrc:   PainSc: 7                  Precious Haws [place]

## 2022-06-22 NOTE — Anesthesia Preprocedure Evaluation (Signed)
Anesthesia Evaluation  Patient identified by MRN, date of birth, ID band Patient awake    Reviewed: Allergy & Precautions, NPO status , Patient's Chart, lab work & pertinent test results  History of Anesthesia Complications Negative for: history of anesthetic complications  Airway Mallampati: III  TM Distance: <3 FB Neck ROM: full    Dental  (+) Chipped, Poor Dentition, Missing   Pulmonary shortness of breath and with exertion, COPD (3 L [place]),  COPD inhaler and oxygen dependent, former smoker,    Pulmonary exam normal        Cardiovascular Exercise Tolerance: Poor (-) anginanegative cardio ROS Normal cardiovascular exam     Neuro/Psych PSYCHIATRIC DISORDERS CVA, Residual Symptoms    GI/Hepatic Neg liver ROS, GERD  Controlled,  Endo/Other  diabetes, Type 2  Renal/GU negative Renal ROS  negative genitourinary   Musculoskeletal   Abdominal   Peds  Hematology negative hematology ROS (+)   Anesthesia Other Findings Past Medical History: 03/16/2007: Alcohol use     Comment:  patient risk factors No date: Allergic rhinitis, cause unspecified No date: Altered mental status No date: Anxiety state, unspecified 09/17/2007: Cervicalgia No date: COPD (chronic obstructive pulmonary disease) (HCC) No date: Diabetes mellitus without complication ([place]) 82/99/3716: Diplopia No date: Esophageal reflux 05/13/2009: Gastritis 08/12/2014: HCAP (healthcare-associated pneumonia) 03/16/2007: Hematuria No date: Hyperthyroidism     Comment:  graves disease No date: Iodine hypothyroidism No date: Memory loss No date: NSIP (nonspecific interstitial pneumonia) ([place])     Comment:  on azathioprine No date: Osteoporosis, unspecified     Comment:  bone density per Morivati in 2012 04/08/2008: Other abnormal glucose 01/23/2009: Other diseases of lung, not elsewhere classified     Comment:  s/p Fleming/pulmonology consult; intolerant  to  Advair 12/24/2009: Peripheral vascular disease, unspecified ([place]) No date: Personal history of colonic polyps No date: Pneumonia, organism unspecified(486) No date: Pure hypercholesterolemia No date: Shortness of breath 2005: Stroke ([place]) 03/16/2007: Tobacco use disorder     Comment:  patient risk factors 01/07/2010: Unspecified hypothyroidism 06/08/2007: Unspecified late effects of cerebrovascular disease  Past Surgical History: No date: Admission 11/2011     Comment:  altered mental status/confusion with syncope.  CT head               negative, MRI brain negative, EEG: diffuse slowing but no              seizure acitivity.  Labs negative.  [facility]. 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 03/10/2020: COLONOSCOPY WITH PROPOFOL; N/A     Comment:  Procedure: COLONOSCOPY WITH PROPOFOL;  Surgeon: Jonathon Bellows, MD;  Location: [facility] ENDOSCOPY;  Service:               Gastroenterology;  Laterality: N/A; 03/16/2021: COLONOSCOPY WITH PROPOFOL; N/A     Comment:  Procedure: COLONOSCOPY WITH PROPOFOL;  Surgeon: Jonathon Bellows, MD;  Location: [facility] ENDOSCOPY;  Service:               Gastroenterology;  Laterality: N/A; 03/16/2021: ESOPHAGOGASTRODUODENOSCOPY (EGD) WITH PROPOFOL; N/A     Comment:  Procedure: ESOPHAGOGASTRODUODENOSCOPY (EGD) WITH               PROPOFOL;  Surgeon: Jonathon Bellows, MD;  Location: [facility]  ENDOSCOPY;  Service: Gastroenterology;  Laterality: N/A; 2010: EYE SURGERY 04/16/2021: GIVENS CAPSULE STUDY; N/A     Comment:  Procedure: GIVENS CAPSULE STUDY;  Surgeon: Jonathon Bellows,               MD;  Location: [facility] ENDOSCOPY;  Service:               Gastroenterology;  Laterality: N/A; 05/20/2014: LAPAROSCOPIC REVISION VENTRICULAR-PERITONEAL (V-P) SHUNT;  Right     Comment:  Procedure: LAPAROSCOPIC REVISION VENTRICULAR-PERITONEAL               (V-P) SHUNT;  Surgeon: Georganna Skeans, MD;  Location: [place]               NEURO ORS;  Service:  General;  Laterality: Right; 06/11/2020: LOWER EXTREMITY ANGIOGRAPHY; Left     Comment:  Procedure: LOWER EXTREMITY ANGIOGRAPHY;  Surgeon: Algernon Huxley, MD;  Location: [facility] CV LAB;  Service:               Cardiovascular;  Laterality: Left; 04/2012: Neuropsychiatric evaluation     Comment:  poor short-term memory, poor recall, delayed reaction               time; permanently disabled.    BMI    Body Mass Index: 22.15 kg/m      Reproductive/Obstetrics negative OB ROS                             Anesthesia Physical Anesthesia Plan  ASA: 4  Anesthesia Plan: General   Post-op Pain Management:    Induction: Intravenous  PONV Risk Score and Plan: Propofol infusion and TIVA  Airway Management Planned: Natural Airway and Nasal Cannula  Additional Equipment:   Intra-op Plan:   Post-operative Plan:   Informed Consent: I have reviewed the patients History and Physical, chart, labs and discussed the procedure including the risks, benefits and alternatives for the proposed anesthesia with the patient or authorized representative who has indicated his/her understanding and acceptance.     Dental Advisory Given  Plan Discussed with: Anesthesiologist, CRNA and Surgeon  Anesthesia Plan Comments: (Patient consented for risks of anesthesia including but not limited to:  - adverse reactions to medications - risk of airway placement if required - damage to eyes, teeth, lips or other oral mucosa - nerve damage due to positioning  - sore throat or hoarseness - Damage to heart, brain, nerves, lungs, other parts of body or loss of life  Patient voiced understanding.)        Anesthesia Quick Evaluation

## 2022-06-22 NOTE — Transfer of Care (Signed)
Immediate Anesthesia Transfer of Care Note  Patient: Kirk Robinson  Procedure(s) Performed: COLONOSCOPY WITH PROPOFOL  Patient Location: PACU and Endoscopy Unit  Anesthesia Type:General  Level of Consciousness: drowsy  Airway & Oxygen Therapy: Patient Spontanous Breathing  Post-op Assessment: Report given to RN  Post vital signs: Reviewed  Last Vitals:  Vitals Value Taken Time  BP 92/63 06/22/22 1111  Temp    Pulse    Resp 15 06/22/22 1111  SpO2 100 % 06/22/22 1111    Last Pain:  Vitals:   06/22/22 1111  TempSrc:   PainSc: Asleep         Complications: No notable events documented.

## 2022-06-22 NOTE — Op Note (Signed)
[facility] Patient Name: Kirk Robinson Procedure Date: 06/22/2022 10:40 AM MRN: [id number] Account #: [id number] Date of Birth: Dec 04, 1955 Admit Type: Outpatient Age: 66 Room: [facility] ENDO ROOM 3 Gender: Male Note Status: Finalized Instrument Name: Jasper Riling [id number] Procedure:             Colonoscopy Indications:           Screening for colorectal malignant neoplasm Providers:             Jonathon Bellows MD, MD Medicines:             Monitored Anesthesia Care Complications:         No immediate complications. Procedure:             Pre-Anesthesia Assessment:                        - Prior to the procedure, a History and Physical was                         performed, and patient medications, allergies and                         sensitivities were reviewed. The patient's tolerance                         of previous anesthesia was reviewed.                        - The risks and benefits of the procedure and the                         sedation options and risks were discussed with the                         patient. All questions were answered and informed                         consent was obtained.                        - ASA Grade Assessment: II - A patient with mild                         systemic disease.                        After obtaining informed consent, the colonoscope was                         passed under direct vision. Throughout the procedure,                         the patient's blood pressure, pulse, and oxygen                         saturations were monitored continuously. The                         Colonoscope was introduced through the anus and  advanced to the the cecum, identified by the                         appendiceal orifice. The colonoscopy was performed                         without difficulty. The patient tolerated the                         procedure well. The quality of the bowel  preparation                         was good. The appendiceal orifice was photographed. Findings:      The perianal and digital rectal examinations were normal.      A 5 mm polyp was found in the cecum. The polyp was sessile. The polyp       was removed with a cold snare. Resection and retrieval were complete.      Multiple medium-mouthed and small-mouthed diverticula were found in the       sigmoid colon.      The exam was otherwise without abnormality on direct and retroflexion       views. Impression:            - One 5 mm polyp in the cecum, removed with a cold                         snare. Resected and retrieved.                        - Diverticulosis in the sigmoid colon.                        - The examination was otherwise normal on direct and                         retroflexion views. Recommendation:        - Discharge patient to home (with escort).                        - Resume previous diet.                        - Continue present medications.                        - Await pathology results.                        - Repeat colonoscopy for surveillance based on                         pathology results. Procedure Code(s):     --- Professional ---                        [phone], Colonoscopy, flexible; with removal of                         tumor(s), polyp(s), or other lesion(s) by snare  technique Diagnosis Code(s):     --- Professional ---                        Z12.11, Encounter for screening for malignant neoplasm                         of colon                        D12.0, Benign neoplasm of cecum                        K57.30, Diverticulosis of large intestine without                         perforation or abscess without bleeding CPT copyright 2022 American [employer] Association. All rights reserved. The codes documented in this report are preliminary and upon coder review may  be revised to meet current compliance requirements. Jonathon Bellows, MD Jonathon Bellows MD, MD 06/22/2022 11:09:39 AM This report has been signed electronically. Number of Addenda: 0 Note Initiated On: 06/22/2022 10:40 AM Scope Withdrawal Time: 0 hours 16 minutes 0 seconds  Total Procedure Duration: 0 hours 22 minutes 40 seconds  Estimated Blood Loss:  Estimated blood loss: none.      [facility]

## 2022-06-22 NOTE — H&P (Signed)
Jonathon Bellows, MD [address], [place], [place], [place], [address] [address], [place], [place], [place], [address] Phone: [phone]  Fax: [phone]  Primary Care Physician:  [name], MD   Pre-Procedure History & Physical: HPI:  Kirk Robinson is a 66 y.o. male is here for an colonoscopy.   Past Medical History:  Diagnosis Date   Alcohol use 03/16/2007   patient risk factors   Allergic rhinitis, cause unspecified    Altered mental status    Anxiety state, unspecified    Cervicalgia 09/17/2007   COPD (chronic obstructive pulmonary disease) (HCC)    Diabetes mellitus without complication ([place])    Diplopia 12/21/2007   Esophageal reflux    Gastritis 05/13/2009   HCAP (healthcare-associated pneumonia) 08/12/2014   Hematuria 03/16/2007   Hyperthyroidism    graves disease   Iodine hypothyroidism    Memory loss    NSIP (nonspecific interstitial pneumonia) ([place])    on azathioprine   Osteoporosis, unspecified    bone density per [facility] in 2012   Other abnormal glucose 04/08/2008   Other diseases of lung, not elsewhere classified 01/23/2009   s/p Fleming/pulmonology consult; intolerant  to Advair   Peripheral vascular disease, unspecified ([place]) 12/24/2009   Personal history of colonic polyps    Pneumonia, organism unspecified([place])    Pure hypercholesterolemia    Shortness of breath    Stroke ([place]) 2005   Tobacco use disorder 03/16/2007   patient risk factors   Unspecified hypothyroidism 01/07/2010   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  CT head negative, MRI brain negative, EEG: diffuse slowing but no  seizure acitivity.  Labs negative.  [facility].   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   COLONOSCOPY WITH PROPOFOL N/A 03/10/2020   Procedure: COLONOSCOPY WITH PROPOFOL;  Surgeon: Jonathon Bellows,  MD;  Location: [facility] ENDOSCOPY;  Service: Gastroenterology;  Laterality: N/A;   COLONOSCOPY WITH PROPOFOL N/A 03/16/2021   Procedure: COLONOSCOPY WITH PROPOFOL;  Surgeon: Jonathon Bellows, MD;  Location: [facility] ENDOSCOPY;  Service: Gastroenterology;  Laterality: N/A;   ESOPHAGOGASTRODUODENOSCOPY (EGD) WITH PROPOFOL N/A 03/16/2021   Procedure: ESOPHAGOGASTRODUODENOSCOPY (EGD) WITH PROPOFOL;  Surgeon: Jonathon Bellows, MD;  Location: [facility] ENDOSCOPY;  Service: Gastroenterology;  Laterality: N/A;   EYE SURGERY  2010   GIVENS CAPSULE STUDY N/A 04/16/2021   Procedure: GIVENS CAPSULE STUDY;  Surgeon: Jonathon Bellows, MD;  Location: [facility] ENDOSCOPY;  Service: Gastroenterology;  Laterality: N/A;   LAPAROSCOPIC REVISION VENTRICULAR-PERITONEAL (V-P) SHUNT Right 05/20/2014   Procedure: LAPAROSCOPIC REVISION VENTRICULAR-PERITONEAL (V-P) SHUNT;  Surgeon: Georganna Skeans, MD;  Location: [place] NEURO ORS;  Service: General;  Laterality: Right;   LOWER EXTREMITY ANGIOGRAPHY Left 06/11/2020   Procedure: LOWER EXTREMITY ANGIOGRAPHY;  Surgeon: Algernon Huxley, MD;  Location: [place] CV LAB;  Service: Cardiovascular;  Laterality: Left;   Neuropsychiatric evaluation  04/2012   poor short-term memory, poor recall, delayed reaction time; permanently disabled.      Prior to Admission medications   Medication Sig Start Date End Date Taking? Authorizing Provider  albuterol (VENTOLIN HFA) 108 (90 Base) MCG/ACT inhaler Inhale 2 puffs into the lungs every 6 (six) hours as needed for wheezing or shortness of breath. 05/21/19  Yes Wieting, Richard, MD  ARIPiprazole (ABILIFY) 2 MG tablet Take 2 mg by mouth daily. 05/17/21  Yes [provider]  ASPIRIN  LOW DOSE 81 MG EC tablet TAKE 1 TABLET BY MOUTH EVERY DAY 08/31/21  Yes Dew, Erskine Squibb, MD  diazepam (VALIUM) 5 MG tablet Take by mouth. 05/26/21  Yes [provider]  diltiazem (CARDIZEM CD) 120 MG 24 hr capsule Take by mouth daily. 05/06/20  Yes [provider]  linagliptin  (TRADJENTA) 5 MG TABS tablet Take 1 tablet (5 mg total) by mouth daily. 05/21/19  Yes Wieting, Richard, MD  metFORMIN (GLUCOPHAGE) 1000 MG tablet TAKE 1 TABLET BY MOUTH 2 TIMES DAILY WITH MEAL. Patient taking differently: Take 1,000 mg by mouth 2 (two) times daily with a meal. 03/16/18  Yes [name], MD  metoprolol succinate (TOPROL-XL) 25 MG 24 hr tablet Take 1 tablet by mouth daily. 04/22/21  Yes [provider]  morphine (MS CONTIN) 30 MG 12 hr tablet SMARTSIG:1 Tablet(s) By Mouth Every 12 Hours 02/05/21  Yes [provider]  pantoprazole (PROTONIX) 20 MG tablet Take 20 mg by mouth daily.   Yes [provider]  Accu-Chek FastClix Lancets MISC Apply topically daily. 03/09/21   [provider]  alendronate (FOSAMAX) 70 MG tablet Take by mouth. 04/02/20   [provider]  ARMOUR THYROID 120 MG tablet Take 120 mg by mouth every morning. 02/17/21   [provider]  atorvastatin (LIPITOR) 20 MG tablet Take 1 tablet (20 mg total) by mouth daily at 6 PM. 10/24/17   [name], MD  Blood Glucose Monitoring Suppl (ACCU-CHEK GUIDE) w/Device KIT See admin instructions. 05/22/19   [provider]  BREZTRI AEROSPHERE 160-9-4.8 MCG/ACT AERO SMARTSIG:2 Inhalation Via Inhaler Twice Daily 05/11/21   [provider]  clobetasol ointment (TEMOVATE) 0.05 % Apply topically 2 (two) times daily. 01/12/21   [provider]  clopidogrel (PLAVIX) 75 MG tablet TAKE 1 TABLET BY MOUTH EVERY DAY 05/30/22   Kris Hartmann, NP  diazepam (VALIUM) 5 MG tablet Take 5 mg by mouth 2 (two) times daily.  04/25/14   [provider]  Docusate Sodium 100 MG capsule Take 1 tablet by mouth in the morning, at noon, and at bedtime.    [provider]  Ensure Max Protein (ENSURE MAX PROTEIN) LIQD Take 330 mLs (11 oz total) by mouth 2 (two) times daily. 05/21/19   Wieting, Richard, MD  ESBRIET 267 MG TABS Take 3 tablets by mouth 3 (three) times daily  as needed. 01/23/20   [provider]  famotidine (PEPCID) 20 MG tablet Take by mouth. 05/29/19 06/11/20  [provider]  glucose blood test strip See admin instructions. 06/18/21   [provider]  hydrocortisone 2.5 % ointment Apply topically 2 (two) times daily. 12/07/20   [provider]  linaclotide Rolan Lipa) 290 MCG CAPS capsule Take 1 capsule (290 mcg total) by mouth daily before breakfast. 06/13/22   Lucilla Lame, MD  metoprolol succinate (TOPROL-XL) 25 MG 24 hr tablet Take by mouth. 05/01/20 05/01/21  [provider]  mirtazapine (REMERON) 15 MG tablet Take by mouth. 01/14/20 04/08/21  [provider]  mirtazapine (REMERON) 30 MG tablet Take 30 mg by mouth at bedtime. 04/19/21   [provider]  naloxone [facility]) nasal spray 4 mg/0.1 mL Place into the nose.    [provider]  Pirfenidone 267 MG TABS Take by mouth. 06/30/21   [provider]  polyethylene glycol (MIRALAX / GLYCOLAX) 17 g packet Take 17 g by mouth daily. 05/21/19   Loletha Grayer, MD  polyethylene glycol-electrolytes (NULYTELY) 420 g  solution Prepare according to package instructions. Starting at 5:00 PM: Drink one 8 oz glass of mixture every 15 minutes until you finish half of the jug. Five hours prior to procedure, drink 8 oz glass of mixture every 15 minutes until it is all gone. Make sure you do not drink anything 4 hours prior to your procedure. 04/01/21   Jonathon Bellows, MD  predniSONE (DELTASONE) 10 MG tablet Take 20 mg by mouth daily with breakfast.    [provider]  predniSONE (DELTASONE) 10 MG tablet Take 1 tablet by mouth daily. 05/03/21   [provider]  rOPINIRole (REQUIP) 0.5 MG tablet Take by mouth. 09/23/19 02/24/21  [provider]  sitaGLIPtin (JANUVIA) 100 MG tablet Take by mouth. 05/27/19 02/24/21  [provider]  tapentadol HCl (NUCYNTA) 75 MG tablet Take 75 mg by mouth every 4 (four) hours as needed for  moderate pain or severe pain.    [provider]  thiamine 100 MG tablet Take 1 tablet (100 mg total) by mouth daily. 05/21/19   Loletha Grayer, MD  venlafaxine XR (EFFEXOR-XR) 150 MG 24 hr capsule Take 1 capsule (150 mg total) by mouth daily with breakfast. 01/24/18   [name], MD  Vitamin D, Ergocalciferol, (DRISDOL) 1.25 MG (50000 UNIT) CAPS capsule Take by mouth. 07/09/20   [provider]    Allergies as of 05/13/2022 - Review Complete 05/12/2022  Allergen Reaction Noted   Advair diskus [fluticasone-salmeterol] Shortness Of Breath 04/04/2012   Erythromycin Nausea And Vomiting 03/16/2007   Iodinated contrast media Shortness Of Breath and Other (See Comments) 04/08/2014   Ativan [lorazepam] Other (See Comments) 07/12/2016   Darvocet [propoxyphene n-acetaminophen] Nausea And Vomiting 04/16/2012   Doxycycline hyclate Nausea And Vomiting, Swelling, and Other (See Comments) 04/04/2012   Propoxyphene Nausea And Vomiting 04/16/2012   Septra [sulfamethoxazole-trimethoprim] Rash 04/16/2012    Family History  Problem Relation Age of Onset   Heart disease Father        cad, chf   Cancer Father    Heart disease Brother 1       CABG   Cancer Brother 36       pancreatic cancer   Diabetes Brother    Diabetes Brother    Heart disease Brother 96       CABG   Diabetes Brother    Heart disease Brother 48       CABG   Cancer Brother 75       prostate cancer   Heart disease Mother        cad   Diabetes Mother    Stroke Mother     Social History   Socioeconomic History   Marital status: Married    Spouse name: Not on file   Number of children: 2   Years of education: Not on file   Highest education level: Not on file  Occupational History    Comment: works full time 40 hrs  Tobacco Use   Smoking status: Former    Packs/day: 1.00    Years: 30.00    Total pack years: 30.00    Types: Cigarettes    Quit date: 08/22/2020    Years since quitting: 1.8    Smokeless tobacco: Never  Vaping Use   Vaping Use: Never used  Substance and Sexual Activity   Alcohol use: No   Drug use: No   Sexual activity: Never  Other Topics Concern   Not on file  Social History Narrative  Marital status:  Married x 35 years.       Children:  Children ages 20, 81.   2 grandchildren (71, 3).      Lives: with wife.     Employment: disability since 2013.       Tobacco:  1/2 ppd x 40 years.      Alcohol: rarely.      Exercise: walking the dogs daily.   Caffeine use: Carbonated beverages, Pepsi: Coffee, many servings a day Pepsi 3-4  Coffee 2 cups. Smoke detectors in home. Always wears seatbelts. Exercise: Inactive. No guns in the home.   Social Determinants of Health   Financial Resource Strain: Not on file  Food Insecurity: Not on file  Transportation Needs: Not on file  Physical Activity: Not on file  Stress: Not on file  Social Connections: Not on file  Intimate Partner Violence: Not on file    Review of Systems: See HPI, otherwise negative ROS  Physical Exam: BP (!) 145/84   Pulse 67   Temp (!) 97.1 F (36.2 C) (Temporal)   Resp 16   Ht _0  (1.753 m)   Wt 68 kg   SpO2 100%   BMI 22.15 kg/m  General:   Alert,  pleasant and cooperative in NAD Head:  Normocephalic and atraumatic. Neck:  Supple; no masses or thyromegaly. Lungs:  Clear throughout to auscultation, normal respiratory effort.    Heart:  +S1, +S2, Regular rate and rhythm, No edema. Abdomen:  Soft, nontender and nondistended. Normal bowel sounds, without guarding, and without rebound.   Neurologic:  Alert and  oriented x4;  grossly normal neurologically.  Impression/Plan: Kirk Robinson is here for an colonoscopy to be performed for Screening colonoscopy average risk   Risks, benefits, limitations, and alternatives regarding  colonoscopy have been reviewed with the patient.  Questions have been answered.  All parties agreeable.   Jonathon Bellows, MD  06/22/2022, 10:32 AM

## 2022-06-23 ENCOUNTER — Encounter: Payer: Self-pay | Admitting: Gastroenterology

## 2022-06-23 LAB — SURGICAL PATHOLOGY

## 2022-11-16 ENCOUNTER — Other Ambulatory Visit (INDEPENDENT_AMBULATORY_CARE_PROVIDER_SITE_OTHER): Payer: Self-pay | Admitting: Vascular Surgery

## 2022-11-16 DIAGNOSIS — Z9889 Other specified postprocedural states: Secondary | ICD-10-CM

## 2022-11-25 ENCOUNTER — Other Ambulatory Visit (INDEPENDENT_AMBULATORY_CARE_PROVIDER_SITE_OTHER): Payer: Self-pay | Admitting: Vascular Surgery

## 2022-11-29 ENCOUNTER — Encounter (INDEPENDENT_AMBULATORY_CARE_PROVIDER_SITE_OTHER): Payer: Self-pay | Admitting: Nurse Practitioner

## 2022-11-29 ENCOUNTER — Ambulatory Visit (INDEPENDENT_AMBULATORY_CARE_PROVIDER_SITE_OTHER): Payer: Medicare HMO

## 2022-11-29 ENCOUNTER — Ambulatory Visit (INDEPENDENT_AMBULATORY_CARE_PROVIDER_SITE_OTHER): Payer: Medicare HMO | Admitting: Nurse Practitioner

## 2022-11-29 VITALS — BP 112/77 | HR 84 | Resp 16 | Wt 177.2 lb

## 2022-11-29 DIAGNOSIS — I739 Peripheral vascular disease, unspecified: Secondary | ICD-10-CM

## 2022-11-29 DIAGNOSIS — Z9889 Other specified postprocedural states: Secondary | ICD-10-CM

## 2022-11-29 DIAGNOSIS — E7800 Pure hypercholesterolemia, unspecified: Secondary | ICD-10-CM | POA: Diagnosis not present

## 2022-11-29 DIAGNOSIS — E119 Type 2 diabetes mellitus without complications: Secondary | ICD-10-CM

## 2022-11-29 NOTE — Progress Notes (Signed)
Subjective:    Patient ID: Kirk Robinson, male    DOB: September 12, 1955, 67 y.o.   MRN: [id number] Chief Complaint  Patient presents with   Follow-up    Ultrasound follow up    Kirk Robinson is a 67 year old male that returns to the office for review of his peripheral arterial disease.  He had intervention in 2021 with stent placement to left common iliac artery.  There have been no interval changes in lower extremity symptoms. No interval shortening of the patient's claudication distance or development of rest pain symptoms. No new ulcers or wounds have occurred since the last visit.  He does endorse having some weakness with ambulation but he is currently on ambulatory oxygen and attributes this may be a contributing factor.  There have been no significant changes to the patient's overall health care.  The patient denies amaurosis fugax or recent TIA symptoms. There are no documented recent neurological changes noted. There is no history of DVT, PE or superficial thrombophlebitis. The patient denies recent episodes of angina or shortness of breath.   ABI Rt=1.13 and Lt=1.17  (previous ABI's Rt=1.17 and Lt=1.31) Duplex ultrasound of the bilateral tibial arteries reveals triphasic waveforms and normal toe waveforms bilaterally    Review of Systems  Neurological:  Positive for weakness.  All other systems reviewed and are negative.      Objective:   Physical Exam Vitals reviewed.  HENT:     Head: Normocephalic.  Neck:     Vascular: No carotid bruit.  Cardiovascular:     Rate and Rhythm: Normal rate.     Pulses:          Radial pulses are 2+ on the left side.       Dorsalis pedis pulses are 2+ on the right side and 2+ on the left side.       Posterior tibial pulses are 2+ on the right side and 2+ on the left side.  Pulmonary:     Effort: Pulmonary effort is normal.     Comments: Home O2 Skin:    General: Skin is warm and dry.  Neurological:     Mental Status: He is  alert and oriented to person, place, and time.  Psychiatric:        Mood and Affect: Mood normal.        Behavior: Behavior normal.        Thought Content: Thought content normal.        Judgment: Judgment normal.     BP 112/77 (BP Location: Right Arm)   Pulse 84   Resp 16   Wt 177 lb 3.2 oz (80.4 kg)   BMI 26.17 kg/m   Past Medical History:  Diagnosis Date   Alcohol use 03/16/2007   patient risk factors   Allergic rhinitis, cause unspecified    Altered mental status    Anxiety state, unspecified    Cervicalgia 09/17/2007   COPD (chronic obstructive pulmonary disease)    Diabetes mellitus without complication    Diplopia 12/21/2007   Esophageal reflux    Gastritis 05/13/2009   HCAP (healthcare-associated pneumonia) 08/12/2014   Hematuria 03/16/2007   Hyperthyroidism    graves disease   Iodine hypothyroidism    Memory loss    NSIP (nonspecific interstitial pneumonia)    on azathioprine   Osteoporosis, unspecified    bone density per Morivati in 2012   Other abnormal glucose 04/08/2008   Other diseases of lung, not elsewhere classified 01/23/2009   s/p  Fleming/pulmonology consult; intolerant  to Advair   Peripheral vascular disease, unspecified 12/24/2009   Personal history of colonic polyps    Pneumonia, organism unspecified(486)    Pure hypercholesterolemia    Shortness of breath    Stroke 2005   Tobacco use disorder 03/16/2007   patient risk factors   Unspecified hypothyroidism 01/07/2010   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    Social History   Socioeconomic History   Marital status: Married    Spouse name: Not on file   Number of children: 2   Years of education: Not on file   Highest education level: Not on file  Occupational History    Comment: works full time 40 hrs  Tobacco Use   Smoking status: Former    Packs/day: 1.00    Years: 30.00    Additional pack years: 0.00    Total pack years: 30.00    Types: Cigarettes     Quit date: 08/22/2020    Years since quitting: 2.2   Smokeless tobacco: Never  Vaping Use   Vaping Use: Never used  Substance and Sexual Activity   Alcohol use: No   Drug use: No   Sexual activity: Never  Other Topics Concern   Not on file  Social History Narrative   Marital status:  Married x 35 years.       Children:  Children ages 6530, 6124.   2 grandchildren (7, 3).      Lives: with wife.     Employment: disability since 2013.       Tobacco:  1/2 ppd x 40 years.      Alcohol: rarely.      Exercise: walking the dogs daily.   Caffeine use: Carbonated beverages, Pepsi: Coffee, many servings a day Pepsi 3-4  Coffee 2 cups. Smoke detectors in home. Always wears seatbelts. Exercise: Inactive. No guns in the home.   Social Determinants of Health   Financial Resource Strain: Not on file  Food Insecurity: Not on file  Transportation Needs: Not on file  Physical Activity: Not on file  Stress: Not on file  Social Connections: Not on file  Intimate Partner Violence: Not on file    Past Surgical History:  Procedure Laterality Date   Admission 11/2011     altered mental status/confusion with syncope.  CT head negative, MRI brain negative, EEG: diffuse slowing but no  seizure acitivity.  Labs negative.  [facility].   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   COLONOSCOPY WITH PROPOFOL N/A 03/10/2020   Procedure: COLONOSCOPY WITH PROPOFOL;  Surgeon: Wyline MoodAnna, Kiran, MD;  [facility];  Service: Gastroenterology;  Laterality: N/A;   COLONOSCOPY WITH PROPOFOL N/A 03/16/2021   Procedure: COLONOSCOPY WITH PROPOFOL;  Surgeon: Wyline MoodAnna, Kiran, MD;  [facility];  Service: Gastroenterology;  Laterality: N/A;   COLONOSCOPY WITH PROPOFOL N/A 06/22/2022   Procedure: COLONOSCOPY WITH PROPOFOL;  Surgeon: Wyline MoodAnna, Kiran, MD;  [facility];  Service: Endoscopy;  Laterality: N/A;   ESOPHAGOGASTRODUODENOSCOPY (EGD) WITH PROPOFOL N/A 03/16/2021   Procedure:  ESOPHAGOGASTRODUODENOSCOPY (EGD) WITH PROPOFOL;  Surgeon: Wyline MoodAnna, Kiran, MD;  [facility];  Service: Gastroenterology;  Laterality: N/A;   EYE SURGERY  2010   GIVENS CAPSULE STUDY N/A 04/16/2021   Procedure: GIVENS CAPSULE STUDY;  Surgeon: Wyline MoodAnna, Kiran, MD;  [facility];  Service: Gastroenterology;  Laterality: N/A;   LAPAROSCOPIC REVISION VENTRICULAR-PERITONEAL (V-P) SHUNT Right 05/20/2014   Procedure: LAPAROSCOPIC REVISION VENTRICULAR-PERITONEAL (V-P) SHUNT;  Surgeon: Laurell JosephsBurke  Janee Morn, MD;  Location: [facility] NEURO ORS;  Service: General;  Laterality: Right;   LOWER EXTREMITY ANGIOGRAPHY Left 06/11/2020   Procedure: LOWER EXTREMITY ANGIOGRAPHY;  Surgeon: Annice Needy, MD;  Location: [facility] INVASIVE CV LAB;  Service: Cardiovascular;  Laterality: Left;   Neuropsychiatric evaluation  04/2012   poor short-term memory, poor recall, delayed reaction time; permanently disabled.      Family History  Problem Relation Age of Onset   Heart disease Father        cad, chf   Cancer Father    Heart disease Brother 31       CABG   Cancer Brother 83       pancreatic cancer   Diabetes Brother    Diabetes Brother    Heart disease Brother 79       CABG   Diabetes Brother    Heart disease Brother 35       CABG   Cancer Brother 15       prostate cancer   Heart disease Mother        cad   Diabetes Mother    Stroke Mother     Allergies  Allergen Reactions   Advair Diskus [Fluticasone-Salmeterol] Shortness Of Breath   Erythromycin Nausea And Vomiting   Iodinated Contrast Media Shortness Of Breath and Other (See Comments)   Ativan [Lorazepam] Other (See Comments)    Caused pt's heart to stop   Darvocet [Propoxyphene N-Acetaminophen] Nausea And Vomiting   Doxycycline Hyclate Nausea And Vomiting, Swelling and Other (See Comments)    Tongue swelling, severe depression   Propoxyphene Nausea And Vomiting   Septra [Sulfamethoxazole-Trimethoprim] Rash       Latest Ref Rng & Units  05/19/2019    5:07 AM 05/16/2019    4:07 AM 05/15/2019    6:10 AM  CBC  WBC 4.0 - 10.5 K/uL 7.3  9.5  10.7   Hemoglobin 13.0 - 17.0 g/dL 21.1  17.3  56.7   Hematocrit 39.0 - 52.0 % 32.7  40.8  43.2   Platelets 150 - 400 K/uL 357  294  265       CMP     Component Value Date/Time   NA 142 05/21/2019 0518   NA 142 01/24/2018 1100   NA 134 (L) 05/11/2014 1512   K 4.2 05/21/2019 0518   K 4.5 05/11/2014 1512   CL 104 05/21/2019 0518   CL 95 (L) 05/11/2014 1512   CO2 26 05/21/2019 0518   CO2 32 05/11/2014 1512   GLUCOSE 87 05/21/2019 0518   GLUCOSE 295 (H) 05/11/2014 1512   BUN 15 06/11/2020 0923   BUN 17 01/24/2018 1100   BUN 21 (H) 05/11/2014 1512   CREATININE 0.98 06/11/2020 0923   CREATININE 0.73 07/12/2016 1651   CALCIUM 9.2 05/21/2019 0518   CALCIUM 8.8 05/11/2014 1512   PROT 6.8 05/11/2019 1159   PROT 6.8 01/24/2018 1100   PROT 7.3 05/05/2014 2257   ALBUMIN 3.7 05/11/2019 1159   ALBUMIN 4.1 01/24/2018 1100   ALBUMIN 2.7 (L) 05/05/2014 2257   AST 21 05/11/2019 1159   AST 24 05/05/2014 2257   ALT 30 05/11/2019 1159   ALT 14 05/05/2014 2257   ALKPHOS 152 (H) 05/11/2019 1159   ALKPHOS 87 05/05/2014 2257   BILITOT 0.7 05/11/2019 1159   BILITOT 0.3 01/24/2018 1100   BILITOT 0.3 05/05/2014 2257   GFRNONAA >60 06/11/2020 0923   GFRNONAA >60 05/11/2014 1512   GFRAA >60 05/21/2019 0518  GFRAA >60 05/11/2014 1512     No results found.     Assessment & Plan:   1. Peripheral arterial disease with history of revascularization  Recommend:  The patient has evidence of atherosclerosis of the lower extremities with claudication.  The patient does not voice lifestyle limiting changes at this point in time.  Noninvasive studies do not suggest clinically significant change.  No invasive studies, angiography or surgery at this time The patient should continue walking and begin a more formal exercise program.  The patient should continue antiplatelet therapy and  aggressive treatment of the lipid abnormalities  No changes in the patient's medications at this time  Continued surveillance is indicated as atherosclerosis is likely to progress with time.    The patient will continue follow up with noninvasive studies as ordered.     2. Type 2 diabetes mellitus without complication, without long-term current use of insulin Continue hypoglycemic medications as already ordered, these medications have been reviewed and there are no changes at this time.  Hgb A1C to be monitored as already arranged by primary service  3. Pure hypercholesterolemia Continue statin as ordered and reviewed, no changes at this time   Current Outpatient Medications on File Prior to Visit  Medication Sig Dispense Refill   Accu-Chek FastClix Lancets MISC Apply topically daily.     albuterol (VENTOLIN HFA) 108 (90 Base) MCG/ACT inhaler Inhale 2 puffs into the lungs every 6 (six) hours as needed for wheezing or shortness of breath. 18 g 0   ARIPiprazole (ABILIFY) 2 MG tablet Take 2 mg by mouth daily.     ARMOUR THYROID 120 MG tablet Take 120 mg by mouth every morning.     ASPIRIN LOW DOSE 81 MG tablet TAKE 1 TABLET BY MOUTH EVERY DAY 90 tablet 4   atorvastatin (LIPITOR) 20 MG tablet Take 1 tablet (20 mg total) by mouth daily at 6 PM. 90 tablet 3   Blood Glucose Monitoring Suppl (ACCU-CHEK GUIDE) w/Device KIT See admin instructions.     BREZTRI AEROSPHERE 160-9-4.8 MCG/ACT AERO SMARTSIG:2 Inhalation Via Inhaler Twice Daily     clobetasol ointment (TEMOVATE) 0.05 % Apply topically 2 (two) times daily.     diazepam (VALIUM) 5 MG tablet Take 5 mg by mouth 2 (two) times daily.      diazepam (VALIUM) 5 MG tablet Take by mouth.     diltiazem (CARDIZEM CD) 120 MG 24 hr capsule Take by mouth daily.     Docusate Sodium 100 MG capsule Take 1 tablet by mouth in the morning, at noon, and at bedtime.     Ensure Max Protein (ENSURE MAX PROTEIN) LIQD Take 330 mLs (11 oz total) by mouth 2  (two) times daily. 330 mL 0   ESBRIET 267 MG TABS Take 3 tablets by mouth 3 (three) times daily as needed.     glucose blood test strip See admin instructions.     linaclotide (LINZESS) 290 MCG CAPS capsule Take 1 capsule (290 mcg total) by mouth daily before breakfast. 30 capsule 6   metFORMIN (GLUCOPHAGE) 1000 MG tablet TAKE 1 TABLET BY MOUTH 2 TIMES DAILY WITH MEAL. (Patient taking differently: Take 1,000 mg by mouth 2 (two) times daily with a meal.) 180 tablet 0   metoprolol succinate (TOPROL-XL) 25 MG 24 hr tablet Take 1 tablet by mouth daily.     mirtazapine (REMERON) 30 MG tablet Take 30 mg by mouth at bedtime.     morphine (MS CONTIN) 30 MG 12 hr  tablet SMARTSIG:1 Tablet(s) By Mouth Every 12 Hours     naloxone (NARCAN) nasal spray 4 mg/0.1 mL Place into the nose.     pantoprazole (PROTONIX) 20 MG tablet Take 20 mg by mouth daily.     Pirfenidone 267 MG TABS Take by mouth.     polyethylene glycol (MIRALAX / GLYCOLAX) 17 g packet Take 17 g by mouth daily. 30 each 0   polyethylene glycol-electrolytes (NULYTELY) 420 g solution Prepare according to package instructions. Starting at 5:00 PM: Drink one 8 oz glass of mixture every 15 minutes until you finish half of the jug. Five hours prior to procedure, drink 8 oz glass of mixture every 15 minutes until it is all gone. Make sure you do not drink anything 4 hours prior to your procedure. 8000 mL 0   predniSONE (DELTASONE) 10 MG tablet Take 20 mg by mouth daily with breakfast.     predniSONE (DELTASONE) 10 MG tablet Take 1 tablet by mouth daily.     tapentadol HCl (NUCYNTA) 75 MG tablet Take 75 mg by mouth every 4 (four) hours as needed for moderate pain or severe pain.     venlafaxine XR (EFFEXOR-XR) 150 MG 24 hr capsule Take 1 capsule (150 mg total) by mouth daily with breakfast. 90 capsule 1   Vitamin D, Ergocalciferol, (DRISDOL) 1.25 MG (50000 UNIT) CAPS capsule Take by mouth.     alendronate (FOSAMAX) 70 MG tablet Take by mouth. (Patient not  taking: Reported on 11/29/2022)     clopidogrel (PLAVIX) 75 MG tablet TAKE 1 TABLET BY MOUTH EVERY DAY (Patient not taking: Reported on 11/29/2022) 90 tablet 3   famotidine (PEPCID) 20 MG tablet Take by mouth.     hydrocortisone 2.5 % ointment Apply topically 2 (two) times daily. (Patient not taking: Reported on 11/29/2022)     linagliptin (TRADJENTA) 5 MG TABS tablet Take 1 tablet (5 mg total) by mouth daily. (Patient not taking: Reported on 11/29/2022) 30 tablet 0   metoprolol succinate (TOPROL-XL) 25 MG 24 hr tablet Take by mouth.     mirtazapine (REMERON) 15 MG tablet Take by mouth.     rOPINIRole (REQUIP) 0.5 MG tablet Take by mouth.     sitaGLIPtin (JANUVIA) 100 MG tablet Take by mouth.     thiamine 100 MG tablet Take 1 tablet (100 mg total) by mouth daily. (Patient not taking: Reported on 11/29/2022) 30 tablet 0   No current facility-administered medications on file prior to visit.    There are no Patient Instructions on file for this visit. No follow-ups on file.   Georgiana Spinner, NP

## 2022-12-01 LAB — VAS US ABI WITH/WO TBI
Left ABI: 1.17
Right ABI: 1.13

## 2023-01-19 ENCOUNTER — Other Ambulatory Visit: Payer: Self-pay | Admitting: Gastroenterology

## 2023-05-26 ENCOUNTER — Other Ambulatory Visit (INDEPENDENT_AMBULATORY_CARE_PROVIDER_SITE_OTHER): Payer: Self-pay | Admitting: Nurse Practitioner

## 2023-11-24 ENCOUNTER — Other Ambulatory Visit (INDEPENDENT_AMBULATORY_CARE_PROVIDER_SITE_OTHER): Payer: Self-pay | Admitting: Nurse Practitioner

## 2023-11-24 DIAGNOSIS — I739 Peripheral vascular disease, unspecified: Secondary | ICD-10-CM

## 2023-11-28 ENCOUNTER — Ambulatory Visit (INDEPENDENT_AMBULATORY_CARE_PROVIDER_SITE_OTHER): Payer: Medicare HMO | Admitting: Vascular Surgery

## 2023-11-28 ENCOUNTER — Encounter (INDEPENDENT_AMBULATORY_CARE_PROVIDER_SITE_OTHER): Payer: Medicare HMO

## 2023-12-11 ENCOUNTER — Emergency Department

## 2023-12-11 ENCOUNTER — Inpatient Hospital Stay
Admission: EM | Admit: 2023-12-11 | Discharge: 2023-12-14 | DRG: 308 | Disposition: A | Discharge status: Home Health | Attending: Student | Admitting: Student

## 2023-12-11 ENCOUNTER — Other Ambulatory Visit: Payer: Self-pay

## 2023-12-11 DIAGNOSIS — G919 Hydrocephalus, unspecified: Secondary | ICD-10-CM | POA: Diagnosis present

## 2023-12-11 DIAGNOSIS — R339 Retention of urine, unspecified: Secondary | ICD-10-CM | POA: Diagnosis present

## 2023-12-11 DIAGNOSIS — Z9981 Dependence on supplemental oxygen: Secondary | ICD-10-CM

## 2023-12-11 DIAGNOSIS — Z860100 Personal history of colon polyps, unspecified: Secondary | ICD-10-CM

## 2023-12-11 DIAGNOSIS — M810 Age-related osteoporosis without current pathological fracture: Secondary | ICD-10-CM | POA: Diagnosis present

## 2023-12-11 DIAGNOSIS — N179 Acute kidney failure, unspecified: Secondary | ICD-10-CM | POA: Diagnosis present

## 2023-12-11 DIAGNOSIS — E1151 Type 2 diabetes mellitus with diabetic peripheral angiopathy without gangrene: Secondary | ICD-10-CM | POA: Diagnosis present

## 2023-12-11 DIAGNOSIS — J8410 Pulmonary fibrosis, unspecified: Secondary | ICD-10-CM | POA: Diagnosis present

## 2023-12-11 DIAGNOSIS — E039 Hypothyroidism, unspecified: Secondary | ICD-10-CM | POA: Diagnosis present

## 2023-12-11 DIAGNOSIS — R338 Other retention of urine: Secondary | ICD-10-CM | POA: Diagnosis present

## 2023-12-11 DIAGNOSIS — E876 Hypokalemia: Secondary | ICD-10-CM | POA: Diagnosis present

## 2023-12-11 DIAGNOSIS — E7800 Pure hypercholesterolemia, unspecified: Secondary | ICD-10-CM | POA: Diagnosis present

## 2023-12-11 DIAGNOSIS — Z79899 Other long term (current) drug therapy: Secondary | ICD-10-CM

## 2023-12-11 DIAGNOSIS — G894 Chronic pain syndrome: Secondary | ICD-10-CM | POA: Diagnosis present

## 2023-12-11 DIAGNOSIS — E860 Dehydration: Secondary | ICD-10-CM | POA: Diagnosis present

## 2023-12-11 DIAGNOSIS — E872 Acidosis, unspecified: Secondary | ICD-10-CM | POA: Diagnosis present

## 2023-12-11 DIAGNOSIS — Z800 Family history of malignant neoplasm of digestive organs: Secondary | ICD-10-CM

## 2023-12-11 DIAGNOSIS — Z881 Allergy status to other antibiotic agents status: Secondary | ICD-10-CM

## 2023-12-11 DIAGNOSIS — R7989 Other specified abnormal findings of blood chemistry: Secondary | ICD-10-CM | POA: Diagnosis present

## 2023-12-11 DIAGNOSIS — I471 Supraventricular tachycardia, unspecified: Principal | ICD-10-CM | POA: Diagnosis present

## 2023-12-11 DIAGNOSIS — N1831 Chronic kidney disease, stage 3a: Secondary | ICD-10-CM | POA: Diagnosis present

## 2023-12-11 DIAGNOSIS — J189 Pneumonia, unspecified organism: Secondary | ICD-10-CM | POA: Diagnosis present

## 2023-12-11 DIAGNOSIS — D72829 Elevated white blood cell count, unspecified: Secondary | ICD-10-CM | POA: Diagnosis present

## 2023-12-11 DIAGNOSIS — Z7984 Long term (current) use of oral hypoglycemic drugs: Secondary | ICD-10-CM

## 2023-12-11 DIAGNOSIS — B348 Other viral infections of unspecified site: Secondary | ICD-10-CM | POA: Diagnosis present

## 2023-12-11 DIAGNOSIS — R0902 Hypoxemia: Secondary | ICD-10-CM | POA: Diagnosis present

## 2023-12-11 DIAGNOSIS — W19XXXA Unspecified fall, initial encounter: Secondary | ICD-10-CM

## 2023-12-11 DIAGNOSIS — J441 Chronic obstructive pulmonary disease with (acute) exacerbation: Secondary | ICD-10-CM | POA: Diagnosis present

## 2023-12-11 DIAGNOSIS — R55 Syncope and collapse: Secondary | ICD-10-CM | POA: Diagnosis present

## 2023-12-11 DIAGNOSIS — Z982 Presence of cerebrospinal fluid drainage device: Secondary | ICD-10-CM

## 2023-12-11 DIAGNOSIS — I482 Chronic atrial fibrillation, unspecified: Secondary | ICD-10-CM | POA: Diagnosis present

## 2023-12-11 DIAGNOSIS — Z888 Allergy status to other drugs, medicaments and biological substances status: Secondary | ICD-10-CM

## 2023-12-11 DIAGNOSIS — T380X5A Adverse effect of glucocorticoids and synthetic analogues, initial encounter: Secondary | ICD-10-CM | POA: Diagnosis present

## 2023-12-11 DIAGNOSIS — F419 Anxiety disorder, unspecified: Secondary | ICD-10-CM | POA: Diagnosis present

## 2023-12-11 DIAGNOSIS — E1122 Type 2 diabetes mellitus with diabetic chronic kidney disease: Secondary | ICD-10-CM | POA: Diagnosis present

## 2023-12-11 DIAGNOSIS — N170 Acute kidney failure with tubular necrosis: Secondary | ICD-10-CM | POA: Diagnosis present

## 2023-12-11 DIAGNOSIS — Z7902 Long term (current) use of antithrombotics/antiplatelets: Secondary | ICD-10-CM

## 2023-12-11 DIAGNOSIS — B9789 Other viral agents as the cause of diseases classified elsewhere: Secondary | ICD-10-CM | POA: Diagnosis present

## 2023-12-11 DIAGNOSIS — F32A Depression, unspecified: Secondary | ICD-10-CM | POA: Diagnosis present

## 2023-12-11 DIAGNOSIS — E1129 Type 2 diabetes mellitus with other diabetic kidney complication: Secondary | ICD-10-CM | POA: Diagnosis present

## 2023-12-11 DIAGNOSIS — Z87891 Personal history of nicotine dependence: Secondary | ICD-10-CM

## 2023-12-11 DIAGNOSIS — Z833 Family history of diabetes mellitus: Secondary | ICD-10-CM

## 2023-12-11 DIAGNOSIS — Z7983 Long term (current) use of bisphosphonates: Secondary | ICD-10-CM

## 2023-12-11 DIAGNOSIS — Y92009 Unspecified place in unspecified non-institutional (private) residence as the place of occurrence of the external cause: Secondary | ICD-10-CM

## 2023-12-11 DIAGNOSIS — I639 Cerebral infarction, unspecified: Secondary | ICD-10-CM | POA: Diagnosis present

## 2023-12-11 DIAGNOSIS — Z8042 Family history of malignant neoplasm of prostate: Secondary | ICD-10-CM

## 2023-12-11 DIAGNOSIS — Z8249 Family history of ischemic heart disease and other diseases of the circulatory system: Secondary | ICD-10-CM

## 2023-12-11 DIAGNOSIS — Z8673 Personal history of transient ischemic attack (TIA), and cerebral infarction without residual deficits: Secondary | ICD-10-CM

## 2023-12-11 DIAGNOSIS — Z823 Family history of stroke: Secondary | ICD-10-CM

## 2023-12-11 LAB — BASIC METABOLIC PANEL WITH GFR
Anion gap: 23 — ABNORMAL HIGH (ref 5–15)
BUN: 42 mg/dL — ABNORMAL HIGH (ref 8–23)
CO2: 26 mmol/L (ref 22–32)
Calcium: 8.7 mg/dL — ABNORMAL LOW (ref 8.9–10.3)
Chloride: 87 mmol/L — ABNORMAL LOW (ref 98–111)
Creatinine, Ser: 3.23 mg/dL — ABNORMAL HIGH (ref 0.61–1.24)
GFR, Estimated: 20 mL/min — ABNORMAL LOW (ref >60)
Glucose, Bld: 155 mg/dL — ABNORMAL HIGH (ref 70–99)
Potassium: 3.3 mmol/L — ABNORMAL LOW (ref 3.5–5.1)
Sodium: 136 mmol/L (ref 135–145)

## 2023-12-11 LAB — URINALYSIS, W/ REFLEX TO CULTURE (INFECTION SUSPECTED)
Bilirubin Urine: NEGATIVE
Glucose, UA: NEGATIVE mg/dL
Hgb urine dipstick: NEGATIVE
Ketones, ur: NEGATIVE mg/dL
Leukocytes,Ua: NEGATIVE
Nitrite: NEGATIVE
Protein, ur: NEGATIVE mg/dL
Specific Gravity, Urine: 1.008 (ref 1.005–1.030)
Squamous Epithelial / HPF: 0 /HPF (ref 0–5)
pH: 5 (ref 5.0–8.0)

## 2023-12-11 LAB — LACTIC ACID, PLASMA
Lactic Acid, Venous: 4.8 mmol/L (ref 0.5–1.9)
Lactic Acid, Venous: 4.8 mmol/L (ref 0.5–1.9)

## 2023-12-11 LAB — PROTIME-INR
INR: 1.1 (ref 0.8–1.2)
Prothrombin Time: 14.8 s (ref 11.4–15.2)

## 2023-12-11 LAB — MAGNESIUM: Magnesium: 1.1 mg/dL — ABNORMAL LOW (ref 1.7–2.4)

## 2023-12-11 LAB — CBC
HCT: 41.7 % (ref 39.0–52.0)
Hemoglobin: 14 g/dL (ref 13.0–17.0)
MCH: 31.7 pg (ref 26.0–34.0)
MCHC: 33.6 g/dL (ref 30.0–36.0)
MCV: 94.3 fL (ref 80.0–100.0)
Platelets: 381 10*3/uL (ref 150–400)
RBC: 4.42 MIL/uL (ref 4.22–5.81)
RDW: 14.4 % (ref 11.5–15.5)
WBC: 13.2 10*3/uL — ABNORMAL HIGH (ref 4.0–10.5)
nRBC: 0 % (ref 0.0–0.2)

## 2023-12-11 LAB — APTT: aPTT: 29 s (ref 24–36)

## 2023-12-11 LAB — PHOSPHORUS: Phosphorus: 5.9 mg/dL — ABNORMAL HIGH (ref 2.5–4.6)

## 2023-12-11 LAB — CBG MONITORING, ED: Glucose-Capillary: 104 mg/dL — ABNORMAL HIGH (ref 70–99)

## 2023-12-11 LAB — TROPONIN I (HIGH SENSITIVITY)
Troponin I (High Sensitivity): 14 ng/L (ref <18)
Troponin I (High Sensitivity): 16 ng/L (ref <18)

## 2023-12-11 LAB — TSH: TSH: 8.014 u[IU]/mL — ABNORMAL HIGH (ref 0.350–4.500)

## 2023-12-11 MED ORDER — HEPARIN SODIUM (PORCINE) 5000 UNIT/ML IJ SOLN
5000.0000 [IU] | Freq: Three times a day (TID) | INTRAMUSCULAR | Status: DC
  Administered 2023-12-11 – 2023-12-14 (×8): 5000 [IU] via SUBCUTANEOUS
  Filled 2023-12-11 (×8): qty 1

## 2023-12-11 MED ORDER — PANTOPRAZOLE SODIUM 40 MG PO TBEC
40.0000 mg | DELAYED_RELEASE_TABLET | Freq: Two times a day (BID) | ORAL | Status: DC
  Administered 2023-12-12 – 2023-12-14 (×4): 40 mg via ORAL
  Filled 2023-12-11 (×5): qty 1

## 2023-12-11 MED ORDER — METOPROLOL TARTRATE 5 MG/5ML IV SOLN
5.0000 mg | Freq: Once | INTRAVENOUS | Status: AC
  Administered 2023-12-11: 5 mg via INTRAVENOUS

## 2023-12-11 MED ORDER — SODIUM CHLORIDE 0.9 % IV SOLN: INTRAVENOUS | Status: AC

## 2023-12-11 MED ORDER — LEVALBUTEROL HCL 0.63 MG/3ML IN NEBU: 0.6300 mg | INHALATION_SOLUTION | Freq: Four times a day (QID) | RESPIRATORY_TRACT | Status: DC | PRN

## 2023-12-11 MED ORDER — ATORVASTATIN CALCIUM 20 MG PO TABS
20.0000 mg | ORAL_TABLET | Freq: Every day | ORAL | Status: DC
  Administered 2023-12-11 – 2023-12-13 (×3): 20 mg via ORAL
  Filled 2023-12-11 (×3): qty 1

## 2023-12-11 MED ORDER — ROPINIROLE HCL 1 MG PO TABS
0.5000 mg | ORAL_TABLET | Freq: Every day | ORAL | Status: DC
  Administered 2023-12-11 – 2023-12-13 (×3): 0.5 mg via ORAL
  Filled 2023-12-11 (×2): qty 1
  Filled 2023-12-11 (×2): qty 2

## 2023-12-11 MED ORDER — METHYLPREDNISOLONE SODIUM SUCC 125 MG IJ SOLR
125.0000 mg | Freq: Once | INTRAMUSCULAR | Status: AC
Start: 2023-12-11 — End: 2023-12-11
  Administered 2023-12-11: 125 mg via INTRAVENOUS
  Filled 2023-12-11: qty 2

## 2023-12-11 MED ORDER — VANCOMYCIN HCL 1750 MG/350ML IV SOLN
1750.0000 mg | Freq: Once | INTRAVENOUS | Status: AC
  Administered 2023-12-11: 1750 mg via INTRAVENOUS
  Filled 2023-12-11: qty 350

## 2023-12-11 MED ORDER — MORPHINE SULFATE ER 15 MG PO TBCR
30.0000 mg | EXTENDED_RELEASE_TABLET | Freq: Two times a day (BID) | ORAL | Status: DC
  Administered 2023-12-11 – 2023-12-14 (×6): 30 mg via ORAL
  Filled 2023-12-11 (×6): qty 2

## 2023-12-11 MED ORDER — INSULIN ASPART 100 UNIT/ML IJ SOLN
0.0000 [IU] | Freq: Three times a day (TID) | INTRAMUSCULAR | Status: DC
  Administered 2023-12-12: 3 [IU] via SUBCUTANEOUS
  Filled 2023-12-11 (×2): qty 1

## 2023-12-11 MED ORDER — METOPROLOL TARTRATE 5 MG/5ML IV SOLN
INTRAVENOUS | Status: AC
Start: 2023-12-11 — End: 2023-12-12
  Filled 2023-12-11: qty 5

## 2023-12-11 MED ORDER — METOPROLOL SUCCINATE ER 25 MG PO TB24
25.0000 mg | ORAL_TABLET | Freq: Every day | ORAL | Status: DC
  Administered 2023-12-12 – 2023-12-14 (×3): 25 mg via ORAL
  Filled 2023-12-11 (×3): qty 1

## 2023-12-11 MED ORDER — METHYLPREDNISOLONE SODIUM SUCC 125 MG IJ SOLR
80.0000 mg | Freq: Every day | INTRAMUSCULAR | Status: DC
  Administered 2023-12-12 – 2023-12-13 (×2): 80 mg via INTRAVENOUS
  Filled 2023-12-11 (×2): qty 2

## 2023-12-11 MED ORDER — DM-GUAIFENESIN ER 30-600 MG PO TB12: 1.0000 | ORAL_TABLET | Freq: Two times a day (BID) | ORAL | Status: DC | PRN

## 2023-12-11 MED ORDER — DIAZEPAM 5 MG PO TABS
5.0000 mg | ORAL_TABLET | Freq: Two times a day (BID) | ORAL | Status: DC
  Administered 2023-12-11 – 2023-12-14 (×6): 5 mg via ORAL
  Filled 2023-12-11 (×5): qty 1
  Filled 2023-12-11: qty 3

## 2023-12-11 MED ORDER — ADENOSINE 6 MG/2ML IV SOLN
6.0000 mg | Freq: Once | INTRAVENOUS | Status: AC
  Administered 2023-12-11: 6 mg via INTRAVENOUS

## 2023-12-11 MED ORDER — METOPROLOL SUCCINATE ER 25 MG PO TB24: 12.5000 mg | ORAL_TABLET | Freq: Every day | ORAL | Status: DC

## 2023-12-11 MED ORDER — FENTANYL CITRATE PF 50 MCG/ML IJ SOSY: 50.0000 ug | PREFILLED_SYRINGE | Freq: Once | INTRAMUSCULAR | Status: DC

## 2023-12-11 MED ORDER — METOPROLOL TARTRATE 5 MG/5ML IV SOLN: 5.0000 mg | INTRAVENOUS | Status: DC | PRN

## 2023-12-11 MED ORDER — VENLAFAXINE HCL ER 75 MG PO CP24
150.0000 mg | ORAL_CAPSULE | Freq: Every evening | ORAL | Status: DC
  Administered 2023-12-11 – 2023-12-13 (×3): 150 mg via ORAL
  Filled 2023-12-11: qty 2
  Filled 2023-12-11 (×2): qty 1
  Filled 2023-12-11: qty 2

## 2023-12-11 MED ORDER — METOPROLOL TARTRATE 25 MG PO TABS
12.5000 mg | ORAL_TABLET | Freq: Once | ORAL | Status: AC
  Administered 2023-12-11: 12.5 mg via ORAL
  Filled 2023-12-11: qty 1

## 2023-12-11 MED ORDER — DOCUSATE SODIUM 100 MG PO CAPS
100.0000 mg | ORAL_CAPSULE | Freq: Three times a day (TID) | ORAL | Status: DC
  Administered 2023-12-11 – 2023-12-14 (×6): 100 mg via ORAL
  Filled 2023-12-11 (×8): qty 1

## 2023-12-11 MED ORDER — FENTANYL CITRATE PF 50 MCG/ML IJ SOSY
PREFILLED_SYRINGE | INTRAMUSCULAR | Status: AC
  Filled 2023-12-11: qty 1

## 2023-12-11 MED ORDER — POTASSIUM CHLORIDE CRYS ER 20 MEQ PO TBCR
40.0000 meq | EXTENDED_RELEASE_TABLET | Freq: Once | ORAL | Status: AC
  Administered 2023-12-11: 40 meq via ORAL
  Filled 2023-12-11: qty 2

## 2023-12-11 MED ORDER — ADENOSINE 12 MG/4ML IV SOLN
INTRAVENOUS | Status: AC
Start: 2023-12-11 — End: 2023-12-12
  Filled 2023-12-11: qty 4

## 2023-12-11 MED ORDER — MIRTAZAPINE 15 MG PO TABS
45.0000 mg | ORAL_TABLET | Freq: Every day | ORAL | Status: DC
  Administered 2023-12-11 – 2023-12-13 (×3): 45 mg via ORAL
  Filled 2023-12-11 (×3): qty 3

## 2023-12-11 MED ORDER — CLOPIDOGREL BISULFATE 75 MG PO TABS
75.0000 mg | ORAL_TABLET | Freq: Every day | ORAL | Status: DC
  Administered 2023-12-12 – 2023-12-14 (×3): 75 mg via ORAL
  Filled 2023-12-11 (×3): qty 1

## 2023-12-11 MED ORDER — VANCOMYCIN VARIABLE DOSE PER UNSTABLE RENAL FUNCTION (PHARMACIST DOSING): Status: DC

## 2023-12-11 MED ORDER — BUDESON-GLYCOPYRROL-FORMOTEROL 160-9-4.8 MCG/ACT IN AERO
2.0000 | INHALATION_SPRAY | Freq: Two times a day (BID) | RESPIRATORY_TRACT | Status: DC
  Administered 2023-12-11 – 2023-12-13 (×3): 2 via RESPIRATORY_TRACT
  Filled 2023-12-11: qty 5.9

## 2023-12-11 MED ORDER — LACTATED RINGERS IV BOLUS
1500.0000 mL | Freq: Once | INTRAVENOUS | Status: AC
  Administered 2023-12-11: 1500 mL via INTRAVENOUS

## 2023-12-11 MED ORDER — ASPIRIN 81 MG PO TBEC
81.0000 mg | DELAYED_RELEASE_TABLET | Freq: Every day | ORAL | Status: DC
  Administered 2023-12-12 – 2023-12-14 (×3): 81 mg via ORAL
  Filled 2023-12-11 (×3): qty 1

## 2023-12-11 MED ORDER — ENOXAPARIN SODIUM 40 MG/0.4ML IJ SOSY: 40.0000 mg | PREFILLED_SYRINGE | INTRAMUSCULAR | Status: DC

## 2023-12-11 MED ORDER — ADENOSINE 6 MG/2ML IV SOLN
INTRAVENOUS | Status: AC
Start: 2023-12-11 — End: 2023-12-12
  Filled 2023-12-11: qty 2

## 2023-12-11 MED ORDER — TAMSULOSIN HCL 0.4 MG PO CAPS
0.4000 mg | ORAL_CAPSULE | Freq: Every day | ORAL | Status: DC
  Administered 2023-12-12 – 2023-12-14 (×3): 0.4 mg via ORAL
  Filled 2023-12-11 (×3): qty 1

## 2023-12-11 MED ORDER — INSULIN ASPART 100 UNIT/ML IJ SOLN
0.0000 [IU] | Freq: Every day | INTRAMUSCULAR | Status: DC
  Administered 2023-12-13: 2 [IU] via SUBCUTANEOUS
  Filled 2023-12-11: qty 1

## 2023-12-11 MED ORDER — LEVOFLOXACIN 500 MG PO TABS: 500.0000 mg | ORAL_TABLET | Freq: Every day | ORAL | Status: DC

## 2023-12-11 MED ORDER — ONDANSETRON HCL 4 MG/2ML IJ SOLN: 4.0000 mg | Freq: Three times a day (TID) | INTRAMUSCULAR | Status: DC | PRN

## 2023-12-11 MED ORDER — LEVOTHYROXINE SODIUM 50 MCG PO TABS: 100.0000 ug | ORAL_TABLET | Freq: Every day | ORAL | Status: DC

## 2023-12-11 MED ORDER — PIPERACILLIN-TAZOBACTAM 3.375 G IVPB
3.3750 g | Freq: Once | INTRAVENOUS | Status: AC
  Administered 2023-12-11: 3.375 g via INTRAVENOUS
  Filled 2023-12-11: qty 50

## 2023-12-11 MED ORDER — ARIPIPRAZOLE 5 MG PO TABS
5.0000 mg | ORAL_TABLET | Freq: Every evening | ORAL | Status: DC
  Administered 2023-12-11 – 2023-12-13 (×3): 5 mg via ORAL
  Filled 2023-12-11 (×5): qty 1

## 2023-12-11 MED ORDER — ACETAMINOPHEN 325 MG PO TABS: 650.0000 mg | ORAL_TABLET | Freq: Four times a day (QID) | ORAL | Status: DC | PRN

## 2023-12-11 MED ORDER — HYDRALAZINE HCL 20 MG/ML IJ SOLN: 5.0000 mg | INTRAMUSCULAR | Status: DC | PRN

## 2023-12-11 MED ORDER — SODIUM CHLORIDE 0.9 % IV BOLUS (SEPSIS)
1000.0000 mL | Freq: Once | INTRAVENOUS | Status: AC
  Administered 2023-12-11: 1000 mL via INTRAVENOUS

## 2023-12-11 MED ORDER — MAGNESIUM SULFATE 4 GM/100ML IV SOLN
4.0000 g | Freq: Once | INTRAVENOUS | Status: AC
Start: 2023-12-11 — End: 2023-12-12
  Administered 2023-12-12: 4 g via INTRAVENOUS
  Filled 2023-12-11: qty 100

## 2023-12-11 MED ORDER — IPRATROPIUM BROMIDE 0.02 % IN SOLN
0.5000 mg | RESPIRATORY_TRACT | Status: DC
  Administered 2023-12-11 – 2023-12-12 (×5): 0.5 mg via RESPIRATORY_TRACT
  Filled 2023-12-11 (×8): qty 2.5

## 2023-12-11 MED ORDER — DILTIAZEM HCL ER COATED BEADS 120 MG PO CP24: 120.0000 mg | ORAL_CAPSULE | Freq: Every day | ORAL | Status: DC

## 2023-12-11 NOTE — H&P (Addendum)
 History and Physical    [name]:[id number] DOB: 10-Feb-1956 DOA: 12/11/2023  Referring MD/NP/PA:   PCP: [name], MD   Patient coming from:  The patient is coming from home.     Chief Complaint: syncope, fall, SOB, tachycardia  HPI: Kirk Robinson is a 68 y.o. 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  Pt states that he has worsening shortness of breath for more than a week. He had positive test for rhinovirus on 02/06/2024. Pt is taking methylprednisolone  and Levaquin  for possible pneumonia, without improvement. Patient was seen by [name] of pulmonology today, found to have tachycardia with heart rate 189, and hypotension with blood pressure 81/60. Pt is sent to ED for further evaluation and treatment. Pt was found to have SVT with HR up to 173 in ED. her blood pressure is 94/75 in ED.  Pt was given total of 3 dose of 6 mg of adenosine , and 5 mg of IV metoprolol , converted to sinus rhythm.  Currently heart rate is in the 80s.   Per EDP, his wife reported that pt Has had couple falls at home which were witnessed, and 2 episodes of syncope today which lasted for few seconds.  Notably patient had tachycardia and oxygen desaturation on his normal 4L oxygen at home.  Currently patient has dry cough, no chest pain.  Patient states that he had fever few days ago, which has resolved.  Currently no fever or chills.  He has nausea, no vomiting, diarrhea or abdominal pain.  No symptoms of UTI.  Patient was found to have acute urinary retention, and Foley catheter is placed in the ED.  Patient denies drug use or drinking alcohol,  Data reviewed independently and ED Course: pt was found to have WBC 13.2, potassium 3.3, magnesium  1.1, phosphorus 5.9, negative  UA, troponin 14 --> 16, lactic acid 4.8, worsening renal function, temperature normal, blood pressure 94/75, 108/71, RR 21 --> 18, oxygen saturation 99% on home level of 4L oxygen currently.  Patient is placed in PCU for observation.  Chest x-ray:  Diffuse interstitial lung changes, likely chronic. Enlarged cardiac silhouette.   Presumed VP shunt catheter tubing running vertically along the left hemithorax.    CT of head and CT of C-spine: 1. No acute traumatic intracranial abnormality. 2. Stable Slit-like ventricles with ventriculoperitoneal shunt in grossly appropriate position. 3. No acute displaced fracture or traumatic listhesis of the cervical spine. 4. Emphysema (ICD10-J43.9). Otherwise please see separately dictated CT chest 12/11/2023.    CT of chest/abdomen/pelvis: 1. No acute intrathoracic, intra-abdominal, intrapelvic traumatic injury with limited evaluation on this noncontrast study. 2. No acute fracture or traumatic malalignment of the thoracic or lumbar spine. 3. Emphysema (ICD10-J43.9) and interstitial lung disease. 4.  Aortic Atherosclerosis (ICD10-I70.0   EKG: I have personally reviewed. SVT, heart rate of 157, LAD, poor R wave progression   Review of Systems:   General: no fevers, chills, no body weight gain, has poor appetite, has fatigue HEENT: no blurry vision, hearing changes or sore throat Respiratory: has dyspnea, coughing, wheezing CV: no chest pain, no palpitations GI: has nausea, no vomiting, abdominal pain, diarrhea, constipation GU: no dysuria, burning on urination, increased urinary frequency, hematuria  Ext: no leg edema Neuro: no unilateral weakness, numbness, or tingling, no vision change or hearing loss.  Has fall and syncope Skin: no rash, no skin tear. MSK: No muscle spasm, no deformity, no limitation of range of movement in spin Heme: No easy bruising.  Travel history: No recent long distant travel.   Allergy:  Allergies  Allergen  Reactions   Advair Diskus [Fluticasone-Salmeterol] Shortness Of Breath   Erythromycin Nausea And Vomiting   Iodinated Contrast Media Shortness Of Breath and Other (See Comments)   Ativan  [Lorazepam ] Other (See Comments)    Caused pt's heart to stop   Cephalexin Other (See Comments)    Heart Burn   Darvocet [Propoxyphene N-Acetaminophen ] Nausea And Vomiting   Doxycycline Hyclate Nausea And Vomiting, Swelling and Other (See Comments)    Tongue swelling, severe depression   Propoxyphene Nausea And Vomiting   Septra [Sulfamethoxazole-Trimethoprim] Rash    Past Medical History:  Diagnosis Date   Alcohol use 03/16/2007   patient risk factors   Allergic rhinitis, cause unspecified    Altered mental status    Anxiety state, unspecified    Cervicalgia 09/17/2007   COPD (chronic obstructive pulmonary disease) (HCC)    Diabetes mellitus without complication (HCC)    Diplopia 12/21/2007   Esophageal reflux    Gastritis 05/13/2009   HCAP (healthcare-associated pneumonia) 08/12/2014   Hematuria 03/16/2007   Hyperthyroidism    graves disease   Iodine  hypothyroidism    Memory loss    NSIP (nonspecific interstitial pneumonia) (HCC)    on azathioprine    Osteoporosis, unspecified    bone density per Morivati in 2012   Other abnormal glucose 04/08/2008   Other diseases of lung, not elsewhere classified 01/23/2009   s/p Fleming/pulmonology consult; intolerant  to Advair   Peripheral vascular disease, unspecified (HCC) 12/24/2009   Personal history of colonic polyps    Pneumonia, organism unspecified(486)    Pure hypercholesterolemia    Shortness of breath    Stroke (HCC) 2005   Tobacco use disorder 03/16/2007   patient risk factors   Unspecified hypothyroidism 01/07/2010   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  CT head negative, MRI  brain negative, EEG: diffuse slowing but no  seizure acitivity.  Labs negative.  [facility].   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   COLONOSCOPY WITH PROPOFOL  N/A 03/10/2020   Procedure: COLONOSCOPY WITH PROPOFOL ;  Surgeon: Luke Salaam, MD;  Location[facility] ENDOSCOPY;  Service: Gastroenterology;  Laterality: N/A;   COLONOSCOPY WITH PROPOFOL  N/A 03/16/2021   Procedure: COLONOSCOPY WITH PROPOFOL ;  Surgeon: Luke Salaam, MD;  Location: [facility] ENDOSCOPY;  Service: Gastroenterology;  Laterality: N/A;   COLONOSCOPY WITH PROPOFOL  N/A 06/22/2022   Procedure: COLONOSCOPY WITH PROPOFOL ;  Surgeon: Luke Salaam, MD;  Location: [facility] ENDOSCOPY;  Service: Endoscopy;  Laterality: N/A;   ESOPHAGOGASTRODUODENOSCOPY (EGD) WITH PROPOFOL  N/A 03/16/2021   Procedure: ESOPHAGOGASTRODUODENOSCOPY (EGD) WITH PROPOFOL ;  Surgeon: Luke Salaam, MD;  Location: [facility] ENDOSCOPY;  Service: Gastroenterology;  Laterality: N/A;   EYE SURGERY  2010   GIVENS CAPSULE STUDY N/A 04/16/2021   Procedure: GIVENS CAPSULE STUDY;  Surgeon: Luke Salaam, MD;  Location: [facility] ENDOSCOPY;  Service: Gastroenterology;  Laterality: N/A;   LAPAROSCOPIC REVISION VENTRICULAR-PERITONEAL (V-P) SHUNT Right 05/20/2014   Procedure: LAPAROSCOPIC REVISION VENTRICULAR-PERITONEAL (V-P) SHUNT;  Surgeon: Dorena Gander, MD;  Location: [facility] NEURO ORS;  Service: General;  Laterality: Right;   LOWER EXTREMITY ANGIOGRAPHY Left 06/11/2020   Procedure: LOWER EXTREMITY ANGIOGRAPHY;  Surgeon:  Celso College, MD;  Location: [facility] INVASIVE CV LAB;  Service: Cardiovascular;  Laterality: Left;   Neuropsychiatric evaluation  04/2012   poor short-term memory, poor recall, delayed reaction time; permanently disabled.      Social History:  reports that he quit smoking about 3 years ago. His smoking use included cigarettes. He started smoking about 33 years ago. He has a 30 pack-year smoking history. He has never used smokeless tobacco. He reports that he does not drink alcohol and does not  use drugs.  Family History:  Family History  Problem Relation Age of Onset   Heart disease Father        cad, chf   Cancer Father    Heart disease Brother 99       CABG   Cancer Brother 34       pancreatic cancer   Diabetes Brother    Diabetes Brother    Heart disease Brother 34       CABG   Diabetes Brother    Heart disease Brother 28       CABG   Cancer Brother 79       prostate cancer   Heart disease Mother        cad   Diabetes Mother    Stroke Mother      Prior to Admission medications   Medication Sig Start Date End Date Taking? Authorizing Provider  Accu-Chek FastClix Lancets MISC Apply topically daily. 03/09/21   [provider]  albuterol  (VENTOLIN  HFA) 108 (90 Base) MCG/ACT inhaler Inhale 2 puffs into the lungs every 6 (six) hours as needed for wheezing or shortness of breath. 05/21/19   Verla Glaze, MD  alendronate (FOSAMAX) 70 MG tablet Take by mouth. Patient not taking: Reported on 11/29/2022 04/02/20   [provider]  ARIPiprazole  (ABILIFY ) 2 MG tablet Take 2 mg by mouth daily. 05/17/21   [provider]  ARMOUR THYROID  120 MG tablet Take 120 mg by mouth every morning. 02/17/21   [provider]  ASPIRIN  LOW DOSE 81 MG tablet TAKE 1 TABLET BY MOUTH EVERY DAY 11/25/22   Dew, Jason S, MD  atorvastatin  (LIPITOR) 20 MG tablet Take 1 tablet (20 mg total) by mouth daily at 6 PM. 10/24/17   Smith, Kristi M, MD  Blood Glucose Monitoring Suppl (ACCU-CHEK GUIDE) w/Device KIT See admin instructions. 05/22/19   [provider]  BREZTRI  AEROSPHERE 160-9-4.8 MCG/ACT AERO SMARTSIG:2 Inhalation Via Inhaler Twice Daily 05/11/21   [provider]  clobetasol ointment (TEMOVATE) 0.05 % Apply topically 2 (two) times daily. 01/12/21   [provider]  clopidogrel  (PLAVIX ) 75 MG tablet TAKE 1 TABLET BY MOUTH EVERY DAY 05/26/23   Brown, Fallon E, NP  diazepam  (VALIUM ) 5 MG tablet Take 5 mg by mouth 2 (two) times daily.  04/25/14    [provider]  diazepam  (VALIUM ) 5 MG tablet Take by mouth. 05/26/21   [provider]  diltiazem  (CARDIZEM  CD) 120 MG 24 hr capsule Take by mouth daily. 05/06/20   [provider]  Docusate Sodium  100 MG capsule Take 1 tablet by mouth in the morning, at noon, and at bedtime.    [provider]  Ensure Max Protein (ENSURE MAX PROTEIN) LIQD Take 330 mLs (11 oz total) by mouth 2 (two) times daily. 05/21/19   Wieting, Richard, MD  ESBRIET 267 MG TABS Take 3 tablets by mouth 3 (three) times daily as needed. 01/23/20   [provider]  famotidine  (  PEPCID ) 20 MG tablet Take by mouth. 05/29/19 06/11/20  [provider]  glucose blood test strip See admin instructions. 06/18/21   [provider]  hydrocortisone  2.5 % ointment Apply topically 2 (two) times daily. Patient not taking: Reported on 11/29/2022 12/07/20   [provider]  linaclotide  (LINZESS ) 290 MCG CAPS capsule Take 1 capsule (290 mcg total) by mouth daily before breakfast. SCHEDULE VISIT FOR October 2024 01/19/23   Marnee Sink, MD  linagliptin  (TRADJENTA ) 5 MG TABS tablet Take 1 tablet (5 mg total) by mouth daily. Patient not taking: Reported on 11/29/2022 05/21/19   Verla Glaze, MD  metFORMIN  (GLUCOPHAGE ) 1000 MG tablet TAKE 1 TABLET BY MOUTH 2 TIMES DAILY WITH MEAL. Patient taking differently: Take 1,000 mg by mouth 2 (two) times daily with a meal. 03/16/18   [name], MD  metoprolol  succinate (TOPROL -XL) 25 MG 24 hr tablet Take by mouth. 05/01/20 05/01/21  [provider]  metoprolol  succinate (TOPROL -XL) 25 MG 24 hr tablet Take 1 tablet by mouth daily. 04/22/21   [provider]  mirtazapine  (REMERON ) 15 MG tablet Take by mouth. 01/14/20 04/08/21  [provider]  mirtazapine  (REMERON ) 30 MG tablet Take 30 mg by mouth at bedtime. 04/19/21   [provider]  morphine  (MS CONTIN ) 30 MG 12 hr tablet SMARTSIG:1 Tablet(s) By Mouth Every 12  Hours 02/05/21   [provider]  naloxone  (NARCAN ) nasal spray 4 mg/0.1 mL Place into the nose.    [provider]  pantoprazole  (PROTONIX ) 20 MG tablet Take 20 mg by mouth daily.    [provider]  Pirfenidone 267 MG TABS Take by mouth. 06/30/21   [provider]  polyethylene glycol (MIRALAX  / GLYCOLAX ) 17 g packet Take 17 g by mouth daily. 05/21/19   Verla Glaze, MD  polyethylene glycol-electrolytes (NULYTELY ) 420 g solution Prepare according to package instructions. Starting at 5:00 PM: Drink one 8 oz glass of mixture every 15 minutes until you finish half of the jug. Five hours prior to procedure, drink 8 oz glass of mixture every 15 minutes until it is all gone. Make sure you do not drink anything 4 hours prior to your procedure. 04/01/21   Luke Salaam, MD  predniSONE  (DELTASONE ) 10 MG tablet Take 20 mg by mouth daily with breakfast.    [provider]  predniSONE  (DELTASONE ) 10 MG tablet Take 1 tablet by mouth daily. 05/03/21   [provider]  rOPINIRole  (REQUIP ) 0.5 MG tablet Take by mouth. 09/23/19 02/24/21  [provider]  sitaGLIPtin (JANUVIA) 100 MG tablet Take by mouth. 05/27/19 02/24/21  [provider]  tapentadol  HCl (NUCYNTA ) 75 MG tablet Take 75 mg by mouth every 4 (four) hours as needed for moderate pain or severe pain.    [provider]  thiamine  100 MG tablet Take 1 tablet (100 mg total) by mouth daily. Patient not taking: Reported on 11/29/2022 05/21/19   Verla Glaze, MD  venlafaxine  XR (EFFEXOR -XR) 150 MG 24 hr capsule Take 1 capsule (150 mg total) by mouth daily with breakfast. 01/24/18   [name], MD  Vitamin D , Ergocalciferol , (DRISDOL ) 1.25 MG (50000 UNIT) CAPS capsule Take by mouth. 07/09/20   [provider]    Physical Exam: Vitals:   12/11/23 1800 12/11/23 1830 12/11/23 1832 12/11/23 1952  BP: 108/71 104/75    Pulse: 86 89    Resp: 15 13    Temp:    98.1 F (36.7 C)   TempSrc:  Oral  SpO2: 99% 96%    Weight:   77.1 kg   Height:   5\' 9"  (1.753 m)    General: Not in acute distress HEENT:       Eyes: PERRL, EOMI, no jaundice       ENT: No discharge from the ears and nose, no pharynx injection, no tonsillar enlargement.        Neck: No JVD, no bruit, no mass felt. Heme: No neck lymph node enlargement. Cardiac: S1/S2, RRR, No murmurs, No gallops or rubs. Respiratory: Has decreased air movement bilaterally, has mild wheezing bilaterally GI: Soft, nondistended, nontender, no rebound pain, no organomegaly, BS present. GU: No hematuria Ext: No pitting leg edema bilaterally. 1+DP/PT pulse bilaterally. Musculoskeletal: No joint deformities, No joint redness or warmth, no limitation of ROM in spin. Skin: No rashes.  Neuro: Alert, oriented X3, cranial nerves II-XII grossly intact, moves all extremities normally. Psych: Patient is not psychotic, no suicidal or hemocidal ideation.  Labs on Admission: I have personally reviewed following labs and imaging studies  CBC: Recent Labs  Lab 12/11/23 1507  WBC 13.2*  HGB 14.0  HCT 41.7  MCV 94.3  PLT 381   Basic Metabolic Panel: Recent Labs  Lab 12/11/23 1507 12/11/23 1755  NA 136  --   K 3.3*  --   CL 87*  --   CO2 26  --   GLUCOSE 155*  --   BUN 42*  --   CREATININE 3.23*  --   CALCIUM  8.7*  --   MG  --  1.1*  PHOS  --  5.9*   GFR: Estimated Creatinine Clearance: 22.8 mL/min (A) (by C-G formula based on SCr of 3.23 mg/dL (H)). Liver Function Tests: No results for input(s): "AST", "ALT", "ALKPHOS", "BILITOT", "PROT", "ALBUMIN" in the last 168 hours. No results for input(s): "LIPASE", "AMYLASE" in the last 168 hours. No results for input(s): "AMMONIA" in the last 168 hours. Coagulation Profile: Recent Labs  Lab 12/11/23 1543  INR 1.1   Cardiac Enzymes: No results for input(s): "CKTOTAL", "CKMB", "CKMBINDEX", "TROPONINI" in the last 168 hours. BNP (last 3 results) No results for  input(s): "PROBNP" in the last 8760 hours. HbA1C: No results for input(s): "HGBA1C" in the last 72 hours. CBG: No results for input(s): "GLUCAP" in the last 168 hours. Lipid Profile: No results for input(s): "CHOL", "HDL", "LDLCALC", "TRIG", "CHOLHDL", "LDLDIRECT" in the last 72 hours. Thyroid  Function Tests: No results for input(s): "TSH", "T4TOTAL", "FREET4", "T3FREE", "THYROIDAB" in the last 72 hours. Anemia Panel: No results for input(s): "VITAMINB12", "FOLATE", "FERRITIN", "TIBC", "IRON", "RETICCTPCT" in the last 72 hours. Urine analysis:    Component Value Date/Time   COLORURINE STRAW (A) 12/11/2023 1755   APPEARANCEUR CLEAR (A) 12/11/2023 1755   APPEARANCEUR Clear 03/02/2021 1456   LABSPEC 1.008 12/11/2023 1755   LABSPEC 1.006 11/20/2011 2054   PHURINE 5.0 12/11/2023 1755   GLUCOSEU NEGATIVE 12/11/2023 1755   GLUCOSEU Negative 11/20/2011 2054   HGBUR NEGATIVE 12/11/2023 1755   BILIRUBINUR NEGATIVE 12/11/2023 1755   BILIRUBINUR Negative 03/02/2021 1456   BILIRUBINUR Negative 11/20/2011 2054   KETONESUR NEGATIVE 12/11/2023 1755   PROTEINUR NEGATIVE 12/11/2023 1755   UROBILINOGEN 1.0 03/01/2017 1249   UROBILINOGEN 1.0 03/13/2015 2215   NITRITE NEGATIVE 12/11/2023 1755   LEUKOCYTESUR NEGATIVE 12/11/2023 1755   LEUKOCYTESUR Negative 11/20/2011 2054   Sepsis Labs: @LABRCNTIP (procalcitonin:4,lacticidven:4) )No results found for this or any previous visit (from the past 240 hours).   Radiological Exams on Admission:  Assessment/Plan Principal Problem:   SVT (supraventricular tachycardia) (HCC) Active Problems:   Atrial fibrillation, chronic (HCC)   Syncope   Fall at home, initial encounter   COPD exacerbation (HCC)   Pulmonary fibrosis (HCC)   Rhinovirus infection   Leukocytosis   Acute renal failure superimposed on stage 3a chronic kidney disease (HCC)   Adult hypothyroidism   Type II diabetes mellitus with renal manifestations (HCC)   Stroke (HCC)    Hypokalemia   Acute urinary retention   Elevated lactic acid level   Chronic pain syndrome   Anxiety and depression   Assessment and Plan:  SVT (supraventricular tachycardia) (HCC) and hx of Afrial fibrillation, chronic (HCC): Patient is converted to sinus rhythm after treated with adenosine  and metoprolol , heart rate 80s currently.  -Placed in PCU for observation - As needed metoprolol  5 mg every 2 hour for heart rate> 125 - Increase home oral metoprolol  dose from 12.5 mg daily to 25 mg daily - Check TSH level - IV fluid: 1.5 L LR, and 1 L normal saline was given in ED, then 75 cc/h of NS - 4 g magnesium  sulfate was given due to hypomagnesemia with magnesium  1.1  Syncope and fall: Likely due to SVT.  No focal neurodeficit on physical examination.  CT head negative for acute intracranial abnormalities. - Fall precaution - Frequent neurocheck  Fall at home, initial encounter -see above -pt   COPD exacerbation and pulmonary fibrosis (HCC): Patient has mild wheezing on auscultation, likely triggered by rhinovirus infection.  No infiltration chest x-ray and CT scan.  Patient has WBC 13.2, blood no fever.  His leukocytosis is likely due to steroid use.  Patient is currently taking Levaquin .  Patient received 1 dose of vancomycin  and Zosyn  in ED.  Will discontinue antibiotics - Solu-Medrol  125 mg, then 80 mg daily - Bronchodilators - As needed Mucinex  - Follow-up of blood culture which is ordered by EDP  Rhinovirus infection -See above  Leukocytosis -See above  Acute renal failure superimposed on stage 3a chronic kidney disease (HCC): Baseline creatinine 1.4 on 07/13/2023.  Her creatinine is 3.23, BUN 42, GFR 20 today.  Likely due to dehydration and poor ATN secondary to hypotension - Avoid using renal toxic medications - IV fluid as above - hold torsemide  Adult hypothyroidism -Continue Synthroid  - Check  TSH  Type II diabetes mellitus with renal manifestations (HCC): Recent  A1c 5.8.  Patient is taking Tradjenta , metformin  and Januvia at home. -Sliding scale insulin   History of Stroke (HCC) -Plavix  and liopitor  HLD -Lipitor  Hypokalemia and hypomagnesemia: Potassium 3.3, magnesium  1.1.  Phosphorus 5.9 -Repleted potassium and magnesium   Acute urinary retention -Placed Foley catheter - Flomax  Remeron , Requip ,  Elevated lactic acid level: Lactic acid of 4.8, no fever, clinically does not seem to have sepsis.  Likely due to dehydration. -IV fluid as above - Trend lactic acid levels  Chronic pain syndrome -Continue home MS Contin  30 mg twice daily  Anxiety and depression - Continue home medications: Effexor , Requip , Valium , Abilify     DVT ppx: SQ Heparin      Code Status: Full code     Family Communication:     not done, no family member is at bed side.       Disposition Plan:  Anticipate discharge back to previous environment  Consults called:  none  Admission status and Level of care: Progressive:    for obs     Dispo: The patient is from: Home  Anticipated d/c is to: Home              Anticipated d/c date is: 1 day              Patient currently is not medically stable to d/c.    Severity of Illness:  The appropriate patient status for this patient is OBSERVATION. Observation status is judged to be reasonable and necessary in order to provide the required intensity of service to ensure the patient's safety. The patient's presenting symptoms, physical exam findings, and initial radiographic and laboratory data in the context of their medical condition is felt to place them at decreased risk for further clinical deterioration. Furthermore, it is anticipated that the patient will be medically stable for discharge from the hospital within 2 midnights of admission.        Date of Service 12/11/2023    Fidencio Hue [facility]   If 7PM-7AM, please contact night-coverage [email or web address] 12/11/2023, 7:55 PM

## 2023-12-11 NOTE — ED Provider Notes (Signed)
[facility] Provider Note    Event Date/Time   First MD Initiated Contact with Patient 12/11/23 1513     (approximate)   History   Tachycardia  Arrives from Pulmonary. Has been on multiple antibiotics, currently taking Levaquin . BP today was 81/60, HR 189.  Wears 4l/ [place] home oxygen.  Pt comes with c/o sob and tachycardia for about week. Pt has been passing out on wife. Pt wears 4L [place] at home. Pt on 4L currently and 02 94. Pt HR in 173 on dinamap.    HPI Kirk Robinson is a 68 y.o. male PMH multiple medical comorbidities including COPD, interstitial lung disease, atrial fibrillation not on anticoagulation, type 2 diabetes, multiple other comorbidities presents for evaluation of tachycardia, syncope - Per patient and wife, has been feeling somewhat unwell over the past 2-3 days.  Did have a couple falls at home which were witnessed, no clear syncope at that time however did have 2 syncopal episodes today.  Both witnessed, caught the first time, no head strike.  Second episode did have head strike.  Both with LOC as a few seconds.  Notably becomes tachycardic to greater than 150 as well as hypoxic despite his normal 4 L nasal cannula - Wife tells me patient tested positive for rhinovirus about 1 week ago, says he has episodes of tachycardia and hypoxia when he has had pneumonia in the past - No recent surgery, stasis or travel.  No history of DVT/PE.  No leg swelling.  No chest pain.  Per review of outside records, last seen in clinic on 12/07/2023.  Noted to have fever and some shortness of breath with lower than normal oxygen saturations at that time.  Labs with no leukocytosis, mild anemia (hemoglobin 12.4).  No other relevant labs available although reportedly also tested for viruses, wife says enterovirus/rhinovirus result positive.      Physical Exam   Triage Vital Signs: ED Triage Vitals [12/11/23 1505]  Encounter Vitals Group     BP 106/87     Systolic  BP Percentile      Diastolic BP Percentile      Pulse Rate (!) 173     Resp (!) 21     Temp 98 F (36.7 C)     Temp src      SpO2 93 %     Weight      Height      Head Circumference      Peak Flow      Pain Score 0     Pain Loc      Pain Education      Exclude from Growth Chart     Most recent vital signs: Vitals:   12/11/23 2300 12/11/23 2315  BP: 102/76 103/74  Pulse: 84 87  Resp: 10 12  Temp:    SpO2: 98% 98%     General: Awake, no distress.  Normal mentation. CV:  Good peripheral perfusion.  Tachycardic, regular rhythm, RP 2+. Resp:  Normal effort.  Somewhat coarse breath sounds with end expiratory wheezing throughout. Abd:  No distention. Nontender to deep palpation throughout    ED Results / Procedures / Treatments   Labs (all labs ordered are listed, but only abnormal results are displayed) Labs Reviewed  BASIC METABOLIC PANEL WITH GFR - Abnormal; Notable for the following components:      Result Value   Potassium 3.3 (*)    Chloride 87 (*)    Glucose, Bld 155 (*)  BUN 42 (*)    Creatinine, Ser 3.23 (*)    Calcium  8.7 (*)    GFR, Estimated 20 (*)    Anion gap 23 (*)    All other components within normal limits  CBC - Abnormal; Notable for the following components:   WBC 13.2 (*)    All other components within normal limits  LACTIC ACID, PLASMA - Abnormal; Notable for the following components:   Lactic Acid, Venous 4.8 (*)    All other components within normal limits  LACTIC ACID, PLASMA - Abnormal; Notable for the following components:   Lactic Acid, Venous 4.8 (*)    All other components within normal limits  URINALYSIS, W/ REFLEX TO CULTURE (INFECTION SUSPECTED) - Abnormal; Notable for the following components:   Color, Urine STRAW (*)    APPearance CLEAR (*)    Bacteria, UA RARE (*)    All other components within normal limits  MAGNESIUM  - Abnormal; Notable for the following components:   Magnesium  1.1 (*)    All other components within  normal limits  PHOSPHORUS - Abnormal; Notable for the following components:   Phosphorus 5.9 (*)    All other components within normal limits  TSH - Abnormal; Notable for the following components:   TSH 8.014 (*)    All other components within normal limits  CBG MONITORING, ED - Abnormal; Notable for the following components:   Glucose-Capillary 104 (*)    All other components within normal limits  CULTURE, BLOOD (ROUTINE X 2)  CULTURE, BLOOD (ROUTINE X 2)  EXPECTORATED SPUTUM ASSESSMENT W GRAM STAIN, RFLX TO RESP C  PROTIME-INR  APTT  HIV ANTIBODY (ROUTINE TESTING W REFLEX)  BASIC METABOLIC PANEL WITH GFR  CBC  MAGNESIUM   LACTIC ACID, PLASMA  LACTIC ACID, PLASMA  LACTIC ACID, PLASMA  TROPONIN I (HIGH SENSITIVITY)  TROPONIN I (HIGH SENSITIVITY)     EKG  ECG = SVT   RADIOLOGY Radiology interpreted by myself and radiology reports reviewed.  No obvious acute pathology.  PROCEDURES:  Critical Care performed: Yes, see critical care procedure note(s)  .Critical Care  Performed by: Collis Deaner, MD Authorized by: Collis Deaner, MD   Critical care provider statement:    Critical care time (minutes):  30   Critical care was necessary to treat or prevent imminent or life-threatening deterioration of the following conditions: Tachydysrhythmia and requiring chemical cardioversion.   Critical care was time spent personally by me on the following activities:  Development of treatment plan with patient or surrogate, discussions with consultants, evaluation of patient's response to treatment, examination of patient, ordering and review of laboratory studies, ordering and review of radiographic studies, ordering and performing treatments and interventions, pulse oximetry, re-evaluation of patient's condition and review of old charts   I assumed direction of critical care for this patient from another provider in my specialty: no     Care discussed with: admitting provider       MEDICATIONS ORDERED IN ED: Medications  fentaNYL  (SUBLIMAZE ) injection 50 mcg (50 mcg Intravenous Not Given 12/11/23 1533)  adenosine  (ADENOCARD ) 6 MG/2ML injection (  Not Given 12/11/23 1531)  adenosine  (ADENOCARD ) 12 MG/4ML injection (  Not Given 12/11/23 1532)  fentaNYL  (SUBLIMAZE ) 50 MCG/ML injection (  Not Given 12/11/23 1532)  metoprolol  tartrate (LOPRESSOR ) 5 MG/5ML injection (  Not Given 12/11/23 1641)  adenosine  (ADENOCARD ) 6 MG/2ML injection (  Not Given 12/11/23 1546)  vancomycin  (VANCOREADY) IVPB 1750 mg/350 mL (1,750 mg Intravenous New Bag/Given 12/11/23  2327)  metoprolol  tartrate (LOPRESSOR ) injection 5 mg (has no administration in time range)  ipratropium (ATROVENT ) nebulizer solution 0.5 mg (0.5 mg Nebulization Given 12/11/23 1953)  levalbuterol  (XOPENEX ) nebulizer solution 0.63 mg (has no administration in time range)  dextromethorphan-guaiFENesin  (MUCINEX  DM) 30-600 MG per 12 hr tablet 1 tablet (has no administration in time range)  ondansetron  (ZOFRAN ) injection 4 mg (has no administration in time range)  hydrALAZINE  (APRESOLINE ) injection 5 mg (has no administration in time range)  acetaminophen  (TYLENOL ) tablet 650 mg (has no administration in time range)  methylPREDNISolone  sodium succinate  (SOLU-MEDROL ) 125 mg/2 mL injection 80 mg (has no administration in time range)  insulin  aspart (novoLOG ) injection 0-9 Units (has no administration in time range)  insulin  aspart (novoLOG ) injection 0-5 Units ( Subcutaneous Not Given 12/11/23 2150)  heparin  injection 5,000 Units (5,000 Units Subcutaneous Given 12/11/23 2138)  aspirin  EC tablet 81 mg (has no administration in time range)  morphine  (MS CONTIN ) 12 hr tablet 30 mg (30 mg Oral Given 12/11/23 2135)  atorvastatin  (LIPITOR) tablet 20 mg (20 mg Oral Given 12/11/23 2135)  ARIPiprazole  (ABILIFY ) tablet 5 mg (5 mg Oral Given 12/11/23 2136)  diazepam  (VALIUM ) tablet 5 mg (5 mg Oral Given 12/11/23 2134)  mirtazapine  (REMERON ) tablet 45  mg (45 mg Oral Given 12/11/23 2134)  venlafaxine  XR (EFFEXOR -XR) 24 hr capsule 150 mg (150 mg Oral Given 12/11/23 2141)  levofloxacin  (LEVAQUIN ) tablet 500 mg (has no administration in time range)  docusate sodium  (COLACE) capsule 100 mg (100 mg Oral Given 12/11/23 2136)  pantoprazole  (PROTONIX ) EC tablet 40 mg (has no administration in time range)  tamsulosin  (FLOMAX ) capsule 0.4 mg (has no administration in time range)  clopidogrel  (PLAVIX ) tablet 75 mg (has no administration in time range)  rOPINIRole  (REQUIP ) tablet 0.5 mg (0.5 mg Oral Given 12/11/23 2140)  budeson-glycopyrrolate -formoterol  (BREZTRI ) 160-9-4.8 MCG/ACT inhaler 2 puff (has no administration in time range)  metoprolol  succinate (TOPROL -XL) 24 hr tablet 25 mg (has no administration in time range)  magnesium  sulfate IVPB 4 g 100 mL (has no administration in time range)  0.9 %  sodium chloride  infusion (has no administration in time range)  sodium chloride  0.9 % bolus 1,000 mL (0 mLs Intravenous Stopped 12/11/23 1635)  adenosine  (ADENOCARD ) 6 MG/2ML injection 6 mg (6 mg Intravenous Given 12/11/23 1526)  adenosine  (ADENOCARD ) 6 MG/2ML injection 6 mg (6 mg Intravenous Given 12/11/23 1530)  metoprolol  tartrate (LOPRESSOR ) injection 5 mg (5 mg Intravenous Given 12/11/23 1547)  adenosine  (ADENOCARD ) 6 MG/2ML injection 6 mg (6 mg Intravenous Given 12/11/23 1544)  metoprolol  tartrate (LOPRESSOR ) tablet 12.5 mg (12.5 mg Oral Given 12/11/23 1639)  piperacillin -tazobactam (ZOSYN ) IVPB 3.375 g (3.375 g Intravenous New Bag/Given 12/11/23 1814)  lactated ringers  bolus 1,500 mL (0 mLs Intravenous Stopped 12/11/23 1953)  methylPREDNISolone  sodium succinate  (SOLU-MEDROL ) 125 mg/2 mL injection 125 mg (125 mg Intravenous Given 12/11/23 1955)  potassium chloride  SA (KLOR-CON  M) CR tablet 40 mEq (40 mEq Oral Given 12/11/23 1959)     IMPRESSION / MDM / ASSESSMENT AND PLAN / ED COURSE  I reviewed the triage vital signs and the nursing notes.                               DDX/MDM/AP: Differential diagnosis includes, but is not limited to, primary arrhythmia driving syncopal episodes, consider underlying infection or metabolic abnormality contributing.  Doubt ACS.  Doubt pulmonary embolism at this time though will reassess.  Regard  to recent falls and syncopal episodes, patient does complain of neck pain, consider C-spine fracture despite reassuring exam or intracranial hemorrhage/skull fracture.  Appears to be in SVT.  Blood pressures with systolics in the 90s to 80s, maps 65-70.  Will trial adenosine .  Low threshold for electrical cardioversion.  Plan: - Cardiac pads placed -Attempted vagal maneuver (blow in syringe), failed --> adenosine  - Labs - EKG - Chest x-ray - CT head, CT C-spine - Anticipate admission given significant tachydysrhythmia here with notable syncopal episodes  Patient's presentation is most consistent with acute presentation with potential threat to life or bodily function.  The patient is on the cardiac monitor to evaluate for evidence of arrhythmia and/or significant heart rate changes.  ED course below.  Patient converted to sinus rhythm after initial 6 mg dose of adenosine  though shortly thereafter reverted back to SVT.  Given a second dose of 6 mg adenosine  and converted back to SVT about 5 minutes later.  Given third dose of 6 mg adenosine  with plan for loading with metoprolol  thereafter (did take his morning metoprolol  dose) --converted to sinus rhythm, briefly went back into SVT shortly after administration of metoprolol  then converted back to sinus rhythm and has been stable with a heart rate in the 80s. Workup notable for significant AKI with creatinine of 3.2 (was normal about 1 week ago), notable anion gap which is likely secondary to elevated BUN.  Fortunately mild hypokalemia.  Suspect decreased p.o. intake in setting of recent infection, giving IV fluid, will rule out underlying obstruction with CT abdomen pelvis.   Also plan for CT chest to evaluate for possible underlying pneumonia given recent URI symptoms and new leukocytosis.  CT imaging with no acute pathology and no obvious source of infection.  Patient with leukocytosis to 13 and lactate of 4.8, treated empirically for possible infection.  Admitted to medicine service.  Clinical Course as of 12/11/23 2349  Mon Dec 11, 2023  1517 Ecg = rate 157, recurrent supraventricular tachycardia, no gross evidence of ischemia on my initial read [MM]  1627 Trop wnl [MM]  1717 CT head with no acute pathology on my interpretation.  Shunt in place. [MM]  1719 CT abdomen pelvis with very distended bladder on my read.  Will attempt to have patient void, if unable or insufficient will place Foley catheter. [MM]  1722 Patient reevaluated, tells me he has been unable to void since last night.  Will proceed with Foley. [MM]  1746 Lactate elevated at 4.8.  Unclear if infection though patient has noted cough and does have new leukocytosis--will treat empirically for possible sepsis.  Received 1 L fluid on arrival, will give further 1.5 L to complete 30 cc/kg bolus.  Otherwise suspect lactic acid may be secondary to episode of hypotension associated with SVT on arrival. [MM]  1828 UA not c/w infxn [MM]  1831 Hospitalist consult order placed.  No obvious pathology on my review of CT scans. [MM]    Clinical Course User Index [MM] Collis Deaner, MD     FINAL CLINICAL IMPRESSION(S) / ED DIAGNOSES   Final diagnoses:  SVT (supraventricular tachycardia) (HCC)  Syncope, unspecified syncope type  AKI (acute kidney injury) (HCC)  Urinary retention     Rx / DC Orders   ED Discharge Orders     None        Note:  This document was prepared using Dragon voice recognition software and may include unintentional dictation errors.   Collis Deaner, MD  12/11/23 2349  

## 2023-12-11 NOTE — Progress Notes (Deleted)
 Pharmacy Antibiotic Note  Kirk Robinson is a 68 y.o. male admitted on 12/11/2023 with sepsis.  Pharmacy has been consulted for vancomycin  dosing.  Plan: Give vancomycin  1750 mg IV x1. Check random vanc level tomorrow given AKI (SCr 3.23, baseline 1.3 on November 10, 2023) Patient is also received Zosyn  3.375 g IV x 1 Continue to monitor renal function and follow culture results   Height: 5\' 9"  (175.3 cm) Weight: 77.1 kg (170 lb) IBW/kg (Calculated) : 70.7  Temp (24hrs), Avg:98 F (36.7 C), Min:98 F (36.7 C), Max:98 F (36.7 C)  Recent Labs  Lab 12/11/23 1507 12/11/23 1650  WBC 13.2*  --   CREATININE 3.23*  --   LATICACIDVEN  --  4.8*    CrCl cannot be calculated (Unknown ideal weight.).    Allergies  Allergen Reactions   Advair Diskus [Fluticasone-Salmeterol] Shortness Of Breath   Erythromycin Nausea And Vomiting   Iodinated Contrast Media Shortness Of Breath and Other (See Comments)   Ativan  [Lorazepam ] Other (See Comments)    Caused pt's heart to stop   Darvocet [Propoxyphene N-Acetaminophen ] Nausea And Vomiting   Doxycycline Hyclate Nausea And Vomiting, Swelling and Other (See Comments)    Tongue swelling, severe depression   Propoxyphene Nausea And Vomiting   Septra [Sulfamethoxazole-Trimethoprim] Rash    Antimicrobials this admission: 4/21 Vanc >>  4/21 Zosyn  x 1   Microbiology results: 4/21 BCx: IP  Thank you for allowing pharmacy to be a part of this patient's care.  Alice Innocent, PharmD Clinical Pharmacist  12/11/2023 6:30 PM

## 2023-12-11 NOTE — ED Provider Triage Note (Signed)
 Emergency Medicine Provider Triage Evaluation Note  Kirk Robinson , a 68 y.o. male  was evaluated in triage.  Pt complains of shortness of breath, sent by pulmonary, patient states heart rate will go up and oxygen goes down.  Review of Systems  Positive:  Negative:   Physical Exam  BP 106/87   Pulse (!) 173   Temp 98 F (36.7 C)   Resp (!) 21   SpO2 93%  Gen:   Awake, no distress   Resp:  Normal effort  MSK:   Moves extremities without difficulty  Other:  Patient tachycardic at 157 here in the ED  Medical Decision Making  Medically screening exam initiated at 3:05 PM.  Appropriate orders placed.  Landin Foland was informed that the remainder of the evaluation will be completed by another provider, this initial triage assessment does not replace that evaluation, and the importance of remaining in the ED until their evaluation is complete.  Patient will get an x-ray of the bed, EKG shows sinus tachycardia, does not show A-fib at this time   Delsie Figures, PA-C 12/11/23 1506

## 2023-12-11 NOTE — ED Triage Notes (Signed)
 Pt comes with c/o sob and tachycardia for about week. Pt has been passing out on wife. Pt wears 4L [place] at home. Pt on 4L currently and 02 94. Pt HR in 173 on dinamap.

## 2023-12-11 NOTE — Progress Notes (Signed)
 ED Pharmacy Antibiotic Sign Off An antibiotic consult was received from an ED provider for vancomycin  per pharmacy dosing for sepsis. A chart review was completed to assess appropriateness.   The following one time order(s) were placed:  Vancomycin  1750 mg IV x 1  Further antibiotic and/or antibiotic pharmacy consults should be ordered by the admitting provider if indicated.   Thank you for allowing pharmacy to be a part of this patient's care.   Alice Innocent, [facility]  Clinical Pharmacist 12/11/23 8:14 PM

## 2023-12-11 NOTE — ED Triage Notes (Signed)
 Arrives from Pulmonary. Has been on multiple antibiotics, currently taking Levaquin . BP today was 81/60, HR 189.  Wears 4l/ [place] home oxygen.

## 2023-12-12 DIAGNOSIS — I482 Chronic atrial fibrillation, unspecified: Secondary | ICD-10-CM | POA: Diagnosis present

## 2023-12-12 DIAGNOSIS — W19XXXA Unspecified fall, initial encounter: Secondary | ICD-10-CM | POA: Diagnosis present

## 2023-12-12 DIAGNOSIS — F419 Anxiety disorder, unspecified: Secondary | ICD-10-CM | POA: Diagnosis present

## 2023-12-12 DIAGNOSIS — Z8249 Family history of ischemic heart disease and other diseases of the circulatory system: Secondary | ICD-10-CM | POA: Diagnosis not present

## 2023-12-12 DIAGNOSIS — G894 Chronic pain syndrome: Secondary | ICD-10-CM | POA: Diagnosis present

## 2023-12-12 DIAGNOSIS — F32A Depression, unspecified: Secondary | ICD-10-CM | POA: Diagnosis present

## 2023-12-12 DIAGNOSIS — E039 Hypothyroidism, unspecified: Secondary | ICD-10-CM | POA: Diagnosis present

## 2023-12-12 DIAGNOSIS — Y92009 Unspecified place in unspecified non-institutional (private) residence as the place of occurrence of the external cause: Secondary | ICD-10-CM | POA: Diagnosis not present

## 2023-12-12 DIAGNOSIS — G919 Hydrocephalus, unspecified: Secondary | ICD-10-CM | POA: Diagnosis present

## 2023-12-12 DIAGNOSIS — E872 Acidosis, unspecified: Secondary | ICD-10-CM | POA: Diagnosis present

## 2023-12-12 DIAGNOSIS — E876 Hypokalemia: Secondary | ICD-10-CM | POA: Diagnosis present

## 2023-12-12 DIAGNOSIS — N1831 Chronic kidney disease, stage 3a: Secondary | ICD-10-CM | POA: Diagnosis present

## 2023-12-12 DIAGNOSIS — E1151 Type 2 diabetes mellitus with diabetic peripheral angiopathy without gangrene: Secondary | ICD-10-CM | POA: Diagnosis present

## 2023-12-12 DIAGNOSIS — I5021 Acute systolic (congestive) heart failure: Secondary | ICD-10-CM | POA: Diagnosis not present

## 2023-12-12 DIAGNOSIS — E7800 Pure hypercholesterolemia, unspecified: Secondary | ICD-10-CM | POA: Diagnosis present

## 2023-12-12 DIAGNOSIS — J441 Chronic obstructive pulmonary disease with (acute) exacerbation: Secondary | ICD-10-CM | POA: Diagnosis present

## 2023-12-12 DIAGNOSIS — B9789 Other viral agents as the cause of diseases classified elsewhere: Secondary | ICD-10-CM | POA: Diagnosis present

## 2023-12-12 DIAGNOSIS — Z7984 Long term (current) use of oral hypoglycemic drugs: Secondary | ICD-10-CM | POA: Diagnosis not present

## 2023-12-12 DIAGNOSIS — N170 Acute kidney failure with tubular necrosis: Secondary | ICD-10-CM | POA: Diagnosis present

## 2023-12-12 DIAGNOSIS — E1122 Type 2 diabetes mellitus with diabetic chronic kidney disease: Secondary | ICD-10-CM | POA: Diagnosis present

## 2023-12-12 DIAGNOSIS — J189 Pneumonia, unspecified organism: Secondary | ICD-10-CM | POA: Diagnosis present

## 2023-12-12 DIAGNOSIS — T380X5A Adverse effect of glucocorticoids and synthetic analogues, initial encounter: Secondary | ICD-10-CM | POA: Diagnosis present

## 2023-12-12 DIAGNOSIS — R339 Retention of urine, unspecified: Secondary | ICD-10-CM | POA: Diagnosis present

## 2023-12-12 DIAGNOSIS — E860 Dehydration: Secondary | ICD-10-CM | POA: Diagnosis present

## 2023-12-12 DIAGNOSIS — D72829 Elevated white blood cell count, unspecified: Secondary | ICD-10-CM | POA: Diagnosis present

## 2023-12-12 DIAGNOSIS — I471 Supraventricular tachycardia, unspecified: Secondary | ICD-10-CM | POA: Diagnosis present

## 2023-12-12 DIAGNOSIS — Z9981 Dependence on supplemental oxygen: Secondary | ICD-10-CM | POA: Diagnosis not present

## 2023-12-12 LAB — CBC
HCT: 33.9 % — ABNORMAL LOW (ref 39.0–52.0)
Hemoglobin: 11.1 g/dL — ABNORMAL LOW (ref 13.0–17.0)
MCH: 31.8 pg (ref 26.0–34.0)
MCHC: 32.7 g/dL (ref 30.0–36.0)
MCV: 97.1 fL (ref 80.0–100.0)
Platelets: 286 10*3/uL (ref 150–400)
RBC: 3.49 MIL/uL — ABNORMAL LOW (ref 4.22–5.81)
RDW: 14.5 % (ref 11.5–15.5)
WBC: 10.3 10*3/uL (ref 4.0–10.5)
nRBC: 0 % (ref 0.0–0.2)

## 2023-12-12 LAB — BASIC METABOLIC PANEL WITH GFR
Anion gap: 17 — ABNORMAL HIGH (ref 5–15)
BUN: 44 mg/dL — ABNORMAL HIGH (ref 8–23)
CO2: 25 mmol/L (ref 22–32)
Calcium: 7.6 mg/dL — ABNORMAL LOW (ref 8.9–10.3)
Chloride: 93 mmol/L — ABNORMAL LOW (ref 98–111)
Creatinine, Ser: 2.72 mg/dL — ABNORMAL HIGH (ref 0.61–1.24)
GFR, Estimated: 25 mL/min — ABNORMAL LOW (ref >60)
Glucose, Bld: 141 mg/dL — ABNORMAL HIGH (ref 70–99)
Potassium: 5.1 mmol/L (ref 3.5–5.1)
Sodium: 135 mmol/L (ref 135–145)

## 2023-12-12 LAB — CBG MONITORING, ED
Glucose-Capillary: 112 mg/dL — ABNORMAL HIGH (ref 70–99)
Glucose-Capillary: 113 mg/dL — ABNORMAL HIGH (ref 70–99)

## 2023-12-12 LAB — GLUCOSE, CAPILLARY
Glucose-Capillary: 216 mg/dL — ABNORMAL HIGH (ref 70–99)
Glucose-Capillary: 105 mg/dL — ABNORMAL HIGH (ref 70–99)

## 2023-12-12 LAB — LACTIC ACID, PLASMA
Lactic Acid, Venous: 2.5 mmol/L (ref 0.5–1.9)
Lactic Acid, Venous: 4.1 mmol/L (ref 0.5–1.9)
Lactic Acid, Venous: 4.4 mmol/L (ref 0.5–1.9)
Lactic Acid, Venous: 4.6 mmol/L (ref 0.5–1.9)

## 2023-12-12 LAB — HIV ANTIBODY (ROUTINE TESTING W REFLEX): HIV Screen 4th Generation wRfx: NONREACTIVE

## 2023-12-12 LAB — MAGNESIUM: Magnesium: 3.2 mg/dL — ABNORMAL HIGH (ref 1.7–2.4)

## 2023-12-12 MED ORDER — IPRATROPIUM BROMIDE 0.02 % IN SOLN
0.5000 mg | Freq: Three times a day (TID) | RESPIRATORY_TRACT | Status: DC
  Filled 2023-12-12: qty 2.5

## 2023-12-12 MED ORDER — LEVOFLOXACIN 500 MG PO TABS
250.0000 mg | ORAL_TABLET | Freq: Every day | ORAL | Status: DC
  Administered 2023-12-12 – 2023-12-13 (×2): 250 mg via ORAL
  Filled 2023-12-12 (×2): qty 1

## 2023-12-12 NOTE — Evaluation (Addendum)
 Physical Therapy Evaluation Patient Details Name: Kirk Robinson MRN: [id number] DOB: May 03, 1956 Today's Date: 12/12/2023  History of Present Illness  Kirk Robinson is a 67yoM comes [facility] from pulmonology after noted hypotension and tachycardia. Pt has been sick at home, multiple recent falls, questionable syncope. PMH: COPD, pulmonary fibrosis, 4L O2 use at home, HLD, DM. Pt has had some infrequent falls typically associated with jello legs and buckling. 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 At baseline pt typically modI for ADL performance, limited household AMB on 4L O2, c ad lib device use.  Clinical Impression  Pt agreeable to assessment despite continued acute pain and weakess. Pt is hypotensive still, orthostatic in standing, but improvement after return to sitting. Pt uses moderate effort to perform bed mobility and transfer, however requires no physical assistance or technique/safety cues. Pt unable to attemp AMB at this time. Education on use of transport chair in home at DC should orthostatic intolerance remain an issue- will also facilitate appointments out of house given baseline intolerance of AMB >limited household distances. Will continue to follow.   Orthostatic VS for the past 24 hrs:  BP- Lying Pulse- Lying BP- Sitting Pulse- Sitting BP- Standing at 0 minutes Pulse- Standing at 0 minutes  12/12/23 1023 (!) (P) 83/71 (P) 71 (P) 95/70 (P) 89 (!) (P) 74/63 (P) 90           If plan is discharge home, recommend the following: Assist for transportation;Assistance with cooking/housework;Help with stairs or ramp for entrance;A little help with walking and/or transfers   Can travel by private vehicle        Equipment Recommendations Other (comment) (transport chair)  Recommendations for Other Services       Functional Status Assessment Patient has had a recent  decline in their functional status and demonstrates the ability to make significant improvements in function in a reasonable and predictable amount of time.     Precautions / Restrictions Precautions Precautions: Fall Recall of Precautions/Restrictions: Intact Restrictions Weight Bearing Restrictions Per Provider Order: No      Mobility  Bed Mobility Overal bed mobility: Needs Assistance Bed Mobility: Supine to Sit, Sit to Supine     Supine to sit: Supervision, HOB elevated Sit to supine: Supervision, HOB elevated   General bed mobility comments: Chartered loss adjuster manages leads, foley    Transfers Overall transfer level: Needs assistance Equipment used: Rolling walker (2 wheels) Transfers: Sit to/from Stand Sit to Stand: Supervision, Contact guard assist           General transfer comment: stands for BP assessment, no LOB, HR stable and irregular, weakness in legs reported.    Ambulation/Gait Ambulation/Gait assistance:  (pt feels ill, feel sunsafe to attempt AMB at this time)                Stairs            Wheelchair Mobility     Tilt Bed    Modified Rankin (Stroke Patients Only)       Balance Overall balance assessment: Modified Independent, History of Falls                                           Pertinent Vitals/Pain Pain Assessment Pain Assessment: Faces Faces Pain Scale: Hurts little more Pain Location: neck  Pain Intervention(s): Limited activity within patient's tolerance, Monitored during session    Home Living Family/patient expects to be discharged to:: Private residence Living Arrangements: Spouse/significant other;Children;Other relatives (Wife (retired), DTR (home all the time), 2 grandchildren 12yo and 81yo.) Available Help at Discharge: Family Type of Home: House Home Access: Stairs to enter Entrance Stairs-Rails: Doctor, general practice of Steps: 7 back, 6 back   Home Layout: One level Home  Equipment: Rollator (4 wheels);Cane - single point Additional Comments: sleeps in recliner, bed,bathroom on upper level (split level 6 stairs); has floor concentrator in house and portable concentrator for appointments.    Prior Function Prior Level of Function : Independent/Modified Independent             Mobility Comments: limited household AMB with 4L O2 and 4WW or SPC ADLs Comments: modified independent at baseline.     Extremity/Trunk Assessment                Communication        Cognition Arousal: Alert Behavior During Therapy: WFL for tasks assessed/performed   PT - Cognitive impairments: No apparent impairments                                 Cueing       General Comments      Exercises     Assessment/Plan    PT Assessment Patient needs continued PT services  PT Problem List Decreased strength;Decreased activity tolerance;Decreased balance;Decreased mobility       PT Treatment Interventions DME instruction;Gait training;Stair training;Functional mobility training;Therapeutic activities;Balance training;Therapeutic exercise;Neuromuscular re-education;Patient/family education    PT Goals (Current goals can be found in the Care Plan section)  Acute Rehab PT Goals Patient Stated Goal: regain strenth, avoid falls, return to home PT Goal Formulation: With patient Time For Goal Achievement: 12/26/23 Potential to Achieve Goals: Fair    Frequency Min 2X/week     Co-evaluation               AM-PAC PT "6 Clicks" Mobility  Outcome Measure Help needed turning from your back to your side while in a flat bed without using bedrails?: A Little Help needed moving from lying on your back to sitting on the side of a flat bed without using bedrails?: A Little Help needed moving to and from a bed to a chair (including a wheelchair)?: A Little Help needed standing up from a chair using your arms (e.g., wheelchair or bedside chair)?: A  Little Help needed to walk in hospital room?: A Lot Help needed climbing 3-5 steps with a railing? : A Lot 6 Click Score: 16    End of Session Equipment Utilized During Treatment: Oxygen Activity Tolerance: Patient tolerated treatment well;No increased pain;Patient limited by fatigue;Treatment limited secondary to medical complications (Comment) Patient left: in bed;with call bell/phone within reach;with family/visitor present Nurse Communication: Mobility status PT Visit Diagnosis: Difficulty in walking, not elsewhere classified (R26.2);Other abnormalities of gait and mobility (R26.89);Other symptoms and signs involving the nervous system (R29.898);Muscle weakness (generalized) (M62.81)    Time: 1610-9604 PT Time Calculation (min) (ACUTE ONLY): 32 min   Charges:   PT Evaluation $PT Eval Moderate Complexity: 1 Mod PT Treatments $Therapeutic Activity: 8-22 mins PT General Charges $$ ACUTE PT VISIT: 1 Visit        10:56 AM, 12/12/23 Dawn Eth, PT, DPT Physical Therapist - [facility]  [phone] [facility])  Sakiyah Shur C 12/12/2023, 10:54 AM

## 2023-12-12 NOTE — Evaluation (Signed)
 Occupational Therapy Evaluation Patient Details Name: Kirk Robinson MRN: [id number] DOB: Mar 16, 1956 Today's Date: 12/12/2023   History of Present Illness   Kirk Robinson is a 67yoM comes to [facility] from pulmonology after noted hypotension and tachycardia. Pt has been sick at home, multiple recent falls, questionable syncope. PMH: COPD, pulmonary fibrosis, 4L O2 use at home, HLD, DM. Pt has had some infrequent falls typically associated with jello legs and buckling. 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 At baseline pt typically modI for ADL performance, limited household AMB on 4L O2, c ad lib device use.   Clinical Impressions Pt was seen for OT evaluation this date. Prior to hospital admission, pt was indep with ADL and using SPC rarely. Pt lives with his spouse in a split level home with 7 steps to get to the full bath and bedrooms once inside. Several steps to get into the home from the front and back. Pt presents to acute OT demonstrating impaired ADL performance and functional mobility 2/2 weakness brought on with positional changes with +orthostatics (See OT problem list for additional functional deficits). Anticipate pt will require PRN MIN A for LB ADL tasks to prevent additional positional changes that may trigger symptoms. BP low in supine 88/68 so EOB/OOB deferred. Pt would benefit from skilled OT services to address noted impairments and functional limitations (see below for any additional details) in order to maximize safety and independence while minimizing falls risk and caregiver burden. Anticipate the need for follow up OT services upon acute hospital DC.   If plan is discharge home, recommend the following:   A little help with walking and/or transfers;A little help with bathing/dressing/bathroom;Assistance with cooking/housework;Assist for transportation;Help with stairs  or ramp for entrance     Functional Status Assessment   Patient has had a recent decline in their functional status and demonstrates the ability to make significant improvements in function in a reasonable and predictable amount of time.     Equipment Recommendations BSC/3in1;Other (comment) (transport chair)    Precautions/Restrictions   Precautions Precautions: Fall Recall of Precautions/Restrictions: Intact Precaution/Restrictions Comments: monitor orthostatics Restrictions Weight Bearing Restrictions Per Provider Order: No   Mobility Bed Mobility   General bed mobility comments: deferred 2/2 low BP in supine 88/68    Transfers       ADL either performed or assessed with clinical judgement   ADL   General ADL Comments: Limited functional assessment 2/2 low BP and orthostatic, anticipate need for PRN MIN A for LB ADL. Spouse present and endorses ability to assist at discharge.    Pertinent Vitals/Pain Pain Assessment Pain Assessment: 0-10 Pain Score: 8  Pain Location: neck Pain Descriptors / Indicators: Aching, Grimacing, Guarding Pain Intervention(s): Limited activity within patient's tolerance, Monitored during session, Premedicated before session, Repositioned, Patient requesting pain meds-RN notified      Communication Communication Communication: No apparent difficulties   Cognition Arousal: Alert Behavior During Therapy: WFL for tasks assessed/performed Cognition: No apparent impairments       Following commands: Intact     Cueing  General Comments   Cueing Techniques: Verbal cues      Exercises Other Exercises Other Exercises: Pt/spouse educated in positional changes and strategies to prevent symptoms 2/2 orthostatic hypotension and AE/DME to promote independence and safety    Home Living Family/patient expects to be discharged to:: Private residence Living Arrangements: Spouse/significant other;Children;Other relatives (Wife (retired), DTR  (home all  the time), 2 grandchildren 12yo and 16yo.) Available Help at Discharge: Family Type of Home: House Home Access: Stairs to enter [employer] of Steps: 7 back, 6 back Entrance Stairs-Rails: Right;Left Home Layout: Multi-level Alternate Level Stairs-Number of Steps: split level, 7 steps to get to beds/baths after getting into the house     Home Equipment: Cane - single Librarian, academic (2 wheels);Shower seat   Additional Comments: sleeps in recliner, bed,bathroom on upper level (split level 6 stairs); has floor concentrator in house and portable concentrator for appointments.      Prior Functioning/Environment Prior Level of Function : Independent/Modified Independent       Mobility Comments: limited household AMB with 4L O2 and 4WW or SPC ADLs Comments: modified independent at baseline.    OT Problem List: Pain;Cardiopulmonary status limiting activity;Decreased knowledge of use of DME or AE   OT Treatment/Interventions: Self-care/ADL training;Therapeutic exercise;Therapeutic activities;DME and/or AE instruction;Patient/family education;Balance training      OT Goals(Current goals can be found in the care plan section)   Acute Rehab OT Goals Patient Stated Goal: get better OT Goal Formulation: With patient/family Time For Goal Achievement: 12/26/23 Potential to Achieve Goals: Good ADL Goals Pt Will Perform Lower Body Dressing: with modified independence;sitting/lateral leans Pt Will Transfer to Toilet: with modified independence (amb vs transport chair, LRAD) Pt Will Perform Toileting - Clothing Manipulation and hygiene: with modified independence;sitting/lateral leans Pt Will Perform Tub/Shower Transfer: with modified independence   OT Frequency:  Min 1X/week       AM-PAC OT "6 Clicks" Daily Activity     Outcome Measure Help from another person eating meals?: None Help from another person taking care of personal grooming?: None Help from  another person toileting, which includes using toliet, bedpan, or urinal?: A Little Help from another person bathing (including washing, rinsing, drying)?: A Little Help from another person to put on and taking off regular upper body clothing?: None Help from another person to put on and taking off regular lower body clothing?: A Little 6 Click Score: 21   End of Session Nurse Communication: Patient requests pain meds;Other (comment) (BP)  Activity Tolerance: Treatment limited secondary to medical complications (Comment) (low bp) Patient left: in bed;with call bell/phone within reach;with family/visitor present  OT Visit Diagnosis: Other abnormalities of gait and mobility (R26.89);History of falling (Z91.81)                Time: 4098-1191 OT Time Calculation (min): 16 min Charges:  OT General Charges $OT Visit: 1 Visit OT Evaluation $OT Eval Moderate Complexity: 1 Mod  Berenda Breaker., MPH, MS, OTR/L ascom [phone] 12/12/23, 12:50 PM

## 2023-12-12 NOTE — ED Notes (Signed)
 Pt given diet shasta cola, per request and diet order.

## 2023-12-12 NOTE — TOC Progression Note (Signed)
 Transition of Care [facility]) - Progression Note    Patient Details  Name: Kirk Robinson MRN: [id number] Date of Birth: November 14, 1955  Transition of Care [facility]) CM/SW Contact  Elmira Haddock, LCSW Phone Number: 12/12/2023, 12:23 PM  Clinical Narrative:   Transport chair requested by Physical Therapy.  Patient is amenable to getting chair.  PT also recommending HHPT.  [facility] contacted to provide services.  T TOC to continue to follow for any d/c needs that may arise during this hospital stay.         Expected Discharge Plan and Services                                               Social Determinants of Health (SDOH) Interventions SDOH Screenings   Food Insecurity: No Food Insecurity (12/12/2023)  Housing: Low Risk  (12/12/2023)  Transportation Needs: No Transportation Needs (12/12/2023)  Utilities: Not At Risk (12/12/2023)  Depression (PHQ2-9): Medium Risk (01/19/2022)  Financial Resource Strain: Low Risk  (05/10/2023)   Received from [facility]  Physical Activity: Inactive (04/20/2021)   Received from [facility], [facility]  Social Connections: Socially Integrated (12/12/2023)  Stress: No Stress Concern Present (04/20/2021)   Received from [facility], [facility]  Tobacco Use: Medium Risk (12/11/2023)    Readmission Risk Interventions     No data to display

## 2023-12-12 NOTE — Progress Notes (Signed)
 PHARMACY NOTE:  ANTIMICROBIAL RENAL DOSAGE ADJUSTMENT  Current antimicrobial regimen includes a mismatch between antimicrobial dosage and estimated renal function.  As per policy approved by the Pharmacy & Therapeutics and Medical Executive Committees, the antimicrobial dosage will be adjusted accordingly.  Current antimicrobial dosage: Levofloxacin  500 mg PO daily (Rx'd 12/08/23 outpatient x 10d)  Indication: COPDE  Renal Function:  Estimated Creatinine Clearance: 27.1 mL/min (A) (by C-G formula based on SCr of 2.72 mg/dL (H)).    Antimicrobial dosage has been changed to:  Levofloxacin  250 mg PO daily  Additional comments: Tested positive for rhinovirus 12/07/23  Thank you for allowing pharmacy to be a part of this patient's care.  Page Boast, [facility] 12/12/2023 8:45 AM

## 2023-12-12 NOTE — ED Notes (Signed)
 Fall precautions in place for Pt. This RN placed fall band, fall grip socks, bed alarm and fall sign.

## 2023-12-12 NOTE — ED Notes (Signed)
 Covering Provider, NP [facility] notified of Pt status and vitals d/t BP running low. Provider OK with MAP >65. No new orders.

## 2023-12-12 NOTE — ED Notes (Signed)
 Home medication taken to pharmacy at this time.

## 2023-12-12 NOTE — Progress Notes (Addendum)
 PROGRESS NOTE   HPI was taken from Dr. Rosalea Collin:  Kirk Robinson is a 68 y.o. 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   Pt states that he has worsening shortness of breath for more than a week. He had positive test for rhinovirus on 02/06/2024. Pt is taking methylprednisolone  and Levaquin  for possible pneumonia, without improvement. Patient was seen by [name] of pulmonology today, found to have tachycardia with heart rate 189, and hypotension with blood pressure 81/60. Pt is sent to ED for further evaluation and treatment. Pt was found to have SVT with HR up to 173 in ED. her blood pressure is 94/75 in ED.  Pt was given total of 3 dose of 6 mg of adenosine , and 5 mg of IV metoprolol , converted to sinus rhythm.  Currently heart rate is in the 80s.    Per EDP, his wife reported that pt Has had couple falls at home which were witnessed, and 2 episodes of syncope today which lasted for few seconds.  Notably patient had tachycardia and oxygen desaturation on his normal 4L oxygen at home.  Currently patient has dry cough, no chest pain.  Patient states that he had fever few days ago, which has resolved.  Currently no fever or chills.  He has nausea, no vomiting, diarrhea or abdominal pain.  No symptoms of UTI.  Patient was found to have acute urinary retention, and Foley catheter is placed in the ED.  Patient denies drug use or drinking alcohol,   Data reviewed independently and ED Course: pt was found to have WBC 13.2, potassium 3.3, magnesium  1.1, phosphorus 5.9, negative UA, troponin 14 --> 16, lactic acid 4.8, worsening renal function, temperature normal, blood pressure 94/75, 108/71, RR 21 --> 18, oxygen saturation 99% on home level of 4L oxygen currently.  Patient is placed  in PCU for observation.     Kirk Robinson  ZOX:[id number] DOB: [birth year] DOA: 12/11/2023 PCP: [name], MD   Assessment & Plan:   Principal Problem:   SVT (supraventricular tachycardia) [facility]) Active Problems:   Atrial fibrillation, chronic (HCC)   Syncope   Fall at home, initial encounter   COPD exacerbation (HCC)   Pulmonary fibrosis (HCC)   Rhinovirus infection   Leukocytosis   Acute renal failure superimposed on stage 3a chronic kidney disease (HCC)   Adult hypothyroidism   Type II diabetes mellitus with renal manifestations (HCC)   Stroke (HCC)   Hypokalemia   Hypomagnesemia   Acute urinary retention   Elevated lactic acid level   Chronic pain syndrome   Anxiety and depression  Assessment and Plan: SVT: s/p adenosine  x 2  & IV lopressor  given in the ER. Continue on increased dose of metoprolol . Pt denies missing a dose of metoprolol    Chronic a. fib: continue on increased dose of metoprolol . Not on chronic anticoagulation as per med rec.    Syncope: Likely due to SVT. CT head negative for acute intracranial abnormalities. Continue on tele    Fall: at home. PT/OT consulted    COPD exacerbation: w/ hx of pulmonary fibrosis. Recent rhinovirus infection. Continue on IV steroids, bronchodilators & encourage incentive spirometry    Rhinovirus infection: recent, dx on 4/17. Continue w/ supportive care   AKI on CKDIIIa: baseline Cr 1.4 on 07/13/2023. Likely secondary to dehydration &  possible ATN. Cr is trending down today    Hypothyroidism: continue on home dose of levothyroxine    DM2: well controlled, HbA1c 5.8. Continue on SSI w/ accuchecks  Hx of CVA: continue on plavix , statin    HLD: continue on statin    Hypokalemia: WNL today  Hypomagnesemia: resolved    Acute urinary retention: foley placed in ER. Recommend voiding trial prior to d/c. Continue on flomax     Elevated lactic acid level: continue on IVFs. Repeat lactic acid ordered    Hydrocephalus: has VP shunt, present on admission. Continue w/ supportive care    Chronic pain syndrome: continue on home dose of MS contin     Depression: severity unknown. Continue on home dose of abilify , mirtazapine    Anxiety: severity unknown. Continue on home dose of valium       DVT prophylaxis: heparin   Code Status: full  Family Communication: discussed pt's care w/ pt's family at bedside and answered their questions  Disposition Plan: depends on OT/PT recs   Level of care: Progressive  Status is: Inpatient Remains inpatient appropriate because: severity of illness, requiring IV steroids     Consultants:    Procedures:  Antimicrobials:   Subjective: Pt c/o malaise  Objective: Vitals:   12/12/23 0530 12/12/23 0630 12/12/23 0730 12/12/23 0800  BP: (!) 88/66 95/67 95/62  94/70  Pulse: 69 69 66 76  Resp: 14 (!) 7 13 13   Temp:      TempSrc:      SpO2: 96% 99% 99% 100%  Weight:      Height:        Intake/Output Summary (Last 24 hours) at 12/12/2023 0913 Last data filed at 12/12/2023 0333 Gross per 24 hour  Intake 97.57 ml  Output 1650 ml  Net -1552.43 ml   Filed Weights   12/11/23 1832  Weight: 77.1 kg    Examination:  General exam: Appears calm and comfortable  Respiratory system: decreased breath sounds b/l  Cardiovascular system: S1 & S2 +. No rubs, gallops or clicks. Gastrointestinal system: Abdomen is nondistended, soft and nontender. Normal bowel sounds heard. Central nervous system: Alert and awake. Moves all extremities  Psychiatry: Judgement and insight appear normal. Flat mood and affect    Data Reviewed: I have personally reviewed following labs and imaging studies  CBC: Recent Labs  Lab 12/11/23 1507 12/12/23 0425  WBC 13.2* 10.3  HGB 14.0 11.1*  HCT 41.7 33.9*  MCV 94.3 97.1  PLT 381 286   Basic Metabolic Panel: Recent Labs  Lab 12/11/23 1507 12/11/23 1755 12/12/23 0425  NA 136  --  135  K 3.3*  --  5.1  CL 87*   --  93*  CO2 26  --  25  GLUCOSE 155*  --  141*  BUN 42*  --  44*  CREATININE 3.23*  --  2.72*  CALCIUM  8.7*  --  7.6*  MG  --  1.1* 3.2*  PHOS  --  5.9*  --    GFR: Estimated Creatinine Clearance: 27.1 mL/min (A) (by C-G formula based on SCr of 2.72 mg/dL (H)). Liver Function Tests: No results for input(s): "AST", "ALT", "ALKPHOS", "BILITOT", "PROT", "ALBUMIN" in the last 168 hours. No results for input(s): "LIPASE", "AMYLASE" in the last 168 hours. No results for input(s): "AMMONIA" in the last 168 hours. Coagulation Profile: Recent Labs  Lab 12/11/23 1543  INR 1.1   Cardiac Enzymes: No results for input(s): "CKTOTAL", "CKMB", "CKMBINDEX", "TROPONINI" in the last 168 hours. BNP (last 3 results)  No results for input(s): "PROBNP" in the last 8760 hours. HbA1C: No results for input(s): "HGBA1C" in the last 72 hours. CBG: Recent Labs  Lab 12/11/23 2145 12/12/23 0752  GLUCAP 104* 112*   Lipid Profile: No results for input(s): "CHOL", "HDL", "LDLCALC", "TRIG", "CHOLHDL", "LDLDIRECT" in the last 72 hours. Thyroid  Function Tests: Recent Labs    12/11/23 1755  TSH 8.014*   Anemia Panel: No results for input(s): "VITAMINB12", "FOLATE", "FERRITIN", "TIBC", "IRON", "RETICCTPCT" in the last 72 hours. Sepsis Labs: Recent Labs  Lab 12/11/23 1755 12/12/23 0001 12/12/23 0129 12/12/23 0425  LATICACIDVEN 4.8* 4.6* 4.4* 4.1*    Recent Results (from the past 240 hours)  Blood Culture (routine x 2)     Status: None (Preliminary result)   Collection Time: 12/11/23  4:50 PM   Specimen: BLOOD  Result Value Ref Range Status   Specimen Description BLOOD BLOOD RIGHT ARM  Final   Special Requests   Final    BOTTLES DRAWN AEROBIC AND ANAEROBIC Blood Culture adequate volume   Culture   Final    NO GROWTH < 12 HOURS Performed at [facility], [address], [place], [place] [address]    Report Status PENDING  Incomplete  Blood Culture (routine x 2)     Status: None  (Preliminary result)   Collection Time: 12/11/23  4:55 PM   Specimen: BLOOD  Result Value Ref Range Status   Specimen Description BLOOD BLOOD RIGHT HAND  Final   Special Requests   Final    BOTTLES DRAWN AEROBIC AND ANAEROBIC Blood Culture results may not be optimal due to an inadequate volume of blood received in culture bottles   Culture   Final    NO GROWTH < 12 HOURS Performed at [facility], [address] [place], [place] [address]    Report Status PENDING  Incomplete         Radiology Studies: CT CHEST ABDOMEN PELVIS WO CONTRAST Result Date: 12/11/2023 CLINICAL DATA:  Cough, intermittent hypoxia, concern for pneumonia. History of COPD, ILD. Significant AKI, noncontrast study please EXAM: CT CHEST, ABDOMEN AND PELVIS WITHOUT CONTRAST TECHNIQUE: Multidetector CT imaging of the chest, abdomen and pelvis was performed following the standard protocol without IV contrast. RADIATION DOSE REDUCTION: This exam was performed according to the departmental dose-optimization program which includes automated exposure control, adjustment of the mA and/or kV according to patient size and/or use of iterative reconstruction technique. COMPARISON:  None Available. FINDINGS: CHEST: Cardiovascular: The thoracic aorta is normal in caliber. The heart is normal in size. No significant pericardial effusion. Four-vessel coronary calcification. Moderate atherosclerotic plaque of the thoracic aorta. Lungs/Pleura: Emphysematous changes. Redemonstration of peripheral reticulations with cystic changes more prominent along the lower lobes. No focal consolidation. No pulmonary nodule. No pulmonary mass. No pulmonary contusion or laceration. No pneumatocele formation. No pleural effusion. No pneumothorax. No hemothorax. Mediastinum/Nodes: No pneumomediastinum. The central airways are patent. The esophagus is unremarkable. The thyroid  is unremarkable. Limited evaluation for hilar lymphadenopathy on this  noncontrast study. No mediastinal or axillary lymphadenopathy. Musculoskeletal/Chest wall No chest wall mass. No acute rib or sternal fracture. No spinal fracture. ABDOMEN / PELVIS: Hepatobiliary: Not enlarged. No focal lesion. The gallbladder is otherwise unremarkable with no radio-opaque gallstones. No biliary ductal dilatation. Pancreas: Diffusely atrophic parenchyma. Normal pancreatic contour. No main pancreatic duct dilatation. Spleen: Not enlarged. No focal lesion. Adrenals/Urinary Tract: No nodularity bilaterally. No hydroureteronephrosis. No nephroureterolithiasis. No contour deforming renal mass. The urinary bladder is unremarkable. Stomach/Bowel: No small  or large bowel wall thickening or dilatation. The appendix is unremarkable. Vasculature/Lymphatic: Severe atherosclerotic plaque. Left common/external iliac stent-not well visualized and not evaluated on this noncontrast study. No abdominal aorta or iliac aneurysm. No abdominal, pelvic, inguinal lymphadenopathy. Reproductive: Prostate is unremarkable. Other: No simple free fluid ascites. No pneumoperitoneum. No mesenteric hematoma identified. No organized fluid collection. Musculoskeletal: No significant soft tissue hematoma. No acute pelvic fracture. No spinal fracture. Bilateral L5 pars interarticularis defects with grade 1 anterolisthesis of L5 on S1. Other ports and devices: Left chest wall and abdominal wall soft tissue ventriculoperitoneal shunt with tip terminating in the left lower abdominal quadrant. No kinking or discontinuity of the shunt identified. IMPRESSION: 1. No acute intrathoracic, intra-abdominal, intrapelvic traumatic injury with limited evaluation on this noncontrast study. 2. No acute fracture or traumatic malalignment of the thoracic or lumbar spine. 3. Emphysema (ICD10-J43.9) and interstitial lung disease. 4.  Aortic Atherosclerosis (ICD10-I70.0). Electronically Signed   By: Morgane  Naveau M.D.   On: 12/11/2023 20:02   CT HEAD  WO CONTRAST ( ) Result Date: 12/11/2023 CLINICAL DATA:  Trauma Cough, intermittent hypoxia, concern for pneumonia. History of COPD, ILD. Significant AKI, noncontrast study please EXAM: CT HEAD WITHOUT CONTRAST CT CERVICAL SPINE WITHOUT CONTRAST TECHNIQUE: Multidetector CT imaging of the head and cervical spine was performed following the standard protocol without intravenous contrast. Multiplanar CT image reconstructions of the cervical spine were also generated. RADIATION DOSE REDUCTION: This exam was performed according to the departmental dose-optimization program which includes automated exposure control, adjustment of the mA and/or kV according to patient size and/or use of iterative reconstruction technique. COMPARISON:  CT head and C-spine 05/11/2019 FINDINGS: CT HEAD FINDINGS Brain: Left frontal approach ventriculoperitoneal shunt with tip terminating slightly right of midline in the region of the foramen Monro. Patchy and confluent areas of decreased attenuation are noted throughout the deep and periventricular white matter of the cerebral hemispheres bilaterally, compatible with chronic microvascular ischemic disease. No evidence of large-territorial acute infarction. No parenchymal hemorrhage. No mass lesion. No extra-axial collection. No mass effect or midline shift. No hydrocephalus. Stable Slit-like ventricles. Basilar cisterns are patent. Vascular: No hyperdense vessel. Skull: No acute fracture or focal lesion. Sinuses/Orbits: Paranasal sinuses and mastoid air cells are clear. The orbits are unremarkable. Other: None. CT CERVICAL SPINE FINDINGS Alignment: Grade 1 anterolisthesis of C3 on C4 and C4 on C5. Skull base and vertebrae: Multilevel moderate degenerative changes of the spine. Severe degenerative changes at C1-C2 level with question of old fracture-stable. No severe osseous neural foraminal or central canal stenosis. No acute fracture. No aggressive appearing focal osseous lesion or focal  pathologic process. Soft tissues and spinal canal: No prevertebral fluid or swelling. No visible canal hematoma. Upper chest: Emphysematous changes. Otherwise please see separately dictated CT chest 12/11/2023. Other: None. IMPRESSION: 1. No acute traumatic intracranial abnormality. 2. Stable Slit-like ventricles with ventriculoperitoneal shunt in grossly appropriate position. 3. No acute displaced fracture or traumatic listhesis of the cervical spine. 4. Emphysema (ICD10-J43.9). Otherwise please see separately dictated CT chest 12/11/2023. Electronically Signed   By: Morgane  Naveau M.D.   On: 12/11/2023 19:50   CT Cervical Spine Wo Contrast Result Date: 12/11/2023 CLINICAL DATA:  Trauma Cough, intermittent hypoxia, concern for pneumonia. History of COPD, ILD. Significant AKI, noncontrast study please EXAM: CT HEAD WITHOUT CONTRAST CT CERVICAL SPINE WITHOUT CONTRAST TECHNIQUE: Multidetector CT imaging of the head and cervical spine was performed following the standard protocol without intravenous contrast. Multiplanar CT image reconstructions of the cervical spine were  also generated. RADIATION DOSE REDUCTION: This exam was performed according to the departmental dose-optimization program which includes automated exposure control, adjustment of the mA and/or kV according to patient size and/or use of iterative reconstruction technique. COMPARISON:  CT head and C-spine 05/11/2019 FINDINGS: CT HEAD FINDINGS Brain: Left frontal approach ventriculoperitoneal shunt with tip terminating slightly right of midline in the region of the foramen Monro. Patchy and confluent areas of decreased attenuation are noted throughout the deep and periventricular white matter of the cerebral hemispheres bilaterally, compatible with chronic microvascular ischemic disease. No evidence of large-territorial acute infarction. No parenchymal hemorrhage. No mass lesion. No extra-axial collection. No mass effect or midline shift. No  hydrocephalus. Stable Slit-like ventricles. Basilar cisterns are patent. Vascular: No hyperdense vessel. Skull: No acute fracture or focal lesion. Sinuses/Orbits: Paranasal sinuses and mastoid air cells are clear. The orbits are unremarkable. Other: None. CT CERVICAL SPINE FINDINGS Alignment: Grade 1 anterolisthesis of C3 on C4 and C4 on C5. Skull base and vertebrae: Multilevel moderate degenerative changes of the spine. Severe degenerative changes at C1-C2 level with question of old fracture-stable. No severe osseous neural foraminal or central canal stenosis. No acute fracture. No aggressive appearing focal osseous lesion or focal pathologic process. Soft tissues and spinal canal: No prevertebral fluid or swelling. No visible canal hematoma. Upper chest: Emphysematous changes. Otherwise please see separately dictated CT chest 12/11/2023. Other: None. IMPRESSION: 1. No acute traumatic intracranial abnormality. 2. Stable Slit-like ventricles with ventriculoperitoneal shunt in grossly appropriate position. 3. No acute displaced fracture or traumatic listhesis of the cervical spine. 4. Emphysema (ICD10-J43.9). Otherwise please see separately dictated CT chest 12/11/2023. Electronically Signed   By: Morgane  Naveau M.D.   On: 12/11/2023 19:50   DG Chest Port 1 View Result Date: 12/11/2023 CLINICAL DATA:  Shortness of breath.  Tachycardia. EXAM: PORTABLE CHEST 1 VIEW COMPARISON:  X-ray 05/10/2019. chest CT 03/23/2022. FINDINGS: Overlapping cardiac leads and defibrillator pads. Presumed VP shunt catheter tubing running vertically along the left hemithorax. Enlarged cardiopericardial silhouette. No pneumothorax or effusion. Interstitial changes again seen of the lungs, likely chronic. Films are under penetrated. Apical pleural thickening. IMPRESSION: Diffuse interstitial lung changes, likely chronic. Enlarged cardiac silhouette. Presumed VP shunt catheter tubing running vertically along the left hemithorax.  Electronically Signed   By: Adrianna Horde M.D.   On: 12/11/2023 18:56        Scheduled Meds:  ARIPiprazole   5 mg Oral QPM   aspirin  EC  81 mg Oral Daily   atorvastatin   20 mg Oral q1800   budeson-glycopyrrolate -formoterol   2 puff Inhalation BID   clopidogrel   75 mg Oral Daily   diazepam   5 mg Oral Q12H   docusate sodium   100 mg Oral TID   fentaNYL  (SUBLIMAZE ) injection  50 mcg Intravenous Once   heparin  injection (subcutaneous)  5,000 Units Subcutaneous Q8H   insulin  aspart  0-5 Units Subcutaneous QHS   insulin  aspart  0-9 Units Subcutaneous TID WC   ipratropium  0.5 mg Nebulization Q4H   levofloxacin   250 mg Oral Daily   methylPREDNISolone  (SOLU-MEDROL ) injection  80 mg Intravenous Daily   metoprolol  succinate  25 mg Oral Daily   mirtazapine   45 mg Oral QHS   morphine   30 mg Oral Q12H   pantoprazole   40 mg Oral BID AC   rOPINIRole   0.5 mg Oral QHS   tamsulosin   0.4 mg Oral Daily   venlafaxine  XR  150 mg Oral QPM   Continuous Infusions:  sodium chloride  75 mL/hr  at 12/12/23 0028     LOS: 0 days       Alphonsus Jeans, MD [facility] Pager [phone]  If 7PM-7AM, please contact night-coverage [email or web address] 12/12/2023, 9:13 AM

## 2023-12-12 NOTE — Plan of Care (Signed)

## 2023-12-12 NOTE — Care Management Obs Status (Signed)
 MEDICARE OBSERVATION STATUS NOTIFICATION   Patient Details  Name: Kirk Robinson MRN: [id number] Date of Birth: 06/21/1956   Medicare Observation Status Notification Given:  Yes    Nellene Banana Leroy Trim, RN 12/12/2023, 10:28 AM

## 2023-12-13 DIAGNOSIS — I471 Supraventricular tachycardia, unspecified: Secondary | ICD-10-CM | POA: Diagnosis not present

## 2023-12-13 LAB — BASIC METABOLIC PANEL WITH GFR
Anion gap: 8 (ref 5–15)
BUN: 40 mg/dL — ABNORMAL HIGH (ref 8–23)
CO2: 31 mmol/L (ref 22–32)
Calcium: 7.7 mg/dL — ABNORMAL LOW (ref 8.9–10.3)
Chloride: 95 mmol/L — ABNORMAL LOW (ref 98–111)
Creatinine, Ser: 1.87 mg/dL — ABNORMAL HIGH (ref 0.61–1.24)
GFR, Estimated: 39 mL/min — ABNORMAL LOW (ref >60)
Glucose, Bld: 132 mg/dL — ABNORMAL HIGH (ref 70–99)
Potassium: 3.7 mmol/L (ref 3.5–5.1)
Sodium: 134 mmol/L — ABNORMAL LOW (ref 135–145)

## 2023-12-13 LAB — CBC
HCT: 29.3 % — ABNORMAL LOW (ref 39.0–52.0)
Hemoglobin: 9.9 g/dL — ABNORMAL LOW (ref 13.0–17.0)
MCH: 31.2 pg (ref 26.0–34.0)
MCHC: 33.8 g/dL (ref 30.0–36.0)
MCV: 92.4 fL (ref 80.0–100.0)
Platelets: 265 10*3/uL (ref 150–400)
RBC: 3.17 MIL/uL — ABNORMAL LOW (ref 4.22–5.81)
RDW: 14.1 % (ref 11.5–15.5)
WBC: 8 10*3/uL (ref 4.0–10.5)
nRBC: 0 % (ref 0.0–0.2)

## 2023-12-13 LAB — GLUCOSE, CAPILLARY
Glucose-Capillary: 109 mg/dL — ABNORMAL HIGH (ref 70–99)
Glucose-Capillary: 238 mg/dL — ABNORMAL HIGH (ref 70–99)
Glucose-Capillary: 154 mg/dL — ABNORMAL HIGH (ref 70–99)
Glucose-Capillary: 188 mg/dL — ABNORMAL HIGH (ref 70–99)

## 2023-12-13 LAB — EXPECTORATED SPUTUM ASSESSMENT W GRAM STAIN, RFLX TO RESP C

## 2023-12-13 MED ORDER — HYDROCOD POLI-CHLORPHE POLI ER 10-8 MG/5ML PO SUER: 5.0000 mL | Freq: Two times a day (BID) | ORAL | Status: DC | PRN

## 2023-12-13 MED ORDER — LEVOTHYROXINE SODIUM 100 MCG PO TABS
100.0000 ug | ORAL_TABLET | Freq: Every day | ORAL | Status: DC
  Administered 2023-12-13 – 2023-12-14 (×2): 100 ug via ORAL
  Filled 2023-12-13 (×2): qty 1

## 2023-12-13 MED ORDER — CHLORHEXIDINE GLUCONATE CLOTH 2 % EX PADS
6.0000 | MEDICATED_PAD | Freq: Every day | CUTANEOUS | Status: DC
  Administered 2023-12-13: 6 via TOPICAL

## 2023-12-13 MED ORDER — PREDNISONE 20 MG PO TABS: 40.0000 mg | ORAL_TABLET | Freq: Every day | ORAL | Status: DC

## 2023-12-13 MED ORDER — GUAIFENESIN ER 600 MG PO TB12
600.0000 mg | ORAL_TABLET | Freq: Two times a day (BID) | ORAL | Status: DC
  Administered 2023-12-13 – 2023-12-14 (×2): 600 mg via ORAL
  Filled 2023-12-13 (×2): qty 1

## 2023-12-13 MED ORDER — FINASTERIDE 5 MG PO TABS
5.0000 mg | ORAL_TABLET | Freq: Every day | ORAL | Status: DC
  Administered 2023-12-13 – 2023-12-14 (×2): 5 mg via ORAL
  Filled 2023-12-13 (×2): qty 1

## 2023-12-13 MED ORDER — IPRATROPIUM BROMIDE 0.02 % IN SOLN
0.5000 mg | Freq: Two times a day (BID) | RESPIRATORY_TRACT | Status: DC
  Administered 2023-12-13: 0.5 mg via RESPIRATORY_TRACT
  Filled 2023-12-13 (×3): qty 2.5

## 2023-12-13 MED ORDER — METHYLPREDNISOLONE SODIUM SUCC 40 MG IJ SOLR
40.0000 mg | Freq: Every day | INTRAMUSCULAR | Status: AC
  Administered 2023-12-14: 40 mg via INTRAVENOUS
  Filled 2023-12-13: qty 1

## 2023-12-13 NOTE — Progress Notes (Signed)
 Physical Therapy Treatment Patient Details Name: Kirk Robinson MRN: [id number] DOB: [birth year] Today's Date: 12/13/2023   History of Present Illness Kirk Robinson is a 67yoM comes to [facility] from pulmonology after noted hypotension and tachycardia. Pt has been sick at home, multiple recent falls, questionable syncope. PMH: COPD, pulmonary fibrosis, 4L O2 use at home, HLD, DM. Pt has had some infrequent falls typically associated with jello legs and buckling. 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 At baseline pt typically modI for ADL performance, limited household AMB on 4L O2, c ad lib device use.    PT Comments  Pt appears more robust this date, his report confirms this. Pt able to demonstrate bed mobility, transfers, and AMB in room without physical assistance, without LOB. AMB limited to ~91ft due to fatigue/exertion, however his HR and SpO2 remain appropriate on 4L/min. Pt partakes in transfers training for leg strengthening at end of session. Utilized elevated surface height to keep level of exertion in desired range. Pt progressing well in general, feels more confidence in return to home. Still needs a transport chair for DC. Did not monitor BP this session, but used symptoms to guide activity.     If plan is discharge home, recommend the following: Assist for transportation;Assistance with cooking/housework;Help with stairs or ramp for entrance;A little help with walking and/or transfers   Can travel by private vehicle        Equipment Recommendations  Other (comment) (transport chair)    Recommendations for Other Services       Precautions / Restrictions Precautions Precautions: Fall Recall of Precautions/Restrictions: Intact Restrictions Weight Bearing Restrictions Per Provider Order: No     Mobility  Bed Mobility Overal bed mobility: Needs Assistance Bed Mobility:  Supine to Sit, Sit to Supine     Supine to sit: Supervision Sit to supine: Supervision        Transfers   Equipment used: None (no walker in room) Transfers: Sit to/from Stand Sit to Stand: Supervision, Contact guard assist                Ambulation/Gait   Gait Distance (Feet): 48 Feet Assistive device: None (be rails) Gait Pattern/deviations: WFL(Within Functional Limits)       General Gait Details: walking around bed and back twice, 4L O2 maintained, bed rail available for support, single UE support used, lightheaded at end, VSS SpO2 91%, HR 92bpm   Stairs             Wheelchair Mobility     Tilt Bed    Modified Rankin (Stroke Patients Only)       Balance                                            Communication    Cognition Arousal: Alert Behavior During Therapy: WFL for tasks assessed/performed   PT - Cognitive impairments: No apparent impairments                                Cueing    Exercises Other Exercises Other Exercises: 5xSTS from elevated surface, no device, sink counter available ad lib    General Comments        Pertinent Vitals/Pain Pain Assessment Pain Assessment:  (  no acute pain at present)    Home Living                          Prior Function            PT Goals (current goals can now be found in the care plan section) Acute Rehab PT Goals Patient Stated Goal: regain strenth, avoid falls, return to home PT Goal Formulation: With patient Time For Goal Achievement: 12/26/23 Potential to Achieve Goals: Good Progress towards PT goals: Progressing toward goals    Frequency    Min 2X/week      PT Plan      Co-evaluation              AM-PAC PT "6 Clicks" Mobility   Outcome Measure  Help needed turning from your back to your side while in a flat bed without using bedrails?: A Little Help needed moving from lying on your back to sitting on the side of a  flat bed without using bedrails?: A Little Help needed moving to and from a bed to a chair (including a wheelchair)?: A Little Help needed standing up from a chair using your arms (e.g., wheelchair or bedside chair)?: A Little Help needed to walk in hospital room?: A Little Help needed climbing 3-5 steps with a railing? : A Little 6 Click Score: 18    End of Session Equipment Utilized During Treatment: Oxygen Activity Tolerance: Patient tolerated treatment well;No increased pain Patient left: in bed;with call bell/phone within reach Nurse Communication: Mobility status PT Visit Diagnosis: Difficulty in walking, not elsewhere classified (R26.2);Other abnormalities of gait and mobility (R26.89);Other symptoms and signs involving the nervous system (R29.898);Muscle weakness (generalized) (M62.81)     Time: 1345-1405 PT Time Calculation (min) (ACUTE ONLY): 20 min  Charges:    $Therapeutic Activity: 8-22 mins PT General Charges $$ ACUTE PT VISIT: 1 Visit                    5:03 PM, 12/13/23 Kirk Robinson, PT, DPT Physical Therapist - [facility]  [phone] (ASCOM)    Kirk Robinson C 12/13/2023, 5:01 PM

## 2023-12-13 NOTE — Plan of Care (Signed)

## 2023-12-13 NOTE — Progress Notes (Signed)
 PROGRESS NOTE  Kirk Robinson  ZOX:[id number] DOB: 10-04-1955 DOA: 12/11/2023 PCP: [name], MD  HPI was taken from Dr. Rosalea Collin:  Kirk Robinson is a 68 y.o. 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   Pt states that he has worsening shortness of breath for more than a week. He had positive test for rhinovirus on 02/06/2024. Pt is taking methylprednisolone  and Levaquin  for possible pneumonia, without improvement. Patient was seen by [name] of pulmonology today, found to have tachycardia with heart rate 189, and hypotension with blood pressure 81/60. Pt is sent to ED for further evaluation and treatment. Pt was found to have SVT with HR up to 173 in ED. her blood pressure is 94/75 in ED.  Pt was given total of 3 dose of 6 mg of adenosine , and 5 mg of IV metoprolol , converted to sinus rhythm.  Currently heart rate is in the 80s.    Per EDP, his wife reported that pt Has had couple falls at home which were witnessed, and 2 episodes of syncope today which lasted for few seconds.  Notably patient had tachycardia and oxygen desaturation on his normal 4L oxygen at home.  Currently patient has dry cough, no chest pain.  Patient states that he had fever few days ago, which has resolved.  Currently no fever or chills.  He has nausea, no vomiting, diarrhea or abdominal pain.  No symptoms of UTI.  Patient was found to have acute urinary retention, and Foley catheter is placed in the ED.  Patient denies drug use or drinking alcohol,   Data reviewed independently and ED Course: pt was found to have WBC 13.2, potassium 3.3, magnesium  1.1, phosphorus 5.9, negative UA, troponin 14 --> 16, lactic acid 4.8, worsening renal function, temperature normal, blood pressure 94/75, 108/71, RR 21  --> 18, oxygen saturation 99% on home level of 4L oxygen currently.  Patient is placed in PCU for observation.     Assessment & Plan:   Principal Problem:   SVT (supraventricular tachycardia) (HCC) Active Problems:   Atrial fibrillation, chronic (HCC)   Syncope   Fall at home, initial encounter   COPD exacerbation (HCC)   Pulmonary fibrosis (HCC)   Rhinovirus infection   Leukocytosis   Acute renal failure superimposed on stage 3a chronic kidney disease (HCC)   Adult hypothyroidism   Type II diabetes mellitus with renal manifestations (HCC)   Stroke (HCC)   Hypokalemia   Hypomagnesemia   Acute urinary retention   Elevated lactic acid level   Chronic pain syndrome   Anxiety and depression  Assessment and Plan:  SVT: s/p adenosine  x 2  & IV lopressor  given in the ER. Continue on increased dose of metoprolol . Pt denies missing a dose of metoprolol    Chronic a. fib: continue on increased dose of metoprolol . Not on chronic anticoagulation as per med rec.    Syncope: Likely due to SVT. CT head negative for acute intracranial abnormalities. Continue on tele      COPD exacerbation: w/ hx of pulmonary fibrosis. Recent rhinovirus infection. Continue bronchodilators & encourage incentive spirometry  S/p Solu-Medrol  IV 80 mg daily x 2 doses followed by 40 mg x 1 dose followed by prednisone  40 mg p.o. daily for 2 days Continue Levaquin , prescribed as an outpatient for total 10 days  total/27 Started Mucinex  600 mg p.o. twice daily, Tussionex as needed  Rhinovirus infection: recent, dx on 4/17. Continue w/ supportive care   AKI on CKDIIIa: baseline Cr 1.4 on 07/13/2023.  It could be secondary to urinary retention and secondary to dehydration & possible ATN.  Cr 3.23---->1.87 Cr is trending down    # Acute urinary retention: foley placed in ER.  Continue on flomax , started Proscar  Recommended to follow-up with urology as an outpatient for further management of BPH Urology follow-up  referral placed May trial voiding trial before discharge versus DC home with Foley catheter and follow-up with urology for voiding trial as an outpatient, we will discuss with the patient.    Hypothyroidism: continue on home dose of levothyroxine    DM2: well controlled, HbA1c 5.8. Continue on SSI w/ accuchecks  Hx of CVA: continue on plavix , statin    HLD: continue on statin    Hypokalemia: WNL today  Hypomagnesemia: resolved      Elevated lactic acid level: continue on IVFs. Repeat lactic acid ordered   Hydrocephalus: has VP shunt, present on admission. Continue w/ supportive care    Chronic pain syndrome: continue on home dose of MS contin     Depression: severity unknown. Continue on home dose of abilify , mirtazapine    Anxiety: severity unknown. Continue on home dose of valium     Fall: at home. PT/OT eval done, recommended home health PT   DVT prophylaxis: heparin   Code Status: full  Family Communication: discussed pt's care w/ pt's family at bedside and answered their questions  Disposition Plan: depends on OT/PT recs   Level of care: Telemetry Cardiac  Status is: Inpatient Remains inpatient appropriate because: severity of illness, requiring IV steroids     Consultants:    Procedures:  Antimicrobials:   Subjective: No significant events overnight, patient was laying comfortably.  Denied any headache or dizziness, no chest pain or repressing, no any active issues. Just feels generalized weakness while working with PT.  Objective: Vitals:   12/13/23 0112 12/13/23 0525 12/13/23 0821 12/13/23 1258  BP: 100/68 106/78 97/70 96/64   Pulse: (!) 58 (!) 58 61 66  Resp: 15 16 16 16   Temp: 97.7 F (36.5 C) 97.6 F (36.4 C) 97.9 F (36.6 C) (!) 97.5 F (36.4 C)  TempSrc:  Oral    SpO2: 100% 100% 100% 99%  Weight:      Height:        Intake/Output Summary (Last 24 hours) at 12/13/2023 1552 Last data filed at 12/13/2023 1040 Gross per 24 hour  Intake  1801.25 ml  Output 1000 ml  Net 801.25 ml   Filed Weights   12/11/23 1832  Weight: 77.1 kg    Examination:  General exam: Appears calm and comfortable  Respiratory system: Equal air entry bilaterally, bilateral crackles, no wheezes.    Cardiovascular system: S1 & S2 +. No rubs, gallops or clicks. Gastrointestinal system: Abdomen is nondistended, soft and nontender. Normal bowel sounds heard. Central nervous system: Alert and awake. Moves all extremities  Psychiatry: Judgement and insight appear normal. Flat mood and affect    Data Reviewed: I have personally reviewed following labs and imaging studies  CBC: Recent Labs  Lab 12/11/23 1507 12/12/23 0425 12/13/23 0458  WBC 13.2* 10.3 8.0  HGB 14.0 11.1* 9.9*  HCT 41.7 33.9* 29.3*  MCV 94.3 97.1 92.4  PLT 381 286 265   Basic Metabolic Panel: Recent Labs  Lab 12/11/23 1507 12/11/23 1755 12/12/23 0425 12/13/23 0458  NA 136  --  135 134*  K 3.3*  --  5.1 3.7  CL 87*  --  93* 95*  CO2 26  --  25 31  GLUCOSE 155*  --  141* 132*  BUN 42*  --  44* 40*  CREATININE 3.23*  --  2.72* 1.87*  CALCIUM  8.7*  --  7.6* 7.7*  MG  --  1.1* 3.2*  --   PHOS  --  5.9*  --   --    GFR: Estimated Creatinine Clearance: 39.4 mL/min (A) (by C-G formula based on SCr of 1.87 mg/dL (H)). Liver Function Tests: No results for input(s): "AST", "ALT", "ALKPHOS", "BILITOT", "PROT", "ALBUMIN" in the last 168 hours. No results for input(s): "LIPASE", "AMYLASE" in the last 168 hours. No results for input(s): "AMMONIA" in the last 168 hours. Coagulation Profile: Recent Labs  Lab 12/11/23 1543  INR 1.1   Cardiac Enzymes: No results for input(s): "CKTOTAL", "CKMB", "CKMBINDEX", "TROPONINI" in the last 168 hours. BNP (last 3 results) No results for input(s): "PROBNP" in the last 8760 hours. HbA1C: No results for input(s): "HGBA1C" in the last 72 hours. CBG: Recent Labs  Lab 12/12/23 1125 12/12/23 1653 12/12/23 2221 12/13/23 0821  12/13/23 1300  GLUCAP 113* 216* 105* 109* 238*   Lipid Profile: No results for input(s): "CHOL", "HDL", "LDLCALC", "TRIG", "CHOLHDL", "LDLDIRECT" in the last 72 hours. Thyroid  Function Tests: Recent Labs    12/11/23 1755  TSH 8.014*   Anemia Panel: No results for input(s): "VITAMINB12", "FOLATE", "FERRITIN", "TIBC", "IRON", "RETICCTPCT" in the last 72 hours. Sepsis Labs: Recent Labs  Lab 12/12/23 0001 12/12/23 0129 12/12/23 0425 12/12/23 1507  LATICACIDVEN 4.6* 4.4* 4.1* 2.5*    Recent Results (from the past 240 hours)  Blood Culture (routine x 2)     Status: None (Preliminary result)   Collection Time: 12/11/23  4:50 PM   Specimen: BLOOD  Result Value Ref Range Status   Specimen Description BLOOD BLOOD RIGHT ARM  Final   Special Requests   Final    BOTTLES DRAWN AEROBIC AND ANAEROBIC Blood Culture adequate volume   Culture   Final    NO GROWTH 2 DAYS Performed at [facility], [address], [place], [place] [address]    Report Status PENDING  Incomplete  Blood Culture (routine x 2)     Status: None (Preliminary result)   Collection Time: 12/11/23  4:55 PM   Specimen: BLOOD  Result Value Ref Range Status   Specimen Description BLOOD BLOOD RIGHT HAND  Final   Special Requests   Final    BOTTLES DRAWN AEROBIC AND ANAEROBIC Blood Culture results may not be optimal due to an inadequate volume of blood received in culture bottles   Culture   Final    NO GROWTH 2 DAYS Performed at [facility], [address] [place], [place] [address]    Report Status PENDING  Incomplete         Radiology Studies: CT CHEST ABDOMEN PELVIS WO CONTRAST Result Date: 12/11/2023 CLINICAL DATA:  Cough, intermittent hypoxia, concern for pneumonia. History of COPD, ILD. Significant AKI, noncontrast study please EXAM: CT CHEST, ABDOMEN AND PELVIS WITHOUT CONTRAST TECHNIQUE: Multidetector CT imaging of the chest, abdomen and pelvis was performed following the standard  protocol without IV contrast. RADIATION DOSE REDUCTION: This exam was performed according to the departmental dose-optimization program which includes automated exposure control, adjustment of the mA and/or kV according to patient size and/or use of iterative reconstruction technique. COMPARISON:  None Available.  FINDINGS: CHEST: Cardiovascular: The thoracic aorta is normal in caliber. The heart is normal in size. No significant pericardial effusion. Four-vessel coronary calcification. Moderate atherosclerotic plaque of the thoracic aorta. Lungs/Pleura: Emphysematous changes. Redemonstration of peripheral reticulations with cystic changes more prominent along the lower lobes. No focal consolidation. No pulmonary nodule. No pulmonary mass. No pulmonary contusion or laceration. No pneumatocele formation. No pleural effusion. No pneumothorax. No hemothorax. Mediastinum/Nodes: No pneumomediastinum. The central airways are patent. The esophagus is unremarkable. The thyroid  is unremarkable. Limited evaluation for hilar lymphadenopathy on this noncontrast study. No mediastinal or axillary lymphadenopathy. Musculoskeletal/Chest wall No chest wall mass. No acute rib or sternal fracture. No spinal fracture. ABDOMEN / PELVIS: Hepatobiliary: Not enlarged. No focal lesion. The gallbladder is otherwise unremarkable with no radio-opaque gallstones. No biliary ductal dilatation. Pancreas: Diffusely atrophic parenchyma. Normal pancreatic contour. No main pancreatic duct dilatation. Spleen: Not enlarged. No focal lesion. Adrenals/Urinary Tract: No nodularity bilaterally. No hydroureteronephrosis. No nephroureterolithiasis. No contour deforming renal mass. The urinary bladder is unremarkable. Stomach/Bowel: No small or large bowel wall thickening or dilatation. The appendix is unremarkable. Vasculature/Lymphatic: Severe atherosclerotic plaque. Left common/external iliac stent-not well visualized and not evaluated on this noncontrast  study. No abdominal aorta or iliac aneurysm. No abdominal, pelvic, inguinal lymphadenopathy. Reproductive: Prostate is unremarkable. Other: No simple free fluid ascites. No pneumoperitoneum. No mesenteric hematoma identified. No organized fluid collection. Musculoskeletal: No significant soft tissue hematoma. No acute pelvic fracture. No spinal fracture. Bilateral L5 pars interarticularis defects with grade 1 anterolisthesis of L5 on S1. Other ports and devices: Left chest wall and abdominal wall soft tissue ventriculoperitoneal shunt with tip terminating in the left lower abdominal quadrant. No kinking or discontinuity of the shunt identified. IMPRESSION: 1. No acute intrathoracic, intra-abdominal, intrapelvic traumatic injury with limited evaluation on this noncontrast study. 2. No acute fracture or traumatic malalignment of the thoracic or lumbar spine. 3. Emphysema (ICD10-J43.9) and interstitial lung disease. 4.  Aortic Atherosclerosis (ICD10-I70.0). Electronically Signed   By: Morgane  Naveau M.D.   On: 12/11/2023 20:02   CT HEAD WO CONTRAST ( ) Result Date: 12/11/2023 CLINICAL DATA:  Trauma Cough, intermittent hypoxia, concern for pneumonia. History of COPD, ILD. Significant AKI, noncontrast study please EXAM: CT HEAD WITHOUT CONTRAST CT CERVICAL SPINE WITHOUT CONTRAST TECHNIQUE: Multidetector CT imaging of the head and cervical spine was performed following the standard protocol without intravenous contrast. Multiplanar CT image reconstructions of the cervical spine were also generated. RADIATION DOSE REDUCTION: This exam was performed according to the departmental dose-optimization program which includes automated exposure control, adjustment of the mA and/or kV according to patient size and/or use of iterative reconstruction technique. COMPARISON:  CT head and C-spine 05/11/2019 FINDINGS: CT HEAD FINDINGS Brain: Left frontal approach ventriculoperitoneal shunt with tip terminating slightly right of  midline in the region of the foramen Monro. Patchy and confluent areas of decreased attenuation are noted throughout the deep and periventricular white matter of the cerebral hemispheres bilaterally, compatible with chronic microvascular ischemic disease. No evidence of large-territorial acute infarction. No parenchymal hemorrhage. No mass lesion. No extra-axial collection. No mass effect or midline shift. No hydrocephalus. Stable Slit-like ventricles. Basilar cisterns are patent. Vascular: No hyperdense vessel. Skull: No acute fracture or focal lesion. Sinuses/Orbits: Paranasal sinuses and mastoid air cells are clear. The orbits are unremarkable. Other: None. CT CERVICAL SPINE FINDINGS Alignment: Grade 1 anterolisthesis of C3 on C4 and C4 on C5. Skull base and vertebrae: Multilevel moderate degenerative changes of the spine. Severe degenerative changes at C1-C2 level  with question of old fracture-stable. No severe osseous neural foraminal or central canal stenosis. No acute fracture. No aggressive appearing focal osseous lesion or focal pathologic process. Soft tissues and spinal canal: No prevertebral fluid or swelling. No visible canal hematoma. Upper chest: Emphysematous changes. Otherwise please see separately dictated CT chest 12/11/2023. Other: None. IMPRESSION: 1. No acute traumatic intracranial abnormality. 2. Stable Slit-like ventricles with ventriculoperitoneal shunt in grossly appropriate position. 3. No acute displaced fracture or traumatic listhesis of the cervical spine. 4. Emphysema (ICD10-J43.9). Otherwise please see separately dictated CT chest 12/11/2023. Electronically Signed   By: Morgane  Naveau M.D.   On: 12/11/2023 19:50   CT Cervical Spine Wo Contrast Result Date: 12/11/2023 CLINICAL DATA:  Trauma Cough, intermittent hypoxia, concern for pneumonia. History of COPD, ILD. Significant AKI, noncontrast study please EXAM: CT HEAD WITHOUT CONTRAST CT CERVICAL SPINE WITHOUT CONTRAST TECHNIQUE:  Multidetector CT imaging of the head and cervical spine was performed following the standard protocol without intravenous contrast. Multiplanar CT image reconstructions of the cervical spine were also generated. RADIATION DOSE REDUCTION: This exam was performed according to the departmental dose-optimization program which includes automated exposure control, adjustment of the mA and/or kV according to patient size and/or use of iterative reconstruction technique. COMPARISON:  CT head and C-spine 05/11/2019 FINDINGS: CT HEAD FINDINGS Brain: Left frontal approach ventriculoperitoneal shunt with tip terminating slightly right of midline in the region of the foramen Monro. Patchy and confluent areas of decreased attenuation are noted throughout the deep and periventricular white matter of the cerebral hemispheres bilaterally, compatible with chronic microvascular ischemic disease. No evidence of large-territorial acute infarction. No parenchymal hemorrhage. No mass lesion. No extra-axial collection. No mass effect or midline shift. No hydrocephalus. Stable Slit-like ventricles. Basilar cisterns are patent. Vascular: No hyperdense vessel. Skull: No acute fracture or focal lesion. Sinuses/Orbits: Paranasal sinuses and mastoid air cells are clear. The orbits are unremarkable. Other: None. CT CERVICAL SPINE FINDINGS Alignment: Grade 1 anterolisthesis of C3 on C4 and C4 on C5. Skull base and vertebrae: Multilevel moderate degenerative changes of the spine. Severe degenerative changes at C1-C2 level with question of old fracture-stable. No severe osseous neural foraminal or central canal stenosis. No acute fracture. No aggressive appearing focal osseous lesion or focal pathologic process. Soft tissues and spinal canal: No prevertebral fluid or swelling. No visible canal hematoma. Upper chest: Emphysematous changes. Otherwise please see separately dictated CT chest 12/11/2023. Other: None. IMPRESSION: 1. No acute traumatic  intracranial abnormality. 2. Stable Slit-like ventricles with ventriculoperitoneal shunt in grossly appropriate position. 3. No acute displaced fracture or traumatic listhesis of the cervical spine. 4. Emphysema (ICD10-J43.9). Otherwise please see separately dictated CT chest 12/11/2023. Electronically Signed   By: Morgane  Naveau M.D.   On: 12/11/2023 19:50   DG Chest Port 1 View Result Date: 12/11/2023 CLINICAL DATA:  Shortness of breath.  Tachycardia. EXAM: PORTABLE CHEST 1 VIEW COMPARISON:  X-ray 05/10/2019. chest CT 03/23/2022. FINDINGS: Overlapping cardiac leads and defibrillator pads. Presumed VP shunt catheter tubing running vertically along the left hemithorax. Enlarged cardiopericardial silhouette. No pneumothorax or effusion. Interstitial changes again seen of the lungs, likely chronic. Films are under penetrated. Apical pleural thickening. IMPRESSION: Diffuse interstitial lung changes, likely chronic. Enlarged cardiac silhouette. Presumed VP shunt catheter tubing running vertically along the left hemithorax. Electronically Signed   By: Adrianna Horde M.D.   On: 12/11/2023 18:56        Scheduled Meds:  ARIPiprazole   5 mg Oral QPM   aspirin  EC  81  mg Oral Daily   atorvastatin   20 mg Oral q1800   budeson-glycopyrrolate -formoterol   2 puff Inhalation BID   Chlorhexidine  Gluconate Cloth  6 each Topical Daily   clopidogrel   75 mg Oral Daily   diazepam   5 mg Oral Q12H   docusate sodium   100 mg Oral TID   fentaNYL  (SUBLIMAZE ) injection  50 mcg Intravenous Once   heparin  injection (subcutaneous)  5,000 Units Subcutaneous Q8H   insulin  aspart  0-5 Units Subcutaneous QHS   insulin  aspart  0-9 Units Subcutaneous TID WC   ipratropium  0.5 mg Nebulization BID   levofloxacin   250 mg Oral Daily   levothyroxine   100 mcg Oral Q0600   methylPREDNISolone  (SOLU-MEDROL ) injection  80 mg Intravenous Daily   metoprolol  succinate  25 mg Oral Daily   mirtazapine   45 mg Oral QHS   morphine   30 mg Oral  Q12H   pantoprazole   40 mg Oral BID AC   rOPINIRole   0.5 mg Oral QHS   tamsulosin   0.4 mg Oral Daily   venlafaxine  XR  150 mg Oral QPM   Continuous Infusions:     LOS: 1 day    Total time spent: 55 minutes   Althia Atlas, MD [facility] Pager [phone]  If 7PM-7AM, please contact night-coverage [email or web address] 12/13/2023, 3:52 PM

## 2023-12-14 ENCOUNTER — Inpatient Hospital Stay (HOSPITAL_COMMUNITY)
Admit: 2023-12-14 | Discharge: 2023-12-14 | Disposition: A | Discharge status: Home / Self Care | Attending: Student | Admitting: Student

## 2023-12-14 DIAGNOSIS — I5021 Acute systolic (congestive) heart failure: Secondary | ICD-10-CM

## 2023-12-14 DIAGNOSIS — I471 Supraventricular tachycardia, unspecified: Secondary | ICD-10-CM | POA: Diagnosis not present

## 2023-12-14 LAB — ECHOCARDIOGRAM COMPLETE
AR max vel: 3.42 cm2
AV Area VTI: 3.6 cm2
AV Area mean vel: 3.18 cm2
AV Mean grad: 2 mmHg
AV Peak grad: 3 mmHg
Ao pk vel: 0.87 m/s
Area-P 1/2: 3 cm2
Height: 69 in
MV VTI: 2.88 cm2
S' Lateral: 3 cm
Weight: 2719.59 [oz_av]

## 2023-12-14 LAB — BASIC METABOLIC PANEL WITH GFR
Anion gap: 11 (ref 5–15)
BUN: 30 mg/dL — ABNORMAL HIGH (ref 8–23)
CO2: 34 mmol/L — ABNORMAL HIGH (ref 22–32)
Calcium: 8.5 mg/dL — ABNORMAL LOW (ref 8.9–10.3)
Chloride: 95 mmol/L — ABNORMAL LOW (ref 98–111)
Creatinine, Ser: 1.41 mg/dL — ABNORMAL HIGH (ref 0.61–1.24)
GFR, Estimated: 55 mL/min — ABNORMAL LOW (ref >60)
Glucose, Bld: 110 mg/dL — ABNORMAL HIGH (ref 70–99)
Potassium: 5 mmol/L (ref 3.5–5.1)
Sodium: 140 mmol/L (ref 135–145)

## 2023-12-14 LAB — CBC
HCT: 30.4 % — ABNORMAL LOW (ref 39.0–52.0)
Hemoglobin: 10.3 g/dL — ABNORMAL LOW (ref 13.0–17.0)
MCH: 32 pg (ref 26.0–34.0)
MCHC: 33.9 g/dL (ref 30.0–36.0)
MCV: 94.4 fL (ref 80.0–100.0)
Platelets: 244 10*3/uL (ref 150–400)
RBC: 3.22 MIL/uL — ABNORMAL LOW (ref 4.22–5.81)
RDW: 14.1 % (ref 11.5–15.5)
WBC: 6.9 10*3/uL (ref 4.0–10.5)
nRBC: 0 % (ref 0.0–0.2)

## 2023-12-14 LAB — GLUCOSE, CAPILLARY
Glucose-Capillary: 130 mg/dL — ABNORMAL HIGH (ref 70–99)
Glucose-Capillary: 153 mg/dL — ABNORMAL HIGH (ref 70–99)

## 2023-12-14 LAB — PHOSPHORUS: Phosphorus: 3.9 mg/dL (ref 2.5–4.6)

## 2023-12-14 LAB — MAGNESIUM: Magnesium: 2 mg/dL (ref 1.7–2.4)

## 2023-12-14 MED ORDER — FINASTERIDE 5 MG PO TABS: 5.0000 mg | ORAL_TABLET | Freq: Every day | ORAL | 11 refills | Status: DC

## 2023-12-14 MED ORDER — LEVOFLOXACIN 500 MG PO TABS
500.0000 mg | ORAL_TABLET | Freq: Every day | ORAL | Status: DC
  Administered 2023-12-14: 500 mg via ORAL
  Filled 2023-12-14: qty 1

## 2023-12-14 MED ORDER — ENOXAPARIN SODIUM 40 MG/0.4ML IJ SOSY: 40.0000 mg | PREFILLED_SYRINGE | INTRAMUSCULAR | Status: DC

## 2023-12-14 NOTE — Progress Notes (Signed)
 Mobility Specialist - Progress Note   Pre-mobility: HR, BP, SpO2 96% During mobility: HR 106, BP, SpO2 89% Post-mobility: HR 92, BP, SPO2 91%   12/14/23 1503  Mobility  Activity Ambulated with assistance in hallway  Level of Assistance Standby assist, set-up cues, supervision of patient - no hands on  Assistive Device Front wheel walker  Distance Ambulated (ft) 80 ft  Activity Response Tolerated well  Mobility visit 1 Mobility  Mobility Specialist Start Time (ACUTE ONLY) 1433  Mobility Specialist Stop Time (ACUTE ONLY) 1452  Mobility Specialist Time Calculation (min) (ACUTE ONLY) 19 min   Pt supine upon entry, utilizing RA. Pt agreeable to OOB amb this date, 02 96% at rest. Pt STS to RW and amb w/ MinG-close supervision ~40 ft down the hallway before opting to return to the room d/t fatigue. Pt destat to 89% on 4L ~40 ft into amb, however quickly recovering to 90% after >1 min PBL-- denied SOB. Pt returned to the room, left supine with alarm set and needs within reach.   Versa Gore Mobility Specialist 12/14/23 5:33 PM

## 2023-12-14 NOTE — Plan of Care (Signed)

## 2023-12-14 NOTE — Discharge Summary (Signed)
 [facility] Discharge Summary   Patient: Kirk Robinson EAV:[id number]  PCP: [name], MD  Date of admission: 12/11/2023   Date of discharge:  12/14/2023     Discharge Diagnoses:  Principal Problem:   SVT (supraventricular tachycardia) (HCC) Active Problems:   Atrial fibrillation, chronic (HCC)   Syncope   Fall at home, initial encounter   COPD exacerbation (HCC)   Pulmonary fibrosis (HCC)   Rhinovirus infection   Leukocytosis   Acute renal failure superimposed on stage 3a chronic kidney disease (HCC)   Adult hypothyroidism   Type II diabetes mellitus with renal manifestations (HCC)   Stroke (HCC)   Hypokalemia   Hypomagnesemia   Acute urinary retention   Elevated lactic acid level   Chronic pain syndrome   Anxiety and depression   Admitted From: Home Disposition:  Home with home med services  Recommendations for Outpatient Follow-up:  Follow-up with PCP in 1 week, continue monitor BP and heart rate at home and follow with PCP and cardiology to titrate medication accordingly.  Check CBC and BMP after 1 week CT scan for lung cancer screening at an outpatient Follow-up with cardiology in 1 week. Follow-up with urology in 1 week for voiding trial as an outpatient. Follow up LABS/TEST:     Follow-up Information     [name], MD Follow up in 1 week(s).   Specialty: Family Medicine Contact information: [address] [place] [address] [phone]         Antonette Batters, MD Follow up in 1 week(s).   Specialties: Cardiology, Internal Medicine Contact information: [address] [place] [address] [phone]         Lawerence Pressman, MD Follow up in 1 week(s).   Specialty: Urology Contact information: [address] [place] [address] [phone]                Diet recommendation: Cardiac and Carb modified diet  Activity: The patient is advised to gradually reintroduce usual activities, as  tolerated  Discharge Condition: stable  Code Status: Full code   History of present illness: As per the H and P dictated on admission Hospital Course:  HPI was taken from Dr. Rosalea Collin:  Kirk Robinson is a 68 y.o. 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   Pt states that he has worsening shortness of breath for more than a week. He had positive test for rhinovirus on 02/06/2024. Pt is taking methylprednisolone  and Levaquin  for possible pneumonia, without improvement. Patient was seen by [name] of pulmonology today, found to have tachycardia with heart rate 189, and hypotension with blood pressure 81/60. Pt is sent to ED for further evaluation and treatment. Pt was found to have SVT with HR up to 173 in ED. her blood pressure is 94/75 in ED.  Pt was given total of 3 dose of 6 mg of adenosine , and 5 mg of IV metoprolol , converted to sinus rhythm.  Currently heart rate is in the 80s.    Per EDP, his wife reported that pt Has had couple falls at home which were witnessed, and 2 episodes of syncope today which lasted for few seconds.  Notably patient had tachycardia and oxygen desaturation on his normal 4L oxygen at home.  Currently patient has dry cough,  no chest pain.  Patient states that he had fever few days ago, which has resolved.  Currently no fever or chills.  He has nausea, no vomiting, diarrhea or abdominal pain.  No symptoms of UTI.  Patient was found to have acute urinary retention, and Foley catheter is placed in the ED.  Patient denies drug use or drinking alcohol,   Data reviewed independently and ED Course: pt was found to have WBC 13.2, potassium 3.3, magnesium  1.1, phosphorus 5.9, negative UA, troponin 14 --> 16, lactic acid 4.8, worsening renal function,  temperature normal, blood pressure 94/75, 108/71, RR 21 --> 18, oxygen saturation 99% on home level of 4L oxygen currently.  Patient is placed in PCU for observation.       Assessment & Plan:   # SVT: s/p adenosine  x 2  & IV lopressor  given in the ER. Continue on increased dose of metoprolol . Pt denies missing a dose of metoprolol   TTE LVEF 55%, no WMA, grade 1 diastolic dysfunction.  No significant findings. Resumed Toprol -XL 12.5 mg p.o. daily.  Patient's BP remains soft.  Recommend to follow-up with cardiology in 1 week as an outpatient.  # Chronic a. fib: Continue Toprol -XL 12.5 mg p.o. daily. Not on chronic anticoagulation as per med rec.    # Syncope: Likely due to SVT. CT head negative for acute intracranial abnormalities.     # COPD exacerbation: w/ hx of pulmonary fibrosis. Recent rhinovirus infection. Continue bronchodilators & encourage incentive spirometry  S/p Solu-Medrol  IV 80 mg daily x 2 doses followed by 40 mg x 1 dose followed by prednisone  40 mg p.o. daily for 2 days Continue Levaquin , prescribed as an outpatient for total 10 days total/27 Started Mucinex  600 mg p.o. twice daily, Tussionex as needed   # Rhinovirus infection: recent, dx on 4/17. Continue w/ supportive care   # AKI on CKDIIIa: baseline Cr 1.4 on 07/13/2023.  It could be secondary to urinary retention and secondary to dehydration & possible ATN.  Cr 3.23---->1.41, Cr is trending down    # Acute urinary retention: foley placed in ER. Continue on flomax , started Proscar  Recommended to follow-up with urology as an outpatient for further management of BPH. Urology follow-up referral placed follow-up with urology for voiding trial as an outpatient, discuss with the patient.  # Hypothyroidism: continue on home dose of levothyroxine   # DM2: well controlled, HbA1c 5.8. Continue on SSI w/ accuchecks # Hx of CVA: continue on plavix , statin  # HLD: continue on statin  # Hypokalemia: WNL today # Hypomagnesemia:  resolved  # Elevated lactic acid level: s/p IVFs. Repeat lactic acid trended down # Hydrocephalus: has VP shunt, present on admission. Continue w/ supportive care  # Chronic pain syndrome: continue on home dose of MS contin   # Depression: severity unknown. Continue on home dose of abilify , mirtazapine   # Anxiety: severity unknown. Continue on home dose of valium      Body mass index is 25.1 kg/m.  Nutrition Interventions:  - Patient was instructed, not to drive, operate heavy machinery, perform activities at heights, swimming or participation in water activities or provide baby sitting services while on Pain, Sleep and Anxiety Medications; until his outpatient Physician has advised to do so again.  - Also recommended to not to take more than prescribed Pain, Sleep and Anxiety Medications.  Patient was seen by physical therapy, who recommended Home health, which was arranged. On the day of the discharge the patient's vitals were stable, and no other acute  medical condition were reported by patient. the patient was felt safe to be discharge at Home with Home health.  Consultants: None Procedures: None  Discharge Exam: General: Appear in no distress, no Rash; Oral Mucosa Clear, moist. Cardiovascular: S1 and S2 Present, no Murmur, Respiratory: normal respiratory effort, Bilateral Air entry present and no Crackles, no wheezes Abdomen: Bowel Sound present, Soft and no tenderness, no hernia Extremities: no Pedal edema, no calf tenderness Neurology: alert and oriented to time, place, and person affect appropriate.  Filed Weights   12/11/23 1832 12/14/23 0500  Weight: 77.1 kg 77.1 kg   Vitals:   12/14/23 0001 12/14/23 0922  BP: 100/74 107/72  Pulse: 61 82  Resp: 18 18  Temp: (!) 97.4 F (36.3 C) 98.2 F (36.8 C)  SpO2: 99% 100%    DISCHARGE MEDICATION: Allergies as of 12/14/2023       Reactions   Advair Diskus [fluticasone-salmeterol] Shortness Of Breath   Erythromycin Nausea  And Vomiting   Iodinated Contrast Media Shortness Of Breath, Other (See Comments)   Ativan  [lorazepam ] Other (See Comments)   Caused pt's heart to stop   Cephalexin Other (See Comments)   Heart Burn   Darvocet [propoxyphene N-acetaminophen ] Nausea And Vomiting   Doxycycline Hyclate Nausea And Vomiting, Swelling, Other (See Comments)   Tongue swelling, severe depression   Propoxyphene Nausea And Vomiting   Septra [sulfamethoxazole-trimethoprim] Rash        Medication List     STOP taking these medications    chlorpheniramine-HYDROcodone 10-8 MG/5ML Commonly known as: TUSSIONEX   methylPREDNISolone  4 MG Tbpk tablet Commonly known as: MEDROL  DOSEPAK       TAKE these medications    Accu-Chek FastClix Lancets Misc Apply topically daily.   Accu-Chek Guide w/Device Kit See admin instructions.   albuterol  108 (90 Base) MCG/ACT inhaler Commonly known as: VENTOLIN  HFA Inhale 2 puffs into the lungs every 6 (six) hours as needed for wheezing or shortness of breath.   albuterol  (2.5 MG/3ML) 0.083% nebulizer solution Commonly known as: PROVENTIL  Inhale 2.5 mg into the lungs every 6 (six) hours as needed for wheezing.   ARIPiprazole  5 MG tablet Commonly known as: ABILIFY  Take 5 mg by mouth daily.   Aspirin  Low Dose 81 MG tablet Generic drug: aspirin  EC TAKE 1 TABLET BY MOUTH EVERY DAY   atorvastatin  20 MG tablet Commonly known as: LIPITOR Take 1 tablet (20 mg total) by mouth daily at 6 PM.   Breztri  Aerosphere 160-9-4.8 MCG/ACT Aero inhaler Generic drug: budeson-glycopyrrolate -formoterol  SMARTSIG:2 Inhalation Via Inhaler Twice Daily   clopidogrel  75 MG tablet Commonly known as: PLAVIX  TAKE 1 TABLET BY MOUTH EVERY DAY   diazepam  5 MG tablet Commonly known as: VALIUM  Take 5 mg by mouth every 12 (twelve) hours.   Docusate Sodium  100 MG capsule Take 100 mg by mouth in the morning, at noon, and at bedtime.   Ensure Max Protein Liqd Take 330 mLs (11 oz total) by  mouth 2 (two) times daily.   finasteride  5 MG tablet Commonly known as: PROSCAR  Take 1 tablet (5 mg total) by mouth daily. Start taking on: December 15, 2023   glucose blood test strip See admin instructions.   ibandronate 150 MG tablet Commonly known as: BONIVA Take 150 mg by mouth every 30 (thirty) days.   ipratropium 0.03 % nasal spray Commonly known as: ATROVENT  Place 1 spray into the nose 2 (two) times daily as needed for rhinitis.   ipratropium-albuterol  0.5-2.5 (3) MG/3ML Soln Commonly known as:  DUONEB Inhale 3 mLs into the lungs 4 (four) times daily.   levofloxacin  500 MG tablet Commonly known as: LEVAQUIN  Take 500 mg by mouth daily.   levothyroxine  100 MCG tablet Commonly known as: SYNTHROID  Take 100 mcg by mouth daily before breakfast.   metFORMIN  1000 MG tablet Commonly known as: GLUCOPHAGE  TAKE 1 TABLET BY MOUTH 2 TIMES DAILY WITH MEAL. What changed: See the new instructions.   metoprolol  succinate 25 MG 24 hr tablet Commonly known as: TOPROL -XL Take 12.5 mg by mouth daily.   mirtazapine  45 MG tablet Commonly known as: REMERON  Take 45 mg by mouth at bedtime.   morphine  30 MG 12 hr tablet Commonly known as: MS CONTIN  Take 30 mg by mouth every 12 (twelve) hours.   naloxone  4 MG/0.1ML Liqd nasal spray kit Commonly known as: NARCAN  Place into the nose.   pantoprazole  40 MG tablet Commonly known as: PROTONIX  Take 40 mg by mouth 2 (two) times daily before a meal.   Pirfenidone 267 MG Tabs Take 801 mg by mouth 3 (three) times daily.   predniSONE  10 MG tablet Commonly known as: DELTASONE  Take 10 mg by mouth daily.   rOPINIRole  0.5 MG tablet Commonly known as: REQUIP  Take 0.5 mg by mouth at bedtime.   tamsulosin  0.4 MG Caps capsule Commonly known as: FLOMAX  Take 0.4 mg by mouth daily.   torsemide 20 MG tablet Commonly known as: DEMADEX Take 1 tablet (20 mg total) by mouth daily. Skip the dose if systolic BP less than 120 mmHg What changed:  additional instructions   venlafaxine  XR 150 MG 24 hr capsule Commonly known as: EFFEXOR -XR Take 1 capsule (150 mg total) by mouth daily with breakfast.   Vitamin D  (Ergocalciferol ) 1.25 MG (50000 UNIT) Caps capsule Commonly known as: DRISDOL  Take 50,000 Units by mouth every 7 (seven) days.               Durable Medical Equipment  (From admission, onward)           Start     Ordered   12/13/23 1442  For home use only DME Other see comment  Once       Comments: Transport chair  Question:  Length of Need  Answer:  6 Months   12/13/23 1441           Allergies  Allergen Reactions   Advair Diskus [Fluticasone-Salmeterol] Shortness Of Breath   Erythromycin Nausea And Vomiting   Iodinated Contrast Media Shortness Of Breath and Other (See Comments)   Ativan  [Lorazepam ] Other (See Comments)    Caused pt's heart to stop   Cephalexin Other (See Comments)    Heart Burn   Darvocet [Propoxyphene N-Acetaminophen ] Nausea And Vomiting   Doxycycline Hyclate Nausea And Vomiting, Swelling and Other (See Comments)    Tongue swelling, severe depression   Propoxyphene Nausea And Vomiting   Septra [Sulfamethoxazole-Trimethoprim] Rash   Discharge Instructions     Ambulatory referral to Urology   Complete by: As directed    Call MD for:  difficulty breathing, headache or visual disturbances   Complete by: As directed    Call MD for:  extreme fatigue   Complete by: As directed    Call MD for:  persistant dizziness or light-headedness   Complete by: As directed    Call MD for:  persistant nausea and vomiting   Complete by: As directed    Call MD for:  severe uncontrolled pain   Complete by: As directed  Call MD for:  temperature >100.4   Complete by: As directed    Diet - low sodium heart healthy   Complete by: As directed    Diet Carb Modified   Complete by: As directed    Discharge instructions   Complete by: As directed    Follow-up with PCP in 1 week, continue  monitor BP and heart rate at home and follow with PCP and cardiology to titrate medication accordingly.  Check CBC and BMP after 1 week CT scan for lung cancer screening at an outpatient Follow-up with cardiology in 1 week. Follow-up with urology in 1 week for voiding trial as an outpatient.   Increase activity slowly   Complete by: As directed        The results of significant diagnostics from this hospitalization (including imaging, microbiology, ancillary and laboratory) are listed below for reference.    Significant Diagnostic Studies: ECHOCARDIOGRAM COMPLETE Result Date: 12/14/2023    ECHOCARDIOGRAM REPORT   Patient Name:   Kirk Robinson Date of Exam: 12/14/2023 Medical Rec #:  [id number]       Height:       69.0 in Accession #:    [id number]      Weight:       170.0 lb Date of Birth:  06-26-1956       BSA:          1.928 m Patient Age:    67 years        BP:           107/72 mmHg Patient Gender: M               HR:           82 bpm. Exam Location:  [facility] Procedure: 2D Echo, Cardiac Doppler and Color Doppler (Both Spectral and Color            Flow Doppler were utilized during procedure). STAT ECHO Indications:     CHF-acute systolic I50.21  History:         Patient has prior history of Echocardiogram examinations, most                  recent 04/20/2020.  Sonographer:     Broadus Canes Referring Phys:  [id number] Althia Atlas Diagnosing Phys: Belva Boyden MD  Sonographer Comments: Image acquisition challenging due to COPD. IMPRESSIONS  1. Left ventricular ejection fraction, by estimation, is 50 to 55%. The left ventricle has low normal function. The left ventricle has no regional wall motion abnormalities. Left ventricular diastolic parameters are consistent with Grade I diastolic dysfunction (impaired relaxation).  2. Right ventricular systolic function is low normal. The right ventricular size is normal. There is normal pulmonary artery systolic pressure. The estimated right ventricular systolic  pressure is 22.5 mmHg.  3. The mitral valve is normal in structure. No evidence of mitral valve regurgitation. No evidence of mitral stenosis.  4. The aortic valve is normal in structure. Aortic valve regurgitation is not visualized. No aortic stenosis is present.  5. The inferior vena cava is normal in size with greater than 50% respiratory variability, suggesting right atrial pressure of 3 mmHg. FINDINGS  Left Ventricle: Left ventricular ejection fraction, by estimation, is 50 to 55%. The left ventricle has low normal function. The left ventricle has no regional wall motion abnormalities. Strain was performed and the global longitudinal strain is indeterminate. The left ventricular internal cavity size was normal in size. There is no left ventricular hypertrophy. Left  ventricular diastolic parameters are consistent with Grade I diastolic dysfunction (impaired relaxation). Right Ventricle: The right ventricular size is normal. No increase in right ventricular wall thickness. Right ventricular systolic function is low normal. There is normal pulmonary artery systolic pressure. The tricuspid regurgitant velocity is 2.09 m/s,  and with an assumed right atrial pressure of 5 mmHg, the estimated right ventricular systolic pressure is 22.5 mmHg. Left Atrium: Left atrial size was normal in size. Right Atrium: Right atrial size was normal in size. Pericardium: There is no evidence of pericardial effusion. Mitral Valve: The mitral valve is normal in structure. No evidence of mitral valve regurgitation. No evidence of mitral valve stenosis. MV peak gradient, 2.5 mmHg. The mean mitral valve gradient is 1.0 mmHg. Tricuspid Valve: The tricuspid valve is normal in structure. Tricuspid valve regurgitation is not demonstrated. No evidence of tricuspid stenosis. Aortic Valve: The aortic valve is normal in structure. Aortic valve regurgitation is not visualized. No aortic stenosis is present. Aortic valve mean gradient measures 2.0  mmHg. Aortic valve peak gradient measures 3.0 mmHg. Aortic valve area, by VTI measures 3.60 cm. Pulmonic Valve: The pulmonic valve was normal in structure. Pulmonic valve regurgitation is not visualized. No evidence of pulmonic stenosis. Aorta: The aortic root is normal in size and structure. Venous: The inferior vena cava is normal in size with greater than 50% respiratory variability, suggesting right atrial pressure of 3 mmHg. IAS/Shunts: No atrial level shunt detected by color flow Doppler. Additional Comments: 3D was performed not requiring image post processing on an independent workstation and was indeterminate.  LEFT VENTRICLE PLAX 2D LVIDd:         4.30 cm   Diastology LVIDs:         3.00 cm   LV e' medial:    8.59 cm/s LV PW:         1.00 cm   LV E/e' medial:  6.6 LV IVS:        0.90 cm   LV e' lateral:   8.59 cm/s LVOT diam:     2.00 cm   LV E/e' lateral: 6.6 LV SV:         56 LV SV Index:   29 LVOT Area:     3.14 cm  RIGHT VENTRICLE RV Basal diam:  2.90 cm RV Mid diam:    2.50 cm LEFT ATRIUM           Index       RIGHT ATRIUM           Index LA diam:      2.90 cm 1.50 cm/m  RA Area:     12.00 cm LA Vol (A2C): 9.8 ml  5.09 ml/m  RA Volume:   22.20 ml  11.51 ml/m LA Vol (A4C): 15.7 ml 8.14 ml/m  AORTIC VALVE AV Area (Vmax):    3.42 cm AV Area (Vmean):   3.18 cm AV Area (VTI):     3.60 cm AV Vmax:           86.50 cm/s AV Vmean:          59.800 cm/s AV VTI:            0.156 m AV Peak Grad:      3.0 mmHg AV Mean Grad:      2.0 mmHg LVOT Vmax:         94.20 cm/s LVOT Vmean:        60.600 cm/s LVOT VTI:  0.179 m LVOT/AV VTI ratio: 1.15  AORTA Ao Root diam: 3.40 cm MITRAL VALVE               TRICUSPID VALVE MV Area (PHT): 3.00 cm    TR Peak grad:   17.5 mmHg MV Area VTI:   2.88 cm    TR Vmax:        209.00 cm/s MV Peak grad:  2.5 mmHg MV Mean grad:  1.0 mmHg    SHUNTS MV Vmax:       0.80 m/s    Systemic VTI:  0.18 m MV Vmean:      46.8 cm/s   Systemic Diam: 2.00 cm MV Decel Time: 253 msec MV  E velocity: 57.10 cm/s MV A velocity: 73.40 cm/s MV E/A ratio:  0.78 Belva Boyden MD Electronically signed by Belva Boyden MD Signature Date/Time: 12/14/2023/12:19:56 PM    Final    CT CHEST ABDOMEN PELVIS WO CONTRAST Result Date: 12/11/2023 CLINICAL DATA:  Cough, intermittent hypoxia, concern for pneumonia. History of COPD, ILD. Significant AKI, noncontrast study please EXAM: CT CHEST, ABDOMEN AND PELVIS WITHOUT CONTRAST TECHNIQUE: Multidetector CT imaging of the chest, abdomen and pelvis was performed following the standard protocol without IV contrast. RADIATION DOSE REDUCTION: This exam was performed according to the departmental dose-optimization program which includes automated exposure control, adjustment of the mA and/or kV according to patient size and/or use of iterative reconstruction technique. COMPARISON:  None Available. FINDINGS: CHEST: Cardiovascular: The thoracic aorta is normal in caliber. The heart is normal in size. No significant pericardial effusion. Four-vessel coronary calcification. Moderate atherosclerotic plaque of the thoracic aorta. Lungs/Pleura: Emphysematous changes. Redemonstration of peripheral reticulations with cystic changes more prominent along the lower lobes. No focal consolidation. No pulmonary nodule. No pulmonary mass. No pulmonary contusion or laceration. No pneumatocele formation. No pleural effusion. No pneumothorax. No hemothorax. Mediastinum/Nodes: No pneumomediastinum. The central airways are patent. The esophagus is unremarkable. The thyroid  is unremarkable. Limited evaluation for hilar lymphadenopathy on this noncontrast study. No mediastinal or axillary lymphadenopathy. Musculoskeletal/Chest wall No chest wall mass. No acute rib or sternal fracture. No spinal fracture. ABDOMEN / PELVIS: Hepatobiliary: Not enlarged. No focal lesion. The gallbladder is otherwise unremarkable with no radio-opaque gallstones. No biliary ductal dilatation. Pancreas: Diffusely  atrophic parenchyma. Normal pancreatic contour. No main pancreatic duct dilatation. Spleen: Not enlarged. No focal lesion. Adrenals/Urinary Tract: No nodularity bilaterally. No hydroureteronephrosis. No nephroureterolithiasis. No contour deforming renal mass. The urinary bladder is unremarkable. Stomach/Bowel: No small or large bowel wall thickening or dilatation. The appendix is unremarkable. Vasculature/Lymphatic: Severe atherosclerotic plaque. Left common/external iliac stent-not well visualized and not evaluated on this noncontrast study. No abdominal aorta or iliac aneurysm. No abdominal, pelvic, inguinal lymphadenopathy. Reproductive: Prostate is unremarkable. Other: No simple free fluid ascites. No pneumoperitoneum. No mesenteric hematoma identified. No organized fluid collection. Musculoskeletal: No significant soft tissue hematoma. No acute pelvic fracture. No spinal fracture. Bilateral L5 pars interarticularis defects with grade 1 anterolisthesis of L5 on S1. Other ports and devices: Left chest wall and abdominal wall soft tissue ventriculoperitoneal shunt with tip terminating in the left lower abdominal quadrant. No kinking or discontinuity of the shunt identified. IMPRESSION: 1. No acute intrathoracic, intra-abdominal, intrapelvic traumatic injury with limited evaluation on this noncontrast study. 2. No acute fracture or traumatic malalignment of the thoracic or lumbar spine. 3. Emphysema (ICD10-J43.9) and interstitial lung disease. 4.  Aortic Atherosclerosis (ICD10-I70.0). Electronically Signed   By: Morgane  Naveau M.D.   On: 12/11/2023 20:02  CT HEAD WO CONTRAST ( ) Result Date: 12/11/2023 CLINICAL DATA:  Trauma Cough, intermittent hypoxia, concern for pneumonia. History of COPD, ILD. Significant AKI, noncontrast study please EXAM: CT HEAD WITHOUT CONTRAST CT CERVICAL SPINE WITHOUT CONTRAST TECHNIQUE: Multidetector CT imaging of the head and cervical spine was performed following the standard  protocol without intravenous contrast. Multiplanar CT image reconstructions of the cervical spine were also generated. RADIATION DOSE REDUCTION: This exam was performed according to the departmental dose-optimization program which includes automated exposure control, adjustment of the mA and/or kV according to patient size and/or use of iterative reconstruction technique. COMPARISON:  CT head and C-spine 05/11/2019 FINDINGS: CT HEAD FINDINGS Brain: Left frontal approach ventriculoperitoneal shunt with tip terminating slightly right of midline in the region of the foramen Monro. Patchy and confluent areas of decreased attenuation are noted throughout the deep and periventricular white matter of the cerebral hemispheres bilaterally, compatible with chronic microvascular ischemic disease. No evidence of large-territorial acute infarction. No parenchymal hemorrhage. No mass lesion. No extra-axial collection. No mass effect or midline shift. No hydrocephalus. Stable Slit-like ventricles. Basilar cisterns are patent. Vascular: No hyperdense vessel. Skull: No acute fracture or focal lesion. Sinuses/Orbits: Paranasal sinuses and mastoid air cells are clear. The orbits are unremarkable. Other: None. CT CERVICAL SPINE FINDINGS Alignment: Grade 1 anterolisthesis of C3 on C4 and C4 on C5. Skull base and vertebrae: Multilevel moderate degenerative changes of the spine. Severe degenerative changes at C1-C2 level with question of old fracture-stable. No severe osseous neural foraminal or central canal stenosis. No acute fracture. No aggressive appearing focal osseous lesion or focal pathologic process. Soft tissues and spinal canal: No prevertebral fluid or swelling. No visible canal hematoma. Upper chest: Emphysematous changes. Otherwise please see separately dictated CT chest 12/11/2023. Other: None. IMPRESSION: 1. No acute traumatic intracranial abnormality. 2. Stable Slit-like ventricles with ventriculoperitoneal shunt in  grossly appropriate position. 3. No acute displaced fracture or traumatic listhesis of the cervical spine. 4. Emphysema (ICD10-J43.9). Otherwise please see separately dictated CT chest 12/11/2023. Electronically Signed   By: Morgane  Naveau M.D.   On: 12/11/2023 19:50   CT Cervical Spine Wo Contrast Result Date: 12/11/2023 CLINICAL DATA:  Trauma Cough, intermittent hypoxia, concern for pneumonia. History of COPD, ILD. Significant AKI, noncontrast study please EXAM: CT HEAD WITHOUT CONTRAST CT CERVICAL SPINE WITHOUT CONTRAST TECHNIQUE: Multidetector CT imaging of the head and cervical spine was performed following the standard protocol without intravenous contrast. Multiplanar CT image reconstructions of the cervical spine were also generated. RADIATION DOSE REDUCTION: This exam was performed according to the departmental dose-optimization program which includes automated exposure control, adjustment of the mA and/or kV according to patient size and/or use of iterative reconstruction technique. COMPARISON:  CT head and C-spine 05/11/2019 FINDINGS: CT HEAD FINDINGS Brain: Left frontal approach ventriculoperitoneal shunt with tip terminating slightly right of midline in the region of the foramen Monro. Patchy and confluent areas of decreased attenuation are noted throughout the deep and periventricular white matter of the cerebral hemispheres bilaterally, compatible with chronic microvascular ischemic disease. No evidence of large-territorial acute infarction. No parenchymal hemorrhage. No mass lesion. No extra-axial collection. No mass effect or midline shift. No hydrocephalus. Stable Slit-like ventricles. Basilar cisterns are patent. Vascular: No hyperdense vessel. Skull: No acute fracture or focal lesion. Sinuses/Orbits: Paranasal sinuses and mastoid air cells are clear. The orbits are unremarkable. Other: None. CT CERVICAL SPINE FINDINGS Alignment: Grade 1 anterolisthesis of C3 on C4 and C4 on C5. Skull base and  vertebrae: Multilevel moderate  degenerative changes of the spine. Severe degenerative changes at C1-C2 level with question of old fracture-stable. No severe osseous neural foraminal or central canal stenosis. No acute fracture. No aggressive appearing focal osseous lesion or focal pathologic process. Soft tissues and spinal canal: No prevertebral fluid or swelling. No visible canal hematoma. Upper chest: Emphysematous changes. Otherwise please see separately dictated CT chest 12/11/2023. Other: None. IMPRESSION: 1. No acute traumatic intracranial abnormality. 2. Stable Slit-like ventricles with ventriculoperitoneal shunt in grossly appropriate position. 3. No acute displaced fracture or traumatic listhesis of the cervical spine. 4. Emphysema (ICD10-J43.9). Otherwise please see separately dictated CT chest 12/11/2023. Electronically Signed   By: Morgane  Naveau M.D.   On: 12/11/2023 19:50   DG Chest Port 1 View Result Date: 12/11/2023 CLINICAL DATA:  Shortness of breath.  Tachycardia. EXAM: PORTABLE CHEST 1 VIEW COMPARISON:  X-ray 05/10/2019. chest CT 03/23/2022. FINDINGS: Overlapping cardiac leads and defibrillator pads. Presumed VP shunt catheter tubing running vertically along the left hemithorax. Enlarged cardiopericardial silhouette. No pneumothorax or effusion. Interstitial changes again seen of the lungs, likely chronic. Films are under penetrated. Apical pleural thickening. IMPRESSION: Diffuse interstitial lung changes, likely chronic. Enlarged cardiac silhouette. Presumed VP shunt catheter tubing running vertically along the left hemithorax. Electronically Signed   By: Adrianna Horde M.D.   On: 12/11/2023 18:56    Microbiology: Recent Results (from the past 240 hours)  Expectorated Sputum Assessment w Gram Stain, Rflx to Resp Cult     Status: None   Collection Time: 12/11/23  2:29 PM   Specimen: Sputum  Result Value Ref Range Status   Specimen Description SPUTUM  Final   Special Requests NONE   Final   Sputum evaluation   Final    THIS SPECIMEN IS ACCEPTABLE FOR SPUTUM CULTURE Performed at [facility], [address] [place], [place] [address]    Report Status 12/13/2023 FINAL  Final  Culture, Respiratory w Gram Stain     Status: None (Preliminary result)   Collection Time: 12/11/23  2:29 PM   Specimen: SPU  Result Value Ref Range Status   Specimen Description   Final    SPUTUM Performed at [facility], [address], [place], [place] [address]    Special Requests   Final    NONE Reflexed from [id number] Performed at [facility], [address] [place], [place] [address]    Gram Stain   Final    RARE WBC PRESENT, PREDOMINANTLY PMN NO ORGANISMS SEEN    Culture   Final    TOO YOUNG TO READ Performed at [facility], [address] [place], [place] [address]    Report Status PENDING  Incomplete  Blood Culture (routine x 2)     Status: None (Preliminary result)   Collection Time: 12/11/23  4:50 PM   Specimen: BLOOD  Result Value Ref Range Status   Specimen Description BLOOD BLOOD RIGHT ARM  Final   Special Requests   Final    BOTTLES DRAWN AEROBIC AND ANAEROBIC Blood Culture adequate volume   Culture   Final    NO GROWTH 3 DAYS Performed at [facility], [address] [place], [place] [address]    Report Status PENDING  Incomplete  Blood Culture (routine x 2)     Status: None (Preliminary result)   Collection Time: 12/11/23  4:55 PM   Specimen: BLOOD  Result Value Ref Range Status   Specimen Description BLOOD BLOOD RIGHT HAND  Final  Special Requests   Final    BOTTLES DRAWN AEROBIC AND ANAEROBIC Blood Culture results may not be optimal due to an inadequate volume of blood received in culture bottles   Culture   Final    NO GROWTH 3 DAYS Performed at [facility], [address] [place], [place] [address]    Report Status PENDING  Incomplete     Labs: CBC: Recent Labs  Lab 12/11/23 1507  12/12/23 0425 12/13/23 0458 12/14/23 0345  WBC 13.2* 10.3 8.0 6.9  HGB 14.0 11.1* 9.9* 10.3*  HCT 41.7 33.9* 29.3* 30.4*  MCV 94.3 97.1 92.4 94.4  PLT 381 286 265 244   Basic Metabolic Panel: Recent Labs  Lab 12/11/23 1507 12/11/23 1755 12/12/23 0425 12/13/23 0458 12/14/23 0345  NA 136  --  135 134* 140  K 3.3*  --  5.1 3.7 5.0  CL 87*  --  93* 95* 95*  CO2 26  --  25 31 34*  GLUCOSE 155*  --  141* 132* 110*  BUN 42*  --  44* 40* 30*  CREATININE 3.23*  --  2.72* 1.87* 1.41*  CALCIUM  8.7*  --  7.6* 7.7* 8.5*  MG  --  1.1* 3.2*  --  2.0  PHOS  --  5.9*  --   --  3.9   Liver Function Tests: No results for input(s): "AST", "ALT", "ALKPHOS", "BILITOT", "PROT", "ALBUMIN" in the last 168 hours. No results for input(s): "LIPASE", "AMYLASE" in the last 168 hours. No results for input(s): "AMMONIA" in the last 168 hours. Cardiac Enzymes: No results for input(s): "CKTOTAL", "CKMB", "CKMBINDEX", "TROPONINI" in the last 168 hours. BNP (last 3 results) No results for input(s): "BNP" in the last 8760 hours. CBG: Recent Labs  Lab 12/13/23 1300 12/13/23 1644 12/13/23 2021 12/14/23 0920 12/14/23 1234  GLUCAP 238* 154* 188* 130* 153*    Time spent: 35 minutes  Signed:  Althia Atlas  [facility] 12/14/2023 4:26 PM

## 2023-12-14 NOTE — Progress Notes (Signed)
 PHARMACY NOTE:  ANTIMICROBIAL RENAL DOSAGE ADJUSTMENT  Current antimicrobial regimen includes a mismatch between antimicrobial dosage and estimated renal function.  As per policy approved by the Pharmacy & Therapeutics and Medical Executive Committees, the antimicrobial dosage will be adjusted accordingly.  Current antimicrobial dosage: Levofloxacin  500 mg PO daily (Rx'd 12/08/23 outpatient x 10d)  Indication: COPDE  Renal Function: Improved since admission  Estimated Creatinine Clearance: 52.2 mL/min (A) (by C-G formula based on SCr of 1.41 mg/dL (H)).    Antimicrobial dosage has been changed to:  Levofloxacin  500 mg PO daily  Additional comments: Tested positive for rhinovirus 12/07/23. Hx of COPD   Lynise Porr Rodriguez-Guzman PharmD, BCPS 12/14/2023 9:25 AM

## 2023-12-14 NOTE — Progress Notes (Signed)
*  PRELIMINARY RESULTS* Echocardiogram 2D Echocardiogram has been performed.  Kirk Robinson 12/14/2023, 12:08 PM

## 2023-12-16 LAB — CULTURE, BLOOD (ROUTINE X 2)
Culture: NO GROWTH
Culture: NO GROWTH
Special Requests: ADEQUATE

## 2023-12-17 LAB — CULTURE, RESPIRATORY W GRAM STAIN

## 2023-12-27 ENCOUNTER — Ambulatory Visit: Admitting: Urology

## 2023-12-27 ENCOUNTER — Ambulatory Visit (INDEPENDENT_AMBULATORY_CARE_PROVIDER_SITE_OTHER): Admitting: Urology

## 2023-12-27 VITALS — BP 104/76 | HR 90 | Ht 69.0 in

## 2023-12-27 DIAGNOSIS — Z466 Encounter for fitting and adjustment of urinary device: Secondary | ICD-10-CM

## 2023-12-27 DIAGNOSIS — N138 Other obstructive and reflux uropathy: Secondary | ICD-10-CM | POA: Diagnosis not present

## 2023-12-27 DIAGNOSIS — N401 Enlarged prostate with lower urinary tract symptoms: Secondary | ICD-10-CM | POA: Diagnosis not present

## 2023-12-27 DIAGNOSIS — Z125 Encounter for screening for malignant neoplasm of prostate: Secondary | ICD-10-CM

## 2023-12-27 LAB — BLADDER SCAN AMB NON-IMAGING

## 2023-12-27 MED ORDER — CIPROFLOXACIN HCL 500 MG PO TABS
500.0000 mg | ORAL_TABLET | Freq: Once | ORAL | Status: AC
  Administered 2023-12-27: 500 mg via ORAL

## 2023-12-27 NOTE — Progress Notes (Signed)
 Catheter Removal  Patient is present today for a catheter removal. 9ml of water was drained from the balloon. A 14FR foley cath was removed from the bladder, no complications were noted. Patient tolerated well. Patient given one time dose of Cipro 500mg  PO per verbal order from Dr. Estanislao Heimlich.   Performed by: Fronie Jewett, CMA (AAMA)  Follow up/ Additional notes: RTC this afternoon for PVR.

## 2023-12-27 NOTE — Progress Notes (Signed)
 12/27/23 8:32 AM   Nature Newport 09/09/1955 [id number]  CC: BPH, urinary retention, PSA screening  HPI: 68 year old comorbid male with a number of other medical issues including COPD on oxygen, interstitial lung disease, A-fib not on anticoagulation, diabetes who had multiple syncopal episodes and presented to the ED on 12/11/2023.  CT showed mild bladder distention and a Foley catheter was placed.  There is no hydronephrosis.  No bladder scan noted.  He reports some baseline urinary symptoms of weak stream with difficulty initiating urination, but has no problem with urination overnight.  He has been on Flomax  for at least 6 months.  He denies any dysuria or gross hematuria.  PSA was normal at 0.72 from August 2024   PMH: Past Medical History:  Diagnosis Date   Alcohol use 03/16/2007   patient risk factors   Allergic rhinitis, cause unspecified    Altered mental status    Anxiety state, unspecified    Cervicalgia 09/17/2007   COPD (chronic obstructive pulmonary disease) (HCC)    Diabetes mellitus without complication (HCC)    Diplopia 12/21/2007   Esophageal reflux    Gastritis 05/13/2009   HCAP (healthcare-associated pneumonia) 08/12/2014   Hematuria 03/16/2007   Hyperthyroidism    graves disease   Iodine  hypothyroidism    Memory loss    NSIP (nonspecific interstitial pneumonia) (HCC)    on azathioprine    Osteoporosis, unspecified    bone density per Morivati in 2012   Other abnormal glucose 04/08/2008   Other diseases of lung, not elsewhere classified 01/23/2009   s/p Fleming/pulmonology consult; intolerant  to Advair   Peripheral vascular disease, unspecified (HCC) 12/24/2009   Personal history of colonic polyps    Pneumonia, organism unspecified(486)    Pure hypercholesterolemia    Shortness of breath    Stroke (HCC) 2005   Tobacco use disorder 03/16/2007   patient risk factors   Unspecified hypothyroidism 01/07/2010   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  CT head negative, MRI brain negative, EEG: diffuse slowing but no  seizure acitivity.  Labs negative.  [facility].   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   COLONOSCOPY WITH PROPOFOL  N/A 03/10/2020   Procedure: COLONOSCOPY WITH PROPOFOL ;  Surgeon: Luke Salaam, MD;  Location: [facility] ENDOSCOPY;  Service: Gastroenterology;  Laterality: N/A;   COLONOSCOPY WITH PROPOFOL  N/A 03/16/2021   Procedure: COLONOSCOPY WITH PROPOFOL ;  Surgeon: Luke Salaam, MD;  Location: [facility] ENDOSCOPY;  Service: Gastroenterology;  Laterality: N/A;   COLONOSCOPY WITH PROPOFOL  N/A 06/22/2022   Procedure: COLONOSCOPY WITH PROPOFOL ;  Surgeon: Luke Salaam, MD;  Location: [facility] ENDOSCOPY;  Service: Endoscopy;  Laterality: N/A;   ESOPHAGOGASTRODUODENOSCOPY (EGD) WITH PROPOFOL  N/A 03/16/2021   Procedure: ESOPHAGOGASTRODUODENOSCOPY (EGD) WITH PROPOFOL ;  Surgeon: Luke Salaam, MD;  Location: [facility] ENDOSCOPY;  Service: Gastroenterology;  Laterality: N/A;   EYE SURGERY  2010   GIVENS CAPSULE STUDY N/A 04/16/2021   Procedure: GIVENS CAPSULE STUDY;  Surgeon: Luke Salaam, MD;  Location: [facility] ENDOSCOPY;  Service: Gastroenterology;  Laterality: N/A;   LAPAROSCOPIC REVISION VENTRICULAR-PERITONEAL (V-P) SHUNT Right 05/20/2014   Procedure: LAPAROSCOPIC REVISION VENTRICULAR-PERITONEAL (V-P) SHUNT;  Surgeon: Dorena Gander, MD;  Location: [facility] NEURO ORS;  Service: General;  Laterality: Right;   LOWER EXTREMITY ANGIOGRAPHY Left 06/11/2020   Procedure: LOWER EXTREMITY ANGIOGRAPHY;  Surgeon: Celso College, MD;  Location: [facility] INVASIVE CV  LAB;  Service: Cardiovascular;  Laterality: Left;   Neuropsychiatric evaluation  04/2012   poor short-term memory, poor recall, delayed reaction time; permanently disabled.       Family History: Family History  Problem Relation Age of Onset    Heart disease Father        cad, chf   Cancer Father    Heart disease Brother 52       CABG   Cancer Brother 45       pancreatic cancer   Diabetes Brother    Diabetes Brother    Heart disease Brother 96       CABG   Diabetes Brother    Heart disease Brother 19       CABG   Cancer Brother 2       prostate cancer   Heart disease Mother        cad   Diabetes Mother    Stroke Mother     Social History:  reports that he quit smoking about 3 years ago. His smoking use included cigarettes. He started smoking about 33 years ago. He has a 30 pack-year smoking history. He has never used smokeless tobacco. He reports that he does not drink alcohol and does not use drugs.  Physical Exam: BP 104/76 (BP Location: Right Arm, Patient Position: Sitting, Cuff Size: Normal)   Pulse 90   Ht 5\' 9"  (1.753 m)   SpO2 92%   BMI 25.10 kg/m    Constitutional: Frail, on oxygen, in wheelchair Cardiovascular: No clubbing, cyanosis, or edema. Respiratory: Normal respiratory effort, no increased work of breathing. GI: Abdomen is soft, nontender, nondistended, no abdominal masses   Laboratory Data: Reviewed, see HPI  Pertinent Imaging: I have personally viewed and interpreted the CT scan showing mild bladder distention, no hydronephrosis, prostate measures 19g.  Assessment & Plan:   68 year old male with a number of comorbidities with recent catheter placement for possible incomplete bladder emptying, but no PVR checked and no hydronephrosis on CT.  May be related to his Breztri  inhaler causing increased prostatic tone and incomplete emptying.  Cipro was given for prophylaxis.  Foley was removed today and he has been able to void multiple times without issue, PVR this afternoon normal at .  I recommended increasing the Flomax  dose to 0.8 mg nightly.  I discontinued the finasteride , is not indicated for prostate <40g in volume.  Could consider other alternatives like UroLift or even HOLEP if  recurrent episodes of retention despite max dose Flomax .  Flomax  increased to 0.8 mg nightly Return precautions discussed RTC 4 months IPSS, PVR   Jay Meth, MD 12/27/2023  [facility] [address] [place], [place] [address] [phone]

## 2023-12-28 ENCOUNTER — Ambulatory Visit: Admitting: Urology

## 2024-01-09 ENCOUNTER — Encounter (INDEPENDENT_AMBULATORY_CARE_PROVIDER_SITE_OTHER): Payer: Self-pay

## 2024-01-12 ENCOUNTER — Ambulatory Visit (INDEPENDENT_AMBULATORY_CARE_PROVIDER_SITE_OTHER): Admitting: Vascular Surgery

## 2024-01-12 ENCOUNTER — Encounter (INDEPENDENT_AMBULATORY_CARE_PROVIDER_SITE_OTHER): Payer: Self-pay | Admitting: Vascular Surgery

## 2024-01-12 ENCOUNTER — Ambulatory Visit (INDEPENDENT_AMBULATORY_CARE_PROVIDER_SITE_OTHER)

## 2024-01-12 VITALS — BP 106/78 | HR 106 | Resp 18 | Wt 165.0 lb

## 2024-01-12 DIAGNOSIS — E1122 Type 2 diabetes mellitus with diabetic chronic kidney disease: Secondary | ICD-10-CM | POA: Diagnosis not present

## 2024-01-12 DIAGNOSIS — I739 Peripheral vascular disease, unspecified: Secondary | ICD-10-CM

## 2024-01-12 DIAGNOSIS — Z9889 Other specified postprocedural states: Secondary | ICD-10-CM | POA: Diagnosis not present

## 2024-01-12 DIAGNOSIS — E7800 Pure hypercholesterolemia, unspecified: Secondary | ICD-10-CM | POA: Diagnosis not present

## 2024-01-12 DIAGNOSIS — I70212 Atherosclerosis of native arteries of extremities with intermittent claudication, left leg: Secondary | ICD-10-CM

## 2024-01-12 DIAGNOSIS — R0602 Shortness of breath: Secondary | ICD-10-CM | POA: Insufficient documentation

## 2024-01-12 DIAGNOSIS — N183 Chronic kidney disease, stage 3 unspecified: Secondary | ICD-10-CM

## 2024-01-12 NOTE — Assessment & Plan Note (Signed)
 His ABIs today are 1.13 on the right and 1.26 on the left with triphasic waveforms and normal digital pressures and waveforms. His perfusion is now normal after left iliac intervention previously.  I do not believe his current symptoms are related to any sort of malperfusion.  I would be much more concerned about a neuropathic source or potentially musculoskeletal source.  Asked him to discuss this either with his primary care physician or consider an orthopedic evaluation.  We will continue to follow his perfusion on an annual basis.

## 2024-01-12 NOTE — Progress Notes (Signed)
 MRN : [id number]  Kirk Robinson is a 68 y.o. (01-Mar-1956) male who presents with chief complaint of  Chief Complaint  Patient presents with   Follow-up    35yr abi follow up  .  History of Present Illness: Patient returns today in follow up of his PAD.  He is currently complaining of a lot of pain in the back of his legs particularly around the knees.  Seems to bother him mostly with sitting in certain positions.  No open wounds or infection.  No fevers or chills.  He is limited by his other issues enough that he is really not walking all that much and it is difficult to discern if he has significant claudication symptoms.  He had a previous left iliac intervention about 40 years ago.  His ABIs today are 1.13 on the right and 1.26 on the left with triphasic waveforms and normal digital pressures and waveforms.  Current Outpatient Medications  Medication Sig Dispense Refill   Accu-Chek FastClix Lancets MISC Apply topically daily.     albuterol  (PROVENTIL ) (2.5 MG/3ML) 0.083% nebulizer solution Inhale 2.5 mg into the lungs every 6 (six) hours as needed for wheezing.     albuterol  (VENTOLIN  HFA) 108 (90 Base) MCG/ACT inhaler Inhale 2 puffs into the lungs every 6 (six) hours as needed for wheezing or shortness of breath. 18 g 0   ARIPiprazole  (ABILIFY ) 5 MG tablet Take 5 mg by mouth daily.     ASPIRIN  LOW DOSE 81 MG tablet TAKE 1 TABLET BY MOUTH EVERY DAY 90 tablet 4   atorvastatin  (LIPITOR) 20 MG tablet Take 1 tablet (20 mg total) by mouth daily at 6 PM. 90 tablet 3   Blood Glucose Monitoring Suppl (ACCU-CHEK GUIDE) w/Device KIT See admin instructions.     BREZTRI  AEROSPHERE 160-9-4.8 MCG/ACT AERO SMARTSIG:2 Inhalation Via Inhaler Twice Daily     clopidogrel  (PLAVIX ) 75 MG tablet TAKE 1 TABLET BY MOUTH EVERY DAY 90 tablet 3   diazepam  (VALIUM ) 5 MG tablet Take 5 mg by mouth every 12 (twelve) hours.     Docusate Sodium  100 MG capsule Take 100 mg by mouth in the morning, at noon, and at  bedtime.     Ensure Max Protein (ENSURE MAX PROTEIN) LIQD Take 330 mLs (11 oz total) by mouth 2 (two) times daily. 330 mL 0   glucose blood test strip See admin instructions.     ibandronate (BONIVA) 150 MG tablet Take 150 mg by mouth every 30 (thirty) days.     ipratropium (ATROVENT ) 0.03 % nasal spray Place 1 spray into the nose 2 (two) times daily as needed for rhinitis.     ipratropium-albuterol  (DUONEB) 0.5-2.5 (3) MG/3ML SOLN Inhale 3 mLs into the lungs 4 (four) times daily.     levothyroxine  (SYNTHROID ) 100 MCG tablet Take 100 mcg by mouth daily before breakfast.     metFORMIN  (GLUCOPHAGE ) 1000 MG tablet TAKE 1 TABLET BY MOUTH 2 TIMES DAILY WITH MEAL. (Patient taking differently: Take 1,000 mg by mouth 2 (two) times daily with a meal.) 180 tablet 0   metoprolol  succinate (TOPROL -XL) 25 MG 24 hr tablet Take 12.5 mg by mouth daily.     midodrine (PROAMATINE) 2.5 MG tablet Take 2.5 mg by mouth 2 (two) times daily with a meal.     mirtazapine  (REMERON ) 45 MG tablet Take 45 mg by mouth at bedtime.     morphine  (MS CONTIN ) 30 MG 12 hr tablet Take 30 mg by mouth every 12 (  twelve) hours.     naloxone  (NARCAN ) nasal spray 4 mg/0.1 mL Place into the nose.     pantoprazole  (PROTONIX ) 40 MG tablet Take 40 mg by mouth 2 (two) times daily before a meal.     Pirfenidone 267 MG TABS Take 801 mg by mouth 3 (three) times daily.     predniSONE  (DELTASONE ) 10 MG tablet Take 10 mg by mouth daily.     tamsulosin  (FLOMAX ) 0.4 MG CAPS capsule Take 0.8 mg by mouth daily after supper.     torsemide (DEMADEX) 20 MG tablet Take 1 tablet (20 mg total) by mouth daily. Skip the dose if systolic BP less than 120 mmHg     venlafaxine  XR (EFFEXOR -XR) 150 MG 24 hr capsule Take 1 capsule (150 mg total) by mouth daily with breakfast. 90 capsule 1   Vitamin D , Ergocalciferol , (DRISDOL ) 1.25 MG (50000 UNIT) CAPS capsule Take 50,000 Units by mouth every 7 (seven) days.     rOPINIRole  (REQUIP ) 0.5 MG tablet Take 0.5 mg by mouth  at bedtime.     No current facility-administered medications for this visit.    Past Medical History:  Diagnosis Date   Alcohol use 03/16/2007   patient risk factors   Allergic rhinitis, cause unspecified    Altered mental status    Anxiety state, unspecified    Cervicalgia 09/17/2007   COPD (chronic obstructive pulmonary disease) (HCC)    Diabetes mellitus without complication (HCC)    Diplopia 12/21/2007   Esophageal reflux    Gastritis 05/13/2009   HCAP (healthcare-associated pneumonia) 08/12/2014   Hematuria 03/16/2007   Hyperthyroidism    graves disease   Iodine  hypothyroidism    Memory loss    NSIP (nonspecific interstitial pneumonia) (HCC)    on azathioprine    Osteoporosis, unspecified    bone density per Mor[facility]ati in 2012   Other abnormal glucose 04/08/2008   Other diseases of lung, not elsewhere classified 01/23/2009   s/p Fleming/pulmonology consult; intolerant  to Advair   Peripheral vascular disease, unspecified (HCC) 12/24/2009   Personal history of colonic polyps    Pneumonia, organism unspecified(486)    Pure hypercholesterolemia    Shortness of breath    Stroke (HCC) 2005   Tobacco use disorder 03/16/2007   patient risk factors   Unspecified hypothyroidism 01/07/2010   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  CT head negative, MRI brain negative, EEG: diffuse slowing but no  seizure acitivity.  Labs negative.  [facility].   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   COLONOSCOPY WITH PROPOFOL  N/A 03/10/2020   Procedure: COLONOSCOPY WITH PROPOFOL ;  Surgeon: Luke Salaam, MD;  Location: [facility] ENDOSCOPY;  Service: Gastroenterology;  Laterality: N/A;   COLONOSCOPY WITH PROPOFOL  N/A 03/16/2021   Procedure: COLONOSCOPY WITH PROPOFOL ;  Surgeon: Luke Salaam, MD;  Location: [facility] ENDOSCOPY;  Service: Gastroenterology;   Laterality: N/A;   COLONOSCOPY WITH PROPOFOL  N/A 06/22/2022   Procedure: COLONOSCOPY WITH PROPOFOL ;  Surgeon: Luke Salaam, MD;  Location: [facility] ENDOSCOPY;  Service: Endoscopy;  Laterality: N/A;   ESOPHAGOGASTRODUODENOSCOPY (EGD) WITH PROPOFOL  N/A 03/16/2021   Procedure: ESOPHAGOGASTRODUODENOSCOPY (EGD) WITH PROPOFOL ;  Surgeon: Luke Salaam, MD;  Location: [facility] ENDOSCOPY;  Service: Gastroenterology;  Laterality: N/A;   EYE SURGERY  2010   GIVENS CAPSULE STUDY N/A 04/16/2021   Procedure: GIVENS CAPSULE STUDY;  Surgeon: Luke Salaam, MD;  Location:  [facility] ENDOSCOPY;  Service: Gastroenterology;  Laterality: N/A;   LAPAROSCOPIC REVISION VENTRICULAR-PERITONEAL (V-P) SHUNT Right 05/20/2014   Procedure: LAPAROSCOPIC REVISION VENTRICULAR-PERITONEAL (V-P) SHUNT;  Surgeon: Dorena Gander, MD;  Location: [facility] NEURO ORS;  Service: General;  Laterality: Right;   LOWER EXTREMITY ANGIOGRAPHY Left 06/11/2020   Procedure: LOWER EXTREMITY ANGIOGRAPHY;  Surgeon: Celso College, MD;  Location: [facility] INVASIVE CV LAB;  Service: Cardiovascular;  Laterality: Left;   Neuropsychiatric evaluation  04/2012   poor short-term memory, poor recall, delayed reaction time; permanently disabled.       Social History   Tobacco Use   Smoking status: Former    Current packs/day: 0.00    Average packs/day: 1 pack/day for 30.0 years (30.0 ttl pk-yrs)    Types: Cigarettes    Start date: 08/22/1990    Quit date: 08/22/2020    Years since quitting: 3.3   Smokeless tobacco: Never  Vaping Use   Vaping status: Never Used  Substance Use Topics   Alcohol use: No   Drug use: No      Family History  Problem Relation Age of Onset   Heart disease Father        cad, chf   Cancer Father    Heart disease Brother 34       CABG   Cancer Brother 60       pancreatic cancer   Diabetes Brother    Diabetes Brother    Heart disease Brother 36       CABG   Diabetes Brother    Heart disease Brother 73       CABG   Cancer Brother 91       prostate  cancer   Heart disease Mother        cad   Diabetes Mother    Stroke Mother     Allergies  Allergen Reactions   Advair Diskus [Fluticasone-Salmeterol] Shortness Of Breath   Erythromycin Nausea And Vomiting   Iodinated Contrast Media Shortness Of Breath and Other (See Comments)   Ativan  [Lorazepam ] Other (See Comments)    Caused pt's heart to stop   Cephalexin Other (See Comments)    Heart Burn   Darvocet [Propoxyphene N-Acetaminophen ] Nausea And Vomiting   Doxycycline Hyclate Nausea And Vomiting, Swelling and Other (See Comments)    Tongue swelling, severe depression   Propoxyphene Nausea And Vomiting   Septra [Sulfamethoxazole-Trimethoprim] Rash       REVIEW OF SYSTEMS (Negative unless checked)   Constitutional: [] Weight loss  [] Fever  [] Chills Cardiac: [] Chest pain   [] Chest pressure   [] Palpitations   [] Shortness of breath when laying flat   [] Shortness of breath at rest   [] Shortness of breath with exertion. Vascular:  [x] Pain in legs with walking   [x] Pain in legs at rest   [] Pain in legs when laying flat   [x] Claudication   [] Pain in feet when walking  [] Pain in feet at rest  [] Pain in feet when laying flat   [] History of DVT   [] Phlebitis   [] Swelling in legs   [] Varicose veins   [] Non-healing ulcers Pulmonary:   [x] Uses home oxygen   [] Productive cough   [] Hemoptysis   [] Wheeze  [x] COPD   [] Asthma Neurologic:  [] Dizziness  [] Blackouts   [] Seizures   [x] History of stroke   [] History of TIA  [] Aphasia   [] Temporary blindness   [] Dysphagia   [] Weakness or numbness in arms   [] Weakness or numbness in legs Musculoskeletal:  [x] Arthritis   [] Joint swelling   [  x]Joint pain   [] Low back pain Hematologic:  [] Easy bruising  [] Easy bleeding   [] Hypercoagulable state   [] Anemic  [] Hepatitis Gastrointestinal:  [] Blood in stool   [] Vomiting blood  [] Gastroesophageal reflux/heartburn   [] Abdominal pain Genitourinary:  [] Chronic kidney disease   [] Difficult urination  [] Frequent urination   [] Burning with urination   [] Hematuria Skin:  [] Rashes   [] Ulcers   [] Wounds Psychological:  [] History of anxiety   []  History of major depression.    Physical Examination  BP 106/78   Pulse (!) 106   Resp 18   Wt 165 lb (74.8 kg)   BMI 24.37 kg/m  Gen:  WD/WN, NAD. Appears older than age Head: [place]/AT, No temporalis wasting. Ear/Nose/Throat: Hearing grossly intact, nares w/o erythema or drainage Eyes: Conjunctiva clear. Sclera non-icteric Neck: Supple.  Trachea midline Pulmonary:  Good air movement, no use of accessory muscles on oxygen.  Cardiac: tachycardic Vascular:  Vessel Right Left  Radial Palpable Palpable                          PT Palpable Palpable  DP Palpable Palpable   Gastrointestinal: soft, non-tender/non-distended. No guarding/reflex.  Musculoskeletal: M/S 5/5 throughout.  No deformity or atrophy. In a wheelchair. Trace LE edema. Neurologic: Sensation grossly intact in extremities.  Symmetrical.  Speech is fluent.  Psychiatric: Judgment intact, Mood & affect appropriate for pt's clinical situation. Dermatologic: No rashes or ulcers noted.  No cellulitis or open wounds.      Labs Recent Results (from the past 2160 hours)  Expectorated Sputum Assessment w Gram Stain, Rflx to Resp Cult     Status: None   Collection Time: 12/11/23  2:29 PM   Specimen: Sputum  Result Value Ref Range   Specimen Description SPUTUM    Special Requests NONE    Sputum evaluation      THIS SPECIMEN IS ACCEPTABLE FOR SPUTUM CULTURE Performed at [facility], [address] [place], [place] [address]    Report Status 12/13/2023 FINAL   Culture, Respiratory w Gram Stain     Status: None   Collection Time: 12/11/23  2:29 PM   Specimen: SPU  Result Value Ref Range   Specimen Description      SPUTUM Performed at [facility], [address], [place], [place] [address]    Special Requests      NONE Reflexed from [id number] Performed at [facility], [address] [place], [place] [address]    Gram Stain      RARE WBC PRESENT, PREDOMINANTLY PMN NO ORGANISMS SEEN Performed at [facility], [address] [place], [place] [address]    Culture FEW CANDIDA ALBICANS    Report Status 12/17/2023 FINAL   Basic metabolic panel     Status: Abnormal   Collection Time: 12/11/23  3:07 PM  Result Value Ref Range   Sodium 136 135 - 145 mmol/L    Comment: ELECTROLYTES REPEATED TO VERIFY  SH   Potassium 3.3 (L) 3.5 - 5.1 mmol/L   Chloride 87 (L) 98 - 111 mmol/L   CO2 26 22 - 32 mmol/L   Glucose, Bld 155 (H) 70 - 99 mg/dL    Comment: Glucose reference range applies only to samples taken after fasting for at least 8 hours.   BUN 42 (H) 8 - 23 mg/dL   Creatinine, Ser 7.84 (H) 0.61 - 1.24 mg/dL   Calcium  8.7 (L) 8.9 - 10.3 mg/dL  GFR, Estimated 20 (L) >60 mL/min    Comment: (NOTE) Calculated using the CKD-EPI Creatinine Equation (2021)    Anion gap 23 (H) 5 - 15    Comment: Performed at [facility], [address] [place], [place] [address]  CBC     Status: Abnormal   Collection Time: 12/11/23  3:07 PM  Result Value Ref Range   WBC 13.2 (H) 4.0 - 10.5 K/uL   RBC 4.42 4.22 - 5.81 MIL/uL   Hemoglobin 14.0 13.0 - 17.0 g/dL   HCT 60.4 54.0 - 98.1 %   MCV 94.3 80.0 - 100.0 fL   MCH 31.7 26.0 - 34.0 pg   MCHC 33.6 30.0 - 36.0 g/dL   RDW 19.1 47.8 - 29.5 %   Platelets 381 150 - 400 K/uL   nRBC 0.0 0.0 - 0.2 %    Comment: Performed at [facility], [address], [place], [place] [address]  Troponin I (High Sensitivity)     Status: None   Collection Time: 12/11/23  3:07 PM  Result Value Ref Range   Troponin I (High Sensitivity) 14 <18 ng/L    Comment: (NOTE) Elevated high sensitivity troponin I (hsTnI) values and significant  changes across serial measurements may suggest ACS but many other  chronic and acute conditions are known to elevate hsTnI results.  Refer to the "Links" section for chest pain  algorithms and additional  guidance. Performed at [facility], [address] [place], [place] [address]   Protime-INR     Status: None   Collection Time: 12/11/23  3:43 PM  Result Value Ref Range   Prothrombin Time 14.8 11.4 - 15.2 seconds   INR 1.1 0.8 - 1.2    Comment: (NOTE) INR goal varies based on device and disease states. Performed at [facility], [address] [place], [place] [address]   APTT     Status: None   Collection Time: 12/11/23  3:43 PM  Result Value Ref Range   aPTT 29 24 - 36 seconds    Comment: Performed at [facility], [address] [place], [place] [address]  Lactic acid, plasma     Status: Abnormal   Collection Time: 12/11/23  4:50 PM  Result Value Ref Range   Lactic Acid, Venous 4.8 (HH) 0.5 - 1.9 mmol/L    Comment: CRITICAL RESULT CALLED TO, READ BACK BY AND VERIFIED WITH ERIKA PINEDA @1745  ON 12/11/23 SKL Performed at [facility], [address] [place], [place] [address]   Blood Culture (routine x 2)     Status: None   Collection Time: 12/11/23  4:50 PM   Specimen: BLOOD  Result Value Ref Range   Specimen Description BLOOD BLOOD RIGHT ARM    Special Requests      BOTTLES DRAWN AEROBIC AND ANAEROBIC Blood Culture adequate volume   Culture      NO GROWTH 5 DAYS Performed at [facility], [address] [place], [place] [address]    Report Status 12/16/2023 FINAL   Blood Culture (routine x 2)     Status: None   Collection Time: 12/11/23  4:55 PM   Specimen: BLOOD  Result Value Ref Range   Specimen Description BLOOD BLOOD RIGHT HAND    Special Requests      BOTTLES DRAWN AEROBIC AND ANAEROBIC Blood Culture results may not be optimal due to an inadequate volume of blood received in culture bottles   Culture  NO GROWTH 5 DAYS Performed at [facility], [address] [place], [place] [address]    Report Status 12/16/2023 FINAL   Lactic acid, plasma     Status: Abnormal    Collection Time: 12/11/23  5:55 PM  Result Value Ref Range   Lactic Acid, Venous 4.8 (HH) 0.5 - 1.9 mmol/L    Comment: CRITICAL VALUE NOTED. VALUE IS CONSISTENT WITH PREVIOUSLY REPORTED/CALLED VALUE SKL Performed at [facility], [address] [place], [place] [address]   Urinalysis, w/ Reflex to Culture (Infection Suspected) -Urine, Clean Catch     Status: Abnormal   Collection Time: 12/11/23  5:55 PM  Result Value Ref Range   Specimen Source URINE, CLEAN CATCH    Color, Urine STRAW (A) YELLOW   APPearance CLEAR (A) CLEAR   Specific Gravity, Urine 1.008 1.005 - 1.030   pH 5.0 5.0 - 8.0   Glucose, UA NEGATIVE NEGATIVE mg/dL   Hgb urine dipstick NEGATIVE NEGATIVE   Bilirubin Urine NEGATIVE NEGATIVE   Ketones, ur NEGATIVE NEGATIVE mg/dL   Protein, ur NEGATIVE NEGATIVE mg/dL   Nitrite NEGATIVE NEGATIVE   Leukocytes,Ua NEGATIVE NEGATIVE   RBC / HPF 0-5 0 - 5 RBC/hpf   WBC, UA 0-5 0 - 5 WBC/hpf    Comment:        Reflex urine culture not performed if WBC <=10, OR if Squamous epithelial cells >5. If Squamous epithelial cells >5 suggest recollection.    Bacteria, UA RARE (A) NONE SEEN   Squamous Epithelial / HPF 0 0 - 5 /HPF   Mucus PRESENT     Comment: Performed at [facility], [address] [place], [place] [address]  Troponin I (High Sensitivity)     Status: None   Collection Time: 12/11/23  5:55 PM  Result Value Ref Range   Troponin I (High Sensitivity) 16 <18 ng/L    Comment: (NOTE) Elevated high sensitivity troponin I (hsTnI) values and significant  changes across serial measurements may suggest ACS but many other  chronic and acute conditions are known to elevate hsTnI results.  Refer to the "Links" section for chest pain algorithms and additional  guidance. Performed at [facility], [address] [place], [place] [address]   Magnesium      Status: Abnormal   Collection Time: 12/11/23  5:55 PM  Result Value Ref Range   Magnesium   1.1 (L) 1.7 - 2.4 mg/dL    Comment: Performed at [facility], [address] [place], [place] [address]  Phosphorus     Status: Abnormal   Collection Time: 12/11/23  5:55 PM  Result Value Ref Range   Phosphorus 5.9 (H) 2.5 - 4.6 mg/dL    Comment: Performed at [facility], [address] [place], [place] [address]  TSH     Status: Abnormal   Collection Time: 12/11/23  5:55 PM  Result Value Ref Range   TSH 8.014 (H) 0.350 - 4.500 uIU/mL    Comment: Performed by a 3rd Generation assay with a functional sensitivity of <=0.01 uIU/mL. Performed at [facility], [address] [place], [place] [address]   CBG monitoring, ED     Status: Abnormal   Collection Time: 12/11/23  9:45 PM  Result Value Ref Range   Glucose-Capillary 104 (H) 70 - 99 mg/dL    Comment: Glucose reference range applies only to samples taken after fasting for at least 8 hours.  HIV Antibody (routine testing w rflx)  Status: None   Collection Time: 12/12/23 12:01 AM  Result Value Ref Range   HIV Screen 4th Generation wRfx Non Reactive Non Reactive    Comment: Performed at [facility], [address] [place], [place] [address]  Lactic acid, plasma     Status: Abnormal   Collection Time: 12/12/23 12:01 AM  Result Value Ref Range   Lactic Acid, Venous 4.6 (HH) 0.5 - 1.9 mmol/L    Comment: CRITICAL VALUE NOTED. VALUE IS CONSISTENT WITH PREVIOUSLY REPORTED/CALLED VALUE skl Performed at [facility], [address] [place], [place] [address]   Lactic acid, plasma     Status: Abnormal   Collection Time: 12/12/23  1:29 AM  Result Value Ref Range   Lactic Acid, Venous 4.4 (HH) 0.5 - 1.9 mmol/L    Comment: CRITICAL VALUE NOTED. VALUE IS CONSISTENT WITH PREVIOUSLY REPORTED/CALLED VALUE skl Performed at [facility], [address] [place], [place] [address]   Basic metabolic panel     Status: Abnormal   Collection Time: 12/12/23  4:25 AM  Result Value Ref  Range   Sodium 135 135 - 145 mmol/L   Potassium 5.1 3.5 - 5.1 mmol/L   Chloride 93 (L) 98 - 111 mmol/L   CO2 25 22 - 32 mmol/L   Glucose, Bld 141 (H) 70 - 99 mg/dL    Comment: Glucose reference range applies only to samples taken after fasting for at least 8 hours.   BUN 44 (H) 8 - 23 mg/dL   Creatinine, Ser 0.34 (H) 0.61 - 1.24 mg/dL   Calcium  7.6 (L) 8.9 - 10.3 mg/dL   GFR, Estimated 25 (L) >60 mL/min    Comment: (NOTE) Calculated using the CKD-EPI Creatinine Equation (2021)    Anion gap 17 (H) 5 - 15    Comment: Performed at [facility], [address] [place], [place] [address]  CBC     Status: Abnormal   Collection Time: 12/12/23  4:25 AM  Result Value Ref Range   WBC 10.3 4.0 - 10.5 K/uL   RBC 3.49 (L) 4.22 - 5.81 MIL/uL   Hemoglobin 11.1 (L) 13.0 - 17.0 g/dL   HCT 56.3 (L) 87.5 - 64.3 %   MCV 97.1 80.0 - 100.0 fL   MCH 31.8 26.0 - 34.0 pg   MCHC 32.7 30.0 - 36.0 g/dL   RDW 32.9 51.8 - 84.1 %   Platelets 286 150 - 400 K/uL   nRBC 0.0 0.0 - 0.2 %    Comment: Performed at [facility], [address] [place], [place] [address]  Magnesium      Status: Abnormal   Collection Time: 12/12/23  4:25 AM  Result Value Ref Range   Magnesium  3.2 (H) 1.7 - 2.4 mg/dL    Comment: Performed at [facility], [address] [place], [place] [address]  Lactic acid, plasma     Status: Abnormal   Collection Time: 12/12/23  4:25 AM  Result Value Ref Range   Lactic Acid, Venous 4.1 (HH) 0.5 - 1.9 mmol/L    Comment: CRITICAL VALUE NOTED. VALUE IS CONSISTENT WITH PREVIOUSLY REPORTED/CALLED VALUE MW Performed at [facility], [address] [place], [place] [address]   CBG monitoring, ED     Status: Abnormal   Collection Time: 12/12/23  7:52 AM  Result Value Ref Range   Glucose-Capillary 112 (H) 70 - 99 mg/dL    Comment: Glucose reference range applies only to samples taken after fasting for  at least 8 hours.  CBG monitoring, ED     Status:  Abnormal   Collection Time: 12/12/23 11:25 AM  Result Value Ref Range   Glucose-Capillary 113 (H) 70 - 99 mg/dL    Comment: Glucose reference range applies only to samples taken after fasting for at least 8 hours.  Lactic acid, plasma     Status: Abnormal   Collection Time: 12/12/23  3:07 PM  Result Value Ref Range   Lactic Acid, Venous 2.5 (HH) 0.5 - 1.9 mmol/L    Comment: CRITICAL VALUE NOTED. VALUE IS CONSISTENT WITH PREVIOUSLY REPORTED/CALLED VALUE MU Performed at [facility], [address] [place], [place] [address]   Glucose, capillary     Status: Abnormal   Collection Time: 12/12/23  4:53 PM  Result Value Ref Range   Glucose-Capillary 216 (H) 70 - 99 mg/dL    Comment: Glucose reference range applies only to samples taken after fasting for at least 8 hours.  Glucose, capillary     Status: Abnormal   Collection Time: 12/12/23 10:21 PM  Result Value Ref Range   Glucose-Capillary 105 (H) 70 - 99 mg/dL    Comment: Glucose reference range applies only to samples taken after fasting for at least 8 hours.  CBC     Status: Abnormal   Collection Time: 12/13/23  4:58 AM  Result Value Ref Range   WBC 8.0 4.0 - 10.5 K/uL   RBC 3.17 (L) 4.22 - 5.81 MIL/uL   Hemoglobin 9.9 (L) 13.0 - 17.0 g/dL   HCT 02.7 (L) 25.3 - 66.4 %   MCV 92.4 80.0 - 100.0 fL   MCH 31.2 26.0 - 34.0 pg   MCHC 33.8 30.0 - 36.0 g/dL   RDW 40.3 47.4 - 25.9 %   Platelets 265 150 - 400 K/uL   nRBC 0.0 0.0 - 0.2 %    Comment: Performed at [facility], [address] [place], [place] [address]  Basic metabolic panel     Status: Abnormal   Collection Time: 12/13/23  4:58 AM  Result Value Ref Range   Sodium 134 (L) 135 - 145 mmol/L   Potassium 3.7 3.5 - 5.1 mmol/L   Chloride 95 (L) 98 - 111 mmol/L   CO2 31 22 - 32 mmol/L   Glucose, Bld 132 (H) 70 - 99 mg/dL    Comment: Glucose reference range applies only to samples taken after fasting for at least 8 hours.   BUN 40 (H) 8 - 23 mg/dL    Creatinine, Ser 5.64 (H) 0.61 - 1.24 mg/dL   Calcium  7.7 (L) 8.9 - 10.3 mg/dL   GFR, Estimated 39 (L) >60 mL/min    Comment: (NOTE) Calculated using the CKD-EPI Creatinine Equation (2021)    Anion gap 8 5 - 15    Comment: Performed at [facility], [address] [place], [place] [address]  Glucose, capillary     Status: Abnormal   Collection Time: 12/13/23  8:21 AM  Result Value Ref Range   Glucose-Capillary 109 (H) 70 - 99 mg/dL    Comment: Glucose reference range applies only to samples taken after fasting for at least 8 hours.  Glucose, capillary     Status: Abnormal   Collection Time: 12/13/23  1:00 PM  Result Value Ref Range   Glucose-Capillary 238 (H) 70 - 99 mg/dL    Comment: Glucose reference range applies only to samples taken after fasting for at least 8 hours.  Glucose, capillary  Status: Abnormal   Collection Time: 12/13/23  4:44 PM  Result Value Ref Range   Glucose-Capillary 154 (H) 70 - 99 mg/dL    Comment: Glucose reference range applies only to samples taken after fasting for at least 8 hours.  Glucose, capillary     Status: Abnormal   Collection Time: 12/13/23  8:21 PM  Result Value Ref Range   Glucose-Capillary 188 (H) 70 - 99 mg/dL    Comment: Glucose reference range applies only to samples taken after fasting for at least 8 hours.  Basic metabolic panel with GFR     Status: Abnormal   Collection Time: 12/14/23  3:45 AM  Result Value Ref Range   Sodium 140 135 - 145 mmol/L   Potassium 5.0 3.5 - 5.1 mmol/L   Chloride 95 (L) 98 - 111 mmol/L   CO2 34 (H) 22 - 32 mmol/L   Glucose, Bld 110 (H) 70 - 99 mg/dL    Comment: Glucose reference range applies only to samples taken after fasting for at least 8 hours.   BUN 30 (H) 8 - 23 mg/dL   Creatinine, Ser 1.61 (H) 0.61 - 1.24 mg/dL   Calcium  8.5 (L) 8.9 - 10.3 mg/dL   GFR, Estimated 55 (L) >60 mL/min    Comment: (NOTE) Calculated using the CKD-EPI Creatinine Equation (2021)    Anion gap 11 5 - 15     Comment: Performed at [facility], [address] [place], [place] [address]  CBC     Status: Abnormal   Collection Time: 12/14/23  3:45 AM  Result Value Ref Range   WBC 6.9 4.0 - 10.5 K/uL   RBC 3.22 (L) 4.22 - 5.81 MIL/uL   Hemoglobin 10.3 (L) 13.0 - 17.0 g/dL   HCT 54.0 (L) 98.1 - 19.1 %   MCV 94.4 80.0 - 100.0 fL   MCH 32.0 26.0 - 34.0 pg   MCHC 33.9 30.0 - 36.0 g/dL   RDW 47.8 29.5 - 62.1 %   Platelets 244 150 - 400 K/uL   nRBC 0.0 0.0 - 0.2 %    Comment: Performed at [facility], [address] [place], [place] [address]  Magnesium      Status: None   Collection Time: 12/14/23  3:45 AM  Result Value Ref Range   Magnesium  2.0 1.7 - 2.4 mg/dL    Comment: Performed at [facility], [address], [place], [place] [address]  Phosphorus     Status: None   Collection Time: 12/14/23  3:45 AM  Result Value Ref Range   Phosphorus 3.9 2.5 - 4.6 mg/dL    Comment: Performed at [facility], [address] [place], [place] [address]  Glucose, capillary     Status: Abnormal   Collection Time: 12/14/23  9:20 AM  Result Value Ref Range   Glucose-Capillary 130 (H) 70 - 99 mg/dL    Comment: Glucose reference range applies only to samples taken after fasting for at least 8 hours.  ECHOCARDIOGRAM COMPLETE     Status: None   Collection Time: 12/14/23 12:07 PM  Result Value Ref Range   Weight 2,719.59 oz   Height 69 in   BP 107/72 mmHg   Ao pk vel 0.87 m/s   AV Area VTI 3.60 cm2   AR max vel 3.42 cm2   AV Mean grad 2.0 mmHg   AV Peak grad 3.0 mmHg   S' Lateral 3.00 cm   AV Area mean vel 3.18 cm2  Area-P 1/2 3.00 cm2   MV VTI 2.88 cm2   Est EF 50 - 55%   Glucose, capillary     Status: Abnormal   Collection Time: 12/14/23 12:34 PM  Result Value Ref Range   Glucose-Capillary 153 (H) 70 - 99 mg/dL    Comment: Glucose reference range applies only to samples taken after fasting for at least 8 hours.  Bladder Scan (Post Void Residual) in  office     Status: None   Collection Time: 12/27/23  3:06 PM  Result Value Ref Range   Scan Result     Radiology ECHOCARDIOGRAM COMPLETE Result Date: 12/14/2023    ECHOCARDIOGRAM REPORT   Patient Name:   Kirk Robinson Date of Exam: 12/14/2023 Medical Rec #:  [id number]       Height:       69.0 in Accession #:    [id number]      Weight:       170.0 lb Date of Birth:  [birth year]       BSA:          1.928 m Patient Age:    67 years        BP:           107/72 mmHg Patient Gender: M               HR:           82 bpm. Exam Location:  [facility] Procedure: 2D Echo, Cardiac Doppler and Color Doppler (Both Spectral and Color            Flow Doppler were utilized during procedure). STAT ECHO Indications:     CHF-acute systolic I50.21  History:         Patient has prior history of Echocardiogram examinations, most                  recent 04/20/2020.  Sonographer:     Broadus Canes Referring Phys:  [id number] Althia Atlas Diagnosing Phys: Belva Boyden MD  Sonographer Comments: Image acquisition challenging due to COPD. IMPRESSIONS  1. Left ventricular ejection fraction, by estimation, is 50 to 55%. The left ventricle has low normal function. The left ventricle has no regional wall motion abnormalities. Left ventricular diastolic parameters are consistent with Grade I diastolic dysfunction (impaired relaxation).  2. Right ventricular systolic function is low normal. The right ventricular size is normal. There is normal pulmonary artery systolic pressure. The estimated right ventricular systolic pressure is 22.5 mmHg.  3. The mitral valve is normal in structure. No evidence of mitral valve regurgitation. No evidence of mitral stenosis.  4. The aortic valve is normal in structure. Aortic valve regurgitation is not visualized. No aortic stenosis is present.  5. The inferior vena cava is normal in size with greater than 50% respiratory variability, suggesting right atrial pressure of 3 mmHg. FINDINGS  Left Ventricle: Left  ventricular ejection fraction, by estimation, is 50 to 55%. The left ventricle has low normal function. The left ventricle has no regional wall motion abnormalities. Strain was performed and the global longitudinal strain is indeterminate. The left ventricular internal cavity size was normal in size. There is no left ventricular hypertrophy. Left ventricular diastolic parameters are consistent with Grade I diastolic dysfunction (impaired relaxation). Right Ventricle: The right ventricular size is normal. No increase in right ventricular wall thickness. Right ventricular systolic function is low normal. There is normal pulmonary artery systolic pressure. The tricuspid regurgitant velocity is 2.09 m/s,  and with  an assumed right atrial pressure of 5 mmHg, the estimated right ventricular systolic pressure is 22.5 mmHg. Left Atrium: Left atrial size was normal in size. Right Atrium: Right atrial size was normal in size. Pericardium: There is no evidence of pericardial effusion. Mitral Valve: The mitral valve is normal in structure. No evidence of mitral valve regurgitation. No evidence of mitral valve stenosis. MV peak gradient, 2.5 mmHg. The mean mitral valve gradient is 1.0 mmHg. Tricuspid Valve: The tricuspid valve is normal in structure. Tricuspid valve regurgitation is not demonstrated. No evidence of tricuspid stenosis. Aortic Valve: The aortic valve is normal in structure. Aortic valve regurgitation is not visualized. No aortic stenosis is present. Aortic valve mean gradient measures 2.0 mmHg. Aortic valve peak gradient measures 3.0 mmHg. Aortic valve area, by VTI measures 3.60 cm. Pulmonic Valve: The pulmonic valve was normal in structure. Pulmonic valve regurgitation is not visualized. No evidence of pulmonic stenosis. Aorta: The aortic root is normal in size and structure. Venous: The inferior vena cava is normal in size with greater than 50% respiratory variability, suggesting right atrial pressure of 3  mmHg. IAS/Shunts: No atrial level shunt detected by color flow Doppler. Additional Comments: 3D was performed not requiring image post processing on an independent workstation and was indeterminate.  LEFT VENTRICLE PLAX 2D LVIDd:         4.30 cm   Diastology LVIDs:         3.00 cm   LV e' medial:    8.59 cm/s LV PW:         1.00 cm   LV E/e' medial:  6.6 LV IVS:        0.90 cm   LV e' lateral:   8.59 cm/s LVOT diam:     2.00 cm   LV E/e' lateral: 6.6 LV SV:         56 LV SV Index:   29 LVOT Area:     3.14 cm  RIGHT VENTRICLE RV Basal diam:  2.90 cm RV Mid diam:    2.50 cm LEFT ATRIUM           Index       RIGHT ATRIUM           Index LA diam:      2.90 cm 1.50 cm/m  RA Area:     12.00 cm LA Vol (A2C): 9.8 ml  5.09 ml/m  RA Volume:   22.20 ml  11.51 ml/m LA Vol (A4C): 15.7 ml 8.14 ml/m  AORTIC VALVE AV Area (Vmax):    3.42 cm AV Area (Vmean):   3.18 cm AV Area (VTI):     3.60 cm AV Vmax:           86.50 cm/s AV Vmean:          59.800 cm/s AV VTI:            0.156 m AV Peak Grad:      3.0 mmHg AV Mean Grad:      2.0 mmHg LVOT Vmax:         94.20 cm/s LVOT Vmean:        60.600 cm/s LVOT VTI:          0.179 m LVOT/AV VTI ratio: 1.15  AORTA Ao Root diam: 3.40 cm MITRAL VALVE               TRICUSPID VALVE MV Area (PHT): 3.00 cm    TR Peak grad:   17.5 mmHg  MV Area VTI:   2.88 cm    TR Vmax:        209.00 cm/s MV Peak grad:  2.5 mmHg MV Mean grad:  1.0 mmHg    SHUNTS MV Vmax:       0.80 m/s    Systemic VTI:  0.18 m MV Vmean:      46.8 cm/s   Systemic Diam: 2.00 cm MV Decel Time: 253 msec MV E velocity: 57.10 cm/s MV A velocity: 73.40 cm/s MV E/A ratio:  0.78 Belva Boyden MD Electronically signed by Belva Boyden MD Signature Date/Time: 12/14/2023/12:19:56 PM    Final     Assessment/Plan  Atherosclerosis of native arteries of extremity with intermittent claudication (HCC) His ABIs today are 1.13 on the right and 1.26 on the left with triphasic waveforms and normal digital pressures and waveforms. His  perfusion is now normal after left iliac intervention previously.  I do not believe his current symptoms are related to any sort of malperfusion.  I would be much more concerned about a neuropathic source or potentially musculoskeletal source.  Asked him to discuss this either with his primary care physician or consider an orthopedic evaluation.  We will continue to follow his perfusion on an annual basis.  COPD (chronic obstructive pulmonary disease) On chronic oxygen therapy.  Followed by pulmonology.   Type 2 diabetes mellitus without complication, without long-term current use of insulin  (HCC) blood glucose control important in reducing the progression of atherosclerotic disease. Also, involved in wound healing. On appropriate medications.     Hyperlipidemia lipid control important in reducing the progression of atherosclerotic disease. Continue statin therapy  Mikki Alexander, MD  01/12/2024 11:50 AM    This note was created with Dragon medical transcription system.  Any errors from dictation are purely unintentional

## 2024-01-16 LAB — VAS US ABI WITH/WO TBI
Left ABI: 1.26
Right ABI: 1.13

## 2024-01-25 ENCOUNTER — Other Ambulatory Visit (INDEPENDENT_AMBULATORY_CARE_PROVIDER_SITE_OTHER): Payer: Self-pay | Admitting: Vascular Surgery

## 2024-04-27 ENCOUNTER — Emergency Department

## 2024-04-27 ENCOUNTER — Other Ambulatory Visit: Payer: Self-pay

## 2024-04-27 ENCOUNTER — Emergency Department
Admission: EM | Admit: 2024-04-27 | Discharge: 2024-04-27 | Disposition: A | Discharge status: Home / Self Care | Attending: Emergency Medicine | Admitting: Emergency Medicine

## 2024-04-27 DIAGNOSIS — N182 Chronic kidney disease, stage 2 (mild): Secondary | ICD-10-CM | POA: Diagnosis not present

## 2024-04-27 DIAGNOSIS — Z8673 Personal history of transient ischemic attack (TIA), and cerebral infarction without residual deficits: Secondary | ICD-10-CM | POA: Insufficient documentation

## 2024-04-27 DIAGNOSIS — W448XXA Other foreign body entering into or through a natural orifice, initial encounter: Secondary | ICD-10-CM | POA: Insufficient documentation

## 2024-04-27 DIAGNOSIS — E1122 Type 2 diabetes mellitus with diabetic chronic kidney disease: Secondary | ICD-10-CM | POA: Diagnosis not present

## 2024-04-27 DIAGNOSIS — T189XXA Foreign body of alimentary tract, part unspecified, initial encounter: Secondary | ICD-10-CM | POA: Insufficient documentation

## 2024-04-27 DIAGNOSIS — J449 Chronic obstructive pulmonary disease, unspecified: Secondary | ICD-10-CM | POA: Diagnosis not present

## 2024-04-27 HISTORY — DX: Rheumatoid arthritis, unspecified: M06.9

## 2024-04-27 LAB — BASIC METABOLIC PANEL WITH GFR
Anion gap: 13 (ref 5–15)
BUN: 40 mg/dL — ABNORMAL HIGH (ref 8–23)
CO2: 26 mmol/L (ref 22–32)
Calcium: 9.3 mg/dL (ref 8.9–10.3)
Chloride: 101 mmol/L (ref 98–111)
Creatinine, Ser: 1.35 mg/dL — ABNORMAL HIGH (ref 0.61–1.24)
GFR, Estimated: 58 mL/min — ABNORMAL LOW (ref >60)
Glucose, Bld: 151 mg/dL — ABNORMAL HIGH (ref 70–99)
Potassium: 4.1 mmol/L (ref 3.5–5.1)
Sodium: 140 mmol/L (ref 135–145)

## 2024-04-27 LAB — CBC
HCT: 37.4 % — ABNORMAL LOW (ref 39.0–52.0)
Hemoglobin: 12.6 g/dL — ABNORMAL LOW (ref 13.0–17.0)
MCH: 30.4 pg (ref 26.0–34.0)
MCHC: 33.7 g/dL (ref 30.0–36.0)
MCV: 90.3 fL (ref 80.0–100.0)
Platelets: 225 K/uL (ref 150–400)
RBC: 4.14 MIL/uL — ABNORMAL LOW (ref 4.22–5.81)
RDW: 13 % (ref 11.5–15.5)
WBC: 8 K/uL (ref 4.0–10.5)
nRBC: 0 % (ref 0.0–0.2)

## 2024-04-27 LAB — TROPONIN I (HIGH SENSITIVITY): Troponin I (High Sensitivity): 5 ng/L (ref <18)

## 2024-04-27 NOTE — ED Provider Notes (Signed)
 [facility] Provider Note    Event Date/Time   First MD Initiated Contact with Patient 04/27/24 1212     (approximate)   History   Swallowed Foreign Body and Chest Pain   HPI  Kirk Robinson is a 68 y.o. male with a history of atrial fibrillation, COPD, pulmonary fibrosis, CKD, type 2 diabetes, stroke, and SVT who presents with concern over for swallowed foreign body.  The patient states that 2 or 3 days ago, he excellently swallowed an iron pill that was still in its blister pack.  His wife states that she prepares the patient's medications for him but had difficulty unwrapping that pill because she currently has a wrist in a splint.  The patient accidentally swallowed the pill in the blister pack without opening it.  He states that he had some throat pain immediately afterwards although this has now resolved.  He denies any abdominal pain.  He has not had any blood in his stools or any abnormal stools, but also has not passed the pill yet.  He states that he also had some chest pain yesterday, sharp, left-sided, nonradiating.  It was worse when he would sit up from lying down.  That pain is now resolved.  I reviewed the past medical records.  The patient was admitted to the hospitalist service in April with SVT, syncope, and COPD exacerbation.   Physical Exam   Triage Vital Signs: ED Triage Vitals  Encounter Vitals Group     BP 04/27/24 1202 107/83     Girls Systolic BP Percentile --      Girls Diastolic BP Percentile --      Boys Systolic BP Percentile --      Boys Diastolic BP Percentile --      Pulse Rate 04/27/24 1202 (!) 102     Resp 04/27/24 1202 16     Temp 04/27/24 1202 98 F (36.7 C)     Temp Source 04/27/24 1202 Oral     SpO2 04/27/24 1202 96 %     Weight 04/27/24 1206 170 lb (77.1 kg)     Height 04/27/24 1206 5' 9 (1.753 m)     Head Circumference --      Peak Flow --      Pain Score 04/27/24 1202 7     Pain Loc --      Pain Education  --      Exclude from Growth Chart --     Most recent vital signs: Vitals:   04/27/24 1205 04/27/24 1215  BP:  111/89  Pulse:  86  Resp:  16  Temp:    SpO2: 96% 99%     General: Alert, comfortable appearing, no distress.  CV:  Good peripheral perfusion.  Resp:  Normal effort. Abd:  Soft and nontender.  No distention.  Other:  No edema.  Oropharynx clear.   ED Results / Procedures / Treatments   Labs (all labs ordered are listed, but only abnormal results are displayed) Labs Reviewed  BASIC METABOLIC PANEL WITH GFR - Abnormal; Notable for the following components:      Result Value   Glucose, Bld 151 (*)    BUN 40 (*)    Creatinine, Ser 1.35 (*)    GFR, Estimated 58 (*)    All other components within normal limits  CBC - Abnormal; Notable for the following components:   RBC 4.14 (*)    Hemoglobin 12.6 (*)    HCT 37.4 (*)  All other components within normal limits  TROPONIN I (HIGH SENSITIVITY)     EKG  ED ECG REPORT I, Waylon Cassis, the attending physician, personally viewed and interpreted this ECG.  Date: 04/27/2024 EKG Time: 1218 Rate: 90 Rhythm: normal sinus rhythm QRS Axis: normal Intervals: LAFB ST/T Wave abnormalities: normal Narrative Interpretation: no evidence of acute ischemia    RADIOLOGY  Chest x-ray: I independently viewed and interpreted the images; there is no focal consolidation or edema  XR abdomen: Nonobstructive bowel gas pattern with no visible foreign body    PROCEDURES:  Critical Care performed: No  Procedures   MEDICATIONS ORDERED IN ED: Medications - No data to display   IMPRESSION / MDM / ASSESSMENT AND PLAN / ED COURSE  I reviewed the triage vital signs and the nursing notes.  68 year old male with PMH as noted above presents for evaluation after accidentally swallowing an iron pill still in his blister pack 2 or 3 days ago, and has not yet passed it.  However he has no abdominal pain or other acute  symptoms.  He instantly had chest pain yesterday, but this has resolved.  Differential diagnosis includes, but is not limited to, swallowed foreign body.  The patient's wife showed me another of the same pills in his blister packet.  It is quite small.  I suspect that it should pass on its own without difficulty.  We will obtain plain films of the chest and abdomen, lab workup to rule out ACS with the chest pain yesterday, and reassess.  Patient's presentation is most consistent with acute complicated illness / injury requiring diagnostic workup.  The patient is on the cardiac monitor to evaluate for evidence of arrhythmia and/or significant heart rate changes.   ----------------------------------------- 2:31 PM on 04/27/2024 -----------------------------------------  X-rays show no acute abnormalities.  BMP and CBC are unremarkable.  Troponin is negative.  Given that the pain was yesterday, there is no indication for a repeat troponin at this time.  I consulted and discussed the case with Dr. Lane from general surgery who agrees that the pill in the blister packet would not be considered a sharp object and would be very low likelihood to cause a perforation or other acute complication.  He recommends continued watchful waiting.  I counseled the patient and family member on the results of the workup and plan of care.  I gave strict return precautions, and they expressed understanding.   FINAL CLINICAL IMPRESSION(S) / ED DIAGNOSES   Final diagnoses:  Swallowed foreign body, initial encounter     Rx / DC Orders   ED Discharge Orders     None        Note:  This document was prepared using Dragon voice recognition software and may include unintentional dictation errors.    Cassis Waylon, MD 04/27/24 1432

## 2024-04-27 NOTE — ED Triage Notes (Addendum)
 Pt arrives via POV from home with c/o swallowing their iron pill in the bubble packaging 2-3 days ago. Pt reports that they have BM daily but doesn't recall passing anything abnormal. Pt's wife reports that she puts pt's meds in a container but recently had wrist surgery and is unable to open the packages for those pills and noted that it was gone and the pt reports not opening the package. Pt reports swallowing was tender right after swallowing the pill but it's better now. Pt is concerned that the ingredients of the package not dissolving. Pt is A&Ox4 during triage, wears 4L [place] chronically.   Pt also reports CP that is on the left side of their chest that started 2 days ago, but hasn't been hurting today. Pain is mostly when they move.

## 2024-04-27 NOTE — Discharge Instructions (Signed)
 The pill in the packet should pass on its own over the next week.  Follow-up with your primary care provider.  Return to the ER immediately for new, worsening, or persistent abdominal pain, vomiting, blood in your stool, inability to have a bowel movement, rectal pain, or any other new or worsening symptoms that concern you.

## 2024-04-30 ENCOUNTER — Ambulatory Visit: Admitting: Urology

## 2024-04-30 DIAGNOSIS — N138 Other obstructive and reflux uropathy: Secondary | ICD-10-CM

## 2024-05-14 ENCOUNTER — Ambulatory Visit: Admitting: Urology

## 2024-05-14 DIAGNOSIS — N138 Other obstructive and reflux uropathy: Secondary | ICD-10-CM

## 2024-05-20 ENCOUNTER — Other Ambulatory Visit (INDEPENDENT_AMBULATORY_CARE_PROVIDER_SITE_OTHER): Payer: Self-pay | Admitting: Nurse Practitioner

## 2024-05-20 MED ORDER — CLOPIDOGREL BISULFATE 75 MG PO TABS: 75.0000 mg | ORAL_TABLET | Freq: Every day | ORAL | 11 refills | Status: AC

## 2024-06-13 ENCOUNTER — Ambulatory Visit (INDEPENDENT_AMBULATORY_CARE_PROVIDER_SITE_OTHER): Admitting: Urology

## 2024-06-13 VITALS — BP 120/82 | HR 99 | Wt 172.0 lb

## 2024-06-13 DIAGNOSIS — N138 Other obstructive and reflux uropathy: Secondary | ICD-10-CM | POA: Diagnosis not present

## 2024-06-13 DIAGNOSIS — N401 Enlarged prostate with lower urinary tract symptoms: Secondary | ICD-10-CM | POA: Diagnosis not present

## 2024-06-13 LAB — BLADDER SCAN AMB NON-IMAGING

## 2024-06-13 MED ORDER — TAMSULOSIN HCL 0.4 MG PO CAPS: 0.8000 mg | ORAL_CAPSULE | Freq: Every day | ORAL | 11 refills | Status: AC

## 2024-06-13 NOTE — Patient Instructions (Signed)
 Understanding BPH (Benign Prostatic Hyperplasia)  What is BPH? BPH stands for Benign Prostatic Hyperplasia. It is a common condition that affects many men as they get older. It happens when the prostate gland, which is part of the male reproductive system, gets bigger. This can cause problems when urinating.  What Are the Symptoms?    Feeling like you need to urinate often, especially at night Trouble starting urinating or weak stream Sudden urge to urinate Feeling that your bladder has not emptied completely  Why Does It Happen? As men age, the prostate naturally gets bigger. This can press against the tube that carries urine out of the bladder, making it hard to urinate.  How Is BPH Treated? There are different ways to treat BPH, depending on how severe the symptoms are:  Lifestyle Changes    Reduce drinks before bedtime Avoid diet/flavored drinks, caffeine, and alcohol Practice double voiding (urinating twice during a trip to the bathroom)  Medications    Alpha-blockers (like tamsulosin /flomax ) to relax the muscles in the prostate and bladder neck 5-alpha reductase inhibitors (like finasteride) to shrink the prostate  If your symptoms do not respond to medications, you have side effect from medications, or would just like to get off of medications, there are multiple surgical options to improve urination.  Procedures and Surgery(Urolift and HoLEP)    UroLift: ~15-minute outpatient procedure performed in the operating room where the prostate is pinned open with small clips.  Patient's discharge the same day, rarely needed catheter, no risk of urinary leakage or erection problems.  Very low risk of bleeding or infection.  It is common to have some temporary irritative symptoms for 1 to 2 weeks after surgery including burning with urination, urgency, frequency.  This is a good option for small prostate or patients with mild to moderate symptoms, but may not be the best long-term  treatment if you have a very large prostate, have urinary retention requiring a catheter, or have severe symptoms  HoLEP(holmium laser enucleation of the prostate): ~1 hour outpatient procedure performed in the operating room where a laser is used to hollow out a large channel through the prostate.  The procedure was performed through the urethra, so there are no cuts or incisions.  You will need a catheter for 2 days afterwards.  Low risk of bleeding, infection, or damage to the bladder or ureters.  This is the gold standard BPH procedure.  Common to have urinary urgency, frequency, and leakage temporarily afterwards, but <2% risk of long-term incontinence.  This is the best treatment if you have a very large prostate, retention requiring a Foley catheter, or have severe symptoms.

## 2024-06-13 NOTE — Progress Notes (Signed)
   06/13/2024 10:52 AM   Buddy Schnitker 01-Oct-1955 [id number]  Reason for visit: Follow up BPH and retention  History: Initial visit May 2025 for voiding trial Initial Foley had been placed in the ER for reported bladder distention on CT, but no documented bladder scan, no hydronephrosis seen on CT and prostate measured 19g.  PSA was normal at 0.72 August 2024 Numerous comorbidities including COPD on oxygen, interstitial lung disease, A-fib, diabetes Passed voiding trial May 2025 with Flomax   Physical Exam: BP 120/82 (BP Location: Left Arm, Patient Position: Sitting, Cuff Size: Normal)   Pulse 99   Wt 172 lb (78 kg)   SpO2 92%   BMI 25.40 kg/m    Today: PVR today normal at 65ml IPSS score 4, quality-of-life mostly satisfied, no significant urinary complaints Denies any gross hematuria, dysuria, or UTIs  Plan:   BPH with history of retention: Continue max dose Flomax , refilled.  Not a candidate for finasteride  with prostate <40g.  With his multiple comorbidities would avoid surgery unless persistent Foley dependent retention despite voiding trials.  RTC 1 year PVR   Redell JAYSON Burnet, MD  [facility] [address] [place], [place] [address] [phone]

## 2024-07-10 NOTE — Progress Notes (Signed)
 "  Follow-up, COPD, Pulmonary Fibrosis, and ILD   History of Present Illness: Kirk Robinson is a 68 y.o. male that presents to clinic for routine check up. He is having more difficulty with his breathing. Hx  of rheumatoid arthritis, pulmonary  fibrosis, ex smoker, copd, on oxygen. Anxiety on chronic valium . No new rashes, nodes, synovitis not present. No fever or chills. No congestion today ( some days he does). No advancing edema or calf pain. Waiting on ohtuvayre  to be delivered. . Discussed switching esbiet to jaycacd. Family are in agreement.   In addition, denies chest pain, gerd or rhinitis. No nasal ulcerations.     Current Medications:  Current Outpatient Medications  Medication Sig Dispense Refill   ACCU-CHEK GUIDE GLUCOSE METER Misc as directed 1 each 0   albuterol  (PROVENTIL ) 2.5 mg /3 mL (0.083 %) nebulizer solution Take 3 mLs (2.5 mg total) by nebulization every 6 (six) hours as needed for Wheezing Inhale 1 vial using nebulizer every six hours as needed 75 mL 6   albuterol  MDI, PROVENTIL , VENTOLIN , PROAIR , HFA 90 mcg/actuation inhaler inhale 2 puffs by mouth every 6 hours as needed for wheeze 18 each 5   ALCOHOL PREP PADS PadM Check sugar three times daily dx: E11.9 300 each 3   ARIPiprazole  (ABILIFY ) 5 MG tablet TAKE 1 TABLET BY MOUTH EVERY DAY 90 tablet 4   aspirin  81 MG EC tablet Take 81 mg by mouth once daily    30 tablet 0   atorvastatin  (LIPITOR) 20 MG tablet TAKE 1 TABLET BY MOUTH EVERY DAY 90 tablet 4   blood glucose diagnostic (ONETOUCH VERIO TEST STRIPS) test strip once daily Check sugar once daily  Dx: DMII non-insulin  dependent without complications 100 strip 4   blood glucose meter kit as directed 1 each 0   budesonide -glycopyrrolate -formoterol  (BREZTRI  AEROSPHERE) 160-9-4.8 mcg/actuation inhaler Inhale 2 inhalations into the lungs 2 (two) times daily 1 g 12   cholecalciferol (VITAMIN D3) 1,250 mcg (50,000 unit) capsule Take 50,000 Units by mouth once a  week     clopidogreL  (PLAVIX ) 75 mg tablet Take 75 mg by mouth once daily     diazePAM  (VALIUM ) 5 MG tablet Take 1 tablet (5 mg total) by mouth every 12 (twelve) hours 60 tablet 2   docusate sodium  (COLACE ORAL) Take by mouth 3 (three) times daily     ergocalciferol , vitamin D2, 1,250 mcg (50,000 unit) capsule Take 1 capsule (50,000 Units total) by mouth once a week 12 capsule 3   ferrous sulfate (SLOW FE ORAL) Take by mouth 3 (three) times daily     ibandronate (BONIVA) 150 mg tablet TAKE 1 TABLET (150 MG TOTAL) BY MOUTH EVERY 30 (THIRTY) DAYS TAKE ON AN EMPTY STOMACH WITH A FULL GLASS OF WATER. AVOID MINERAL OR WELL WATER. DO NOT EAT OR TAKE OTHER MEDICATIONS FOR AT LEAST 30 MINUTES AFTER DOSE. SIT OR STAND FOR AT LEAST 30 MINUTES AFTER DOSE. 3 tablet 4   ipratropium (ATROVENT ) 21 mcg (0.03 %) nasal spray Place 1 spray into both nostrils 2 (two) times daily as needed for Rhinitis 30 mL 1   ipratropium-albuteroL  (DUO-NEB) nebulizer solution Take 3 mLs by nebulization 4 (four) times daily for 360 days 90 mL 1   lancets Use 1 each once daily Use as instructed. 100 each 3   lancets 1 each once daily Dx: DMII non-insulin  without complications.  E11.9; ONE TOUCH 100 each 3   levothyroxine  (SYNTHROID ) 100 MCG tablet TAKE 1 TABLET (100  MCG TOTAL) BY MOUTH ONCE DAILY TAKE ON AN EMPTY STOMACH WITH A GLASS OF WATER AT LEAST 30-60 MINUTES BEFORE BREAKFAST. 100 tablet 3   menthol (ICY HOT ROLL TOP) Apply topically     metFORMIN  (GLUCOPHAGE ) 1000 MG tablet Take 1 tablet (1,000 mg total) by mouth 2 (two) times daily with meals 180 tablet 4   metoprolol  SUCCinate (TOPROL -XL) 25 MG XL tablet TAKE 1/2 TABLET BY MOUTH DAILY 45 tablet 2   midodrine (PROAMATINE) 2.5 MG tablet Take 1 tablet (2.5 mg total) by mouth 2 (two) times daily 180 tablet 3   mirtazapine  (REMERON ) 30 MG tablet Take 1 tablet (30 mg total) by mouth at bedtime     morphine  (MS CONTIN ) 30 MG ER tablet Take 30 mg by mouth 2 (two)  times daily     naloxone  (NARCAN ) 4 mg/actuation nasal spray Place 4 mg into one nostril once as needed     OXYGEN-AIR DELIVERY SYSTEMS MISC Use 3 L.     pantoprazole  (PROTONIX ) 40 MG DR tablet TAKE 1 TABLET BY MOUTH 2 TIMES DAILY BEFORE MEALS. 200 tablet 3   pirfenidone (ESBRIET) 267 mg Tab Take 801 mg by mouth 3 (three) times daily with meals 270 tablet 11   predniSONE  (DELTASONE ) 10 MG tablet TAKE 1 TABLET BY MOUTH DAILY 90 tablet 0   rOPINIRole  (REQUIP ) 0.5 MG immediate release tablet TAKE 1 TABLET BY MOUTH EVERYDAY AT BEDTIME 90 tablet 4   tamsulosin  (FLOMAX ) 0.4 mg capsule Take 1 capsule (0.4 mg total) by mouth 2 (two) times daily Take 30 minutes after same meal each day. 180 capsule 3   venlafaxine  (EFFEXOR -XR) 150 MG XR capsule TAKE 1 CAPSULE (150 MG TOTAL) BY MOUTH ONCE DAILY. 90 capsule 4   No current facility-administered medications for this visit.    Problem List:  Patient Active Problem List  Diagnosis   Atrial fibrillation (CMS/HHS-HCC)   Chronic pain syndrome   COPD (chronic obstructive pulmonary disease) (CMS/HHS-HCC)   GERD (gastroesophageal reflux disease)   History of colon polyps   Hypothyroidism following radioiodine therapy   Obstructed VP shunt ()   Osteoporosis   Peripheral vascular disease ()   Pulmonary fibrosis (CMS/HHS-HCC)   Pure hypercholesterolemia   Tobacco abuse   Type 2 diabetes mellitus with stage 3a chronic kidney disease, without long-term current use of insulin  (CMS/HHS-HCC)   Restless leg syndrome   Psychophysiological insomnia   Chronic respiratory failure with hypoxia (CMS/HHS-HCC)   Atherosclerosis of native arteries of extremity with intermittent claudication   Recurrent major depressive disorder, in partial remission ()   Adenomatous polyp of colon   On supplemental oxygen therapy   SVT (supraventricular tachycardia) (HHS-HCC)   Shortness of breath   Rheumatoid arthritis involving multiple sites with  positive rheumatoid factor (CMS/HHS-HCC)    Allergies:  Ativan  [lorazepam ], Erythromycin, Fluticasone propion-salmeterol, Iodinated contrast media, Metrizamide, Doxycycline, Doxycycline hyclate, Keflex [cephalexin], Plaquenil [hydroxychloroquine], Propoxyphene n-acetaminophen , and Sulfamethoxazole-trimethoprim  History:  Past Medical History:  Diagnosis Date   Anxiety    Asthma, unspecified asthma severity, unspecified whether complicated, unspecified whether persistent (HHS-HCC)    Chronic pain    Chronic respiratory failure (CMS/HHS-HCC)    COPD (chronic obstructive pulmonary disease) (CMS/HHS-HCC)    end-stage   Depression    Dermatitis    Diabetes mellitus type 2, uncomplicated (CMS/HHS-HCC)    GERD (gastroesophageal reflux disease)    Graves' disease    History of cerebrovascular accident    History of pneumonia    community-acquired pneumonia  History of pulmonary fibrosis    Hyperlipidemia    Osteoarthritis    Oxygen desaturation    Pulmonary fibrosis (CMS/HHS-HCC)    Rheumatoid arthritis involving multiple sites with positive rheumatoid factor (CMS/HHS-HCC) 04/02/2024   Tobacco abuse     Past Surgical History:  Procedure Laterality Date   CATARACT EXTRACTION Bilateral 2025   08/2023 Left Eye, 09/2023 Right Eye   Brain ventricular shunt placement     BRONCHOSCOPY     ENDOVASCULAR TRANSLUMINAL BALLOON ANGIOPLASTY ILIAC ARTERY     Eye surgery Left    strabismus surgery several times   Ganglion cyst removal     KNEE ARTHROSCOPY     Open lung biopsy      Family History  Problem Relation Name Age of Onset   Coronary Artery Disease (Blocked arteries around heart) Mother     Diabetes type II Mother     Lung cancer Father Elgin Kitty Vanhouten        heavy smoker   Cancer Father Elgin Kitty Propes    COPD Father Elgin Kitty Chern    Emphysema Father Elgin Kitty Roedl    Coronary Artery Disease (Blocked arteries around  heart) Brother Aleene Pollen    Pancreatic cancer Brother Aleene Sames    Asthma Brother Aleene Pollen    Prostate cancer Brother     Coronary Artery Disease (Blocked arteries around heart) Brother     Osteoarthritis Brother         knee replacement   Coronary Artery Disease (Blocked arteries around heart) Brother     Diabetes Other +FH    Heart disease Other +FH    Cancer Brother Richard Monger    Autoimmune disease Brother Elgin Salmon Dawn      reports that he quit smoking about 4 years ago. His smoking use included cigarettes. He started smoking about 49 years ago. He has a 45 pack-year smoking history. He has been exposed to tobacco smoke. He has quit using smokeless tobacco.  His smokeless tobacco use included snuff and chew. He reports that he does not drink alcohol and does not use drugs.  Review of System: As per above. All systems were reviewed with him in totality. No other cardiopulmonary symptoms. No nausea, vomiting, diarrhea, blood in stools,dysuria or flank pain. No GERD, dysphagia, choking spells, polyphagia, polydipsia, heat or cold intolerance, rashes, lymph nodes, bleeding, seizures, TIAs, passing out spells, suicidaly ideation, proximal muscle tenderness, joint effusions. No strong history to suggest sleep apnea.  Physical Exam: BP 107/76   Pulse 104   Ht 175.3 cm (5' 9)   Wt 78 kg (172 lb) Comment: Patient unable to weigh, in wheelchair  SpO2 96% Comment: 4L POC  BMI 25.40 kg/m  78 kg (172 lb) 96% General:  NAD. Able to speak in complete sentences without cough or dyspnea HEENT: Normocephalic, nontraumatic. Extraocular movements intact NECK: Supple. No JVD, nodes, thryomegaly CV: RRR no murmurs, gallops, rubs PULM: Normal respiratory effort, Clear to auscultation bilaterally without wheezing or crackles EXTREMITIESs: No significant edema, cyanosis or Homans'signs SKIN: Fair turgor. No rashes LYMPHATIC: No nodes NEURO: No gross  deficits PSYCH: Appropriate affect   Diagnostics:  ti-MPO Antibodies - [facility] <0.2       Anti-PR3 Antibodies - [facility] <0.2     Gytoplasmic (C-ANCA) - [facility] <1:20 <1:20 <redacted file path>   Perinuclear (P-ANCA) - [facility] <1:20 <1:20 <redacted file path> CM  Comment: The presence of positive fluorescence exhibiting P-ANCA or C-ANCA patterns alone is not specific  for the diagnosis of Wegener's Granulomatosis (WG) or microscopic polyangiitis. Decisions about treatment should not be based solely on ANCA IFA results.  The [employer] Consensus recommends follow up testing of positive sera with both PR-3 and MPO-ANCA enzyme immunoassays. As many as 5% serum samples are positive only by EIA. Ref. AM J Clin Pathol 1999;111:507-513.   Atypical pANCA - [facility] <1:20      Sedimentation Rate-Automated 24 High  34 <redacted file path> High <redacted file path>  37 <redacted file path> High <redacted file path>     RA Latex Turbid. - [facility] 16.4 High     SPIROMETRY: FVC was 2.23 L, 56 % of predicted FEV1 was 1.78 L, 58 % of predicted ( dropped fro 66 %) FEV1/FVC ratio was  103 % of predicted FEF 25-75% liters per second was 70 % of predicted   LUNG VOLUMES: TLC was 58 % of predicted ( down from 75 %) RV was 71 % of predicted   DIFFUSION CAPACITY: DLCO was 39 % of predicted ( 42 % last checked) DLCO/VA was 71 % of predicted     Good patient effort with fair repeatability.   Spirometry shoed moderate restriction, dropping TLC is moderately decreased. DLCO is severely decreased      Interpreting Physician Dr. Theotis   IMPRESSIONS  4/25  1. Left ventricular ejection fraction, by estimation, is 50 to 55%. The left ventricle has low normal function. The left ventricle has no regional wall motion abnormalities. Left ventricular diastolic parameters are consistent with Grade I diastolic  dysfunction (impaired relaxation).   2. Right ventricular systolic function is  low normal. The right ventricular size is normal. There is normal pulmonary artery systolic pressure. The estimated right ventricular systolic pressure is 22.5 mmHg.   3. The mitral valve is normal in structure. No evidence of mitral valve regurgitation. No evidence of mitral stenosis.   4. The aortic valve is normal in structure. Aortic valve regurgitation is not visualized. No aortic stenosis is present.   5. The inferior vena cava is normal in size with greater than 50% respiratory variability, suggesting right atrial pressure of 3 mmHg.    Angio Convert Enzyme - [facility] 14 - 82 U/L 31    RA Latex Turbid. - [facility] 0.0 - 13.9 IU/mL 11.2     A.Fumigatus #1 Abs - [facility] Negative Negative   Micropolyspora faeni, IgG - [facility] Negative Negative  Comment: **Effective March 11, 2019 this test will be made non-orderable.**   No replacement number is available. For Further information,   please contact your local [facility] Representative.   Thermoactinomyces vulgaris, IgG - [facility] Negative Negative  Comment: **Effective March 11, 2019 this test will be made non-orderable.**   No replacement number is available. For Further information,   please contact your local [facility] Representative.   A. Pullulans Abs - [facility] Negative Negative  Comment: **Effective March 11, 2019 this test will be made non-orderable.**   No replacement number is available. For Further information,   please contact your local [facility] Representative.   Thermoact. Saccharii - [facility] Negative Negative  Comment: **Effective March 11, 2019 this test will be made non-orderable.**   No replacement number is available. For Further information,   please contact your local [facility] Representative.   Pigeon Serum Abs - [facility] Negative Negative  Comment: **Effective March 11, 2019 this test will be made non-orderable.**   No replacement number is available. For Further information,    QuantiFERON TB1 Ag Value -  [facility] IU/mL  0.02     QuantiFERON TB2 Ag Value - [facility] IU/mL 0.02   QuantiFERON Nil Value - [facility] IU/mL 0.03   QuantiFERON Mitogen Value - [facility] IU/mL 0.56   QuantiFERON TB Gold - [facility] Negative Negative    Antimyeloperoxidase (MPO) Abs - [facility] 0.0 - 9.0 U/mL <9.0     Antiproteinase 3 (PR-3) Abs - [facility] 0.0 - 3.5 U/mL <3.5   Gytoplasmic (C-ANCA) - [facility] Neg:<1:20 titer <1:20   Perinuclear (P-ANCA) - [facility] Neg:<1:20 titer <1:20  Comment: The presence of positive fluorescence exhibiting P-ANCA or C-ANCA patterns alone is not specific for the diagnosis of Wegener's Granulomatosis (WG) or microscopic polyangiitis. Decisions about treatment should not be based solely on ANCA IFA results.  The [employer] Consensus recommends follow up testing of positive sera with both PR-3 and MPO-ANCA enzyme immunoassays. As many as 5% serum samples are positive only by EIA. Ref. AM J Clin Pathol 1999;111:507-513.   Atypical pANCA - [facility] Neg:<1:20 titer <1:20    Impression: Copd, off cigarettes.  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  Chest CT 4/23 showed no progression. Flow rates, TLC ,  DLCO  trending down. He has rheumatoid arthritis as a contributor.  Switch esbriet to jascayd  Potable oxygen at 4-5 liters on imogen Repeat pfts in 4 months   Anxiety, stable valium   ( not abusing), refilled     "

## 2024-08-31 ENCOUNTER — Inpatient Hospital Stay
Admission: EM | Admit: 2024-08-31 | Discharge: 2024-09-04 | Disposition: A | Discharge status: Home / Self Care | Attending: Hospitalist | Admitting: Hospitalist

## 2024-08-31 ENCOUNTER — Emergency Department

## 2024-08-31 ENCOUNTER — Other Ambulatory Visit: Payer: Self-pay

## 2024-08-31 DIAGNOSIS — F32A Depression, unspecified: Secondary | ICD-10-CM | POA: Diagnosis present

## 2024-08-31 DIAGNOSIS — G919 Hydrocephalus, unspecified: Secondary | ICD-10-CM | POA: Diagnosis present

## 2024-08-31 DIAGNOSIS — Z1152 Encounter for screening for COVID-19: Secondary | ICD-10-CM | POA: Diagnosis not present

## 2024-08-31 DIAGNOSIS — E039 Hypothyroidism, unspecified: Secondary | ICD-10-CM | POA: Diagnosis present

## 2024-08-31 DIAGNOSIS — J9621 Acute and chronic respiratory failure with hypoxia: Secondary | ICD-10-CM | POA: Diagnosis present

## 2024-08-31 DIAGNOSIS — J441 Chronic obstructive pulmonary disease with (acute) exacerbation: Secondary | ICD-10-CM | POA: Diagnosis present

## 2024-08-31 DIAGNOSIS — E860 Dehydration: Secondary | ICD-10-CM | POA: Diagnosis present

## 2024-08-31 DIAGNOSIS — E1165 Type 2 diabetes mellitus with hyperglycemia: Secondary | ICD-10-CM | POA: Diagnosis present

## 2024-08-31 DIAGNOSIS — Z823 Family history of stroke: Secondary | ICD-10-CM

## 2024-08-31 DIAGNOSIS — F419 Anxiety disorder, unspecified: Secondary | ICD-10-CM | POA: Diagnosis present

## 2024-08-31 DIAGNOSIS — I5032 Chronic diastolic (congestive) heart failure: Secondary | ICD-10-CM | POA: Diagnosis present

## 2024-08-31 DIAGNOSIS — N1831 Chronic kidney disease, stage 3a: Secondary | ICD-10-CM | POA: Diagnosis present

## 2024-08-31 DIAGNOSIS — M069 Rheumatoid arthritis, unspecified: Secondary | ICD-10-CM | POA: Diagnosis present

## 2024-08-31 DIAGNOSIS — Z888 Allergy status to other drugs, medicaments and biological substances status: Secondary | ICD-10-CM

## 2024-08-31 DIAGNOSIS — E7800 Pure hypercholesterolemia, unspecified: Secondary | ICD-10-CM | POA: Diagnosis present

## 2024-08-31 DIAGNOSIS — Z885 Allergy status to narcotic agent status: Secondary | ICD-10-CM

## 2024-08-31 DIAGNOSIS — N289 Disorder of kidney and ureter, unspecified: Secondary | ICD-10-CM | POA: Diagnosis present

## 2024-08-31 DIAGNOSIS — Z7902 Long term (current) use of antithrombotics/antiplatelets: Secondary | ICD-10-CM

## 2024-08-31 DIAGNOSIS — Z9981 Dependence on supplemental oxygen: Secondary | ICD-10-CM

## 2024-08-31 DIAGNOSIS — I6990 Unspecified sequelae of unspecified cerebrovascular disease: Secondary | ICD-10-CM

## 2024-08-31 DIAGNOSIS — E8721 Acute metabolic acidosis: Secondary | ICD-10-CM | POA: Diagnosis present

## 2024-08-31 DIAGNOSIS — I482 Chronic atrial fibrillation, unspecified: Secondary | ICD-10-CM | POA: Diagnosis present

## 2024-08-31 DIAGNOSIS — R531 Weakness: Secondary | ICD-10-CM

## 2024-08-31 DIAGNOSIS — N401 Enlarged prostate with lower urinary tract symptoms: Secondary | ICD-10-CM | POA: Diagnosis present

## 2024-08-31 DIAGNOSIS — Z860100 Personal history of colon polyps, unspecified: Secondary | ICD-10-CM

## 2024-08-31 DIAGNOSIS — R0902 Hypoxemia: Principal | ICD-10-CM

## 2024-08-31 DIAGNOSIS — Z882 Allergy status to sulfonamides status: Secondary | ICD-10-CM

## 2024-08-31 DIAGNOSIS — Z833 Family history of diabetes mellitus: Secondary | ICD-10-CM

## 2024-08-31 DIAGNOSIS — J9622 Acute and chronic respiratory failure with hypercapnia: Secondary | ICD-10-CM | POA: Diagnosis present

## 2024-08-31 DIAGNOSIS — Z7982 Long term (current) use of aspirin: Secondary | ICD-10-CM

## 2024-08-31 DIAGNOSIS — Z881 Allergy status to other antibiotic agents status: Secondary | ICD-10-CM

## 2024-08-31 DIAGNOSIS — I5A Non-ischemic myocardial injury (non-traumatic): Secondary | ICD-10-CM | POA: Diagnosis present

## 2024-08-31 DIAGNOSIS — T380X5A Adverse effect of glucocorticoids and synthetic analogues, initial encounter: Secondary | ICD-10-CM | POA: Diagnosis present

## 2024-08-31 DIAGNOSIS — R338 Other retention of urine: Secondary | ICD-10-CM | POA: Diagnosis not present

## 2024-08-31 DIAGNOSIS — E785 Hyperlipidemia, unspecified: Secondary | ICD-10-CM | POA: Diagnosis present

## 2024-08-31 DIAGNOSIS — K219 Gastro-esophageal reflux disease without esophagitis: Secondary | ICD-10-CM | POA: Diagnosis present

## 2024-08-31 DIAGNOSIS — I639 Cerebral infarction, unspecified: Secondary | ICD-10-CM | POA: Diagnosis present

## 2024-08-31 DIAGNOSIS — I2489 Other forms of acute ischemic heart disease: Secondary | ICD-10-CM | POA: Diagnosis present

## 2024-08-31 DIAGNOSIS — N179 Acute kidney failure, unspecified: Secondary | ICD-10-CM | POA: Diagnosis present

## 2024-08-31 DIAGNOSIS — Z87891 Personal history of nicotine dependence: Secondary | ICD-10-CM

## 2024-08-31 DIAGNOSIS — J8410 Pulmonary fibrosis, unspecified: Secondary | ICD-10-CM | POA: Diagnosis present

## 2024-08-31 DIAGNOSIS — Z982 Presence of cerebrospinal fluid drainage device: Secondary | ICD-10-CM

## 2024-08-31 DIAGNOSIS — E875 Hyperkalemia: Secondary | ICD-10-CM | POA: Diagnosis not present

## 2024-08-31 DIAGNOSIS — E1151 Type 2 diabetes mellitus with diabetic peripheral angiopathy without gangrene: Secondary | ICD-10-CM | POA: Diagnosis present

## 2024-08-31 DIAGNOSIS — Z8249 Family history of ischemic heart disease and other diseases of the circulatory system: Secondary | ICD-10-CM

## 2024-08-31 DIAGNOSIS — Z800 Family history of malignant neoplasm of digestive organs: Secondary | ICD-10-CM

## 2024-08-31 DIAGNOSIS — M810 Age-related osteoporosis without current pathological fracture: Secondary | ICD-10-CM | POA: Diagnosis present

## 2024-08-31 DIAGNOSIS — E1129 Type 2 diabetes mellitus with other diabetic kidney complication: Secondary | ICD-10-CM | POA: Diagnosis present

## 2024-08-31 DIAGNOSIS — Z7984 Long term (current) use of oral hypoglycemic drugs: Secondary | ICD-10-CM

## 2024-08-31 DIAGNOSIS — G894 Chronic pain syndrome: Secondary | ICD-10-CM | POA: Diagnosis not present

## 2024-08-31 DIAGNOSIS — Z8042 Family history of malignant neoplasm of prostate: Secondary | ICD-10-CM

## 2024-08-31 DIAGNOSIS — E1122 Type 2 diabetes mellitus with diabetic chronic kidney disease: Secondary | ICD-10-CM | POA: Diagnosis present

## 2024-08-31 DIAGNOSIS — Z79899 Other long term (current) drug therapy: Secondary | ICD-10-CM

## 2024-08-31 DIAGNOSIS — Z79890 Hormone replacement therapy (postmenopausal): Secondary | ICD-10-CM

## 2024-08-31 DIAGNOSIS — Z809 Family history of malignant neoplasm, unspecified: Secondary | ICD-10-CM

## 2024-08-31 LAB — CBC
HCT: 38.1 % — ABNORMAL LOW (ref 39.0–52.0)
Hemoglobin: 12.5 g/dL — ABNORMAL LOW (ref 13.0–17.0)
MCH: 30.5 pg (ref 26.0–34.0)
MCHC: 32.8 g/dL (ref 30.0–36.0)
MCV: 92.9 fL (ref 80.0–100.0)
Platelets: 285 K/uL (ref 150–400)
RBC: 4.1 MIL/uL — ABNORMAL LOW (ref 4.22–5.81)
RDW: 13.2 % (ref 11.5–15.5)
WBC: 10.3 K/uL (ref 4.0–10.5)
nRBC: 0 % (ref 0.0–0.2)

## 2024-08-31 LAB — BASIC METABOLIC PANEL WITH GFR
Anion gap: 20 — ABNORMAL HIGH (ref 5–15)
BUN: 33 mg/dL — ABNORMAL HIGH (ref 8–23)
CO2: 20 mmol/L — ABNORMAL LOW (ref 22–32)
Calcium: 9.4 mg/dL (ref 8.9–10.3)
Chloride: 98 mmol/L (ref 98–111)
Creatinine, Ser: 1.88 mg/dL — ABNORMAL HIGH (ref 0.61–1.24)
GFR, Estimated: 39 mL/min — ABNORMAL LOW
Glucose, Bld: 175 mg/dL — ABNORMAL HIGH (ref 70–99)
Potassium: 4.7 mmol/L (ref 3.5–5.1)
Sodium: 137 mmol/L (ref 135–145)

## 2024-08-31 LAB — PRO BRAIN NATRIURETIC PEPTIDE: Pro Brain Natriuretic Peptide: 278 pg/mL

## 2024-08-31 LAB — RESP PANEL BY RT-PCR (RSV, FLU A&B, COVID)  RVPGX2
Influenza A by PCR: NEGATIVE
Influenza B by PCR: NEGATIVE
Resp Syncytial Virus by PCR: NEGATIVE
SARS Coronavirus 2 by RT PCR: NEGATIVE

## 2024-08-31 LAB — TROPONIN T, HIGH SENSITIVITY
Troponin T High Sensitivity: 26 ng/L — ABNORMAL HIGH (ref 0–19)
Troponin T High Sensitivity: 35 ng/L — ABNORMAL HIGH (ref 0–19)

## 2024-08-31 LAB — PROCALCITONIN: Procalcitonin: 0.11 ng/mL

## 2024-08-31 MED ORDER — SODIUM CHLORIDE 0.9 % IV BOLUS
500.0000 mL | Freq: Once | INTRAVENOUS | Status: AC
  Administered 2024-08-31: 500 mL via INTRAVENOUS

## 2024-08-31 MED ORDER — IPRATROPIUM-ALBUTEROL 0.5-2.5 (3) MG/3ML IN SOLN
3.0000 mL | Freq: Once | RESPIRATORY_TRACT | Status: AC
  Administered 2024-08-31: 3 mL via RESPIRATORY_TRACT
  Filled 2024-08-31: qty 3

## 2024-08-31 MED ORDER — METHYLPREDNISOLONE SODIUM SUCC 125 MG IJ SOLR
125.0000 mg | Freq: Once | INTRAMUSCULAR | Status: AC
  Administered 2024-08-31: 125 mg via INTRAVENOUS
  Filled 2024-08-31: qty 2

## 2024-08-31 MED ORDER — DIAZEPAM 5 MG PO TABS
5.0000 mg | ORAL_TABLET | Freq: Once | ORAL | Status: AC
  Administered 2024-08-31: 5 mg via ORAL
  Filled 2024-08-31: qty 1

## 2024-08-31 NOTE — H&P (Signed)
 " History and Physical    Kirk Robinson FMW:[id number] DOB: 11/06/1955 DOA: 08/31/2024  Referring MD/NP/PA:   PCP: Claudene Rayfield HERO, MD   Patient coming from:  The patient is coming from home.     Chief Complaint: SOB  HPI: Kirk Robinson is a 69 y.o. 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  Patient states that his SOB has been progressively worsening the past 2 days.  Patient has dry cough, no chest pain, fever or chills.  He has generalized weakness.  He has nausea, no vomiting, diarrhea or abdominal pain.  He has poor appetite and decreased oral intake.  He reports decreased urine output, no dysuria or burning with urination.  Pt is on 4 L of oxygen at baseline, and found to have moderate acute respiratory distress, with difficulty speaking in full sentence, and with increased oxygen requirement to 6L of oxygen in the ED.  Data reviewed independently and ED Course: pt was found to have WBC 10.3, BNP  278, worsening renal function, negative PCR for COVID, flu and RSV, troponin 26, --> 35, temperature normal, blood pressure 108/72, heart rate 103 --> 83, RR 18, oxygen saturation 100% on 6 L oxygen currently, chest x-ray showed chronic interstitial lung disease without infiltration.  Patient is admitted to PCU as inpatient.  Renal ultrasound: No hydronephrosis, no mass, no urinary retention.   EKG: I have personally reviewed.  Sinus rhythm, heart rate 111, LAD, poor R wave pression.   Review of Systems:   General: no fevers, chills, no body weight gain, has poor appetite, has fatigue HEENT: no blurry vision, hearing changes or sore throat Respiratory: has dyspnea, coughing, wheezing CV: no chest pain, no  palpitations GI: has nausea, no vomiting, abdominal pain, diarrhea, constipation GU: no dysuria, burning on urination, increased urinary frequency, hematuria. Has decreased urine output. Ext: no leg edema Neuro: no unilateral weakness, numbness, or tingling, no vision change or hearing loss Skin: no rash, no skin tear. MSK: No muscle spasm, no deformity, no limitation of range of movement in spin Heme: No easy bruising.  Travel history: No recent long distant travel.   Allergy: Allergies[1]  Past Medical History:  Diagnosis Date   Alcohol use 03/16/2007   patient risk factors   Allergic rhinitis, cause unspecified    Altered mental status    Anxiety state, unspecified    Cervicalgia 09/17/2007   COPD (chronic obstructive pulmonary disease) (HCC)    Diabetes mellitus without complication (HCC)    Diplopia 12/21/2007   Esophageal reflux    Gastritis 05/13/2009   HCAP (healthcare-associated pneumonia) 08/12/2014   Hematuria 03/16/2007   Hyperthyroidism    graves disease   Iodine  hypothyroidism    Memory loss    NSIP (nonspecific interstitial pneumonia) (HCC)    on azathioprine    Osteoporosis, unspecified    bone density per Morivati in 2012   Other abnormal glucose 04/08/2008   Other diseases of lung, not elsewhere classified 01/23/2009   s/p Fleming/pulmonology consult; intolerant  to Advair   Peripheral vascular disease, unspecified 12/24/2009   Personal history of colonic polyps    Pneumonia, organism unspecified(486)    Pure hypercholesterolemia    Rheumatoid arthritis (HCC)    Shortness of breath    Stroke (HCC) 08/23/2003  Tobacco use disorder 03/16/2007   patient risk factors   Unspecified hypothyroidism 01/07/2010   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  CT head negative, MRI brain negative, EEG: diffuse slowing  but no  seizure acitivity.  Labs negative.  [facility].   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   COLONOSCOPY WITH PROPOFOL  N/A 03/10/2020   Procedure: COLONOSCOPY WITH PROPOFOL ;  Surgeon: Therisa Bi, MD;  Location: [facility] ENDOSCOPY;  Service: Gastroenterology;  Laterality: N/A;   COLONOSCOPY WITH PROPOFOL  N/A 03/16/2021   Procedure: COLONOSCOPY WITH PROPOFOL ;  Surgeon: Therisa Bi, MD;  Location: [facility] ENDOSCOPY;  Service: Gastroenterology;  Laterality: N/A;   COLONOSCOPY WITH PROPOFOL  N/A 06/22/2022   Procedure: COLONOSCOPY WITH PROPOFOL ;  Surgeon: Therisa Bi, MD;  Location: [facility] ENDOSCOPY;  Service: Endoscopy;  Laterality: N/A;   ESOPHAGOGASTRODUODENOSCOPY (EGD) WITH PROPOFOL  N/A 03/16/2021   Procedure: ESOPHAGOGASTRODUODENOSCOPY (EGD) WITH PROPOFOL ;  Surgeon: Therisa Bi, MD;  Location: [facility] ENDOSCOPY;  Service: Gastroenterology;  Laterality: N/A;   EYE SURGERY  2010   GIVENS CAPSULE STUDY N/A 04/16/2021   Procedure: GIVENS CAPSULE STUDY;  Surgeon: Therisa Bi, MD;  Location: [facility] ENDOSCOPY;  Service: Gastroenterology;  Laterality: N/A;   LAPAROSCOPIC REVISION VENTRICULAR-PERITONEAL (V-P) SHUNT Right 05/20/2014   Procedure: LAPAROSCOPIC REVISION VENTRICULAR-PERITONEAL (V-P) SHUNT;  Surgeon: Dann Hummer, MD;  Location: [facility] NEURO ORS;  Service: General;  Laterality: Right;   LOWER EXTREMITY ANGIOGRAPHY Left 06/11/2020   Procedure: LOWER EXTREMITY ANGIOGRAPHY;  Surgeon: Marea Selinda RAMAN, MD;  Location: [facility] INVASIVE CV LAB;  Service: Cardiovascular;  Laterality: Left;   Neuropsychiatric evaluation  04/2012   poor short-term memory, poor recall, delayed reaction time; permanently disabled.      Social History:  reports that he quit smoking about 4 years ago. His smoking use included cigarettes. He started smoking about 34 years ago. He has a 30 pack-year smoking history. He has never used smokeless tobacco. He reports that he does not drink alcohol and does not use drugs.  Family History:  Family  History  Problem Relation Age of Onset   Heart disease Father        cad, chf   Cancer Father    Heart disease Brother 28       CABG   Cancer Brother 13       pancreatic cancer   Diabetes Brother    Diabetes Brother    Heart disease Brother 23       CABG   Diabetes Brother    Heart disease Brother 6       CABG   Cancer Brother 35       prostate cancer   Heart disease Mother        cad   Diabetes Mother    Stroke Mother      Prior to Admission medications  Medication Sig Start Date End Date Taking? Authorizing Provider  Accu-Chek FastClix Lancets MISC Apply topically daily. 03/09/21   [provider]  albuterol  (PROVENTIL ) (2.5 MG/3ML) 0.083% nebulizer solution Inhale 2.5 mg into the lungs every 6 (six) hours as needed for wheezing. 07/18/23   [provider]  albuterol  (VENTOLIN  HFA) 108 (90 Base) MCG/ACT inhaler Inhale 2 puffs into the lungs every 6 (six) hours as needed for wheezing or shortness of breath. 05/21/19   Josette Ade, MD  ARIPiprazole  (ABILIFY ) 5 MG tablet Take 5  mg by mouth daily.    [provider]  ASPIRIN  LOW DOSE 81 MG tablet TAKE 1 TABLET BY MOUTH EVERY DAY 01/25/24   Dew, Jason S, MD  atorvastatin  (LIPITOR) 20 MG tablet Take 1 tablet (20 mg total) by mouth daily at 6 PM. 10/24/17   Smith, Kristi M, MD  Blood Glucose Monitoring Suppl (ACCU-CHEK GUIDE) w/Device KIT See admin instructions. 05/22/19   [provider]  BREZTRI  AEROSPHERE 160-9-4.8 MCG/ACT AERO SMARTSIG:2 Inhalation Via Inhaler Twice Daily 05/11/21   [provider]  clopidogrel  (PLAVIX ) 75 MG tablet Take 1 tablet (75 mg total) by mouth daily. 05/20/24   Brown, Fallon E, NP  diazepam  (VALIUM ) 5 MG tablet Take 5 mg by mouth every 12 (twelve) hours. 05/26/21   [provider]  Docusate Sodium  100 MG capsule Take 100 mg by mouth in the morning, at noon, and at bedtime.    [provider]  Ensure Max Protein (ENSURE MAX PROTEIN) LIQD Take 330  mLs (11 oz total) by mouth 2 (two) times daily. 05/21/19   Josette Ade, MD  glucose blood test strip See admin instructions. 06/18/21   [provider]  ipratropium (ATROVENT ) 0.03 % nasal spray Place 1 spray into the nose 2 (two) times daily as needed for rhinitis. 11/28/23 11/27/24  [provider]  ipratropium-albuterol  (DUONEB) 0.5-2.5 (3) MG/3ML SOLN Inhale 3 mLs into the lungs 4 (four) times daily. 12/07/23 12/01/24  [provider]  levothyroxine  (SYNTHROID ) 100 MCG tablet Take 100 mcg by mouth every morning.    [provider]  metFORMIN  (GLUCOPHAGE ) 1000 MG tablet TAKE 1 TABLET BY MOUTH 2 TIMES DAILY WITH MEAL. Patient taking differently: Take 1,000 mg by mouth 2 (two) times daily with a meal. 03/16/18   Claudene Rayfield HERO, MD  metoprolol  succinate (TOPROL -XL) 25 MG 24 hr tablet Take 12.5 mg by mouth daily. 04/22/21   [provider]  midodrine (PROAMATINE) 2.5 MG tablet Take 2.5 mg by mouth 2 (two) times daily with a meal. 12/18/23 12/17/24  [provider]  mirtazapine  (REMERON ) 45 MG tablet Take 45 mg by mouth at bedtime. 04/04/23   [provider]  morphine  (MS CONTIN ) 30 MG 12 hr tablet Take 30 mg by mouth every 12 (twelve) hours. 02/05/21   [provider]  naloxone  (NARCAN ) nasal spray 4 mg/0.1 mL Place into the nose.    [provider]  pantoprazole  (PROTONIX ) 40 MG tablet Take 40 mg by mouth 2 (two) times daily before a meal. 01/20/23   [provider]  Pirfenidone 267 MG TABS Take 801 mg by mouth 3 (three) times daily. 06/30/21   [provider]  predniSONE  (DELTASONE ) 10 MG tablet Take 10 mg by mouth daily. 05/03/21   [provider]  rOPINIRole  (REQUIP ) 0.5 MG tablet Take 0.5 mg by mouth at bedtime. 09/23/19 06/13/24  [provider]  tamsulosin  (FLOMAX ) 0.4 MG CAPS capsule Take 2 capsules (0.8 mg total) by mouth daily after supper. 06/13/24   Francisca Redell BROCKS, MD  torsemide  (DEMADEX) 20 MG tablet Take 1 tablet (20 mg total) by mouth daily. Skip the dose if systolic BP less than 120 mmHg 12/14/23   Von Bellis, MD  venlafaxine  XR (EFFEXOR -XR) 150 MG 24 hr capsule Take 1 capsule (150 mg total) by mouth daily with breakfast. 01/24/18   Claudene Rayfield HERO, MD  Vitamin D , Ergocalciferol , (DRISDOL ) 1.25 MG (50000 UNIT) CAPS capsule Take 50,000 Units by mouth every 7 (seven) days. 07/09/20  [provider]    Physical Exam: Vitals:   08/31/24 1937 08/31/24 1938 08/31/24 2101 09/01/24 0024  BP: 91/71  108/72 117/74  Pulse: (!) 103  83 82  Resp: 18  18 14   Temp: 98 F (36.7 C)  97.8 F (36.6 C) (!) 97.4 F (36.3 C)  TempSrc: Oral  Oral   SpO2: 94%  100% 100%  Weight:  78 kg    Height:  5' 9 (1.753 m)     General: Has moderate acute respiratory distress, has dry mucous membrane. HEENT:       Eyes: PERRL, EOMI, no jaundice       ENT: No discharge from the ears and nose, no pharynx injection, no tonsillar enlargement.        Neck: No JVD, no bruit, no mass felt. Heme: No neck lymph node enlargement. Cardiac: S1/S2, RRR, No murmurs, No gallops or rubs. Respiratory: Has decreased I have mild bilaterally, has wheezing bilaterally, has velcro sound GI: Soft, nondistended, nontender, no rebound pain, no organomegaly, BS present. GU: No hematuria Ext: No pitting leg edema bilaterally. 1+DP/PT pulse bilaterally. Musculoskeletal: No joint deformities, No joint redness or warmth, no limitation of ROM in spin. Skin: No rashes.  Neuro: Alert, oriented X3, cranial nerves II-XII grossly intact, moves all extremities normally.  Psych: Patient is not psychotic, no suicidal or hemocidal ideation.  Labs on Admission: I have personally reviewed following labs and imaging studies  CBC: Recent Labs  Lab 08/31/24 1940  WBC 10.3  HGB 12.5*  HCT 38.1*  MCV 92.9  PLT 285   Basic Metabolic Panel: Recent Labs  Lab 08/31/24 1940  NA 137  K 4.7  CL 98  CO2 20*   GLUCOSE 175*  BUN 33*  CREATININE 1.88*  CALCIUM  9.4   GFR: Estimated Creatinine Clearance: 38.7 mL/min (A) (by C-G formula based on SCr of 1.88 mg/dL (H)). Liver Function Tests: No results for input(s): AST, ALT, ALKPHOS, BILITOT, PROT, ALBUMIN in the last 168 hours. No results for input(s): LIPASE, AMYLASE in the last 168 hours. No results for input(s): AMMONIA in the last 168 hours. Coagulation Profile: No results for input(s): INR, PROTIME in the last 168 hours. Cardiac Enzymes: No results for input(s): CKTOTAL, CKMB, CKMBINDEX, TROPONINI in the last 168 hours. BNP (last 3 results) Recent Labs    08/31/24 2240  PROBNP 278.0   HbA1C: No results for input(s): HGBA1C in the last 72 hours. CBG: No results for input(s): GLUCAP in the last 168 hours. Lipid Profile: No results for input(s): CHOL, HDL, LDLCALC, TRIG, CHOLHDL, LDLDIRECT in the last 72 hours. Thyroid  Function Tests: No results for input(s): TSH, T4TOTAL, FREET4, T3FREE, THYROIDAB in the last 72 hours. Anemia Panel: No results for input(s): VITAMINB12, FOLATE, FERRITIN, TIBC, IRON, RETICCTPCT in the last 72 hours. Urine analysis:    Component Value Date/Time   COLORURINE YELLOW (A) 09/01/2024 0029   APPEARANCEUR HAZY (A) 09/01/2024 0029   APPEARANCEUR Clear 03/02/2021 1456   LABSPEC 1.025 09/01/2024 0029   LABSPEC 1.006 11/20/2011 2054   PHURINE 5.0 09/01/2024 0029   GLUCOSEU 150 (A) 09/01/2024 0029   GLUCOSEU Negative 11/20/2011 2054   HGBUR SMALL (A) 09/01/2024 0029   BILIRUBINUR NEGATIVE 09/01/2024 0029   BILIRUBINUR Negative 03/02/2021 1456   BILIRUBINUR Negative 11/20/2011 2054   KETONESUR NEGATIVE 09/01/2024 0029   PROTEINUR 100 (A) 09/01/2024 0029   UROBILINOGEN 1.0 03/01/2017 1249   UROBILINOGEN 1.0 03/13/2015 2215   NITRITE NEGATIVE 09/01/2024 0029   LEUKOCYTESUR  NEGATIVE 09/01/2024 0029   LEUKOCYTESUR Negative 11/20/2011 2054    Sepsis Labs: @LABRCNTIP (procalcitonin:4,lacticidven:4) ) Recent Results (from the past 240 hours)  Resp panel by RT-PCR (RSV, Flu A&B, Covid) Anterior Nasal Swab     Status: None   Collection Time: 08/31/24 10:40 PM   Specimen: Anterior Nasal Swab  Result Value Ref Range Status   SARS Coronavirus 2 by RT PCR NEGATIVE NEGATIVE Final    Comment: (NOTE) SARS-CoV-2 target nucleic acids are NOT DETECTED.  The SARS-CoV-2 RNA is generally detectable in upper respiratory specimens during the acute phase of infection. The lowest concentration of SARS-CoV-2 viral copies this assay can detect is 138 copies/mL. A negative result does not preclude SARS-Cov-2 infection and should not be used as the sole basis for treatment or other patient management decisions. A negative result may occur with  improper specimen collection/handling, submission of specimen other than nasopharyngeal swab, presence of viral mutation(s) within the areas targeted by this assay, and inadequate number of viral copies(<138 copies/mL). A negative result must be combined with clinical observations, patient history, and epidemiological information. The expected result is Negative.  Fact Sheet for Patients:  bloggercourse.com  Fact Sheet for Healthcare Providers:  seriousbroker.it  This test is no t yet approved or cleared by the [place]  FDA and  has been authorized for detection and/or diagnosis of SARS-CoV-2 by FDA under an Emergency Use Authorization (EUA). This EUA will remain  in effect (meaning this test can be used) for the duration of the COVID-19 declaration under Section 564(b)(1) of the Act, 21 U.S.C.section 360bbb-3(b)(1), unless the authorization is terminated  or revoked sooner.       Influenza A by PCR NEGATIVE NEGATIVE Final   Influenza B by PCR NEGATIVE NEGATIVE Final    Comment: (NOTE) The Xpert Xpress SARS-CoV-2/FLU/RSV plus assay is  intended as an aid in the diagnosis of influenza from Nasopharyngeal swab specimens and should not be used as a sole basis for treatment. Nasal washings and aspirates are unacceptable for Xpert Xpress SARS-CoV-2/FLU/RSV testing.  Fact Sheet for Patients: bloggercourse.com  Fact Sheet for Healthcare Providers: seriousbroker.it  This test is not yet approved or cleared by the [place]  FDA and has been authorized for detection and/or diagnosis of SARS-CoV-2 by FDA under an Emergency Use Authorization (EUA). This EUA will remain in effect (meaning this test can be used) for the duration of the COVID-19 declaration under Section 564(b)(1) of the Act, 21 U.S.C. section 360bbb-3(b)(1), unless the authorization is terminated or revoked.     Resp Syncytial Virus by PCR NEGATIVE NEGATIVE Final    Comment: (NOTE) Fact Sheet for Patients: bloggercourse.com  Fact Sheet for Healthcare Providers: seriousbroker.it  This test is not yet approved or cleared by the [place]  FDA and has been authorized for detection and/or diagnosis of SARS-CoV-2 by FDA under an Emergency Use Authorization (EUA). This EUA will remain in effect (meaning this test can be used) for the duration of the COVID-19 declaration under Section 564(b)(1) of the Act, 21 U.S.C. section 360bbb-3(b)(1), unless the authorization is terminated or revoked.  Performed at [facility], [address] [place], [place] [address]      Radiological Exams on Admission:   Assessment/Plan Principal Problem:   Acute on chronic respiratory failure with hypoxia [facility]) Active Problems:   COPD exacerbation (HCC)   Pulmonary fibrosis (HCC)   Atrial fibrillation, chronic (HCC)   Myocardial injury   Acute renal failure superimposed on stage 3a chronic kidney disease (HCC)  Chronic diastolic CHF (congestive heart  failure) (HCC)   Type II diabetes mellitus with renal manifestations (HCC)   Hyperlipidemia   Stroke (HCC)   Hypothyroidism   Rheumatoid arthritis (HCC)   Chronic pain syndrome   Anxiety and depression   Assessment and Plan:  Acute on chronic respiratory failure with hypoxia due to COPD exacerbation and pulmonary fibrosis: Has increased oxygen requirement from 4 to 6 L.  -will admit patient to PCU as inpt -Bronchodilators and prn Mucinex  -also continue home Ensifntrine, Jascayd  -Solu-Medrol  80 mg IV daily after given 125 mg of Solu-Medrol  -Incentive spirometry -check RVP -Follow up sputum culture  Atrial fibrillation, chronic (HCC):  HR is 103 --> 80s.  Patient is not taking anticoagulants. - Continue metoprolol  12.5 mg daily  Myocardial injury: Troponin 26 --> 36, no chest pain, likely demand ischemia. -Aspirin , Plavix , Lipitor  Chronic diastolic CHF (congestive heart failure) (HCC): 2D echo on 12/14/2023 showed EF of 50-55% with grade 1 diastolic dysfunction.  Patient is clinically dry. - Watch volume status closely. - Hold torsemide due to worsening renal function.   Acute renal failure superimposed on stage 3a chronic kidney disease (HCC): Baseline creatinine 1.35 on 04/27/24.  His creatinine is 1.88, BUN 33, GFR 39.  Likely due to dehydration.  Patient has decreased urine output, but renal ultrasound is negative for obstruction or urinary retention. - Patient was given 500 cc normal saline bolus in ED - Hold torsemide -f/u UA   Adult hypothyroidism -Continue Synthroid    Type II diabetes mellitus with renal manifestations [facility]): Recent A1c 6.1, well controlled . Patient is taking metformin  -Sliding scale insulin    History of Stroke (HCC) -Plavix , ASA and lipitor   HLD -Lipitor  Chronic pain syndrome -Continue home MS Contin  30 mg twice daily   Anxiety and depression - Continue home medications: Effexor , Requip , Valium , Abilify    BPH: -flomax   Rheumatoid  arthritis (HCC): -pt is on prednisone  10 mg daily and monthly Cimzia injection. - Follow-up with rheumatologist          DVT ppx: SQ Lovenox   Code Status: Full code   Family Communication:   Yes, patient's wife at bed side.     Disposition Plan:  Anticipate discharge back to previous environment  Consults called:  none  Admission status and Level of care: Progressive:  as inpt        Dispo: The patient is from: Home              Anticipated d/c is to: Home              Anticipated d/c date is: 2 days              Patient currently is not medically stable to d/c.    Severity of Illness:  The appropriate patient status for this patient is INPATIENT. Inpatient status is judged to be reasonable and necessary in order to provide the required intensity of service to ensure the patient's safety. The patient's presenting symptoms, physical exam findings, and initial radiographic and laboratory data in the context of their chronic comorbidities is felt to place them at high risk for further clinical deterioration. Furthermore, it is not anticipated that the patient will be medically stable for discharge from the hospital within 2 midnights of admission.   * I certify that at the point of admission it is my clinical judgment that the patient will require inpatient hospital care spanning beyond 2 midnights from the point of admission due to  high intensity of service, high risk for further deterioration and high frequency of surveillance required.*       Date of Service 09/01/2024    Caleb Exon [facility]   If 7PM-7AM, please contact night-coverage [email or web address] 09/01/2024, 1:12 AM     [1]  Allergies Allergen Reactions   Advair Diskus [Fluticasone-Salmeterol] Shortness Of Breath   Erythromycin Nausea And Vomiting   Iodinated Contrast Media Shortness Of Breath and Other (See Comments)   Ativan  [Lorazepam ] Other (See Comments)    Caused pt's heart to stop    Cephalexin Other (See Comments)    Heart Burn   Darvocet [Propoxyphene N-Acetaminophen ] Nausea And Vomiting   Doxycycline Hyclate Nausea And Vomiting, Swelling and Other (See Comments)    Tongue swelling, severe depression   Hydroxychloroquine Other (See Comments)    Restless leg   Propoxyphene Nausea And Vomiting   Septra [Sulfamethoxazole-Trimethoprim] Rash   "

## 2024-08-31 NOTE — ED Triage Notes (Signed)
 Pt has IPF- c/o of increased SHOB x2 days and has only urinated once today. Pt denies CP, dizziness, no new cough. 4 liter's [place] chronically.

## 2024-08-31 NOTE — ED Notes (Signed)
 PT states unable to provide a urine specimen at the current time.

## 2024-08-31 NOTE — ED Provider Notes (Signed)
 "  [facility] Provider Note    Event Date/Time   First MD Initiated Contact with Patient 08/31/24 2102     (approximate)   History   Shortness of Breath   HPI  Kirk Robinson is a 69 y.o. male  ale with a history of atrial fibrillation, COPD, pulmonary fibrosis on 4 L of oxygen at baseline, RA, CKD, type 2 diabetes, stroke, and SVT on chronic benzos who presents to the emergency department with shortness of breath and generalized weakness for the past 2 days and decreased urinary output.  Patient reports that he has only urinated once today.  Reports compliance with all of his medications.  There has been no falls.  Denies any headache hearing or vision changes.  Reports that it is difficult to get a breath then. Wife is at bedside and contributes to the history     Physical Exam   Triage Vital Signs: ED Triage Vitals  Encounter Vitals Group     BP 08/31/24 1937 91/71     Girls Systolic BP Percentile --      Girls Diastolic BP Percentile --      Boys Systolic BP Percentile --      Boys Diastolic BP Percentile --      Pulse Rate 08/31/24 1937 (!) 103     Resp 08/31/24 1937 18     Temp 08/31/24 1937 98 F (36.7 C)     Temp Source 08/31/24 1937 Oral     SpO2 08/31/24 1937 94 %     Weight 08/31/24 1938 172 lb (78 kg)     Height 08/31/24 1938 5' 9 (1.753 m)     Head Circumference --      Peak Flow --      Pain Score 08/31/24 1937 7     Pain Loc --      Pain Education --      Exclude from Growth Chart --     Most recent vital signs: Vitals:   08/31/24 2101 09/01/24 0024  BP: 108/72 117/74  Pulse: 83 82  Resp: 18 14  Temp: 97.8 F (36.6 C) (!) 97.4 F (36.3 C)  SpO2: 100% 100%    Nursing Triage Note reviewed. Vital signs reviewed and patients oxygen saturation is hypoxic   General: Patient is thin, chronically ill, appears unwell Head: Normocephalic and atraumatic Eyes: Normal inspection, extraocular muscles intact, no conjunctival  pallor Ear, nose, throat: Normal external exam Neck: Normal range of motion Respiratory: Patient is in moderate respiratory distress, lungs with multiple wheezes and bronchospasm Cardiovascular: Patient is tachycardic, RR without murmur GI: Abd SNT with no guarding or rebound  Back: Normal inspection of the back with good strength and range of motion throughout all ext Extremities: pulses intact with good cap refills, no LE pitting edema or calf tenderness Neuro: The patient is alert and oriented to person, place, and time, appropriately conversive, with 5/5 bilat UE/LE strength, no gross motor or sensory defects noted. Coordination appears to be adequate very tremulous Skin: Warm, dry, and intact Psych: normal mood and affect, no SI or HI  ED Results / Procedures / Treatments   Labs (all labs ordered are listed, but only abnormal results are displayed) Labs Reviewed  BASIC METABOLIC PANEL WITH GFR - Abnormal; Notable for the following components:      Result Value   CO2 20 (*)    Glucose, Bld 175 (*)    BUN 33 (*)    Creatinine, Ser  1.88 (*)    GFR, Estimated 39 (*)    Anion gap 20 (*)    All other components within normal limits  CBC - Abnormal; Notable for the following components:   RBC 4.10 (*)    Hemoglobin 12.5 (*)    HCT 38.1 (*)    All other components within normal limits  TROPONIN T, HIGH SENSITIVITY - Abnormal; Notable for the following components:   Troponin T High Sensitivity 26 (*)    All other components within normal limits  TROPONIN T, HIGH SENSITIVITY - Abnormal; Notable for the following components:   Troponin T High Sensitivity 35 (*)    All other components within normal limits  RESP PANEL BY RT-PCR (RSV, FLU A&B, COVID)  [id number]  EXPECTORATED SPUTUM ASSESSMENT W GRAM STAIN, RFLX TO RESP C  RESPIRATORY PANEL BY PCR  PROCALCITONIN  PRO BRAIN NATRIURETIC PEPTIDE  URINALYSIS, COMPLETE (UACMP) WITH MICROSCOPIC  BASIC METABOLIC PANEL WITH GFR  CBC   TROPONIN T, HIGH SENSITIVITY     EKG EKG and rhythm strip are interpreted by myself:   EKG: tachycardic sinus rhythm] at heart rate of 111, normal QRS duration, QTc 421, nonspecific ST segments and T waves no ectopy EKG not consistent with Acute STEMI Rhythm strip: Tachycardic sinus rhythm in lead II   RADIOLOGY Chest x-ray: Consistent with pulmonary fibrosis on my independent review interpretation radiologist agrees    PROCEDURES:  Critical Care performed: No  Procedures   MEDICATIONS ORDERED IN ED: Medications  enoxaparin  (LOVENOX ) injection 40 mg (has no administration in time range)  aspirin  EC tablet 81 mg (has no administration in time range)  morphine  (MS CONTIN ) 12 hr tablet 30 mg (has no administration in time range)  atorvastatin  (LIPITOR) tablet 20 mg (has no administration in time range)  metoprolol  succinate (TOPROL -XL) 24 hr tablet 12.5 mg (has no administration in time range)  ARIPiprazole  (ABILIFY ) tablet 5 mg (has no administration in time range)  diazepam  (VALIUM ) tablet 5 mg (has no administration in time range)  mirtazapine  (REMERON ) tablet 45 mg (has no administration in time range)  venlafaxine  XR (EFFEXOR -XR) 24 hr capsule 150 mg (has no administration in time range)  levothyroxine  (SYNTHROID ) tablet 100 mcg (has no administration in time range)  pantoprazole  (PROTONIX ) EC tablet 40 mg (has no administration in time range)  tamsulosin  (FLOMAX ) capsule 0.8 mg (has no administration in time range)  clopidogrel  (PLAVIX ) tablet 75 mg (has no administration in time range)  rOPINIRole  (REQUIP ) tablet 0.5 mg (has no administration in time range)  ipratropium-albuterol  (DUONEB) 0.5-2.5 (3) MG/3ML nebulizer solution 3 mL (has no administration in time range)  nerandomilast  (JASCAYD ) tablet 18 mg (has no administration in time range)  Ensifentrine  SUSP 1 Inhalation (has no administration in time range)  albuterol  (PROVENTIL ) (2.5 MG/3ML) 0.083% nebulizer  solution 2.5 mg (has no administration in time range)  dextromethorphan-guaiFENesin  (MUCINEX  DM) 30-600 MG per 12 hr tablet 1 tablet (has no administration in time range)  methylPREDNISolone  sodium succinate  (SOLU-MEDROL ) 125 mg/2 mL injection 80 mg (has no administration in time range)  ondansetron  (ZOFRAN ) injection 4 mg (has no administration in time range)  hydrALAZINE  (APRESOLINE ) injection 5 mg (has no administration in time range)  acetaminophen  (TYLENOL ) tablet 650 mg (has no administration in time range)  insulin  aspart (novoLOG ) injection 0-9 Units (has no administration in time range)  insulin  aspart (novoLOG ) injection 0-5 Units (has no administration in time range)  sodium chloride  0.9 % bolus 500 mL (500 mLs Intravenous New  Bag/Given 08/31/24 2231)  diazepam  (VALIUM ) tablet 5 mg (5 mg Oral Given 08/31/24 2222)  ipratropium-albuterol  (DUONEB) 0.5-2.5 (3) MG/3ML nebulizer solution 3 mL (3 mLs Nebulization Given 08/31/24 2232)  ipratropium-albuterol  (DUONEB) 0.5-2.5 (3) MG/3ML nebulizer solution 3 mL (3 mLs Nebulization Given 08/31/24 2318)  methylPREDNISolone  sodium succinate  (SOLU-MEDROL ) 125 mg/2 mL injection 125 mg (125 mg Intravenous Given 08/31/24 2317)     IMPRESSION / MDM / ASSESSMENT AND PLAN / ED COURSE                                Differential diagnosis includes, but is not limited to, worsening bronchospasm, URI, CHF, pneumonia, acute renal insufficiency, hydronephrosis, electrolyte derangement   ED course: Patient appears unwell and pulmonary exam is consistent with bronchospasm.  He is requiring slightly more oxygen than his baseline.  He was given a breathing treatment with some improvement we will start him on Solu-Medrol  and give him another DuoNeb.  Chest x-ray without any signs of inflammation.  He does not have a leukocytosis.  COVID swab and procalcitonin is pending.  His creatinine seems to be at baseline for him but I did give him a little bit of fluid as he  appears dry.  BNP is pending.  Anticipate patient will require admission today   Clinical Course as of 09/01/24 0045  Sat Aug 31, 2024  2300 WBC: 10.3 No leukocytosis [HD]  2301 Basic metabolic panel(!) No profound electrolyte derangements [HD]  2301 Creatinine(!): 1.88 At patient's baseline [HD]  2301 Troponin T High Sensitivity(!): 26 In the indeterminate zone [HD]    Clinical Course User Index [HD] Nicholaus Rolland BRAVO, MD   -- Risk: 5 This patient has a high risk of morbidity due to further diagnostic testing or treatment. Rationale: This patients evaluation and management involve a high risk of morbidity due to the potential severity of presenting symptoms, need for diagnostic testing, and/or initiation of treatment that may require close monitoring. The differential includes conditions with potential for significant deterioration or requiring escalation of care. Treatment decisions in the ED, including medication administration, procedural interventions, or disposition planning, reflect this level of risk. COPA: 5 The patient has the following acute or chronic illness/injury that poses a possible threat to life or bodily function: [X] : The patient has a potentially serious acute condition or an acute exacerbation of a chronic illness requiring urgent evaluation and management in the Emergency Department. The clinical presentation necessitates immediate consideration of life-threatening or function-threatening diagnoses, even if they are ultimately ruled out.   FINAL CLINICAL IMPRESSION(S) / ED DIAGNOSES   Final diagnoses:  Hypoxia  COPD exacerbation (HCC)  Acute renal insufficiency  General weakness  Chronically on benzodiazepine therapy     Rx / DC Orders   ED Discharge Orders     None        Note:  This document was prepared using Dragon voice recognition software and may include unintentional dictation errors.   Nicholaus Rolland BRAVO, MD 09/01/24 0045  "

## 2024-08-31 NOTE — ED Notes (Signed)
 Fall risk bundle is currently in place.

## 2024-09-01 DIAGNOSIS — J9621 Acute and chronic respiratory failure with hypoxia: Secondary | ICD-10-CM | POA: Diagnosis not present

## 2024-09-01 DIAGNOSIS — N1831 Chronic kidney disease, stage 3a: Secondary | ICD-10-CM

## 2024-09-01 DIAGNOSIS — G894 Chronic pain syndrome: Secondary | ICD-10-CM

## 2024-09-01 DIAGNOSIS — N179 Acute kidney failure, unspecified: Secondary | ICD-10-CM | POA: Diagnosis not present

## 2024-09-01 DIAGNOSIS — I482 Chronic atrial fibrillation, unspecified: Secondary | ICD-10-CM

## 2024-09-01 DIAGNOSIS — J441 Chronic obstructive pulmonary disease with (acute) exacerbation: Secondary | ICD-10-CM

## 2024-09-01 LAB — CBC
HCT: 35.2 % — ABNORMAL LOW (ref 39.0–52.0)
Hemoglobin: 11 g/dL — ABNORMAL LOW (ref 13.0–17.0)
MCH: 30.4 pg (ref 26.0–34.0)
MCHC: 31.3 g/dL (ref 30.0–36.0)
MCV: 97.2 fL (ref 80.0–100.0)
Platelets: 208 K/uL (ref 150–400)
RBC: 3.62 MIL/uL — ABNORMAL LOW (ref 4.22–5.81)
RDW: 13.2 % (ref 11.5–15.5)
WBC: 8.1 K/uL (ref 4.0–10.5)
nRBC: 0 % (ref 0.0–0.2)

## 2024-09-01 LAB — TROPONIN T, HIGH SENSITIVITY
Troponin T High Sensitivity: 37 ng/L — ABNORMAL HIGH (ref 0–19)
Troponin T High Sensitivity: 47 ng/L — ABNORMAL HIGH (ref 0–19)

## 2024-09-01 LAB — URINALYSIS, COMPLETE (UACMP) WITH MICROSCOPIC
Bacteria, UA: NONE SEEN
Bilirubin Urine: NEGATIVE
Glucose, UA: 150 mg/dL — AB
Ketones, ur: NEGATIVE mg/dL
Leukocytes,Ua: NEGATIVE
Nitrite: NEGATIVE
Protein, ur: 100 mg/dL — AB
Specific Gravity, Urine: 1.025 (ref 1.005–1.030)
pH: 5 (ref 5.0–8.0)

## 2024-09-01 LAB — CBG MONITORING, ED
Glucose-Capillary: 133 mg/dL — ABNORMAL HIGH (ref 70–99)
Glucose-Capillary: 152 mg/dL — ABNORMAL HIGH (ref 70–99)
Glucose-Capillary: 159 mg/dL — ABNORMAL HIGH (ref 70–99)
Glucose-Capillary: 209 mg/dL — ABNORMAL HIGH (ref 70–99)
Glucose-Capillary: 191 mg/dL — ABNORMAL HIGH (ref 70–99)

## 2024-09-01 LAB — BASIC METABOLIC PANEL WITH GFR
Anion gap: 24 — ABNORMAL HIGH (ref 5–15)
BUN: 35 mg/dL — ABNORMAL HIGH (ref 8–23)
CO2: 18 mmol/L — ABNORMAL LOW (ref 22–32)
Calcium: 9 mg/dL (ref 8.9–10.3)
Chloride: 96 mmol/L — ABNORMAL LOW (ref 98–111)
Creatinine, Ser: 2.1 mg/dL — ABNORMAL HIGH (ref 0.61–1.24)
GFR, Estimated: 34 mL/min — ABNORMAL LOW
Glucose, Bld: 173 mg/dL — ABNORMAL HIGH (ref 70–99)
Potassium: 5.2 mmol/L — ABNORMAL HIGH (ref 3.5–5.1)
Sodium: 138 mmol/L (ref 135–145)

## 2024-09-01 LAB — RESPIRATORY PANEL BY PCR

## 2024-09-01 MED ORDER — METOPROLOL SUCCINATE ER 25 MG PO TB24
12.5000 mg | ORAL_TABLET | Freq: Every day | ORAL | Status: DC
  Administered 2024-09-01 – 2024-09-04 (×3): 12.5 mg via ORAL
  Filled 2024-09-01 (×4): qty 1

## 2024-09-01 MED ORDER — INSULIN ASPART 100 UNIT/ML IJ SOLN: 0.0000 [IU] | Freq: Every day | INTRAMUSCULAR | Status: DC

## 2024-09-01 MED ORDER — TAMSULOSIN HCL 0.4 MG PO CAPS
0.8000 mg | ORAL_CAPSULE | Freq: Every day | ORAL | Status: DC
  Administered 2024-09-01 – 2024-09-04 (×4): 0.8 mg via ORAL
  Filled 2024-09-01 (×4): qty 2

## 2024-09-01 MED ORDER — CLOPIDOGREL BISULFATE 75 MG PO TABS
75.0000 mg | ORAL_TABLET | Freq: Every day | ORAL | Status: DC
  Administered 2024-09-01 – 2024-09-04 (×4): 75 mg via ORAL
  Filled 2024-09-01 (×4): qty 1

## 2024-09-01 MED ORDER — ACETAMINOPHEN 325 MG PO TABS: 650.0000 mg | ORAL_TABLET | Freq: Four times a day (QID) | ORAL | Status: DC | PRN

## 2024-09-01 MED ORDER — SODIUM ZIRCONIUM CYCLOSILICATE 10 G PO PACK
10.0000 g | PACK | Freq: Once | ORAL | Status: AC
  Administered 2024-09-01: 10 g via ORAL
  Filled 2024-09-01: qty 1

## 2024-09-01 MED ORDER — IPRATROPIUM-ALBUTEROL 0.5-2.5 (3) MG/3ML IN SOLN
3.0000 mL | Freq: Four times a day (QID) | RESPIRATORY_TRACT | Status: DC
  Administered 2024-09-01 – 2024-09-03 (×9): 3 mL via RESPIRATORY_TRACT
  Filled 2024-09-01 (×9): qty 3

## 2024-09-01 MED ORDER — METHYLPREDNISOLONE SODIUM SUCC 125 MG IJ SOLR
80.0000 mg | Freq: Every day | INTRAMUSCULAR | Status: DC
  Administered 2024-09-01 – 2024-09-04 (×4): 80 mg via INTRAVENOUS
  Filled 2024-09-01 (×4): qty 2

## 2024-09-01 MED ORDER — NERANDOMILAST 18 MG PO TABS
18.0000 mg | ORAL_TABLET | Freq: Two times a day (BID) | ORAL | Status: DC
  Administered 2024-09-03 (×2): 18 mg via ORAL
  Filled 2024-09-01 (×4): qty 1

## 2024-09-01 MED ORDER — ENOXAPARIN SODIUM 40 MG/0.4ML IJ SOSY
40.0000 mg | PREFILLED_SYRINGE | INTRAMUSCULAR | Status: DC
  Administered 2024-09-01 – 2024-09-04 (×4): 40 mg via SUBCUTANEOUS
  Filled 2024-09-01 (×4): qty 0.4

## 2024-09-01 MED ORDER — DM-GUAIFENESIN ER 30-600 MG PO TB12: 1.0000 | ORAL_TABLET | Freq: Two times a day (BID) | ORAL | Status: DC | PRN

## 2024-09-01 MED ORDER — ATORVASTATIN CALCIUM 20 MG PO TABS
20.0000 mg | ORAL_TABLET | Freq: Every day | ORAL | Status: DC
  Administered 2024-09-01 – 2024-09-04 (×4): 20 mg via ORAL
  Filled 2024-09-01 (×4): qty 1

## 2024-09-01 MED ORDER — PANTOPRAZOLE SODIUM 40 MG PO TBEC
40.0000 mg | DELAYED_RELEASE_TABLET | Freq: Two times a day (BID) | ORAL | Status: DC
  Administered 2024-09-01 – 2024-09-04 (×8): 40 mg via ORAL
  Filled 2024-09-01 (×8): qty 1

## 2024-09-01 MED ORDER — DIAZEPAM 5 MG PO TABS
5.0000 mg | ORAL_TABLET | Freq: Two times a day (BID) | ORAL | Status: DC
  Administered 2024-09-01 – 2024-09-04 (×7): 5 mg via ORAL
  Filled 2024-09-01 (×7): qty 1

## 2024-09-01 MED ORDER — ARIPIPRAZOLE 5 MG PO TABS
5.0000 mg | ORAL_TABLET | Freq: Every day | ORAL | Status: DC
  Administered 2024-09-01 – 2024-09-04 (×4): 5 mg via ORAL
  Filled 2024-09-01 (×4): qty 1

## 2024-09-01 MED ORDER — CHLORHEXIDINE GLUCONATE CLOTH 2 % EX PADS
6.0000 | MEDICATED_PAD | Freq: Every day | CUTANEOUS | Status: DC
  Administered 2024-09-03 – 2024-09-04 (×2): 6 via TOPICAL
  Filled 2024-09-01 (×2): qty 6

## 2024-09-01 MED ORDER — ONDANSETRON HCL 4 MG/2ML IJ SOLN: 4.0000 mg | Freq: Three times a day (TID) | INTRAMUSCULAR | Status: DC | PRN

## 2024-09-01 MED ORDER — LEVOTHYROXINE SODIUM 50 MCG PO TABS
100.0000 ug | ORAL_TABLET | Freq: Every day | ORAL | Status: DC
  Administered 2024-09-01 – 2024-09-04 (×4): 100 ug via ORAL
  Filled 2024-09-01 (×2): qty 1
  Filled 2024-09-01 (×2): qty 2

## 2024-09-01 MED ORDER — ASPIRIN 81 MG PO TBEC
81.0000 mg | DELAYED_RELEASE_TABLET | Freq: Every day | ORAL | Status: DC
  Administered 2024-09-01 – 2024-09-04 (×4): 81 mg via ORAL
  Filled 2024-09-01 (×4): qty 1

## 2024-09-01 MED ORDER — ALBUTEROL SULFATE (2.5 MG/3ML) 0.083% IN NEBU: 2.5000 mg | INHALATION_SOLUTION | RESPIRATORY_TRACT | Status: DC | PRN

## 2024-09-01 MED ORDER — ENSIFENTRINE 3 MG/2.5ML IN SUSP
1.0000 | Freq: Every day | RESPIRATORY_TRACT | Status: DC
  Administered 2024-09-03 – 2024-09-04 (×2): 1 via RESPIRATORY_TRACT
  Filled 2024-09-01 (×2): qty 1

## 2024-09-01 MED ORDER — MIRTAZAPINE 15 MG PO TABS
45.0000 mg | ORAL_TABLET | Freq: Every day | ORAL | Status: DC
  Administered 2024-09-01 – 2024-09-03 (×3): 45 mg via ORAL
  Filled 2024-09-01 (×3): qty 3

## 2024-09-01 MED ORDER — MORPHINE SULFATE ER 15 MG PO TBCR
30.0000 mg | EXTENDED_RELEASE_TABLET | Freq: Two times a day (BID) | ORAL | Status: DC
  Administered 2024-09-01 – 2024-09-04 (×6): 30 mg via ORAL
  Filled 2024-09-01 (×7): qty 2

## 2024-09-01 MED ORDER — ROPINIROLE HCL 1 MG PO TABS
0.5000 mg | ORAL_TABLET | Freq: Every day | ORAL | Status: DC
  Administered 2024-09-01 – 2024-09-03 (×3): 0.5 mg via ORAL
  Filled 2024-09-01 (×2): qty 2
  Filled 2024-09-01: qty 1

## 2024-09-01 MED ORDER — INSULIN ASPART 100 UNIT/ML IJ SOLN
0.0000 [IU] | Freq: Three times a day (TID) | INTRAMUSCULAR | Status: DC
  Administered 2024-09-01: 3 [IU] via SUBCUTANEOUS
  Administered 2024-09-01 – 2024-09-02 (×3): 2 [IU] via SUBCUTANEOUS
  Administered 2024-09-02 – 2024-09-03 (×2): 5 [IU] via SUBCUTANEOUS
  Administered 2024-09-03: 1 [IU] via SUBCUTANEOUS
  Administered 2024-09-03: 5 [IU] via SUBCUTANEOUS
  Administered 2024-09-04 (×2): 3 [IU] via SUBCUTANEOUS
  Administered 2024-09-04: 1 [IU] via SUBCUTANEOUS
  Filled 2024-09-01: qty 5
  Filled 2024-09-01 (×2): qty 2
  Filled 2024-09-01: qty 1
  Filled 2024-09-01 (×2): qty 3
  Filled 2024-09-01: qty 1
  Filled 2024-09-01: qty 2
  Filled 2024-09-01 (×2): qty 5
  Filled 2024-09-01: qty 3

## 2024-09-01 MED ORDER — VENLAFAXINE HCL ER 75 MG PO CP24
150.0000 mg | ORAL_CAPSULE | Freq: Every day | ORAL | Status: DC
  Administered 2024-09-01 – 2024-09-04 (×4): 150 mg via ORAL
  Filled 2024-09-01: qty 1
  Filled 2024-09-01: qty 2
  Filled 2024-09-01: qty 1
  Filled 2024-09-01: qty 2
  Filled 2024-09-01: qty 1

## 2024-09-01 MED ORDER — HYDRALAZINE HCL 20 MG/ML IJ SOLN: 5.0000 mg | INTRAMUSCULAR | Status: DC | PRN

## 2024-09-01 NOTE — Progress Notes (Signed)
 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  He presented with shortness of breath which has been progressively worsening over the 2 days prior to arrival. On arrival patient was found to be in acute respiratory distress, difficulty speaking in full sentences, and increasing oxygen requirements.  Labs revealed worsening renal function.  Negative RVP, vitals consistent with tachycardia and hypoxemia requiring 6 L [place]. CXR showed chronic interstitial lung disease without infiltration.  He was admitted to progressive care unit for treatment of COPD exacerbation  Subjective: Patient reports he still feeling very unwell.  His wife is at bedside.  Patient was able to wean to baseline 4 L [place]   Objective: Vitals:   09/01/24 1130 09/01/24 1200 09/01/24 1230 09/01/24 1300  BP: 97/66 102/66 90/73 102/64  Pulse: 78 79 84 79  Resp: 14 14 15 13   Temp:      TempSrc:      SpO2: 100% 100% 100% 100%  Weight:      Height:        Intake/Output Summary (Last 24 hours) at 09/01/2024 1609 Last data filed at 08/31/2024 2330 Gross per 24 hour  Intake 499 ml  Output --  Net 499 ml   Filed Weights   08/31/24 1938  Weight: 78 kg    Examination: General exam: Appears calm and comfortable, NAD  Respiratory system: No work of breathing, poor aeration bilaterally, currently on 4 L [place] Cardiovascular system: S1 & S2 heard, RRR.  Gastrointestinal system: Abdomen is nondistended, soft and nontender.  Neuro: Alert and oriented.  Assessment &  Plan:  Principal Problem:   Acute on chronic respiratory failure with hypoxia (HCC) Active Problems:   COPD exacerbation (HCC)   Pulmonary fibrosis (HCC)   Atrial fibrillation, chronic (HCC)   Myocardial injury   Acute renal failure superimposed on stage 3a chronic kidney disease (HCC)   Chronic diastolic CHF (congestive heart failure) (HCC)   Type II diabetes mellitus with renal manifestations (HCC)   Hyperlipidemia   Stroke (HCC)   Hypothyroidism   Rheumatoid arthritis (HCC)   Chronic pain syndrome   Anxiety and depression    Acute on chronic respiratory failure with hypoxia due to COPD exacerbation pulmonary fibrosis - Increased O2 requirement, chronically on 4 L.  Requiring 6 L with respiratory distress on arrival - Continue with steroids, bronchodilators, Mucinex  - Continue home Ensifntrine, Jascayd -family has been made aware this is nonformulary and they will provide home meds - Encourage I-S - RVP negative - Follow-up on sputum culture - No evidence of infection at this time requiring antibiotics  A-fib, chronic - Heart rate on arrival 103 who has resolved - Not on anticoagulation at home - Continue with home metoprolol   Elevated troponin - Troponin 26->36 on repeat - No chest pain. Likely demand ischemia in setting of hypoxia - Continue home aspirin , Plavix , statin  Chronic diastolic CHF - Echocardiogram 7974: EF 50 to 55%, grade 1 diastolic dysfunction - Clinically euvolemic to dry - Monitor volume status closely - Hold Lasix given current AKI  Acute renal failure  superimposed on stage IIIa CKD - Baseline creatinine 1.35, creatinine on arrival 1.88 - Appears this is secondary to dehydration.  Has had decreased urinary output - Renal ultrasound negative for obstruction or retention - Status post 500 cc NS bolus in ED - Hold diuretics for now - UA: Not consistent with infection  Hyperkalemia - Potassium 5.2, treated.  Repeat in a.m.  Hypothyroidism -  Continue Synthroid   Type 2 diabetes with renal manifestations - Hemoglobin A1c 6.1%, well-controlled - Takes metformin  at home - Likely of hyperglycemia due to steroids while admitted.  Continue with sliding scale insulin  for now  History of CVA - Continue aspirin , Plavix , statin  Hyperlipidemia - Continue statin  Chronic pain - At home is on MS Contin  30 mg twice daily, continue this - PDMP reviewed  Anxiety Depression - Continue home meds: Effexor , Requip , Valium , Abilify   BPH - Continue Flomax   Rheumatoid arthritis - Daily prednisone  10 mg and monthly Cimzia injections - Follow outpatient with rheumatology - Currently on higher dose steroids as above.  Will gradually taper to home dose at time of discharge  Body mass index is 25.4 kg/m. - Outpatient follow up for lifestyle modification and risk factor management   DVT prophylaxis: Lovenox    Code Status: Full Code Disposition: Pending clinical improvement  Consultants:    Procedures:    Antimicrobials:  Anti-infectives (From admission, onward)    None       Data Reviewed: I have personally reviewed following labs and imaging studies CBC: Recent Labs  Lab 08/31/24 1940 09/01/24 0610  WBC 10.3 8.1  HGB 12.5* 11.0*  HCT 38.1* 35.2*  MCV 92.9 97.2  PLT 285 208   Basic Metabolic Panel: Recent Labs  Lab 08/31/24 1940 09/01/24 0610  NA 137 138  K 4.7 5.2*  CL 98 96*  CO2 20* 18*  GLUCOSE 175* 173*  BUN 33* 35*  CREATININE 1.88* 2.10*  CALCIUM  9.4 9.0   GFR: Estimated Creatinine Clearance: 34.6 mL/min (A) (by C-G formula based on SCr of 2.1 mg/dL (H)). Liver Function Tests: No results for input(s): AST, ALT, ALKPHOS, BILITOT, PROT, ALBUMIN in the last 168 hours. CBG: Recent Labs  Lab 09/01/24 0115 09/01/24 0739 09/01/24 1205  GLUCAP 133* 152* 159*    Recent Results (from the past 240 hours)  Resp panel by RT-PCR (RSV, Flu A&B, Covid) Anterior Nasal Swab     Status: None    Collection Time: 08/31/24 10:40 PM   Specimen: Anterior Nasal Swab  Result Value Ref Range Status   SARS Coronavirus 2 by RT PCR NEGATIVE NEGATIVE Final    Comment: (NOTE) SARS-CoV-2 target nucleic acids are NOT DETECTED.  The SARS-CoV-2 RNA is generally detectable in upper respiratory specimens during the acute phase of infection. The lowest concentration of SARS-CoV-2 viral copies this assay can detect is 138 copies/mL. A negative result does not preclude SARS-Cov-2 infection and should not be used as the sole basis for treatment or other patient management decisions. A negative result may occur with  improper specimen collection/handling, submission of specimen other than nasopharyngeal swab, presence of viral mutation(s) within the areas targeted by this assay, and inadequate number of viral copies(<138 copies/mL). A negative result must be combined with clinical observations, patient history, and epidemiological information. The expected result is Negative.  Fact Sheet for Patients:  bloggercourse.com  Fact Sheet for Healthcare Providers:  seriousbroker.it  This test is no t yet approved or cleared by the [place]  FDA and  has been authorized  for detection and/or diagnosis of SARS-CoV-2 by FDA under an Emergency Use Authorization (EUA). This EUA will remain  in effect (meaning this test can be used) for the duration of the COVID-19 declaration under Section 564(b)(1) of the Act, 21 U.S.C.section 360bbb-3(b)(1), unless the authorization is terminated  or revoked sooner.       Influenza A by PCR NEGATIVE NEGATIVE Final   Influenza B by PCR NEGATIVE NEGATIVE Final    Comment: (NOTE) The Xpert Xpress SARS-CoV-2/FLU/RSV plus assay is intended as an aid in the diagnosis of influenza from Nasopharyngeal swab specimens and should not be used as a sole basis for treatment. Nasal washings and aspirates are unacceptable for  Xpert Xpress SARS-CoV-2/FLU/RSV testing.  Fact Sheet for Patients: bloggercourse.com  Fact Sheet for Healthcare Providers: seriousbroker.it  This test is not yet approved or cleared by the [place]  FDA and has been authorized for detection and/or diagnosis of SARS-CoV-2 by FDA under an Emergency Use Authorization (EUA). This EUA will remain in effect (meaning this test can be used) for the duration of the COVID-19 declaration under Section 564(b)(1) of the Act, 21 U.S.C. section 360bbb-3(b)(1), unless the authorization is terminated or revoked.     Resp Syncytial Virus by PCR NEGATIVE NEGATIVE Final    Comment: (NOTE) Fact Sheet for Patients: bloggercourse.com  Fact Sheet for Healthcare Providers: seriousbroker.it  This test is not yet approved or cleared by the [place]  FDA and has been authorized for detection and/or diagnosis of SARS-CoV-2 by FDA under an Emergency Use Authorization (EUA). This EUA will remain in effect (meaning this test can be used) for the duration of the COVID-19 declaration under Section 564(b)(1) of the Act, 21 U.S.C. section 360bbb-3(b)(1), unless the authorization is terminated or revoked.  Performed at [facility], [address] [place], [place] [address]   Respiratory (~20 pathogens) panel by PCR     Status: None   Collection Time: 08/31/24 10:40 PM   Specimen: Nasopharyngeal Swab; Respiratory  Result Value Ref Range Status   Adenovirus NOT DETECTED NOT DETECTED Final   Coronavirus 229E NOT DETECTED NOT DETECTED Final    Comment: (NOTE) The Coronavirus on the Respiratory Panel, DOES NOT test for the novel  Coronavirus (2019 nCoV)    Coronavirus HKU1 NOT DETECTED NOT DETECTED Final   Coronavirus NL63 NOT DETECTED NOT DETECTED Final   Coronavirus OC43 NOT DETECTED NOT DETECTED Final   Metapneumovirus NOT DETECTED NOT  DETECTED Final   Rhinovirus / Enterovirus NOT DETECTED NOT DETECTED Final   Influenza A NOT DETECTED NOT DETECTED Final   Influenza B NOT DETECTED NOT DETECTED Final   Parainfluenza Virus 1 NOT DETECTED NOT DETECTED Final   Parainfluenza Virus 2 NOT DETECTED NOT DETECTED Final   Parainfluenza Virus 3 NOT DETECTED NOT DETECTED Final   Parainfluenza Virus 4 NOT DETECTED NOT DETECTED Final   Respiratory Syncytial Virus NOT DETECTED NOT DETECTED Final   Bordetella pertussis NOT DETECTED NOT DETECTED Final   Bordetella Parapertussis NOT DETECTED NOT DETECTED Final   Chlamydophila pneumoniae NOT DETECTED NOT DETECTED Final   Mycoplasma pneumoniae NOT DETECTED NOT DETECTED Final    Comment: Performed at [facility], [address], [place], [place] [address]     Radiology Studies: US  Renal Result Date: 08/31/2024 EXAM: RETROPERITONEAL ULTRASOUND OF THE KIDNEYS 08/31/2024 10:34:32 PM TECHNIQUE: Real-time ultrasonography of the retroperitoneum, specifically the kidneys and urinary bladder, was performed. COMPARISON: None available. CLINICAL HISTORY: Evaluate for hydronephrosis, AKI, unable to urinate for a  day. FINDINGS: RIGHT KIDNEY: Right kidney measures 9.4 x 4.3 x 3.8 cm with a volume of 77 cc. Renal cortical thickness and echogenicity are within normal limits. No hydronephrosis. No intrarenal mass or calcification. LEFT KIDNEY: Left kidney measures 9.4 x 4.9 x 4.9 cm with a volume of 118 cc. Renal cortical thickness and echogenicity are within normal limits. No hydronephrosis. No intrarenal mass or calcification. BLADDER: Unremarkable appearance of the bladder. Bilateral ureteral jets were identified. IMPRESSION: 1. No hydronephrosis or intrarenal mass. Electronically signed by: Dorethia Molt MD MD 08/31/2024 11:05 PM EST RP Workstation: [id number]   DG Chest 2 View Result Date: 08/31/2024 EXAM: 2 VIEW(S) XRAY OF THE CHEST 08/31/2024 08:11:50 PM COMPARISON: 04/27/2024 CLINICAL HISTORY: shob  FINDINGS: LINES, TUBES AND DEVICES: Partially visualized VP shunt coursing along left chest. LUNGS AND PLEURA: Redemonstration of coarse interstitial markings throughout bilateral lungs, unchanged from prior study, in keeping with changes of underlying interstitial lung disease that are seen on CT examination of 12/11/2023. No superimposed confluent pulmonary infiltrate. No pleural effusion. No pneumothorax. HEART AND MEDIASTINUM: No acute abnormality of the cardiac and mediastinal silhouettes. BONES AND SOFT TISSUES: No acute osseous abnormality. IMPRESSION: 1. No acute cardiopulmonary findings 2. Chronic coarse interstitial markings throughout the bilateral lungs, consistent with underlying interstitial lung disease. Electronically signed by: Dorethia Molt MD MD 08/31/2024 08:32 PM EST RP Workstation: [id number]    Scheduled Meds:  ARIPiprazole   5 mg Oral Daily   aspirin  EC  81 mg Oral Daily   atorvastatin   20 mg Oral q1800   clopidogrel   75 mg Oral Daily   diazepam   5 mg Oral Q12H   enoxaparin  (LOVENOX ) injection  40 mg Subcutaneous Q24H   Ensifentrine   1 Inhalation Inhalation Q1400   insulin  aspart  0-5 Units Subcutaneous QHS   insulin  aspart  0-9 Units Subcutaneous TID WC   ipratropium-albuterol   3 mL Inhalation QID   levothyroxine   100 mcg Oral Q0600   methylPREDNISolone  (SOLU-MEDROL ) injection  80 mg Intravenous Daily   metoprolol  succinate  12.5 mg Oral Daily   mirtazapine   45 mg Oral QHS   morphine   30 mg Oral Q12H   nerandomilast   18 mg Oral BID   pantoprazole   40 mg Oral BID AC   rOPINIRole   0.5 mg Oral QHS   tamsulosin   0.8 mg Oral QPC supper   venlafaxine  XR  150 mg Oral Q breakfast   Continuous Infusions:   LOS: 1 day  MDM: Patient is high risk for one or more organ failure.  They necessitate ongoing hospitalization for continued IV therapies and subsequent lab monitoring. Total time spent interpreting labs and vitals, reviewing the medical record, coordinating care amongst  consultants and care team members, directly assessing and discussing care with the patient and/or family: 55 min  Addis Bennie, DO [facility]  To contact the attending physician between 7A-7P please use Epic Chat. To contact the covering physician during after hours 7P-7A, please review Amion.  09/01/2024, 4:09 PM   *This document has been created with the assistance of dictation software. Please excuse typographical errors. *   "

## 2024-09-01 NOTE — ED Notes (Signed)
 This RN spoke with lab about an add on for respiratory swab.

## 2024-09-01 NOTE — Evaluation (Signed)
 Physical Therapy Evaluation Patient Details Name: Kirk Robinson MRN: [id number] DOB: 02/12/1956 Today's Date: 09/01/2024  History of Present Illness  69 y/o male presented to ED on 08/31/24 for SOB x 2 days. Admitted for acute on chronic respiratory failure with hypoxia 2/2 COPD exacerbation. 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 PTA, patient lives with wife, daughter, and grandchildren and reports being a limited household ambulator with use of rollator vs SPC and chronically on 4L O2. Patient presents with weakness, impaired balance, and decreased activity tolerance. Able to complete bed mobility with minA and stood from EOB with minA+2 and HHAx2. Upon standing, patient feeling weak and unable to progress >1-2 steps at EOB. BP assessed with positional changes, see below. Patient will benefit from skilled PT services during acute stay to address listed deficits. Patient will benefit from ongoing therapy at discharge to maximize functional independence and safety.   Orthostatic BPs  Sitting 99/77   Standing 89/70  Supine 121/67          If plan is discharge home, recommend the following: A lot of help with walking and/or transfers;A lot of help with bathing/dressing/bathroom;Assistance with cooking/housework;Assist for transportation;Help with stairs or ramp for entrance   Can travel by private vehicle   Yes    Equipment Recommendations Other (comment) (TBD)  Recommendations for Other Services       Functional Status Assessment Patient has had a recent decline in their functional status and demonstrates the ability to make significant improvements in function in a reasonable and predictable amount of time.     Precautions / Restrictions Precautions Precautions: Fall Recall of Precautions/Restrictions:  Intact Precaution/Restrictions Comments: watch O2 Restrictions Weight Bearing Restrictions Per Provider Order: No      Mobility  Bed Mobility Overal bed mobility: Needs Assistance Bed Mobility: Supine to Sit, Sit to Supine     Supine to sit: Min assist Sit to supine: Min assist        Transfers Overall transfer level: Needs assistance Equipment used: 2 person hand held assist Transfers: Sit to/from Stand Sit to Stand: Min assist, +2 safety/equipment, From elevated surface           General transfer comment: assist to steady. Patient feeling weak upon standing. Unable to perform >1-2 steps at EOB. Tremulous in standing    Ambulation/Gait                  Stairs            Wheelchair Mobility     Tilt Bed    Modified Rankin (Stroke Patients Only)       Balance Overall balance assessment: Needs assistance Sitting-balance support: Feet supported, Bilateral upper extremity supported Sitting balance-Leahy Scale: Fair     Standing balance support: Bilateral upper extremity supported, During functional activity Standing balance-Leahy Scale: Poor                               Pertinent Vitals/Pain Pain Assessment Pain Assessment: Faces Faces Pain Scale: Hurts little more Pain Location: generalized Pain Descriptors / Indicators: Discomfort Pain Intervention(s): Limited activity within patient's tolerance, Monitored during session, Repositioned    Home Living Family/patient expects to be discharged to:: Private residence Living Arrangements: Spouse/significant other;Children;Other relatives (Wife (retired), DTR (home all the time), 2 grandchildren  12yo and 16yo.) Available Help at Discharge: Family Type of Home: House [facility]) Home Access: Level entry       Home Layout: Two level;Able to live on main level with bedroom/bathroom Home Equipment: Grab bars - tub/shower;Grab bars - toilet;Shower Counsellor (2 wheels);Cane  - single point;Rollator (4 wheels)      Prior Function Prior Level of Function : Needs assist             Mobility Comments: limited household AMB with 4L O2 and 4WW or SPC ADLs Comments: wife assists with ADLs as needed     Extremity/Trunk Assessment   Upper Extremity Assessment Upper Extremity Assessment: Defer to OT evaluation    Lower Extremity Assessment Lower Extremity Assessment: Generalized weakness       Communication   Communication Communication: No apparent difficulties    Cognition Arousal: Alert Behavior During Therapy: WFL for tasks assessed/performed   PT - Cognitive impairments: No family/caregiver present to determine baseline                       PT - Cognition Comments: seemed WFL Following commands: Intact       Cueing       General Comments General comments (skin integrity, edema, etc.): Sitting 99/77; Standing 89/70; Supine 121/67 - on 4L O2 throughout with spO2>90%    Exercises     Assessment/Plan    PT Assessment Patient needs continued PT services  PT Problem List Decreased strength;Decreased activity tolerance;Decreased balance;Decreased mobility;Decreased coordination;Decreased knowledge of use of DME;Decreased safety awareness;Cardiopulmonary status limiting activity;Decreased knowledge of precautions;Pain       PT Treatment Interventions DME instruction;Gait training;Therapeutic activities;Functional mobility training;Balance training;Therapeutic exercise;Neuromuscular re-education;Patient/family education    PT Goals (Current goals can be found in the Care Plan section)  Acute Rehab PT Goals Patient Stated Goal: to get stronger PT Goal Formulation: With patient Time For Goal Achievement: 09/15/24 Potential to Achieve Goals: Good    Frequency Min 2X/week     Co-evaluation PT/OT/SLP Co-Evaluation/Treatment: Yes Reason for Co-Treatment: For patient/therapist safety;To address functional/ADL transfers PT  goals addressed during session: Balance;Mobility/safety with mobility         AM-PAC PT 6 Clicks Mobility  Outcome Measure Help needed turning from your back to your side while in a flat bed without using bedrails?: A Little Help needed moving from lying on your back to sitting on the side of a flat bed without using bedrails?: A Little Help needed moving to and from a bed to a chair (including a wheelchair)?: A Little Help needed standing up from a chair using your arms (e.g., wheelchair or bedside chair)?: A Little Help needed to walk in hospital room?: A Lot Help needed climbing 3-5 steps with a railing? : Total 6 Click Score: 15    End of Session Equipment Utilized During Treatment: Oxygen Activity Tolerance: Patient limited by fatigue Patient left: in bed;with call bell/phone within reach Nurse Communication: Mobility status PT Visit Diagnosis: Unsteadiness on feet (R26.81);Muscle weakness (generalized) (M62.81);Other abnormalities of gait and mobility (R26.89)    Time: 8652-8596 PT Time Calculation (min) (ACUTE ONLY): 16 min   Charges:   PT Evaluation $PT Eval Moderate Complexity: 1 Mod   PT General Charges $$ ACUTE PT VISIT: 1 Visit         Maryanne Finder, PT, DPT Physical Therapist - [facility]  [facility]  Amandalee Lacap A Jayce Kainz 09/01/2024, 3:05 PM

## 2024-09-01 NOTE — Progress Notes (Signed)
" ° °  Brief Progress Note   _____________________________________________________________________________________________________________  Patient Name: Kirk Robinson Patient DOB: Nov 19, 1955 Date: @TODAY @      Data: Reviewed labs, notes, VS.    Action: No action needed at this time.      Response:    _____________________________________________________________________________________________________________  [facility] RN Expeditor Sharolyn JONETTA Batman Please contact us  directly via secure chat (search for [facility]) or by calling us  at [phone] [facility]).  "

## 2024-09-01 NOTE — Evaluation (Signed)
 Occupational Therapy Evaluation Patient Details Name: Kirk Robinson MRN: [id number] DOB: April 14, 1956 Today's Date: 09/01/2024   History of Present Illness   69 y/o male presented to ED on 08/31/24 for SOB x 2 days. Admitted for acute on chronic respiratory failure with hypoxia 2/2 COPD exacerbation. 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 Prior to hospital admission, patient was receiving A for basic ADLs form spouse, level of A varies based on pain level. They have recently moved into level entry townhome with daughter and grand children. He is on 4L at baseline and uses rollator for household mobility. Patient transitioned to EOB with min A, sit<>stand x 2 min A and able to take minimal side steps to [facility], reports feeling weak (see PT note for orthostatic BP readings). Patient was transitioned to supine with min A. Patient tremulous throughout eval. Patient presents with deficits in standing balance/tolerance, gross strength and overall activity tolerance, affecting safe and optimal ADL completion. Patient is currently requiring max A for LB ADLs.  Patient would benefit from skilled OT services to address noted impairments and functional limitations (see below for any additional details) in order to maximize safety and independence while minimizing future risk of falls, injury, and readmission. Anticipate the need for follow up OT services upon acute hospital DC.      If plan is discharge home, recommend the following:   A lot of help with walking and/or transfers;A lot of help with bathing/dressing/bathroom     Functional Status Assessment   Patient has had a recent decline in their functional status and demonstrates the ability to make significant improvements in function in a reasonable and predictable  amount of time.     Equipment Recommendations   None recommended by OT     Recommendations for Other Services         Precautions/Restrictions   Precautions Precautions: Fall Recall of Precautions/Restrictions: Intact Precaution/Restrictions Comments: watch O2 Restrictions Weight Bearing Restrictions Per Provider Order: No     Mobility Bed Mobility Overal bed mobility: Needs Assistance Bed Mobility: Supine to Sit, Sit to Supine     Supine to sit: Min assist Sit to supine: Min assist        Transfers Overall transfer level: Needs assistance Equipment used: 2 person hand held assist Transfers: Sit to/from Stand Sit to Stand: Min assist, +2 safety/equipment, From elevated surface           General transfer comment: assist to steady. Patient feeling weak upon standing. Unable to perform >1-2 steps at EOB. Tremulous in standing      Balance Overall balance assessment: Needs assistance Sitting-balance support: Feet supported, Bilateral upper extremity supported Sitting balance-Leahy Scale: Fair     Standing balance support: Bilateral upper extremity supported, During functional activity Standing balance-Leahy Scale: Poor                             ADL either performed or assessed with clinical judgement   ADL Overall ADL's : Needs assistance/impaired                                       General ADL Comments: anticipate requiring mod-max A for LB self  care tasks due to poor activity tolerance     Vision         Perception         Praxis         Pertinent Vitals/Pain Pain Assessment Pain Assessment: Faces Faces Pain Scale: Hurts little more Pain Location: generalized Pain Descriptors / Indicators: Discomfort Pain Intervention(s): Limited activity within patient's tolerance, Monitored during session     Extremity/Trunk Assessment Upper Extremity Assessment Upper Extremity Assessment: Generalized  weakness   Lower Extremity Assessment Lower Extremity Assessment: Generalized weakness       Communication Communication Communication: No apparent difficulties   Cognition Arousal: Alert Behavior During Therapy: WFL for tasks assessed/performed Cognition: No apparent impairments                               Following commands: Intact       Cueing  General Comments   Cueing Techniques: Verbal cues  Sitting 99/77; Standing 89/70; Supine 121/67 - on 4L O2 throughout with spO2>90%   Exercises     Shoulder Instructions      Home Living Family/patient expects to be discharged to:: Private residence Living Arrangements: Spouse/significant other;Children;Other relatives Available Help at Discharge: Family Type of Home: Other(Comment) (townhome) Home Access: Level entry     Home Layout: Two level;Able to live on main level with bedroom/bathroom     Bathroom Shower/Tub: Producer, Television/film/video: Handicapped height     Home Equipment: Grab bars - tub/shower;Grab bars - toilet;Shower Counsellor (2 wheels);Cane - single point;Rollator (4 wheels)          Prior Functioning/Environment Prior Level of Function : Needs assist             Mobility Comments: limited household AMB with 4L O2 and 4WW or SPC ADLs Comments: wife assists with ADLs as needed    OT Problem List: Decreased strength;Decreased activity tolerance;Impaired balance (sitting and/or standing);Decreased cognition;Decreased safety awareness   OT Treatment/Interventions: Self-care/ADL training;Therapeutic exercise;Energy conservation;DME and/or AE instruction;Balance training      OT Goals(Current goals can be found in the care plan section)   Acute Rehab OT Goals Patient Stated Goal: to go home OT Goal Formulation: With patient Time For Goal Achievement: 09/15/24 Potential to Achieve Goals: Fair ADL Goals Pt Will Perform Grooming: with supervision;with  set-up;standing;sitting Pt Will Perform Lower Body Dressing: with min assist;sit to/from stand Pt Will Transfer to Toilet: with min assist;with contact guard assist;ambulating;bedside commode Pt Will Perform Toileting - Clothing Manipulation and hygiene: with contact guard assist;with min assist;sit to/from stand   OT Frequency:  Min 2X/week    Co-evaluation PT/OT/SLP Co-Evaluation/Treatment: Yes Reason for Co-Treatment: For patient/therapist safety;To address functional/ADL transfers PT goals addressed during session: Balance;Mobility/safety with mobility OT goals addressed during session: ADL's and self-care      AM-PAC OT 6 Clicks Daily Activity     Outcome Measure Help from another person eating meals?: None Help from another person taking care of personal grooming?: A Little Help from another person toileting, which includes using toliet, bedpan, or urinal?: A Little Help from another person bathing (including washing, rinsing, drying)?: A Lot Help from another person to put on and taking off regular upper body clothing?: A Little Help from another person to put on and taking off regular lower body clothing?: A Lot 6 Click Score: 17   End of Session Equipment Utilized During Treatment: Oxygen Nurse Communication: Mobility status  Activity Tolerance: Patient limited by fatigue;Patient limited by pain Patient left: in bed;with call bell/phone within reach  OT Visit Diagnosis: Unsteadiness on feet (R26.81);Repeated falls (R29.6);Muscle weakness (generalized) (M62.81);History of falling (Z91.81)                Time: 8652-8596 OT Time Calculation (min): 16 min Charges:  OT General Charges $OT Visit: 1 Visit OT Evaluation $OT Eval Low Complexity: 1 Low  Rogers Clause, OT/L MSOT, 09/01/2024

## 2024-09-02 DIAGNOSIS — J441 Chronic obstructive pulmonary disease with (acute) exacerbation: Secondary | ICD-10-CM | POA: Diagnosis not present

## 2024-09-02 DIAGNOSIS — N179 Acute kidney failure, unspecified: Secondary | ICD-10-CM | POA: Diagnosis not present

## 2024-09-02 DIAGNOSIS — J9621 Acute and chronic respiratory failure with hypoxia: Secondary | ICD-10-CM | POA: Diagnosis not present

## 2024-09-02 DIAGNOSIS — N1831 Chronic kidney disease, stage 3a: Secondary | ICD-10-CM | POA: Diagnosis not present

## 2024-09-02 DIAGNOSIS — I482 Chronic atrial fibrillation, unspecified: Secondary | ICD-10-CM | POA: Diagnosis not present

## 2024-09-02 DIAGNOSIS — G894 Chronic pain syndrome: Secondary | ICD-10-CM | POA: Diagnosis not present

## 2024-09-02 LAB — CBC WITH DIFFERENTIAL/PLATELET
Abs Immature Granulocytes: 0.09 K/uL — ABNORMAL HIGH (ref 0.00–0.07)
Basophils Absolute: 0 K/uL (ref 0.0–0.1)
Basophils Relative: 0 %
Eosinophils Absolute: 0.1 K/uL (ref 0.0–0.5)
Eosinophils Relative: 1 %
HCT: 32.4 % — ABNORMAL LOW (ref 39.0–52.0)
Hemoglobin: 10.9 g/dL — ABNORMAL LOW (ref 13.0–17.0)
Immature Granulocytes: 1 %
Lymphocytes Relative: 16 %
Lymphs Abs: 1.7 K/uL (ref 0.7–4.0)
MCH: 30.7 pg (ref 26.0–34.0)
MCHC: 33.6 g/dL (ref 30.0–36.0)
MCV: 91.3 fL (ref 80.0–100.0)
Monocytes Absolute: 0.4 K/uL (ref 0.1–1.0)
Monocytes Relative: 4 %
Neutro Abs: 8.4 K/uL — ABNORMAL HIGH (ref 1.7–7.7)
Neutrophils Relative %: 78 %
Platelets: 220 K/uL (ref 150–400)
RBC: 3.55 MIL/uL — ABNORMAL LOW (ref 4.22–5.81)
RDW: 13.4 % (ref 11.5–15.5)
WBC: 10.7 K/uL — ABNORMAL HIGH (ref 4.0–10.5)
nRBC: 0 % (ref 0.0–0.2)

## 2024-09-02 LAB — COMPREHENSIVE METABOLIC PANEL WITH GFR
ALT: 8 U/L (ref 0–44)
AST: 14 U/L — ABNORMAL LOW (ref 15–41)
Albumin: 4 g/dL (ref 3.5–5.0)
Alkaline Phosphatase: 51 U/L (ref 38–126)
Anion gap: 11 (ref 5–15)
BUN: 37 mg/dL — ABNORMAL HIGH (ref 8–23)
CO2: 30 mmol/L (ref 22–32)
Calcium: 9.3 mg/dL (ref 8.9–10.3)
Chloride: 97 mmol/L — ABNORMAL LOW (ref 98–111)
Creatinine, Ser: 1.63 mg/dL — ABNORMAL HIGH (ref 0.61–1.24)
GFR, Estimated: 46 mL/min — ABNORMAL LOW
Glucose, Bld: 181 mg/dL — ABNORMAL HIGH (ref 70–99)
Potassium: 3.9 mmol/L (ref 3.5–5.1)
Sodium: 138 mmol/L (ref 135–145)
Total Bilirubin: 0.4 mg/dL (ref 0.0–1.2)
Total Protein: 6.7 g/dL (ref 6.5–8.1)

## 2024-09-02 LAB — CBG MONITORING, ED
Glucose-Capillary: 115 mg/dL — ABNORMAL HIGH (ref 70–99)
Glucose-Capillary: 154 mg/dL — ABNORMAL HIGH (ref 70–99)
Glucose-Capillary: 259 mg/dL — ABNORMAL HIGH (ref 70–99)
Glucose-Capillary: 131 mg/dL — ABNORMAL HIGH (ref 70–99)

## 2024-09-02 LAB — TROPONIN T, HIGH SENSITIVITY: Troponin T High Sensitivity: 22 ng/L — ABNORMAL HIGH (ref 0–19)

## 2024-09-02 NOTE — ED Notes (Signed)
 Pt eating breakfast at this time.

## 2024-09-02 NOTE — ED Notes (Signed)
 Cup of ice provided per patient request.

## 2024-09-02 NOTE — Progress Notes (Signed)
 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  He presented with shortness of breath which has been progressively worsening over the 2 days prior to arrival. On arrival patient was found to be in acute respiratory distress, difficulty speaking in full sentences, and increasing oxygen requirements.  Labs revealed worsening renal function.  Negative RVP, vitals consistent with tachycardia and hypoxemia requiring 6 L [place]. CXR showed chronic interstitial lung disease without infiltration.  He was admitted to progressive care unit for treatment of COPD exacerbation  Subjective: No acute events overnight.  Patient reports he is feeling mildly improved today.  He is anxious to get up and ambulate.  Objective: Vitals:   09/02/24 1300 09/02/24 1400 09/02/24 1424 09/02/24 1600  BP: 98/68 109/78    Pulse: 88 (!) 104  96  Resp: 13 20  17   Temp:   98.2 F (36.8 C)   TempSrc:   Oral   SpO2: 100% 96%  100%  Weight:      Height:        Intake/Output Summary (Last 24 hours) at 09/02/2024 1645 Last data filed at 09/02/2024 1000 Gross per 24 hour  Intake --  Output 3550 ml  Net -3550 ml   Filed Weights   08/31/24 1938  Weight: 78 kg    Examination: General exam: Appears calm and comfortable, NAD  Respiratory system: No work of breathing, poor aeration bilaterally, currently on 4 L [place] Cardiovascular system: S1 & S2 heard, RRR.  Gastrointestinal system: Abdomen is nondistended, soft and nontender.  Neuro: Alert and  oriented.  Assessment & Plan:  Principal Problem:   Acute on chronic respiratory failure with hypoxia (HCC) Active Problems:   COPD exacerbation (HCC)   Pulmonary fibrosis (HCC)   Atrial fibrillation, chronic (HCC)   Myocardial injury   Acute renal failure superimposed on stage 3a chronic kidney disease (HCC)   Chronic diastolic CHF (congestive heart failure) (HCC)   Type II diabetes mellitus with renal manifestations (HCC)   Hyperlipidemia   Stroke (HCC)   Hypothyroidism   Rheumatoid arthritis (HCC)   Chronic pain syndrome   Anxiety and depression    Acute on chronic respiratory failure with hypoxia due to COPD exacerbation pulmonary fibrosis - Increased O2 requirement, chronically on 4 L.  Requiring 6 L with respiratory distress on arrival - Continue with steroids, bronchodilators, Mucinex  - Continue home Ensifntrine, Jascayd -family has been made aware this is nonformulary and they will provide home meds - Encourage I-S - RVP is negative - No evidence of infection at this time requiring antibiotics - Worsening leukocytosis expected secondary to steroids - Continue PT/OT  A-fib, chronic - Heart rate on arrival 103 has resolved.  Rate controlled now. - Not on anticoagulation at home - Continue on telemetry.  Continue with home dose metoprolol   Elevated troponin - Troponin 26->36 on repeat - No chest pain. Likely demand ischemia in setting of hypoxia - Continue home aspirin , Plavix , statin  Chronic diastolic CHF - Echocardiogram 7974: EF 50 to 55%, grade 1 diastolic dysfunction - Clinically euvolemic to dry -  Monitor volume status closely - Hold Lasix given current AKI  Acute renal failure superimposed on stage IIIa CKD - Baseline creatinine 1.35, peaked at 2.10 - Downtrending now - Appears secondary to dehydration.  Has had decreased urinary output - Renal ultrasound negative for obstruction or retention - Did receive 500 cc bolus - Hold further diuretics for  now - UA: Not consistent with infection  Hyperkalemia - Potassium 5.2, treated.  Repeat in a.m.  Hypothyroidism - Continue Synthroid   Type 2 diabetes with renal manifestations - Hemoglobin A1c 6.1%, well-controlled - Takes metformin  at home - Likely to develop hyperglycemia due to steroids while admitted.  Continue with sliding scale insulin  for now  History of CVA - Continue aspirin , Plavix , statin  Hyperlipidemia - Continue statin  Chronic pain - At home is on MS Contin  30 mg twice daily, continue this - PDMP reviewed  Anxiety Depression - Continue home meds: Effexor , Requip , Valium , Abilify   BPH Acute urinary retention - Foley catheter inserted on 1/11, will trial of void the patient is more ambulatory. - Continue Flomax   Rheumatoid arthritis - Daily prednisone  10 mg and monthly Cimzia injections - Follow outpatient with rheumatology - Currently on higher dose steroids as above.  Will gradually taper to home dose at time of discharge - Continue with PT/OT  Body mass index is 25.4 kg/m. - Outpatient follow up for lifestyle modification and risk factor management   DVT prophylaxis: Lovenox    Code Status: Full Code Disposition: Pending clinical improvement  Consultants:    Procedures:    Antimicrobials:  Anti-infectives (From admission, onward)    None       Data Reviewed: I have personally reviewed following labs and imaging studies CBC: Recent Labs  Lab 08/31/24 1940 09/01/24 0610 09/02/24 0930  WBC 10.3 8.1 10.7*  NEUTROABS  --   --  8.4*  HGB 12.5* 11.0* 10.9*  HCT 38.1* 35.2* 32.4*  MCV 92.9 97.2 91.3  PLT 285 208 220   Basic Metabolic Panel: Recent Labs  Lab 08/31/24 1940 09/01/24 0610 09/02/24 0930  NA 137 138 138  K 4.7 5.2* 3.9  CL 98 96* 97*  CO2 20* 18* 30  GLUCOSE 175* 173* 181*  BUN 33* 35* 37*  CREATININE 1.88* 2.10* 1.63*  CALCIUM  9.4 9.0 9.3   GFR: Estimated Creatinine Clearance: 44.6 mL/min (A) (by C-G  formula based on SCr of 1.63 mg/dL (H)). Liver Function Tests: Recent Labs  Lab 09/02/24 0930  AST 14*  ALT 8  ALKPHOS 51  BILITOT 0.4  PROT 6.7  ALBUMIN 4.0   CBG: Recent Labs  Lab 09/01/24 1631 09/01/24 2247 09/02/24 0719 09/02/24 1103 09/02/24 1627  GLUCAP 209* 191* 115* 154* 259*    Recent Results (from the past 240 hours)  Resp panel by RT-PCR (RSV, Flu A&B, Covid) Anterior Nasal Swab     Status: None   Collection Time: 08/31/24 10:40 PM   Specimen: Anterior Nasal Swab  Result Value Ref Range Status   SARS Coronavirus 2 by RT PCR NEGATIVE NEGATIVE Final    Comment: (NOTE) SARS-CoV-2 target nucleic acids are NOT DETECTED.  The SARS-CoV-2 RNA is generally detectable in upper respiratory specimens during the acute phase of infection. The lowest concentration of SARS-CoV-2 viral copies this assay can detect is 138 copies/mL. A negative result does not preclude SARS-Cov-2 infection and should not be used as the sole basis for treatment or other patient management decisions. A negative result may occur with  improper specimen collection/handling, submission  of specimen other than nasopharyngeal swab, presence of viral mutation(s) within the areas targeted by this assay, and inadequate number of viral copies(<138 copies/mL). A negative result must be combined with clinical observations, patient history, and epidemiological information. The expected result is Negative.  Fact Sheet for Patients:  bloggercourse.com  Fact Sheet for Healthcare Providers:  seriousbroker.it  This test is no t yet approved or cleared by the [place]  FDA and  has been authorized for detection and/or diagnosis of SARS-CoV-2 by FDA under an Emergency Use Authorization (EUA). This EUA will remain  in effect (meaning this test can be used) for the duration of the COVID-19 declaration under Section 564(b)(1) of the Act, 21 U.S.C.section  360bbb-3(b)(1), unless the authorization is terminated  or revoked sooner.       Influenza A by PCR NEGATIVE NEGATIVE Final   Influenza B by PCR NEGATIVE NEGATIVE Final    Comment: (NOTE) The Xpert Xpress SARS-CoV-2/FLU/RSV plus assay is intended as an aid in the diagnosis of influenza from Nasopharyngeal swab specimens and should not be used as a sole basis for treatment. Nasal washings and aspirates are unacceptable for Xpert Xpress SARS-CoV-2/FLU/RSV testing.  Fact Sheet for Patients: bloggercourse.com  Fact Sheet for Healthcare Providers: seriousbroker.it  This test is not yet approved or cleared by the [place]  FDA and has been authorized for detection and/or diagnosis of SARS-CoV-2 by FDA under an Emergency Use Authorization (EUA). This EUA will remain in effect (meaning this test can be used) for the duration of the COVID-19 declaration under Section 564(b)(1) of the Act, 21 U.S.C. section 360bbb-3(b)(1), unless the authorization is terminated or revoked.     Resp Syncytial Virus by PCR NEGATIVE NEGATIVE Final    Comment: (NOTE) Fact Sheet for Patients: bloggercourse.com  Fact Sheet for Healthcare Providers: seriousbroker.it  This test is not yet approved or cleared by the [place]  FDA and has been authorized for detection and/or diagnosis of SARS-CoV-2 by FDA under an Emergency Use Authorization (EUA). This EUA will remain in effect (meaning this test can be used) for the duration of the COVID-19 declaration under Section 564(b)(1) of the Act, 21 U.S.C. section 360bbb-3(b)(1), unless the authorization is terminated or revoked.  Performed at [facility], [address] [place], [place] [address]   Respiratory (~20 pathogens) panel by PCR     Status: None   Collection Time: 08/31/24 10:40 PM   Specimen: Nasopharyngeal Swab; Respiratory   Result Value Ref Range Status   Adenovirus NOT DETECTED NOT DETECTED Final   Coronavirus 229E NOT DETECTED NOT DETECTED Final    Comment: (NOTE) The Coronavirus on the Respiratory Panel, DOES NOT test for the novel  Coronavirus (2019 nCoV)    Coronavirus HKU1 NOT DETECTED NOT DETECTED Final   Coronavirus NL63 NOT DETECTED NOT DETECTED Final   Coronavirus OC43 NOT DETECTED NOT DETECTED Final   Metapneumovirus NOT DETECTED NOT DETECTED Final   Rhinovirus / Enterovirus NOT DETECTED NOT DETECTED Final   Influenza A NOT DETECTED NOT DETECTED Final   Influenza B NOT DETECTED NOT DETECTED Final   Parainfluenza Virus 1 NOT DETECTED NOT DETECTED Final   Parainfluenza Virus 2 NOT DETECTED NOT DETECTED Final   Parainfluenza Virus 3 NOT DETECTED NOT DETECTED Final   Parainfluenza Virus 4 NOT DETECTED NOT DETECTED Final   Respiratory Syncytial Virus NOT DETECTED NOT DETECTED Final   Bordetella pertussis NOT DETECTED NOT DETECTED Final   Bordetella Parapertussis NOT DETECTED NOT DETECTED Final   Chlamydophila pneumoniae NOT  DETECTED NOT DETECTED Final   Mycoplasma pneumoniae NOT DETECTED NOT DETECTED Final    Comment: Performed at [facility], [address], [place], [place] [address]     Radiology Studies: US  Renal Result Date: 08/31/2024 EXAM: RETROPERITONEAL ULTRASOUND OF THE KIDNEYS 08/31/2024 10:34:32 PM TECHNIQUE: Real-time ultrasonography of the retroperitoneum, specifically the kidneys and urinary bladder, was performed. COMPARISON: None available. CLINICAL HISTORY: Evaluate for hydronephrosis, AKI, unable to urinate for a day. FINDINGS: RIGHT KIDNEY: Right kidney measures 9.4 x 4.3 x 3.8 cm with a volume of 77 cc. Renal cortical thickness and echogenicity are within normal limits. No hydronephrosis. No intrarenal mass or calcification. LEFT KIDNEY: Left kidney measures 9.4 x 4.9 x 4.9 cm with a volume of 118 cc. Renal cortical thickness and echogenicity are within normal limits. No  hydronephrosis. No intrarenal mass or calcification. BLADDER: Unremarkable appearance of the bladder. Bilateral ureteral jets were identified. IMPRESSION: 1. No hydronephrosis or intrarenal mass. Electronically signed by: Dorethia Molt MD MD 08/31/2024 11:05 PM EST RP Workstation: [id number]   DG Chest 2 View Result Date: 08/31/2024 EXAM: 2 VIEW(S) XRAY OF THE CHEST 08/31/2024 08:11:50 PM COMPARISON: 04/27/2024 CLINICAL HISTORY: shob FINDINGS: LINES, TUBES AND DEVICES: Partially visualized VP shunt coursing along left chest. LUNGS AND PLEURA: Redemonstration of coarse interstitial markings throughout bilateral lungs, unchanged from prior study, in keeping with changes of underlying interstitial lung disease that are seen on CT examination of 12/11/2023. No superimposed confluent pulmonary infiltrate. No pleural effusion. No pneumothorax. HEART AND MEDIASTINUM: No acute abnormality of the cardiac and mediastinal silhouettes. BONES AND SOFT TISSUES: No acute osseous abnormality. IMPRESSION: 1. No acute cardiopulmonary findings 2. Chronic coarse interstitial markings throughout the bilateral lungs, consistent with underlying interstitial lung disease. Electronically signed by: Dorethia Molt MD MD 08/31/2024 08:32 PM EST RP Workstation: [id number]    Scheduled Meds:  ARIPiprazole   5 mg Oral Daily   aspirin  EC  81 mg Oral Daily   atorvastatin   20 mg Oral q1800   Chlorhexidine  Gluconate Cloth  6 each Topical Daily   clopidogrel   75 mg Oral Daily   diazepam   5 mg Oral Q12H   enoxaparin  (LOVENOX ) injection  40 mg Subcutaneous Q24H   Ensifentrine   1 Inhalation Inhalation Q1400   insulin  aspart  0-5 Units Subcutaneous QHS   insulin  aspart  0-9 Units Subcutaneous TID WC   ipratropium-albuterol   3 mL Inhalation QID   levothyroxine   100 mcg Oral Q0600   methylPREDNISolone  (SOLU-MEDROL ) injection  80 mg Intravenous Daily   metoprolol  succinate  12.5 mg Oral Daily   mirtazapine   45 mg Oral QHS   morphine   30  mg Oral Q12H   nerandomilast   18 mg Oral BID   pantoprazole   40 mg Oral BID AC   rOPINIRole   0.5 mg Oral QHS   tamsulosin   0.8 mg Oral QPC supper   venlafaxine  XR  150 mg Oral Q breakfast   Continuous Infusions:   LOS: 2 days  MDM: Patient is high risk for one or more organ failure.  They necessitate ongoing hospitalization for continued IV therapies and subsequent lab monitoring. Total time spent interpreting labs and vitals, reviewing the medical record, coordinating care amongst consultants and care team members, directly assessing and discussing care with the patient and/or family: 55 min  Kirk Isidore, DO [facility]  To contact the attending physician between 7A-7P please use Epic Chat. To contact the covering physician during after hours 7P-7A, please review Amion.  09/02/2024, 4:45 PM   *  This document has been created with the assistance of dictation software. Please excuse typographical errors. *   "

## 2024-09-02 NOTE — ED Notes (Signed)
 Admitted pt to [facility]

## 2024-09-02 NOTE — ED Notes (Addendum)
 Pt eating lunch, reports chronic pain only. Pt remains on 4 liter's [place].

## 2024-09-02 NOTE — ED Notes (Signed)
 Lunch tray provided to patient; wife at bedside, states she will help set up tray.  Patient able to feed self.

## 2024-09-02 NOTE — ED Notes (Signed)
 Called wife [name]. States that she will bring pt's jascayd  and ensifentrine  inhaler to the hospital since pharmacy does not carry these medications.

## 2024-09-02 NOTE — ED Notes (Signed)
 Pt assisted up to bedside commode, attempted to have BM but was unsuccessful. Pt back into bed, denies any additional needs at this time, bed in lowest locked position, call bell within reach

## 2024-09-03 LAB — GLUCOSE, CAPILLARY
Glucose-Capillary: 130 mg/dL — ABNORMAL HIGH (ref 70–99)
Glucose-Capillary: 269 mg/dL — ABNORMAL HIGH (ref 70–99)
Glucose-Capillary: 267 mg/dL — ABNORMAL HIGH (ref 70–99)
Glucose-Capillary: 135 mg/dL — ABNORMAL HIGH (ref 70–99)

## 2024-09-03 MED ORDER — IPRATROPIUM-ALBUTEROL 0.5-2.5 (3) MG/3ML IN SOLN
3.0000 mL | Freq: Three times a day (TID) | RESPIRATORY_TRACT | Status: DC
  Administered 2024-09-04 (×2): 3 mL via RESPIRATORY_TRACT
  Filled 2024-09-03 (×2): qty 3

## 2024-09-03 MED ORDER — IPRATROPIUM-ALBUTEROL 0.5-2.5 (3) MG/3ML IN SOLN
3.0000 mL | Freq: Four times a day (QID) | RESPIRATORY_TRACT | Status: DC
  Administered 2024-09-03 (×2): 3 mL via RESPIRATORY_TRACT
  Filled 2024-09-03: qty 3

## 2024-09-03 NOTE — Progress Notes (Signed)
 Physical Therapy Treatment Patient Details Name: Kirk Robinson MRN: [id number] DOB: 11-20-1955 Today's Date: 09/03/2024   History of Present Illness 69 y/o male presented to ED on 08/31/24 for SOB x 2 days. Admitted for acute on chronic respiratory failure with hypoxia 2/2 COPD exacerbation. 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 He is alert and endorsing feelingA lot better than yesterday. Agrees to session and remains pleasant throughout. Resting HR ~ 99 bpm that only elevated to 117 bpm with ambulation and OOB activity. Pt's BP in bed 105/74 (84) ,then in sitting 94/76 (83),and then in standing 94/74. Pt did not endorse dizziness throughout session but does state he still feels weak He tolerated ambulation with rollator ~ 100 ft prior to requesting seated rest. Confirmed with spouse at conclusion of session via phone that he does not ambulate far at baseline. Both pt and spouse prefer dcing home with [facility] once deemed medically stable.    If plan is discharge home, recommend the following: A little help with walking and/or transfers;A little help with bathing/dressing/bathroom;Assistance with cooking/housework;Direct supervision/assist for medications management;Direct supervision/assist for financial management;Assist for transportation;Help with stairs or ramp for entrance;Supervision due to cognitive status     Equipment Recommendations  Wheelchair (measurements PT);Wheelchair cushion (measurements PT) (For longer distances. per pt has rollator and RW already)       Precautions / Restrictions Precautions Precautions: Fall Recall of Precautions/Restrictions: Intact Precaution/Restrictions Comments: watch O2 Restrictions Weight Bearing Restrictions Per Provider Order: No     Mobility  Bed Mobility Overal bed  mobility: Needs Assistance Bed Mobility: Supine to Sit, Sit to Supine  Supine to sit: Min assist Sit to supine: Supervision General bed mobility comments: increased time to exit and then later to re-enter bed. He did require min assist to safely exit the bed. no physical assist to return to supine and I'ly move to [facility].    Transfers Overall transfer level: Needs assistance Equipment used: Rollator (4 wheels) Transfers: Sit to/from Stand Sit to Stand: Contact guard assist  General transfer comment: CGA for safety. vcs for increased fwd wt shift. no physical lifting assist needed.    Ambulation/Gait Ambulation/Gait assistance: Contact guard assist Gait Distance (Feet): 100 Feet Assistive device: Rollator (4 wheels) Gait Pattern/deviations: Step-through pattern Gait velocity: decreased  General Gait Details: Pt was able to ambulate ~ 100 ft total on 4 L o2 without LOB. Distance limited by fatigue however spouse confirmed that he does not ambulate very far at baseline. She feels pt will do better with home + HH than going to STR   Balance Overall balance assessment: Needs assistance Sitting-balance support: Feet supported, Bilateral upper extremity supported Sitting balance-Leahy Scale: Good     Standing balance support: Bilateral upper extremity supported, During functional activity, Reliant on assistive device for balance Standing balance-Leahy Scale: Fair Standing balance comment: reliant on BUE support for dynamic standing task     Communication Communication Communication: No apparent difficulties  Cognition Arousal: Alert Behavior During Therapy: WFL for tasks assessed/performed   PT - Cognitive impairments:  (Per spouse, seems to be at baseline cognition)   PT - Cognition Comments: Pt is alert, cooperative, and motivated to DC directly home versus rehab Following commands: Intact      Cueing Cueing Techniques: Verbal cues     General Comments General comments (  skin  integrity, edema, etc.): 105/74 (84) supine,then in  sitting 94/76 (83), Then in standing 94/74      Pertinent Vitals/Pain Pain Assessment Pain Assessment: No/denies pain Pain Intervention(s): Limited activity within patient's tolerance, Monitored during session, Premedicated before session, Repositioned     PT Goals (current goals can now be found in the care plan section) Acute Rehab PT Goals Patient Stated Goal: to get stronger Progress towards PT goals: Progressing toward goals    Frequency    Min 2X/week       Co-evaluation     PT goals addressed during session: Mobility/safety with mobility;Balance;Proper use of DME;Strengthening/ROM        AM-PAC PT 6 Clicks Mobility   Outcome Measure  Help needed turning from your back to your side while in a flat bed without using bedrails?: A Little Help needed moving from lying on your back to sitting on the side of a flat bed without using bedrails?: A Little Help needed moving to and from a bed to a chair (including a wheelchair)?: A Little Help needed standing up from a chair using your arms (e.g., wheelchair or bedside chair)?: A Little Help needed to walk in hospital room?: A Little Help needed climbing 3-5 steps with a railing? : A Lot 6 Click Score: 17    End of Session Equipment Utilized During Treatment: Oxygen (on his baseline 4 L throughout session) Activity Tolerance: Patient limited by fatigue Patient left: in bed;with call bell/phone within reach Nurse Communication: Mobility status PT Visit Diagnosis: Unsteadiness on feet (R26.81);Muscle weakness (generalized) (M62.81);Other abnormalities of gait and mobility (R26.89)     Time: 8952-8882 PT Time Calculation (min) (ACUTE ONLY): 30 min  Charges:    $Gait Training: 8-22 mins $Therapeutic Activity: 8-22 mins PT General Charges $$ ACUTE PT VISIT: 1 Visit                     Rankin Essex PTA 09/03/2024, 12:32 PM

## 2024-09-03 NOTE — Care Management Important Message (Signed)
 Important Message  Patient Details  Name: Kirk Robinson MRN: [id number] Date of Birth: May 29, 1956   Important Message Given:  Yes - Medicare IM     Cordelia Bessinger W, CMA 09/03/2024, 11:37 AM

## 2024-09-03 NOTE — Plan of Care (Signed)

## 2024-09-03 NOTE — Progress Notes (Signed)
 Occupational Therapy Treatment Patient Details Name: Kirk Robinson MRN: [id number] DOB: [birth year] Today's Date: 09/03/2024   History of present illness 69 y/o male presented to ED on 08/31/24 for SOB x 2 days. Admitted for acute on chronic respiratory failure with hypoxia 2/2 COPD exacerbation. 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 Pleasant and agreeable to OT session. He denies pain. Pt left on his baseline 4L 02 throughout session and noted with desat to 79% with functional mobility ~15-20 ft using RW with HR up to 136. Pt with quick improvement on 5L with PLB to 90s and weaned back to baseline 4L at 97%, RT in at end of session and notified. Edu to wife and pt on ECS, pacing, PLB and task modification for ADLs to maximize safety, indep and prevent overexertion upon return home. Pt will require a transport chair for long distances to car/MD appts, etc and recommend BSC for use at home when unable to walk the distance to the bathroom. Pt returned to bed with all needs in place and will cont to require skilled acute OT services to maximize his safety and IND to return to PLOF.       If plan is discharge home, recommend the following:  A lot of help with bathing/dressing/bathroom;A little help with walking and/or transfers;A little help with bathing/dressing/bathroom   Equipment Recommendations  BSC/3in1;Other (comment) (transport chair)    Recommendations for Other Services      Precautions / Restrictions Precautions Precautions: Fall Recall of Precautions/Restrictions: Intact Precaution/Restrictions Comments: watch O2 Restrictions Weight Bearing Restrictions Per Provider Order: No       Mobility Bed Mobility Overal bed mobility: Needs Assistance Bed Mobility: Supine to Sit, Sit to Supine     Supine to sit:  Supervision, HOB elevated, Used rails Sit to supine: Supervision   General bed mobility comments: increased time, but able to reach EOB and return to bed without physical assist; bed placed in trendelenburg and able to utilize bed rails and push through BLEs to scoot self up in bed    Transfers Overall transfer level: Needs assistance Equipment used: Rolling walker (2 wheels) Transfers: Sit to/from Stand Sit to Stand: Contact guard assist           General transfer comment: no lift off assist needed, fatigued easily with minimal activity and HR up to 136 sp02 79% on 4L     Balance Overall balance assessment: Needs assistance Sitting-balance support: Feet supported, Bilateral upper extremity supported Sitting balance-Leahy Scale: Good     Standing balance support: Bilateral upper extremity supported, During functional activity, Reliant on assistive device for balance Standing balance-Leahy Scale: Fair Standing balance comment: BUE support on RW                           ADL either performed or assessed with clinical judgement   ADL Overall ADL's : Needs assistance/impaired                                     Functional mobility during ADLs: Contact guard assist;Rolling walker (2 wheels) General ADL Comments: anticipate Min A for LB ADL management as pt is able to simulate figure four while seated EOB and edu on  bed level bridging to minimize exertion    Extremity/Trunk Assessment              Vision       Perception     Praxis     Communication Communication Communication: No apparent difficulties   Cognition Arousal: Alert Behavior During Therapy: WFL for tasks assessed/performed                                 Following commands: Intact        Cueing   Cueing Techniques: Verbal cues  Exercises Other Exercises Other Exercises: Edu on use of BSC for toileting tasks as needed for energy conservation and use of  PLB and modifications for ADLs/LB bathing and dressing to prevent overexertion. Pt and wife verbalized understanding.    Shoulder Instructions       General Comments HR up to 136 and sp02 desat to 79% on 4L with activity, increased to 5L with PLB and increased to 90s quickly with ability to wean back to 4L at 97%    Pertinent Vitals/ Pain       Pain Assessment Pain Assessment: No/denies pain Pain Intervention(s): Monitored during session  Home Living                                          Prior Functioning/Environment              Frequency  Min 2X/week        Progress Toward Goals  OT Goals(current goals can now be found in the care plan section)  Progress towards OT goals: Progressing toward goals  Acute Rehab OT Goals Patient Stated Goal: go home OT Goal Formulation: With patient Time For Goal Achievement: 09/15/24 Potential to Achieve Goals: Fair  Plan      Co-evaluation        PT goals addressed during session: Mobility/safety with mobility;Balance;Proper use of DME;Strengthening/ROM        AM-PAC OT 6 Clicks Daily Activity     Outcome Measure   Help from another person eating meals?: None Help from another person taking care of personal grooming?: A Little Help from another person toileting, which includes using toliet, bedpan, or urinal?: A Little Help from another person bathing (including washing, rinsing, drying)?: A Lot Help from another person to put on and taking off regular upper body clothing?: A Little Help from another person to put on and taking off regular lower body clothing?: A Lot 6 Click Score: 17    End of Session Equipment Utilized During Treatment: Oxygen;Rolling walker (2 wheels)  OT Visit Diagnosis: Unsteadiness on feet (R26.81);Repeated falls (R29.6);Muscle weakness (generalized) (M62.81);History of falling (Z91.81)   Activity Tolerance Patient limited by fatigue;Patient limited by pain   Patient  Left in bed;with call bell/phone within reach;with bed alarm set;with family/visitor present   Nurse Communication Mobility status        Time: 1250-1317 OT Time Calculation (min): 27 min  Charges: OT General Charges $OT Visit: 1 Visit OT Treatments $Self Care/Home Management : 8-22 mins $Therapeutic Activity: 8-22 mins  Kenleigh Toback Chrismon, OTR/L  09/03/2024, 2:19 PM   Shawnia Vizcarrondo E Chrismon 09/03/2024, 2:15 PM

## 2024-09-03 NOTE — Progress Notes (Signed)
 Per respiratory assessment protocol duonebs were changed to Q6, hoiwever, pt may benefit more from a couple long acting bronchodilators  pt is in no respiratory distress at this time

## 2024-09-03 NOTE — TOC Initial Note (Addendum)
 Transition of Care [facility]) - Initial/Assessment Note    Patient Details  Name: Kirk Robinson MRN: [id number] Date of Birth: 1955-10-12  [facility]) CM/SW Contact:    Kirk DELENA Daring, RN Phone Number: 09/03/2024, 1:18 PM  Clinical Narrative:                 RNCM met with patient in hospital room. Introduced self and explained role. Wife at bedside. Patient lives with wife, adult daughter and grandson in single family home. Wife, Kirk Robinson, drives to appointments. [facility]ses [facility] [place] for [facility] and denies difficulty affording medications. Home O2 with [employer]. Dme in home includes a regular walker, a borrowed rollator. They have access to a bedside commode and the home has a walk in shower.   Previoulsy had HH service. Agreeable to continue. Prefers to use same company if we can determine who they had service with (patient cannot remember). Requested we contact wife with [facility] offers when available.     Initiated search for [facility] service  Patient/ wife accepted service from [place]. Notified agency rep.  Notified [employer] of DME orders for transport wheelchair and bedside commode.  Also provided delivery address of [address] [place], [place], [place] [address]     Patient Goals and CMS Choice            Expected Discharge Plan and Services                                              Prior Living Arrangements/Services                       Activities of Daily Living   ADL Screening (condition at time of admission) Independently performs ADLs?: Yes (appropriate for developmental age) Is the patient deaf or have difficulty hearing?: No Does the patient have difficulty seeing, even when wearing glasses/contacts?: No Does the patient have difficulty concentrating, remembering, or making decisions?: No  Permission Sought/Granted                  Emotional Assessment              Admission diagnosis:  Acute renal insufficiency [N28.9] Hypoxia  [R09.02] General weakness [R53.1] COPD exacerbation (HCC) [J44.1] Acute on chronic respiratory failure with hypoxia (HCC) [J96.21] Chronically on benzodiazepine therapy [Z79.899] Patient Active Problem List   Diagnosis Date Noted   Acute on chronic respiratory failure with hypoxia (HCC) 08/31/2024   Chronic diastolic CHF (congestive heart failure) (HCC) 08/31/2024   Hypothyroidism 08/31/2024   Rheumatoid arthritis (HCC) 08/31/2024   Myocardial injury 08/31/2024   Shortness of breath    SVT (supraventricular tachycardia) 12/11/2023   Syncope 12/11/2023   Fall at home, initial encounter 12/11/2023   Type II diabetes mellitus with renal manifestations (HCC) 12/11/2023   Acute renal failure superimposed on stage 3a chronic kidney disease (HCC) 12/11/2023   Stroke (HCC) 12/11/2023   Hypokalemia 12/11/2023   Acute urinary retention 12/11/2023   Elevated lactic acid level 12/11/2023   Leukocytosis 12/11/2023   Rhinovirus infection 12/11/2023   Hypomagnesemia 12/11/2023   Colon cancer screening    Adenomatous polyp of colon    Chronic respiratory failure with hypoxia (HCC) 12/18/2020   Psychophysiological insomnia 12/13/2020   Atherosclerosis of native arteries of extremity with intermittent claudication 06/02/2020   Restless leg syndrome 12/26/2019  Centrilobular emphysema (HCC) 12/26/2019   Type 2 diabetes mellitus with hyperosmolar nonketotic hyperglycemia (HCC) 05/11/2019   Type 2 diabetes mellitus without complication, without long-term current use of insulin  (HCC) 03/19/2017   Anxiety and depression 03/19/2017   Amnesia 03/19/2017   Malnutrition of moderate degree 03/19/2015   Atrial fibrillation, chronic (HCC)    Hydrocephalus in adult [facility]) 05/18/2014   Obstructed VP shunt 05/11/2014   Chronic pain syndrome 05/11/2014   Pulmonary fibrosis (HCC) 01/14/2013   GERD (gastroesophageal reflux disease) 01/14/2013   Pure hypercholesterolemia 01/14/2013   Hyperlipidemia 05/30/2012    Depression 05/30/2012   COPD exacerbation (HCC) 05/30/2012   Degenerative disc disease 05/30/2012   Hypothyroidism following radioiodine therapy 05/30/2012   Osteoporosis 05/30/2012   Peripheral vascular disease 05/30/2012   Tobacco abuse 05/30/2012   Adult hypothyroidism 01/07/2010   History of colon polyps 09/30/2008   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   PCP:  Kirk Rayfield HERO, MD Pharmacy:   [phone] [address], [place] - [address] [place] [address] Phone: [phone] Fax: [phone]     Social Drivers of Health (SDOH) Social History: SDOH Screenings   Food Insecurity: No Food Insecurity (09/03/2024)  Housing: Low Risk (09/03/2024)  Transportation Needs: No Transportation Needs (09/03/2024)  Utilities: Not At Risk (09/03/2024)  Depression (PHQ2-9): Medium Risk (01/19/2022)  Financial Resource Strain: Low Risk  (03/05/2024)   Received from [facility]  Social Connections: Moderately Integrated (09/03/2024)  Tobacco Use: Medium Risk (08/31/2024)   SDOH Interventions:     Readmission Risk Interventions    09/03/2024    1:17 PM  Readmission Risk Prevention Plan  Transportation Screening Complete  PCP or Specialist Appt within 3-5 Days --  HRI or Home Care Consult Complete  Palliative Care Screening Not Applicable  Medication Review (RN Care Manager) Complete

## 2024-09-03 NOTE — Plan of Care (Signed)
 VSS. 4L [place]. Patient worked with therapy. See notes. Patient likely to discharge tomorrow.  Problem: Education: Goal: Ability to describe self-care measures that may prevent or decrease complications (Diabetes Survival Skills Education) will improve Outcome: Progressing Goal: Individualized Educational Video(s) Outcome: Progressing   Problem: Coping: Goal: Ability to adjust to condition or change in health will improve Outcome: Progressing   Problem: Fluid Volume: Goal: Ability to maintain a balanced intake and output will improve Outcome: Progressing   Problem: Health Behavior/Discharge Planning: Goal: Ability to identify and utilize available resources and services will improve Outcome: Progressing Goal: Ability to manage health-related needs will improve Outcome: Progressing   Problem: Metabolic: Goal: Ability to maintain appropriate glucose levels will improve Outcome: Progressing   Problem: Nutritional: Goal: Maintenance of adequate nutrition will improve Outcome: Progressing Goal: Progress toward achieving an optimal weight will improve Outcome: Progressing   Problem: Skin Integrity: Goal: Risk for impaired skin integrity will decrease Outcome: Progressing   Problem: Tissue Perfusion: Goal: Adequacy of tissue perfusion will improve Outcome: Progressing   Problem: Education: Goal: Knowledge of General Education information will improve Description: Including pain rating scale, medication(s)/side effects and non-pharmacologic comfort measures Outcome: Progressing   Problem: Health Behavior/Discharge Planning: Goal: Ability to manage health-related needs will improve Outcome: Progressing   Problem: Clinical Measurements: Goal: Ability to maintain clinical measurements within normal limits will improve Outcome: Progressing Goal: Will remain free from infection Outcome: Progressing Goal: Diagnostic test results will improve Outcome: Progressing Goal: Respiratory  complications will improve Outcome: Progressing Goal: Cardiovascular complication will be avoided Outcome: Progressing   Problem: Activity: Goal: Risk for activity intolerance will decrease Outcome: Progressing   Problem: Nutrition: Goal: Adequate nutrition will be maintained Outcome: Progressing   Problem: Coping: Goal: Level of anxiety will decrease Outcome: Progressing   Problem: Elimination: Goal: Will not experience complications related to bowel motility Outcome: Progressing Goal: Will not experience complications related to urinary retention Outcome: Progressing   Problem: Pain Managment: Goal: General experience of comfort will improve and/or be controlled Outcome: Progressing   Problem: Safety: Goal: Ability to remain free from injury will improve Outcome: Progressing   Problem: Skin Integrity: Goal: Risk for impaired skin integrity will decrease Outcome: Progressing   Problem: Education: Goal: Knowledge of disease or condition will improve Outcome: Progressing Goal: Knowledge of the prescribed therapeutic regimen will improve Outcome: Progressing Goal: Individualized Educational Video(s) Outcome: Progressing   Problem: Activity: Goal: Ability to tolerate increased activity will improve Outcome: Progressing Goal: Will verbalize the importance of balancing activity with adequate rest periods Outcome: Progressing   Problem: Respiratory: Goal: Ability to maintain a clear airway will improve Outcome: Progressing Goal: Levels of oxygenation will improve Outcome: Progressing Goal: Ability to maintain adequate ventilation will improve Outcome: Progressing

## 2024-09-03 NOTE — Progress Notes (Signed)
 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  He presented with shortness of breath which has been progressively worsening over the 2 days prior to arrival. On arrival patient was found to be in acute respiratory distress, difficulty speaking in full sentences, and increasing oxygen requirements.  Labs revealed worsening renal function.  Negative RVP, vitals consistent with tachycardia and hypoxemia requiring 6 L [place]. CXR showed chronic interstitial lung disease without infiltration.  He was admitted to progressive care unit for treatment of COPD exacerbation.  Patient has had gradual improvement on steroid therapy.  Subjective: No acute events overnight.  Patient worked well with physical therapy today.  He has no acute complaints this morning.  Objective: Vitals:   09/03/24 0754 09/03/24 0820 09/03/24 1211 09/03/24 1317  BP: 105/72  100/62   Pulse: 82  96   Resp: 16  17   Temp: 97.6 F (36.4 C)  97.8 F (36.6 C)   TempSrc:      SpO2: 97% 95% 98% 95%  Weight:      Height:        Intake/Output Summary (Last 24 hours) at 09/03/2024 1340 Last data filed at 09/03/2024 9078 Gross per 24 hour  Intake 240 ml  Output 1150 ml  Net -910 ml   Filed Weights   08/31/24 1938  Weight: 78 kg    Examination: General exam: Appears calm and comfortable, NAD  Respiratory system: No work of breathing, poor aeration bilaterally, currently on 4 L [place] Cardiovascular system: S1 & S2 heard, RRR.  Gastrointestinal system: Abdomen is  nondistended, soft and nontender.  Neuro: Alert and oriented.  Assessment & Plan:  Principal Problem:   Acute on chronic respiratory failure with hypoxia (HCC) Active Problems:   COPD exacerbation (HCC)   Pulmonary fibrosis (HCC)   Atrial fibrillation, chronic (HCC)   Myocardial injury   Acute renal failure superimposed on stage 3a chronic kidney disease (HCC)   Chronic diastolic CHF (congestive heart failure) (HCC)   Type II diabetes mellitus with renal manifestations (HCC)   Hyperlipidemia   Stroke (HCC)   Hypothyroidism   Rheumatoid arthritis (HCC)   Chronic pain syndrome   Anxiety and depression    Acute on chronic respiratory failure with hypoxia due to COPD exacerbation & pulmonary fibrosis - Increased O2 requirement, chronically on 4 L. - On arrival was requiring 6 L with respiratory distress - Has been on steroids, bronchodilators, Mucinex .  Continue with these.  Will discharge with an additional few days of prednisone  at DC - Continue home Ensifntrine, Jascayd -family has been made aware this is nonformulary and they will provide home meds - Encourage I-S - RVP is negative - No evidence of infection at this time requiring antibiotics - Worsening leukocytosis expected secondary to steroids - PT/OT.  Home health and DME ordered. - His wife reports he is always weaker in the evenings and would prefer him be discharged tomorrow morning  A-fib, chronic - Heart rate on arrival 103 has resolved.  Rate controlled now. - Not on anticoagulation at home - Continue  on telemetry.  Continue home dose metoprolol   Elevated troponin - Troponin 26->36 on repeat - No chest pain. Likely demand ischemia in setting of hypoxia - Continue home aspirin , Plavix , statin  Chronic diastolic CHF - Echocardiogram 7974: EF 50 to 55%, grade 1 diastolic dysfunction - Clinically euvolemic to dry - Monitor volume status closely - Hold Lasix given current AKI  Acute renal failure superimposed  on stage IIIa CKD - Baseline creatinine 1.35, peaked at 2.10 - Downtrending now. - Appears secondary to dehydration.  Has had decreased urinary output - Renal ultrasound negative for obstruction or retention - Did receive 500 cc bolus - Hold further diuretics for now - UA: Not consistent with infection  Hyperkalemia - Potassium 5.2, treated.  Repeat in a.m.  Hypothyroidism - Continue Synthroid   Type 2 diabetes with renal manifestations - Hemoglobin A1c 6.1%, well-controlled - Takes metformin  at home - Likely to develop hyperglycemia due to steroids while admitted.  Continue with sliding scale insulin  for now  History of CVA - Continue aspirin , Plavix , statin  Hyperlipidemia - Continue statin  Chronic pain - At home is on MS Contin  30 mg twice daily, continue this - PDMP reviewed  Anxiety Depression - Continue home meds: Effexor , Requip , Valium , Abilify   BPH Acute urinary retention - Foley catheter inserted on 1/11, will trial of void the patient is more ambulatory. - Continue Flomax   Rheumatoid arthritis - Daily prednisone  10 mg and monthly Cimzia injections - Follow outpatient with rheumatology - Currently on higher dose steroids as above.  Will gradually taper to home dose at time of discharge -PT/OT.  Home health services as well as bedside commode and lightweight wheelchair ordered.  Wife requesting for discharge home tomorrow.  Body mass index is 25.4 kg/m. - Outpatient follow up for lifestyle modification and risk factor management   DVT prophylaxis: Lovenox    Code Status: Full Code Disposition: Ready for discharge.  Home health has been ordered with plans to DC in a.m. at the patient's wife's request  Consultants:    Procedures:    Antimicrobials:  Anti-infectives (From admission, onward)    None       Data Reviewed: I have personally reviewed following labs and imaging studies CBC: Recent Labs  Lab 08/31/24 1940 09/01/24 0610  09/02/24 0930  WBC 10.3 8.1 10.7*  NEUTROABS  --   --  8.4*  HGB 12.5* 11.0* 10.9*  HCT 38.1* 35.2* 32.4*  MCV 92.9 97.2 91.3  PLT 285 208 220   Basic Metabolic Panel: Recent Labs  Lab 08/31/24 1940 09/01/24 0610 09/02/24 0930  NA 137 138 138  K 4.7 5.2* 3.9  CL 98 96* 97*  CO2 20* 18* 30  GLUCOSE 175* 173* 181*  BUN 33* 35* 37*  CREATININE 1.88* 2.10* 1.63*  CALCIUM  9.4 9.0 9.3   GFR: Estimated Creatinine Clearance: 44.6 mL/min (A) (by C-G formula based on SCr of 1.63 mg/dL (H)). Liver Function Tests: Recent Labs  Lab 09/02/24 0930  AST 14*  ALT 8  ALKPHOS 51  BILITOT 0.4  PROT 6.7  ALBUMIN 4.0   CBG: Recent Labs  Lab 09/02/24 1103 09/02/24 1627 09/02/24 2145 09/03/24 0755 09/03/24 1212  GLUCAP 154* 259* 131* 130* 269*    Recent Results (from the past 240 hours)  Resp panel by RT-PCR (RSV, Flu A&B, Covid) Anterior Nasal Swab     Status: None   Collection Time: 08/31/24 10:40 PM   Specimen: Anterior Nasal Swab  Result Value Ref Range Status  SARS Coronavirus 2 by RT PCR NEGATIVE NEGATIVE Final    Comment: (NOTE) SARS-CoV-2 target nucleic acids are NOT DETECTED.  The SARS-CoV-2 RNA is generally detectable in upper respiratory specimens during the acute phase of infection. The lowest concentration of SARS-CoV-2 viral copies this assay can detect is 138 copies/mL. A negative result does not preclude SARS-Cov-2 infection and should not be used as the sole basis for treatment or other patient management decisions. A negative result may occur with  improper specimen collection/handling, submission of specimen other than nasopharyngeal swab, presence of viral mutation(s) within the areas targeted by this assay, and inadequate number of viral copies(<138 copies/mL). A negative result must be combined with clinical observations, patient history, and epidemiological information. The expected result is Negative.  Fact Sheet for Patients:   bloggercourse.com  Fact Sheet for Healthcare Providers:  seriousbroker.it  This test is no t yet approved or cleared by the [place]  FDA and  has been authorized for detection and/or diagnosis of SARS-CoV-2 by FDA under an Emergency Use Authorization (EUA). This EUA will remain  in effect (meaning this test can be used) for the duration of the COVID-19 declaration under Section 564(b)(1) of the Act, 21 U.S.C.section 360bbb-3(b)(1), unless the authorization is terminated  or revoked sooner.       Influenza A by PCR NEGATIVE NEGATIVE Final   Influenza B by PCR NEGATIVE NEGATIVE Final    Comment: (NOTE) The Xpert Xpress SARS-CoV-2/FLU/RSV plus assay is intended as an aid in the diagnosis of influenza from Nasopharyngeal swab specimens and should not be used as a sole basis for treatment. Nasal washings and aspirates are unacceptable for Xpert Xpress SARS-CoV-2/FLU/RSV testing.  Fact Sheet for Patients: bloggercourse.com  Fact Sheet for Healthcare Providers: seriousbroker.it  This test is not yet approved or cleared by the [place]  FDA and has been authorized for detection and/or diagnosis of SARS-CoV-2 by FDA under an Emergency Use Authorization (EUA). This EUA will remain in effect (meaning this test can be used) for the duration of the COVID-19 declaration under Section 564(b)(1) of the Act, 21 U.S.C. section 360bbb-3(b)(1), unless the authorization is terminated or revoked.     Resp Syncytial Virus by PCR NEGATIVE NEGATIVE Final    Comment: (NOTE) Fact Sheet for Patients: bloggercourse.com  Fact Sheet for Healthcare Providers: seriousbroker.it  This test is not yet approved or cleared by the [place]  FDA and has been authorized for detection and/or diagnosis of SARS-CoV-2 by FDA under an Emergency Use  Authorization (EUA). This EUA will remain in effect (meaning this test can be used) for the duration of the COVID-19 declaration under Section 564(b)(1) of the Act, 21 U.S.C. section 360bbb-3(b)(1), unless the authorization is terminated or revoked.  Performed at [facility], [address] [place], [place] [address]   Respiratory (~20 pathogens) panel by PCR     Status: None   Collection Time: 08/31/24 10:40 PM   Specimen: Nasopharyngeal Swab; Respiratory  Result Value Ref Range Status   Adenovirus NOT DETECTED NOT DETECTED Final   Coronavirus 229E NOT DETECTED NOT DETECTED Final    Comment: (NOTE) The Coronavirus on the Respiratory Panel, DOES NOT test for the novel  Coronavirus (2019 nCoV)    Coronavirus HKU1 NOT DETECTED NOT DETECTED Final   Coronavirus NL63 NOT DETECTED NOT DETECTED Final   Coronavirus OC43 NOT DETECTED NOT DETECTED Final   Metapneumovirus NOT DETECTED NOT DETECTED Final   Rhinovirus / Enterovirus NOT DETECTED NOT DETECTED Final   Influenza A  NOT DETECTED NOT DETECTED Final   Influenza B NOT DETECTED NOT DETECTED Final   Parainfluenza Virus 1 NOT DETECTED NOT DETECTED Final   Parainfluenza Virus 2 NOT DETECTED NOT DETECTED Final   Parainfluenza Virus 3 NOT DETECTED NOT DETECTED Final   Parainfluenza Virus 4 NOT DETECTED NOT DETECTED Final   Respiratory Syncytial Virus NOT DETECTED NOT DETECTED Final   Bordetella pertussis NOT DETECTED NOT DETECTED Final   Bordetella Parapertussis NOT DETECTED NOT DETECTED Final   Chlamydophila pneumoniae NOT DETECTED NOT DETECTED Final   Mycoplasma pneumoniae NOT DETECTED NOT DETECTED Final    Comment: Performed at [facility], [address] [place], [place] [address]     Radiology Studies: No results found.   Scheduled Meds:  ARIPiprazole   5 mg Oral Daily   aspirin  EC  81 mg Oral Daily   atorvastatin   20 mg Oral q1800   Chlorhexidine  Gluconate Cloth  6 each Topical Daily   clopidogrel   75 mg  Oral Daily   diazepam   5 mg Oral Q12H   enoxaparin  (LOVENOX ) injection  40 mg Subcutaneous Q24H   Ensifentrine   1 Inhalation Inhalation Q1400   insulin  aspart  0-5 Units Subcutaneous QHS   insulin  aspart  0-9 Units Subcutaneous TID WC   ipratropium-albuterol   3 mL Inhalation Q6H   levothyroxine   100 mcg Oral Q0600   methylPREDNISolone  (SOLU-MEDROL ) injection  80 mg Intravenous Daily   metoprolol  succinate  12.5 mg Oral Daily   mirtazapine   45 mg Oral QHS   morphine   30 mg Oral Q12H   nerandomilast   18 mg Oral BID   pantoprazole   40 mg Oral BID AC   rOPINIRole   0.5 mg Oral QHS   tamsulosin   0.8 mg Oral QPC supper   venlafaxine  XR  150 mg Oral Q breakfast   Continuous Infusions:   LOS: 3 days  MDM: Patient is high risk for one or more organ failure.  They necessitate ongoing hospitalization for continued IV therapies and subsequent lab monitoring. Total time spent interpreting labs and vitals, reviewing the medical record, coordinating care amongst consultants and care team members, directly assessing and discussing care with the patient and/or family: 55 min  Ylonda Storr, DO [facility]  To contact the attending physician between 7A-7P please use Epic Chat. To contact the covering physician during after hours 7P-7A, please review Amion.  09/03/2024, 1:40 PM   *This document has been created with the assistance of dictation software. Please excuse typographical errors. *   "

## 2024-09-03 NOTE — TOC CM/SW Note (Signed)
 Transport Wheelchair: The patient's mobility limitation cannot be resolved by the use of a cane of walker. The patient does not have enough upper extremity strength and mental capacities to safely propel the wheelchair on a given day. Also, the patient has a caregiver willing to provide assistance if needed.  Commode: The patient is physically incapable of utilizing regular toilet facilities and they are confined to a single room with no toilet.

## 2024-09-04 ENCOUNTER — Other Ambulatory Visit: Payer: Self-pay

## 2024-09-04 LAB — EXPECTORATED SPUTUM ASSESSMENT W GRAM STAIN, RFLX TO RESP C

## 2024-09-04 LAB — GLUCOSE, CAPILLARY
Glucose-Capillary: 127 mg/dL — ABNORMAL HIGH (ref 70–99)
Glucose-Capillary: 247 mg/dL — ABNORMAL HIGH (ref 70–99)
Glucose-Capillary: 210 mg/dL — ABNORMAL HIGH (ref 70–99)

## 2024-09-04 MED ORDER — PREDNISONE 10 MG PO TABS
ORAL_TABLET | ORAL | 0 refills | Status: DC
  Filled 2024-09-04: qty 27, 9d supply, fill #0

## 2024-09-04 MED ORDER — ALBUTEROL SULFATE (2.5 MG/3ML) 0.083% IN NEBU: 2.5000 mg | INHALATION_SOLUTION | Freq: Four times a day (QID) | RESPIRATORY_TRACT | Status: AC | PRN

## 2024-09-04 NOTE — Progress Notes (Signed)
 DISCHARGE NOTE HOME Kirk Robinson to be discharged Home per MD order. Discussed prescriptions and follow up appointments with the patient. Prescriptions given to patient; medication list explained in detail. Patient verbalized understanding.   Skin clean, dry and intact without evidence of skin break down, no evidence of skin tears noted. IV catheter discontinued intact. Site without signs and symptoms of complications. Dressing and pressure applied. Pt denies pain at the site currently. No complaints noted.   Patient free of lines, drains, and wounds.   An After Visit Summary (AVS) was printed and given to the patient.    [name] RN

## 2024-09-04 NOTE — TOC Transition Note (Signed)
 Transition of Care [facility]) - Discharge Note   Patient Details  Name: Kirk Robinson MRN: [id number] Date of Birth: May 28, 1956  Transition of [facility]) CM/SW Contact:  Lauraine JAYSON Carpen, LCSW Phone Number: 09/04/2024, 11:13 AM   Clinical Narrative:   Patient has orders to discharge home today. [facility] liaison is aware. No further concerns. CSW signing off.  Final next level of care: Home w Home Health Services Barriers to Discharge: Barriers Resolved   Patient Goals and CMS Choice            Discharge Placement                    Patient and family notified of of transfer: 09/04/24  Discharge Plan and Services Additional resources added to the After Visit Summary for     Discharge Planning Services: CM Consult            DME Arranged: 3-N-1, High strength lightweight manual wheelchair with seat cushion DME Agency: [employer] Date DME Agency Contacted: 09/04/24   Representative spoke with at DME Agency: [place] HH Arranged: RN, PT, OT, Nurse's Aide HH Agency: [facility] Date [facility] Agency Contacted: 09/04/24   Representative spoke with at [facility] Agency: [place]  Social Drivers of Health (SDOH) Interventions SDOH Screenings   Food Insecurity: No Food Insecurity (09/03/2024)  Housing: Low Risk (09/03/2024)  Transportation Needs: No Transportation Needs (09/03/2024)  Utilities: Not At Risk (09/03/2024)  Depression (PHQ2-9): Medium Risk (01/19/2022)  Financial Resource Strain: Low Risk  (03/05/2024)   Received from [facility]  Social Connections: Moderately Integrated (09/03/2024)  Tobacco Use: Medium Risk (08/31/2024)     Readmission Risk Interventions    09/03/2024    1:17 PM  Readmission Risk Prevention Plan  Transportation Screening Complete  PCP or Specialist Appt within 3-5 Days --  HRI or Home Care Consult Complete  Palliative Care Screening Not Applicable  Medication Review (RN Care Manager) Complete

## 2024-09-04 NOTE — Discharge Summary (Signed)
 "  Physician Discharge Summary   Kirk Robinson  male DOB: [birth year]  FMW:[id number]  PCP: Claudene Rayfield HERO, MD  Admit date: 08/31/2024 Discharge date: 09/04/2024  Admitted From: home Disposition:  home.  Pt and family declined SNF rehab. Home Health: Yes CODE STATUS: Full code   Hospital Course:  For full details, please see H&P, progress notes, consult notes and ancillary notes.  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  He presented with shortness of breath which has been progressively worsening over the 2 days prior to arrival.  On arrival patient was found to be in acute respiratory distress, difficulty speaking in full sentences, and increasing oxygen requirements to 6 L .  CXR showed chronic interstitial lung disease without infiltration.  He was admitted for treatment of COPD exacerbation.  Patient had gradual improvement on steroid therapy.  Acute on chronic respiratory failure with hypoxia due to COPD exacerbation & pulmonary fibrosis chronically on 4 L. - On arrival was requiring 6 L with respiratory distress.  RVP negative.  No evidence of infection at this time requiring antibiotics. --received IV solumedrol 125 mg on presentation f/b 80 mg daily.  Also received scheduled DuoNeb. --respiratory status improved, pt was back on 4L O2, and was discharged on prednisone  taper back to home prednisone  10 mg daily. --Not taking Pirfenidone PTA, takes nerandomilast  instead. --cont home bronchodilators as listed below   A-fib, chronic - Not on anticoagulation at home - Continue home dose metoprolol    Elevated troponin - Troponin 26->36 on repeat - No chest pain. Likely demand ischemia in setting of hypoxia  Chronic diastolic  CHF - Echocardiogram 2025: EF 50 to 55%, grade 1 diastolic dysfunction - Clinically euvolemic to dry --pt not taking torsemide PTA   Acute renal failure superimposed on stage IIIa CKD - Baseline creatinine 1.35, peaked at 2.10 - Appears secondary to dehydration.  Has had decreased urinary output - Renal ultrasound negative for obstruction or retention - Did receive 500 cc bolus.  Cr improved to 1.63 prior to discharge.  Acute metabolic acidosis, present on admission  --likely due to AKI.  Resolved prior to discharge.   Hyperkalemia - Potassium 5.2, treated with lokelma   Hypothyroidism - Continue Synthroid    Type 2 diabetes with renal manifestations - Hemoglobin A1c 6.1%, well-controlled - resume metformin  after discharge.  History of CVA - Continue aspirin , Plavix , statin   Hyperlipidemia - Continue statin   Chronic pain - cont home MS Contin  30 mg twice daily   Anxiety Depression - Continue home meds: Effexor , remeron , Valium , Abilify    BPH Acute urinary retention - Foley catheter inserted on 1/11, removed prior to discharge. - Continue Flomax    Rheumatoid arthritis - Daily prednisone  10 mg and monthly Cimzia injections - Follow outpatient with rheumatology --discharged on prednisone  taper back to home dose of 10 mg daily   Unless noted above, medications under STOP list are ones pt was not taking PTA.  Discharge Diagnoses:  Principal Problem:   Acute on chronic respiratory failure with hypoxia (HCC) Active Problems:   COPD exacerbation (HCC)   Pulmonary fibrosis (HCC)   Atrial fibrillation, chronic (HCC)   Myocardial injury   Acute renal failure superimposed on stage 3a chronic kidney disease (HCC)   Chronic diastolic CHF (congestive heart failure) (  HCC)   Type II diabetes mellitus with renal manifestations (HCC)   Hyperlipidemia   Stroke (HCC)   Hypothyroidism   Rheumatoid arthritis (HCC)   Chronic pain syndrome   Anxiety and depression   30  Day Unplanned Readmission Risk Score    Flowsheet Row ED to Hosp-Admission (Current) from 08/31/2024 in [facility]  30 Day Unplanned Readmission Risk Score (%) 29.02 Filed at 09/04/2024 0801    This score is the patient's risk of an unplanned readmission within 30 days of being discharged (0 -100%). The score is based on dignosis, age, lab data, medications, orders, and past utilization.   Low:  0-14.9   Medium: 15-21.9   High: 22-29.9   Extreme: 30 and above         Discharge Instructions:  Allergies as of 09/04/2024       Reactions   Advair Diskus [fluticasone-salmeterol] Shortness Of Breath   Erythromycin Nausea And Vomiting   Iodinated Contrast Media Shortness Of Breath, Other (See Comments)   Ativan  [lorazepam ] Other (See Comments)   Caused pt's heart to stop   Cephalexin Other (See Comments)   Heart Burn   Darvocet [propoxyphene N-acetaminophen ] Nausea And Vomiting   Doxycycline Hyclate Nausea And Vomiting, Swelling, Other (See Comments)   Tongue swelling, severe depression   Hydroxychloroquine Other (See Comments)   Restless leg   Propoxyphene Nausea And Vomiting   Septra [sulfamethoxazole-trimethoprim] Rash        Medication List     STOP taking these medications    midodrine 2.5 MG tablet Commonly known as: PROAMATINE   Pirfenidone 267 MG Tabs   torsemide 20 MG tablet Commonly known as: DEMADEX       TAKE these medications    rOPINIRole  0.5 MG tablet Commonly known as: REQUIP  Take 0.5 mg by mouth at bedtime. The timing of this medication is very important.   Accu-Chek FastClix Lancets Misc Apply topically daily.   Accu-Chek Guide w/Device Kit See admin instructions.   albuterol  108 (90 Base) MCG/ACT inhaler Commonly known as: VENTOLIN  HFA Inhale 2 puffs into the lungs every 6 (six) hours as needed for wheezing or shortness of breath. What changed: Another medication with the same name was changed. Make sure you  understand how and when to take each.   albuterol  (2.5 MG/3ML) 0.083% nebulizer solution Commonly known as: PROVENTIL  Inhale 3 mLs (2.5 mg total) into the lungs every 6 (six) hours as needed for wheezing. Home med. What changed: additional instructions   ARIPiprazole  5 MG tablet Commonly known as: ABILIFY  Take 5 mg by mouth daily.   Aspirin  Low Dose 81 MG tablet Generic drug: aspirin  EC TAKE 1 TABLET BY MOUTH EVERY DAY   atorvastatin  20 MG tablet Commonly known as: LIPITOR Take 1 tablet (20 mg total) by mouth daily at 6 PM.   Breztri  Aerosphere 160-9-4.8 MCG/ACT Aero inhaler Generic drug: budesonide -glycopyrrolate -formoterol  SMARTSIG:2 Inhalation Via Inhaler Twice Daily   clopidogrel  75 MG tablet Commonly known as: PLAVIX  Take 1 tablet (75 mg total) by mouth daily.   diazepam  5 MG tablet Commonly known as: VALIUM  Take 5 mg by mouth every 12 (twelve) hours.   Docusate Sodium  100 MG capsule Take 100 mg by mouth in the morning, at noon, and at bedtime.   Ensure Max Protein Liqd Take 330 mLs (11 oz total) by mouth 2 (two) times daily.   glucose blood test strip See admin instructions.   ipratropium 0.03 % nasal spray Commonly known as: ATROVENT   Place 1 spray into the nose 2 (two) times daily as needed for rhinitis.   ipratropium-albuterol  0.5-2.5 (3) MG/3ML Soln Commonly known as: DUONEB Inhale 3 mLs into the lungs 4 (four) times daily.   Jascayd  18 MG tablet Generic drug: nerandomilast  Take 18 mg by mouth in the morning and at bedtime.   levothyroxine  100 MCG tablet Commonly known as: SYNTHROID  Take 100 mcg by mouth every morning.   metFORMIN  1000 MG tablet Commonly known as: GLUCOPHAGE  TAKE 1 TABLET BY MOUTH 2 TIMES DAILY WITH MEAL. What changed: See the new instructions.   metoprolol  succinate 25 MG 24 hr tablet Commonly known as: TOPROL -XL Take 12.5 mg by mouth daily.   mirtazapine  45 MG tablet Commonly known as: REMERON  Take 45 mg by mouth at  bedtime.   morphine  30 MG 12 hr tablet Commonly known as: MS CONTIN  Take 30 mg by mouth every 12 (twelve) hours.   naloxone  4 MG/0.1ML Liqd nasal spray kit Commonly known as: NARCAN  Place into the nose.   Ohtuvayre  3 MG/2.5ML Susp Generic drug: Ensifentrine  Inhale 1 Inhalation into the lungs in the morning and at bedtime.   pantoprazole  40 MG tablet Commonly known as: PROTONIX  Take 40 mg by mouth 2 (two) times daily before a meal.   predniSONE  10 MG tablet Commonly known as: DELTASONE  Take 40 mg (4 tabs) from 1/15 to 1/17, then 30 mg (3 tabs) from 1/18 to 1/20, then 20 mg (2 tabs) from 1/21 to 1/23, then back on your home 10 mg daily. What changed:  how much to take how to take this when to take this additional instructions   tamsulosin  0.4 MG Caps capsule Commonly known as: FLOMAX  Take 2 capsules (0.8 mg total) by mouth daily after supper.   venlafaxine  XR 150 MG 24 hr capsule Commonly known as: EFFEXOR -XR Take 1 capsule (150 mg total) by mouth daily with breakfast.   Vitamin D  (Ergocalciferol ) 1.25 MG (50000 UNIT) Caps capsule Commonly known as: DRISDOL  Take 50,000 Units by mouth every 7 (seven) days.               Durable Medical Equipment  (From admission, onward)           Start     Ordered   09/03/24 1340  For home use only DME Bedside commode  Once       Question:  Patient needs a bedside commode to treat with the following condition  Answer:  Gait disturbance   09/03/24 1339   09/03/24 1340  For home use only DME high strength lightweight manual wheelchair with seat cushion  Once       Comments: Patient suffers from rheumatoid arthritis and pulmonary fibrosis which impairs their ability to perform daily activities like bathing, dressing, feeding, grooming, and toileting in the home.  A walker will not resolve  issue with performing activities of daily living. A wheelchair will allow patient to safely perform daily activities.Length of need Lifetime.  (THEN ONE OF THESE TWO:) Patient self-propels the wheelchair while engaging in frequent activities such as laundry, meals, and toileting which cannot be performed in a standard or lightweight wheelchair due to the weight of the chair. Accessories: elevating leg rests (ELRs), wheel locks, extensions and anti-tippers.   09/03/24 1340             Contact information for follow-up providers     Theotis Lavelle BRAVO, MD Follow up in 1 week(s).   Specialty: Specialist Contact information: [address]   [place] [address] [phone]              Contact information for after-discharge care     Home Medical Care     [facility]) .   Service: Home Health Services Contact information: [address] [place]  [address] [phone]                     Allergies[1]   The results of significant diagnostics from this hospitalization (including imaging, microbiology, ancillary and laboratory) are listed below for reference.   Consultations:   Procedures/Studies: US  Renal Result Date: 08/31/2024 EXAM: RETROPERITONEAL ULTRASOUND OF THE KIDNEYS 08/31/2024 10:34:32 PM TECHNIQUE: Real-time ultrasonography of the retroperitoneum, specifically the kidneys and urinary bladder, was performed. COMPARISON: None available. CLINICAL HISTORY: Evaluate for hydronephrosis, AKI, unable to urinate for a day. FINDINGS: RIGHT KIDNEY: Right kidney measures 9.4 x 4.3 x 3.8 cm with a volume of 77 cc. Renal cortical thickness and echogenicity are within normal limits. No hydronephrosis. No intrarenal mass or calcification. LEFT KIDNEY: Left kidney measures 9.4 x 4.9 x 4.9 cm with a volume of 118 cc. Renal cortical thickness and echogenicity are within normal limits. No hydronephrosis. No intrarenal mass or calcification. BLADDER: Unremarkable appearance of the bladder. Bilateral ureteral jets were identified. IMPRESSION: 1. No  hydronephrosis or intrarenal mass. Electronically signed by: Dorethia Molt MD MD 08/31/2024 11:05 PM EST RP Workstation: [id number]   DG Chest 2 View Result Date: 08/31/2024 EXAM: 2 VIEW(S) XRAY OF THE CHEST 08/31/2024 08:11:50 PM COMPARISON: 04/27/2024 CLINICAL HISTORY: shob FINDINGS: LINES, TUBES AND DEVICES: Partially visualized VP shunt coursing along left chest. LUNGS AND PLEURA: Redemonstration of coarse interstitial markings throughout bilateral lungs, unchanged from prior study, in keeping with changes of underlying interstitial lung disease that are seen on CT examination of 12/11/2023. No superimposed confluent pulmonary infiltrate. No pleural effusion. No pneumothorax. HEART AND MEDIASTINUM: No acute abnormality of the cardiac and mediastinal silhouettes. BONES AND SOFT TISSUES: No acute osseous abnormality. IMPRESSION: 1. No acute cardiopulmonary findings 2. Chronic coarse interstitial markings throughout the bilateral lungs, consistent with underlying interstitial lung disease. Electronically signed by: Dorethia Molt MD MD 08/31/2024 08:32 PM EST RP Workstation: [id number]      Labs: BNP (last 3 results) No results for input(s): BNP in the last 8760 hours. Basic Metabolic Panel: Recent Labs  Lab 08/31/24 1940 09/01/24 0610 09/02/24 0930  NA 137 138 138  K 4.7 5.2* 3.9  CL 98 96* 97*  CO2 20* 18* 30  GLUCOSE 175* 173* 181*  BUN 33* 35* 37*  CREATININE 1.88* 2.10* 1.63*  CALCIUM  9.4 9.0 9.3   Liver Function Tests: Recent Labs  Lab 09/02/24 0930  AST 14*  ALT 8  ALKPHOS 51  BILITOT 0.4  PROT 6.7  ALBUMIN 4.0   No results for input(s): LIPASE, AMYLASE in the last 168 hours. No results for input(s): AMMONIA in the last 168 hours. CBC: Recent Labs  Lab 08/31/24 1940 09/01/24 0610 09/02/24 0930  WBC 10.3 8.1 10.7*  NEUTROABS  --   --  8.4*  HGB 12.5* 11.0* 10.9*  HCT 38.1* 35.2* 32.4*  MCV 92.9 97.2 91.3  PLT 285 208 220   Cardiac Enzymes: No results  for input(s): CKTOTAL, CKMB, CKMBINDEX, TROPONINI in the last 168 hours. BNP: Invalid input(s): POCBNP CBG: Recent Labs  Lab 09/03/24 0755 09/03/24 1212 09/03/24 1652 09/03/24 2148 09/04/24 0814  GLUCAP 130* 269* 267* 135* 127*  D-Dimer No results for input(s): DDIMER in the last 72 hours. Hgb A1c No results for input(s): HGBA1C in the last 72 hours. Lipid Profile No results for input(s): CHOL, HDL, LDLCALC, TRIG, CHOLHDL, LDLDIRECT in the last 72 hours. Thyroid  function studies No results for input(s): TSH, T4TOTAL, T3FREE, THYROIDAB in the last 72 hours.  Invalid input(s): FREET3 Anemia work up No results for input(s): VITAMINB12, FOLATE, FERRITIN, TIBC, IRON, RETICCTPCT in the last 72 hours. Urinalysis    Component Value Date/Time   COLORURINE YELLOW (A) 09/01/2024 0029   APPEARANCEUR HAZY (A) 09/01/2024 0029   APPEARANCEUR Clear 03/02/2021 1456   LABSPEC 1.025 09/01/2024 0029   LABSPEC 1.006 11/20/2011 2054   PHURINE 5.0 09/01/2024 0029   GLUCOSEU 150 (A) 09/01/2024 0029   GLUCOSEU Negative 11/20/2011 2054   HGBUR SMALL (A) 09/01/2024 0029   BILIRUBINUR NEGATIVE 09/01/2024 0029   BILIRUBINUR Negative 03/02/2021 1456   BILIRUBINUR Negative 11/20/2011 2054   KETONESUR NEGATIVE 09/01/2024 0029   PROTEINUR 100 (A) 09/01/2024 0029   UROBILINOGEN 1.0 03/01/2017 1249   UROBILINOGEN 1.0 03/13/2015 2215   NITRITE NEGATIVE 09/01/2024 0029   LEUKOCYTESUR NEGATIVE 09/01/2024 0029   LEUKOCYTESUR Negative 11/20/2011 2054   Sepsis Labs Recent Labs  Lab 08/31/24 1940 09/01/24 0610 09/02/24 0930  WBC 10.3 8.1 10.7*   Microbiology Recent Results (from the past 240 hours)  Resp panel by RT-PCR (RSV, Flu A&B, Covid) Anterior Nasal Swab     Status: None   Collection Time: 08/31/24 10:40 PM   Specimen: Anterior Nasal Swab  Result Value Ref Range Status   SARS Coronavirus 2 by RT PCR NEGATIVE NEGATIVE Final    Comment:  (NOTE) SARS-CoV-2 target nucleic acids are NOT DETECTED.  The SARS-CoV-2 RNA is generally detectable in upper respiratory specimens during the acute phase of infection. The lowest concentration of SARS-CoV-2 viral copies this assay can detect is 138 copies/mL. A negative result does not preclude SARS-Cov-2 infection and should not be used as the sole basis for treatment or other patient management decisions. A negative result may occur with  improper specimen collection/handling, submission of specimen other than nasopharyngeal swab, presence of viral mutation(s) within the areas targeted by this assay, and inadequate number of viral copies(<138 copies/mL). A negative result must be combined with clinical observations, patient history, and epidemiological information. The expected result is Negative.  Fact Sheet for Patients:  bloggercourse.com  Fact Sheet for Healthcare Providers:  seriousbroker.it  This test is no t yet approved or cleared by the [place]  FDA and  has been authorized for detection and/or diagnosis of SARS-CoV-2 by FDA under an Emergency Use Authorization (EUA). This EUA will remain  in effect (meaning this test can be used) for the duration of the COVID-19 declaration under Section 564(b)(1) of the Act, 21 U.S.C.section 360bbb-3(b)(1), unless the authorization is terminated  or revoked sooner.       Influenza A by PCR NEGATIVE NEGATIVE Final   Influenza B by PCR NEGATIVE NEGATIVE Final    Comment: (NOTE) The Xpert Xpress SARS-CoV-2/FLU/RSV plus assay is intended as an aid in the diagnosis of influenza from Nasopharyngeal swab specimens and should not be used as a sole basis for treatment. Nasal washings and aspirates are unacceptable for Xpert Xpress SARS-CoV-2/FLU/RSV testing.  Fact Sheet for Patients: bloggercourse.com  Fact Sheet for Healthcare  Providers: seriousbroker.it  This test is not yet approved or cleared by the [place]  FDA and has been authorized for detection and/or diagnosis of SARS-CoV-2 by FDA under  an Emergency Use Authorization (EUA). This EUA will remain in effect (meaning this test can be used) for the duration of the COVID-19 declaration under Section 564(b)(1) of the Act, 21 U.S.C. section 360bbb-3(b)(1), unless the authorization is terminated or revoked.     Resp Syncytial Virus by PCR NEGATIVE NEGATIVE Final    Comment: (NOTE) Fact Sheet for Patients: bloggercourse.com  Fact Sheet for Healthcare Providers: seriousbroker.it  This test is not yet approved or cleared by the [place]  FDA and has been authorized for detection and/or diagnosis of SARS-CoV-2 by FDA under an Emergency Use Authorization (EUA). This EUA will remain in effect (meaning this test can be used) for the duration of the COVID-19 declaration under Section 564(b)(1) of the Act, 21 U.S.C. section 360bbb-3(b)(1), unless the authorization is terminated or revoked.  Performed at [facility], [address] [place], [place] [address]   Respiratory (~20 pathogens) panel by PCR     Status: None   Collection Time: 08/31/24 10:40 PM   Specimen: Nasopharyngeal Swab; Respiratory  Result Value Ref Range Status   Adenovirus NOT DETECTED NOT DETECTED Final   Coronavirus 229E NOT DETECTED NOT DETECTED Final    Comment: (NOTE) The Coronavirus on the Respiratory Panel, DOES NOT test for the novel  Coronavirus (2019 nCoV)    Coronavirus HKU1 NOT DETECTED NOT DETECTED Final   Coronavirus NL63 NOT DETECTED NOT DETECTED Final   Coronavirus OC43 NOT DETECTED NOT DETECTED Final   Metapneumovirus NOT DETECTED NOT DETECTED Final   Rhinovirus / Enterovirus NOT DETECTED NOT DETECTED Final   Influenza A NOT DETECTED NOT DETECTED Final   Influenza B NOT  DETECTED NOT DETECTED Final   Parainfluenza Virus 1 NOT DETECTED NOT DETECTED Final   Parainfluenza Virus 2 NOT DETECTED NOT DETECTED Final   Parainfluenza Virus 3 NOT DETECTED NOT DETECTED Final   Parainfluenza Virus 4 NOT DETECTED NOT DETECTED Final   Respiratory Syncytial Virus NOT DETECTED NOT DETECTED Final   Bordetella pertussis NOT DETECTED NOT DETECTED Final   Bordetella Parapertussis NOT DETECTED NOT DETECTED Final   Chlamydophila pneumoniae NOT DETECTED NOT DETECTED Final   Mycoplasma pneumoniae NOT DETECTED NOT DETECTED Final    Comment: Performed at [facility], [address], [place], [place] [address]     Total time spend on discharging this patient, including the last patient exam, discussing the hospital stay, instructions for ongoing care as it relates to all pertinent caregivers, as well as preparing the medical discharge records, prescriptions, and/or referrals as applicable, is 45 minutes.    Ellouise Haber, MD  [facility] 09/04/2024, 11:12 AM       [1]  Allergies Allergen Reactions   Advair Diskus [Fluticasone-Salmeterol] Shortness Of Breath   Erythromycin Nausea And Vomiting   Iodinated Contrast Media Shortness Of Breath and Other (See Comments)   Ativan  [Lorazepam ] Other (See Comments)    Caused pt's heart to stop   Cephalexin Other (See Comments)    Heart Burn   Darvocet [Propoxyphene N-Acetaminophen ] Nausea And Vomiting   Doxycycline Hyclate Nausea And Vomiting, Swelling and Other (See Comments)    Tongue swelling, severe depression   Hydroxychloroquine Other (See Comments)    Restless leg   Propoxyphene Nausea And Vomiting   Septra [Sulfamethoxazole-Trimethoprim] Rash   "

## 2024-09-04 NOTE — Plan of Care (Signed)

## 2024-09-07 LAB — CULTURE, RESPIRATORY W GRAM STAIN: Culture: NORMAL

## 2024-09-15 ENCOUNTER — Other Ambulatory Visit: Payer: Self-pay

## 2024-09-15 ENCOUNTER — Emergency Department

## 2024-09-15 ENCOUNTER — Observation Stay
Admission: EM | Admit: 2024-09-15 | Discharge: 2024-09-17 | DRG: 871 | Disposition: A | Attending: Internal Medicine | Admitting: Internal Medicine

## 2024-09-15 DIAGNOSIS — N179 Acute kidney failure, unspecified: Secondary | ICD-10-CM | POA: Diagnosis not present

## 2024-09-15 DIAGNOSIS — J449 Chronic obstructive pulmonary disease, unspecified: Secondary | ICD-10-CM | POA: Diagnosis present

## 2024-09-15 DIAGNOSIS — F411 Generalized anxiety disorder: Secondary | ICD-10-CM | POA: Diagnosis present

## 2024-09-15 DIAGNOSIS — E1151 Type 2 diabetes mellitus with diabetic peripheral angiopathy without gangrene: Secondary | ICD-10-CM | POA: Diagnosis present

## 2024-09-15 DIAGNOSIS — M069 Rheumatoid arthritis, unspecified: Secondary | ICD-10-CM | POA: Diagnosis present

## 2024-09-15 DIAGNOSIS — J8410 Pulmonary fibrosis, unspecified: Secondary | ICD-10-CM | POA: Diagnosis present

## 2024-09-15 DIAGNOSIS — Z7952 Long term (current) use of systemic steroids: Secondary | ICD-10-CM

## 2024-09-15 DIAGNOSIS — R404 Transient alteration of awareness: Secondary | ICD-10-CM

## 2024-09-15 DIAGNOSIS — I480 Paroxysmal atrial fibrillation: Secondary | ICD-10-CM | POA: Diagnosis present

## 2024-09-15 DIAGNOSIS — Z8673 Personal history of transient ischemic attack (TIA), and cerebral infarction without residual deficits: Secondary | ICD-10-CM

## 2024-09-15 DIAGNOSIS — Z7902 Long term (current) use of antithrombotics/antiplatelets: Secondary | ICD-10-CM

## 2024-09-15 DIAGNOSIS — E039 Hypothyroidism, unspecified: Secondary | ICD-10-CM | POA: Diagnosis present

## 2024-09-15 DIAGNOSIS — Z823 Family history of stroke: Secondary | ICD-10-CM

## 2024-09-15 DIAGNOSIS — N390 Urinary tract infection, site not specified: Secondary | ICD-10-CM | POA: Diagnosis present

## 2024-09-15 DIAGNOSIS — Z8249 Family history of ischemic heart disease and other diseases of the circulatory system: Secondary | ICD-10-CM

## 2024-09-15 DIAGNOSIS — Z87891 Personal history of nicotine dependence: Secondary | ICD-10-CM

## 2024-09-15 DIAGNOSIS — G9341 Metabolic encephalopathy: Secondary | ICD-10-CM | POA: Diagnosis present

## 2024-09-15 DIAGNOSIS — Z982 Presence of cerebrospinal fluid drainage device: Secondary | ICD-10-CM

## 2024-09-15 DIAGNOSIS — J9611 Chronic respiratory failure with hypoxia: Secondary | ICD-10-CM | POA: Diagnosis present

## 2024-09-15 DIAGNOSIS — Z833 Family history of diabetes mellitus: Secondary | ICD-10-CM

## 2024-09-15 DIAGNOSIS — Z79890 Hormone replacement therapy (postmenopausal): Secondary | ICD-10-CM

## 2024-09-15 DIAGNOSIS — G9340 Encephalopathy, unspecified: Principal | ICD-10-CM | POA: Diagnosis present

## 2024-09-15 DIAGNOSIS — Z7984 Long term (current) use of oral hypoglycemic drugs: Secondary | ICD-10-CM

## 2024-09-15 DIAGNOSIS — A419 Sepsis, unspecified organism: Principal | ICD-10-CM | POA: Diagnosis present

## 2024-09-15 DIAGNOSIS — Z888 Allergy status to other drugs, medicaments and biological substances status: Secondary | ICD-10-CM

## 2024-09-15 DIAGNOSIS — Z9981 Dependence on supplemental oxygen: Secondary | ICD-10-CM

## 2024-09-15 DIAGNOSIS — I129 Hypertensive chronic kidney disease with stage 1 through stage 4 chronic kidney disease, or unspecified chronic kidney disease: Secondary | ICD-10-CM | POA: Diagnosis present

## 2024-09-15 DIAGNOSIS — N1831 Chronic kidney disease, stage 3a: Secondary | ICD-10-CM | POA: Diagnosis present

## 2024-09-15 DIAGNOSIS — E7800 Pure hypercholesterolemia, unspecified: Secondary | ICD-10-CM | POA: Diagnosis present

## 2024-09-15 DIAGNOSIS — E1122 Type 2 diabetes mellitus with diabetic chronic kidney disease: Secondary | ICD-10-CM | POA: Diagnosis present

## 2024-09-15 DIAGNOSIS — Z7982 Long term (current) use of aspirin: Secondary | ICD-10-CM

## 2024-09-15 DIAGNOSIS — Z79899 Other long term (current) drug therapy: Secondary | ICD-10-CM

## 2024-09-15 DIAGNOSIS — F32A Depression, unspecified: Secondary | ICD-10-CM | POA: Diagnosis present

## 2024-09-15 DIAGNOSIS — R4182 Altered mental status, unspecified: Secondary | ICD-10-CM | POA: Diagnosis present

## 2024-09-15 DIAGNOSIS — J9612 Chronic respiratory failure with hypercapnia: Secondary | ICD-10-CM | POA: Diagnosis present

## 2024-09-15 DIAGNOSIS — N400 Enlarged prostate without lower urinary tract symptoms: Secondary | ICD-10-CM | POA: Diagnosis present

## 2024-09-15 DIAGNOSIS — W19XXXA Unspecified fall, initial encounter: Secondary | ICD-10-CM | POA: Insufficient documentation

## 2024-09-15 DIAGNOSIS — Z91041 Radiographic dye allergy status: Secondary | ICD-10-CM

## 2024-09-15 LAB — COMPREHENSIVE METABOLIC PANEL WITH GFR
ALT: 15 U/L (ref 0–44)
AST: 20 U/L (ref 15–41)
Albumin: 4 g/dL (ref 3.5–5.0)
Alkaline Phosphatase: 73 U/L (ref 38–126)
Anion gap: 12 (ref 5–15)
BUN: 29 mg/dL — ABNORMAL HIGH (ref 8–23)
CO2: 30 mmol/L (ref 22–32)
Calcium: 10 mg/dL (ref 8.9–10.3)
Chloride: 97 mmol/L — ABNORMAL LOW (ref 98–111)
Creatinine, Ser: 1.6 mg/dL — ABNORMAL HIGH (ref 0.61–1.24)
GFR, Estimated: 47 mL/min — ABNORMAL LOW
Glucose, Bld: 138 mg/dL — ABNORMAL HIGH (ref 70–99)
Potassium: 4.1 mmol/L (ref 3.5–5.1)
Sodium: 139 mmol/L (ref 135–145)
Total Bilirubin: 0.3 mg/dL (ref 0.0–1.2)
Total Protein: 7.1 g/dL (ref 6.5–8.1)

## 2024-09-15 LAB — URINALYSIS, ROUTINE W REFLEX MICROSCOPIC
Bacteria, UA: NONE SEEN
Bilirubin Urine: NEGATIVE
Glucose, UA: 50 mg/dL — AB
Ketones, ur: NEGATIVE mg/dL
Leukocytes,Ua: NEGATIVE
Nitrite: POSITIVE — AB
Protein, ur: 30 mg/dL — AB
Specific Gravity, Urine: 1.026 (ref 1.005–1.030)
Squamous Epithelial / HPF: 0 /HPF (ref 0–5)
pH: 5 (ref 5.0–8.0)

## 2024-09-15 LAB — TSH: TSH: 6.53 u[IU]/mL — ABNORMAL HIGH (ref 0.350–4.500)

## 2024-09-15 LAB — CBC WITH DIFFERENTIAL/PLATELET
Abs Immature Granulocytes: 0.36 10*3/uL — ABNORMAL HIGH (ref 0.00–0.07)
Basophils Absolute: 0.1 10*3/uL (ref 0.0–0.1)
Basophils Relative: 0 %
Eosinophils Absolute: 0.1 10*3/uL (ref 0.0–0.5)
Eosinophils Relative: 1 %
HCT: 34.8 % — ABNORMAL LOW (ref 39.0–52.0)
Hemoglobin: 11.7 g/dL — ABNORMAL LOW (ref 13.0–17.0)
Immature Granulocytes: 2 %
Lymphocytes Relative: 11 %
Lymphs Abs: 1.8 10*3/uL (ref 0.7–4.0)
MCH: 30.5 pg (ref 26.0–34.0)
MCHC: 33.6 g/dL (ref 30.0–36.0)
MCV: 90.6 fL (ref 80.0–100.0)
Monocytes Absolute: 1.2 10*3/uL — ABNORMAL HIGH (ref 0.1–1.0)
Monocytes Relative: 7 %
Neutro Abs: 13.8 10*3/uL — ABNORMAL HIGH (ref 1.7–7.7)
Neutrophils Relative %: 79 %
Platelets: 249 10*3/uL (ref 150–400)
RBC: 3.84 MIL/uL — ABNORMAL LOW (ref 4.22–5.81)
RDW: 13.9 % (ref 11.5–15.5)
WBC: 17.3 10*3/uL — ABNORMAL HIGH (ref 4.0–10.5)
nRBC: 0 % (ref 0.0–0.2)

## 2024-09-15 LAB — AMMONIA: Ammonia: 23 umol/L (ref 9–35)

## 2024-09-15 LAB — LIPASE, BLOOD: Lipase: 12 U/L (ref 11–51)

## 2024-09-15 LAB — URINE DRUG SCREEN
Amphetamines: NEGATIVE
Barbiturates: NEGATIVE
Benzodiazepines: POSITIVE — AB
Cocaine: NEGATIVE
Fentanyl: NEGATIVE
Methadone Scn, Ur: NEGATIVE
Opiates: POSITIVE — AB
Tetrahydrocannabinol: NEGATIVE

## 2024-09-15 LAB — BLOOD GAS, VENOUS
Acid-Base Excess: 4.9 mmol/L — ABNORMAL HIGH (ref 0.0–2.0)
Bicarbonate: 32.7 mmol/L — ABNORMAL HIGH (ref 20.0–28.0)
O2 Saturation: 29.1 %
Patient temperature: 37
pCO2, Ven: 62 mmHg — ABNORMAL HIGH (ref 44–60)
pH, Ven: 7.33 (ref 7.25–7.43)

## 2024-09-15 LAB — GLUCOSE, CAPILLARY
Glucose-Capillary: 178 mg/dL — ABNORMAL HIGH (ref 70–99)
Glucose-Capillary: 122 mg/dL — ABNORMAL HIGH (ref 70–99)

## 2024-09-15 LAB — LACTIC ACID, PLASMA: Lactic Acid, Venous: 2.4 mmol/L (ref 0.5–1.9)

## 2024-09-15 LAB — PROCALCITONIN: Procalcitonin: 1.58 ng/mL

## 2024-09-15 LAB — MAGNESIUM: Magnesium: 1.3 mg/dL — ABNORMAL LOW (ref 1.7–2.4)

## 2024-09-15 LAB — T4, FREE: Free T4: 1.2 ng/dL (ref 0.80–2.00)

## 2024-09-15 MED ORDER — ROPINIROLE HCL 0.25 MG PO TABS
0.5000 mg | ORAL_TABLET | Freq: Every day | ORAL | Status: DC
  Administered 2024-09-15 – 2024-09-16 (×2): 0.5 mg via ORAL
  Filled 2024-09-15 (×2): qty 2

## 2024-09-15 MED ORDER — SODIUM CHLORIDE 0.9 % IV SOLN: INTRAVENOUS | Status: DC

## 2024-09-15 MED ORDER — MAGNESIUM SULFATE 2 GM/50ML IV SOLN
2.0000 g | Freq: Once | INTRAVENOUS | Status: AC
  Administered 2024-09-15: 2 g via INTRAVENOUS
  Filled 2024-09-15: qty 50

## 2024-09-15 MED ORDER — ARIPIPRAZOLE 5 MG PO TABS
5.0000 mg | ORAL_TABLET | Freq: Every day | ORAL | Status: DC
  Administered 2024-09-15 – 2024-09-17 (×3): 5 mg via ORAL
  Filled 2024-09-15 (×3): qty 1

## 2024-09-15 MED ORDER — SODIUM CHLORIDE 0.9 % IV SOLN
1.0000 g | INTRAVENOUS | Status: DC
  Administered 2024-09-16 – 2024-09-17 (×2): 1 g via INTRAVENOUS
  Filled 2024-09-15 (×2): qty 10

## 2024-09-15 MED ORDER — PREDNISONE 10 MG PO TABS
10.0000 mg | ORAL_TABLET | Freq: Every day | ORAL | Status: DC
  Administered 2024-09-16 – 2024-09-17 (×2): 10 mg via ORAL
  Filled 2024-09-15 (×2): qty 1

## 2024-09-15 MED ORDER — BISACODYL 5 MG PO TBEC
5.0000 mg | DELAYED_RELEASE_TABLET | Freq: Every day | ORAL | Status: DC | PRN
  Administered 2024-09-15 – 2024-09-16 (×2): 5 mg via ORAL
  Filled 2024-09-15 (×2): qty 1

## 2024-09-15 MED ORDER — CLOPIDOGREL BISULFATE 75 MG PO TABS
75.0000 mg | ORAL_TABLET | Freq: Every day | ORAL | Status: DC
  Administered 2024-09-15 – 2024-09-17 (×3): 75 mg via ORAL
  Filled 2024-09-15 (×3): qty 1

## 2024-09-15 MED ORDER — METOPROLOL SUCCINATE ER 25 MG PO TB24
12.5000 mg | ORAL_TABLET | Freq: Every day | ORAL | Status: DC
  Administered 2024-09-15 – 2024-09-17 (×3): 12.5 mg via ORAL
  Filled 2024-09-15 (×3): qty 1

## 2024-09-15 MED ORDER — IPRATROPIUM-ALBUTEROL 0.5-2.5 (3) MG/3ML IN SOLN
3.0000 mL | Freq: Four times a day (QID) | RESPIRATORY_TRACT | Status: DC
  Administered 2024-09-15: 3 mL via RESPIRATORY_TRACT
  Filled 2024-09-15: qty 3

## 2024-09-15 MED ORDER — INSULIN ASPART 100 UNIT/ML IJ SOLN
0.0000 [IU] | Freq: Three times a day (TID) | INTRAMUSCULAR | Status: DC
  Administered 2024-09-15 – 2024-09-16 (×3): 2 [IU] via SUBCUTANEOUS
  Administered 2024-09-17: 1 [IU] via SUBCUTANEOUS
  Filled 2024-09-15 (×2): qty 2
  Filled 2024-09-15: qty 1
  Filled 2024-09-15: qty 2

## 2024-09-15 MED ORDER — ALBUTEROL SULFATE (2.5 MG/3ML) 0.083% IN NEBU: 2.5000 mg | INHALATION_SOLUTION | Freq: Four times a day (QID) | RESPIRATORY_TRACT | Status: DC | PRN

## 2024-09-15 MED ORDER — ONDANSETRON HCL 4 MG PO TABS: 4.0000 mg | ORAL_TABLET | Freq: Four times a day (QID) | ORAL | Status: DC | PRN

## 2024-09-15 MED ORDER — ACETAMINOPHEN 325 MG PO TABS
650.0000 mg | ORAL_TABLET | Freq: Four times a day (QID) | ORAL | Status: DC | PRN
  Administered 2024-09-16: 650 mg via ORAL
  Filled 2024-09-15: qty 2

## 2024-09-15 MED ORDER — ASPIRIN 81 MG PO TBEC
81.0000 mg | DELAYED_RELEASE_TABLET | Freq: Every day | ORAL | Status: DC
  Administered 2024-09-15 – 2024-09-17 (×3): 81 mg via ORAL
  Filled 2024-09-15 (×3): qty 1

## 2024-09-15 MED ORDER — NERANDOMILAST 18 MG PO TABS: 18.0000 mg | ORAL_TABLET | Freq: Every day | ORAL | Status: DC

## 2024-09-15 MED ORDER — TRAZODONE HCL 50 MG PO TABS: 25.0000 mg | ORAL_TABLET | Freq: Every evening | ORAL | Status: DC | PRN

## 2024-09-15 MED ORDER — ENSURE MAX PROTEIN PO LIQD
11.0000 [oz_av] | Freq: Two times a day (BID) | ORAL | Status: DC
  Administered 2024-09-15: 11 [oz_av] via ORAL

## 2024-09-15 MED ORDER — MORPHINE SULFATE ER 15 MG PO TBCR
15.0000 mg | EXTENDED_RELEASE_TABLET | Freq: Two times a day (BID) | ORAL | Status: DC
  Administered 2024-09-15 – 2024-09-17 (×5): 15 mg via ORAL
  Filled 2024-09-15 (×5): qty 1

## 2024-09-15 MED ORDER — SODIUM CHLORIDE 0.9 % IV SOLN
1.0000 g | Freq: Once | INTRAVENOUS | Status: AC
  Administered 2024-09-15: 1 g via INTRAVENOUS
  Filled 2024-09-15: qty 10

## 2024-09-15 MED ORDER — VENLAFAXINE HCL ER 75 MG PO CP24
150.0000 mg | ORAL_CAPSULE | Freq: Every day | ORAL | Status: DC
  Administered 2024-09-16 – 2024-09-17 (×2): 150 mg via ORAL
  Filled 2024-09-15 (×2): qty 2

## 2024-09-15 MED ORDER — ACETAMINOPHEN 650 MG RE SUPP: 650.0000 mg | Freq: Four times a day (QID) | RECTAL | Status: DC | PRN

## 2024-09-15 MED ORDER — MIRTAZAPINE 15 MG PO TABS
45.0000 mg | ORAL_TABLET | Freq: Every day | ORAL | Status: DC
  Administered 2024-09-15 – 2024-09-16 (×2): 45 mg via ORAL
  Filled 2024-09-15 (×2): qty 3

## 2024-09-15 MED ORDER — ONDANSETRON HCL 4 MG/2ML IJ SOLN: 4.0000 mg | Freq: Four times a day (QID) | INTRAMUSCULAR | Status: DC | PRN

## 2024-09-15 MED ORDER — PANTOPRAZOLE SODIUM 40 MG PO TBEC
40.0000 mg | DELAYED_RELEASE_TABLET | Freq: Two times a day (BID) | ORAL | Status: DC
  Administered 2024-09-15 – 2024-09-17 (×4): 40 mg via ORAL
  Filled 2024-09-15 (×4): qty 1

## 2024-09-15 MED ORDER — ENOXAPARIN SODIUM 40 MG/0.4ML IJ SOSY
40.0000 mg | PREFILLED_SYRINGE | INTRAMUSCULAR | Status: DC
  Administered 2024-09-15 – 2024-09-16 (×2): 40 mg via SUBCUTANEOUS
  Filled 2024-09-15 (×2): qty 0.4

## 2024-09-15 MED ORDER — DIAZEPAM 2 MG PO TABS
2.0000 mg | ORAL_TABLET | Freq: Two times a day (BID) | ORAL | Status: DC
  Administered 2024-09-15 – 2024-09-17 (×5): 2 mg via ORAL
  Filled 2024-09-15 (×5): qty 1

## 2024-09-15 MED ORDER — TAMSULOSIN HCL 0.4 MG PO CAPS
0.8000 mg | ORAL_CAPSULE | Freq: Every day | ORAL | Status: DC
  Administered 2024-09-15 – 2024-09-16 (×2): 0.8 mg via ORAL
  Filled 2024-09-15 (×2): qty 2

## 2024-09-15 MED ORDER — DOCUSATE SODIUM 100 MG PO CAPS: 100.0000 mg | ORAL_CAPSULE | Freq: Every day | ORAL | Status: DC | PRN

## 2024-09-15 MED ORDER — ATORVASTATIN CALCIUM 20 MG PO TABS
20.0000 mg | ORAL_TABLET | Freq: Every day | ORAL | Status: DC
  Administered 2024-09-15 – 2024-09-16 (×2): 20 mg via ORAL
  Filled 2024-09-15 (×2): qty 1

## 2024-09-15 MED ORDER — LEVOTHYROXINE SODIUM 100 MCG PO TABS
100.0000 ug | ORAL_TABLET | Freq: Every morning | ORAL | Status: DC
  Administered 2024-09-16 – 2024-09-17 (×2): 100 ug via ORAL
  Filled 2024-09-15 (×2): qty 1

## 2024-09-15 MED ORDER — BUDESON-GLYCOPYRROL-FORMOTEROL 160-9-4.8 MCG/ACT IN AERO
2.0000 | INHALATION_SPRAY | Freq: Two times a day (BID) | RESPIRATORY_TRACT | Status: DC
  Administered 2024-09-15 – 2024-09-16 (×3): 2 via RESPIRATORY_TRACT
  Filled 2024-09-15: qty 5.9

## 2024-09-15 NOTE — ED Triage Notes (Signed)
 Pt coming from home via EMS d/t unwitnessed fall. Pt reporting neck pain. Pt family reports he's been lethargic for 2-3 days, with decreased urination and BM frequency. Hx of stroke. EMS reports Afib 100-120 HR. Pt is 4L at base.

## 2024-09-15 NOTE — ED Provider Notes (Signed)
 "  [facility] Provider Note    Event Date/Time   First MD Initiated Contact with Patient 09/15/24 [phone]     (approximate)   History   Fall   HPI  Kirk Robinson is a 69 y.o. 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  History is largely obtained by patient's wife via telephone and EMS as patient is incapable of contributing       Physical Exam   Triage Vital Signs: ED Triage Vitals  Encounter Vitals Group     BP      Girls Systolic BP Percentile      Girls Diastolic BP Percentile      Boys Systolic BP Percentile      Boys Diastolic BP Percentile      Pulse      Resp      Temp      Temp src      SpO2      Weight      Height      Head Circumference      Peak Flow      Pain Score      Pain Loc      Pain Education      Exclude from Growth Chart     Most recent vital signs: Vitals:   09/15/24 1340 09/15/24 1613  BP:  103/71  Pulse:  82  Resp:    Temp:  98 F (36.7 C)  SpO2: 94%     Nursing Triage Note reviewed. Vital signs reviewed and patients oxygen saturation is normoxic  General: Patient is well nourished, well developed, awake and alert, appears chronically ill Head: Normocephalic and atraumatic Eyes: Normal inspection, extraocular muscles intact, no conjunctival pallor Ear, nose, throat: Normal external exam Neck: Placed in cervical collar Respiratory: Patient is in no respiratory distress, lungs CTAB Cardiovascular: Patient is tachycardic, RR without murmur appreciated GI: Abd SNT with no guarding or rebound  Back: Normal inspection of the back with good strength and range of motion throughout all ext Extremities: pulses intact with  good cap refills, no LE pitting edema or calf tenderness Neuro: The patient is alert and oriented to person, not to place or time, is able to move all extremities  ED Results / Procedures / Treatments   Labs (all labs ordered are listed, but only abnormal results are displayed) Labs Reviewed  CBC WITH DIFFERENTIAL/PLATELET - Abnormal; Notable for the following components:      Result Value   WBC 17.3 (*)    RBC 3.84 (*)    Hemoglobin 11.7 (*)    HCT 34.8 (*)    Neutro Abs 13.8 (*)    Monocytes Absolute 1.2 (*)    Abs Immature Granulocytes 0.36 (*)    All other components within normal limits  COMPREHENSIVE METABOLIC PANEL WITH GFR - Abnormal; Notable for the following components:   Chloride 97 (*)    Glucose, Bld 138 (*)    BUN 29 (*)    Creatinine, Ser 1.60 (*)    GFR, Estimated 47 (*)    All other components within normal limits  TSH - Abnormal; Notable for the following components:   TSH 6.530 (*)    All other  components within normal limits  BLOOD GAS, VENOUS - Abnormal; Notable for the following components:   pCO2, Ven 62 (*)    Bicarbonate 32.7 (*)    Acid-Base Excess 4.9 (*)    All other components within normal limits  LACTIC ACID, PLASMA - Abnormal; Notable for the following components:   Lactic Acid, Venous 2.4 (*)    All other components within normal limits  MAGNESIUM  - Abnormal; Notable for the following components:   Magnesium  1.3 (*)    All other components within normal limits  URINALYSIS, ROUTINE W REFLEX MICROSCOPIC - Abnormal; Notable for the following components:   Color, Urine YELLOW (*)    APPearance HAZY (*)    Glucose, UA 50 (*)    Hgb urine dipstick MODERATE (*)    Protein, ur 30 (*)    Nitrite POSITIVE (*)    All other components within normal limits  URINE DRUG SCREEN - Abnormal; Notable for the following components:   Opiates POSITIVE (*)    Benzodiazepines POSITIVE (*)    All other components within normal limits  GLUCOSE, CAPILLARY -  Abnormal; Notable for the following components:   Glucose-Capillary 178 (*)    All other components within normal limits  CULTURE, BLOOD (ROUTINE X 2)  CULTURE, BLOOD (ROUTINE X 2)  URINE CULTURE  LIPASE, BLOOD  T4, FREE  PROCALCITONIN  AMMONIA  HEMOGLOBIN A1C  BASIC METABOLIC PANEL WITH GFR  CBC     EKG EKG and rhythm strip are interpreted by myself:   EKG: [Normal sinus rhythm] at heart rate of 94, normal QRS duration, QTc 401, nonspecific ST segments and T waves no ectopy EKG not consistent with Acute STEMI Rhythm strip: NSR in lead II   RADIOLOGY CT head: No acute abnormality on my independent review interpretation radiologist agrees X-ray shunt series: No acute abnormalities    PROCEDURES:  Critical Care performed: No  Procedures   MEDICATIONS ORDERED IN ED: Medications  aspirin  EC tablet 81 mg (81 mg Oral Given 09/15/24 1308)  morphine  (MS CONTIN ) 12 hr tablet 15 mg (15 mg Oral Given 09/15/24 1309)  atorvastatin  (LIPITOR) tablet 20 mg (20 mg Oral Given 09/15/24 1715)  metoprolol  succinate (TOPROL -XL) 24 hr tablet 12.5 mg (12.5 mg Oral Given 09/15/24 1308)  ARIPiprazole  (ABILIFY ) tablet 5 mg (5 mg Oral Given 09/15/24 1443)  diazepam  (VALIUM ) tablet 2 mg (2 mg Oral Given 09/15/24 1309)  mirtazapine  (REMERON ) tablet 45 mg (has no administration in time range)  venlafaxine  XR (EFFEXOR -XR) 24 hr capsule 150 mg (has no administration in time range)  levothyroxine  (SYNTHROID ) tablet 100 mcg (has no administration in time range)  predniSONE  (DELTASONE ) tablet 10 mg (has no administration in time range)  docusate sodium  (COLACE) capsule 100 mg (has no administration in time range)  pantoprazole  (PROTONIX ) EC tablet 40 mg (40 mg Oral Given 09/15/24 1715)  tamsulosin  (FLOMAX ) capsule 0.8 mg (0.8 mg Oral Given 09/15/24 1715)  clopidogrel  (PLAVIX ) tablet 75 mg (75 mg Oral Given 09/15/24 1309)  rOPINIRole  (REQUIP ) tablet 0.5 mg (has no administration in time range)  protein  supplement (ENSURE MAX) liquid (11 oz Oral Patient Refused/Not Given 09/15/24 1309)  albuterol  (PROVENTIL ) (2.5 MG/3ML) 0.083% nebulizer solution 2.5 mg (has no administration in time range)  budesonide -glycopyrrolate -formoterol  (BREZTRI ) 160-9-4.8 MCG/ACT inhaler 2 puff (2 puffs Inhalation Patient Refused/Not Given 09/15/24 1310)  nerandomilast  (JASCAYD ) tablet 18 mg (18 mg Oral Not Given 09/15/24 1314)  enoxaparin  (LOVENOX ) injection 40 mg (has no administration in time range)  acetaminophen  (  TYLENOL ) tablet 650 mg (has no administration in time range)    Or  acetaminophen  (TYLENOL ) suppository 650 mg (has no administration in time range)  traZODone  (DESYREL ) tablet 25 mg (has no administration in time range)  ondansetron  (ZOFRAN ) tablet 4 mg (has no administration in time range)    Or  ondansetron  (ZOFRAN ) injection 4 mg (has no administration in time range)  bisacodyl  (DULCOLAX) EC tablet 5 mg (5 mg Oral Given 09/15/24 1443)  cefTRIAXone  (ROCEPHIN ) 1 g in sodium chloride  0.9 % 100 mL IVPB (has no administration in time range)  0.9 %  sodium chloride  infusion ( Intravenous Infusion Verify 09/15/24 1801)  insulin  aspart (novoLOG ) injection 0-9 Units (2 Units Subcutaneous Given 09/15/24 1715)  cefTRIAXone  (ROCEPHIN ) 1 g in sodium chloride  0.9 % 100 mL IVPB (0 g Intravenous Stopped 09/15/24 1131)  magnesium  sulfate IVPB 2 g 50 mL (0 g Intravenous Stopped 09/15/24 1252)     IMPRESSION / MDM / ASSESSMENT AND PLAN / ED COURSE                                Differential diagnosis includes, but is not limited to, intracranial hemorrhage, cervical spine fracture, anemia, UTI, electrolyte derangement, arrhythmia, sepsis  ED course: Patient presents with limited history and further history is obtained by calling patient's wife.  On exam I do not appreciate any focal neurological deficits.  Given the history of fall, patient did have CT imaging of his head and C-spine which was unremarkable.  Shunt series  was unremarkable as well.  Patient technically did meet sepsis criteria and I did initiate antibiotics with ceftriaxone  for presumed urinary infection.  Case discussed with hospitalist for admission   Clinical Course as of 09/15/24 8171  [place] Sep 15, 2024  0911 Called wife: Nilsa became confused, withdrawn, not eating, endorsing constipation, During the night he got up and fell off toilet and found laying on the floor close to 2 hours. Baseline is A&O x3 with mild confusion. Moves all extremities well. VP shunt put in 2015: [facility], Told non-functioning 8-9 years ago but has not needed to follow up since.  [HD]  0915 WBC(!): 17.3 Just finished steroids 3 days ago, on regular dose 10 mg a day.  [HD]  [id number] CT Cervical Spine Wo Contrast No acute abnormality visualized [HD]  0944 CT Head Wo Contrast No acute abnormality [HD]  0950 Patient is able to move bilateral legs and bilateral arms.  No C-spine tenderness and with the CT results I did remove the cervical collar [HD]  1002 DG Abd 1 View Some mild constipation but no acute [HD]  1002 DG Chest 1 View Unremarkable [HD]  1002 DG Skull 1-3 Views Unremarkable [HD]  1002 Lactic acid, plasma(!!) Given this, his elevated white blood cell count his tachycardia he does meet sepsis criteria and will administer oral antibiotics [HD]  1025 Magnesium (!): 1.3 Will replete [HD]  1034 Discussed with hospitalist for admission [HD]    Clinical Course User Index [HD] Nicholaus Rolland BRAVO, MD   -- Risk: 5 This patient has a high risk of morbidity due to further diagnostic testing or treatment. Rationale: This patients evaluation and management involve a high risk of morbidity due to the potential severity of presenting symptoms, need for diagnostic testing, and/or initiation of treatment that may require close monitoring. The differential includes conditions with potential for significant deterioration or requiring escalation of care. Treatment  decisions  in the ED, including medication administration, procedural interventions, or disposition planning, reflect this level of risk. COPA: 5 The patient has the following acute or chronic illness/injury that poses a possible threat to life or bodily function: [X] : The patient has a potentially serious acute condition or an acute exacerbation of a chronic illness requiring urgent evaluation and management in the Emergency Department. The clinical presentation necessitates immediate consideration of life-threatening or function-threatening diagnoses, even if they are ultimately ruled out.   FINAL CLINICAL IMPRESSION(S) / ED DIAGNOSES   Final diagnoses:  Acute encephalopathy  Fall, initial encounter  Sepsis, due to unspecified organism, unspecified whether acute organ dysfunction present [facility])     Rx / DC Orders   ED Discharge Orders     None        Note:  This document was prepared using Dragon voice recognition software and may include unintentional dictation errors.   Nicholaus Rolland BRAVO, MD 09/15/24 1828  "

## 2024-09-15 NOTE — Plan of Care (Signed)
   Problem: Health Behavior/Discharge Planning: Goal: Ability to manage health-related needs will improve Outcome: Progressing   Problem: Nutrition: Goal: Adequate nutrition will be maintained Outcome: Progressing   Problem: Coping: Goal: Level of anxiety will decrease Outcome: Progressing   Problem: Pain Managment: Goal: General experience of comfort will improve and/or be controlled Outcome: Progressing

## 2024-09-15 NOTE — H&P (Signed)
 " History and Physical    Alp Leamer FMW:[id number] DOB: 08-Dec-1955 DOA: 09/15/2024  PCP: Claudene Rayfield HERO, MD (Confirm with patient/family/NH records and if not entered, this has to be entered at [facility] point of entry) Patient coming from: Home  I have personally briefly reviewed patient's old medical records in [facility]  Chief Complaint: Fall, confusion  HPI: Gurjot Defeo is a 69 y.o. 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  Patient is confused and unable to provide any history, instead, all history reported by patient wife over the phone.  Wife reported and patient started to appear lethargic since yesterday and has been eating and drinking very little and she wondering whether patient was dehydrated.  And  this morning, patient went to bathroom and fell down on the floor.  When wife found him about 1 hour later it appears the patient was very confused.  Wife also reported that patient was wobbly this morning.  Patient remembers that he fell lightheadedness this morning and fell in the bathroom but denied any LOC, no neck or head injury.  Denied any limb or torso pain.  Patient also told me that he has had a intermittent cough with white to yellowish sputum and the last 2 days has been feeling cramping-like pain along the lower abdomen.  ED Course: Afebrile, nontachycardic blood pressure 129/85 O2 saturation 96% on 4 L.  UA showed 2+ WBC 1+ RBC, VBG 7.30 3/60/30 8K6.4 WBC 17, hemoglobin 11.7 BUN 29 creatinine 1.6.  Patient was given  ceftriaxone  in the ED.    Review of Systems: Unable to perform, patient is  confused.   Past Medical History:  Diagnosis Date   Alcohol use 03/16/2007   patient risk factors   Allergic rhinitis, cause unspecified    Altered mental status    Anxiety state, unspecified    Cervicalgia 09/17/2007   COPD (chronic obstructive pulmonary disease) (HCC)    Diabetes mellitus without complication (HCC)    Diplopia 12/21/2007   Esophageal reflux    Gastritis 05/13/2009   HCAP (healthcare-associated pneumonia) 08/12/2014   Hematuria 03/16/2007   Hyperthyroidism    graves disease   Iodine  hypothyroidism    Memory loss    NSIP (nonspecific interstitial pneumonia) (HCC)    on azathioprine    Osteoporosis, unspecified    bone density per Morivati in 2012   Other abnormal glucose 04/08/2008   Other diseases of lung, not elsewhere classified 01/23/2009   s/p Fleming/pulmonology consult; intolerant  to Advair   Peripheral vascular disease, unspecified 12/24/2009   Personal history of colonic polyps    Pneumonia, organism unspecified(486)    Pure hypercholesterolemia    Rheumatoid arthritis (HCC)    Shortness of breath    Stroke (HCC) 08/23/2003   Tobacco use disorder 03/16/2007   patient risk factors   Unspecified hypothyroidism 01/07/2010   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  CT head negative, MRI brain negative, EEG: diffuse slowing but no  seizure acitivity.  Labs negative.  [facility].   BRAIN SURGERY  2005   ventricular shunt  in brain   COLONOSCOPY WITH PROPOFOL  N/A 03/10/2020   Procedure: COLONOSCOPY WITH PROPOFOL ;  Surgeon: Therisa Bi, MD;  Location: [facility] ENDOSCOPY;  Service: Gastroenterology;  Laterality: N/A;   COLONOSCOPY WITH PROPOFOL  N/A 03/16/2021   Procedure: COLONOSCOPY WITH PROPOFOL ;  Surgeon: Therisa Bi, MD;  Location: [facility] ENDOSCOPY;  Service: Gastroenterology;  Laterality: N/A;   COLONOSCOPY WITH  PROPOFOL  N/A 06/22/2022   Procedure: COLONOSCOPY WITH PROPOFOL ;  Surgeon: Therisa Bi, MD;  Location: [facility] ENDOSCOPY;  Service: Endoscopy;  Laterality: N/A;   ESOPHAGOGASTRODUODENOSCOPY (EGD) WITH PROPOFOL  N/A 03/16/2021   Procedure: ESOPHAGOGASTRODUODENOSCOPY (EGD) WITH PROPOFOL ;  Surgeon: Therisa Bi, MD;  Location: [facility] ENDOSCOPY;  Service: Gastroenterology;  Laterality: N/A;   EYE SURGERY  2010   GIVENS CAPSULE STUDY N/A 04/16/2021   Procedure: GIVENS CAPSULE STUDY;  Surgeon: Therisa Bi, MD;  Location: [facility] ENDOSCOPY;  Service: Gastroenterology;  Laterality: N/A;   LAPAROSCOPIC REVISION VENTRICULAR-PERITONEAL (V-P) SHUNT Right 05/20/2014   Procedure: LAPAROSCOPIC REVISION VENTRICULAR-PERITONEAL (V-P) SHUNT;  Surgeon: Dann Hummer, MD;  Location: [facility] NEURO ORS;  Service: General;  Laterality: Right;   LOWER EXTREMITY ANGIOGRAPHY Left 06/11/2020   Procedure: LOWER EXTREMITY ANGIOGRAPHY;  Surgeon: Marea Selinda RAMAN, MD;  Location: [facility] INVASIVE CV LAB;  Service: Cardiovascular;  Laterality: Left;   Neuropsychiatric evaluation  04/2012   poor short-term memory, poor recall, delayed reaction time; permanently disabled.       reports that he quit smoking about 4 years ago. His smoking use included cigarettes. He started smoking about 34 years ago. He has a 30 pack-year smoking history. He has never used smokeless tobacco. He reports that he does not drink alcohol and does not use drugs.  Allergies[1]  Family History  Problem Relation Age of Onset   Heart disease Father        cad, chf   Cancer Father    Heart disease Brother 5       CABG   Cancer Brother 88       pancreatic cancer   Diabetes Brother    Diabetes Brother    Heart disease Brother 61       CABG   Diabetes Brother    Heart disease Brother 31       CABG   Cancer Brother 61       prostate cancer   Heart disease Mother        cad   Diabetes Mother    Stroke Mother      Prior to Admission medications  Medication Sig Start  Date End Date Taking? Authorizing Provider  albuterol  (PROVENTIL ) (2.5 MG/3ML) 0.083% nebulizer solution Inhale 3 mLs (2.5 mg total) into the lungs every 6 (six) hours as needed for wheezing. Home med. 09/04/24  Yes Awanda City, MD  albuterol  (VENTOLIN  HFA) [phone] Base) MCG/ACT inhaler Inhale 2 puffs into the lungs every 6 (six) hours as needed for wheezing or shortness of breath. 05/21/19  Yes Wieting, Richard, MD  ARIPiprazole  (ABILIFY ) 5 MG tablet Take 5 mg by mouth daily.   Yes [provider]  ASPIRIN  LOW DOSE 81 MG tablet TAKE 1 TABLET BY MOUTH EVERY DAY 01/25/24  Yes Dew, Selinda RAMAN, MD  atorvastatin  (LIPITOR) 20 MG tablet Take 1 tablet (20 mg total) by mouth daily at 6 PM. 10/24/17  Yes Claudene Rayfield HERO, MD  BREZTRI  AEROSPHERE 160-9-4.8 MCG/ACT AERO SMARTSIG:2 Inhalation Via Inhaler Twice Daily 05/11/21  Yes [provider]  clopidogrel  (PLAVIX ) 75 MG tablet Take 1 tablet (75 mg total) by mouth  daily. 05/20/24  Yes Brown, Fallon E, NP  diazepam  (VALIUM ) 5 MG tablet Take 5 mg by mouth every 12 (twelve) hours. 05/26/21  Yes [provider]  ipratropium (ATROVENT ) 0.03 % nasal spray Place 1 spray into the nose 2 (two) times daily as needed for rhinitis. 11/28/23 11/27/24 Yes [provider]  JASCAYD  18 MG tablet Take 18 mg by mouth in the morning and at bedtime.   Yes [provider]  levothyroxine  (SYNTHROID ) 100 MCG tablet Take 100 mcg by mouth every morning.   Yes [provider]  metFORMIN  (GLUCOPHAGE ) 1000 MG tablet TAKE 1 TABLET BY MOUTH 2 TIMES DAILY WITH MEAL. Patient taking differently: Take 1,000 mg by mouth 2 (two) times daily with a meal. 03/16/18  Yes Claudene Rayfield HERO, MD  metoprolol  succinate (TOPROL -XL) 25 MG 24 hr tablet Take 12.5 mg by mouth daily. 04/22/21  Yes [provider]  mirtazapine  (REMERON ) 45 MG tablet Take 45 mg by mouth at bedtime. 04/04/23  Yes [provider]  morphine  (MS CONTIN ) 30 MG 12 hr tablet Take 30 mg by  mouth every 12 (twelve) hours. 02/05/21  Yes [provider]  pantoprazole  (PROTONIX ) 40 MG tablet Take 40 mg by mouth 2 (two) times daily before a meal. 01/20/23  Yes [provider]  predniSONE  (DELTASONE ) 10 MG tablet Take 40 mg (4 tabs) from 1/15 to 1/17, then 30 mg (3 tabs) from 1/18 to 1/20, then 20 mg (2 tabs) from 1/21 to 1/23, then back on your home 10 mg daily. 09/04/24  Yes Awanda City, MD  rOPINIRole  (REQUIP ) 0.5 MG tablet Take 0.5 mg by mouth at bedtime. 09/23/19 09/15/24 Yes [provider]  tamsulosin  (FLOMAX ) 0.4 MG CAPS capsule Take 2 capsules (0.8 mg total) by mouth daily after supper. 06/13/24  Yes Francisca Redell BROCKS, MD  venlafaxine  XR (EFFEXOR -XR) 150 MG 24 hr capsule Take 1 capsule (150 mg total) by mouth daily with breakfast. 01/24/18  Yes Smith, Kristi M, MD  Vitamin D , Ergocalciferol , (DRISDOL ) 1.25 MG (50000 UNIT) CAPS capsule Take 50,000 Units by mouth every 7 (seven) days. On Sundays 07/09/20  Yes [provider]  Accu-Chek FastClix Lancets MISC Apply topically daily. 03/09/21   [provider]  Blood Glucose Monitoring Suppl (ACCU-CHEK GUIDE) w/Device KIT See admin instructions. 05/22/19   [provider]  Docusate Sodium  100 MG capsule Take 100 mg by mouth in the morning, at noon, and at bedtime.    [provider]  Ensifentrine  (OHTUVAYRE ) 3 MG/2.5ML SUSP Inhale 1 Inhalation into the lungs in the morning and at bedtime. Patient not taking: Reported on 09/15/2024    [provider]  Ensure Max Protein (ENSURE MAX PROTEIN) LIQD Take 330 mLs (11 oz total) by mouth 2 (two) times daily. 05/21/19   Josette Ade, MD  glucose blood test strip See admin instructions. 06/18/21   [provider]  ipratropium-albuterol  (DUONEB) 0.5-2.5 (3) MG/3ML SOLN Inhale 3 mLs into the lungs 4 (four) times daily. Patient not taking: Reported on 09/15/2024 12/07/23 12/01/24  [provider]  naloxone  (NARCAN ) nasal spray  4 mg/0.1 mL Place into the nose.    [provider]    Physical Exam: Vitals:   09/15/24 0908 09/15/24 0910 09/15/24 0923  BP:   129/85  Pulse:  92 94  Resp:  (!) 25 18  Temp:  98.7 F (37.1 C)   TempSrc:  Oral   SpO2:   96%  Weight: 77.1 kg  Height: 5' 9 (1.753 m)      Constitutional: NAD, calm, comfortable Vitals:   09/15/24 0908 09/15/24 0910 09/15/24 0923  BP:   129/85  Pulse:  92 94  Resp:  (!) 25 18  Temp:  98.7 F (37.1 C)   TempSrc:  Oral   SpO2:   96%  Weight: 77.1 kg    Height: 5' 9 (1.753 m)     Eyes: PERRL, lids and conjunctivae normal ENMT: Mucous membranes are dry. Posterior pharynx clear of any exudate or lesions.Normal dentition.  Neck: normal, supple, no masses, no thyromegaly Respiratory: clear to auscultation bilaterally, no wheezing, no crackles. Normal respiratory effort. No accessory muscle use.  Cardiovascular: Regular rate and rhythm, no murmurs / rubs / gallops. No extremity edema. 2+ pedal pulses. No carotid bruits.  Abdomen: Tenderness on suprapubic area, no rebound or guarding, no masses palpated. No hepatosplenomegaly. Bowel sounds positive.  Musculoskeletal: no clubbing / cyanosis. No joint deformity upper and lower extremities. Good ROM, no contractures. Normal muscle tone.  Skin: no rashes, lesions, ulcers. No induration Neurologic: CN 2-12 grossly intact. Sensation intact, DTR normal. Strength 5/5 in all 4.  Psychiatric: Awake, oriented to himself, confused about time and place    Labs on Admission: I have personally reviewed following labs and imaging studies  CBC: Recent Labs  Lab 09/15/24 0856  WBC 17.3*  NEUTROABS 13.8*  HGB 11.7*  HCT 34.8*  MCV 90.6  PLT 249   Basic Metabolic Panel: Recent Labs  Lab 09/15/24 0856  NA 139  K 4.1  CL 97*  CO2 30  GLUCOSE 138*  BUN 29*  CREATININE 1.60*  CALCIUM  10.0  MG 1.3*   GFR: Estimated Creatinine Clearance: 45.4 mL/min (A) (by C-G formula based on SCr of 1.6  mg/dL (H)). Liver Function Tests: Recent Labs  Lab 09/15/24 0856  AST 20  ALT 15  ALKPHOS 73  BILITOT 0.3  PROT 7.1  ALBUMIN 4.0   Recent Labs  Lab 09/15/24 0856  LIPASE 12   No results for input(s): AMMONIA in the last 168 hours. Coagulation Profile: No results for input(s): INR, PROTIME in the last 168 hours. Cardiac Enzymes: No results for input(s): CKTOTAL, CKMB, CKMBINDEX, TROPONINI in the last 168 hours. BNP (last 3 results) Recent Labs    08/31/24 2240  PROBNP 278.0   HbA1C: No results for input(s): HGBA1C in the last 72 hours. CBG: No results for input(s): GLUCAP in the last 168 hours. Lipid Profile: No results for input(s): CHOL, HDL, LDLCALC, TRIG, CHOLHDL, LDLDIRECT in the last 72 hours. Thyroid  Function Tests: Recent Labs    09/15/24 0856  TSH 6.530*  FREET4 1.20   Anemia Panel: No results for input(s): VITAMINB12, FOLATE, FERRITIN, TIBC, IRON, RETICCTPCT in the last 72 hours. Urine analysis:    Component Value Date/Time   COLORURINE YELLOW (A) 09/15/2024 0956   APPEARANCEUR HAZY (A) 09/15/2024 0956   APPEARANCEUR Clear 03/02/2021 1456   LABSPEC 1.026 09/15/2024 0956   LABSPEC 1.006 11/20/2011 2054   PHURINE 5.0 09/15/2024 0956   GLUCOSEU 50 (A) 09/15/2024 0956   GLUCOSEU Negative 11/20/2011 2054   HGBUR MODERATE (A) 09/15/2024 0956   BILIRUBINUR NEGATIVE 09/15/2024 0956   BILIRUBINUR Negative 03/02/2021 1456   BILIRUBINUR Negative 11/20/2011 2054   KETONESUR NEGATIVE 09/15/2024 0956   PROTEINUR 30 (A) 09/15/2024 0956   UROBILINOGEN 1.0 03/01/2017 1249   UROBILINOGEN 1.0 03/13/2015 2215   NITRITE POSITIVE (A) 09/15/2024 0956   LEUKOCYTESUR NEGATIVE 09/15/2024 9043  LEUKOCYTESUR Negative 11/20/2011 2054    Radiological Exams on Admission: DG Skull 1-3 Views Result Date: 09/15/2024 EXAM: 1 or 2 or 3 VIEW(S) XRAY OF THE SKULL 09/15/2024 09:39:00 AM COMPARISON: CT head 09/15/2024 , skull series  05/14/2019. CLINICAL HISTORY: 69 year old male. Chronic cerebrospinal fluid (CSF) shunt, fall at home, evaluate for shunt malfunction. FINDINGS: BONES: No acute fracture. SINUSES: Unremarkable. SOFT TISSUES: Unremarkable. LINES AND TUBES: Unchanged radiographic appearance of left vertex approach ventriculostomy catheter, left convexity partially radiolucent shunt reservoir and tubing. IMPRESSION: 1. Stable appearance of left vertex ventriculostomy catheter, shunt reservoir, and tubing. Electronically signed by: Helayne Hurst MD 09/15/2024 09:53 AM EST RP Workstation: [id number]   DG Chest 1 View Result Date: 09/15/2024 EXAM: 1 VIEW(S) XRAY OF THE CHEST 09/15/2024 09:39:00 AM COMPARISON: 08/31/2024 CLINICAL HISTORY: 69 year old male. Evaluate for shunt malfunction. Chronic cerebrospinal fluid (CSF) shunt. Fall at home. FINDINGS: LINES, TUBES AND DEVICES: Shunt tubing over left chest. Stable appearance of left chest shunt tubing which is partially calcified. No kinking or discontinuity identified. LUNGS AND PLEURA: Stable chronic peripheral and basilar predominant interstitial opacities. No consolidation or pleural effusion. No pneumothorax. HEART AND MEDIASTINUM: Aortic atherosclerosis. No acute abnormality of the cardiac and mediastinal silhouettes. BONES AND SOFT TISSUES: No acute osseous abnormality. IMPRESSION: 1. Stable left chest shunt tubing; no kinking or discontinuity. 2. Chronic interstitial lung disease appears stable. 3. Aortic Atherosclerosis (ICD10-I70.0). Electronically signed by: Helayne Hurst MD 09/15/2024 09:51 AM EST RP Workstation: [id number]   DG Abd 1 View Result Date: 09/15/2024 EXAM: 1 VIEW XRAY OF THE ABDOMEN 09/15/2024 09:39:00 AM COMPARISON: 04/27/2024 CLINICAL HISTORY: 69 year old male with chronic CSF shunt, fall at home. FINDINGS: LINES, TUBES AND DEVICES: Shunt catheter tubing in left abdomen. Left iliac artery stent noted. BOWEL: Nonobstructed bowel gas pattern. Moderate colonic  stool burden. SOFT TISSUES: Calcified atherosclerosis. No abnormal calcifications. BONES: No acute fracture. IMPRESSION: 1. Shunt catheter terminates left central abdomen without visible kinking or discontinuity. 2. Non-obstructed bowel gas pattern with moderate colonic stool burden. Electronically signed by: Helayne Hurst MD 09/15/2024 09:50 AM EST RP Workstation: [id number]   DG Cervical Spine 1 View Result Date: 09/15/2024 EXAM: 1 AP VIEW XRAY OF THE CERVICAL SPINE 09/15/2024 09:39:00 AM COMPARISON: Neck radiographs 03/13/2015. CLINICAL HISTORY: 69 year old male with altered behavior, chronic cerebrospinal fluid (CSF) shunt, and history of fall at home. FINDINGS: BONES: Vertebral body heights are maintained. Alignment is normal. No acute osseous abnormality. DISCS AND DEGENERATIVE CHANGES: Chronic cervical spine degeneration. SOFT TISSUES: Left neck and upper chest shunt catheter is partially calcified, with a slightly more irregular course, but no kinking or discontinuity identified. No prevertebral soft tissue swelling. Chronic apical lung interstitial changes are advanced but stable. IMPRESSION: 1. Left shunt catheter partially calcified but without visible kinking or discontinuity. 2. Chronic interstitial lung disease. Electronically signed by: Helayne Hurst MD 09/15/2024 09:46 AM EST RP Workstation: [id number]   CT Cervical Spine Wo Contrast Result Date: 09/15/2024 EXAM: CT CERVICAL SPINE WITHOUT CONTRAST 09/15/2024 09:16:00 AM TECHNIQUE: CT of the cervical spine was performed without the administration of intravenous contrast. Multiplanar reformatted images are provided for review. Automated exposure control, iterative reconstruction, and/or weight based adjustment of the mA/kV was utilized to reduce the radiation dose to as low as reasonably achievable. COMPARISON: CT head reported separately today. CT cervical spine 12/11/2023. CLINICAL HISTORY: 69 year old male with lethargy for 2-3 days, decreased  urination, unobserved fall at home, and pain. FINDINGS: BONES AND ALIGNMENT: Chronic reversal of lower cervical lordosis is stable. Chronic mild dextroconvex cervical  scoliosis. No acute osseous abnormality. Mild motion artifact. DEGENERATIVE CHANGES: Evidence of developing degenerative interbody ankylosis at both C6-C7 and C7-T1. Superimposed chronic degenerative facet ankylosis on the left at C5-C6. Widespread additional left side facet arthropathy. Chronic severe anterior C1-C2 degeneration. SOFT TISSUES: Stable visible non-contrast neck soft tissues including left side subcutaneous CSF shunt. Calcified aortic arch atherosclerosis and chronic apical lung disease visible at the thoracic inlet. Suggestion of combined chronic emphysema and pulmonary fibrosis. No prevertebral soft tissue swelling. IMPRESSION: 1. No acute traumatic injury identified in the cervical spine. 2. Chronic cervical spine degeneration and ankylosis appears stable from last year. 3. Chronic apical lung disease. Electronically signed by: Helayne Hurst MD 09/15/2024 09:32 AM EST RP Workstation: [id number]   CT Head Wo Contrast Result Date: 09/15/2024 EXAM: CT HEAD WITHOUT CONTRAST 09/15/2024 09:16:00 AM TECHNIQUE: CT of the head was performed without the administration of intravenous contrast. Automated exposure control, iterative reconstruction, and/or weight based adjustment of the mA/kV was utilized to reduce the radiation dose to as low as reasonably achievable. COMPARISON: Brain MRI 05/14/2019, Head CT 12/11/2023. CLINICAL HISTORY: 69 year old male. Lethargy for 2 to 3 days, decreased urination, unobserved fall at home, pain. FINDINGS: BRAIN AND VENTRICLES: No acute hemorrhage. No evidence of acute infarct. Decompressed and slit-like ventricles in the setting of chronic CSF shunt, not significantly changed from previous exams. Intracranial shunt position stable, crossing midline and terminating at the right caudothalamic groove area. Seen  confluent stable chronic nonspecific cerebral white matter hypodensity most pronounced in the right superior frontal gyrus (coronal image 27). Stable gray-white differentiation. Background brain volume is normal for age. No extra-axial collection. No mass effect or midline shift. No suspicious intracranial vascular hyperdensity. Calcified atherosclerosis at the skull base. ORBITS: No acute abnormality. SINUSES: Paranasal sinuses, tympanic cavities, and mastoids remain well aerated. SOFT TISSUES AND SKULL: Chronic left vertex approach CSF shunt. Stable scalp shunt reservoir and tubing. Stable left vertex skull burr hole. Partially visible advanced upper cervical spine degeneration. No acute soft tissue abnormality. No skull fracture. IMPRESSION: 1. No acute intracranial abnormality or acute traumatic injury identified. 2. Stable chronic CSF shunt and slit-like ventricles. 3. Stable chronic nonspecific white matter disease. Electronically signed by: Helayne Hurst MD 09/15/2024 09:28 AM EST RP Workstation: [id number]    EKG: Independently reviewed.  Sinus rhythm, no acute ST changes  Assessment/Plan Principal Problem:   AMS (altered mental status) Active Problems:   Fall at home, initial encounter  (please populate well all problems here in Problem List. (For example, if patient is on BP meds at home and you resume or decide to hold them, it is a problem that needs to be her. Same for CAD, COPD, HLD and so on)  Acute metabolic encephalopathy Questionable early sepsis - Sepsis as evidenced by leukocytosis, elevated lactic acid, source of infection clinically suspect there is a UTI. - IV fluid - Ceftriaxone  - Urine culture sent - Check PVR - Other DDx, recent concern about polypharmacy as patient does take morphine  twice daily and Valium .  Wife reported patient has been taking that regimen for years and unlikely to cause acute mentation changes.  Given that patient mentation, will cut down his morphine   and Valium  dosage for now.  Acute urinary retention - Suspected UTI, management as above.  Hypomagnesemia - IV replacement - Recheck level tomorrow  Acute urinary retention - Again suspect UTI - Continue Flomax  - Check PVR  COPD Pulmonary fibrosis Chronic hypoxic and hypercapnic respiratory failure - VBG showed compensated pH -  Continue current breathing treatment  RA - Stable, continue steroid 80 mg daily and 130 Cimzia injection - Cut down morphine  doses  Pulmonary fibrosis - Continue Jascayd   Anxiety/depression - Continue SSRI, cut down Valium  doses  IIDM - Hold off metformin  while patient has elevated lactic acid level  CKD stage IIIa - Volume contracted, on IV fluid - Creatinine level stable  History of hydrocephalus - VP shunt stable on image study  PAF - In sinus rhythm - Not on systemic anticoagulation  Deconditioning - PT evaluation  DVT prophylaxis: Lovenox  Code Status: Full code Family Communication: Wife over the phone disposition Plan: Expect less than 2 midnight hospital stay Consults called: None Admission status: Telemetry observation   Cort ONEIDA Mana MD [facility] Pager [phone]  09/15/2024, 11:54 AM       [1]  Allergies Allergen Reactions   Advair Diskus [Fluticasone-Salmeterol] Shortness Of Breath   Erythromycin Nausea And Vomiting   Iodinated Contrast Media Shortness Of Breath and Other (See Comments)   Ativan  [Lorazepam ] Other (See Comments)    Caused pt's heart to stop   Cephalexin Other (See Comments)    Heart Burn   Darvocet [Propoxyphene N-Acetaminophen ] Nausea And Vomiting   Doxycycline Hyclate Nausea And Vomiting, Swelling and Other (See Comments)    Tongue swelling, severe depression   Hydroxychloroquine Other (See Comments)    Restless leg   Propoxyphene Nausea And Vomiting   Septra [Sulfamethoxazole-Trimethoprim] Rash   "

## 2024-09-15 NOTE — ED Notes (Addendum)
 This RN reported Lactic 2.4 to assigned MD

## 2024-09-16 DIAGNOSIS — A419 Sepsis, unspecified organism: Secondary | ICD-10-CM

## 2024-09-16 DIAGNOSIS — W19XXXA Unspecified fall, initial encounter: Secondary | ICD-10-CM

## 2024-09-16 DIAGNOSIS — G9340 Encephalopathy, unspecified: Secondary | ICD-10-CM

## 2024-09-16 DIAGNOSIS — N390 Urinary tract infection, site not specified: Secondary | ICD-10-CM

## 2024-09-16 DIAGNOSIS — N189 Chronic kidney disease, unspecified: Secondary | ICD-10-CM | POA: Diagnosis not present

## 2024-09-16 DIAGNOSIS — N179 Acute kidney failure, unspecified: Secondary | ICD-10-CM

## 2024-09-16 LAB — GLUCOSE, CAPILLARY
Glucose-Capillary: 107 mg/dL — ABNORMAL HIGH (ref 70–99)
Glucose-Capillary: 172 mg/dL — ABNORMAL HIGH (ref 70–99)
Glucose-Capillary: 180 mg/dL — ABNORMAL HIGH (ref 70–99)
Glucose-Capillary: 126 mg/dL — ABNORMAL HIGH (ref 70–99)

## 2024-09-16 LAB — HEMOGLOBIN A1C
Hgb A1c MFr Bld: 6.6 % — ABNORMAL HIGH (ref 4.8–5.6)
Mean Plasma Glucose: 142.72 mg/dL

## 2024-09-16 LAB — CBC
HCT: 31.2 % — ABNORMAL LOW (ref 39.0–52.0)
Hemoglobin: 10.1 g/dL — ABNORMAL LOW (ref 13.0–17.0)
MCH: 30.2 pg (ref 26.0–34.0)
MCHC: 32.4 g/dL (ref 30.0–36.0)
MCV: 93.4 fL (ref 80.0–100.0)
Platelets: 217 10*3/uL (ref 150–400)
RBC: 3.34 MIL/uL — ABNORMAL LOW (ref 4.22–5.81)
RDW: 14.3 % (ref 11.5–15.5)
WBC: 7.6 10*3/uL (ref 4.0–10.5)
nRBC: 0 % (ref 0.0–0.2)

## 2024-09-16 LAB — BASIC METABOLIC PANEL WITH GFR
Anion gap: 9 (ref 5–15)
BUN: 20 mg/dL (ref 8–23)
CO2: 29 mmol/L (ref 22–32)
Calcium: 8.7 mg/dL — ABNORMAL LOW (ref 8.9–10.3)
Chloride: 100 mmol/L (ref 98–111)
Creatinine, Ser: 1.19 mg/dL (ref 0.61–1.24)
GFR, Estimated: >60 mL/min
Glucose, Bld: 121 mg/dL — ABNORMAL HIGH (ref 70–99)
Potassium: 3.8 mmol/L (ref 3.5–5.1)
Sodium: 137 mmol/L (ref 135–145)

## 2024-09-16 MED ORDER — ENSIFENTRINE 3 MG/2.5ML IN SUSP
3.0000 mg | Freq: Two times a day (BID) | RESPIRATORY_TRACT | Status: DC
  Administered 2024-09-16: 3 mg via RESPIRATORY_TRACT
  Filled 2024-09-16 (×2): qty 1

## 2024-09-16 MED ORDER — ENSURE PLUS HIGH PROTEIN PO LIQD
237.0000 mL | Freq: Two times a day (BID) | ORAL | Status: DC
  Administered 2024-09-16: 237 mL via ORAL

## 2024-09-16 MED ORDER — MAGNESIUM SULFATE 2 GM/50ML IV SOLN
2.0000 g | Freq: Once | INTRAVENOUS | Status: AC
  Administered 2024-09-16: 2 g via INTRAVENOUS
  Filled 2024-09-16: qty 50

## 2024-09-16 MED ORDER — FLEET ENEMA RE ENEM
1.0000 | ENEMA | Freq: Once | RECTAL | Status: AC
  Administered 2024-09-16: 1 via RECTAL

## 2024-09-16 MED ORDER — NERANDOMILAST 18 MG PO TABS
18.0000 mg | ORAL_TABLET | Freq: Two times a day (BID) | ORAL | Status: DC
  Administered 2024-09-16 – 2024-09-17 (×2): 18 mg via ORAL
  Filled 2024-09-16 (×2): qty 1

## 2024-09-16 NOTE — Progress Notes (Signed)
 [facility]  - [place] [facility]   PATIENT NAME: Kirk Robinson    MR#:  [id number]  DATE OF BIRTH:  22-Sep-1955  SUBJECTIVE:  wife at bedside. Patient more awake alert answered patients appropriately. Tolerating diet came in with poor PO fluid intake. Found down on the bathroom floor. No major trauma. Work with PT earlier today walked to the door. Uses oxygen chronically    VITALS:  Blood pressure (!) 88/74, pulse 72, temperature 98.2 F (36.8 C), temperature source Oral, resp. rate 18, height 5' 9 (1.753 m), weight 77.1 kg, SpO2 100%.  PHYSICAL EXAMINATION:   GENERAL:  69 y.o.-year-old patient with no acute distress. Chronically ill LUNGS: decreased breath sounds bilaterally, no wheezing CARDIOVASCULAR: S1, S2 normal. No murmur   ABDOMEN: Soft, nontender, nondistended.  EXTREMITIES+ edema b/l.    NEUROLOGIC: nonfocal  patient is alert and awake SKIN: per RN  LABORATORY PANEL:  CBC Recent Labs  Lab 09/16/24 0529  WBC 7.6  HGB 10.1*  HCT 31.2*  PLT 217    Chemistries  Recent Labs  Lab 09/15/24 0856 09/16/24 0529  NA 139 137  K 4.1 3.8  CL 97* 100  CO2 30 29  GLUCOSE 138* 121*  BUN 29* 20  CREATININE 1.60* 1.19  CALCIUM  10.0 8.7*  MG 1.3*  --   AST 20  --   ALT 15  --   ALKPHOS 73  --   BILITOT 0.3  --    Cardiac Enzymes No results for input(s): TROPONINI in the last 168 hours. RADIOLOGY:  DG Skull 1-3 Views Result Date: 09/15/2024 EXAM: 1 or 2 or 3 VIEW(S) XRAY OF THE SKULL 09/15/2024 09:39:00 AM COMPARISON: CT head 09/15/2024 , skull series 05/14/2019. CLINICAL HISTORY: 69 year old male. Chronic cerebrospinal fluid (CSF) shunt, fall at home, evaluate for shunt malfunction. FINDINGS: BONES: No acute fracture. SINUSES: Unremarkable. SOFT TISSUES: Unremarkable. LINES AND TUBES: Unchanged radiographic appearance of left vertex approach ventriculostomy catheter, left convexity partially radiolucent shunt reservoir and tubing. IMPRESSION:  1. Stable appearance of left vertex ventriculostomy catheter, shunt reservoir, and tubing. Electronically signed by: Helayne Hurst MD 09/15/2024 09:53 AM EST RP Workstation: [id number]   DG Chest 1 View Result Date: 09/15/2024 EXAM: 1 VIEW(S) XRAY OF THE CHEST 09/15/2024 09:39:00 AM COMPARISON: 08/31/2024 CLINICAL HISTORY: 69 year old male. Evaluate for shunt malfunction. Chronic cerebrospinal fluid (CSF) shunt. Fall at home. FINDINGS: LINES, TUBES AND DEVICES: Shunt tubing over left chest. Stable appearance of left chest shunt tubing which is partially calcified. No kinking or discontinuity identified. LUNGS AND PLEURA: Stable chronic peripheral and basilar predominant interstitial opacities. No consolidation or pleural effusion. No pneumothorax. HEART AND MEDIASTINUM: Aortic atherosclerosis. No acute abnormality of the cardiac and mediastinal silhouettes. BONES AND SOFT TISSUES: No acute osseous abnormality. IMPRESSION: 1. Stable left chest shunt tubing; no kinking or discontinuity. 2. Chronic interstitial lung disease appears stable. 3. Aortic Atherosclerosis (ICD10-I70.0). Electronically signed by: Helayne Hurst MD 09/15/2024 09:51 AM EST RP Workstation: [id number]   DG Abd 1 View Result Date: 09/15/2024 EXAM: 1 VIEW XRAY OF THE ABDOMEN 09/15/2024 09:39:00 AM COMPARISON: 04/27/2024 CLINICAL HISTORY: 69 year old male with chronic CSF shunt, fall at home. FINDINGS: LINES, TUBES AND DEVICES: Shunt catheter tubing in left abdomen. Left iliac artery stent noted. BOWEL: Nonobstructed bowel gas pattern. Moderate colonic stool burden. SOFT TISSUES: Calcified atherosclerosis. No abnormal calcifications. BONES: No acute fracture. IMPRESSION: 1. Shunt catheter terminates left central abdomen without visible kinking or discontinuity. 2. Non-obstructed bowel gas pattern with moderate colonic stool burden.  Electronically signed by: Helayne Hurst MD 09/15/2024 09:50 AM EST RP Workstation: [id number]   DG Cervical Spine 1  View Result Date: 09/15/2024 EXAM: 1 AP VIEW XRAY OF THE CERVICAL SPINE 09/15/2024 09:39:00 AM COMPARISON: Neck radiographs 03/13/2015. CLINICAL HISTORY: 69 year old male with altered behavior, chronic cerebrospinal fluid (CSF) shunt, and history of fall at home. FINDINGS: BONES: Vertebral body heights are maintained. Alignment is normal. No acute osseous abnormality. DISCS AND DEGENERATIVE CHANGES: Chronic cervical spine degeneration. SOFT TISSUES: Left neck and upper chest shunt catheter is partially calcified, with a slightly more irregular course, but no kinking or discontinuity identified. No prevertebral soft tissue swelling. Chronic apical lung interstitial changes are advanced but stable. IMPRESSION: 1. Left shunt catheter partially calcified but without visible kinking or discontinuity. 2. Chronic interstitial lung disease. Electronically signed by: Helayne Hurst MD 09/15/2024 09:46 AM EST RP Workstation: [id number]   CT Cervical Spine Wo Contrast Result Date: 09/15/2024 EXAM: CT CERVICAL SPINE WITHOUT CONTRAST 09/15/2024 09:16:00 AM TECHNIQUE: CT of the cervical spine was performed without the administration of intravenous contrast. Multiplanar reformatted images are provided for review. Automated exposure control, iterative reconstruction, and/or weight based adjustment of the mA/kV was utilized to reduce the radiation dose to as low as reasonably achievable. COMPARISON: CT head reported separately today. CT cervical spine 12/11/2023. CLINICAL HISTORY: 69 year old male with lethargy for 2-3 days, decreased urination, unobserved fall at home, and pain. FINDINGS: BONES AND ALIGNMENT: Chronic reversal of lower cervical lordosis is stable. Chronic mild dextroconvex cervical scoliosis. No acute osseous abnormality. Mild motion artifact. DEGENERATIVE CHANGES: Evidence of developing degenerative interbody ankylosis at both C6-C7 and C7-T1. Superimposed chronic degenerative facet ankylosis on the left at C5-C6.  Widespread additional left side facet arthropathy. Chronic severe anterior C1-C2 degeneration. SOFT TISSUES: Stable visible non-contrast neck soft tissues including left side subcutaneous CSF shunt. Calcified aortic arch atherosclerosis and chronic apical lung disease visible at the thoracic inlet. Suggestion of combined chronic emphysema and pulmonary fibrosis. No prevertebral soft tissue swelling. IMPRESSION: 1. No acute traumatic injury identified in the cervical spine. 2. Chronic cervical spine degeneration and ankylosis appears stable from last year. 3. Chronic apical lung disease. Electronically signed by: Helayne Hurst MD 09/15/2024 09:32 AM EST RP Workstation: [id number]   CT Head Wo Contrast Result Date: 09/15/2024 EXAM: CT HEAD WITHOUT CONTRAST 09/15/2024 09:16:00 AM TECHNIQUE: CT of the head was performed without the administration of intravenous contrast. Automated exposure control, iterative reconstruction, and/or weight based adjustment of the mA/kV was utilized to reduce the radiation dose to as low as reasonably achievable. COMPARISON: Brain MRI 05/14/2019, Head CT 12/11/2023. CLINICAL HISTORY: 69 year old male. Lethargy for 2 to 3 days, decreased urination, unobserved fall at home, pain. FINDINGS: BRAIN AND VENTRICLES: No acute hemorrhage. No evidence of acute infarct. Decompressed and slit-like ventricles in the setting of chronic CSF shunt, not significantly changed from previous exams. Intracranial shunt position stable, crossing midline and terminating at the right caudothalamic groove area. Seen confluent stable chronic nonspecific cerebral white matter hypodensity most pronounced in the right superior frontal gyrus (coronal image 27). Stable gray-white differentiation. Background brain volume is normal for age. No extra-axial collection. No mass effect or midline shift. No suspicious intracranial vascular hyperdensity. Calcified atherosclerosis at the skull base. ORBITS: No acute abnormality.  SINUSES: Paranasal sinuses, tympanic cavities, and mastoids remain well aerated. SOFT TISSUES AND SKULL: Chronic left vertex approach CSF shunt. Stable scalp shunt reservoir and tubing. Stable left vertex skull burr hole. Partially visible advanced upper cervical spine degeneration. No acute  soft tissue abnormality. No skull fracture. IMPRESSION: 1. No acute intracranial abnormality or acute traumatic injury identified. 2. Stable chronic CSF shunt and slit-like ventricles. 3. Stable chronic nonspecific white matter disease. Electronically signed by: Helayne Hurst MD 09/15/2024 09:28 AM EST RP Workstation: [id number]    Assessment and Plan  Kirk Robinson is a 69 y.o. 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   Wife reported and patient started to appear lethargic since yesterday and has been eating and drinking very little and she wondering whether patient was dehydrated. And this morning, patient went to bathroom and fell down on the floor.   Acute metabolic encephalopathy--improving Sepsis as evidenced by leukocytosis, elevated lactic acid, source of infection clinically suspect there is a UTI. - received IV fluid - Ceftriaxone  - Urine culture pending   Acute urinary retention - Suspected UTI, management as above. --cont flomax    Hypomagnesemia - IV replacement - Recheck level tomorrow    COPD Chronic Chronic Pulmonary fibrosis Chronic hypoxic and hypercapnic respiratory failure/Chronic home oxygen - VBG showed compensated pH -- Continue current breathing treatment -- Continue Jascayd  --follows with Dr theotis   RA -  Stable, continue steroid daily and 130 Cimzia injection  Anxiety/depression - Continue SSRI,Valium   IIDM - Hold off metformin  while patient has elevated lactic acid level   Acute on CKD stage IIIa - Volume contracted, on IV fluid - Creatinine level stable   History of hydrocephalus - VP shunt stable on image study   PAF - In sinus rhythm - Not on anticoagulation,   Deconditioning - PT evaluation--HHPT   DVT prophylaxis: Lovenox  Code Status: Full code Family Communication: Wife at bedside  disposition Plan: home with [facility] Consults called: None Level of care: Telemetry Status is: Inpatient Remains inpatient appropriate because: cont IVF, follow UC    TOTAL TIME TAKING CARE OF THIS PATIENT: 40 minutes.  >50% time spent on counselling and coordination of care  Note: This dictation was prepared with Dragon dictation along with smaller phrase technology. Any transcriptional errors that result from this process are unintentional.  Leita Blanch M.D    [facility]   CC: Primary care physician; Claudene Rayfield HERO, MD

## 2024-09-16 NOTE — Plan of Care (Signed)
  Problem: Clinical Measurements: Goal: Will remain free from infection Outcome: Progressing Goal: Respiratory complications will improve Outcome: Progressing   Problem: Activity: Goal: Risk for activity intolerance will decrease Outcome: Progressing   Problem: Nutrition: Goal: Adequate nutrition will be maintained Outcome: Progressing   

## 2024-09-16 NOTE — Evaluation (Signed)
 Physical Therapy Evaluation Patient Details Name: Kirk Robinson MRN: [id number] DOB: 02/24/1956 Today's Date: 09/16/2024  History of Present Illness  Pt is a 69 y.o. 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   Clinical Impression  Patient alert, agreeable to PT with encouragement, oriented to self, place, situation. Pt reported at baseline he is ambulatory in home with rollator, lives with family and normally able to perform ADLs (some assistance needed with showering).   Orthostatic vitals assessed, negative. Bed mobility with CGA, sit <> stand with RW and CGA. He was able to ambulate ~58ft with RW and CGA, but exhibited fatigue, and SOB. spO2 to 70s-80s on 3L, placed on 4L for recovery, and at end of session returned to 3L. RN notified of pt status and also that he reported at baseline he uses 4L per his report.  Overall the patient demonstrated deficits (see PT Problem List) that impede the patient's functional abilities, safety, and mobility and would benefit from skilled PT intervention.          If plan is discharge home, recommend the following: A little help with walking and/or transfers;A little help with bathing/dressing/bathroom;Assistance with cooking/housework;Direct supervision/assist for medications management;Direct supervision/assist for financial management;Assist for transportation;Help with stairs or ramp for entrance;Supervision due to cognitive status   Can travel by private vehicle   Yes    Equipment Recommendations None recommended by PT  Recommendations for Other Services       Functional Status  Assessment Patient has had a recent decline in their functional status and demonstrates the ability to make significant improvements in function in a reasonable and predictable amount of time.     Precautions / Restrictions Precautions Precautions: Fall Recall of Precautions/Restrictions: Intact Precaution/Restrictions Comments: watch O2 Restrictions Weight Bearing Restrictions Per Provider Order: No      Mobility  Bed Mobility Overal bed mobility: Needs Assistance Bed Mobility: Supine to Sit                Transfers Overall transfer level: Needs assistance Equipment used: Rolling walker (2 wheels) Transfers: Sit to/from Stand Sit to Stand: Contact guard assist                Ambulation/Gait Ambulation/Gait assistance: Contact guard assist Gait Distance (Feet): 50 Feet Assistive device: Rolling walker (2 wheels) Gait Pattern/deviations: Step-through pattern Gait velocity: decreased     General Gait Details: spO2 on 3L, turned up to 4L due to desaturation  Stairs            Wheelchair Mobility     Tilt Bed    Modified Rankin (Stroke Patients Only)       Balance Overall balance assessment: Needs assistance Sitting-balance support: Feet supported, Bilateral upper extremity supported Sitting balance-Leahy Scale: Good     Standing balance support: Bilateral upper extremity supported, During functional activity, Reliant on assistive device for balance Standing balance-Leahy Scale: Fair                               Pertinent Vitals/Pain Pain Assessment Pain Assessment: 0-10 Pain Score: 8  Pain Location: L flank/rib pain Pain Descriptors / Indicators: Discomfort, Grimacing, Guarding  Pain Intervention(s): Limited activity within patient's tolerance, Monitored during session, Repositioned    Home Living Family/patient expects to be discharged to:: Private residence Living Arrangements: Spouse/significant other;Children;Other  relatives Available Help at Discharge: Family Type of Home: Other(Comment) (townhome) Home Access: Level entry       Home Layout: Two level;Able to live on main level with bedroom/bathroom Home Equipment: Grab bars - tub/shower;Grab bars - toilet;Shower Counsellor (2 wheels);Cane - single point;Rollator (4 wheels)      Prior Function Prior Level of Function : Needs assist             Mobility Comments: limited household AMB with 4L O2 and 4WW or SPC ADLs Comments: wife assists with ADLs as needed     Extremity/Trunk Assessment   Upper Extremity Assessment Upper Extremity Assessment: Generalized weakness    Lower Extremity Assessment Lower Extremity Assessment: Generalized weakness       Communication        Cognition Arousal: Alert Behavior During Therapy: WFL for tasks assessed/performed                           PT - Cognition Comments: delayed processing Following commands: Intact       Cueing       General Comments      Exercises     Assessment/Plan    PT Assessment Patient needs continued PT services  PT Problem List Decreased strength;Decreased activity tolerance;Decreased balance;Decreased mobility;Decreased coordination;Decreased knowledge of use of DME;Decreased safety awareness;Cardiopulmonary status limiting activity;Decreased knowledge of precautions;Pain       PT Treatment Interventions DME instruction;Gait training;Therapeutic activities;Functional mobility training;Balance training;Therapeutic exercise;Neuromuscular re-education;Patient/family education    PT Goals (Current goals can be found in the Care Plan section)  Acute Rehab PT Goals Patient Stated Goal: to go home PT Goal Formulation: With patient Time For Goal Achievement: 09/30/24 Potential to Achieve Goals: Good    Frequency Min 2X/week     Co-evaluation               AM-PAC PT 6 Clicks Mobility  Outcome Measure Help needed turning from  your back to your side while in a flat bed without using bedrails?: A Little Help needed moving from lying on your back to sitting on the side of a flat bed without using bedrails?: A Little Help needed moving to and from a bed to a chair (including a wheelchair)?: A Little Help needed standing up from a chair using your arms (e.g., wheelchair or bedside chair)?: A Little Help needed to walk in hospital room?: A Little Help needed climbing 3-5 steps with a railing? : A Lot 6 Click Score: 17    End of Session Equipment Utilized During Treatment: Oxygen (3-4L) Activity Tolerance: Patient limited by fatigue Patient left: in bed;with call bell/phone within reach Nurse Communication: Mobility status PT Visit Diagnosis: Unsteadiness on feet (R26.81);Muscle weakness (generalized) (M62.81);Other abnormalities of gait and mobility (R26.89)    Time: 0940-1007 PT Time Calculation (min) (ACUTE ONLY): 27 min   Charges:   PT Evaluation $PT Eval Low Complexity: 1 Low PT Treatments $Therapeutic Activity: 8-22 mins PT General Charges $$ ACUTE PT VISIT: 1 Visit         Doyal Shams PT, DPT 1:18 PM,09/16/24

## 2024-09-16 NOTE — TOC Initial Note (Signed)
 Transition of Care [facility]) - Initial/Assessment Note    Patient Details  Name: Kirk Robinson MRN: [id number] Date of Birth: 1956-04-13  Transition of Care [facility]) CM/SW Contact:    Nathanael CHRISTELLA Ring, RN Phone Number: 09/16/2024, 9:36 AM  Clinical Narrative:                 Patient recently discharged from [facility], from home with wife and daughter, set up with [facility] through [facility] last admission, has DME and new DME was ordered and delivered last admission.  Chronic oxygen through [employer] at home. Wife provides transportation.  TOC will follow for additional needs.   Expected Discharge Plan: Home w Home Health Services Barriers to Discharge: Continued Medical Work up   Patient Goals and CMS Choice   CMS Medicare.gov Compare Post Acute Care list provided to:: Patient Choice offered to / list presented to : Patient      Expected Discharge Plan and Services     Post Acute Care Choice: Home Health Living arrangements for the past 2 months: Single Family Home                           HH Arranged: RN, PT, OT [facility] Agency: [facility] Date [facility] Agency Contacted: 09/16/24 Time HH Agency Contacted: [phone]    Prior Living Arrangements/Services Living arrangements for the past 2 months: Single Family Home Lives with:: Adult Children, Spouse Patient language and need for interpreter reviewed:: Yes        Need for Family Participation in Patient Care: Yes (Comment) Care giver support system in place?: Yes (comment) Current home services: DME Criminal Activity/Legal Involvement Pertinent to Current Situation/Hospitalization: No - Comment as needed  Activities of Daily Living   ADL Screening (condition at time of admission) Independently performs ADLs?: Yes (appropriate for developmental age) Is the patient deaf or have difficulty hearing?: No Does the patient have difficulty seeing, even when wearing glasses/contacts?: No Does the patient have difficulty concentrating, remembering, or  making decisions?: No  Permission Sought/Granted                  Emotional Assessment         Alcohol / Substance Use: Not Applicable Psych Involvement: No (comment)  Admission diagnosis:  Acute encephalopathy [G93.40] Fall, initial encounter [W19.XXXA] AMS (altered mental status) [R41.82] Sepsis, due to unspecified organism, unspecified whether acute organ dysfunction present [facility]) [A41.9] Patient Active Problem List   Diagnosis Date Noted   AMS (altered mental status) 09/15/2024   Acute on chronic respiratory failure with hypoxia (HCC) 08/31/2024   Chronic diastolic CHF (congestive heart failure) (HCC) 08/31/2024   Hypothyroidism 08/31/2024   Rheumatoid arthritis (HCC) 08/31/2024   Myocardial injury 08/31/2024   Shortness of breath    SVT (supraventricular tachycardia) 12/11/2023   Syncope 12/11/2023   Fall at home, initial encounter 12/11/2023   Type II diabetes mellitus with renal manifestations (HCC) 12/11/2023   Acute renal failure superimposed on stage 3a chronic kidney disease (HCC) 12/11/2023   Stroke (HCC) 12/11/2023   Hypokalemia 12/11/2023   Acute urinary retention 12/11/2023   Elevated lactic acid level 12/11/2023   Leukocytosis 12/11/2023   Rhinovirus infection 12/11/2023   Hypomagnesemia 12/11/2023   Colon cancer screening    Adenomatous polyp of colon    Chronic respiratory failure with hypoxia (HCC) 12/18/2020   Psychophysiological insomnia 12/13/2020   Atherosclerosis of native arteries of extremity with intermittent claudication 06/02/2020   Restless leg syndrome  12/26/2019   Centrilobular emphysema (HCC) 12/26/2019   Type 2 diabetes mellitus with hyperosmolar nonketotic hyperglycemia (HCC) 05/11/2019   Type 2 diabetes mellitus without complication, without long-term current use of insulin  (HCC) 03/19/2017   Anxiety and depression 03/19/2017   Amnesia 03/19/2017   Malnutrition of moderate degree 03/19/2015   Atrial fibrillation, chronic (HCC)     Hydrocephalus in adult (HCC) 05/18/2014   Obstructed VP shunt 05/11/2014   Chronic pain syndrome 05/11/2014   Pulmonary fibrosis (HCC) 01/14/2013   GERD (gastroesophageal reflux disease) 01/14/2013   Pure hypercholesterolemia 01/14/2013   Hyperlipidemia 05/30/2012   Depression 05/30/2012   COPD exacerbation (HCC) 05/30/2012   Degenerative disc disease 05/30/2012   Hypothyroidism following radioiodine therapy 05/30/2012   Osteoporosis 05/30/2012   Peripheral vascular disease 05/30/2012   Tobacco abuse 05/30/2012   Adult hypothyroidism 01/07/2010   History of colon polyps 09/30/2008   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   PCP:  Claudene Rayfield HERO, MD Pharmacy:   [facility] [phone] - [place], [address] - [address] [place] [address] Phone: [phone] Fax: [phone]     Social Drivers of Health (SDOH) Social History: SDOH Screenings   Food Insecurity: No Food Insecurity (09/15/2024)  Housing: Unknown (09/15/2024)  Transportation Needs: No Transportation Needs (09/15/2024)  Utilities: Not At Risk (09/15/2024)  Depression (PHQ2-9): Medium Risk (01/19/2022)  Financial Resource Strain: Low Risk  (03/05/2024)   Received from [facility]  Social Connections: Socially Isolated (09/15/2024)  Tobacco Use: Medium Risk (08/31/2024)   SDOH Interventions:     Readmission Risk Interventions    09/03/2024    1:17 PM  Readmission Risk Prevention Plan  Transportation Screening Complete  PCP or Specialist Appt within 3-5 Days --  HRI or Home Care Consult Complete  Palliative Care Screening Not Applicable  Medication Review (RN Care Manager) Complete

## 2024-09-16 NOTE — TOC Progression Note (Signed)
 Transition of Care [facility]) - Progression Note    Patient Details  Name: Kirk Robinson MRN: [id number] Date of Birth: [birth year]  Transition of Care [facility]) CM/SW Contact  Nathanael CHRISTELLA Ring, RN Phone Number: 09/16/2024, 12:51 PM  Clinical Narrative:    CM spoke with patient's wife via phone, she reports that the wheelchair they got at last admission was too heavy for her to lift so they sent it back to [employer].  She would like instead a transport chair.  Reached out to [place] who said we can order a transport chair and put in the narrative for wheelchair and he can start working on it- plan for DC tomorrow.      Expected Discharge Plan: Home w Home Health Services Barriers to Discharge: Continued Medical Work up               Expected Discharge Plan and Services     Post Acute Care Choice: Home Health Living arrangements for the past 2 months: Single Family Home                           HH Arranged: RN, PT, OT [facility] Agency: [facility] Date [facility] Agency Contacted: 09/16/24 Time HH Agency Contacted: [phone]     Social Drivers of Health (SDOH) Interventions SDOH Screenings   Food Insecurity: No Food Insecurity (09/15/2024)  Housing: Unknown (09/15/2024)  Transportation Needs: No Transportation Needs (09/15/2024)  Utilities: Not At Risk (09/15/2024)  Depression (PHQ2-9): Medium Risk (01/19/2022)  Financial Resource Strain: Low Risk  (03/05/2024)   Received from [facility]  Social Connections: Socially Isolated (09/15/2024)  Tobacco Use: Medium Risk (08/31/2024)    Readmission Risk Interventions    09/03/2024    1:17 PM  Readmission Risk Prevention Plan  Transportation Screening Complete  PCP or Specialist Appt within 3-5 Days --  HRI or Home Care Consult Complete  Palliative Care Screening Not Applicable  Medication Review (RN Care Manager) Complete

## 2024-09-16 NOTE — TOC CM/SW Note (Signed)
 Patient suffers from COPD, pulmonary fibrosis, which impairs their ability to perform daily activities like bathing, dressing, and toileting in the home.  A walker will not resolve  issue with performing activities of daily living. A wheelchair will allow patient to safely perform daily activities. Patient is not able to propel themselves in the home using a standard weight wheelchair due to general weakness. Patient can self propel in the lightweight wheelchair/ transport chair.. Length of need Lifetime.

## 2024-09-17 DIAGNOSIS — J9621 Acute and chronic respiratory failure with hypoxia: Secondary | ICD-10-CM

## 2024-09-17 DIAGNOSIS — W19XXXS Unspecified fall, sequela: Secondary | ICD-10-CM | POA: Diagnosis not present

## 2024-09-17 DIAGNOSIS — G9340 Encephalopathy, unspecified: Secondary | ICD-10-CM | POA: Diagnosis not present

## 2024-09-17 LAB — MAGNESIUM: Magnesium: 2.1 mg/dL (ref 1.7–2.4)

## 2024-09-17 LAB — GLUCOSE, CAPILLARY
Glucose-Capillary: 104 mg/dL — ABNORMAL HIGH (ref 70–99)
Glucose-Capillary: 122 mg/dL — ABNORMAL HIGH (ref 70–99)

## 2024-09-17 MED ORDER — PREDNISONE 10 MG PO TABS: 10.0000 mg | ORAL_TABLET | Freq: Every day | ORAL | 0 refills | Status: AC

## 2024-09-17 NOTE — Discharge Summary (Addendum)
 " Physician Discharge Summary   Patient: Kirk Robinson MRN: [id number] DOB: 09/28/1955  Admit date:     09/15/2024  Discharge date: 09/17/24  Discharge Physician: Leita Blanch   PCP: Claudene Rayfield HERO, MD   Recommendations at discharge:    F/u PCP in 1-2 weeks  Discharge Diagnoses: Principal Problem:   AMS (altered mental status) Active Problems:   Fall   Acute kidney injury superimposed on CKD   Acute encephalopathy   Sepsis secondary to UTI (HCC)  Kirk Robinson is a 69 y.o. 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    Wife reported and patient started to appear lethargic since yesterday and has been eating and drinking very little and she wondering whether patient was dehydrated. And this morning, patient went to bathroom and fell down on the floor.    Acute metabolic encephalopathy--improving Sepsis as evidenced by leukocytosis, elevated lactic acid, source of infection clinically suspect there is a UTI. - received IV fluid - received Ceftriaxone  x3 doses - Urine culture --no growth --pt back to baseline  Hypomagnesemia - IV replacement - Recheck level tomorrow    COPD Chronic Chronic Pulmonary fibrosis Chronic hypoxic and hypercapnic respiratory failure/Chronic home oxygen - VBG showed compensated pH -- Continue current breathing treatment -- Continue Jascayd  --follows with Dr theotis   RA - Stable, continue steroid daily and 130 Cimzia injection  BPD --cont flomax  --good uop   Anxiety/depression - Continue SSRI,Valium    IIDM - Hold off metformin  while patient has elevated lactic acid  level--resumed at d/c   Acute on CKD stage IIIa - Volume contracted, on IV fluid - Creatinine level stable   History of hydrocephalus - VP shunt stable on image study   PAF - In sinus rhythm - Not on anticoagulation,   Deconditioning - PT evaluation--HHPT  Overall medically back to baseline. D/c home   DVT prophylaxis: Lovenox  Code Status: Full code Family Communication: Wife at bedside  disposition Plan: home with [facility]     Pain control - [place]  Controlled Substance Reporting System database was reviewed. and patient was instructed, not to drive, operate heavy machinery, perform activities at heights, swimming or participation in water activities or provide baby-sitting services while on Pain, Sleep and Anxiety Medications; until their outpatient Physician has advised to do so again. Also recommended to not to take more than prescribed Pain, Sleep and Anxiety Medications.  Diet recommendation:  Cardiac and Carb modified diet DISCHARGE MEDICATION: Allergies as of 09/17/2024       Reactions   Advair Diskus [fluticasone-salmeterol] Shortness Of Breath   Erythromycin Nausea And Vomiting   Iodinated Contrast Media Shortness Of Breath, Other (See Comments)   Ativan  [lorazepam ] Other (See Comments)   Caused pt's heart to stop   Cephalexin Other (See Comments)   Heart Burn   Darvocet [propoxyphene N-acetaminophen ] Nausea And Vomiting   Doxycycline Hyclate Nausea And Vomiting, Swelling, Other (See Comments)   Tongue swelling, severe depression   Hydroxychloroquine Other (See Comments)   Restless leg   Propoxyphene Nausea And Vomiting   Septra [sulfamethoxazole-trimethoprim] Rash        Medication List     TAKE these medications    rOPINIRole  0.5 MG  tablet Commonly known as: REQUIP  Take 0.5 mg by mouth at bedtime. The timing of this medication is very important.   Accu-Chek FastClix Lancets Misc Apply topically daily.   Accu-Chek Guide w/Device Kit See admin  instructions.   albuterol  108 (90 Base) MCG/ACT inhaler Commonly known as: VENTOLIN  HFA Inhale 2 puffs into the lungs every 6 (six) hours as needed for wheezing or shortness of breath.   albuterol  (2.5 MG/3ML) 0.083% nebulizer solution Commonly known as: PROVENTIL  Inhale 3 mLs (2.5 mg total) into the lungs every 6 (six) hours as needed for wheezing. Home med.   ARIPiprazole  5 MG tablet Commonly known as: ABILIFY  Take 5 mg by mouth daily.   Aspirin  Low Dose 81 MG tablet Generic drug: aspirin  EC TAKE 1 TABLET BY MOUTH EVERY DAY   atorvastatin  20 MG tablet Commonly known as: LIPITOR Take 1 tablet (20 mg total) by mouth daily at 6 PM.   Breztri  Aerosphere 160-9-4.8 MCG/ACT Aero inhaler Generic drug: budesonide -glycopyrrolate -formoterol  SMARTSIG:2 Inhalation Via Inhaler Twice Daily   clopidogrel  75 MG tablet Commonly known as: PLAVIX  Take 1 tablet (75 mg total) by mouth daily.   diazepam  5 MG tablet Commonly known as: VALIUM  Take 5 mg by mouth every 12 (twelve) hours.   Docusate Sodium  100 MG capsule Take 100 mg by mouth in the morning, at noon, and at bedtime.   Ensure Max Protein Liqd Take 330 mLs (11 oz total) by mouth 2 (two) times daily.   glucose blood test strip See admin instructions.   ipratropium 0.03 % nasal spray Commonly known as: ATROVENT  Place 1 spray into the nose 2 (two) times daily as needed for rhinitis.   ipratropium-albuterol  0.5-2.5 (3) MG/3ML Soln Commonly known as: DUONEB Inhale 3 mLs into the lungs 4 (four) times daily.   Jascayd  18 MG tablet Generic drug: nerandomilast  Take 18 mg by mouth in the morning and at bedtime.   levothyroxine  100 MCG tablet Commonly known as: SYNTHROID  Take 100 mcg by mouth every morning.   metFORMIN  1000 MG tablet Commonly known as: GLUCOPHAGE  TAKE 1 TABLET BY MOUTH 2 TIMES DAILY WITH MEAL. What changed: See the new instructions.   metoprolol  succinate 25 MG 24 hr tablet Commonly known as: TOPROL -XL Take  12.5 mg by mouth daily.   mirtazapine  45 MG tablet Commonly known as: REMERON  Take 45 mg by mouth at bedtime.   morphine  30 MG 12 hr tablet Commonly known as: MS CONTIN  Take 30 mg by mouth every 12 (twelve) hours.   naloxone  4 MG/0.1ML Liqd nasal spray kit Commonly known as: NARCAN  Place into the nose.   Ohtuvayre  3 MG/2.5ML Susp Generic drug: Ensifentrine  Inhale 1 Inhalation into the lungs in the morning and at bedtime.   pantoprazole  40 MG tablet Commonly known as: PROTONIX  Take 40 mg by mouth 2 (two) times daily before a meal.   predniSONE  10 MG tablet Commonly known as: DELTASONE  Take 1 tablet (10 mg total) by mouth daily with breakfast. Start taking on: September 18, 2024 What changed:  how much to take how to take this when to take this additional instructions   tamsulosin  0.4 MG Caps capsule Commonly known as: FLOMAX  Take 2 capsules (0.8 mg total) by mouth daily after supper.   venlafaxine  XR 150 MG 24 hr capsule Commonly known as: EFFEXOR -XR Take 1 capsule (150 mg total) by mouth daily with breakfast.   Vitamin D  (Ergocalciferol ) 1.25 MG (50000 UNIT) Caps capsule Commonly known as: DRISDOL  Take 50,000 Units by mouth every 7 (  seven) days. On Sundays               Durable Medical Equipment  (From admission, onward)           Start     Ordered   09/16/24 1247  For home use only DME Other see comment  Once       Comments: Transport chair  Question:  Length of Need  Answer:  Lifetime   09/16/24 1246            Contact information for follow-up providers     Claudene Rayfield HERO, MD Follow up.   Specialty: Family Medicine Why: hospital follow up Contact information: [address] [place] [address] [phone]              Contact information for after-discharge care     Home Medical Care     [facility] .   Service: Home Health Services Contact information: [address] [place]  [address] [phone]                    Discharge Exam: Fredricka Weights   09/15/24 0908  Weight: 77.1 kg   GENERAL:  69 y.o.-year-old patient with no acute distress. Chronically ill LUNGS: decreased breath sounds bilaterally, no wheezing CARDIOVASCULAR: S1, S2 normal. No murmur   ABDOMEN: Soft, nontender, nondistended.  EXTREMITIES+ edema b/l.    NEUROLOGIC: nonfocal  patient is alert and awake  Condition at discharge: fair  The results of significant diagnostics from this hospitalization (including imaging, microbiology, ancillary and laboratory) are listed below for reference.   Imaging Studies: DG Skull 1-3 Views Result Date: 09/15/2024 EXAM: 1 or 2 or 3 VIEW(S) XRAY OF THE SKULL 09/15/2024 09:39:00 AM COMPARISON: CT head 09/15/2024 , skull series 05/14/2019. CLINICAL HISTORY: 69 year old male. Chronic cerebrospinal fluid (CSF) shunt, fall at home, evaluate for shunt malfunction. FINDINGS: BONES: No acute fracture. SINUSES: Unremarkable. SOFT TISSUES: Unremarkable. LINES AND TUBES: Unchanged radiographic appearance of left vertex approach ventriculostomy catheter, left convexity partially radiolucent shunt reservoir and tubing. IMPRESSION: 1. Stable appearance of left vertex ventriculostomy catheter, shunt reservoir, and tubing. Electronically signed by: Helayne Hurst MD 09/15/2024 09:53 AM EST RP Workstation: [id number]   DG Chest 1 View Result Date: 09/15/2024 EXAM: 1 VIEW(S) XRAY OF THE CHEST 09/15/2024 09:39:00 AM COMPARISON: 08/31/2024 CLINICAL HISTORY: 69 year old male. Evaluate for shunt malfunction. Chronic cerebrospinal fluid (CSF) shunt. Fall at home. FINDINGS: LINES, TUBES AND DEVICES: Shunt tubing over left chest. Stable appearance of left chest shunt tubing which is partially calcified. No kinking or discontinuity identified. LUNGS AND PLEURA: Stable chronic peripheral and basilar predominant interstitial opacities. No consolidation or pleural  effusion. No pneumothorax. HEART AND MEDIASTINUM: Aortic atherosclerosis. No acute abnormality of the cardiac and mediastinal silhouettes. BONES AND SOFT TISSUES: No acute osseous abnormality. IMPRESSION: 1. Stable left chest shunt tubing; no kinking or discontinuity. 2. Chronic interstitial lung disease appears stable. 3. Aortic Atherosclerosis (ICD10-I70.0). Electronically signed by: Helayne Hurst MD 09/15/2024 09:51 AM EST RP Workstation: [id number]   DG Abd 1 View Result Date: 09/15/2024 EXAM: 1 VIEW XRAY OF THE ABDOMEN 09/15/2024 09:39:00 AM COMPARISON: 04/27/2024 CLINICAL HISTORY: 69 year old male with chronic CSF shunt, fall at home. FINDINGS: LINES, TUBES AND DEVICES: Shunt catheter tubing in left abdomen. Left iliac artery stent noted. BOWEL: Nonobstructed bowel gas pattern. Moderate colonic stool burden. SOFT TISSUES: Calcified atherosclerosis. No abnormal calcifications. BONES: No acute fracture. IMPRESSION: 1. Shunt catheter  terminates left central abdomen without visible kinking or discontinuity. 2. Non-obstructed bowel gas pattern with moderate colonic stool burden. Electronically signed by: Helayne Hurst MD 09/15/2024 09:50 AM EST RP Workstation: [id number]   DG Cervical Spine 1 View Result Date: 09/15/2024 EXAM: 1 AP VIEW XRAY OF THE CERVICAL SPINE 09/15/2024 09:39:00 AM COMPARISON: Neck radiographs 03/13/2015. CLINICAL HISTORY: 69 year old male with altered behavior, chronic cerebrospinal fluid (CSF) shunt, and history of fall at home. FINDINGS: BONES: Vertebral body heights are maintained. Alignment is normal. No acute osseous abnormality. DISCS AND DEGENERATIVE CHANGES: Chronic cervical spine degeneration. SOFT TISSUES: Left neck and upper chest shunt catheter is partially calcified, with a slightly more irregular course, but no kinking or discontinuity identified. No prevertebral soft tissue swelling. Chronic apical lung interstitial changes are advanced but stable. IMPRESSION: 1. Left shunt  catheter partially calcified but without visible kinking or discontinuity. 2. Chronic interstitial lung disease. Electronically signed by: Helayne Hurst MD 09/15/2024 09:46 AM EST RP Workstation: [id number]   CT Cervical Spine Wo Contrast Result Date: 09/15/2024 EXAM: CT CERVICAL SPINE WITHOUT CONTRAST 09/15/2024 09:16:00 AM TECHNIQUE: CT of the cervical spine was performed without the administration of intravenous contrast. Multiplanar reformatted images are provided for review. Automated exposure control, iterative reconstruction, and/or weight based adjustment of the mA/kV was utilized to reduce the radiation dose to as low as reasonably achievable. COMPARISON: CT head reported separately today. CT cervical spine 12/11/2023. CLINICAL HISTORY: 69 year old male with lethargy for 2-3 days, decreased urination, unobserved fall at home, and pain. FINDINGS: BONES AND ALIGNMENT: Chronic reversal of lower cervical lordosis is stable. Chronic mild dextroconvex cervical scoliosis. No acute osseous abnormality. Mild motion artifact. DEGENERATIVE CHANGES: Evidence of developing degenerative interbody ankylosis at both C6-C7 and C7-T1. Superimposed chronic degenerative facet ankylosis on the left at C5-C6. Widespread additional left side facet arthropathy. Chronic severe anterior C1-C2 degeneration. SOFT TISSUES: Stable visible non-contrast neck soft tissues including left side subcutaneous CSF shunt. Calcified aortic arch atherosclerosis and chronic apical lung disease visible at the thoracic inlet. Suggestion of combined chronic emphysema and pulmonary fibrosis. No prevertebral soft tissue swelling. IMPRESSION: 1. No acute traumatic injury identified in the cervical spine. 2. Chronic cervical spine degeneration and ankylosis appears stable from last year. 3. Chronic apical lung disease. Electronically signed by: Helayne Hurst MD 09/15/2024 09:32 AM EST RP Workstation: [id number]   CT Head Wo Contrast Result Date:  09/15/2024 EXAM: CT HEAD WITHOUT CONTRAST 09/15/2024 09:16:00 AM TECHNIQUE: CT of the head was performed without the administration of intravenous contrast. Automated exposure control, iterative reconstruction, and/or weight based adjustment of the mA/kV was utilized to reduce the radiation dose to as low as reasonably achievable. COMPARISON: Brain MRI 05/14/2019, Head CT 12/11/2023. CLINICAL HISTORY: 69 year old male. Lethargy for 2 to 3 days, decreased urination, unobserved fall at home, pain. FINDINGS: BRAIN AND VENTRICLES: No acute hemorrhage. No evidence of acute infarct. Decompressed and slit-like ventricles in the setting of chronic CSF shunt, not significantly changed from previous exams. Intracranial shunt position stable, crossing midline and terminating at the right caudothalamic groove area. Seen confluent stable chronic nonspecific cerebral white matter hypodensity most pronounced in the right superior frontal gyrus (coronal image 27). Stable gray-white differentiation. Background brain volume is normal for age. No extra-axial collection. No mass effect or midline shift. No suspicious intracranial vascular hyperdensity. Calcified atherosclerosis at the skull base. ORBITS: No acute abnormality. SINUSES: Paranasal sinuses, tympanic cavities, and mastoids remain well aerated. SOFT TISSUES AND SKULL: Chronic left vertex approach CSF shunt. Stable scalp shunt  reservoir and tubing. Stable left vertex skull burr hole. Partially visible advanced upper cervical spine degeneration. No acute soft tissue abnormality. No skull fracture. IMPRESSION: 1. No acute intracranial abnormality or acute traumatic injury identified. 2. Stable chronic CSF shunt and slit-like ventricles. 3. Stable chronic nonspecific white matter disease. Electronically signed by: Helayne Hurst MD 09/15/2024 09:28 AM EST RP Workstation: [id number]   US  Renal Result Date: 08/31/2024 EXAM: RETROPERITONEAL ULTRASOUND OF THE KIDNEYS 08/31/2024  10:34:32 PM TECHNIQUE: Real-time ultrasonography of the retroperitoneum, specifically the kidneys and urinary bladder, was performed. COMPARISON: None available. CLINICAL HISTORY: Evaluate for hydronephrosis, AKI, unable to urinate for a day. FINDINGS: RIGHT KIDNEY: Right kidney measures 9.4 x 4.3 x 3.8 cm with a volume of 77 cc. Renal cortical thickness and echogenicity are within normal limits. No hydronephrosis. No intrarenal mass or calcification. LEFT KIDNEY: Left kidney measures 9.4 x 4.9 x 4.9 cm with a volume of 118 cc. Renal cortical thickness and echogenicity are within normal limits. No hydronephrosis. No intrarenal mass or calcification. BLADDER: Unremarkable appearance of the bladder. Bilateral ureteral jets were identified. IMPRESSION: 1. No hydronephrosis or intrarenal mass. Electronically signed by: Dorethia Molt MD MD 08/31/2024 11:05 PM EST RP Workstation: [id number]   DG Chest 2 View Result Date: 08/31/2024 EXAM: 2 VIEW(S) XRAY OF THE CHEST 08/31/2024 08:11:50 PM COMPARISON: 04/27/2024 CLINICAL HISTORY: shob FINDINGS: LINES, TUBES AND DEVICES: Partially visualized VP shunt coursing along left chest. LUNGS AND PLEURA: Redemonstration of coarse interstitial markings throughout bilateral lungs, unchanged from prior study, in keeping with changes of underlying interstitial lung disease that are seen on CT examination of 12/11/2023. No superimposed confluent pulmonary infiltrate. No pleural effusion. No pneumothorax. HEART AND MEDIASTINUM: No acute abnormality of the cardiac and mediastinal silhouettes. BONES AND SOFT TISSUES: No acute osseous abnormality. IMPRESSION: 1. No acute cardiopulmonary findings 2. Chronic coarse interstitial markings throughout the bilateral lungs, consistent with underlying interstitial lung disease. Electronically signed by: Dorethia Molt MD MD 08/31/2024 08:32 PM EST RP Workstation: [id number]    Microbiology: Results for orders placed or performed during the  hospital encounter of 09/15/24  Blood culture (routine x 2)     Status: None (Preliminary result)   Collection Time: 09/15/24  8:56 AM   Specimen: BLOOD  Result Value Ref Range Status   Specimen Description BLOOD BLOOD LEFT HAND  Final   Special Requests   Final    BOTTLES DRAWN AEROBIC AND ANAEROBIC Blood Culture results may not be optimal due to an inadequate volume of blood received in culture bottles   Culture   Final    NO GROWTH 2 DAYS Performed at [facility], [address] [place], [place] [address]    Report Status PENDING  Incomplete  Blood culture (routine x 2)     Status: None (Preliminary result)   Collection Time: 09/15/24  8:56 AM   Specimen: BLOOD  Result Value Ref Range Status   Specimen Description BLOOD BLOOD RIGHT HAND  Final   Special Requests   Final    BOTTLES DRAWN AEROBIC AND ANAEROBIC Blood Culture results may not be optimal due to an inadequate volume of blood received in culture bottles   Culture   Final    NO GROWTH 2 DAYS Performed at [facility], [address] [place], [place] [address]    Report Status PENDING  Incomplete  Urine Culture (for pregnant, neutropenic or urologic patients or patients with an indwelling urinary catheter)     Status: Abnormal (Preliminary result)  Collection Time: 09/15/24  9:56 AM   Specimen: Urine, Clean Catch  Result Value Ref Range Status   Specimen Description   Final    URINE, CLEAN CATCH Performed at [facility], [address], [place], [place] [address]    Special Requests   Final    NONE Performed at [facility], [address] [place], [place] [address]    Culture (A)  Final    40,000 COLONIES/mL STAPHYLOCOCCUS EPIDERMIDIS SUSCEPTIBILITIES TO FOLLOW Performed at [facility], [address] [place], [place] [address]    Report Status PENDING  Incomplete    Labs: CBC: Recent Labs  Lab 09/15/24 0856 09/16/24 0529  WBC 17.3* 7.6  NEUTROABS  13.8*  --   HGB 11.7* 10.1*  HCT 34.8* 31.2*  MCV 90.6 93.4  PLT 249 217   Basic Metabolic Panel: Recent Labs  Lab 09/15/24 0856 09/16/24 0529 09/17/24 0521  NA 139 137  --   K 4.1 3.8  --   CL 97* 100  --   CO2 30 29  --   GLUCOSE 138* 121*  --   BUN 29* 20  --   CREATININE 1.60* 1.19  --   CALCIUM  10.0 8.7*  --   MG 1.3*  --  2.1   Liver Function Tests: Recent Labs  Lab 09/15/24 0856  AST 20  ALT 15  ALKPHOS 73  BILITOT 0.3  PROT 7.1  ALBUMIN 4.0   CBG: Recent Labs  Lab 09/16/24 1155 09/16/24 1606 09/16/24 2113 09/17/24 0736 09/17/24 0751  GLUCAP 172* 180* 126* 104* 122*    Discharge time spent: greater than 30 minutes.  Signed: Leita Blanch, MD [facility] 09/17/2024 "

## 2024-09-17 NOTE — Plan of Care (Signed)
  Problem: Health Behavior/Discharge Planning: Goal: Ability to manage health-related needs will improve Outcome: Progressing   Problem: Clinical Measurements: Goal: Will remain free from infection Outcome: Progressing Goal: Respiratory complications will improve Outcome: Progressing Goal: Cardiovascular complication will be avoided Outcome: Progressing   

## 2024-09-17 NOTE — TOC Transition Note (Signed)
 Transition of Care [facility]) - Discharge Note   Patient Details  Name: Kirk Robinson MRN: [id number] Date of Birth: August 15, 1956  [facility] CM/SW Contact:  Nathanael CHRISTELLA Ring, RN Phone Number: 09/17/2024, 10:52 AM   Clinical Narrative:     Patient is medically cleared for discharge home with home health services.  Larraine with [facility] notified of discharge.  Transport chair delivered to the room yesterday and patient is being taken out in it, going to the discharge lounge to wait on his wife to pick him up.    Final next level of care: Skilled Nursing Facility Barriers to Discharge: Barriers Resolved   Patient Goals and CMS Choice   CMS Medicare.gov Compare Post Acute Care list provided to:: Patient Choice offered to / list presented to : Patient      Discharge Placement                  Name of family member notified: going home- patient and wife Patient and family notified of of transfer: 09/17/24  Discharge Plan and Services Additional resources added to the After Visit Summary for       Post Acute Care Choice: Home Health          DME Arranged: Other see comment [employer] Manager) DME Agency: [employer]       HH Arranged: RN, PT, OT HH Agency: [facility] Date HH Agency Contacted: 09/16/24 Time HH Agency Contacted: [phone]    Social Drivers of Health (SDOH) Interventions SDOH Screenings   Food Insecurity: No Food Insecurity (09/15/2024)  Housing: Unknown (09/15/2024)  Transportation Needs: No Transportation Needs (09/15/2024)  Utilities: Not At Risk (09/15/2024)  Depression (PHQ2-9): Medium Risk (01/19/2022)  Financial Resource Strain: Low Risk  (03/05/2024)   Received from [facility]  Social Connections: Socially Isolated (09/15/2024)  Tobacco Use: Medium Risk (08/31/2024)     Readmission Risk Interventions    09/03/2024    1:17 PM  Readmission Risk Prevention Plan  Transportation Screening Complete  PCP or Specialist  Appt within 3-5 Days --  HRI or Home Care Consult Complete  Palliative Care Screening Not Applicable  Medication Review (RN Care Manager) Complete

## 2024-09-18 LAB — URINE CULTURE: Culture: 40000 — AB

## 2024-09-20 LAB — CULTURE, BLOOD (ROUTINE X 2)
Culture: NO GROWTH
Culture: NO GROWTH

## 2024-09-25 ENCOUNTER — Other Ambulatory Visit: Payer: Self-pay

## 2024-09-25 ENCOUNTER — Encounter: Payer: Self-pay | Admitting: *Deleted

## 2024-09-25 DIAGNOSIS — E039 Hypothyroidism, unspecified: Secondary | ICD-10-CM | POA: Insufficient documentation

## 2024-09-25 DIAGNOSIS — D649 Anemia, unspecified: Secondary | ICD-10-CM | POA: Insufficient documentation

## 2024-09-25 DIAGNOSIS — J449 Chronic obstructive pulmonary disease, unspecified: Secondary | ICD-10-CM | POA: Insufficient documentation

## 2024-09-25 DIAGNOSIS — N179 Acute kidney failure, unspecified: Secondary | ICD-10-CM | POA: Insufficient documentation

## 2024-09-25 DIAGNOSIS — E119 Type 2 diabetes mellitus without complications: Secondary | ICD-10-CM | POA: Insufficient documentation

## 2024-09-25 NOTE — ED Notes (Signed)
 Bladder scan done in triage 154 mls by [facility]

## 2024-09-25 NOTE — ED Triage Notes (Signed)
 Pt to triage via wheelchair.  Pt reports urinary retention.  Pt states last voided this morning.  Pt alert.  Pt on 4 liters oxygen [place].

## 2024-09-26 ENCOUNTER — Emergency Department
Admission: EM | Admit: 2024-09-26 | Discharge: 2024-09-26 | Disposition: A | Attending: Emergency Medicine | Admitting: Emergency Medicine

## 2024-09-26 DIAGNOSIS — N179 Acute kidney failure, unspecified: Secondary | ICD-10-CM

## 2024-09-26 DIAGNOSIS — R339 Retention of urine, unspecified: Secondary | ICD-10-CM

## 2024-09-26 LAB — URINALYSIS, ROUTINE W REFLEX MICROSCOPIC
Bilirubin Urine: NEGATIVE
Glucose, UA: NEGATIVE mg/dL
Hgb urine dipstick: NEGATIVE
Ketones, ur: NEGATIVE mg/dL
Leukocytes,Ua: NEGATIVE
Nitrite: NEGATIVE
Protein, ur: 30 mg/dL — AB
Specific Gravity, Urine: 1.025 (ref 1.005–1.030)
pH: 5 (ref 5.0–8.0)

## 2024-09-26 LAB — COMPREHENSIVE METABOLIC PANEL WITH GFR
ALT: 14 U/L (ref 0–44)
AST: 15 U/L (ref 15–41)
Albumin: 4.1 g/dL (ref 3.5–5.0)
Alkaline Phosphatase: 72 U/L (ref 38–126)
Anion gap: 16 — ABNORMAL HIGH (ref 5–15)
BUN: 39 mg/dL — ABNORMAL HIGH (ref 8–23)
CO2: 27 mmol/L (ref 22–32)
Calcium: 9.4 mg/dL (ref 8.9–10.3)
Chloride: 99 mmol/L (ref 98–111)
Creatinine, Ser: 1.75 mg/dL — ABNORMAL HIGH (ref 0.61–1.24)
GFR, Estimated: 42 mL/min — ABNORMAL LOW
Glucose, Bld: 76 mg/dL (ref 70–99)
Potassium: 4.5 mmol/L (ref 3.5–5.1)
Sodium: 142 mmol/L (ref 135–145)
Total Bilirubin: 0.2 mg/dL (ref 0.0–1.2)
Total Protein: 7.1 g/dL (ref 6.5–8.1)

## 2024-09-26 LAB — CBC WITH DIFFERENTIAL/PLATELET
Abs Immature Granulocytes: 0.12 10*3/uL — ABNORMAL HIGH (ref 0.00–0.07)
Basophils Absolute: 0 10*3/uL (ref 0.0–0.1)
Basophils Relative: 0 %
Eosinophils Absolute: 0.1 10*3/uL (ref 0.0–0.5)
Eosinophils Relative: 1 %
HCT: 32.8 % — ABNORMAL LOW (ref 39.0–52.0)
Hemoglobin: 10.6 g/dL — ABNORMAL LOW (ref 13.0–17.0)
Immature Granulocytes: 2 %
Lymphocytes Relative: 25 %
Lymphs Abs: 2 10*3/uL (ref 0.7–4.0)
MCH: 30.1 pg (ref 26.0–34.0)
MCHC: 32.3 g/dL (ref 30.0–36.0)
MCV: 93.2 fL (ref 80.0–100.0)
Monocytes Absolute: 0.4 10*3/uL (ref 0.1–1.0)
Monocytes Relative: 5 %
Neutro Abs: 5.4 10*3/uL (ref 1.7–7.7)
Neutrophils Relative %: 67 %
Platelets: 243 10*3/uL (ref 150–400)
RBC: 3.52 MIL/uL — ABNORMAL LOW (ref 4.22–5.81)
RDW: 14.4 % (ref 11.5–15.5)
WBC: 8 10*3/uL (ref 4.0–10.5)
nRBC: 0 % (ref 0.0–0.2)

## 2024-09-26 MED ORDER — SODIUM CHLORIDE 0.9 % IV BOLUS (SEPSIS)
1000.0000 mL | Freq: Once | INTRAVENOUS | Status: AC
  Administered 2024-09-26: 1000 mL via INTRAVENOUS

## 2024-09-26 NOTE — ED Provider Notes (Signed)
 "  [facility] Provider Note    Event Date/Time   First MD Initiated Contact with Patient 09/26/24 0114     (approximate)   History   Urinary Retention   HPI  Kirk Robinson is a 69 y.o. male with history of alcohol use disorder, COPD on home oxygen, diabetes, hypothyroidism, rheumatoid arthritis who presents to the emergency department with urinary retention.  Has not been able to urinate since this morning.  Has had to have previous Foley catheters.  Denies dysuria, hematuria, fever, flank pain, vomiting.  Has abdominal discomfort.   History provided by patient and wife.    Past Medical History:  Diagnosis Date   Alcohol use 03/16/2007   patient risk factors   Allergic rhinitis, cause unspecified    Altered mental status    Anxiety state, unspecified    Cervicalgia 09/17/2007   COPD (chronic obstructive pulmonary disease) (HCC)    Diabetes mellitus without complication (HCC)    Diplopia 12/21/2007   Esophageal reflux    Gastritis 05/13/2009   HCAP (healthcare-associated pneumonia) 08/12/2014   Hematuria 03/16/2007   Hyperthyroidism    graves disease   Iodine  hypothyroidism    Memory loss    NSIP (nonspecific interstitial pneumonia) (HCC)    on azathioprine    Osteoporosis, unspecified    bone density per Morivati in 2012   Other abnormal glucose 04/08/2008   Other diseases of lung, not elsewhere classified 01/23/2009   s/p Fleming/pulmonology consult; intolerant  to Advair   Peripheral vascular disease, unspecified 12/24/2009   Personal history of colonic polyps    Pneumonia, organism unspecified(486)    Pure hypercholesterolemia    Rheumatoid arthritis (HCC)    Shortness of breath    Stroke (HCC) 08/23/2003   Tobacco use disorder 03/16/2007   patient risk factors   Unspecified hypothyroidism 01/07/2010   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  CT head negative, MRI brain negative, EEG: diffuse slowing but no  seizure acitivity.  Labs negative.  [facility].   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   COLONOSCOPY WITH PROPOFOL  N/A 03/10/2020   Procedure: COLONOSCOPY WITH PROPOFOL ;  Surgeon: Therisa Bi, MD;  Location: [facility] ENDOSCOPY;  Service: Gastroenterology;  Laterality: N/A;   COLONOSCOPY WITH PROPOFOL  N/A 03/16/2021   Procedure: COLONOSCOPY WITH PROPOFOL ;  Surgeon: Therisa Bi, MD;  Location: [facility] ENDOSCOPY;  Service: Gastroenterology;  Laterality: N/A;   COLONOSCOPY WITH PROPOFOL  N/A 06/22/2022   Procedure: COLONOSCOPY WITH PROPOFOL ;  Surgeon: Therisa Bi, MD;  Location: [facility] ENDOSCOPY;  Service: Endoscopy;  Laterality: N/A;   ESOPHAGOGASTRODUODENOSCOPY (EGD) WITH PROPOFOL  N/A 03/16/2021   Procedure: ESOPHAGOGASTRODUODENOSCOPY (EGD) WITH PROPOFOL ;  Surgeon: Therisa Bi, MD;  Location: [facility] ENDOSCOPY;  Service: Gastroenterology;  Laterality: N/A;   EYE SURGERY  2010   GIVENS CAPSULE STUDY N/A 04/16/2021   Procedure: GIVENS CAPSULE STUDY;  Surgeon: Therisa Bi, MD;  Location: [facility] ENDOSCOPY;  Service: Gastroenterology;  Laterality: N/A;   LAPAROSCOPIC REVISION VENTRICULAR-PERITONEAL (V-P) SHUNT Right 05/20/2014   Procedure: LAPAROSCOPIC REVISION VENTRICULAR-PERITONEAL (V-P) SHUNT;  Surgeon: Dann Hummer, MD;  Location: [facility] NEURO ORS;  Service: General;  Laterality: Right;   LOWER EXTREMITY ANGIOGRAPHY Left 06/11/2020   Procedure: LOWER EXTREMITY ANGIOGRAPHY;  Surgeon: Marea Selinda RAMAN, MD;  Location: [facility] INVASIVE CV LAB;  Service: Cardiovascular;  Laterality: Left;   Neuropsychiatric evaluation  04/2012  poor short-term memory, poor recall, delayed reaction time; permanently disabled.      MEDICATIONS:  Prior to Admission medications  Medication Sig Start Date End Date Taking? Authorizing Provider  Accu-Chek FastClix Lancets MISC Apply topically daily. 03/09/21    [provider]  albuterol  (PROVENTIL ) (2.5 MG/3ML) 0.083% nebulizer solution Inhale 3 mLs (2.5 mg total) into the lungs every 6 (six) hours as needed for wheezing. Home med. 09/04/24   Awanda City, MD  albuterol  (VENTOLIN  HFA) 108 [phone] Base) MCG/ACT inhaler Inhale 2 puffs into the lungs every 6 (six) hours as needed for wheezing or shortness of breath. 05/21/19   Josette Ade, MD  ARIPiprazole  (ABILIFY ) 5 MG tablet Take 5 mg by mouth daily.    [provider]  ASPIRIN  LOW DOSE 81 MG tablet TAKE 1 TABLET BY MOUTH EVERY DAY 01/25/24   Dew, Jason S, MD  atorvastatin  (LIPITOR) 20 MG tablet Take 1 tablet (20 mg total) by mouth daily at 6 PM. 10/24/17   Smith, Kristi M, MD  Blood Glucose Monitoring Suppl (ACCU-CHEK GUIDE) w/Device KIT See admin instructions. 05/22/19   [provider]  BREZTRI  AEROSPHERE 160-9-4.8 MCG/ACT AERO SMARTSIG:2 Inhalation Via Inhaler Twice Daily 05/11/21   [provider]  clopidogrel  (PLAVIX ) 75 MG tablet Take 1 tablet (75 mg total) by mouth daily. 05/20/24   Brown, Fallon E, NP  diazepam  (VALIUM ) 5 MG tablet Take 5 mg by mouth every 12 (twelve) hours. 05/26/21   [provider]  Docusate Sodium  100 MG capsule Take 100 mg by mouth in the morning, at noon, and at bedtime.    [provider]  Ensifentrine  (OHTUVAYRE ) 3 MG/2.5ML SUSP Inhale 1 Inhalation into the lungs in the morning and at bedtime. Patient not taking: Reported on 09/15/2024    [provider]  Ensure Max Protein (ENSURE MAX PROTEIN) LIQD Take 330 mLs (11 oz total) by mouth 2 (two) times daily. 05/21/19   Josette Ade, MD  glucose blood test strip See admin instructions. 06/18/21   [provider]  ipratropium (ATROVENT ) 0.03 % nasal spray Place 1 spray into the nose 2 (two) times daily as needed for rhinitis. 11/28/23 11/27/24  [provider]  ipratropium-albuterol  (DUONEB) 0.5-2.5 (3) MG/3ML SOLN Inhale 3 mLs into the lungs 4 (four) times  daily. Patient not taking: Reported on 09/15/2024 12/07/23 12/01/24  [provider]  JASCAYD  18 MG tablet Take 18 mg by mouth in the morning and at bedtime.    [provider]  levothyroxine  (SYNTHROID ) 100 MCG tablet Take 100 mcg by mouth every morning.    [provider]  metFORMIN  (GLUCOPHAGE ) 1000 MG tablet TAKE 1 TABLET BY MOUTH 2 TIMES DAILY WITH MEAL. Patient taking differently: Take 1,000 mg by mouth 2 (two) times daily with a meal. 03/16/18   Claudene Rayfield HERO, MD  metoprolol  succinate (TOPROL -XL) 25 MG 24 hr tablet Take 12.5 mg by mouth daily. 04/22/21   [provider]  mirtazapine  (REMERON ) 45 MG tablet Take 45 mg by mouth at bedtime. 04/04/23   [provider]  morphine  (MS CONTIN ) 30 MG 12 hr tablet Take 30 mg by mouth every 12 (twelve) hours. 02/05/21   [provider]  naloxone  (NARCAN ) nasal spray 4 mg/0.1 mL Place into the nose.    [provider]  pantoprazole  (PROTONIX ) 40 MG tablet Take 40 mg by mouth 2 (two) times daily before a meal. 01/20/23   [provider]  predniSONE  (DELTASONE ) 10 MG tablet Take 1  tablet (10 mg total) by mouth daily with breakfast. 09/18/24   Tobie Calix, MD  rOPINIRole  (REQUIP ) 0.5 MG tablet Take 0.5 mg by mouth at bedtime. 09/23/19 09/15/24  [provider]  tamsulosin  (FLOMAX ) 0.4 MG CAPS capsule Take 2 capsules (0.8 mg total) by mouth daily after supper. 06/13/24   Francisca Redell BROCKS, MD  venlafaxine  XR (EFFEXOR -XR) 150 MG 24 hr capsule Take 1 capsule (150 mg total) by mouth daily with breakfast. 01/24/18   Claudene Rayfield HERO, MD  Vitamin D , Ergocalciferol , (DRISDOL ) 1.25 MG (50000 UNIT) CAPS capsule Take 50,000 Units by mouth every 7 (seven) days. On Sundays 07/09/20   [provider]    Physical Exam   Triage Vital Signs: ED Triage Vitals  Encounter Vitals Group     BP 09/25/24 2209 100/67     Girls Systolic BP Percentile --      Girls Diastolic BP Percentile --      Boys  Systolic BP Percentile --      Boys Diastolic BP Percentile --      Pulse Rate 09/25/24 2209 (!) 111     Resp 09/25/24 2209 20     Temp 09/25/24 2209 98.1 F (36.7 C)     Temp Source 09/25/24 2209 Oral     SpO2 09/25/24 2209 96 %     Weight 09/25/24 2208 172 lb (78 kg)     Height 09/25/24 2208 5' 9 (1.753 m)     Head Circumference --      Peak Flow --      Pain Score 09/25/24 2207 8     Pain Loc --      Pain Education --      Exclude from Growth Chart --     Most recent vital signs: Vitals:   09/26/24 0208 09/26/24 0354  BP:  120/85  Pulse:  75  Resp:  16  Temp: 98.6 F (37 C) 98.6 F (37 C)  SpO2:  100%    CONSTITUTIONAL: Alert, responds appropriately to questions.  Chronically ill-appearing, appears uncomfortable HEAD: Normocephalic, atraumatic EYES: Conjunctivae clear, pupils appear equal, sclera nonicteric ENT: normal nose; moist mucous membranes NECK: Supple, normal ROM CARD: Regular and tachycardic; S1 and S2 appreciated RESP: Normal chest excursion without splinting or tachypnea; breath sounds clear and equal bilaterally; no wheezes, no rhonchi, no rales, no hypoxia or respiratory distress, speaking full sentences ABD/GI: Suprapubic tenderness with mild distention, no guarding or rebound BACK: The back appears normal EXT: Normal ROM in all joints; no deformity noted, no edema SKIN: Normal color for age and race; warm; no rash on exposed skin NEURO: Moves all extremities equally, normal speech PSYCH: The patient's mood and manner are appropriate.   ED Results / Procedures / Treatments   LABS: (all labs ordered are listed, but only abnormal results are displayed) Labs Reviewed  URINALYSIS, ROUTINE W REFLEX MICROSCOPIC - Abnormal; Notable for the following components:      Result Value   Color, Urine YELLOW (*)    APPearance HAZY (*)    Protein, ur 30 (*)    Bacteria, UA RARE (*)    All other components within normal limits  CBC WITH DIFFERENTIAL/PLATELET  - Abnormal; Notable for the following components:   RBC 3.52 (*)    Hemoglobin 10.6 (*)    HCT 32.8 (*)    Abs Immature Granulocytes 0.12 (*)    All other components within normal limits  COMPREHENSIVE METABOLIC PANEL WITH GFR - Abnormal; Notable for  the following components:   BUN 39 (*)    Creatinine, Ser 1.75 (*)    GFR, Estimated 42 (*)    Anion gap 16 (*)    All other components within normal limits     EKG:  EKG Interpretation Date/Time:    Ventricular Rate:    PR Interval:    QRS Duration:    QT Interval:    QTC Calculation:   R Axis:      Text Interpretation:           RADIOLOGY: My personal review and interpretation of imaging:    I have personally reviewed all radiology reports.   No results found.   PROCEDURES:  Critical Care performed: No     Procedures    IMPRESSION / MDM / ASSESSMENT AND PLAN / ED COURSE  I reviewed the triage vital signs and the nursing notes.    Patient here with urinary retention.  Approximately 400 mL seen on bladder scan.  History of the same.    DIFFERENTIAL DIAGNOSIS (includes but not limited to):   BPH causing urinary retention, UTI, AKI, less likely kidney stone, pyelonephritis, appendicitis, colitis, diverticulitis, bowel obstruction   Patient's presentation is most consistent with acute presentation with potential threat to life or bodily function.   PLAN: Labs, urine pending.  Will place Foley catheter.   MEDICATIONS GIVEN IN ED: Medications  sodium chloride  0.9 % bolus 1,000 mL (0 mLs Intravenous Stopped 09/26/24 0243)     ED COURSE: Patient reports feeling much better after Foley catheter placed and over 400 mL of straw-colored urine drained.  No gross hematuria.   Labs show no leukocytosis.  Chronic stable anemia.  He does have a creatinine of 1.75 which is new compared to 09/16/2024 when it was 1.19.  I suspect this is postobstructive in nature.  Will give a liter of fluid and have recommended  that he avoid NSAIDs and follow-up with his PCP to have this rechecked in 1 to 2 weeks.  His abdominal exam is now benign.  Urine shows no sign of infection today.  Will discharge with Foley catheter in place.  He has a urologist for follow-up.   At this time, I do not feel there is any life-threatening condition present. I reviewed all nursing notes, vitals, pertinent previous records.  All lab and urine results, EKGs, imaging ordered have been independently reviewed and interpreted by myself.  I reviewed all available radiology reports from any imaging ordered this visit.  Based on my assessment, I feel the patient is safe to be discharged home without further emergent workup and can continue workup as an outpatient as needed. Discussed all findings, treatment plan as well as usual and customary return precautions.  They verbalize understanding and are comfortable with this plan.  Outpatient follow-up has been provided as needed.  All questions have been answered.   CONSULTS:  none   OUTSIDE RECORDS REVIEWED: Reviewed recent admission.       FINAL CLINICAL IMPRESSION(S) / ED DIAGNOSES   Final diagnoses:  Urinary retention  AKI (acute kidney injury)     Rx / DC Orders   ED Discharge Orders     None        Note:  This document was prepared using Dragon voice recognition software and may include unintentional dictation errors.   Bjorn Hallas, Josette SAILOR, DO 09/26/24 [phone]  "

## 2024-09-26 NOTE — Discharge Instructions (Addendum)
 You may take over-the-counter Tylenol  1000 mg every 6 hours as needed for pain.  Please avoid NSAIDs such as aspirin , ibuprofen, Aleve given your elevated kidney function today.  Your kidney function also known as your creatinine was 1.75 today.  This is likely secondary to your urinary retention and should improve now that you have a Foley catheter in place and you received a liter of IV fluids here.  I recommend that you have this lab rechecked by your PCP in 1 to 2 weeks.  Please keep the Foley catheter in place and follow-up with Dr. Quilla as an outpatient.

## 2024-09-26 NOTE — ED Notes (Signed)
 Pt given urine leg bag and instructions for home care with teach back. No further questions.

## 2025-01-14 ENCOUNTER — Encounter (INDEPENDENT_AMBULATORY_CARE_PROVIDER_SITE_OTHER)

## 2025-01-14 ENCOUNTER — Ambulatory Visit (INDEPENDENT_AMBULATORY_CARE_PROVIDER_SITE_OTHER): Payer: PRIVATE HEALTH INSURANCE | Admitting: Vascular Surgery

## 2025-06-16 ENCOUNTER — Ambulatory Visit: Admitting: Urology
